# Patient Record
Sex: Male | Born: 1945 | Race: White | Hispanic: No | Marital: Single | Smoking: Never smoker
Health system: Southern US, Community
[De-identification: ages and names within clinical notes are randomized; demographics above are authoritative.]

## PROBLEM LIST (undated history)

## (undated) DIAGNOSIS — I255 Ischemic cardiomyopathy: Secondary | ICD-10-CM

## (undated) DIAGNOSIS — I739 Peripheral vascular disease, unspecified: Secondary | ICD-10-CM

## (undated) DIAGNOSIS — I4821 Permanent atrial fibrillation: Secondary | ICD-10-CM

## (undated) DIAGNOSIS — E785 Hyperlipidemia, unspecified: Secondary | ICD-10-CM

## (undated) DIAGNOSIS — N186 End stage renal disease: Secondary | ICD-10-CM

## (undated) DIAGNOSIS — I5022 Chronic systolic (congestive) heart failure: Secondary | ICD-10-CM

## (undated) DIAGNOSIS — I4891 Unspecified atrial fibrillation: Secondary | ICD-10-CM

## (undated) DIAGNOSIS — E119 Type 2 diabetes mellitus without complications: Secondary | ICD-10-CM

## (undated) DIAGNOSIS — I2510 Atherosclerotic heart disease of native coronary artery without angina pectoris: Secondary | ICD-10-CM

## (undated) DIAGNOSIS — I959 Hypotension, unspecified: Secondary | ICD-10-CM

## (undated) DIAGNOSIS — I2489 Other forms of acute ischemic heart disease: Secondary | ICD-10-CM

## (undated) DIAGNOSIS — I248 Other forms of acute ischemic heart disease: Secondary | ICD-10-CM

## (undated) HISTORY — PX: AV FISTULA REPAIR: SHX563

## (undated) HISTORY — PX: BELOW KNEE LEG AMPUTATION: SUR23

## (undated) HISTORY — PX: TOTAL HIP ARTHROPLASTY: SHX124

## (undated) HISTORY — PX: CHOLECYSTECTOMY: SHX55

---

## 2009-12-03 ENCOUNTER — Emergency Department: Payer: Self-pay | Admitting: Emergency Medicine

## 2012-11-14 ENCOUNTER — Emergency Department: Payer: Self-pay | Admitting: Emergency Medicine

## 2012-11-14 LAB — COMPREHENSIVE METABOLIC PANEL
Albumin: 4 g/dL (ref 3.4–5.0)
Bilirubin,Total: 1.6 mg/dL — ABNORMAL HIGH (ref 0.2–1.0)
Co2: 21 mmol/L (ref 21–32)
Creatinine: 3.99 mg/dL — ABNORMAL HIGH (ref 0.60–1.30)
EGFR (African American): 17 — ABNORMAL LOW
Glucose: 123 mg/dL — ABNORMAL HIGH (ref 65–99)
Osmolality: 289 (ref 275–301)
Potassium: 3.6 mmol/L (ref 3.5–5.1)
SGOT(AST): 22 U/L (ref 15–37)
SGPT (ALT): 29 U/L (ref 12–78)
Total Protein: 7.1 g/dL (ref 6.4–8.2)

## 2012-11-14 LAB — CBC
HGB: 15.2 g/dL (ref 13.0–18.0)
MCHC: 33.8 g/dL (ref 32.0–36.0)
RDW: 15.2 % — ABNORMAL HIGH (ref 11.5–14.5)
WBC: 8.3 10*3/uL (ref 3.8–10.6)

## 2012-11-14 LAB — URINALYSIS, COMPLETE
Bilirubin,UR: NEGATIVE
Glucose,UR: 150 mg/dL (ref 0–75)
Ketone: NEGATIVE
Ph: 6 (ref 4.5–8.0)
Protein: 100
Squamous Epithelial: 1
WBC UR: 3 /HPF (ref 0–5)

## 2012-11-15 LAB — LIPASE, BLOOD: Lipase: 130 U/L (ref 73–393)

## 2017-05-05 ENCOUNTER — Other Ambulatory Visit: Payer: Self-pay | Admitting: Internal Medicine

## 2018-04-24 DIAGNOSIS — N186 End stage renal disease: Secondary | ICD-10-CM | POA: Diagnosis not present

## 2018-04-24 DIAGNOSIS — Z794 Long term (current) use of insulin: Secondary | ICD-10-CM | POA: Insufficient documentation

## 2018-04-24 DIAGNOSIS — Z89512 Acquired absence of left leg below knee: Secondary | ICD-10-CM | POA: Insufficient documentation

## 2018-04-24 DIAGNOSIS — E875 Hyperkalemia: Secondary | ICD-10-CM | POA: Diagnosis not present

## 2018-04-24 DIAGNOSIS — Z992 Dependence on renal dialysis: Secondary | ICD-10-CM | POA: Diagnosis not present

## 2018-04-24 DIAGNOSIS — E1122 Type 2 diabetes mellitus with diabetic chronic kidney disease: Secondary | ICD-10-CM | POA: Insufficient documentation

## 2018-04-24 DIAGNOSIS — Z89511 Acquired absence of right leg below knee: Secondary | ICD-10-CM | POA: Insufficient documentation

## 2018-04-24 DIAGNOSIS — N390 Urinary tract infection, site not specified: Secondary | ICD-10-CM | POA: Diagnosis present

## 2018-04-24 DIAGNOSIS — I4891 Unspecified atrial fibrillation: Secondary | ICD-10-CM | POA: Diagnosis not present

## 2018-04-24 NOTE — ED Triage Notes (Signed)
Pt brought in via ems from [facility].  Pt bil bka.  Pt had dialysis today.  Pt reports dysuria.  No n/v/  Pt alert   Speech clear.

## 2018-04-25 ENCOUNTER — Other Ambulatory Visit: Payer: Self-pay

## 2018-04-25 ENCOUNTER — Encounter: Payer: Self-pay | Admitting: *Deleted

## 2018-04-25 ENCOUNTER — Observation Stay
Admission: EM | Admit: 2018-04-25 | Discharge: 2018-04-25 | Disposition: A | Discharge status: Home / Self Care | Payer: Medicare Other | Attending: Internal Medicine | Admitting: Internal Medicine

## 2018-04-25 DIAGNOSIS — N390 Urinary tract infection, site not specified: Secondary | ICD-10-CM | POA: Diagnosis present

## 2018-04-25 DIAGNOSIS — Z992 Dependence on renal dialysis: Secondary | ICD-10-CM

## 2018-04-25 DIAGNOSIS — N186 End stage renal disease: Secondary | ICD-10-CM

## 2018-04-25 DIAGNOSIS — E875 Hyperkalemia: Secondary | ICD-10-CM | POA: Diagnosis present

## 2018-04-25 DIAGNOSIS — E119 Type 2 diabetes mellitus without complications: Secondary | ICD-10-CM

## 2018-04-25 HISTORY — DX: Type 2 diabetes mellitus without complications: E11.9

## 2018-04-25 HISTORY — DX: End stage renal disease: N18.6

## 2018-04-25 HISTORY — DX: Unspecified atrial fibrillation: I48.91

## 2018-04-25 LAB — GLUCOSE, CAPILLARY
Glucose-Capillary: 78 mg/dL (ref 70–99)
Glucose-Capillary: 98 mg/dL (ref 70–99)

## 2018-04-25 LAB — URINALYSIS, COMPLETE (UACMP) WITH MICROSCOPIC
Bacteria, UA: NONE SEEN
Bilirubin Urine: NEGATIVE
Glucose, UA: 150 mg/dL — AB
Ketones, ur: NEGATIVE mg/dL
Nitrite: NEGATIVE
PH: 9 — AB (ref 5.0–8.0)
Protein, ur: 100 mg/dL — AB
Specific Gravity, Urine: 1.008 (ref 1.005–1.030)
WBC, UA: >50 WBC/hpf — ABNORMAL HIGH (ref 0–5)

## 2018-04-25 LAB — CBC
HCT: 32.9 % — ABNORMAL LOW (ref 39.0–52.0)
Hemoglobin: 10.2 g/dL — ABNORMAL LOW (ref 13.0–17.0)
MCH: 32 pg (ref 26.0–34.0)
MCHC: 31 g/dL (ref 30.0–36.0)
MCV: 103.1 fL — ABNORMAL HIGH (ref 80.0–100.0)
Platelets: 131 10*3/uL — ABNORMAL LOW (ref 150–400)
RBC: 3.19 MIL/uL — ABNORMAL LOW (ref 4.22–5.81)
RDW: 15.7 % — ABNORMAL HIGH (ref 11.5–15.5)
WBC: 7.9 10*3/uL (ref 4.0–10.5)
nRBC: 0 % (ref 0.0–0.2)

## 2018-04-25 LAB — BASIC METABOLIC PANEL
ANION GAP: 11 (ref 5–15)
Anion gap: 11 (ref 5–15)
BUN: 42 mg/dL — ABNORMAL HIGH (ref 8–23)
BUN: 46 mg/dL — ABNORMAL HIGH (ref 8–23)
CALCIUM: 9.4 mg/dL (ref 8.9–10.3)
CO2: 28 mmol/L (ref 22–32)
CO2: 26 mmol/L (ref 22–32)
Calcium: 9.4 mg/dL (ref 8.9–10.3)
Chloride: 99 mmol/L (ref 98–111)
Chloride: 100 mmol/L (ref 98–111)
Creatinine, Ser: 6.85 mg/dL — ABNORMAL HIGH (ref 0.61–1.24)
Creatinine, Ser: 7.04 mg/dL — ABNORMAL HIGH (ref 0.61–1.24)
GFR calc Af Amer: 8 mL/min — ABNORMAL LOW (ref >60)
GFR calc Af Amer: 8 mL/min — ABNORMAL LOW (ref >60)
GFR calc non Af Amer: 7 mL/min — ABNORMAL LOW (ref >60)
GFR, EST NON AFRICAN AMERICAN: 7 mL/min — AB (ref >60)
GLUCOSE: 87 mg/dL (ref 70–99)
Glucose, Bld: 108 mg/dL — ABNORMAL HIGH (ref 70–99)
Potassium: 7.3 mmol/L (ref 3.5–5.1)
Potassium: 7.1 mmol/L (ref 3.5–5.1)
Sodium: 138 mmol/L (ref 135–145)
Sodium: 137 mmol/L (ref 135–145)

## 2018-04-25 LAB — PROTIME-INR
INR: 2.02
Prothrombin Time: 22.6 seconds — ABNORMAL HIGH (ref 11.4–15.2)

## 2018-04-25 LAB — POTASSIUM: Potassium: 4.4 mmol/L (ref 3.5–5.1)

## 2018-04-25 MED ORDER — CEPHALEXIN 500 MG PO CAPS
500.0000 mg | ORAL_CAPSULE | Freq: Every day | ORAL | Status: DC
  Filled 2018-04-25: qty 1

## 2018-04-25 MED ORDER — ACETAMINOPHEN 325 MG PO TABS: 650.0000 mg | ORAL_TABLET | Freq: Four times a day (QID) | ORAL | Status: DC | PRN

## 2018-04-25 MED ORDER — ASPIRIN EC 81 MG PO TBEC: 81.0000 mg | DELAYED_RELEASE_TABLET | Freq: Every day | ORAL | Status: DC

## 2018-04-25 MED ORDER — DEXTROSE 50 % IV SOLN
25.0000 g | Freq: Once | INTRAVENOUS | Status: AC
  Administered 2018-04-25: 25 g via INTRAVENOUS
  Filled 2018-04-25: qty 50

## 2018-04-25 MED ORDER — INSULIN ASPART 100 UNIT/ML ~~LOC~~ SOLN
10.0000 [IU] | Freq: Once | SUBCUTANEOUS | Status: AC
  Administered 2018-04-25: 10 [IU] via INTRAVENOUS
  Filled 2018-04-25: qty 1

## 2018-04-25 MED ORDER — PREGABALIN 25 MG PO CAPS: 25.0000 mg | ORAL_CAPSULE | Freq: Every evening | ORAL | Status: DC

## 2018-04-25 MED ORDER — ONDANSETRON HCL 4 MG PO TABS: 4.0000 mg | ORAL_TABLET | Freq: Four times a day (QID) | ORAL | Status: DC | PRN

## 2018-04-25 MED ORDER — ACETAMINOPHEN 650 MG RE SUPP: 650.0000 mg | Freq: Four times a day (QID) | RECTAL | Status: DC | PRN

## 2018-04-25 MED ORDER — HEPARIN SODIUM (PORCINE) 5000 UNIT/ML IJ SOLN
5000.0000 [IU] | Freq: Three times a day (TID) | INTRAMUSCULAR | Status: DC
  Administered 2018-04-25: 5000 [IU] via SUBCUTANEOUS
  Filled 2018-04-25: qty 1

## 2018-04-25 MED ORDER — CHLORHEXIDINE GLUCONATE CLOTH 2 % EX PADS
6.0000 | MEDICATED_PAD | Freq: Every day | CUTANEOUS | Status: DC
  Filled 2018-04-25 (×2): qty 6

## 2018-04-25 MED ORDER — CEPHALEXIN 500 MG PO CAPS: 500.0000 mg | ORAL_CAPSULE | Freq: Every day | ORAL | 0 refills | Status: DC

## 2018-04-25 MED ORDER — SODIUM CHLORIDE 0.9 % IV SOLN: 1.0000 g | INTRAVENOUS | Status: DC

## 2018-04-25 MED ORDER — WARFARIN SODIUM 5 MG PO TABS
5.0000 mg | ORAL_TABLET | Freq: Once | ORAL | Status: DC
  Filled 2018-04-25: qty 2

## 2018-04-25 MED ORDER — HYDROCODONE-ACETAMINOPHEN 5-325 MG PO TABS: 1.0000 | ORAL_TABLET | Freq: Four times a day (QID) | ORAL | Status: DC | PRN

## 2018-04-25 MED ORDER — INSULIN ASPART 100 UNIT/ML ~~LOC~~ SOLN: 0.0000 [IU] | Freq: Every day | SUBCUTANEOUS | Status: DC

## 2018-04-25 MED ORDER — CEPHALEXIN 500 MG PO CAPS: 500.0000 mg | ORAL_CAPSULE | Freq: Every day | ORAL | 0 refills | Status: AC

## 2018-04-25 MED ORDER — ATORVASTATIN CALCIUM 20 MG PO TABS: 80.0000 mg | ORAL_TABLET | Freq: Every evening | ORAL | Status: DC

## 2018-04-25 MED ORDER — SODIUM CHLORIDE 0.9 % IV SOLN
1.0000 g | Freq: Once | INTRAVENOUS | Status: AC
  Administered 2018-04-25: 1 g via INTRAVENOUS
  Filled 2018-04-25: qty 10

## 2018-04-25 MED ORDER — PATIROMER SORBITEX CALCIUM 8.4 G PO PACK
8.4000 g | PACK | Freq: Every day | ORAL | Status: DC
  Filled 2018-04-25: qty 1

## 2018-04-25 MED ORDER — ONDANSETRON HCL 4 MG/2ML IJ SOLN: 4.0000 mg | Freq: Four times a day (QID) | INTRAMUSCULAR | Status: DC | PRN

## 2018-04-25 MED ORDER — WARFARIN - PHARMACIST DOSING INPATIENT
Freq: Every day | Status: DC
  Filled 2018-04-25: qty 1

## 2018-04-25 MED ORDER — INSULIN ASPART 100 UNIT/ML ~~LOC~~ SOLN: 0.0000 [IU] | Freq: Three times a day (TID) | SUBCUTANEOUS | Status: DC

## 2018-04-25 NOTE — Progress Notes (Signed)
Pre HD assessment    04/25/18 0931  Vital Signs  Temp 98.4 F (36.9 C)  Temp Source Oral  Pulse Rate 64  Pulse Rate Source Monitor  Resp 20  BP (!) 110/52  BP Location Right Arm  BP Method Automatic  Patient Position (if appropriate) Lying  Oxygen Therapy  SpO2 100 %  O2 Device Nasal Cannula  O2 Flow Rate (L/min) 2 L/min  Pain Assessment  Pain Scale 0-10  Pain Score 5  Pain Type Chronic pain  Pain Location Generalized  Pain Intervention(s) RN made aware  Dialysis Weight  Weight 99.8 kg  Type of Weight Pre-Dialysis  Time-Out for Hemodialysis  What Procedure? HD  Pt Identifiers(min of two) First/Last Name;MRN/Account#  Correct Site? Yes  Correct Side? Yes  Correct Procedure? Yes  Consents Verified? Yes  Rad Studies Available? N/A  Safety Precautions Reviewed? Yes  Research scientist (physical sciences)  (2A)  Station Number  (4)  UF/Alarm Test Passed  Conductivity: Meter 14.2  Conductivity: Machine  14.1  pH 7.4  Reverse Osmosis main  Normal Saline Lot Number [id number]  Dialyzer Lot Number [id number]  Disposable Set Lot Number [id number]  Machine Temperature 98.6 F (37 C)  Musician and Audible Yes  Blood Lines Intact and Secured Yes  Pre Treatment Patient Checks  Vascular access used during treatment Fistula  Hepatitis B Surface Antigen Results Negative (unk, labs outdated, HBSAG drawn 04-25-18)  Date Hepatitis B Surface Antigen Drawn 02/21/18  Hepatitis B Surface Antibody  (<10)  Date Hepatitis B Surface Antibody Drawn 11/20/17  Hemodialysis Consent Verified Yes  Hemodialysis Standing Orders Initiated Yes  ECG (Telemetry) Monitor On Yes  Prime Ordered Normal Saline  Length of  DialysisTreatment -hour(s) 3.5 Hour(s)  Dialyzer Elisio 17H NR  Dialysate 1K (1K for 2 hours, then 2K for the rest of HD tx, per MD orders)  Dialysis Anticoagulant None  Dialysate Flow Ordered 800  Blood Flow Rate Ordered 400 mL/min  Ultrafiltration Goal 2 Liters  Pre Treatment  Labs Hepatitis B Surface Antigen;Other (Comment) (BMP)  Dialysis Blood Pressure Support Ordered Normal Saline  Education / Care Plan  Dialysis Education Provided Yes  Documented Education in Care Plan Yes

## 2018-04-25 NOTE — Discharge Instructions (Signed)
Resume out pt HD as before

## 2018-04-25 NOTE — ED Notes (Signed)
PT left via cab per pt request.

## 2018-04-25 NOTE — Discharge Summary (Signed)
[place] [facility] [place] NAME: Antonio Phillips    MR#:  [id number]  DATE OF BIRTH:  [birth year]  DATE OF ADMISSION:  04/25/2018 ADMITTING PHYSICIAN: Fritzi Mandes, MD  DATE OF DISCHARGE: 04/25/2018  PRIMARY CARE PHYSICIAN: Marsh Dolly, MD    ADMISSION DIAGNOSIS:  Hyperkalemia [E87.5] Urinary tract infection without hematuria, site unspecified [N39.0]  DISCHARGE DIAGNOSIS:  Hyperkalemia--s/p Urgent HD--resolved UTI  SECONDARY DIAGNOSIS:   Past Medical History:  Diagnosis Date  . Atrial fibrillation ([place])   . Diabetes ([place])   . ESRD (end stage renal disease) [facility])     HOSPITAL COURSE:  Antonio Phillips  is a 73 y.o. male who presents with chief complaint as above.  Patient presents to the ED with a complaint of dysuria.  He states is been going on for couple of days.  He is a dialysis patient who normally dialyzes on Monday Wednesday Friday.  Here in the ED he was found to have an elevated potassium as well at 7.   # Hyperkalemia -potassium is 7.  He received insulin with D50 and Veltassa in the ED.   -s/p urgent HD--repeat K is 4.4 -appreicate Nephrology input  *  UTI (urinary tract infection) -IV Rocephin given, urine culture sent--change to po Keflex  *  Diabetes ([place]) -sliding scale insulin coverage with carb modified diet   * ESRD on dialysis [facility]) -nephrology consult for dialysis as above  * Afib cont home dose of Warfarin  Overall stable Pt will discharge home Pt is agreeable  CONSULTS OBTAINED:  Treatment Team:  Murlean Iba, MD  DRUG ALLERGIES:  No Known Allergies  DISCHARGE MEDICATIONS:   Allergies as of 04/25/2018   No Known Allergies     Medication List    TAKE these medications   acetaminophen 500 MG tablet Commonly known as:  TYLENOL Take 1,000 mg by mouth every 8 (eight) hours as needed.   aspirin EC 81 MG tablet Take 81 mg by mouth daily.   atorvastatin 80 MG tablet Commonly known as:   LIPITOR Take 80 mg by mouth Nightly.   cephALEXin 500 MG capsule Commonly known as:  KEFLEX Take 1 capsule (500 mg total) by mouth daily for 5 days.   HYDROcodone-acetaminophen 5-325 MG tablet Commonly known as:  NORCO/VICODIN Take 1 tablet by mouth every 6 (six) hours as needed for moderate pain.   midodrine 2.5 MG tablet Commonly known as:  PROAMATINE Take 2.5 mg by mouth 3 (three) times daily with meals.   pregabalin 25 MG capsule Commonly known as:  LYRICA Take 25 mg by mouth Nightly.   warfarin 2.5 MG tablet Commonly known as:  COUMADIN Take 2.5 mg by mouth daily. Take 1 tablet by mouth sat/t/th, take 2 tablets m/w/f/sun       If you experience worsening of your admission symptoms, develop shortness of breath, life threatening emergency, suicidal or homicidal thoughts you must seek medical attention immediately by calling 911 or calling your MD immediately  if symptoms less severe.  You Must read complete instructions/literature along with all the possible adverse reactions/side effects for all the Medicines you take and that have been prescribed to you. Take any new Medicines after you have completely understood and accept all the possible adverse reactions/side effects.   Please note  You were cared for by a hospitalist during your hospital stay. If you have any questions about your discharge medications or the care you received while you were in the  hospital after you are discharged, you can call the unit and asked to speak with the hospitalist on call if the hospitalist that took care of you is not available. Once you are discharged, your primary care physician will handle any further medical issues. Please note that NO REFILLS for any discharge medications will be authorized once you are discharged, as it is imperative that you return to your primary care physician (or establish a relationship with a primary care physician if you do not have one) for your aftercare needs so  that they can reassess your need for medications and monitor your lab values. Today   SUBJECTIVE   Doing well. Wants to go home  VITAL SIGNS:  Blood pressure 121/60, pulse 68, temperature 97.8 F (36.6 C), temperature source Oral, resp. rate 14, height 5\' 11"  (1.803 m), weight 97.7 kg, SpO2 99 %.  I/O:    Intake/Output Summary (Last 24 hours) at 04/25/2018 1653 Last data filed at 04/25/2018 1332 Gross per 24 hour  Intake 100 ml  Output 2703 ml  Net -2603 ml    PHYSICAL EXAMINATION:  GENERAL:  73 y.o.-year-old patient lying in the bed with no acute distress. obese EYES: Pupils equal, round, reactive to light and accommodation. No scleral icterus. Extraocular muscles intact.  HEENT: Head atraumatic, normocephalic. Oropharynx and nasopharynx clear.  NECK:  Supple, no jugular venous distention. No thyroid enlargement, no tenderness.  LUNGS: Normal breath sounds bilaterally, no wheezing, rales,rhonchi or crepitation. No use of accessory muscles of respiration.  CARDIOVASCULAR: S1, S2 normal. No murmurs, rubs, or gallops.  ABDOMEN: Soft, non-tender, non-distended. Bowel sounds present. No organomegaly or mass.  EXTREMITIES: No pedal edema, cyanosis, or clubbing.  NEUROLOGIC: Cranial nerves II through XII are intact. Muscle strength 5/5 in all extremities. Sensation intact. Gait not checked.  PSYCHIATRIC: The patient is alert and oriented x 3.  SKIN: No obvious rash, lesion, or ulcer.   DATA REVIEW:   CBC  Recent Labs  Lab 04/25/18 0259  WBC 7.9  HGB 10.2*  HCT 32.9*  PLT 131*    Chemistries  Recent Labs  Lab 04/25/18 0959 04/25/18 1510  NA 137  --   K 7.1* 4.4  CL 100  --   CO2 26  --   GLUCOSE 87  --   BUN 46*  --   CREATININE 7.04*  --   CALCIUM 9.4  --     Microbiology Results   No results found for this or any previous visit (from the past 240 hour(s)).  RADIOLOGY:  No results found.   CODE STATUS:     Code Status Orders  (From admission, onward)          Start     Ordered   04/25/18 0737  Full code  Continuous     04/25/18 0736        Code Status History    This patient has a current code status but no historical code status.      TOTAL TIME TAKING CARE OF THIS PATIENT: 40 minutes.    Fritzi Mandes M.D on 04/25/2018 at 4:53 PM  Between 7am to 6pm - Pager - [phone] After 6pm go to [email or web address] - password [email or web address]  Office  [phone]  CC: Primary care physician; Marsh Dolly, MD

## 2018-04-25 NOTE — Progress Notes (Signed)
Patient ID: Antonio Phillips, male   DOB: 1945-07-10, 73 y.o.   MRN: [id number] Pt seen and chart reviewed. Doing well. K 7.1 before HD Feels fine and wants to go home. Repeat K pending D/w dr Dierdre Highman. Will consider d/c home depending on K level

## 2018-04-25 NOTE — Progress Notes (Signed)
Pre HD assessment    04/25/18 0932  Neurological  Level of Consciousness Alert  Orientation Level Oriented X4  Respiratory  Respiratory Pattern Regular;Unlabored  Chest Assessment Chest expansion symmetrical  Cardiac  Pulse Irregular  ECG Monitor Yes  Cardiac Rhythm Atrial fibrillation;SB  Vascular  R Radial Pulse +2  L Radial Pulse +2  Edema Generalized  Integumentary  Integumentary (WDL) X  Skin Color Appropriate for ethnicity  Musculoskeletal  Musculoskeletal (WDL) X  Generalized Weakness Yes  Assistive Device None  GU Assessment  Genitourinary (WDL) X  Genitourinary Symptoms  (HD)  Psychosocial  Psychosocial (WDL) WDL

## 2018-04-25 NOTE — ED Notes (Signed)
Patient given orange juice to drink.

## 2018-04-25 NOTE — ED Notes (Signed)
[facility]  CALLED  FOR  TRANSPORT 

## 2018-04-25 NOTE — Progress Notes (Signed)
Post HD assessment. Pt tolerated tx well without c/o or complication. Net UF 2503, goal met.    04/25/18 1332  Vital Signs  Temp 97.8 F (36.6 C)  Temp Source Oral  Pulse Rate 69  Pulse Rate Source Monitor  Resp 14  BP 121/60  BP Location Right Arm  BP Method Automatic  Patient Position (if appropriate) Lying  Oxygen Therapy  SpO2 99 %  O2 Device Nasal Cannula  O2 Flow Rate (L/min) 2 L/min  Dialysis Weight  Weight 97.7 kg  Type of Weight Post-Dialysis  Post-Hemodialysis Assessment  Rinseback Volume (mL) 250 mL  KECN 76.4 V  Dialyzer Clearance Lightly streaked  Duration of HD Treatment -hour(s) 3.5 hour(s)  Hemodialysis Intake (mL) 500 mL  UF Total -Machine (mL) 3003 mL  Net UF (mL) 2503 mL  Tolerated HD Treatment Yes  AVG/AVF Arterial Site Held (minutes) 10 minutes  AVG/AVF Venous Site Held (minutes) 10 minutes  Education / Care Plan  Dialysis Education Provided Yes  Documented Education in Care Plan Yes

## 2018-04-25 NOTE — ED Provider Notes (Addendum)
[facility] Emergency Department Provider Note  Time seen: 3:01 AM  I have reviewed the triage vital signs and the nursing notes.   HISTORY  Chief Complaint Dysuria    HPI Antonio Phillips is a 73 y.o. male with a past medical history of ESRD on hemodialysis Monday/Wednesday/Friday, bilateral BKA, hypertension, presents to the emergency department with dysuria.  According to the patient over the past 3 weeks he has been experiencing burning upon urination.  States that is worsened over the past 24 hours which is why he came to the emergency department tonight.  Patient denies any fever, nausea or vomiting.  Patient states he urinates several times per day, estimates approximately "1 quart" of urine production per day.  Denies any abdominal pain or back pain.   No past medical history on file.  There are no active problems to display for this patient.    Prior to Admission medications   Not on File    No Known Allergies  No family history on file.  Social History Social History   Tobacco Use  . Smoking status: Never Smoker  . Smokeless tobacco: Never Used  Substance Use Topics  . Alcohol use: Never    Frequency: Never  . Drug use: Never    Review of Systems Constitutional: Negative for fever. Cardiovascular: Negative for chest pain. Respiratory: Negative for shortness of breath. Gastrointestinal: Negative for abdominal pain, vomiting Genitourinary: Dysuria x3 weeks worse over the past 24 hours. Musculoskeletal: Negative for musculoskeletal complaints Skin: Negative for skin complaints  Neurological: Negative for headache All other ROS negative  ____________________________________________   PHYSICAL EXAM:  VITAL SIGNS: ED Triage Vitals  Enc Vitals Group     BP 04/24/18 2359 (!) 152/72     Pulse Rate 04/24/18 2359 90     Resp 04/24/18 2359 20     Temp 04/24/18 2359 98.4 F (36.9 C)     Temp Source 04/24/18 2359 Oral     SpO2  04/24/18 2359 96 %     Weight 04/25/18 0001 220 lb (99.8 kg)     Height 04/25/18 0001 5\' 11"  (1.803 m)     Head Circumference --      Peak Flow --      Pain Score 04/25/18 0001 3     Pain Loc --      Pain Edu? --      Excl. in [place]? --    Constitutional: Alert and oriented. Well appearing and in no distress. Eyes: Normal exam ENT   Head: Normocephalic and atraumatic.   Mouth/Throat: Mucous membranes are moist. Cardiovascular: Normal rate, regular rhythm. Respiratory: Normal respiratory effort without tachypnea nor retractions. Breath sounds are clear Gastrointestinal: Soft and nontender. No distention.  GU: Uncircumcised penis, no discharge upon foreskin retraction.  No testicular tenderness or edema. Musculoskeletal: Bilateral BKA's. Neurologic:  Normal speech and language. No gross focal neurologic deficits Skin:  Skin is warm, dry and intact.  Psychiatric: Mood and affect are normal.  ____________________________________________   EKG viewed and interpreted by myself shows a normal sinus rhythm at 56 bpm with a narrow QRS, normal axis, largely normal intervals, no concerning ST changes.  INITIAL IMPRESSION / ASSESSMENT AND PLAN / ED COURSE  Pertinent labs & imaging results that were available during my care of the patient were reviewed by me and considered in my medical decision making (see chart for details).  Patient presents to the emergency department for dysuria ongoing x3 weeks although worse over  the past 24 hours.  Reassuringly patient is afebrile with otherwise normal vitals.  GU exam is nonrevealing.  Patient's urinalysis is consistent with urinary tract infection.  As the patient is on dialysis we will check basic labs and dose IV Rocephin.  Anticipate likely discharge on Keflex.  Urine culture has been sent.  Overall patient appears very well.  States he received his full Monday dialysis session and has dialysis scheduled for later today.   Patient's labs have  resulted with a potassium of 7.3.  Patient's potassium has resulted at 7.3.  EKG performed shows no changes.  I discussed patient with Dr. Candiss Norse of nephrology who recommends treating with insulin glucose, Drue Stager has a.  We will admit to the hospitalist service and they will perform dialysis first thing in the morning.  Patient agreeable to plan of care.  CRITICAL CARE Performed by: Harvest Dark   Total critical care time: 30 minutes  Critical care time was exclusive of separately billable procedures and treating other patients.  Critical care was necessary to treat or prevent imminent or life-threatening deterioration.  Critical care was time spent personally by me on the following activities: development of treatment plan with patient and/or surrogate as well as nursing, discussions with consultants, evaluation of patient's response to treatment, examination of patient, obtaining history from patient or surrogate, ordering and performing treatments and interventions, ordering and review of laboratory studies, ordering and review of radiographic studies, pulse oximetry and re-evaluation of patient's condition.   ____________________________________________   FINAL CLINICAL IMPRESSION(S) / ED DIAGNOSES  Urinary tract infection Hyperkalemia   Harvest Dark, MD 04/25/18 2902    Harvest Dark, MD 04/25/18 [phone]

## 2018-04-25 NOTE — Progress Notes (Signed)
Post HD assessment    04/25/18 1332  Neurological  Level of Consciousness Alert  Orientation Level Oriented X4  Respiratory  Respiratory Pattern Regular;Unlabored  Chest Assessment Chest expansion symmetrical  Cardiac  Pulse Irregular  ECG Monitor Yes  Cardiac Rhythm Atrial fibrillation  Vascular  R Radial Pulse +2  L Radial Pulse +2  Edema Generalized  Integumentary  Integumentary (WDL) X  Skin Color Appropriate for ethnicity  Musculoskeletal  Musculoskeletal (WDL) X  Generalized Weakness Yes  Assistive Device None  GU Assessment  Genitourinary (WDL) X  Genitourinary Symptoms  (HD)  Psychosocial  Psychosocial (WDL) WDL

## 2018-04-25 NOTE — ED Notes (Signed)
Date and time results received: 04/25/18 11am (use smartphrase ".now" to insert current time)  Test: Potassium  Critical Value: 7.1  Name of Provider Notified: Dr. Posey Pronto and Dr. Candiss Norse  Orders Received? Or Actions Taken?: Dr. Candiss Norse stated patient is on low potassium dialysis due to high potassium.  Dr. Posey Pronto acknowledged.

## 2018-04-25 NOTE — Consult Note (Signed)
ANTICOAGULATION CONSULT NOTE - Follow Up Consult  Pharmacy Consult for Warfarin Dosing Indication: atrial fibrillation  No Known Allergies  Patient Measurements: Height: 5\' 11"  (180.3 cm) Weight: 215 lb 6.2 oz (97.7 kg) IBW/kg (Calculated) : 75.3  Vital Signs: Temp: 97.8 F (36.6 C) (02/19 1332) Temp Source: Oral (02/19 1332) BP: 121/60 (02/19 1332) Pulse Rate: 68 (02/19 1335)  Labs: Recent Labs    04/25/18 0259 04/25/18 0959 04/25/18 1510  HGB 10.2*  --   --   HCT 32.9*  --   --   PLT 131*  --   --   LABPROT  --   --  22.6*  INR  --   --  2.02  CREATININE 6.85* 7.04*  --     Estimated Creatinine Clearance: 11.1 mL/min (A) (by C-G formula based on SCr of 7.04 mg/dL (H)).   Assessment: Pharmacy consulted for warfarin dosing and monitoring for 73 yo male with PMH of atrial fibrillation. Patient was taking warfarin PTA. INR therapeutic @ 2.02 on admission.   Home Regimen: warfarin 2.5mg : Tues, Thurs, Sat                             Warfarin 5mg : Mon, Wed, Fri, Sun  DATE INR DOSE 2/19 2.02  Goal of Therapy:  INR 2-3 Monitor platelets by anticoagulation protocol: Yes   Plan:  2/19 Will order patient's home dose of warfarin 5mg  x 1 dose tonight.  Will order INR with AM labs and continue to follow.   Pernell Dupre, PharmD, BCPS Clinical Pharmacist 04/25/2018 3:46 PM

## 2018-04-25 NOTE — Progress Notes (Signed)
HD tx start    04/25/18 0943  Vital Signs  Pulse Rate 64  Pulse Rate Source Monitor  Resp (!) 22  BP 130/63  BP Location Right Arm  BP Method Automatic  Patient Position (if appropriate) Lying  Oxygen Therapy  SpO2 100 %  O2 Device Nasal Cannula  O2 Flow Rate (L/min) 2 L/min  During Hemodialysis Assessment  Blood Flow Rate (mL/min) 400 mL/min  Arterial Pressure (mmHg) -180 mmHg  Venous Pressure (mmHg) 190 mmHg  Transmembrane Pressure (mmHg) 60 mmHg  Ultrafiltration Rate (mL/min) 860 mL/min  Dialysate Flow Rate (mL/min) 800 ml/min  Conductivity: Machine  14  HD Safety Checks Performed Yes  Dialysis Fluid Bolus Normal Saline  Bolus Amount (mL) 250 mL  Intra-Hemodialysis Comments Tx initiated

## 2018-04-25 NOTE — H&P (Signed)
[facility] [place] NAME: Antonio Phillips    MR#:  [id number]  DATE OF BIRTH:  [birth year]  DATE OF ADMISSION:  04/25/2018  PRIMARY CARE PHYSICIAN: Marsh Dolly, MD   REQUESTING/REFERRING PHYSICIAN: Kerman Passey, MD  CHIEF COMPLAINT:   Chief Complaint  Patient presents with  . Dysuria    HISTORY OF PRESENT ILLNESS:  Antonio Phillips  is a 73 y.o. male who presents with chief complaint as above.  Patient presents to the ED with a complaint of dysuria.  He states is been going on for couple of days.  He is a dialysis patient who normally dialyzes on Monday Wednesday Friday.  Here in the ED he was found to have an elevated potassium as well at 7.  UA is also concerning for UTI.  He was started on antibiotics.  Nephrology was contacted by ED physician about his elevated potassium and they recommended he be admitted for dialysis here.  Hospitalist were called for admission  PAST MEDICAL HISTORY:   Past Medical History:  Diagnosis Date  . Atrial fibrillation ([place])   . Diabetes ([place])   . ESRD (end stage renal disease) ([place])      PAST SURGICAL HISTORY:   Past Surgical History:  Procedure Laterality Date  . BELOW KNEE LEG AMPUTATION Bilateral      SOCIAL HISTORY:   Social History   Tobacco Use  . Smoking status: Never Smoker  . Smokeless tobacco: Never Used  Substance Use Topics  . Alcohol use: Never    Frequency: Never     FAMILY HISTORY:    Family history reviewed and is non-contributory DRUG ALLERGIES:  No Known Allergies  MEDICATIONS AT HOME:   Prior to Admission medications   Not on File    REVIEW OF SYSTEMS:  Review of Systems  Constitutional: Negative for chills, fever, malaise/fatigue and weight loss.  HENT: Negative for ear pain, hearing loss and tinnitus.   Eyes: Negative for blurred vision, double vision, pain and redness.  Respiratory: Negative for cough, hemoptysis and shortness of breath.    Cardiovascular: Negative for chest pain, palpitations, orthopnea and leg swelling.  Gastrointestinal: Negative for abdominal pain, constipation, diarrhea, nausea and vomiting.  Genitourinary: Positive for dysuria and frequency. Negative for hematuria.  Musculoskeletal: Negative for back pain, joint pain and neck pain.  Skin:       No acne, rash, or lesions  Neurological: Negative for dizziness, tremors, focal weakness and weakness.  Endo/Heme/Allergies: Negative for polydipsia. Does not bruise/bleed easily.  Psychiatric/Behavioral: Negative for depression. The patient is not nervous/anxious and does not have insomnia.      VITAL SIGNS:   Vitals:   04/25/18 0515 04/25/18 0530 04/25/18 0600 04/25/18 0615  BP: 114/67 125/80 (!) 107/42 119/64  Pulse: (!) 53 (!) 56 (!) 56 (!) 57  Resp: 17 (!) 21 15 18   Temp:      TempSrc:      SpO2: (!) 87% 100% 100% 100%  Weight:      Height:       Wt Readings from Last 3 Encounters:  04/25/18 99.8 kg    PHYSICAL EXAMINATION:  Physical Exam  Vitals reviewed. Constitutional: He is oriented to person, place, and time. He appears well-developed and well-nourished. No distress.  HENT:  Head: Normocephalic and atraumatic.  Mouth/Throat: Oropharynx is clear and moist.  Eyes: Pupils are equal, round, and reactive to light. Conjunctivae and EOM are normal. No scleral icterus.  Neck: Normal range  of motion. Neck supple. No JVD present. No thyromegaly present.  Cardiovascular: Normal rate, regular rhythm and intact distal pulses. Exam reveals no gallop and no friction rub.  No murmur heard. Respiratory: Effort normal and breath sounds normal. No respiratory distress. He has no wheezes. He has no rales.  GI: Soft. Bowel sounds are normal. He exhibits no distension. There is no abdominal tenderness.  Musculoskeletal: Normal range of motion.        General: Deformity (Bilateral BKA) present. No edema.     Comments: No arthritis, no gout  Lymphadenopathy:     He has no cervical adenopathy.  Neurological: He is alert and oriented to person, place, and time. No cranial nerve deficit.  No dysarthria, no aphasia  Skin: Skin is warm and dry. No rash noted. No erythema.  Psychiatric: He has a normal mood and affect. His behavior is normal. Judgment and thought content normal.    LABORATORY PANEL:   CBC Recent Labs  Lab 04/25/18 0259  WBC 7.9  HGB 10.2*  HCT 32.9*  PLT 131*   ------------------------------------------------------------------------------------------------------------------  Chemistries  Recent Labs  Lab 04/25/18 0259  NA 138  K 7.3*  CL 99  CO2 28  GLUCOSE 108*  BUN 42*  CREATININE 6.85*  CALCIUM 9.4   ------------------------------------------------------------------------------------------------------------------  Cardiac Enzymes No results for input(s): TROPONINI in the last 168 hours. ------------------------------------------------------------------------------------------------------------------  RADIOLOGY:  No results found.  EKG:   Orders placed or performed during the hospital encounter of 04/25/18  . ED EKG  . ED EKG    IMPRESSION AND PLAN:  Principal Problem:   Hyperkalemia -potassium is 7.  He received insulin with D50 and [place] in the ED.  Plan is for dialysis.  Nephrology consult for the same, see below Active Problems:   UTI (urinary tract infection) -IV Rocephin given, urine culture sent   Diabetes ([place]) -sliding scale insulin coverage with carb modified diet   ESRD on dialysis [facility]) -nephrology consult for dialysis as above  Chart review performed and case discussed with ED provider. Labs, imaging and/or ECG reviewed by provider and discussed with patient/family. Management plans discussed with the patient and/or family.  DVT PROPHYLAXIS: SubQ heparin  GI PROPHYLAXIS:  None  ADMISSION STATUS: Observation  CODE STATUS: Full  TOTAL TIME TAKING CARE OF THIS PATIENT: 40  minutes.   Antonio Phillips 04/25/2018, 6:41 AM  [facility]  Office  [phone]  CC: Primary care physician; Marsh Dolly, MD  Note:  This document was prepared using Dragon voice recognition software and may include unintentional dictation errors.

## 2018-04-25 NOTE — Consult Note (Signed)
Date: 04/25/2018                  Patient Name:  Antonio Phillips  MRN: [id number]  DOB: [birth year]  Age / Sex: 73 y.o., male         PCP: Marsh Dolly, MD                 Service Requesting Consult: IM/ Lance Coon, MD                 Reason for Consult: Hyperalemia, ESRD            History of Present Illness: Patient is a 73 y.o. male with medical problems of ESRD, who was admitted to [facility] on 04/25/2018 for evaluation of dysuria. Patient is found to have severe hyperkalemia. Admitted for urgent HD. Dysuria x 3 weeks. Worse now. Came in for evaluation  No fever or chills. U/a suggestive of UTI   Medications: Outpatient medications: (Not in a hospital admission)   Current medications: Current Facility-Administered Medications  Medication Dose Route Frequency Provider Last Rate Last Dose  . acetaminophen (TYLENOL) tablet 650 mg  650 mg Oral Q6H PRN Lance Coon, MD       Or  . acetaminophen (TYLENOL) suppository 650 mg  650 mg Rectal Q6H PRN Lance Coon, MD      . Derrill Memo ON 04/26/2018] cefTRIAXone (ROCEPHIN) 1 g in sodium chloride 0.9 % 100 mL IVPB  1 g Intravenous Q24H Lance Coon, MD      . heparin injection 5,000 Units  5,000 Units Subcutaneous Camelia Phenes Lance Coon, MD   5,000 Units at 04/25/18 [phone]  . insulin aspart (novoLOG) injection 0-5 Units  0-5 Units Subcutaneous QHS Lance Coon, MD      . insulin aspart (novoLOG) injection 0-9 Units  0-9 Units Subcutaneous TID WC Lance Coon, MD      . ondansetron [facility]) tablet 4 mg  4 mg Oral Q6H PRN Lance Coon, MD       Or  . ondansetron [facility]) injection 4 mg  4 mg Intravenous Q6H PRN Lance Coon, MD      . patiromer Daryll Drown) packet 8.4 g  8.4 g Oral Daily Lance Coon, MD       No current outpatient medications on file.      Allergies: No Known Allergies    Past Medical History: Past Medical History:  Diagnosis Date  . Atrial fibrillation ([place])   . Diabetes ([place])   . ESRD (end stage renal disease)  [facility])      Past Surgical History: Past Surgical History:  Procedure Laterality Date  . BELOW KNEE LEG AMPUTATION Bilateral      Family History: No family history on file.   Social History: Social History   Socioeconomic History  . Marital status: Divorced    Spouse name: Not on file  . Number of children: Not on file  . Years of education: Not on file  . Highest education level: Not on file  Occupational History  . Not on file  Social Needs  . Financial resource strain: Not on file  . Food insecurity:    Worry: Not on file    Inability: Not on file  . Transportation needs:    Medical: Not on file    Non-medical: Not on file  Tobacco Use  . Smoking status: Never Smoker  . Smokeless tobacco: Never Used  Substance and Sexual Activity  . Alcohol use: Never    Frequency: Never  .  Drug use: Never  . Sexual activity: Not on file  Lifestyle  . Physical activity:    Days per week: Not on file    Minutes per session: Not on file  . Stress: Not on file  Relationships  . Social connections:    Talks on phone: Not on file    Gets together: Not on file    Attends religious service: Not on file    Active member of club or organization: Not on file    Attends meetings of clubs or organizations: Not on file    Relationship status: Not on file  . Intimate partner violence:    Fear of current or ex partner: Not on file    Emotionally abused: Not on file    Physically abused: Not on file    Forced sexual activity: Not on file  Other Topics Concern  . Not on file  Social History Narrative  . 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  No complaints  Vital Signs: Blood pressure 127/60, pulse (!) 57, temperature 98.4 F (36.9 C), temperature source Oral, resp. rate 15, height 5\' 11"  (1.803 m), weight 99.8 kg, SpO2 100 %.   Intake/Output Summary (Last 24 hours) at 04/25/2018 6270 Last data filed at 04/25/2018 3500 Gross per 24 hour  Intake 100 ml  Output 200 ml  Net -100 ml    Weight trends: [employer]   04/25/18 0001  Weight: 99.8 kg    Physical Exam: General:  Obese gentleman, laying in the bed  HEENT  anicteric, moist oral mucous membranes  Neck:  Short, thick  Lungs:  Clear to auscultation, oxygen by nasal cannula  Heart::  Irregular rhythm  Abdomen:  Soft, obese, nontender  Extremities:  Bilateral BKA  Neurologic:  Alert, oriented  Skin:  No acute rashes   Access: left forearm AV fistula    Lab results: Basic Metabolic Panel: Recent Labs  Lab 04/25/18 0259  NA 138  K 7.3*  CL 99  CO2 28  GLUCOSE 108*  BUN 42*  CREATININE 6.85*  CALCIUM 9.4    Liver Function Tests: No results for input(s): AST, ALT, ALKPHOS, BILITOT, PROT, ALBUMIN in the last 168 hours. No results for input(s): LIPASE, AMYLASE in the last 168 hours. No results for input(s): AMMONIA in the last 168 hours.  CBC: Recent Labs  Lab 04/25/18 0259  WBC 7.9  HGB 10.2*  HCT 32.9*  MCV 103.1*  PLT 131*    Cardiac Enzymes: No results for input(s): CKTOTAL, TROPONINI in the last 168 hours.  BNP: Invalid input(s): POCBNP  CBG: Recent Labs  Lab 04/25/18 0754  GLUCAP 78    Microbiology: No results found for this or any previous visit (from the past 720 hour(s)).   Coagulation Studies: No results for input(s): LABPROT, INR in the last 72 hours.  Urinalysis: Recent Labs    04/25/18 0003  COLORURINE STRAW*  LABSPEC 1.008  PHURINE 9.0*  GLUCOSEU 150*  HGBUR SMALL*  BILIRUBINUR NEGATIVE  KETONESUR NEGATIVE  PROTEINUR 100*  NITRITE NEGATIVE  LEUKOCYTESUR  LARGE*  Imaging:  No results found.   Assessment & Plan: Pt is a 73 y.o. 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   [facility]/left arm AV fistula/MWF  Severe hyperkalemia.   No EKG peaked T waves but sinus bradycardia noted.  Given shifting measures in ER Urgent hemodialysis to be arranged this morning Outpatient records requested from [place]  End-stage renal disease Dialysis treatment today  Follow MWF schedule  Anemia of chronic kidney disease Hemoglobin 10.2.  Continue to monitor.  Secondary hyperparathyroidism Calcium 9.4. Patient is not on any phosphorus binders as outpatient  Social situation Patient reports that he does not have any family members.  He is currently living in a motel.  Due to being wheelchair-bound, he has difficulty finding accommodation.  Consider social worker evaluation to see if any assistance can be provided.     LOS: 0 Britaney Espaillat 2/19/20208:37 AM  [place], [place]  Note: This note was prepared with Dragon dictation. Any transcription errors are unintentional

## 2018-04-25 NOTE — Progress Notes (Signed)
HD tx end    04/25/18 1326  Vital Signs  Pulse Rate 68  Pulse Rate Source Monitor  Resp 14  BP (!) 131/56  BP Location Right Arm  BP Method Automatic  Patient Position (if appropriate) Lying  Oxygen Therapy  SpO2 100 %  O2 Device Nasal Cannula  O2 Flow Rate (L/min) 2 L/min  During Hemodialysis Assessment  Dialysis Fluid Bolus Normal Saline  Bolus Amount (mL) 250 mL  Intra-Hemodialysis Comments Tx completed

## 2018-04-26 LAB — URINE CULTURE: Culture: NO GROWTH

## 2018-04-26 LAB — HEPATITIS B SURFACE ANTIGEN: Hepatitis B Surface Ag: NEGATIVE

## 2018-04-26 NOTE — ED Notes (Signed)
Pt reports that he would rather take a cab home than wait for EMS any longer. Pt has arranged for and employee at [facility] to assist him into his personal wheelchair when he arrives. Charge RN, Larene Beach, has been notified and EMS has request has been canceled. [place] taxi has been contacted and in en route to department.

## 2018-04-26 NOTE — Care Management (Signed)
Post ED discharge note entry:  RNCM received call from [employer] requesting assistance with medications at 1800 on 04/25/2018. Team had already left for the day.  Patient is bilateral amputee and obtains his medications from [facility]. (It is noted that patient went back to [facility] by [facility] although I was informed that he would need EMS).  At that hour of the day, I suggested that ED RN reach out to [facility] for Keflex to be filled in house as I was told it would obviously be too difficult for patient to go to pharmacy and pick up medications.

## 2018-04-26 NOTE — ED Notes (Signed)
Report received from [facility] and [place], RN's. Reported that pt is no longer being admitted and is up for d/c. Bill, RN has provided pt with Keflex Rx from in house pharmacy for a 5 day course and pt has verbalized understanding of medication. Pt currently awaits EMS transport back to [place]. Pt has been updated on POC.

## 2018-04-26 NOTE — ED Notes (Signed)
Pt verbalized understanding of d/c instructions, rx and f/u care. No further questions at this time. Pt assisted to the exit via wheelchair to await taxi home. Unable to complete e-signature due to pt placement in hallway bed, paper signature has been obtained.

## 2018-04-26 NOTE — Progress Notes (Signed)
04/25/18 Clinical Social Worker (CSW) received call from RN on 2/19 stating that patient can't get his Keflex filled until next week when he goes to the [place]. Per patient he lives at a hotel in [place] and has no friends or family that can transport him. Per patient he relies on [place] transportation to his apportionments and dialysis. CSW contacted ED RN case manager and lead RN case Freight forward[employer]. Lead RN case manager spoke with RN and suggested to for patient to get his Keflex from the [place]. Please reconsult if future social work needs arise. CSW signing off.   [employer], LCSW [phone]

## 2018-07-31 ENCOUNTER — Emergency Department
Admission: EM | Admit: 2018-07-31 | Discharge: 2018-07-31 | Disposition: A | Discharge status: Home / Self Care | Payer: Medicare Other | Attending: Emergency Medicine | Admitting: Emergency Medicine

## 2018-07-31 ENCOUNTER — Encounter: Payer: Self-pay | Admitting: Emergency Medicine

## 2018-07-31 ENCOUNTER — Other Ambulatory Visit: Payer: Self-pay

## 2018-07-31 DIAGNOSIS — T8789 Other complications of amputation stump: Secondary | ICD-10-CM | POA: Insufficient documentation

## 2018-07-31 DIAGNOSIS — Y828 Other medical devices associated with adverse incidents: Secondary | ICD-10-CM | POA: Diagnosis not present

## 2018-07-31 DIAGNOSIS — Z89512 Acquired absence of left leg below knee: Secondary | ICD-10-CM | POA: Insufficient documentation

## 2018-07-31 DIAGNOSIS — T82838A Hemorrhage of vascular prosthetic devices, implants and grafts, initial encounter: Secondary | ICD-10-CM | POA: Diagnosis present

## 2018-07-31 DIAGNOSIS — N186 End stage renal disease: Secondary | ICD-10-CM | POA: Insufficient documentation

## 2018-07-31 DIAGNOSIS — Z89511 Acquired absence of right leg below knee: Secondary | ICD-10-CM | POA: Diagnosis not present

## 2018-07-31 DIAGNOSIS — M79609 Pain in unspecified limb: Secondary | ICD-10-CM | POA: Insufficient documentation

## 2018-07-31 DIAGNOSIS — Z7982 Long term (current) use of aspirin: Secondary | ICD-10-CM | POA: Diagnosis not present

## 2018-07-31 DIAGNOSIS — Z79899 Other long term (current) drug therapy: Secondary | ICD-10-CM | POA: Diagnosis not present

## 2018-07-31 DIAGNOSIS — Z7901 Long term (current) use of anticoagulants: Secondary | ICD-10-CM | POA: Diagnosis not present

## 2018-07-31 DIAGNOSIS — E1122 Type 2 diabetes mellitus with diabetic chronic kidney disease: Secondary | ICD-10-CM | POA: Diagnosis not present

## 2018-07-31 LAB — COMPREHENSIVE METABOLIC PANEL
ALT: 23 U/L (ref 0–44)
AST: 14 U/L — ABNORMAL LOW (ref 15–41)
Albumin: 3.5 g/dL (ref 3.5–5.0)
Alkaline Phosphatase: 68 U/L (ref 38–126)
Anion gap: 14 (ref 5–15)
BUN: 32 mg/dL — ABNORMAL HIGH (ref 8–23)
CO2: 27 mmol/L (ref 22–32)
Calcium: 8.5 mg/dL — ABNORMAL LOW (ref 8.9–10.3)
Chloride: 97 mmol/L — ABNORMAL LOW (ref 98–111)
Creatinine, Ser: 5.47 mg/dL — ABNORMAL HIGH (ref 0.61–1.24)
GFR calc Af Amer: 11 mL/min — ABNORMAL LOW (ref >60)
GFR calc non Af Amer: 10 mL/min — ABNORMAL LOW (ref >60)
Glucose, Bld: 205 mg/dL — ABNORMAL HIGH (ref 70–99)
Potassium: 3.5 mmol/L (ref 3.5–5.1)
Sodium: 138 mmol/L (ref 135–145)
Total Bilirubin: 0.7 mg/dL (ref 0.3–1.2)
Total Protein: 7.3 g/dL (ref 6.5–8.1)

## 2018-07-31 LAB — CBC WITH DIFFERENTIAL/PLATELET
Abs Immature Granulocytes: 0.08 10*3/uL — ABNORMAL HIGH (ref 0.00–0.07)
Basophils Absolute: 0 10*3/uL (ref 0.0–0.1)
Basophils Relative: 0 %
Eosinophils Absolute: 0.1 10*3/uL (ref 0.0–0.5)
Eosinophils Relative: 2 %
HCT: 30.7 % — ABNORMAL LOW (ref 39.0–52.0)
Hemoglobin: 9.7 g/dL — ABNORMAL LOW (ref 13.0–17.0)
Immature Granulocytes: 1 %
Lymphocytes Relative: 15 %
Lymphs Abs: 1.3 10*3/uL (ref 0.7–4.0)
MCH: 31.4 pg (ref 26.0–34.0)
MCHC: 31.6 g/dL (ref 30.0–36.0)
MCV: 99.4 fL (ref 80.0–100.0)
Monocytes Absolute: 0.6 10*3/uL (ref 0.1–1.0)
Monocytes Relative: 7 %
Neutro Abs: 6.6 10*3/uL (ref 1.7–7.7)
Neutrophils Relative %: 75 %
Platelets: 182 10*3/uL (ref 150–400)
RBC: 3.09 MIL/uL — ABNORMAL LOW (ref 4.22–5.81)
RDW: 15.9 % — ABNORMAL HIGH (ref 11.5–15.5)
WBC: 8.8 10*3/uL (ref 4.0–10.5)
nRBC: 0 % (ref 0.0–0.2)

## 2018-07-31 MED ORDER — LIDOCAINE HCL (PF) 1 % IJ SOLN
INTRAMUSCULAR | Status: AC
  Administered 2018-07-31: 5 mL
  Filled 2018-07-31: qty 5

## 2018-07-31 MED ORDER — OXYCODONE-ACETAMINOPHEN 5-325 MG PO TABS: 1.0000 | ORAL_TABLET | Freq: Four times a day (QID) | ORAL | 0 refills | Status: AC | PRN

## 2018-07-31 NOTE — ED Provider Notes (Signed)
[facility] Emergency Department Provider Note   ____________________________________________   First MD Initiated Contact with Patient 07/31/18 [phone]     (approximate)  I have reviewed the triage vital signs and the nursing notes.   HISTORY  Chief Complaint Wound Check   Antonio Phillips is a 73 y.o. male patient reports bleeding from dialysis shunt since 230 yesterday afternoon.  Is been a slow ooze.  He has soaked a towel and some other dressings though.  Patient is not woozy.  Patient also complains of severe pain in his stumps.  He says he has been on hydrocodone since he had his hip replacement 22 years ago.  It is not working very well for his stump pain.  He said when he was in the community living center in the [place] he was getting Percocets and that helps a lot.  He has an appointment to get new medication at the [place] in the beginning of June.  I will give him some Percocets to try to tide him through till he can get his new medication from the [place].         Past Medical History:  Diagnosis Date  . Atrial fibrillation ([place])   . Diabetes ([place])   . ESRD (end stage renal disease) [facility])     Patient Active Problem List   Diagnosis Date Noted  . Hyperkalemia 04/25/2018  . UTI (urinary tract infection) 04/25/2018  . Diabetes ([place]) 04/25/2018  . ESRD on dialysis [facility]) 04/25/2018    Past Surgical History:  Procedure Laterality Date  . BELOW KNEE LEG AMPUTATION Bilateral     Prior to Admission medications   Medication Sig Start Date End Date Taking? Authorizing Provider  acetaminophen (TYLENOL) 500 MG tablet Take 1,000 mg by mouth every 8 (eight) hours as needed. 11/18/17   [provider]  aspirin EC 81 MG tablet Take 81 mg by mouth daily. 11/02/17 11/02/18  [provider]  atorvastatin (LIPITOR) 80 MG tablet Take 80 mg by mouth Nightly. 11/18/17   [provider]  HYDROcodone-acetaminophen (NORCO/VICODIN) 5-325 MG tablet  Take 1 tablet by mouth every 6 (six) hours as needed for moderate pain.    [provider]  midodrine (PROAMATINE) 2.5 MG tablet Take 2.5 mg by mouth 3 (three) times daily with meals.    [provider]  oxyCODONE-acetaminophen (PERCOCET) 5-325 MG tablet Take 1 tablet by mouth every 6 (six) hours as needed for severe pain. 07/31/18 07/31/19  Nena Polio, MD  pregabalin (LYRICA) 25 MG capsule Take 25 mg by mouth Nightly. 11/02/17   [provider]  warfarin (COUMADIN) 2.5 MG tablet Take 2.5 mg by mouth daily. Take 1 tablet by mouth sat/t/th, take 2 tablets m/w/f/sun 11/18/17 11/16/18  [provider]    Allergies Patient has no known allergies.  History reviewed. No pertinent family history.  Social History Social History   Tobacco Use  . Smoking status: Never Smoker  . Smokeless tobacco: Never Used  Substance Use Topics  . Alcohol use: Never    Frequency: Never  . Drug use: Never    Review of Systems  Constitutional: No fever/chills Eyes: No visual changes. ENT: No sore throat. Cardiovascular: Denies chest pain. Respiratory: Denies shortness of breath. Gastrointestinal: No abdominal pain.  No nausea, no vomiting.  No diarrhea.  No constipation. Genitourinary: Negative for dysuria. Musculoskeletal: Negative for back pain. Skin: Negative for rash. Neurological: Negative for headaches, focal weakness   ____________________________________________  PHYSICAL EXAM:  VITAL SIGNS: ED Triage Vitals  Enc Vitals Group     BP --      Pulse Rate 07/31/18 0630 71     Resp --      Temp --      Temp src --      SpO2 07/31/18 0630 98 %     Weight 07/31/18 0609 220 lb (99.8 kg)     Height 07/31/18 0609 5\' 11"  (1.803 m)     Head Circumference --      Peak Flow --      Pain Score 07/31/18 0609 0     Pain Loc --      Pain Edu? --      Excl. in [place]? --     Constitutional: Alert and oriented. Well appearing and in no acute distress. Eyes:  Conjunctivae are normal. Head: Atraumatic. Nose: No congestion/rhinnorhea. Mouth/Throat: Mucous membranes are moist.  Oropharynx non-erythematous. Neck: No stridor.   Cardiovascular: Normal rate, regular rhythm. Grossly normal heart sounds.  Good peripheral circulation. Respiratory: Normal respiratory effort.  No retractions. Lungs CTAB. Musculoskeletal: Bilateral BKA Neurologic:  Normal speech and language. No gross focal neurologic deficits are appreciated.  Skin:  Skin is warm, dry and intact except for a small area of bleeding in the left bicep from his shunt.   No rash noted. Psychiatric: Mood and affect are normal. Speech and behavior are normal.  ____________________________________________   LABS (all labs ordered are listed, but only abnormal results are displayed)  Labs Reviewed  COMPREHENSIVE METABOLIC PANEL - Abnormal; Notable for the following components:      Result Value   Chloride 97 (*)    Glucose, Bld 205 (*)    BUN 32 (*)    Creatinine, Ser 5.47 (*)    Calcium 8.5 (*)    AST 14 (*)    GFR calc non Af Amer 10 (*)    GFR calc Af Amer 11 (*)    All other components within normal limits  CBC WITH DIFFERENTIAL/PLATELET - Abnormal; Notable for the following components:   RBC 3.09 (*)    Hemoglobin 9.7 (*)    HCT 30.7 (*)    RDW 15.9 (*)    Abs Immature Granulocytes 0.08 (*)    All other components within normal limits   ____________________________________________  EKG   ____________________________________________  RADIOLOGY  ED MD interpretation:  Official radiology report(s): No results found.  ____________________________________________   PROCEDURES  Procedure(s) performed (including Critical Care):  Procedures I put some Surgicel on the wound and put pressure on it for 10 minutes.  This did not stop the bleeding.  Therefore I cleaned the area with Betadine thoroughly anesthetized the bleeding area with a small amount of 1% lidocaine and  then put a figure-of-eight stitch in it.  This stopped the bleeding from the dialysis site but there was a small amount of bleeding from the site where he injected the anesthetic.  I held pressure on this with some gauze for about 2 minutes and the bleeding stopped and did not recur.  Bandage was applied.   ____________________________________________   INITIAL IMPRESSION / ASSESSMENT AND PLAN / ED COURSE  Patient seems to be doing quite well now.  We will discharge him home with follow-up for his stitch removal.  He has a good thrill in the shunt at this time.              ____________________________________________   FINAL CLINICAL IMPRESSION(S) /  ED DIAGNOSES  Final diagnoses:  Bleeding from dialysis shunt, initial encounter [facility])  Amputation stump pain [facility])     ED Discharge Orders         Ordered    oxyCODONE-acetaminophen (PERCOCET) 5-325 MG tablet  Every 6 hours PRN     07/31/18 0644           Note:  This document was prepared using Dragon voice recognition software and may include unintentional dictation errors.    Nena Polio, MD 07/31/18 [phone]

## 2018-07-31 NOTE — ED Notes (Signed)
AAOx3.  Skin warm and dry.  NAD 

## 2018-07-31 NOTE — Discharge Instructions (Addendum)
Return for any signs of infection where the stitches.  This includes increasing pain swelling or redness.  Have the stitches taken out in a week.  If you need the Percocet for the severe pain you are having in your stumps be sure to stop the hydrocodone first.  Do not take both at once.  That may be too much for you and it may be too much Tylenol for your as well.  The Percocet is 1 pill 4 times a day.

## 2018-07-31 NOTE — ED Triage Notes (Signed)
Pt presents to ED via ems with c/o bleeding from his fistula since dialysis at 1400 Monday. EMS report pt is bleeding continuously but not heavily. Pt alert and calm at this time.

## 2018-11-09 ENCOUNTER — Other Ambulatory Visit: Payer: Self-pay

## 2018-11-09 ENCOUNTER — Inpatient Hospital Stay
Admission: EM | Admit: 2018-11-09 | Discharge: 2018-11-14 | DRG: 640 | Disposition: A | Discharge status: Home / Self Care | Payer: No Typology Code available for payment source | Attending: Internal Medicine | Admitting: Internal Medicine

## 2018-11-09 ENCOUNTER — Inpatient Hospital Stay: Payer: No Typology Code available for payment source

## 2018-11-09 DIAGNOSIS — E1122 Type 2 diabetes mellitus with diabetic chronic kidney disease: Secondary | ICD-10-CM | POA: Diagnosis present

## 2018-11-09 DIAGNOSIS — Z7901 Long term (current) use of anticoagulants: Secondary | ICD-10-CM | POA: Diagnosis not present

## 2018-11-09 DIAGNOSIS — Z9115 Patient's noncompliance with renal dialysis: Secondary | ICD-10-CM

## 2018-11-09 DIAGNOSIS — N2581 Secondary hyperparathyroidism of renal origin: Secondary | ICD-10-CM | POA: Diagnosis present

## 2018-11-09 DIAGNOSIS — I480 Paroxysmal atrial fibrillation: Secondary | ICD-10-CM | POA: Diagnosis present

## 2018-11-09 DIAGNOSIS — Z89512 Acquired absence of left leg below knee: Secondary | ICD-10-CM

## 2018-11-09 DIAGNOSIS — E875 Hyperkalemia: Principal | ICD-10-CM | POA: Diagnosis present

## 2018-11-09 DIAGNOSIS — Z79899 Other long term (current) drug therapy: Secondary | ICD-10-CM | POA: Diagnosis not present

## 2018-11-09 DIAGNOSIS — R531 Weakness: Secondary | ICD-10-CM

## 2018-11-09 DIAGNOSIS — D631 Anemia in chronic kidney disease: Secondary | ICD-10-CM | POA: Diagnosis present

## 2018-11-09 DIAGNOSIS — G4733 Obstructive sleep apnea (adult) (pediatric): Secondary | ICD-10-CM | POA: Diagnosis present

## 2018-11-09 DIAGNOSIS — Z20828 Contact with and (suspected) exposure to other viral communicable diseases: Secondary | ICD-10-CM | POA: Diagnosis present

## 2018-11-09 DIAGNOSIS — N186 End stage renal disease: Secondary | ICD-10-CM | POA: Diagnosis present

## 2018-11-09 DIAGNOSIS — Z89511 Acquired absence of right leg below knee: Secondary | ICD-10-CM | POA: Diagnosis not present

## 2018-11-09 DIAGNOSIS — I959 Hypotension, unspecified: Secondary | ICD-10-CM | POA: Diagnosis present

## 2018-11-09 DIAGNOSIS — Z992 Dependence on renal dialysis: Secondary | ICD-10-CM | POA: Diagnosis not present

## 2018-11-09 DIAGNOSIS — E1151 Type 2 diabetes mellitus with diabetic peripheral angiopathy without gangrene: Secondary | ICD-10-CM | POA: Diagnosis present

## 2018-11-09 LAB — COMPREHENSIVE METABOLIC PANEL
ALT: 11 U/L (ref 0–44)
AST: 8 U/L — ABNORMAL LOW (ref 15–41)
Albumin: 3.9 g/dL (ref 3.5–5.0)
Alkaline Phosphatase: 87 U/L (ref 38–126)
Anion gap: 18 — ABNORMAL HIGH (ref 5–15)
BUN: 96 mg/dL — ABNORMAL HIGH (ref 8–23)
CO2: 18 mmol/L — ABNORMAL LOW (ref 22–32)
Calcium: 8.9 mg/dL (ref 8.9–10.3)
Chloride: 101 mmol/L (ref 98–111)
Creatinine, Ser: 11.89 mg/dL — ABNORMAL HIGH (ref 0.61–1.24)
GFR calc Af Amer: 4 mL/min — ABNORMAL LOW (ref >60)
GFR calc non Af Amer: 4 mL/min — ABNORMAL LOW (ref >60)
Glucose, Bld: 140 mg/dL — ABNORMAL HIGH (ref 70–99)
Potassium: 7.3 mmol/L (ref 3.5–5.1)
Sodium: 137 mmol/L (ref 135–145)
Total Bilirubin: 0.5 mg/dL (ref 0.3–1.2)
Total Protein: 7 g/dL (ref 6.5–8.1)

## 2018-11-09 LAB — CBC
HCT: 34.3 % — ABNORMAL LOW (ref 39.0–52.0)
HCT: 29 % — ABNORMAL LOW (ref 39.0–52.0)
Hemoglobin: 10.5 g/dL — ABNORMAL LOW (ref 13.0–17.0)
Hemoglobin: 9.1 g/dL — ABNORMAL LOW (ref 13.0–17.0)
MCH: 29.8 pg (ref 26.0–34.0)
MCH: 29.9 pg (ref 26.0–34.0)
MCHC: 30.6 g/dL (ref 30.0–36.0)
MCHC: 31.4 g/dL (ref 30.0–36.0)
MCV: 97.4 fL (ref 80.0–100.0)
MCV: 95.4 fL (ref 80.0–100.0)
Platelets: 138 10*3/uL — ABNORMAL LOW (ref 150–400)
Platelets: 134 10*3/uL — ABNORMAL LOW (ref 150–400)
RBC: 3.52 MIL/uL — ABNORMAL LOW (ref 4.22–5.81)
RBC: 3.04 MIL/uL — ABNORMAL LOW (ref 4.22–5.81)
RDW: 17.5 % — ABNORMAL HIGH (ref 11.5–15.5)
RDW: 17.3 % — ABNORMAL HIGH (ref 11.5–15.5)
WBC: 5.6 10*3/uL (ref 4.0–10.5)
WBC: 6.4 10*3/uL (ref 4.0–10.5)
nRBC: 0 % (ref 0.0–0.2)
nRBC: 0 % (ref 0.0–0.2)

## 2018-11-09 LAB — PROTIME-INR
INR: 1.6 — ABNORMAL HIGH (ref 0.8–1.2)
Prothrombin Time: 19.2 seconds — ABNORMAL HIGH (ref 11.4–15.2)

## 2018-11-09 LAB — RENAL FUNCTION PANEL
Albumin: 3.9 g/dL (ref 3.5–5.0)
Anion gap: 17 — ABNORMAL HIGH (ref 5–15)
BUN: 65 mg/dL — ABNORMAL HIGH (ref 8–23)
CO2: 19 mmol/L — ABNORMAL LOW (ref 22–32)
Calcium: 8.6 mg/dL — ABNORMAL LOW (ref 8.9–10.3)
Chloride: 101 mmol/L (ref 98–111)
Creatinine, Ser: 7.83 mg/dL — ABNORMAL HIGH (ref 0.61–1.24)
GFR calc Af Amer: 7 mL/min — ABNORMAL LOW (ref >60)
GFR calc non Af Amer: 6 mL/min — ABNORMAL LOW (ref >60)
Glucose, Bld: 114 mg/dL — ABNORMAL HIGH (ref 70–99)
Phosphorus: 5.5 mg/dL — ABNORMAL HIGH (ref 2.5–4.6)
Potassium: 5 mmol/L (ref 3.5–5.1)
Sodium: 137 mmol/L (ref 135–145)

## 2018-11-09 LAB — PHOSPHORUS: Phosphorus: 5.4 mg/dL — ABNORMAL HIGH (ref 2.5–4.6)

## 2018-11-09 LAB — POTASSIUM: Potassium: 3.6 mmol/L (ref 3.5–5.1)

## 2018-11-09 LAB — TROPONIN I (HIGH SENSITIVITY): Troponin I (High Sensitivity): 53 ng/L — ABNORMAL HIGH (ref <18)

## 2018-11-09 LAB — SARS CORONAVIRUS 2 BY RT PCR (HOSPITAL ORDER, PERFORMED IN ~~LOC~~ HOSPITAL LAB): SARS Coronavirus 2: NEGATIVE

## 2018-11-09 MED ORDER — ONDANSETRON HCL 4 MG/2ML IJ SOLN: 4.0000 mg | Freq: Four times a day (QID) | INTRAMUSCULAR | Status: DC | PRN

## 2018-11-09 MED ORDER — DEXTROSE 50 % IV SOLN
12.5000 g | Freq: Once | INTRAVENOUS | Status: AC
  Administered 2018-11-09: 03:00:00 12.5 g via INTRAVENOUS
  Filled 2018-11-09: qty 50

## 2018-11-09 MED ORDER — HEPARIN SODIUM (PORCINE) 5000 UNIT/ML IJ SOLN
5000.0000 [IU] | Freq: Three times a day (TID) | INTRAMUSCULAR | Status: DC
  Administered 2018-11-09 – 2018-11-13 (×10): 5000 [IU] via SUBCUTANEOUS
  Filled 2018-11-09 (×14): qty 1

## 2018-11-09 MED ORDER — MIDODRINE HCL 5 MG PO TABS
2.5000 mg | ORAL_TABLET | Freq: Three times a day (TID) | ORAL | Status: DC
  Administered 2018-11-09 – 2018-11-14 (×13): 2.5 mg via ORAL
  Filled 2018-11-09 (×17): qty 0.5

## 2018-11-09 MED ORDER — PENTAFLUOROPROP-TETRAFLUOROETH EX AERO
1.0000 "application " | INHALATION_SPRAY | CUTANEOUS | Status: DC | PRN
  Filled 2018-11-09: qty 30

## 2018-11-09 MED ORDER — ATORVASTATIN CALCIUM 20 MG PO TABS
80.0000 mg | ORAL_TABLET | Freq: Every evening | ORAL | Status: DC
  Administered 2018-11-09 – 2018-11-13 (×5): 80 mg via ORAL
  Filled 2018-11-09 (×5): qty 4

## 2018-11-09 MED ORDER — HEPARIN SODIUM (PORCINE) 1000 UNIT/ML DIALYSIS
1000.0000 [IU] | INTRAMUSCULAR | Status: DC | PRN
  Filled 2018-11-09: qty 1

## 2018-11-09 MED ORDER — ACETAMINOPHEN 650 MG RE SUPP: 650.0000 mg | Freq: Four times a day (QID) | RECTAL | Status: DC | PRN

## 2018-11-09 MED ORDER — CHLORHEXIDINE GLUCONATE CLOTH 2 % EX PADS
6.0000 | MEDICATED_PAD | Freq: Every day | CUTANEOUS | Status: DC
  Filled 2018-11-09: qty 6

## 2018-11-09 MED ORDER — WARFARIN SODIUM 6 MG PO TABS
6.0000 mg | ORAL_TABLET | Freq: Once | ORAL | Status: AC
  Administered 2018-11-09: 6 mg via ORAL
  Filled 2018-11-09: qty 1

## 2018-11-09 MED ORDER — PREGABALIN 25 MG PO CAPS
25.0000 mg | ORAL_CAPSULE | Freq: Every day | ORAL | Status: DC
  Administered 2018-11-09 – 2018-11-13 (×5): 25 mg via ORAL
  Filled 2018-11-09 (×6): qty 1

## 2018-11-09 MED ORDER — INSULIN ASPART 100 UNIT/ML ~~LOC~~ SOLN
5.0000 [IU] | Freq: Once | SUBCUTANEOUS | Status: AC
  Administered 2018-11-09: 5 [IU] via INTRAVENOUS
  Filled 2018-11-09: qty 1

## 2018-11-09 MED ORDER — ASPIRIN EC 81 MG PO TBEC
81.0000 mg | DELAYED_RELEASE_TABLET | Freq: Every day | ORAL | Status: DC
  Administered 2018-11-09 – 2018-11-13 (×5): 81 mg via ORAL
  Filled 2018-11-09 (×6): qty 1

## 2018-11-09 MED ORDER — OXYCODONE-ACETAMINOPHEN 5-325 MG PO TABS
1.0000 | ORAL_TABLET | Freq: Four times a day (QID) | ORAL | Status: DC | PRN
  Administered 2018-11-09 – 2018-11-14 (×16): 1 via ORAL
  Filled 2018-11-09 (×18): qty 1

## 2018-11-09 MED ORDER — SODIUM ZIRCONIUM CYCLOSILICATE 10 G PO PACK
10.0000 g | PACK | Freq: Once | ORAL | Status: AC
  Administered 2018-11-09: 10 g via ORAL
  Filled 2018-11-09: qty 1

## 2018-11-09 MED ORDER — ACETAMINOPHEN 325 MG PO TABS
650.0000 mg | ORAL_TABLET | Freq: Four times a day (QID) | ORAL | Status: DC | PRN
  Administered 2018-11-09 – 2018-11-10 (×2): 650 mg via ORAL
  Filled 2018-11-09 (×2): qty 2

## 2018-11-09 MED ORDER — SODIUM BICARBONATE 8.4 % IV SOLN
50.0000 meq | Freq: Once | INTRAVENOUS | Status: AC
  Administered 2018-11-09: 50 meq via INTRAVENOUS
  Filled 2018-11-09: qty 50

## 2018-11-09 MED ORDER — CALCIUM GLUCONATE 10 % IV SOLN
1.0000 g | Freq: Once | INTRAVENOUS | Status: AC
  Administered 2018-11-09: 1 g via INTRAVENOUS
  Filled 2018-11-09: qty 10

## 2018-11-09 MED ORDER — WARFARIN - PHARMACIST DOSING INPATIENT
Freq: Every day | Status: DC
  Administered 2018-11-10: 17:00:00

## 2018-11-09 MED ORDER — DOCUSATE SODIUM 100 MG PO CAPS
100.0000 mg | ORAL_CAPSULE | Freq: Two times a day (BID) | ORAL | Status: DC
  Administered 2018-11-10 – 2018-11-11 (×2): 100 mg via ORAL
  Filled 2018-11-09 (×7): qty 1

## 2018-11-09 MED ORDER — ONDANSETRON HCL 4 MG PO TABS: 4.0000 mg | ORAL_TABLET | Freq: Four times a day (QID) | ORAL | Status: DC | PRN

## 2018-11-09 NOTE — Progress Notes (Signed)
Post HD assessment    11/09/18 0835  Neurological  Level of Consciousness Alert  Orientation Level Oriented X4  Respiratory  Respiratory Pattern Regular;Unlabored  Chest Assessment Chest expansion symmetrical  Bilateral Breath Sounds Clear;Diminished  Cough None  Cardiac  Pulse Irregular  Heart Sounds S1, S2  ECG Monitor Yes  Cardiac Rhythm SB;NSR  Vascular  R Radial Pulse +2  L Radial Pulse +2  Edema Generalized  Psychosocial  Psychosocial (WDL) WDL

## 2018-11-09 NOTE — ED Triage Notes (Signed)
no dialysis for 1 week, vomiting, was seen at the [facility] yesterday. pt was on a monitor and then took it back to the [place]. Pt now has weakness. Pt states that the weakness x 1 week but he got tired of it tonight. Per EMS low o2 sats. Pt wears cpap at night. Lung sounds clear.

## 2018-11-09 NOTE — Progress Notes (Signed)
Established hemodialysis patient, recently known at [facility]. Patient has not been to center for about a week and is not wanting to go back, therefore patient needs to be set up at a different clinic. This DC went in to speak with patient about getting him set up at a new clinic. Patient stated that all he wanted was pain medicine. This DC informed patient that it was not something they can order, but would go and ask a nurse to bring some in. Patient stated that "it wouldn't matter because they don't know what they are doing, I knew this would happen when I cam here". This DC apologized for the pain but would like to help get him set up at a clinic. Patient stated "will help". When asked questions about where patient was living and to get more information, patient stated "I give up". DC informed to patient that they were just there to help, patient stated "well you can just help yourself out the door". DC then exited room. DC inform Dr. Holley Raring that patient is refusing services, per Dr. Holley Raring patient stated to him that he wanted a MWF chair and that [facility] third shift is fine. DC called [facility] with no answer, DC called [facility] and spoke with Johann Capers to inform about patient, Johann Capers stated that with it being Friday with a holiday weekend and not having all documents needed that patient most likely will not be able to start until Wednesday. Barriers for placement are pending Hep B antigen,  Chest x-ray, [facility] patient, and patient refusing to speak with DC.   [name] Dialysis Coordinator  [phone]

## 2018-11-09 NOTE — Consult Note (Signed)
ANTICOAGULATION CONSULT NOTE  Pharmacy Consult for warfarin dose management Indication: atrial fibrillation  No Known Allergies   Vital Signs: Temp: 98.3 F (36.8 C) (09/04 0918) Temp Source: Oral (09/04 0918) BP: 129/66 (09/04 0918) Pulse Rate: 80 (09/04 0918)  Labs: Recent Labs    11/09/18 0219 11/09/18 0607  HGB 10.5* 9.1*  HCT 34.3* 29.0*  PLT 138* 134*  LABPROT 19.2*  --   INR 1.6*  --   CREATININE 11.89* 7.83*  TROPONINIHS 53*  --     Estimated Creatinine Clearance: 9.9 mL/min (A) (by C-G formula based on SCr of 7.83 mg/dL (H)).   Medical History: Past Medical History:  Diagnosis Date  . Atrial fibrillation ([place])   . Diabetes ([place])   . ESRD (end stage renal disease) ([place])     Medications:  Scheduled:  . aspirin EC  81 mg Oral Daily  . atorvastatin  80 mg Oral Nightly  . Chlorhexidine Gluconate Cloth  6 each Topical Q0600  . docusate sodium  100 mg Oral BID  . heparin  5,000 Units Subcutaneous Q8H  . midodrine  2.5 mg Oral TID WC  . pregabalin  25 mg Oral QHS    Assessment: Pharmacy consulted for warfarin dosing and monitoring for 73 yo male with PMH of atrial fibrillation. Patient was taking warfarin PTA. H&H, PLT trending down, continue to follow  Home Regimen: warfarin 2.5mg : [place], Sat                             Warfarin 5mg : Mon, Wed, Fri, Sun  Goal of Therapy:  INR 2-3 Monitor platelets by anticoagulation protocol: Yes   Plan:   Baseline INR subtherapeutic  6 mg dose tonight: this dose is 50 % greater than his average home dose  INR in am to guide dosing   CBC at least every 3 days per protocol  Dallie Piles, PharmD 11/09/2018,2:01 PM

## 2018-11-09 NOTE — ED Notes (Signed)
Pt given drink, ok per md

## 2018-11-09 NOTE — Progress Notes (Signed)
Received call on patient by Dr. Owens Shark.  Patient with critical hyperkalemia K > 7.   Has missed HD for sometime.  Has already been given IV calcium and Insulin.  Advised also to administer lokelma 10 grams PO x one now.  Dialysis RN on call has been called and is en route.  Will plan for urgent dialysis using 1K bath x 2.5 hours then switch to 1k bath.  Further plan as patient progresses.

## 2018-11-09 NOTE — ED Notes (Signed)
Pt states he gets his dialysis in [place] but has not had it in a week because he walked out. Pt says he has been homeless for over a year and [facility] of [place] has been moving him around. Pt says the dialysis place had a staff member offered him a place to stay and that the dialysis place told the staff member they would fire her if he stayed with her. Pt became upset and has not gone back.

## 2018-11-09 NOTE — Progress Notes (Signed)
Patient admitted this morning by Dr. Marcille Blanco.  Patient underwent dialysis due to hyperkalemia and patient reports that he missed dialysis in the last 2 weeks.  Continue current management.  Appreciate nephrology consultation.

## 2018-11-09 NOTE — Progress Notes (Signed)
HD Tx started    11/09/18 0500  Vital Signs  Pulse Rate (!) 54  Pulse Rate Source Monitor  Resp 14  BP (!) 81/27 (Cuff readjusted and rechecked, pt stable asymptomatic )  BP Location Right Arm  BP Method Automatic  Patient Position (if appropriate) Lying  Oxygen Therapy  SpO2 98 %  During Hemodialysis Assessment  Blood Flow Rate (mL/min) 400 mL/min  Arterial Pressure (mmHg) -180 mmHg  Venous Pressure (mmHg) 160 mmHg  Transmembrane Pressure (mmHg) 80 mmHg  Ultrafiltration Rate (mL/min) 830 mL/min  Dialysate Flow Rate (mL/min) 800 ml/min  Conductivity: Machine  13.9  HD Safety Checks Performed Yes  Dialysis Fluid Bolus Normal Saline  Bolus Amount (mL) 250 mL  Intra-Hemodialysis Comments Tx initiated  Fistula / Graft Left Upper arm Arteriovenous fistula  No Placement Date or Time found.   Orientation: Left  Access Location: Upper arm  Access Type: Arteriovenous fistula  Status Accessed  Needle Size 15g  Drainage Description None

## 2018-11-09 NOTE — Progress Notes (Signed)
HD TX completed, tolerated well, UF goal met    11/09/18 0830  Vital Signs  Pulse Rate 89  Resp (!) 23  BP (!) 164/117  BP Location Right Arm  BP Method Automatic  Patient Position (if appropriate) Lying  Oxygen Therapy  O2 Device Room Air  Pain Assessment  Pain Scale 0-10  Pain Score 0  Dialysis Weight  Weight 99 kg  Type of Weight Post-Dialysis  During Hemodialysis Assessment  HD Safety Checks Performed Yes  KECN 73.8 KECN  Dialysis Fluid Bolus Normal Saline  Bolus Amount (mL) 250 mL  Intra-Hemodialysis Comments Tx completed;Tolerated well  Post-Hemodialysis Assessment  Rinseback Volume (mL) 250 mL  KECN 73.8 V  Dialyzer Clearance Lightly streaked  Duration of HD Treatment -hour(s) 3.5 hour(s)  Hemodialysis Intake (mL) 500 mL  UF Total -Machine (mL) 2500 mL  Net UF (mL) 2000 mL  Tolerated HD Treatment Yes  AVG/AVF Arterial Site Held (minutes) 5 minutes  AVG/AVF Venous Site Held (minutes) 5 minutes  Fistula / Graft Left Upper arm Arteriovenous fistula  No Placement Date or Time found.   Orientation: Left  Access Location: Upper arm  Access Type: Arteriovenous fistula  Site Condition No complications  Fistula / Graft Assessment Present;Thrill;Bruit  Status Deaccessed  Drainage Description None

## 2018-11-09 NOTE — Progress Notes (Signed)
[facility]  ROUNDING NOTE   Subjective:  Patient to known to [place] from prior visits.  Was dialyzing at [facility].  States dialysis was interefering with his life so he "walked out" Presented with weakness and severe hyperkalemia.  K was 7.3 upon admission. Treated with insulin, D50, IV calcium, and lokelma.   K down to 5.0 during HD treatment, tolerating well.    Objective:  Vital signs in last 24 hours:  Temp:  [97.9 F (36.6 C)-98 F (36.7 C)] 98 F (36.7 C) (09/04 0430) Pulse Rate:  [54-77] 67 (09/04 0700) Resp:  [12-19] 17 (09/04 0700) BP: (81-165)/(27-75) 114/53 (09/04 0700) SpO2:  [92 %-100 %] 100 % (09/04 0600)  Weight change:  Filed Weights    Intake/Output: No intake/output data recorded.   Intake/Output this shift:  No intake/output data recorded.  Physical Exam: General: No acute distress  Head: Normocephalic, atraumatic. Moist oral mucosal membranes  Eyes: Anicteric  Neck: Supple, trachea midline  Lungs:  Clear to auscultation, normal effort  Heart: S1S2 no rubs  Abdomen:  Soft, nontender, bowel sounds present  Extremities: 1+ peripheral edema.  Neurologic: Awake, alert, following commands  Skin: No lesions  Access: LUE AVF    Basic Metabolic Panel: Recent Labs  Lab 11/09/18 0219 11/09/18 0607  NA 137 137  K 7.3* 5.0  CL 101 101  CO2 18* 19*  GLUCOSE 140* 114*  BUN 96* 65*  CREATININE 11.89* 7.83*  CALCIUM 8.9 8.6*  PHOS  --  5.4*  5.5*    Liver Function Tests: Recent Labs  Lab 11/09/18 0219 11/09/18 0607  AST 8*  --   ALT 11  --   ALKPHOS 87  --   BILITOT 0.5  --   PROT 7.0  --   ALBUMIN 3.9 3.9   No results for input(s): LIPASE, AMYLASE in the last 168 hours. No results for input(s): AMMONIA in the last 168 hours.  CBC: Recent Labs  Lab 11/09/18 0219 11/09/18 0607  WBC 5.6 6.4  HGB 10.5* 9.1*  HCT 34.3* 29.0*  MCV 97.4 95.4  PLT 138* 134*    Cardiac Enzymes: No results for input(s): CKTOTAL,  CKMB, CKMBINDEX, TROPONINI in the last 168 hours.  BNP: Invalid input(s): POCBNP  CBG: No results for input(s): GLUCAP in the last 168 hours.  Microbiology: Results for orders placed or performed during the hospital encounter of 11/09/18  SARS Coronavirus 2 [facility] order, Performed in [facility] hospital lab) Nasopharyngeal Nasopharyngeal Swab     Status: None   Collection Time: 11/09/18  2:19 AM   Specimen: Nasopharyngeal Swab  Result Value Ref Range Status   SARS Coronavirus 2 NEGATIVE NEGATIVE Final    Comment: (NOTE) If result is NEGATIVE SARS-CoV-2 target nucleic acids are NOT DETECTED. The SARS-CoV-2 RNA is generally detectable in upper and lower  respiratory specimens during the acute phase of infection. The lowest  concentration of SARS-CoV-2 viral copies this assay can detect is 250  copies / mL. A negative result does not preclude SARS-CoV-2 infection  and should not be used as the sole basis for treatment or other  patient management decisions.  A negative result may occur with  improper specimen collection / handling, submission of specimen other  than nasopharyngeal swab, presence of viral mutation(s) within the  areas targeted by this assay, and inadequate number of viral copies  (<250 copies / mL). A negative result must be combined with clinical  observations, patient history, and epidemiological information. If  result is POSITIVE SARS-CoV-2 target nucleic acids are DETECTED. The SARS-CoV-2 RNA is generally detectable in upper and lower  respiratory specimens dur ing the acute phase of infection.  Positive  results are indicative of active infection with SARS-CoV-2.  Clinical  correlation with patient history and other diagnostic information is  necessary to determine patient infection status.  Positive results do  not rule out bacterial infection or co-infection with other viruses. If result is PRESUMPTIVE POSTIVE SARS-CoV-2 nucleic acids MAY BE PRESENT.    A presumptive positive result was obtained on the submitted specimen  and confirmed on repeat testing.  While 2019 novel coronavirus  (SARS-CoV-2) nucleic acids may be present in the submitted sample  additional confirmatory testing may be necessary for epidemiological  and / or clinical management purposes  to differentiate between  SARS-CoV-2 and other Sarbecovirus currently known to infect humans.  If clinically indicated additional testing with an alternate test  methodology [phone]) is advised. The SARS-CoV-2 RNA is generally  detectable in upper and lower respiratory sp ecimens during the acute  phase of infection. The expected result is Negative. Fact Sheet for Patients:  StrictlyIdeas.no Fact Sheet for Healthcare Providers: BankingDealers.co.za This test is not yet approved or cleared by the [place] FDA and has been authorized for detection and/or diagnosis of SARS-CoV-2 by FDA under an Emergency Use Authorization (EUA).  This EUA will remain in effect (meaning this test can be used) for the duration of the COVID-19 declaration under Section 564(b)(1) of the Act, 21 U.S.C. section 360bbb-3(b)(1), unless the authorization is terminated or revoked sooner. Performed at [facility], [address] [place], [place] [address]     Coagulation Studies: No results for input(s): LABPROT, INR in the last 72 hours.  Urinalysis: No results for input(s): COLORURINE, LABSPEC, PHURINE, GLUCOSEU, HGBUR, BILIRUBINUR, KETONESUR, PROTEINUR, UROBILINOGEN, NITRITE, LEUKOCYTESUR in the last 72 hours.  Invalid input(s): APPERANCEUR    Imaging: No results found.   Medications:    . Chlorhexidine Gluconate Cloth  6 each Topical Q0600   heparin, pentafluoroprop-tetrafluoroeth  Assessment/ Plan:  73 y.o. 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   [facility]/left arm AV fistula/MWF  1.  ESRD on HD MWF.  Patient has missed dialysis for greater than 1 week.  He states that dialysis was interfering with his life.  He would like to find a new dialysis center to go to.  Patient seen and evaluated during dialysis treatment.  Potassium down to 5.  Continue to monitor.  2.  Severe hyperkalemia.  Secondary to missed dialysis treatments.  Patient was dialyzed against a 1K bath earlier on in the treatment.  Potassium down to 5.  Currently on 2K bath.  3.  Secondary hyperparathyroidism.  Phosphorus currently 5.4.  Continue to monitor.  4.  Anemia of chronic kidney disease.  Hemoglobin 9.1.  Consider starting Epogen during next dialysis treatment.     LOS: 0 [place] 9/4/20207:52 AM

## 2018-11-09 NOTE — Progress Notes (Signed)
Pre HD Assessment    11/09/18 0450  Neurological  Level of Consciousness Alert  Orientation Level Oriented X4  Respiratory  Respiratory Pattern Regular;Unlabored  Chest Assessment Chest expansion symmetrical  Bilateral Breath Sounds Fine crackles;Diminished  Cough None  Cardiac  Pulse Irregular  Heart Sounds S1, S2  ECG Monitor Yes  Cardiac Rhythm SB;NSR  Vascular  R Radial Pulse +2  L Radial Pulse +2  Psychosocial  Psychosocial (WDL) WDL

## 2018-11-09 NOTE — H&P (Signed)
Antonio Phillips is an 73 y.o. male.   Chief Complaint: Tremors HPI: The patient with past medical history of diabetes, end-stage renal disease on dialysis and atrial fibrillation presents to the emergency department due to tremors of his extremities as well as generalized weakness.  He admits that he has not been to dialysis in 2 weeks due to some misunderstanding between the [place] and his dialysis center.  He is not pleased with his care and his current dialysis center.  Currently, the patient denies nausea or vomiting.  He also denies confusion but is admittedly uncooperative/not in the mood to participate with history and physical.  Laboratory evaluation revealed significant hyperkalemia.  The patient was given, calcium gluconate, sodium bicarb as well as Lokelma prior to the emergency department staff, hospitalist service for admission.  Past Medical History:  Diagnosis Date  . Atrial fibrillation ([place])   . Diabetes ([place])   . ESRD (end stage renal disease) [facility])     Past Surgical History:  Procedure Laterality Date  . BELOW KNEE LEG AMPUTATION Bilateral     No family history on file. Difficult to attain history as the patient is not consistently cooperative   Social History:  reports that he has never smoked. He has never used smokeless tobacco. He reports that he does not drink alcohol or use drugs.  Allergies: No Known Allergies  Medications Prior to Admission  Medication Sig Dispense Refill  . acetaminophen (TYLENOL) 500 MG tablet Take 1,000 mg by mouth every 8 (eight) hours as needed.    Marland Kitchen aspirin EC 81 MG tablet Take 81 mg by mouth daily.    Marland Kitchen atorvastatin (LIPITOR) 80 MG tablet Take 80 mg by mouth Nightly.    Marland Kitchen HYDROcodone-acetaminophen (NORCO/VICODIN) 5-325 MG tablet Take 1 tablet by mouth every 6 (six) hours as needed for moderate pain.    . midodrine (PROAMATINE) 2.5 MG tablet Take 2.5 mg by mouth 3 (three) times daily with meals.    Marland Kitchen oxyCODONE-acetaminophen (PERCOCET)  5-325 MG tablet Take 1 tablet by mouth every 6 (six) hours as needed for severe pain. 15 tablet 0  . pregabalin (LYRICA) 25 MG capsule Take 25 mg by mouth Nightly.    . warfarin (COUMADIN) 2.5 MG tablet Take 2.5 mg by mouth daily. Take 1 tablet by mouth sat/t/th, take 2 tablets m/w/f/sun      Results for orders placed or performed during the hospital encounter of 11/09/18 (from the past 48 hour(s))  CBC     Status: Abnormal   Collection Time: 11/09/18  2:19 AM  Result Value Ref Range   WBC 5.6 4.0 - 10.5 K/uL   RBC 3.52 (L) 4.22 - 5.81 MIL/uL   Hemoglobin 10.5 (L) 13.0 - 17.0 g/dL   HCT 34.3 (L) 39.0 - 52.0 %   MCV 97.4 80.0 - 100.0 fL   MCH 29.8 26.0 - 34.0 pg   MCHC 30.6 30.0 - 36.0 g/dL   RDW 17.5 (H) 11.5 - 15.5 %   Platelets 138 (L) 150 - 400 K/uL   nRBC 0.0 0.0 - 0.2 %    Comment: Performed at [facility], [address] [place], [place] [address]  Comprehensive metabolic panel     Status: Abnormal   Collection Time: 11/09/18  2:19 AM  Result Value Ref Range   Sodium 137 135 - 145 mmol/L   Potassium 7.3 (HH) 3.5 - 5.1 mmol/L    Comment: CRITICAL RESULT CALLED TO, READ BACK BY AND VERIFIED WITH GRACIE  [facility] ON 11/09/18 AT 0310 [facility] RESULT CONFIRMED BY MANUAL DILUTION Twain    Chloride 101 98 - 111 mmol/L   CO2 18 (L) 22 - 32 mmol/L   Glucose, Bld 140 (H) 70 - 99 mg/dL   BUN 96 (H) 8 - 23 mg/dL    Comment: RESULT CONFIRMED BY MANUAL DILUTION Butler   Creatinine, Ser 11.89 (H) 0.61 - 1.24 mg/dL   Calcium 8.9 8.9 - 10.3 mg/dL   Total Protein 7.0 6.5 - 8.1 g/dL   Albumin 3.9 3.5 - 5.0 g/dL   AST 8 (L) 15 - 41 U/L   ALT 11 0 - 44 U/L   Alkaline Phosphatase 87 38 - 126 U/L   Total Bilirubin 0.5 0.3 - 1.2 mg/dL   GFR calc non Af Amer 4 (L) >60 mL/min   GFR calc Af Amer 4 (L) >60 mL/min   Anion gap 18 (H) 5 - 15    Comment: Performed at [facility], [place], [place] [address]  Troponin I (High Sensitivity)     Status: Abnormal   Collection  Time: 11/09/18  2:19 AM  Result Value Ref Range   Troponin I (High Sensitivity) 53 (H) <18 ng/L    Comment: (NOTE) Elevated high sensitivity troponin I (hsTnI) values and significant  changes across serial measurements may suggest ACS but many other  chronic and acute conditions are known to elevate hsTnI results.  Refer to the "Links" section for chest pain algorithms and additional  guidance. Performed at [facility], [address] [place], [place] [address]   SARS Coronavirus 2 [facility] order, Performed in [facility] hospital lab) Nasopharyngeal Nasopharyngeal Swab     Status: None   Collection Time: 11/09/18  2:19 AM   Specimen: Nasopharyngeal Swab  Result Value Ref Range   SARS Coronavirus 2 NEGATIVE NEGATIVE    Comment: (NOTE) If result is NEGATIVE SARS-CoV-2 target nucleic acids are NOT DETECTED. The SARS-CoV-2 RNA is generally detectable in upper and lower  respiratory specimens during the acute phase of infection. The lowest  concentration of SARS-CoV-2 viral copies this assay can detect is 250  copies / mL. A negative result does not preclude SARS-CoV-2 infection  and should not be used as the sole basis for treatment or other  patient management decisions.  A negative result may occur with  improper specimen collection / handling, submission of specimen other  than nasopharyngeal swab, presence of viral mutation(s) within the  areas targeted by this assay, and inadequate number of viral copies  (<250 copies / mL). A negative result must be combined with clinical  observations, patient history, and epidemiological information. If result is POSITIVE SARS-CoV-2 target nucleic acids are DETECTED. The SARS-CoV-2 RNA is generally detectable in upper and lower  respiratory specimens dur ing the acute phase of infection.  Positive  results are indicative of active infection with SARS-CoV-2.  Clinical  correlation with patient history and other diagnostic  information is  necessary to determine patient infection status.  Positive results do  not rule out bacterial infection or co-infection with other viruses. If result is PRESUMPTIVE POSTIVE SARS-CoV-2 nucleic acids MAY BE PRESENT.   A presumptive positive result was obtained on the submitted specimen  and confirmed on repeat testing.  While 2019 novel coronavirus  (SARS-CoV-2) nucleic acids may be present in the submitted sample  additional confirmatory testing may be necessary for epidemiological  and / or clinical management purposes  to differentiate between  SARS-CoV-2  and other Sarbecovirus currently known to infect humans.  If clinically indicated additional testing with an alternate test  methodology [phone]) is advised. The SARS-CoV-2 RNA is generally  detectable in upper and lower respiratory sp ecimens during the acute  phase of infection. The expected result is Negative. Fact Sheet for Patients:  StrictlyIdeas.no Fact Sheet for Healthcare Providers: BankingDealers.co.za This test is not yet approved or cleared by the [place] FDA and has been authorized for detection and/or diagnosis of SARS-CoV-2 by FDA under an Emergency Use Authorization (EUA).  This EUA will remain in effect (meaning this test can be used) for the duration of the COVID-19 declaration under Section 564(b)(1) of the Act, 21 U.S.C. section 360bbb-3(b)(1), unless the authorization is terminated or revoked sooner. Performed at [facility], [address] [place], [place] [address]   Renal function panel     Status: Abnormal   Collection Time: 11/09/18  6:07 AM  Result Value Ref Range   Sodium 137 135 - 145 mmol/L   Potassium 5.0 3.5 - 5.1 mmol/L   Chloride 101 98 - 111 mmol/L   CO2 19 (L) 22 - 32 mmol/L   Glucose, Bld 114 (H) 70 - 99 mg/dL   BUN 65 (H) 8 - 23 mg/dL   Creatinine, Ser 7.83 (H) 0.61 - 1.24 mg/dL   Calcium 8.6 (L) 8.9 - 10.3  mg/dL   Phosphorus 5.5 (H) 2.5 - 4.6 mg/dL   Albumin 3.9 3.5 - 5.0 g/dL   GFR calc non Af Amer 6 (L) >60 mL/min   GFR calc Af Amer 7 (L) >60 mL/min   Anion gap 17 (H) 5 - 15    Comment: Performed at [facility], [address] [place], [place] [address]  CBC     Status: Abnormal   Collection Time: 11/09/18  6:07 AM  Result Value Ref Range   WBC 6.4 4.0 - 10.5 K/uL   RBC 3.04 (L) 4.22 - 5.81 MIL/uL   Hemoglobin 9.1 (L) 13.0 - 17.0 g/dL   HCT 29.0 (L) 39.0 - 52.0 %   MCV 95.4 80.0 - 100.0 fL   MCH 29.9 26.0 - 34.0 pg   MCHC 31.4 30.0 - 36.0 g/dL   RDW 17.3 (H) 11.5 - 15.5 %   Platelets 134 (L) 150 - 400 K/uL   nRBC 0.0 0.0 - 0.2 %    Comment: Performed at [facility], [address] [place], [place] [address]  Phosphorus     Status: Abnormal   Collection Time: 11/09/18  6:07 AM  Result Value Ref Range   Phosphorus 5.4 (H) 2.5 - 4.6 mg/dL    Comment: Performed at [facility], [address] [place], [place] [address]   No results found.  Review of Systems  Constitutional: Negative for chills and fever.  HENT: Negative for sore throat and tinnitus.   Eyes: Negative for blurred vision and redness.  Respiratory: Negative for cough and shortness of breath.   Cardiovascular: Negative for chest pain, palpitations, orthopnea and PND.  Gastrointestinal: Negative for abdominal pain, diarrhea, nausea and vomiting.  Genitourinary: Negative for dysuria, frequency and urgency.  Musculoskeletal: Negative for joint pain and myalgias.  Skin: Negative for rash.       No lesions  Neurological: Positive for tremors and weakness. Negative for speech change and focal weakness.  Endo/Heme/Allergies: Does not bruise/bleed easily.       No temperature intolerance  Psychiatric/Behavioral: Negative for depression and suicidal ideas.  Blood pressure (!) 114/53, pulse 67, temperature 98 F (36.7 C), temperature source Oral, resp. rate 17, SpO2 100 %. Physical  Exam  Vitals reviewed. Constitutional: He is oriented to person, place, and time. He appears well-developed and well-nourished. No distress.  HENT:  Head: Normocephalic and atraumatic.  Mouth/Throat: Oropharynx is clear and moist.  Eyes: Pupils are equal, round, and reactive to light. Conjunctivae and EOM are normal. No scleral icterus.  Neck: Normal range of motion. Neck supple. No JVD present. No tracheal deviation present. No thyromegaly present.  Cardiovascular: Normal rate, regular rhythm and normal heart sounds. Exam reveals no gallop and no friction rub.  No murmur heard. Respiratory: Effort normal and breath sounds normal. No respiratory distress.  GI: Soft. Bowel sounds are normal. He exhibits no distension. There is no abdominal tenderness.  Genitourinary:    Genitourinary Comments: Deferred   Musculoskeletal: Normal range of motion.        General: No edema.     Comments: Bilateral BKAs  Lymphadenopathy:    He has no cervical adenopathy.  Neurological: He is alert and oriented to person, place, and time. No cranial nerve deficit.  Skin: Skin is warm and dry. No rash noted. No erythema.  Psychiatric: He has a normal mood and affect. His behavior is normal. Judgment and thought content normal.     Assessment/Plan This is a 73 year old male admitted for hyperkalemia. 1.  Hyperkalemia: No arrhythmias noted at this time.  Continue to monitor telemetry.  The patient needs urgent dialysis. 2.  End-stage renal disease: On hemodialysis; consult nephrology for continuation of dialysis treatments 3.  Diabetes mellitus type 2: Check hemoglobin A1c.  Sliding scale insulin as needed 4.  Atrial fibrillation: Rate controlled; continue aspirin.  (Reconcile medications with pharmacy for anticoagulation regimen) 5.  DVT prophylaxis: Heparin number disease the patient cannot remember his warfarin dosing). 6.  GI prophylaxis: None The patient is a full code.  Time spent on admission orders and  patient care approximately 45 minutes.  Harrie Foreman, MD 11/09/2018, 7:27 AM

## 2018-11-09 NOTE — ED Provider Notes (Signed)
[facility] Emergency Department Provider Note   First MD Initiated Contact with Patient 11/09/18 [phone]     (approximate)  I have reviewed the triage vital signs and the nursing notes.   HISTORY  Chief Complaint Weakness    HPI Antonio Phillips is a 73 y.o. male with below list of previous medical conditions including end-stage renal disease presents to the emergency department via EMS secondary to generalized weakness.  Patient states that he has not received dialysis in approximately week and a half.  Patient states that he "quit" his dialysis center secondary to a disagreement.  Patient denies any chest pain no shortness of breath.  Patient denies any fever.       Past Medical History:  Diagnosis Date  . Atrial fibrillation ([place])   . Diabetes ([place])   . ESRD (end stage renal disease) [facility])     Patient Active Problem List   Diagnosis Date Noted  . Hyperkalemia 04/25/2018  . UTI (urinary tract infection) 04/25/2018  . Diabetes ([place]) 04/25/2018  . ESRD on dialysis [facility]) 04/25/2018    Past Surgical History:  Procedure Laterality Date  . BELOW KNEE LEG AMPUTATION Bilateral     Prior to Admission medications   Medication Sig Start Date End Date Taking? Authorizing Provider  acetaminophen (TYLENOL) 500 MG tablet Take 1,000 mg by mouth every 8 (eight) hours as needed. 11/18/17   [provider]  atorvastatin (LIPITOR) 80 MG tablet Take 80 mg by mouth Nightly. 11/18/17   [provider]  HYDROcodone-acetaminophen (NORCO/VICODIN) 5-325 MG tablet Take 1 tablet by mouth every 6 (six) hours as needed for moderate pain.    [provider]  midodrine (PROAMATINE) 2.5 MG tablet Take 2.5 mg by mouth 3 (three) times daily with meals.    [provider]  oxyCODONE-acetaminophen (PERCOCET) 5-325 MG tablet Take 1 tablet by mouth every 6 (six) hours as needed for severe pain. 07/31/18 07/31/19  Nena Polio, MD  pregabalin  (LYRICA) 25 MG capsule Take 25 mg by mouth Nightly. 11/02/17   [provider]  warfarin (COUMADIN) 2.5 MG tablet Take 2.5 mg by mouth daily. Take 1 tablet by mouth sat/t/th, take 2 tablets m/w/f/sun 11/18/17 11/16/18  [provider]    Allergies Patient has no known allergies.  No family history on file.  Social History Social History   Tobacco Use  . Smoking status: Never Smoker  . Smokeless tobacco: Never Used  Substance Use Topics  . Alcohol use: Never    Frequency: Never  . Drug use: Never    Review of Systems Constitutional: No fever/chills Eyes: No visual changes. ENT: No sore throat. Cardiovascular: Denies chest pain. Respiratory: Denies shortness of breath. Gastrointestinal: No abdominal pain.  No nausea, no vomiting.  No diarrhea.  No constipation. Genitourinary: Negative for dysuria. Musculoskeletal: Negative for neck pain.  Negative for back pain. Integumentary: Negative for rash. Neurological: Negative for headaches, focal weakness or numbness.  Positive for generalized weakness   ____________________________________________   PHYSICAL EXAM:  VITAL SIGNS: ED Triage Vitals  Enc Vitals Group     BP 11/09/18 0215 (!) 146/70     Pulse Rate 11/09/18 0218 77     Resp 11/09/18 0215 19     Temp 11/09/18 0218 97.9 F (36.6 C)     Temp Source 11/09/18 0218 Oral     SpO2 11/09/18 0218 97 %     Weight --      Height --  Head Circumference --      Peak Flow --      Pain Score 11/09/18 0213 5     Pain Loc --      Pain Edu? --      Excl. in [place]? --     Constitutional: Alert and oriented.  Eyes: Conjunctivae are normal.  Mouth/Throat: Mucous membranes are moist. Neck: No stridor.  No meningeal signs.   Cardiovascular: Normal rate, regular rhythm. Good peripheral circulation. Grossly normal heart sounds. Respiratory: Normal respiratory effort.  No retractions. Gastrointestinal: Soft and nontender. No distention.  Musculoskeletal: No  lower extremity tenderness nor edema. No gross deformities of extremities. Neurologic:  Normal speech and language. No gross focal neurologic deficits are appreciated.  Skin:  Skin is warm, dry and intact. Psychiatric: Mood and affect are normal. Speech and behavior are normal.  ____________________________________________   LABS (all labs ordered are listed, but only abnormal results are displayed)  Labs Reviewed  CBC - Abnormal; Notable for the following components:      Result Value   RBC 3.52 (*)    Hemoglobin 10.5 (*)    HCT 34.3 (*)    RDW 17.5 (*)    Platelets 138 (*)    All other components within normal limits  COMPREHENSIVE METABOLIC PANEL - Abnormal; Notable for the following components:   Potassium 7.3 (*)    CO2 18 (*)    Glucose, Bld 140 (*)    BUN 96 (*)    Creatinine, Ser 11.89 (*)    AST 8 (*)    GFR calc non Af Amer 4 (*)    GFR calc Af Amer 4 (*)    Anion gap 18 (*)    All other components within normal limits  TROPONIN I (HIGH SENSITIVITY) - Abnormal; Notable for the following components:   Troponin I (High Sensitivity) 53 (*)    All other components within normal limits  SARS CORONAVIRUS 2 (HOSPITAL ORDER, [place] [facility])   ____________________________________________  EKG  ED ECG REPORT I, [place] Alicia Ackert, the attending physician, personally viewed and interpreted this ECG.   Date: 11/09/2018  EKG Time: 2:18 AM  Rate: 76  Rhythm: Normal sinus rhythm with peaked T waves  Axis: Normal  Intervals: Normal  ST&T Change: None  __________________________  PROCEDURES    .Critical Care Performed by: Gregor Hams, MD Authorized by: Gregor Hams, MD   Critical care provider statement:    Critical care time (minutes):  30   Critical care time was exclusive of:  Separately billable procedures and treating other patients (Hyperkalemia)   Critical care was necessary to treat or prevent imminent or life-threatening  deterioration of the following conditions:  Metabolic crisis   Critical care was time spent personally by me on the following activities:  Development of treatment plan with patient or surrogate, discussions with consultants, evaluation of patient's response to treatment, examination of patient, obtaining history from patient or surrogate, ordering and performing treatments and interventions, ordering and review of laboratory studies, ordering and review of radiographic studies, pulse oximetry, re-evaluation of patient's condition and review of old charts     ____________________________________________   [place] / MDM / [place] / ED COURSE  As part of my medical decision making, I reviewed the following data within the electronic MEDICAL RECORD NUMBER  73 year old male presented with above-stated history and physical exam secondary to generalized weakness.  Given the fact that the patient has not  received dialysis in greater than a week and a half, EKG revealing peaked T waves concern for possible hyperkalemia which was confirmed on laboratory data which revealed a potassium of 7.3.  Patient given calcium gluconate 1 g IV, sodium bicarb 50 g IV, insulin 5 units IV D50 12.5 g IV.  Patient also given Lokelma 10 g.  Patient discussed with Dr. Zollie Scale nephrologist for emergent dialysis.  Patient also discussed with Dr. Marcille Blanco for hospital admission. ____________________________________________  FINAL CLINICAL IMPRESSION(S) / ED DIAGNOSES  Final diagnoses:  Hyperkalemia     MEDICATIONS GIVEN DURING THIS VISIT:  Medications  calcium gluconate inj 10% (1 g) URGENT USE ONLY! (1 g Intravenous Given 11/09/18 0319)  insulin aspart (novoLOG) injection 5 Units (5 Units Intravenous Given 11/09/18 0319)  dextrose 50 % solution 12.5 g (12.5 g Intravenous Given 11/09/18 0319)  sodium bicarbonate injection 50 mEq (50 mEq Intravenous Given 11/09/18 0318)  sodium zirconium cyclosilicate (LOKELMA)  packet 10 g (10 g Oral Given 11/09/18 0334)     ED Discharge Orders    None      *Please note:  Antonio Phillips was evaluated in Emergency Department on 11/09/2018 for the symptoms described in the history of present illness. He was evaluated in the context of the global COVID-19 pandemic, which necessitated consideration that the patient might be at risk for infection with the SARS-CoV-2 virus that causes COVID-19. Institutional protocols and algorithms that pertain to the evaluation of patients at risk for COVID-19 are in a state of rapid change based on information released by regulatory bodies including the CDC and federal and state organizations. These policies and algorithms were followed during the patient's care in the ED.  Some ED evaluations and interventions may be delayed as a result of limited staffing during the pandemic.*  Note:  This document was prepared using Dragon voice recognition software and may include unintentional dictation errors.   Gregor Hams, MD 11/09/18 [phone]

## 2018-11-09 NOTE — ED Notes (Signed)
Date and time results received: 11/09/18 n  (use smartphrase ".now" to insert current time)  Test: potassium Critical Value: 7.3  Name of Provider Notified: brown  Orders Received? Or Actions Taken?: Orders Received - See Orders for details

## 2018-11-09 NOTE — Progress Notes (Signed)
Pre HD Tx  11/09/18 0430  Hand-Off documentation  Report given to (Full Name) Beatris Ship, RN   Report received from (Full Name) Jena Gauss, RN   Vital Signs  Temp 98 F (36.7 C)  Temp Source Oral  Pulse Rate 68  Pulse Rate Source Monitor  Resp 17  BP (!) 165/75  BP Location Right Arm  BP Method Automatic  Patient Position (if appropriate) Lying  Oxygen Therapy  SpO2 92 %  O2 Device Room Air  Pulse Oximetry Type Continuous  Pain Assessment  Pain Scale 0-10  Pain Score 0  Dialysis Weight  Weight  (unable to assess pt on ED stretcher, double BKA )  Type of Weight Pre-Dialysis  Time-Out for Hemodialysis  What Procedure? HD   Pt Identifiers(min of two) First/Last Name;MRN/Account#  Correct Site? Yes  Correct Side? Yes  Correct Procedure? Yes  Consents Verified? Yes  Rad Studies Available? N/A  Safety Precautions Reviewed? Yes  Engineer, civil (consulting) Number 7  Station Number 3  UF/Alarm Test Passed  Conductivity: Meter 13.8  Conductivity: Machine  13.9  pH 7.4  Reverse Osmosis Main  Normal Saline Lot Number [id number]  Dialyzer Lot Number [id number]  Disposable Set Lot Number [id number]  Machine Temperature 98.6 F (37 C)  Musician and Audible Yes  Blood Lines Intact and Secured Yes  Pre Treatment Patient Checks  Vascular access used during treatment Fistula  Hepatitis B Surface Antigen Results Pending  Isolation Initiated Yes  Hepatitis B Surface Antibody  (<10)  Date Hepatitis B Surface Antibody Drawn 02/22/18  Hemodialysis Consent Verified Yes  Hemodialysis Standing Orders Initiated Yes  ECG (Telemetry) Monitor On Yes  Prime Ordered Normal Saline  Length of  DialysisTreatment -hour(s) 3.5 Hour(s)  Dialysis Treatment Comments  (1K for 1st 2 hrs, 2k remainder of tx, Na140)  Dialyzer Elisio 17H NR  Dialysate 1K;2.5 Ca  Dialysis Anticoagulant None  Dialysate Flow Ordered 800  Blood Flow Rate Ordered 400 mL/min  Ultrafiltration Goal 2 Liters  Pre  Treatment Labs Phosphorus;CBC;Renal panel  Dialysis Blood Pressure Support Ordered Normal Saline  Fistula / Graft Left Upper arm Arteriovenous fistula  No Placement Date or Time found.   Orientation: Left  Access Location: Upper arm  Access Type: Arteriovenous fistula  Site Condition No complications  Fistula / Graft Assessment Present;Thrill;Bruit  Status Patent  Drainage Description None

## 2018-11-09 NOTE — ED Notes (Signed)
Pt requesting to wear his Cpap machine at this time. Told pt not to go to sleep but wait till the MD came in. This Rn wants to not damage the pt machine.

## 2018-11-09 NOTE — TOC Initial Note (Signed)
Transition of Care [facility]) - Initial/Assessment Note    Patient Details  Name: Antonio Phillips MRN: [id number] Date of Birth: Apr 20, 1945  Transition of [facility]) CM/SW Contact:    Beverly Sessions, RN Phone Number: 11/09/2018, 4:19 PM  Clinical Narrative:                 Patient admitted for hyperkalemia   Patient has been staying at [facility] [address] [facility] [address] motel room [address] for the last 2 months.  Patient states that [employer] pays for his hotel stays.  Patient states that he is very happy there, the people are nice, he has a lot of room, and it accommodates his wheelchair.   Patient states that he has no family.  Patient states that he has a "buddy" named Truman Hayward who helps runs errands for him if needed.  Patient was [facility].  Patient states he will not be returning there under any circumstances.  Patient states that an employee at the clinic offered him a nice place to stay rent free to help him out, but that he was told she would be fired.  Patient states, if that is how they are going to do things when someone is trying to help me, im not going back  Patient agreeable to be set up at [facility].  Dr Holley Raring and [name] HD liaison aware. Due to the holiday weekend the earliest the patient would be able to start would be Wednesday of next week  Patient states his back up place is "ill just go down to the [place] to get dialysis"  Patient denies any issues with transportation. "[facility] transportation takes me where ever I want to go".  Patient states that he has a WC, and cpap at home.  Patient states that he is working with [employer] in [place] to be fitted for prosthetics    PCP at [facility].  Obtains all of his medications from [place].  Patient denies any issues obtaining medications.   Patient agreeable to [facility] transport.  VA notified by [email or web address] Consent form signed by patient, and medical form completed.  Fax to AOD office FORM STILL NEEDS TO  BE SIGNED BY MD, and refaxed  MD notified, and flagged on chart.    Expected Discharge Plan: Home/Self Care Barriers to Discharge: Continued Medical Work up   Patient Goals and CMS Choice        Expected Discharge Plan and Services Expected Discharge Plan: Home/Self Care       Living arrangements for the past 2 months: Hotel/Motel                                      Prior Living Arrangements/Services Living arrangements for the past 2 months: Hotel/Motel Lives with:: Self Patient language and need for interpreter reviewed:: Yes Do you feel safe going back to the place where you live?: Yes            Criminal Activity/Legal Involvement Pertinent to Current Situation/Hospitalization: No - Comment as needed  Activities of Daily Living Home Assistive Devices/Equipment: Wheelchair, Other (Comment)(BIL BKA) ADL Screening (condition at time of admission) Patient's cognitive ability adequate to safely complete daily activities?: Yes Is the patient deaf or have difficulty hearing?: No Does the patient have difficulty seeing, even when wearing glasses/contacts?: No Does the patient have difficulty concentrating, remembering, or making decisions?: No Patient able to  express need for assistance with ADLs?: Yes Does the patient have difficulty dressing or bathing?: Yes Independently performs ADLs?: No Communication: Independent Dressing (OT): Needs assistance Is this a change from baseline?: Pre-admission baseline Grooming: Needs assistance Is this a change from baseline?: Pre-admission baseline Feeding: Independent Bathing: Needs assistance Is this a change from baseline?: Pre-admission baseline Toileting: Needs assistance Is this a change from baseline?: Pre-admission baseline In/Out Bed: Needs assistance Is this a change from baseline?: Pre-admission baseline Walks in Home: Needs assistance Is this a change from baseline?: Pre-admission baseline Does the  patient have difficulty walking or climbing stairs?: Yes Weakness of Legs: None(BIL BKA) Weakness of Arms/Hands: None  Permission Sought/Granted                  Emotional Assessment           Psych Involvement: No (comment)  Admission diagnosis:  Hyperkalemia [E87.5] Patient Active Problem List   Diagnosis Date Noted  . Hyperkalemia 04/25/2018  . UTI (urinary tract infection) 04/25/2018  . Diabetes ([place]) 04/25/2018  . ESRD on dialysis [facility]) 04/25/2018   PCP:  Marsh Dolly, MD Pharmacy:   [place], [address] [place] [address] Phone: [phone] Fax: [phone]     Social Determinants of Health (SDOH) Interventions    Readmission Risk Interventions Readmission Risk Prevention Plan 11/09/2018  Transportation Screening Complete  Medication Review (RN Care Manager) Complete  Some recent data might be hidden

## 2018-11-10 LAB — BASIC METABOLIC PANEL
Anion gap: 16 — ABNORMAL HIGH (ref 5–15)
BUN: 51 mg/dL — ABNORMAL HIGH (ref 8–23)
CO2: 22 mmol/L (ref 22–32)
Calcium: 8.5 mg/dL — ABNORMAL LOW (ref 8.9–10.3)
Chloride: 102 mmol/L (ref 98–111)
Creatinine, Ser: 7.63 mg/dL — ABNORMAL HIGH (ref 0.61–1.24)
GFR calc Af Amer: 7 mL/min — ABNORMAL LOW (ref >60)
GFR calc non Af Amer: 6 mL/min — ABNORMAL LOW (ref >60)
Glucose, Bld: 93 mg/dL (ref 70–99)
Potassium: 5.1 mmol/L (ref 3.5–5.1)
Sodium: 140 mmol/L (ref 135–145)

## 2018-11-10 LAB — GLUCOSE, CAPILLARY
Glucose-Capillary: 83 mg/dL (ref 70–99)
Glucose-Capillary: 140 mg/dL — ABNORMAL HIGH (ref 70–99)

## 2018-11-10 LAB — HEMOGLOBIN A1C
Hgb A1c MFr Bld: 5.5 % (ref 4.8–5.6)
Mean Plasma Glucose: 111.15 mg/dL

## 2018-11-10 LAB — PROTIME-INR
INR: 1.4 — ABNORMAL HIGH (ref 0.8–1.2)
Prothrombin Time: 17 seconds — ABNORMAL HIGH (ref 11.4–15.2)

## 2018-11-10 LAB — HEPATITIS B SURFACE ANTIGEN: Hepatitis B Surface Ag: NEGATIVE

## 2018-11-10 MED ORDER — INSULIN ASPART 100 UNIT/ML ~~LOC~~ SOLN: 0.0000 [IU] | Freq: Every day | SUBCUTANEOUS | Status: DC

## 2018-11-10 MED ORDER — INSULIN ASPART 100 UNIT/ML ~~LOC~~ SOLN
0.0000 [IU] | Freq: Three times a day (TID) | SUBCUTANEOUS | Status: DC
  Filled 2018-11-10: qty 1

## 2018-11-10 MED ORDER — WARFARIN SODIUM 6 MG PO TABS
6.0000 mg | ORAL_TABLET | Freq: Once | ORAL | Status: AC
  Administered 2018-11-10: 17:00:00 6 mg via ORAL
  Filled 2018-11-10: qty 1

## 2018-11-10 NOTE — Progress Notes (Signed)
hD tx initiated, no s/s of distress tolerating well   11/10/18 1140  Vital Signs  Pulse Rate 67  Pulse Rate Source Monitor  Resp 16  BP (!) 115/53  BP Location Right Arm  BP Method Automatic  Patient Position (if appropriate) Lying  Oxygen Therapy  SpO2 90 %  O2 Device Room Air  During Hemodialysis Assessment  Blood Flow Rate (mL/min) 400 mL/min  Arterial Pressure (mmHg) -150 mmHg  Venous Pressure (mmHg) 140 mmHg  Transmembrane Pressure (mmHg) 80 mmHg  Ultrafiltration Rate (mL/min) 570 mL/min  Dialysate Flow Rate (mL/min) 800 ml/min  Conductivity: Machine  14  HD Safety Checks Performed Yes  Dialysis Fluid Bolus Normal Saline  Bolus Amount (mL) 250 mL  Intra-Hemodialysis Comments Tx initiated  Fistula / Graft Left Upper arm Arteriovenous fistula  No Placement Date or Time found.   Orientation: Left  Access Location: Upper arm  Access Type: Arteriovenous fistula  Site Condition No complications  Fistula / Graft Assessment Present;Thrill;Bruit  Status Accessed  Needle Size 15  Drainage Description None

## 2018-11-10 NOTE — Consult Note (Signed)
ANTICOAGULATION CONSULT NOTE  Pharmacy Consult for warfarin dose management Indication: atrial fibrillation  No Known Allergies   Vital Signs: Temp: 97.5 F (36.4 C) (09/05 0338) Temp Source: Oral (09/05 0338) BP: 109/73 (09/05 0338) Pulse Rate: 57 (09/05 0338)  Labs: Recent Labs    11/09/18 0219 11/09/18 0607 11/10/18 0643  HGB 10.5* 9.1*  --   HCT 34.3* 29.0*  --   PLT 138* 134*  --   LABPROT 19.2*  --  17.0*  INR 1.6*  --  1.4*  CREATININE 11.89* 7.83* 7.63*  TROPONINIHS 53*  --   --     Estimated Creatinine Clearance: 10.2 mL/min (A) (by C-G formula based on SCr of 7.63 mg/dL (H)).   Medical History: Past Medical History:  Diagnosis Date  . Atrial fibrillation ([place])   . Diabetes ([place])   . ESRD (end stage renal disease) ([place])     Medications:  Scheduled:  . aspirin EC  81 mg Oral Daily  . atorvastatin  80 mg Oral Nightly  . Chlorhexidine Gluconate Cloth  6 each Topical Q0600  . docusate sodium  100 mg Oral BID  . heparin  5,000 Units Subcutaneous Q8H  . midodrine  2.5 mg Oral TID WC  . pregabalin  25 mg Oral QHS  . Warfarin - Pharmacist Dosing Inpatient   Does not apply q1800    Assessment: Pharmacy consulted for warfarin dosing and monitoring for 73 yo male with PMH of atrial fibrillation. Patient was taking warfarin PTA. H&H, PLT trending down, continue to follow  Home Regimen: warfarin 2.5mg : Tues, Thurs, Sat                             Warfarin 5mg : Mon, Wed, Fri, Sun  Date INR Warfarin Dose  9/4 1.6 6 mg  9/5 1.4 6 mg    Goal of Therapy:  INR 2-3 Monitor platelets by anticoagulation protocol: Yes   Plan:   Baseline INR subtherapeutic  6 mg dose tonight: this dose is 50 % greater than his average home dose  INR in am to guide dosing   CBC at least every 3 days per protocol  Oswald Hillock, PharmD, BCPS 11/10/2018,10:00 AM

## 2018-11-10 NOTE — Progress Notes (Signed)
Pre HD treatment   11/10/18 1130  Hand-Off documentation  Report given to (Full Name) Sherren Mocha  Report received from (Full Name) Charlynn Court  Vital Signs  Temp 97.9 F (36.6 C)  Temp Source Oral  Pulse Rate 64  Pulse Rate Source Monitor  Resp 15  BP (!) 121/51  BP Location Right Arm  BP Method Automatic  Patient Position (if appropriate) Lying  Oxygen Therapy  SpO2 91 %  O2 Device Room Air  Pain Assessment  Pain Scale 0-10  Pain Score 0  Time-Out for Hemodialysis  What Procedure? HD  Pt Identifiers(min of two) First/Last Name;MRN/Account#  Correct Site? Yes  Correct Side? Yes  Correct Procedure? Yes  Consents Verified? Yes  Rad Studies Available? N/A  Safety Precautions Reviewed? Yes  Engineer, civil (consulting) Number 7  Station Number 2  UF/Alarm Test Passed  Conductivity: Meter 13.8  Conductivity: Machine  13.8  pH 7.4  Reverse Osmosis main  Normal Saline Lot Number [id number]  Dialyzer Lot Number [id number]  Disposable Set Lot Number [id number]  Dialysate Acid Bath Lot Number [id number]  Dialysate HCO3 Bath Lot Number [id number]  Machine Temperature 98.6 F (37 C)  Musician and Audible Yes  Blood Lines Intact and Secured Yes  Pre Treatment Patient Checks  Vascular access used during treatment Fistula  Hepatitis B Surface Antigen Results Negative  Date Hepatitis B Surface Antigen Drawn 11/09/18  Date Hepatitis B Surface Antibody Drawn 02/22/18  Hemodialysis Consent Verified Yes  Hemodialysis Standing Orders Initiated Yes  ECG (Telemetry) Monitor On Yes  Prime Ordered Normal Saline  Length of  DialysisTreatment -hour(s) 3.5 Hour(s)  Dialysis Treatment Comments arrived to unit no s/s of distress to note stable for dialysis  Dialyzer Elisio 17H NR  Dialysate 2K;2.5 Ca  Dialysis Anticoagulant None  Dialysate Flow Ordered 800  Blood Flow Rate Ordered 400 mL/min  Ultrafiltration Goal 1.5 Liters  Dialysis Blood Pressure Support Ordered Normal Saline

## 2018-11-10 NOTE — Progress Notes (Signed)
TREATMENT COMPLETED TOLERATED WELL REMOVED 1.5l OFLUID   11/10/18 1515  Hand-Off documentation  Report given to (Full Name) MATTHEW WRIGHT  Report received from (Full Name) Blanche Gallien  Vital Signs  Temp 97.9 F (36.6 C)  Temp Source Oral  Pulse Rate (!) 55  Pulse Rate Source Monitor  Resp 15  BP 129/77  BP Location Right Arm  BP Method Automatic  Patient Position (if appropriate) Lying  Oxygen Therapy  SpO2 98 %  O2 Device Nasal Cannula  O2 Flow Rate (L/min) 2 L/min  Pain Assessment  Pain Scale 0-10  Pain Score 0  During Hemodialysis Assessment  Blood Flow Rate (mL/min) 150 mL/min  Arterial Pressure (mmHg) -10 mmHg  Venous Pressure (mmHg) 50 mmHg  Transmembrane Pressure (mmHg) 80 mmHg  Ultrafiltration Rate (mL/min) 0 mL/min  Dialysate Flow Rate (mL/min) 800 ml/min  Conductivity: Machine  14  HD Safety Checks Performed Yes  Dialysis Fluid Bolus Normal Saline  Bolus Amount (mL) 250 mL  Intra-Hemodialysis Comments Tx completed;Tolerated well  Post-Hemodialysis Assessment  Rinseback Volume (mL) 250 mL  KECN 80.6 V  Dialyzer Clearance Lightly streaked  Duration of HD Treatment -hour(s) 3.5 hour(s)  Hemodialysis Intake (mL) 700 mL  UF Total -Machine (mL) 2200 mL  Net UF (mL) 1500 mL  Tolerated HD Treatment Yes  AVG/AVF Arterial Site Held (minutes) 5 minutes  AVG/AVF Venous Site Held (minutes) 5 minutes  Education / Care Plan  Dialysis Education Provided Yes  Documented Education in Care Plan Yes  Fistula / Graft Left Upper arm Arteriovenous fistula  No Placement Date or Time found.   Orientation: Left  Access Location: Upper arm  Access Type: Arteriovenous fistula  Site Condition No complications  Fistula / Graft Assessment Present;Thrill;Bruit  Status Deaccessed  Drainage Description None

## 2018-11-10 NOTE — Progress Notes (Signed)
Pre HD  assignment   11/10/18 1130  Neurological  Level of Consciousness Alert  Orientation Level Oriented X4  Respiratory  Respiratory Pattern Regular;Unlabored  Chest Assessment Chest expansion symmetrical  Bilateral Breath Sounds Clear  Cardiac  Pulse Irregular  Heart Sounds No adventitious heart sounds  Jugular Venous Distention (JVD) No  ECG Monitor Yes  Vascular  R Radial Pulse +2  L Radial Pulse +2  Integumentary  Integumentary (WDL) WDL  Musculoskeletal  Musculoskeletal (WDL) X  Generalized Weakness Yes  Gastrointestinal  Bowel Sounds Assessment Active  GU Assessment  Genitourinary (WDL) X  Genitourinary Symptoms Oliguria  Psychosocial  Psychosocial (WDL) WDL

## 2018-11-10 NOTE — Progress Notes (Signed)
[facility] [place] NAME: Antonio Phillips    MR#:  [id number]  DATE OF BIRTH:  [birth year]  SUBJECTIVE:   Denies shortness of breath.  Patient angry that he has to stay here till Monday for dialysis.  REVIEW OF SYSTEMS:    Review of Systems  Constitutional: Negative for fever, chills weight loss HENT: Negative for ear pain, nosebleeds, congestion, facial swelling, rhinorrhea, neck pain, neck stiffness and ear discharge.   Respiratory: Negative for cough, shortness of breath, wheezing  Cardiovascular: Negative for chest pain, palpitations and leg swelling.  Gastrointestinal: Negative for heartburn, abdominal pain, vomiting, diarrhea or consitpation Genitourinary: Negative for dysuria, urgency, frequency, hematuria Musculoskeletal: Negative for back pain or joint pain Neurological: Negative for dizziness, seizures, syncope, focal weakness,  numbness and headaches.  Hematological: Does not bruise/bleed easily.  Psychiatric/Behavioral: Negative for hallucinations, confusion, dysphoric mood    Tolerating Diet: yes      DRUG ALLERGIES:  No Known Allergies  VITALS:  Blood pressure 109/73, pulse (!) 57, temperature (!) 97.5 F (36.4 C), temperature source Oral, resp. rate 18, weight 99 kg, SpO2 94 %.  PHYSICAL EXAMINATION:  Constitutional: Appears well-developed and well-nourished. No distress. HENT: Normocephalic. Marland Kitchen Oropharynx is clear and moist.  Eyes: Conjunctivae and EOM are normal. PERRLA, no scleral icterus.  Neck: Normal ROM. Neck supple. No JVD. No tracheal deviation. CVS: RRR, S1/S2 +, no murmurs, no gallops, no carotid bruit.  Pulmonary: Effort and breath sounds normal, no stridor, rhonchi, wheezes, rales.  Abdominal: Soft. BS +,  no distension, tenderness, rebound or guarding.  Musculoskeletal: Normal range of motion. No edema and no tenderness.  Neuro: Alert. CN 2-12 grossly intact. No focal deficits. Skin: Skin is warm  and dry. No rash noted. Psychiatric: Normal mood and affect.      LABORATORY PANEL:   CBC Recent Labs  Lab 11/09/18 0607  WBC 6.4  HGB 9.1*  HCT 29.0*  PLT 134*   ------------------------------------------------------------------------------------------------------------------  Chemistries  Recent Labs  Lab 11/09/18 0219  11/10/18 0643  NA 137   < > 140  K 7.3*   < > 5.1  CL 101   < > 102  CO2 18*   < > 22  GLUCOSE 140*   < > 93  BUN 96*   < > 51*  CREATININE 11.89*   < > 7.63*  CALCIUM 8.9   < > 8.5*  AST 8*  --   --   ALT 11  --   --   ALKPHOS 87  --   --   BILITOT 0.5  --   --    < > = values in this interval not displayed.   ------------------------------------------------------------------------------------------------------------------  Cardiac Enzymes No results for input(s): TROPONINI in the last 168 hours. ------------------------------------------------------------------------------------------------------------------  RADIOLOGY:  Dg Chest 1 View  Result Date: 11/09/2018 CLINICAL DATA:  Diabetic EXAM: CHEST  1 VIEW COMPARISON:  12/03/2009 FINDINGS: Cardiomegaly. No consolidation or effusion. No pneumothorax. Slight accentuation of the basilar and perihilar interstitium. IMPRESSION: Cardiomegaly without overt failure. Perihilar and basilar interstitial prominence, possible bronchitic changes or interstitial inflammatory process. Electronically Signed   By: Donavan Foil M.D.   On: 11/09/2018 16:47     ASSESSMENT AND PLAN:   73 year old male with end-stage renal disease on hemodialysis Monday, Wednesday Friday who missed a couple dialysis sessions and presented to the emergency room with generalized weakness and tremors and found to have hyperkalemia.  1.  Severe hyperkalemia from missing  dialysis: Potassium has improved after dialysis sessions.   2.  End-stage renal disease on hemodialysis: Patient does not have outpatient dialysis set up until  Wednesday.  He will need to stay in the hospital to continue dialysis treatments through Monday.  3.  Diabetes: SSI and add A1c Not on medications at home, diet controlled    4.  PAF: Patient currently in normal sinus rhythm Continue Coumadin  5. OSA: Continue CPAP at night  Management plans discussed with the patient and he is in agreement.  CODE STATUS: full  TOTAL TIME TAKING CARE OF THIS PATIENT:  30 minutes.     POSSIBLE D/C monday, DEPENDING ON CLINICAL CONDITION.   Bettey Costa M.D on 11/10/2018 at 10:12 AM  Between 7am to 6pm - Pager - [phone] After 6pm go to [email or web address] - password [email or web address] [place]  Office  [phone]  CC: Primary care physician; Marsh Dolly, MD  Note: This dictation was prepared with Dragon dictation along with smaller phrase technology. Any transcriptional errors that result from this process are unintentional.

## 2018-11-10 NOTE — Progress Notes (Signed)
[facility]  ROUNDING NOTE   Subjective:  Patient apparently waiting transfer to the [place]. In the interim we have been working on outpatient hemodialysis center port placement. Tentatively he has been accepted at [facility].   Objective:  Vital signs in last 24 hours:  Temp:  [97.5 F (36.4 C)-98.8 F (37.1 C)] 97.5 F (36.4 C) (09/05 0338) Pulse Rate:  [57-68] 57 (09/05 0338) Resp:  [16-18] 18 (09/05 0338) BP: (109-121)/(53-73) 109/73 (09/05 0338) SpO2:  [94 %-99 %] 94 % (09/05 0338)  Weight change:  Filed Weights   11/09/18 0830 11/09/18 0930  Weight: 99 kg 99 kg    Intake/Output: I/O last 3 completed shifts: In: -  Out: 2600 [Urine:600; Other:2000]   Intake/Output this shift:  Total I/O In: -  Out: 200 [Urine:200]  Physical Exam: General: No acute distress  Head: Normocephalic, atraumatic. Moist oral mucosal membranes  Eyes: Anicteric  Neck: Supple, trachea midline  Lungs:  Clear to auscultation, normal effort  Heart: S1S2 no rubs  Abdomen:  Soft, nontender, bowel sounds present  Extremities: 1+ peripheral edema.  Neurologic: Awake, alert, following commands  Skin: No lesions  Access: LUE AVF    Basic Metabolic Panel: Recent Labs  Lab 11/09/18 0219 11/09/18 0607 11/09/18 0819 11/10/18 0643  NA 137 137  --  140  K 7.3* 5.0 3.6 5.1  CL 101 101  --  102  CO2 18* 19*  --  22  GLUCOSE 140* 114*  --  93  BUN 96* 65*  --  51*  CREATININE 11.89* 7.83*  --  7.63*  CALCIUM 8.9 8.6*  --  8.5*  PHOS  --  5.4*  5.5*  --   --     Liver Function Tests: Recent Labs  Lab 11/09/18 0219 11/09/18 0607  AST 8*  --   ALT 11  --   ALKPHOS 87  --   BILITOT 0.5  --   PROT 7.0  --   ALBUMIN 3.9 3.9   No results for input(s): LIPASE, AMYLASE in the last 168 hours. No results for input(s): AMMONIA in the last 168 hours.  CBC: Recent Labs  Lab 11/09/18 0219 11/09/18 0607  WBC 5.6 6.4  HGB 10.5* 9.1*  HCT 34.3* 29.0*  MCV 97.4 95.4   PLT 138* 134*    Cardiac Enzymes: No results for input(s): CKTOTAL, CKMB, CKMBINDEX, TROPONINI in the last 168 hours.  BNP: Invalid input(s): POCBNP  CBG: No results for input(s): GLUCAP in the last 168 hours.  Microbiology: Results for orders placed or performed during the hospital encounter of 11/09/18  SARS Coronavirus 2 [facility] order, Performed in [facility] hospital lab) Nasopharyngeal Nasopharyngeal Swab     Status: None   Collection Time: 11/09/18  2:19 AM   Specimen: Nasopharyngeal Swab  Result Value Ref Range Status   SARS Coronavirus 2 NEGATIVE NEGATIVE Final    Comment: (NOTE) If result is NEGATIVE SARS-CoV-2 target nucleic acids are NOT DETECTED. The SARS-CoV-2 RNA is generally detectable in upper and lower  respiratory specimens during the acute phase of infection. The lowest  concentration of SARS-CoV-2 viral copies this assay can detect is 250  copies / mL. A negative result does not preclude SARS-CoV-2 infection  and should not be used as the sole basis for treatment or other  patient management decisions.  A negative result may occur with  improper specimen collection / handling, submission of specimen other  than nasopharyngeal swab, presence of viral mutation(s)  within the  areas targeted by this assay, and inadequate number of viral copies  (<250 copies / mL). A negative result must be combined with clinical  observations, patient history, and epidemiological information. If result is POSITIVE SARS-CoV-2 target nucleic acids are DETECTED. The SARS-CoV-2 RNA is generally detectable in upper and lower  respiratory specimens dur ing the acute phase of infection.  Positive  results are indicative of active infection with SARS-CoV-2.  Clinical  correlation with patient history and other diagnostic information is  necessary to determine patient infection status.  Positive results do  not rule out bacterial infection or co-infection with other viruses. If  result is PRESUMPTIVE POSTIVE SARS-CoV-2 nucleic acids MAY BE PRESENT.   A presumptive positive result was obtained on the submitted specimen  and confirmed on repeat testing.  While 2019 novel coronavirus  (SARS-CoV-2) nucleic acids may be present in the submitted sample  additional confirmatory testing may be necessary for epidemiological  and / or clinical management purposes  to differentiate between  SARS-CoV-2 and other Sarbecovirus currently known to infect humans.  If clinically indicated additional testing with an alternate test  methodology [phone]) is advised. The SARS-CoV-2 RNA is generally  detectable in upper and lower respiratory sp ecimens during the acute  phase of infection. The expected result is Negative. Fact Sheet for Patients:  StrictlyIdeas.no Fact Sheet for Healthcare Providers: BankingDealers.co.za This test is not yet approved or cleared by the [place] FDA and has been authorized for detection and/or diagnosis of SARS-CoV-2 by FDA under an Emergency Use Authorization (EUA).  This EUA will remain in effect (meaning this test can be used) for the duration of the COVID-19 declaration under Section 564(b)(1) of the Act, 21 U.S.C. section 360bbb-3(b)(1), unless the authorization is terminated or revoked sooner. Performed at [facility], [address] [place], [place] [address]     Coagulation Studies: Recent Labs    11/09/18 0219 11/10/18 0643  LABPROT 19.2* 17.0*  INR 1.6* 1.4*    Urinalysis: No results for input(s): COLORURINE, LABSPEC, PHURINE, GLUCOSEU, HGBUR, BILIRUBINUR, KETONESUR, PROTEINUR, UROBILINOGEN, NITRITE, LEUKOCYTESUR in the last 72 hours.  Invalid input(s): APPERANCEUR    Imaging: Dg Chest 1 View  Result Date: 11/09/2018 CLINICAL DATA:  Diabetic EXAM: CHEST  1 VIEW COMPARISON:  12/03/2009 FINDINGS: Cardiomegaly. No consolidation or effusion. No pneumothorax. Slight  accentuation of the basilar and perihilar interstitium. IMPRESSION: Cardiomegaly without overt failure. Perihilar and basilar interstitial prominence, possible bronchitic changes or interstitial inflammatory process. Electronically Signed   By: Donavan Foil M.D.   On: 11/09/2018 16:47     Medications:    . aspirin EC  81 mg Oral Daily  . atorvastatin  80 mg Oral Nightly  . Chlorhexidine Gluconate Cloth  6 each Topical Q0600  . docusate sodium  100 mg Oral BID  . heparin  5,000 Units Subcutaneous Q8H  . insulin aspart  0-5 Units Subcutaneous QHS  . insulin aspart  0-9 Units Subcutaneous TID WC  . midodrine  2.5 mg Oral TID WC  . pregabalin  25 mg Oral QHS  . warfarin  6 mg Oral ONCE-1800  . Warfarin - Pharmacist Dosing Inpatient   Does not apply q1800   acetaminophen **OR** acetaminophen, ondansetron **OR** ondansetron (ZOFRAN) IV, oxyCODONE-acetaminophen  Assessment/ Plan:  73 y.o. 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   [facility]  nephrology/[facility]/left arm AV fistula/MWF  1.  ESRD on HD MWF.  Patient has missed dialysis for greater than 1 week.   -He is currently not established as an outpatient hemodialysis center as he states that he is no longer going back to [facility].  We have tentatively accepted him at [facility] however patient also awaiting transfer to the [place] in [place].  We will plan for dialysis today given the fact that he is missed many dialysis treatments.  2.  Severe hyperkalemia.  Potassium down to 5.1.  We plan for dialysis today as above..  3.  Secondary hyperparathyroidism.  Repeat serum phosphorus today.  4.  Anemia of chronic kidney disease.  Continue to periodically monitor CBC.   LOS: 1 Shabana Armentrout 9/5/202011:11 AM

## 2018-11-10 NOTE — TOC Progression Note (Signed)
Transition of Care [facility]) - Progression Note    Patient Details  Name: Antonio Phillips MRN: [id number] Date of Birth: February 18, 1946  Transition of Care [facility]) CM/SW Contact  Latanya Maudlin, RN Phone Number: 11/10/2018, 2:18 PM  Clinical Narrative:  [facility] not [facility] at [facility]. [facility] if patient is still in the hospital Monday they can re attempt transfer as Tuesday is the nearest time they will have a dialysis chair.      Expected Discharge Plan: Home/Self Care Barriers to Discharge: Continued Medical Work up  Expected Discharge Plan and Services Expected Discharge Plan: Home/Self Care       Living arrangements for the past 2 months: Hotel/Motel                                       Social Determinants of Health (SDOH) Interventions    Readmission Risk Interventions Readmission Risk Prevention Plan 11/09/2018  Transportation Screening Complete  Medication Review Press photographer) Complete  Some recent data might be hidden

## 2018-11-11 LAB — PROTIME-INR
INR: 1.5 — ABNORMAL HIGH (ref 0.8–1.2)
Prothrombin Time: 17.6 seconds — ABNORMAL HIGH (ref 11.4–15.2)

## 2018-11-11 LAB — GLUCOSE, CAPILLARY
Glucose-Capillary: 110 mg/dL — ABNORMAL HIGH (ref 70–99)
Glucose-Capillary: 123 mg/dL — ABNORMAL HIGH (ref 70–99)
Glucose-Capillary: 122 mg/dL — ABNORMAL HIGH (ref 70–99)

## 2018-11-11 LAB — PHOSPHORUS: Phosphorus: 4.6 mg/dL (ref 2.5–4.6)

## 2018-11-11 MED ORDER — POLYVINYL ALCOHOL 1.4 % OP SOLN
2.0000 [drp] | OPHTHALMIC | Status: DC | PRN
  Administered 2018-11-12: 2 [drp] via OPHTHALMIC
  Filled 2018-11-11: qty 15

## 2018-11-11 MED ORDER — WARFARIN SODIUM 6 MG PO TABS
6.0000 mg | ORAL_TABLET | Freq: Once | ORAL | Status: AC
  Administered 2018-11-11: 17:00:00 6 mg via ORAL
  Filled 2018-11-11: qty 1

## 2018-11-11 NOTE — Progress Notes (Signed)
[facility] [place] NAME: Antonio Phillips    MR#:  [id number]  DATE OF BIRTH:  20-Jul-1945  SUBJECTIVE:   Patient sleeping this am   REVIEW OF SYSTEMS:    Unable to obtain patient sleeping soundly   Tolerating Diet: yes      DRUG ALLERGIES:  No Known Allergies  VITALS:  Blood pressure (!) 114/46, pulse (!) 59, temperature 98.8 F (37.1 C), temperature source Oral, resp. rate 20, weight 107.2 kg, SpO2 97 %.  PHYSICAL EXAMINATION:  Constitutional: Appears obese No distress.CPAP HENT: Normocephalic. .  Neck: No tracheal deviation. CVS: RRR, S1/S2 +, no murmurs, no gallops, no carotid bruit.  Pulmonary: Effort and breath sounds normal, no stridor, rhonchi, wheezes, rales.  Abdominal: Soft. BS +,  no distension, tenderness, rebound or guarding.  Musculoskeletal:  No edema and no tenderness.  Neuro: sleeping soundly Skin: Skin is warm and dry. No rash noted. Psychiatric: sleeping    LABORATORY PANEL:   CBC Recent Labs  Lab 11/09/18 0607  WBC 6.4  HGB 9.1*  HCT 29.0*  PLT 134*   ------------------------------------------------------------------------------------------------------------------  Chemistries  Recent Labs  Lab 11/09/18 0219  11/10/18 0643  NA 137   < > 140  K 7.3*   < > 5.1  CL 101   < > 102  CO2 18*   < > 22  GLUCOSE 140*   < > 93  BUN 96*   < > 51*  CREATININE 11.89*   < > 7.63*  CALCIUM 8.9   < > 8.5*  AST 8*  --   --   ALT 11  --   --   ALKPHOS 87  --   --   BILITOT 0.5  --   --    < > = values in this interval not displayed.   ------------------------------------------------------------------------------------------------------------------  Cardiac Enzymes No results for input(s): TROPONINI in the last 168 hours. ------------------------------------------------------------------------------------------------------------------  RADIOLOGY:  Dg Chest 1 View  Result Date:  11/09/2018 CLINICAL DATA:  Diabetic EXAM: CHEST  1 VIEW COMPARISON:  12/03/2009 FINDINGS: Cardiomegaly. No consolidation or effusion. No pneumothorax. Slight accentuation of the basilar and perihilar interstitium. IMPRESSION: Cardiomegaly without overt failure. Perihilar and basilar interstitial prominence, possible bronchitic changes or interstitial inflammatory process. Electronically Signed   By: Donavan Foil M.D.   On: 11/09/2018 16:47     ASSESSMENT AND PLAN:   73 year old male with end-stage renal disease on hemodialysis Monday, Wednesday Friday who missed a couple dialysis sessions and presented to the emergency room with generalized weakness and tremors and found to have hyperkalemia.  1.  Severe hyperkalemia from missing dialysis: Potassium has improved after dialysis sessions.   2.  End-stage renal disease on hemodialysis: Patient does not have outpatient dialysis set up until Wednesday.  He will need to stay in the hospital to continue dialysis treatments through Monday.  3.  Diabetes: SSI and add A1c Not on medications at home, diet controlled    4.  PAF: Patient currently in normal sinus rhythm Continue Coumadin  5. OSA: Continue CPAP at night    CODE STATUS: full  TOTAL TIME TAKING CARE OF THIS PATIENT:  21 minutes.   Will return to talk to patient.  POSSIBLE D/C monday, DEPENDING ON CLINICAL CONDITION.   Bettey Costa M.D on 11/11/2018 at 7:35 AM  Between 7am to 6pm - Pager - [phone] After 6pm go to [email or web address] - password [email or web address]  [employer]  [phone]  CC: Primary care physician; Marsh Dolly, MD  Note: This dictation was prepared with Dragon dictation along with smaller phrase technology. Any transcriptional errors that result from this process are unintentional.

## 2018-11-11 NOTE — Progress Notes (Signed)
[facility]  ROUNDING NOTE   Subjective:  Patient has undergone dialysis for 2 consecutive days. Tolerated well.    Objective:  Vital signs in last 24 hours:  Temp:  [97.9 F (36.6 C)-98.8 F (37.1 C)] 98 F (36.7 C) (09/06 1141) Pulse Rate:  [35-65] 63 (09/06 1141) Resp:  [12-20] 17 (09/06 1141) BP: (96-129)/(44-77) 106/49 (09/06 1141) SpO2:  [87 %-99 %] 92 % (09/06 1141) Weight:  [107.2 kg] 107.2 kg (09/06 0527)  Weight change: 8.2 kg Filed Weights   11/09/18 0830 11/09/18 0930 11/11/18 0527  Weight: 99 kg 99 kg 107.2 kg    Intake/Output: I/O last 3 completed shifts: In: -  Out: 2450 [Urine:950; Other:1500]   Intake/Output this shift:  Total I/O In: 240 [P.O.:240] Out: 150 [Urine:150]  Physical Exam: General: No acute distress  Head: Normocephalic, atraumatic. Moist oral mucosal membranes  Eyes: Anicteric  Neck: Supple, trachea midline  Lungs:  Clear to auscultation, normal effort  Heart: S1S2 no rubs  Abdomen:  Soft, nontender, bowel sounds present  Extremities: 1+ peripheral edema.  Neurologic: Awake, alert, following commands  Skin: No lesions  Access: LUE AVF    Basic Metabolic Panel: Recent Labs  Lab 11/09/18 0219 11/09/18 0607 11/09/18 0819 11/10/18 0643 11/11/18 0509  NA 137 137  --  140  --   K 7.3* 5.0 3.6 5.1  --   CL 101 101  --  102  --   CO2 18* 19*  --  22  --   GLUCOSE 140* 114*  --  93  --   BUN 96* 65*  --  51*  --   CREATININE 11.89* 7.83*  --  7.63*  --   CALCIUM 8.9 8.6*  --  8.5*  --   PHOS  --  5.4*  5.5*  --   --  4.6    Liver Function Tests: Recent Labs  Lab 11/09/18 0219 11/09/18 0607  AST 8*  --   ALT 11  --   ALKPHOS 87  --   BILITOT 0.5  --   PROT 7.0  --   ALBUMIN 3.9 3.9   No results for input(s): LIPASE, AMYLASE in the last 168 hours. No results for input(s): AMMONIA in the last 168 hours.  CBC: Recent Labs  Lab 11/09/18 0219 11/09/18 0607  WBC 5.6 6.4  HGB 10.5* 9.1*  HCT 34.3*  29.0*  MCV 97.4 95.4  PLT 138* 134*    Cardiac Enzymes: No results for input(s): CKTOTAL, CKMB, CKMBINDEX, TROPONINI in the last 168 hours.  BNP: Invalid input(s): POCBNP  CBG: Recent Labs  Lab 11/10/18 1700 11/10/18 2102 11/11/18 0733 11/11/18 1142  GLUCAP 83 140* 110* 123*    Microbiology: Results for orders placed or performed during the hospital encounter of 11/09/18  SARS Coronavirus 2 [facility] order, Performed in [facility] hospital lab) Nasopharyngeal Nasopharyngeal Swab     Status: None   Collection Time: 11/09/18  2:19 AM   Specimen: Nasopharyngeal Swab  Result Value Ref Range Status   SARS Coronavirus 2 NEGATIVE NEGATIVE Final    Comment: (NOTE) If result is NEGATIVE SARS-CoV-2 target nucleic acids are NOT DETECTED. The SARS-CoV-2 RNA is generally detectable in upper and lower  respiratory specimens during the acute phase of infection. The lowest  concentration of SARS-CoV-2 viral copies this assay can detect is 250  copies / mL. A negative result does not preclude SARS-CoV-2 infection  and should not be used as the sole basis for  treatment or other  patient management decisions.  A negative result may occur with  improper specimen collection / handling, submission of specimen other  than nasopharyngeal swab, presence of viral mutation(s) within the  areas targeted by this assay, and inadequate number of viral copies  (<250 copies / mL). A negative result must be combined with clinical  observations, patient history, and epidemiological information. If result is POSITIVE SARS-CoV-2 target nucleic acids are DETECTED. The SARS-CoV-2 RNA is generally detectable in upper and lower  respiratory specimens dur ing the acute phase of infection.  Positive  results are indicative of active infection with SARS-CoV-2.  Clinical  correlation with patient history and other diagnostic information is  necessary to determine patient infection status.  Positive results do   not rule out bacterial infection or co-infection with other viruses. If result is PRESUMPTIVE POSTIVE SARS-CoV-2 nucleic acids MAY BE PRESENT.   A presumptive positive result was obtained on the submitted specimen  and confirmed on repeat testing.  While 2019 novel coronavirus  (SARS-CoV-2) nucleic acids may be present in the submitted sample  additional confirmatory testing may be necessary for epidemiological  and / or clinical management purposes  to differentiate between  SARS-CoV-2 and other Sarbecovirus currently known to infect humans.  If clinically indicated additional testing with an alternate test  methodology [phone]) is advised. The SARS-CoV-2 RNA is generally  detectable in upper and lower respiratory sp ecimens during the acute  phase of infection. The expected result is Negative. Fact Sheet for Patients:  StrictlyIdeas.no Fact Sheet for Healthcare Providers: BankingDealers.co.za This test is not yet approved or cleared by the [place] FDA and has been authorized for detection and/or diagnosis of SARS-CoV-2 by FDA under an Emergency Use Authorization (EUA).  This EUA will remain in effect (meaning this test can be used) for the duration of the COVID-19 declaration under Section 564(b)(1) of the Act, 21 U.S.C. section 360bbb-3(b)(1), unless the authorization is terminated or revoked sooner. Performed at [facility], [address] [place], [place] [address]     Coagulation Studies: Recent Labs    11/09/18 0219 11/10/18 0643 11/11/18 0509  LABPROT 19.2* 17.0* 17.6*  INR 1.6* 1.4* 1.5*    Urinalysis: No results for input(s): COLORURINE, LABSPEC, PHURINE, GLUCOSEU, HGBUR, BILIRUBINUR, KETONESUR, PROTEINUR, UROBILINOGEN, NITRITE, LEUKOCYTESUR in the last 72 hours.  Invalid input(s): APPERANCEUR    Imaging: Dg Chest 1 View  Result Date: 11/09/2018 CLINICAL DATA:  Diabetic EXAM: CHEST  1 VIEW  COMPARISON:  12/03/2009 FINDINGS: Cardiomegaly. No consolidation or effusion. No pneumothorax. Slight accentuation of the basilar and perihilar interstitium. IMPRESSION: Cardiomegaly without overt failure. Perihilar and basilar interstitial prominence, possible bronchitic changes or interstitial inflammatory process. Electronically Signed   By: Donavan Foil M.D.   On: 11/09/2018 16:47     Medications:    . aspirin EC  81 mg Oral Daily  . atorvastatin  80 mg Oral Nightly  . Chlorhexidine Gluconate Cloth  6 each Topical Q0600  . docusate sodium  100 mg Oral BID  . heparin  5,000 Units Subcutaneous Q8H  . insulin aspart  0-5 Units Subcutaneous QHS  . insulin aspart  0-9 Units Subcutaneous TID WC  . midodrine  2.5 mg Oral TID WC  . pregabalin  25 mg Oral QHS  . warfarin  6 mg Oral ONCE-1800  . Warfarin - Pharmacist Dosing Inpatient   Does not apply q1800   acetaminophen **OR** acetaminophen, ondansetron **OR** ondansetron (ZOFRAN) IV, oxyCODONE-acetaminophen  Assessment/ Plan:  73 y.o. 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   [facility]rology/[facility]/left arm AV fistula/MWF  1.  ESRD on HD MWF.  Patient has missed dialysis for greater than 1 week.   -Patient seen and evaluated at bedside. Has undergone dialysis 2 consecutive days. Hyperkalemia corrected. We will plan for dialysis again tomorrow as well.  2.  Severe hyperkalemia.  Corrected with consecutive days of dialysis.  Continue to monitor serum potassium.  3.  Secondary hyperparathyroidism.  Phosphorus 4.6 and acceptable.  4.  Anemia of chronic kidney disease.  Most recent hemoglobin was 9.1.  Consider starting the patient on Epogen as an outpatient.   LOS: 2 Antonio Phillips 9/6/20201:08 PM

## 2018-11-11 NOTE — Consult Note (Addendum)
ANTICOAGULATION CONSULT NOTE  Pharmacy Consult for warfarin dose management Indication: atrial fibrillation  No Known Allergies   Vital Signs: Temp: 98.8 F (37.1 C) (09/06 0526) Temp Source: Oral (09/06 0526) BP: 114/46 (09/06 0526) Pulse Rate: 59 (09/06 0526)  Labs: Recent Labs    11/09/18 0219 11/09/18 0607 11/10/18 0643 11/11/18 0509  HGB 10.5* 9.1*  --   --   HCT 34.3* 29.0*  --   --   PLT 138* 134*  --   --   LABPROT 19.2*  --  17.0* 17.6*  INR 1.6*  --  1.4* 1.5*  CREATININE 11.89* 7.83* 7.63*  --   TROPONINIHS 53*  --   --   --     Estimated Creatinine Clearance: 10.6 mL/min (A) (by C-G formula based on SCr of 7.63 mg/dL (H)).   Medical History: Past Medical History:  Diagnosis Date  . Atrial fibrillation ([place])   . Diabetes ([place])   . ESRD (end stage renal disease) ([place])     Medications:  Scheduled:  . aspirin EC  81 mg Oral Daily  . atorvastatin  80 mg Oral Nightly  . Chlorhexidine Gluconate Cloth  6 each Topical Q0600  . docusate sodium  100 mg Oral BID  . heparin  5,000 Units Subcutaneous Q8H  . insulin aspart  0-5 Units Subcutaneous QHS  . insulin aspart  0-9 Units Subcutaneous TID WC  . midodrine  2.5 mg Oral TID WC  . pregabalin  25 mg Oral QHS  . Warfarin - Pharmacist Dosing Inpatient   Does not apply q1800    Assessment: Pharmacy consulted for warfarin dosing and monitoring for 73 yo male with PMH of atrial fibrillation. Patient was taking warfarin PTA. H&H, PLT trending down, continue to follow. Baseline INR subtherapeutic   Pt says he has been homeless for over a year. Unsure if he has been compliant with taking medications and when he took his last warfarin dose.   Home Regimen: Warfarin 2.5mg : [place], Sat                             Warfarin 5mg : Mon, Wed, Fri, Sun  Date INR Warfarin Dose  9/4 1.6 6 mg  9/5 1.4 6mg   9/6 1.5     Goal of Therapy:  INR 2-3 Monitor platelets by anticoagulation protocol: Yes   Plan:    Baseline INR subtherapeutic  On SubQ heparin q8H while subtherapeutic (Currently not in A. Fib)   Warfarin 6 mg dose tonight  INR in am to guide dosing   CBC at least every 3 days per protocol  Pernell Dupre, PharmD, BCPS 11/11/2018,7:38 AM

## 2018-11-12 ENCOUNTER — Encounter: Payer: Self-pay | Admitting: *Deleted

## 2018-11-12 LAB — BASIC METABOLIC PANEL
Anion gap: 17 — ABNORMAL HIGH (ref 5–15)
BUN: 36 mg/dL — ABNORMAL HIGH (ref 8–23)
CO2: 23 mmol/L (ref 22–32)
Calcium: 8.7 mg/dL — ABNORMAL LOW (ref 8.9–10.3)
Chloride: 99 mmol/L (ref 98–111)
Creatinine, Ser: 6.89 mg/dL — ABNORMAL HIGH (ref 0.61–1.24)
GFR calc Af Amer: 8 mL/min — ABNORMAL LOW (ref >60)
GFR calc non Af Amer: 7 mL/min — ABNORMAL LOW (ref >60)
Glucose, Bld: 85 mg/dL (ref 70–99)
Potassium: 4 mmol/L (ref 3.5–5.1)
Sodium: 139 mmol/L (ref 135–145)

## 2018-11-12 LAB — PROTIME-INR
INR: 1.4 — ABNORMAL HIGH (ref 0.8–1.2)
Prothrombin Time: 17.4 seconds — ABNORMAL HIGH (ref 11.4–15.2)

## 2018-11-12 LAB — CBC
HCT: 29.2 % — ABNORMAL LOW (ref 39.0–52.0)
Hemoglobin: 8.9 g/dL — ABNORMAL LOW (ref 13.0–17.0)
MCH: 29.8 pg (ref 26.0–34.0)
MCHC: 30.5 g/dL (ref 30.0–36.0)
MCV: 97.7 fL (ref 80.0–100.0)
Platelets: 103 10*3/uL — ABNORMAL LOW (ref 150–400)
RBC: 2.99 MIL/uL — ABNORMAL LOW (ref 4.22–5.81)
RDW: 17.2 % — ABNORMAL HIGH (ref 11.5–15.5)
WBC: 3.8 10*3/uL — ABNORMAL LOW (ref 4.0–10.5)
nRBC: 0 % (ref 0.0–0.2)

## 2018-11-12 LAB — GLUCOSE, CAPILLARY
Glucose-Capillary: 93 mg/dL (ref 70–99)
Glucose-Capillary: 101 mg/dL — ABNORMAL HIGH (ref 70–99)
Glucose-Capillary: 141 mg/dL — ABNORMAL HIGH (ref 70–99)
Glucose-Capillary: 124 mg/dL — ABNORMAL HIGH (ref 70–99)

## 2018-11-12 MED ORDER — WARFARIN SODIUM 6 MG PO TABS
6.0000 mg | ORAL_TABLET | Freq: Once | ORAL | Status: AC
  Administered 2018-11-12: 6 mg via ORAL
  Filled 2018-11-12: qty 1

## 2018-11-12 MED ORDER — EPOETIN ALFA 10000 UNIT/ML IJ SOLN
10000.0000 [IU] | INTRAMUSCULAR | Status: DC
  Administered 2018-11-12 – 2018-11-13 (×2): 10000 [IU] via INTRAVENOUS
  Filled 2018-11-12 (×2): qty 1

## 2018-11-12 NOTE — Progress Notes (Signed)
Patient stated he takes Metoprolol, but unsure if dosage. Patient receives his medications from [place] and [facility]. Pharmacy has notated to then do a medication reconciliation with the patient tomorrow.   Fuller Mandril, RN

## 2018-11-12 NOTE — Progress Notes (Signed)
HD Tx completed, tolerated well, UF goal met, Pt had some episodes of asymptomatic low B/P which is expected as pt's baseline, pt is in no distress , tolerated TX well.    11/12/18 1154  Vital Signs  Pulse Rate 74  Pulse Rate Source Monitor  Resp 12  BP (!) 125/52  BP Location Right Arm  BP Method Automatic  Patient Position (if appropriate) Lying  Oxygen Therapy  SpO2 97 %  O2 Device Room Air  Pulse Oximetry Type Continuous  During Hemodialysis Assessment  HD Safety Checks Performed Yes  KECN 73.9 KECN  Dialysis Fluid Bolus Normal Saline  Bolus Amount (mL) 250 mL  Intra-Hemodialysis Comments Tx completed;Tolerated well

## 2018-11-12 NOTE — Progress Notes (Signed)
Post HD assessment    11/12/18 1205  Neurological  Level of Consciousness Alert  Orientation Level Oriented X4  Respiratory  Respiratory Pattern Regular;Unlabored  Chest Assessment Chest expansion symmetrical  Bilateral Breath Sounds Clear;Diminished  Cough None  Cardiac  Pulse Regular  Heart Sounds No adventitious heart sounds  Jugular Venous Distention (JVD) No  ECG Monitor Yes  Cardiac Rhythm SB  Vascular  R Radial Pulse +2  L Radial Pulse +2  Integumentary  Integumentary (WDL) WDL  Musculoskeletal  Musculoskeletal (WDL) X  Generalized Weakness Yes  Gastrointestinal  Bowel Sounds Assessment Active  GU Assessment  Genitourinary (WDL) X  Genitourinary Symptoms Oliguria  Psychosocial  Psychosocial (WDL) WDL  Patient Behaviors Calm;Cooperative  Needs Expressed Financial  Emotional support given Given to patient

## 2018-11-12 NOTE — Progress Notes (Signed)
Pre HD Tx   11/12/18 [id number]  Hand-Off documentation  Report given to (Full Name) Beatris Ship, RN   Report received from (Full Name) Ruthy Dick, RN   Vital Signs  Temp 98.4 F (36.9 C)  Temp Source Oral  Pulse Rate 64  Pulse Rate Source Monitor  BP (!) 141/69  BP Location Right Arm  BP Method Automatic  Patient Position (if appropriate) Lying  Oxygen Therapy  SpO2 100 %  O2 Device Room Air  Pulse Oximetry Type Continuous  Pain Assessment  Pain Scale 0-10  Pain Score 0  Dialysis Weight  Weight 109.5 kg  Type of Weight Pre-Dialysis  Time-Out for Hemodialysis  What Procedure? HD  Pt Identifiers(min of two) First/Last Name;MRN/Account#  Correct Site? Yes  Correct Side? Yes  Correct Procedure? Yes  Consents Verified? Yes  Rad Studies Available? N/A  Safety Precautions Reviewed? Yes  Engineer, civil (consulting) Number 7  Station Number 2  UF/Alarm Test Passed  Conductivity: Meter 114  Conductivity: Machine  13.9  pH 7.4  Reverse Osmosis Main  Normal Saline Lot Number [id number]  Dialyzer Lot Number [id number]  Disposable Set Lot Number [id number]  Machine Temperature 98.6 F (37 C)  Musician and Audible Yes  Blood Lines Intact and Secured Yes  Pre Treatment Patient Checks  Vascular access used during treatment Fistula  Hepatitis B Surface Antigen Results Negative  Date Hepatitis B Surface Antigen Drawn 11/09/18  Hepatitis B Surface Antibody  (<10)  Date Hepatitis B Surface Antibody Drawn 02/22/18  Hemodialysis Consent Verified Yes  Hemodialysis Standing Orders Initiated Yes  ECG (Telemetry) Monitor On Yes  Prime Ordered Normal Saline  Length of  DialysisTreatment -hour(s) 3.5 Hour(s)  Dialysis Treatment Comments Na 140  Dialyzer Elisio 17H NR  Dialysate 2K;2.5 Ca  Dialysis Anticoagulant None  Dialysate Flow Ordered 800  Blood Flow Rate Ordered 400 mL/min  Ultrafiltration Goal 1.5 Liters  Pre Treatment Labs Phosphorus  Dialysis Blood Pressure Support Ordered  Normal Saline  Education / Care Plan  Dialysis Education Provided Yes  Documented Education in Care Plan Yes  Fistula / Graft Left Upper arm Arteriovenous fistula  No Placement Date or Time found.   Orientation: Left  Access Location: Upper arm  Access Type: Arteriovenous fistula  Site Condition No complications  Fistula / Graft Assessment Present;Thrill;Bruit  Status Patent

## 2018-11-12 NOTE — Progress Notes (Signed)
Post HD Tx    11/12/18 1200  Hand-Off documentation  Report given to (Full Name) Ruthy Dick, RN   Report received from (Full Name) Beatris Ship, RN   Vital Signs  Temp 98.4 F (36.9 C)  Temp Source Oral  Pulse Rate 62  Pulse Rate Source Monitor  Resp 12  BP (!) 118/47  BP Location Right Arm  BP Method Automatic  Patient Position (if appropriate) Lying  Oxygen Therapy  SpO2 95 %  O2 Device Room Air  Pulse Oximetry Type Continuous  Pain Assessment  Pain Scale 0-10  Pain Score 0  Post-Hemodialysis Assessment  Rinseback Volume (mL) 250 mL  KECN 73.9 V  Dialyzer Clearance Lightly streaked  Duration of HD Treatment -hour(s) 3.5 hour(s)  Hemodialysis Intake (mL) 500 mL  UF Total -Machine (mL) 2022 mL  Net UF (mL) 1522 mL  Tolerated HD Treatment Yes  AVG/AVF Arterial Site Held (minutes) 5 minutes  AVG/AVF Venous Site Held (minutes) 5 minutes  Fistula / Graft Left Upper arm Arteriovenous fistula  No Placement Date or Time found.   Orientation: Left  Access Location: Upper arm  Access Type: Arteriovenous fistula  Site Condition No complications  Fistula / Graft Assessment Present;Thrill;Bruit  Status Deaccessed  Drainage Description None

## 2018-11-12 NOTE — Progress Notes (Signed)
[facility]  ROUNDING NOTE   Subjective:  Patient seen and evaluated during hemodialysis. Tolerating well. Ultrafiltration target 1.5 kg.  Objective:  Vital signs in last 24 hours:  Temp:  [98 F (36.7 C)-98.6 F (37 C)] 98.6 F (37 C) (09/07 0454) Pulse Rate:  [50-75] 64 (09/07 0830) Resp:  [15-20] 15 (09/07 0830) BP: (106-151)/(49-69) 136/60 (09/07 0830) SpO2:  [92 %-100 %] 99 % (09/07 0830) Weight:  [109.5 kg] 109.5 kg (09/07 0812)  Weight change:  Filed Weights   11/09/18 0930 11/11/18 0527 11/12/18 0812  Weight: 99 kg 107.2 kg 109.5 kg    Intake/Output: I/O last 3 completed shifts: In: 360 [P.O.:360] Out: 1175 [Urine:1175]   Intake/Output this shift:  Total I/O In: 120 [P.O.:120] Out: 175 [Urine:175]  Physical Exam: General: No acute distress  Head: Normocephalic, atraumatic. Moist oral mucosal membranes  Eyes: Anicteric  Neck: Supple, trachea midline  Lungs:  Clear to auscultation, normal effort  Heart: S1S2 no rubs  Abdomen:  Soft, nontender, bowel sounds present  Extremities: 1+ peripheral edema.  Neurologic: Awake, alert, following commands  Skin: No lesions  Access: LUE AVF    Basic Metabolic Panel: Recent Labs  Lab 11/09/18 0219 11/09/18 0607 11/09/18 0819 11/10/18 0643 11/11/18 0509  NA 137 137  --  140  --   K 7.3* 5.0 3.6 5.1  --   CL 101 101  --  102  --   CO2 18* 19*  --  22  --   GLUCOSE 140* 114*  --  93  --   BUN 96* 65*  --  51*  --   CREATININE 11.89* 7.83*  --  7.63*  --   CALCIUM 8.9 8.6*  --  8.5*  --   PHOS  --  5.4*  5.5*  --   --  4.6    Liver Function Tests: Recent Labs  Lab 11/09/18 0219 11/09/18 0607  AST 8*  --   ALT 11  --   ALKPHOS 87  --   BILITOT 0.5  --   PROT 7.0  --   ALBUMIN 3.9 3.9   No results for input(s): LIPASE, AMYLASE in the last 168 hours. No results for input(s): AMMONIA in the last 168 hours.  CBC: Recent Labs  Lab 11/09/18 0219 11/09/18 0607 11/12/18 0529  WBC 5.6  6.4 3.8*  HGB 10.5* 9.1* 8.9*  HCT 34.3* 29.0* 29.2*  MCV 97.4 95.4 97.7  PLT 138* 134* 103*    Cardiac Enzymes: No results for input(s): CKTOTAL, CKMB, CKMBINDEX, TROPONINI in the last 168 hours.  BNP: Invalid input(s): POCBNP  CBG: Recent Labs  Lab 11/10/18 2102 11/11/18 0733 11/11/18 1142 11/11/18 1651 11/12/18 0746  GLUCAP 140* 110* 123* 122* 93    Microbiology: Results for orders placed or performed during the hospital encounter of 11/09/18  SARS Coronavirus 2 [facility] order, Performed in [facility] hospital lab) Nasopharyngeal Nasopharyngeal Swab     Status: None   Collection Time: 11/09/18  2:19 AM   Specimen: Nasopharyngeal Swab  Result Value Ref Range Status   SARS Coronavirus 2 NEGATIVE NEGATIVE Final    Comment: (NOTE) If result is NEGATIVE SARS-CoV-2 target nucleic acids are NOT DETECTED. The SARS-CoV-2 RNA is generally detectable in upper and lower  respiratory specimens during the acute phase of infection. The lowest  concentration of SARS-CoV-2 viral copies this assay can detect is 250  copies / mL. A negative result does not preclude SARS-CoV-2 infection  and should  not be used as the sole basis for treatment or other  patient management decisions.  A negative result may occur with  improper specimen collection / handling, submission of specimen other  than nasopharyngeal swab, presence of viral mutation(s) within the  areas targeted by this assay, and inadequate number of viral copies  (<250 copies / mL). A negative result must be combined with clinical  observations, patient history, and epidemiological information. If result is POSITIVE SARS-CoV-2 target nucleic acids are DETECTED. The SARS-CoV-2 RNA is generally detectable in upper and lower  respiratory specimens dur ing the acute phase of infection.  Positive  results are indicative of active infection with SARS-CoV-2.  Clinical  correlation with patient history and other diagnostic  information is  necessary to determine patient infection status.  Positive results do  not rule out bacterial infection or co-infection with other viruses. If result is PRESUMPTIVE POSTIVE SARS-CoV-2 nucleic acids MAY BE PRESENT.   A presumptive positive result was obtained on the submitted specimen  and confirmed on repeat testing.  While 2019 novel coronavirus  (SARS-CoV-2) nucleic acids may be present in the submitted sample  additional confirmatory testing may be necessary for epidemiological  and / or clinical management purposes  to differentiate between  SARS-CoV-2 and other Sarbecovirus currently known to infect humans.  If clinically indicated additional testing with an alternate test  methodology [id number]) is advised. The SARS-CoV-2 RNA is generally  detectable in upper and lower respiratory sp ecimens during the acute  phase of infection. The expected result is Negative. Fact Sheet for Patients:  StrictlyIdeas.no Fact Sheet for Healthcare Providers: BankingDealers.co.za This test is not yet approved or cleared by the [place] FDA and has been authorized for detection and/or diagnosis of SARS-CoV-2 by FDA under an Emergency Use Authorization (EUA).  This EUA will remain in effect (meaning this test can be used) for the duration of the COVID-19 declaration under Section 564(b)(1) of the Act, 21 U.S.C. section 360bbb-3(b)(1), unless the authorization is terminated or revoked sooner. Performed at [facility], [address] [place], [place] [address]     Coagulation Studies: Recent Labs    11/10/18 [id number] 11/11/18 0509 11/12/18 0529  LABPROT 17.0* 17.6* 17.4*  INR 1.4* 1.5* 1.4*    Urinalysis: No results for input(s): COLORURINE, LABSPEC, PHURINE, GLUCOSEU, HGBUR, BILIRUBINUR, KETONESUR, PROTEINUR, UROBILINOGEN, NITRITE, LEUKOCYTESUR in the last 72 hours.  Invalid input(s): APPERANCEUR    Imaging: No  results found.   Medications:    . aspirin EC  81 mg Oral Daily  . atorvastatin  80 mg Oral Nightly  . Chlorhexidine Gluconate Cloth  6 each Topical Q0600  . docusate sodium  100 mg Oral BID  . heparin  5,000 Units Subcutaneous Q8H  . insulin aspart  0-5 Units Subcutaneous QHS  . insulin aspart  0-9 Units Subcutaneous TID WC  . midodrine  2.5 mg Oral TID WC  . pregabalin  25 mg Oral QHS  . warfarin  6 mg Oral ONCE-1800  . Warfarin - Pharmacist Dosing Inpatient   Does not apply q1800   acetaminophen **OR** acetaminophen, ondansetron **OR** ondansetron (ZOFRAN) IV, oxyCODONE-acetaminophen, polyvinyl alcohol  Assessment/ Plan:  73 y.o. 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   [facility]/left arm AV fistula/MWF  1.  ESRD on HD MWF.  Patient has missed dialysis for greater than  1 week.   -Patient seen and evaluated at bedside. Patient seen during dialysis treatment.  Tolerating well.  We plan to complete dialysis treatment today.  2.  Severe hyperkalemia.  Most recent potassium was 5.1.  Recheck this today.  3.  Secondary hyperparathyroidism.  Check serum phosphorus today.  4.  Anemia of chronic kidney disease.  Hemoglobin down to 8.9.  Start the patient on Epogen 10,000 units IV today.   LOS: 3 Antonio Phillips 9/7/20209:58 AM

## 2018-11-12 NOTE — Progress Notes (Signed)
Pre HD Assessment    11/12/18 0817  Neurological  Level of Consciousness Alert  Orientation Level Oriented X4  Respiratory  Respiratory Pattern Regular;Unlabored  Chest Assessment Chest expansion symmetrical  Bilateral Breath Sounds Diminished  Cough None  Cardiac  Pulse Regular  Heart Sounds No adventitious heart sounds  Jugular Venous Distention (JVD) No  ECG Monitor Yes  Cardiac Rhythm SB  Vascular  R Radial Pulse +2  L Radial Pulse +2  Integumentary  Integumentary (WDL) WDL  Musculoskeletal  Musculoskeletal (WDL) X  Generalized Weakness Yes  Gastrointestinal  Bowel Sounds Assessment Active  GU Assessment  Genitourinary (WDL) X  Genitourinary Symptoms Oliguria  Psychosocial  Psychosocial (WDL) WDL

## 2018-11-12 NOTE — Progress Notes (Signed)
[facility] [place] NAME: Tome Palley    MR#:  [id number]  DATE OF BIRTH:  [birth year]  SUBJECTIVE:   Patient doing ok in HD this am Understands that we do not have spot for him for HD confirmed willing to stay in hospital until we have confirmation   REVIEW OF SYSTEMS:  Review of Systems  Constitutional: Negative.  Negative for chills, fever and malaise/fatigue.  HENT: Negative.  Negative for ear discharge, ear pain, hearing loss, nosebleeds and sore throat.   Eyes: Negative.  Negative for blurred vision and pain.  Respiratory: Negative.  Negative for cough, hemoptysis, shortness of breath and wheezing.   Cardiovascular: Negative.  Negative for chest pain, palpitations and leg swelling.  Gastrointestinal: Negative.  Negative for abdominal pain, blood in stool, diarrhea, nausea and vomiting.  Genitourinary: Negative.  Negative for dysuria.  Musculoskeletal: Negative.  Negative for back pain.  Skin: Negative.   Neurological: Negative for dizziness, tremors, speech change, focal weakness, seizures and headaches.  Endo/Heme/Allergies: Negative.  Does not bruise/bleed easily.  Psychiatric/Behavioral: Negative.  Negative for depression, hallucinations and suicidal ideas.      Tolerating Diet: yes      DRUG ALLERGIES:  No Known Allergies  VITALS:  Blood pressure (!) 110/53, pulse (!) 56, temperature 98.4 F (36.9 C), temperature source Oral, resp. rate 14, weight 109.5 kg, SpO2 97 %.  PHYSICAL EXAMINATION:  Constitutional: Appears obese No distress. HENT: Normocephalic.   Neck: No tracheal deviation. CVS: RRR, S1/S2 +, no murmurs, no gallops, no carotid bruit.  Pulmonary: Effort and breath sounds normal, no stridor, rhonchi, wheezes, rales.  Abdominal: Soft. BS +,  no distension, tenderness, rebound or guarding.  Musculoskeletal:  No edema and no tenderness.  Neuro: no focal deficits  Skin: Skin is warm and dry. No rash  noted. Psychiatric:  Normal affect    LABORATORY PANEL:   CBC Recent Labs  Lab 11/12/18 0529  WBC 3.8*  HGB 8.9*  HCT 29.2*  PLT 103*   ------------------------------------------------------------------------------------------------------------------  Chemistries  Recent Labs  Lab 11/09/18 0219  11/10/18 0643  NA 137   < > 140  K 7.3*   < > 5.1  CL 101   < > 102  CO2 18*   < > 22  GLUCOSE 140*   < > 93  BUN 96*   < > 51*  CREATININE 11.89*   < > 7.63*  CALCIUM 8.9   < > 8.5*  AST 8*  --   --   ALT 11  --   --   ALKPHOS 87  --   --   BILITOT 0.5  --   --    < > = values in this interval not displayed.   ------------------------------------------------------------------------------------------------------------------  Cardiac Enzymes No results for input(s): TROPONINI in the last 168 hours. ------------------------------------------------------------------------------------------------------------------  RADIOLOGY:  No results found.   ASSESSMENT AND PLAN:   73 year old male with end-stage renal disease on hemodialysis Monday, Wednesday Friday who missed a couple dialysis sessions and presented to the emergency room with generalized weakness and tremors and found to have hyperkalemia.  1.  Severe hyperkalemia from missing dialysis: Potassium has improved after dialysis sessions.   2.  End-stage renal disease on hemodialysis: Patient does not have outpatient dialysis set up until Wednesday.  He will need to stay in the hospital to continue dialysis treatments through Monday.  3.  Diabetes: SSI and add A1c Not on medications at home, diet controlled  4.  PAF: Patient currently in normal sinus rhythm Continue Coumadin as per pharmacy dosing  5. OSA: Continue CPAP at night    CODE STATUS: full  TOTAL TIME TAKING CARE OF THIS PATIENT:  21 minutes.    POSSIBLE D/C tuesday, DEPENDING ON CLINICAL CONDITION.   Bettey Costa M.D on 11/12/2018 at 7:35  AM  Between 7am to 6pm - Pager - [phone] After 6pm go to [email or web address] - password [email or web address] [facility]  Office  [phone]  CC: Primary care physician; Marsh Dolly, MD  Note: This dictation was prepared with Dragon dictation along with smaller phrase technology. Any transcriptional errors that result from this process are unintentional.

## 2018-11-12 NOTE — Progress Notes (Signed)
HD Tx started   11/12/18 0824  Vital Signs  Pulse Rate 75  Resp 19  BP (!) 151/61  Oxygen Therapy  SpO2 99 %  During Hemodialysis Assessment  Blood Flow Rate (mL/min) 400 mL/min  Arterial Pressure (mmHg) -180 mmHg  Venous Pressure (mmHg) 160 mmHg  Transmembrane Pressure (mmHg) 60 mmHg  Ultrafiltration Rate (mL/min) 590 mL/min  Dialysate Flow Rate (mL/min) 800 ml/min  Conductivity: Machine  14  HD Safety Checks Performed Yes  Intra-Hemodialysis Comments Progressing as prescribed (Pt awake, stable )

## 2018-11-12 NOTE — Care Management Important Message (Signed)
Important Message  Patient Details  Name: Antonio Phillips MRN: [id number] Date of Birth: 11-Jan-1946   Medicare Important Message Given:  Yes     Dannette Barbara 11/12/2018, 11:34 AM

## 2018-11-12 NOTE — Consult Note (Signed)
ANTICOAGULATION CONSULT NOTE  Pharmacy Consult for warfarin dose management Indication: atrial fibrillation  No Known Allergies   Vital Signs: Temp: 98.6 F (37 C) (09/07 0454) Temp Source: Oral (09/07 0454) BP: 121/63 (09/07 0454) Pulse Rate: 50 (09/07 0454)  Labs: Recent Labs    11/10/18 0643 11/11/18 0509 11/12/18 0529  HGB  --   --  8.9*  HCT  --   --  29.2*  PLT  --   --  103*  LABPROT 17.0* 17.6* 17.4*  INR 1.4* 1.5* 1.4*  CREATININE 7.63*  --   --     Estimated Creatinine Clearance: 10.7 mL/min (A) (by C-G formula based on SCr of 7.63 mg/dL (H)).   Medical History: Past Medical History:  Diagnosis Date  . Atrial fibrillation ([place])   . Diabetes ([place])   . ESRD (end stage renal disease) ([place])     Medications:  Scheduled:  . aspirin EC  81 mg Oral Daily  . atorvastatin  80 mg Oral Nightly  . Chlorhexidine Gluconate Cloth  6 each Topical Q0600  . docusate sodium  100 mg Oral BID  . heparin  5,000 Units Subcutaneous Q8H  . insulin aspart  0-5 Units Subcutaneous QHS  . insulin aspart  0-9 Units Subcutaneous TID WC  . midodrine  2.5 mg Oral TID WC  . pregabalin  25 mg Oral QHS  . Warfarin - Pharmacist Dosing Inpatient   Does not apply q1800    Assessment: Pharmacy consulted for warfarin dosing and monitoring for 73 yo male with PMH of atrial fibrillation. Patient was taking warfarin PTA. H&H, PLT trending down, continue to follow. Baseline INR subtherapeutic   Pt says he has been homeless for over a year. Unsure if he has been compliant with taking medications and when he took his last warfarin dose.   Home Regimen: Warfarin 2.5mg : Cain Saupe, Sat                             Warfarin 5mg : Mon, Wed, Fri, Sun  Date INR Warfarin Dose  9/4 1.6 6 mg  9/5 1.4 6mg   9/6 1.5 6mg   9/7 1.5 6mg     Goal of Therapy:  INR 2-3 Monitor platelets by anticoagulation protocol: Yes   Plt 9/4-9/7 134-->103    Plan:   Baseline INR subtherapeutic  On SubQ  heparin q8H while subtherapeutic (Currently not in A. Fib)   Warfarin 6 mg dose tonight  INR in am to guide dosing   CBC at least every 3 days per protocol  Lu Duffel, PharmD, BCPS Clinical Pharmacist 11/12/2018 9:15 AM

## 2018-11-13 LAB — CBC
HCT: 33.6 % — ABNORMAL LOW (ref 39.0–52.0)
Hemoglobin: 10.2 g/dL — ABNORMAL LOW (ref 13.0–17.0)
MCH: 29.2 pg (ref 26.0–34.0)
MCHC: 30.4 g/dL (ref 30.0–36.0)
MCV: 96.3 fL (ref 80.0–100.0)
Platelets: 120 10*3/uL — ABNORMAL LOW (ref 150–400)
RBC: 3.49 MIL/uL — ABNORMAL LOW (ref 4.22–5.81)
RDW: 17.2 % — ABNORMAL HIGH (ref 11.5–15.5)
WBC: 4.4 10*3/uL (ref 4.0–10.5)
nRBC: 0 % (ref 0.0–0.2)

## 2018-11-13 LAB — GLUCOSE, CAPILLARY
Glucose-Capillary: 121 mg/dL — ABNORMAL HIGH (ref 70–99)
Glucose-Capillary: 99 mg/dL (ref 70–99)
Glucose-Capillary: 120 mg/dL — ABNORMAL HIGH (ref 70–99)
Glucose-Capillary: 125 mg/dL — ABNORMAL HIGH (ref 70–99)
Glucose-Capillary: 154 mg/dL — ABNORMAL HIGH (ref 70–99)

## 2018-11-13 LAB — PROTIME-INR
INR: 1.4 — ABNORMAL HIGH (ref 0.8–1.2)
Prothrombin Time: 17.2 seconds — ABNORMAL HIGH (ref 11.4–15.2)

## 2018-11-13 MED ORDER — METOPROLOL TARTRATE 25 MG PO TABS
25.0000 mg | ORAL_TABLET | Freq: Two times a day (BID) | ORAL | Status: DC
  Administered 2018-11-13 (×2): 25 mg via ORAL
  Filled 2018-11-13 (×2): qty 1

## 2018-11-13 MED ORDER — WARFARIN SODIUM 10 MG PO TABS
10.0000 mg | ORAL_TABLET | Freq: Once | ORAL | Status: AC
  Administered 2018-11-13: 19:00:00 10 mg via ORAL
  Filled 2018-11-13: qty 1

## 2018-11-13 NOTE — Discharge Summary (Addendum)
[facility] [place] NAME: Antonio Phillips    MR#:  [id number]  DATE OF BIRTH:  [birth year]  DATE OF ADMISSION:  11/09/2018 ADMITTING PHYSICIAN: Harrie Foreman, MD  DATE OF DISCHARGE: 11/14/2018  PRIMARY CARE PHYSICIAN: Marsh Dolly, MD    ADMISSION DIAGNOSIS:  Hyperkalemia [E87.5]  DISCHARGE DIAGNOSIS:  Active Problems:   Hyperkalemia   SECONDARY DIAGNOSIS:   Past Medical History:  Diagnosis Date  . Atrial fibrillation ([place])   . Diabetes ([place])   . ESRD (end stage renal disease) [facility])     HOSPITAL COURSE:   73 year old male with end-stage renal disease on hemodialysis Monday, Wednesday Friday who missed a couple dialysis sessions and presented to the emergency room with generalized weakness and tremors and found to have hyperkalemia.  1.  Severe hyperkalemia from missing dialysis: Potassium has improved after dialysis sessions.   2.  End-stage renal disease on hemodialysis: CM assited with discharge plan for outpatient HD set up. Patient accepted at [facility] MWF 4pm.   3.  Diabetes:  Not on medications at home, diet controlled    4.  PAF: Patient currently in normal sinus rhythm Continue Coumadin with INR check by his PCP.  5. OSA: Continue CPAP at night    DISCHARGE CONDITIONS AND DIET:   Stable Renal diabetic diet  CONSULTS OBTAINED:    DRUG ALLERGIES:  No Known Allergies  DISCHARGE MEDICATIONS:   Allergies as of 11/13/2018   No Known Allergies     Medication List    STOP taking these medications   HYDROcodone-acetaminophen 5-325 MG tablet Commonly known as: NORCO/VICODIN     TAKE these medications   acetaminophen 500 MG tablet Commonly known as: TYLENOL Take 1,000 mg by mouth every 8 (eight) hours as needed.   aspirin EC 81 MG tablet Take 81 mg by mouth daily.   atorvastatin 80 MG tablet Commonly known as: LIPITOR Take 80 mg by mouth Nightly.   calcitRIOL 0.25 MCG  capsule Commonly known as: ROCALTROL Take 0.25 mcg by mouth daily.   furosemide 20 MG tablet Commonly known as: LASIX Take 10-20 mg by mouth daily. Non-dialysis days   metoprolol tartrate 25 MG tablet Commonly known as: LOPRESSOR Take 25 mg by mouth 2 (two) times daily.   midodrine 2.5 MG tablet Commonly known as: PROAMATINE Take 2.5 mg by mouth 3 (three) times daily with meals.   oxyCODONE-acetaminophen 5-325 MG tablet Commonly known as: Percocet Take 1 tablet by mouth every 6 (six) hours as needed for severe pain.   pregabalin 25 MG capsule Commonly known as: LYRICA Take 25 mg by mouth Nightly.   sevelamer carbonate 800 MG tablet Commonly known as: RENVELA Take 800 mg by mouth 3 (three) times daily with meals.   warfarin 2.5 MG tablet Commonly known as: COUMADIN Take 2.5 mg by mouth daily. Take 1 tablet by mouth sat/t/th, take 2 tablets m/w/f/sun         Today   CHIEF COMPLAINT:  No issues overnight   VITAL SIGNS:  Blood pressure 104/66, pulse (!) 143, temperature 98 F (36.7 C), temperature source Oral, resp. rate 20, weight 105.5 kg, SpO2 97 %.   REVIEW OF SYSTEMS:  Review of Systems  Constitutional: Negative.  Negative for chills, fever and malaise/fatigue.  HENT: Negative.  Negative for ear discharge, ear pain, hearing loss, nosebleeds and sore throat.   Eyes: Negative.  Negative for blurred vision and pain.  Respiratory: Negative.  Negative for cough,  hemoptysis, shortness of breath and wheezing.   Cardiovascular: Negative.  Negative for chest pain, palpitations and leg swelling.  Gastrointestinal: Negative.  Negative for abdominal pain, blood in stool, diarrhea, nausea and vomiting.  Genitourinary: Negative.  Negative for dysuria.  Musculoskeletal: Negative.  Negative for back pain.  Skin: Negative.   Neurological: Negative for dizziness, tremors, speech change, focal weakness, seizures and headaches.  Endo/Heme/Allergies: Negative.  Does not  bruise/bleed easily.  Psychiatric/Behavioral: Negative.  Negative for depression, hallucinations and suicidal ideas.     PHYSICAL EXAMINATION:  GENERAL:  73 y.o.-year-old patient lying in the bed with no acute distress.  NECK:  Supple, no jugular venous distention. No thyroid enlargement, no tenderness.  LUNGS: Normal breath sounds bilaterally, no wheezing, rales,rhonchi  No use of accessory muscles of respiration.  CARDIOVASCULAR: S1, S2 normal. No murmurs, rubs, or gallops.  ABDOMEN: Soft, non-tender, non-distended. Bowel sounds present. No organomegaly or mass.  EXTREMITIES: No pedal edema, cyanosis, or clubbing.  B/l BKA PSYCHIATRIC: The patient is alert and oriented x 3.  SKIN: No obvious rash, lesion, or ulcer.   DATA REVIEW:   CBC Recent Labs  Lab 11/13/18 0407  WBC 4.4  HGB 10.2*  HCT 33.6*  PLT 120*    Chemistries  Recent Labs  Lab 11/09/18 0219  11/12/18 1018  NA 137   < > 139  K 7.3*   < > 4.0  CL 101   < > 99  CO2 18*   < > 23  GLUCOSE 140*   < > 85  BUN 96*   < > 36*  CREATININE 11.89*   < > 6.89*  CALCIUM 8.9   < > 8.7*  AST 8*  --   --   ALT 11  --   --   ALKPHOS 87  --   --   BILITOT 0.5  --   --    < > = values in this interval not displayed.    Cardiac Enzymes No results for input(s): TROPONINI in the last 168 hours.  Microbiology Results  @[email or web address] @  RADIOLOGY:  No results found.    Allergies as of 11/13/2018   No Known Allergies     Medication List    STOP taking these medications   HYDROcodone-acetaminophen 5-325 MG tablet Commonly known as: NORCO/VICODIN     TAKE these medications   acetaminophen 500 MG tablet Commonly known as: TYLENOL Take 1,000 mg by mouth every 8 (eight) hours as needed.   aspirin EC 81 MG tablet Take 81 mg by mouth daily.   atorvastatin 80 MG tablet Commonly known as: LIPITOR Take 80 mg by mouth Nightly.   calcitRIOL 0.25 MCG capsule Commonly known as: ROCALTROL Take 0.25 mcg by mouth  daily.   furosemide 20 MG tablet Commonly known as: LASIX Take 10-20 mg by mouth daily. Non-dialysis days   metoprolol tartrate 25 MG tablet Commonly known as: LOPRESSOR Take 25 mg by mouth 2 (two) times daily.   midodrine 2.5 MG tablet Commonly known as: PROAMATINE Take 2.5 mg by mouth 3 (three) times daily with meals.   oxyCODONE-acetaminophen 5-325 MG tablet Commonly known as: Percocet Take 1 tablet by mouth every 6 (six) hours as needed for severe pain.   pregabalin 25 MG capsule Commonly known as: LYRICA Take 25 mg by mouth Nightly.   sevelamer carbonate 800 MG tablet Commonly known as: RENVELA Take 800 mg by mouth 3 (three) times daily with meals.   warfarin 2.5 MG tablet Commonly known  as: COUMADIN Take 2.5 mg by mouth daily. Take 1 tablet by mouth sat/t/th, take 2 tablets m/w/f/sun          Management plans discussed with the patient and he is in agreement. Stable for discharge   Patient should follow up with pcp  CODE STATUS:     Code Status Orders  (From admission, onward)         Start     Ordered   11/09/18 0917  Full code  Continuous     11/09/18 0916        Code Status History    Date Active Date Inactive Code Status Order ID Comments User Context   04/25/2018 [id number] 04/26/2018 0056 Full Code [id number]  Lance Coon, MD ED   Advance Care Planning Activity      TOTAL TIME TAKING CARE OF THIS PATIENT: 38 minutes.    Note: This dictation was prepared with Dragon dictation along with smaller phrase technology. Any transcriptional errors that result from this process are unintentional.  Bettey Costa M.D on 11/13/2018 at 10:45 AM  Between 7am to 6pm - Pager - [phone] After 6pm go to [email or web address] - password [email or web address]  Office  [phone]  CC: Primary care physician; Marsh Dolly, MD

## 2018-11-13 NOTE — Progress Notes (Signed)
Patient accepted at [facility] MWF 4pm. Patient stated that he does not have a ride and will need transportation set up right away. DC asked him about his "buddy" who he stated earlier would be able to transport, he stated that he would not be able to due to work. Patient stated that transportation can be set up through the [place] through Ms. White (ext. [phone]) Annye Rusk informed. Patient can start tomorrow at 3:30. Patient is aware.

## 2018-11-13 NOTE — Progress Notes (Signed)
Referral still in process, waiting on [place] approval.

## 2018-11-13 NOTE — Progress Notes (Signed)
[facility]  ROUNDING NOTE   Subjective:   Hemodialysis treatment yesterday. Tolerated treatment well. Plan on discharge to home today.   Objective:  Vital signs in last 24 hours:  Temp:  [98 F (36.7 C)-98.5 F (36.9 C)] 98.4 F (36.9 C) (09/08 1343) Pulse Rate:  [60-143] 140 (09/08 1343) Resp:  [16-20] 20 (09/08 0537) BP: (87-141)/(56-74) 141/62 (09/08 1343) SpO2:  [95 %-98 %] 98 % (09/08 1228) Weight:  [105.5 kg] 105.5 kg (09/08 0600)  Weight change:  Filed Weights   11/12/18 0812 11/12/18 0822 11/13/18 0600  Weight: 109.5 kg 109.5 kg 105.5 kg    Intake/Output: I/O last 3 completed shifts: In: 720 [P.O.:720] Out: 2847 [Urine:1325; Other:1522]   Intake/Output this shift:  Total I/O In: 240 [P.O.:240] Out: 200 [Urine:200]  Physical Exam: General: No acute distress, sitting up in bed  Head: Normocephalic, atraumatic. Moist oral mucosal membranes  Eyes: Anicteric  Neck: Supple, trachea midline  Lungs:  Clear to auscultation, normal effort  Heart: regular  Abdomen:  Soft, nontender, bowel sounds present  Extremities: 1+ peripheral edema. Bilateral BKA amputations.   Neurologic: Awake, alert, following commands  Skin: No lesions  Access: LUE AVF    Basic Metabolic Panel: Recent Labs  Lab 11/09/18 0219 11/09/18 0607 11/09/18 0819 11/10/18 0643 11/11/18 0509 11/12/18 1018  NA 137 137  --  140  --  139  K 7.3* 5.0 3.6 5.1  --  4.0  CL 101 101  --  102  --  99  CO2 18* 19*  --  22  --  23  GLUCOSE 140* 114*  --  93  --  85  BUN 96* 65*  --  51*  --  36*  CREATININE 11.89* 7.83*  --  7.63*  --  6.89*  CALCIUM 8.9 8.6*  --  8.5*  --  8.7*  PHOS  --  5.4*  5.5*  --   --  4.6  --     Liver Function Tests: Recent Labs  Lab 11/09/18 0219 11/09/18 0607  AST 8*  --   ALT 11  --   ALKPHOS 87  --   BILITOT 0.5  --   PROT 7.0  --   ALBUMIN 3.9 3.9   No results for input(s): LIPASE, AMYLASE in the last 168 hours. No results for input(s):  AMMONIA in the last 168 hours.  CBC: Recent Labs  Lab 11/09/18 0219 11/09/18 0607 11/12/18 0529 11/13/18 0407  WBC 5.6 6.4 3.8* 4.4  HGB 10.5* 9.1* 8.9* 10.2*  HCT 34.3* 29.0* 29.2* 33.6*  MCV 97.4 95.4 97.7 96.3  PLT 138* 134* 103* 120*    Cardiac Enzymes: No results for input(s): CKTOTAL, CKMB, CKMBINDEX, TROPONINI in the last 168 hours.  BNP: Invalid input(s): POCBNP  CBG: Recent Labs  Lab 11/12/18 1335 11/12/18 1643 11/12/18 2140 11/13/18 0733 11/13/18 1131  GLUCAP 101* 141* 124* 99 120*    Microbiology: Results for orders placed or performed during the hospital encounter of 11/09/18  SARS Coronavirus 2 [facility] order, Performed in [facility] hospital lab) Nasopharyngeal Nasopharyngeal Swab     Status: None   Collection Time: 11/09/18  2:19 AM   Specimen: Nasopharyngeal Swab  Result Value Ref Range Status   SARS Coronavirus 2 NEGATIVE NEGATIVE Final    Comment: (NOTE) If result is NEGATIVE SARS-CoV-2 target nucleic acids are NOT DETECTED. The SARS-CoV-2 RNA is generally detectable in upper and lower  respiratory specimens during the acute phase of infection.  The lowest  concentration of SARS-CoV-2 viral copies this assay can detect is 250  copies / mL. A negative result does not preclude SARS-CoV-2 infection  and should not be used as the sole basis for treatment or other  patient management decisions.  A negative result may occur with  improper specimen collection / handling, submission of specimen other  than nasopharyngeal swab, presence of viral mutation(s) within the  areas targeted by this assay, and inadequate number of viral copies  (<250 copies / mL). A negative result must be combined with clinical  observations, patient history, and epidemiological information. If result is POSITIVE SARS-CoV-2 target nucleic acids are DETECTED. The SARS-CoV-2 RNA is generally detectable in upper and lower  respiratory specimens dur ing the acute phase of  infection.  Positive  results are indicative of active infection with SARS-CoV-2.  Clinical  correlation with patient history and other diagnostic information is  necessary to determine patient infection status.  Positive results do  not rule out bacterial infection or co-infection with other viruses. If result is PRESUMPTIVE POSTIVE SARS-CoV-2 nucleic acids MAY BE PRESENT.   A presumptive positive result was obtained on the submitted specimen  and confirmed on repeat testing.  While 2019 novel coronavirus  (SARS-CoV-2) nucleic acids may be present in the submitted sample  additional confirmatory testing may be necessary for epidemiological  and / or clinical management purposes  to differentiate between  SARS-CoV-2 and other Sarbecovirus currently known to infect humans.  If clinically indicated additional testing with an alternate test  methodology [phone]) is advised. The SARS-CoV-2 RNA is generally  detectable in upper and lower respiratory sp ecimens during the acute  phase of infection. The expected result is Negative. Fact Sheet for Patients:  StrictlyIdeas.no Fact Sheet for Healthcare Providers: BankingDealers.co.za This test is not yet approved or cleared by the [place] FDA and has been authorized for detection and/or diagnosis of SARS-CoV-2 by FDA under an Emergency Use Authorization (EUA).  This EUA will remain in effect (meaning this test can be used) for the duration of the COVID-19 declaration under Section 564(b)(1) of the Act, 21 U.S.C. section 360bbb-3(b)(1), unless the authorization is terminated or revoked sooner. Performed at [facility], [address] [place], [place] [address]     Coagulation Studies: Recent Labs    11/11/18 0509 11/12/18 0529 11/13/18 0407  LABPROT 17.6* 17.4* 17.2*  INR 1.5* 1.4* 1.4*    Urinalysis: No results for input(s): COLORURINE, LABSPEC, PHURINE, GLUCOSEU,  HGBUR, BILIRUBINUR, KETONESUR, PROTEINUR, UROBILINOGEN, NITRITE, LEUKOCYTESUR in the last 72 hours.  Invalid input(s): APPERANCEUR    Imaging: No results found.   Medications:    . aspirin EC  81 mg Oral Daily  . atorvastatin  80 mg Oral Nightly  . Chlorhexidine Gluconate Cloth  6 each Topical Q0600  . docusate sodium  100 mg Oral BID  . epoetin (EPOGEN/PROCRIT) injection  10,000 Units Intravenous Q T,Th,Sa-HD  . heparin  5,000 Units Subcutaneous Q8H  . insulin aspart  0-5 Units Subcutaneous QHS  . insulin aspart  0-9 Units Subcutaneous TID WC  . metoprolol tartrate  25 mg Oral BID  . midodrine  2.5 mg Oral TID WC  . pregabalin  25 mg Oral QHS  . warfarin  10 mg Oral ONCE-1800  . Warfarin - Pharmacist Dosing Inpatient   Does not apply q1800   acetaminophen **OR** acetaminophen, ondansetron **OR** ondansetron (ZOFRAN) IV, oxyCODONE-acetaminophen, polyvinyl alcohol  Assessment/ Plan:  Antonio Phillips is a  73 y.o. 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   [facility]/left arm AV fistula/MWF  1.  ESRD on HD MWF.   - Hemodialysis treatment for tomorrow - Outpatient planning for [place]. MWF third shift.   2. Hypotension: 141/62 blood pressure currently at goal - midodrine   3.  Secondary hyperparathyroidism: holding binders    4.  Anemia of chronic kidney disease.  - EPO with HD treatment.    LOS: [place] 9/8/20202:58 PM

## 2018-11-13 NOTE — TOC Progression Note (Addendum)
Transition of Care [facility]) - Progression Note    Patient Details  Name: Antonio Phillips MRN: [id number] Date of Birth: Sep 25, 1945  Transition of Care [facility]) CM/SW [place], LCSW Phone Number: 11/13/2018, 2:22 PM  Clinical Narrative: Left voicemail for [place] transportation coordinator.    4:17 pm: No call back from [place] yet. Tried calling again but it went to voicemail. Per RN, patient will not have transportation tomorrow if the [place] cannot take him. Sent message to MD to notify. Printed off EMS paperwork in case patient does end up discharging today. RN confirmed address with patient.  Expected Discharge Plan: Home/Self Care Barriers to Discharge: Continued Medical Work up  Expected Discharge Plan and Services Expected Discharge Plan: Home/Self Care       Living arrangements for the past 2 months: Hotel/Motel                                       Social Determinants of Health (SDOH) Interventions    Readmission Risk Interventions Readmission Risk Prevention Plan 11/09/2018  Transportation Screening Complete  Medication Review Press photographer) Complete  Some recent data might be hidden

## 2018-11-13 NOTE — Progress Notes (Signed)
[facility] [place] NAME: Antonio Phillips    MR#:  [id number]  DATE OF BIRTH:  08/04/1945  SUBJECTIVE:   Awaiting outpatient HD set up ready to go home   REVIEW OF SYSTEMS:  Review of Systems  Constitutional: Negative.  Negative for chills, fever and malaise/fatigue.  HENT: Negative.  Negative for ear discharge, ear pain, hearing loss, nosebleeds and sore throat.   Eyes: Negative.  Negative for blurred vision and pain.  Respiratory: Negative.  Negative for cough, hemoptysis, shortness of breath and wheezing.   Cardiovascular: Negative.  Negative for chest pain, palpitations and leg swelling.  Gastrointestinal: Negative.  Negative for abdominal pain, blood in stool, diarrhea, nausea and vomiting.  Genitourinary: Negative.  Negative for dysuria.  Musculoskeletal: Negative.  Negative for back pain.  Skin: Negative.   Neurological: Negative for dizziness, tremors, speech change, focal weakness, seizures and headaches.  Endo/Heme/Allergies: Negative.  Does not bruise/bleed easily.  Psychiatric/Behavioral: Negative.  Negative for depression, hallucinations and suicidal ideas.      Tolerating Diet: yes      DRUG ALLERGIES:  No Known Allergies  VITALS:  Blood pressure 104/66, pulse (!) 143, temperature 98 F (36.7 C), temperature source Oral, resp. rate 20, weight 105.5 kg, SpO2 97 %.  PHYSICAL EXAMINATION:  Constitutional: Appears obese No distress. HENT: Normocephalic.   Neck: No tracheal deviation. CVS: RRR, S1/S2 +, no murmurs, no gallops, no carotid bruit.  Pulmonary: Effort and breath sounds normal, no stridor, rhonchi, wheezes, rales.  Abdominal: Soft. BS +,  no distension, tenderness, rebound or guarding.  Musculoskeletal:  No edema and no tenderness.  B.l BKA Neuro: no focal deficits  Skin: Skin is warm and dry. No rash noted. Psychiatric:  Normal affect    LABORATORY PANEL:   CBC Recent Labs  Lab 11/13/18 0407   WBC 4.4  HGB 10.2*  HCT 33.6*  PLT 120*   ------------------------------------------------------------------------------------------------------------------  Chemistries  Recent Labs  Lab 11/09/18 0219  11/12/18 1018  NA 137   < > 139  K 7.3*   < > 4.0  CL 101   < > 99  CO2 18*   < > 23  GLUCOSE 140*   < > 85  BUN 96*   < > 36*  CREATININE 11.89*   < > 6.89*  CALCIUM 8.9   < > 8.7*  AST 8*  --   --   ALT 11  --   --   ALKPHOS 87  --   --   BILITOT 0.5  --   --    < > = values in this interval not displayed.   ------------------------------------------------------------------------------------------------------------------  Cardiac Enzymes No results for input(s): TROPONINI in the last 168 hours. ------------------------------------------------------------------------------------------------------------------  RADIOLOGY:  No results found.   ASSESSMENT AND PLAN:   73 year old male with end-stage renal disease on hemodialysis Monday, Wednesday Friday who missed a couple dialysis sessions and presented to the emergency room with generalized weakness and tremors and found to have hyperkalemia.  1.  Severe hyperkalemia from missing dialysis: Potassium has improved after dialysis sessions.   2.  End-stage renal disease on hemodialysis:   He will need to stay in the hospital to continue dialysis until we have confirmed spot for outpatient HD.   3.  Diabetes: SSI Not on medications at home, diet controlled    4.  PAF: Patient currently in normal sinus rhythm Continue Coumadin as per pharmacy dosing  5. OSA: Continue CPAP at  night    CODE STATUS: full  TOTAL TIME TAKING CARE OF THIS PATIENT:  21 minutes.    POSSIBLE D/C today DEPENDING ON HD outpatient spot  Bettey Costa M.D on 11/13/2018 at 7:35 AM  Between 7am to 6pm - Pager - [phone] After 6pm go to [email or web address] - password [email or web address] [facility]  Office  [phone]  CC: Primary  care physician; Marsh Dolly, MD  Note: This dictation was prepared with Dragon dictation along with smaller phrase technology. Any transcriptional errors that result from this process are unintentional.

## 2018-11-13 NOTE — Consult Note (Signed)
ANTICOAGULATION CONSULT NOTE  Pharmacy Consult for warfarin dose management Indication: atrial fibrillation  No Known Allergies   Vital Signs: Temp: 98 F (36.7 C) (09/08 0537) Temp Source: Oral (09/08 0537) BP: 104/66 (09/08 0823) Pulse Rate: 143 (09/08 0823)  Labs: Recent Labs    11/11/18 0509 11/12/18 0529 11/12/18 1018 11/13/18 0407  HGB  --  8.9*  --  10.2*  HCT  --  29.2*  --  33.6*  PLT  --  103*  --  120*  LABPROT 17.6* 17.4*  --  17.2*  INR 1.5* 1.4*  --  1.4*  CREATININE  --   --  6.89*  --     Estimated Creatinine Clearance: 11.6 mL/min (A) (by C-G formula based on SCr of 6.89 mg/dL (H)).   Medical History: Past Medical History:  Diagnosis Date  . Atrial fibrillation ([place])   . Diabetes ([place])   . ESRD (end stage renal disease) ([place])     Medications:  Scheduled:  . aspirin EC  81 mg Oral Daily  . atorvastatin  80 mg Oral Nightly  . Chlorhexidine Gluconate Cloth  6 each Topical Q0600  . docusate sodium  100 mg Oral BID  . epoetin (EPOGEN/PROCRIT) injection  10,000 Units Intravenous Q T,Th,Sa-HD  . heparin  5,000 Units Subcutaneous Q8H  . insulin aspart  0-5 Units Subcutaneous QHS  . insulin aspart  0-9 Units Subcutaneous TID WC  . midodrine  2.5 mg Oral TID WC  . pregabalin  25 mg Oral QHS  . Warfarin - Pharmacist Dosing Inpatient   Does not apply q1800    Assessment: Pharmacy consulted for warfarin dosing and monitoring for 73 yo male with PMH of atrial fibrillation. Patient was taking warfarin PTA. H&H, PLT trending down, seems to have stabilized, continue to follow. Baseline INR subtherapeutic   Pt says he has been homeless for over a year. Unsure if he has been compliant with taking medications and when he took his last warfarin dose.   Home Regimen: Warfarin 2.5mg : [place], Sat                             Warfarin 5mg : Mon, Wed, Fri, Sun  Date INR Warfarin Dose  9/4 1.6 6 mg  9/5 1.4 6mg   9/6 1.5 6mg   9/7 1.4 6mg   9/8 1.4 10 mg     Goal of Therapy:  INR 2-3 Monitor platelets by anticoagulation protocol: Yes     Plan:   INR remains subtherapeutic  On SubQ heparin q8H while subtherapeutic (Currently not in A. Fib)   Warfarin 10 mg dose tonight  INR in am to guide dosing   CBC at least every 3 days per protocol  Dallie Piles, PharmD Clinical Pharmacist 11/13/2018 10:18 AM

## 2018-11-14 LAB — RENAL FUNCTION PANEL
Albumin: 3.4 g/dL — ABNORMAL LOW (ref 3.5–5.0)
Anion gap: 15 (ref 5–15)
BUN: 37 mg/dL — ABNORMAL HIGH (ref 8–23)
CO2: 24 mmol/L (ref 22–32)
Calcium: 8.9 mg/dL (ref 8.9–10.3)
Chloride: 99 mmol/L (ref 98–111)
Creatinine, Ser: 6.4 mg/dL — ABNORMAL HIGH (ref 0.61–1.24)
GFR calc Af Amer: 9 mL/min — ABNORMAL LOW (ref >60)
GFR calc non Af Amer: 8 mL/min — ABNORMAL LOW (ref >60)
Glucose, Bld: 110 mg/dL — ABNORMAL HIGH (ref 70–99)
Phosphorus: 5.6 mg/dL — ABNORMAL HIGH (ref 2.5–4.6)
Potassium: 4.1 mmol/L (ref 3.5–5.1)
Sodium: 138 mmol/L (ref 135–145)

## 2018-11-14 LAB — PROTIME-INR
INR: 1.8 — ABNORMAL HIGH (ref 0.8–1.2)
Prothrombin Time: 20.9 seconds — ABNORMAL HIGH (ref 11.4–15.2)

## 2018-11-14 LAB — CBC
HCT: 34.5 % — ABNORMAL LOW (ref 39.0–52.0)
Hemoglobin: 10.4 g/dL — ABNORMAL LOW (ref 13.0–17.0)
MCH: 29.4 pg (ref 26.0–34.0)
MCHC: 30.1 g/dL (ref 30.0–36.0)
MCV: 97.5 fL (ref 80.0–100.0)
Platelets: 128 10*3/uL — ABNORMAL LOW (ref 150–400)
RBC: 3.54 MIL/uL — ABNORMAL LOW (ref 4.22–5.81)
RDW: 17.3 % — ABNORMAL HIGH (ref 11.5–15.5)
WBC: 5.4 10*3/uL (ref 4.0–10.5)
nRBC: 0 % (ref 0.0–0.2)

## 2018-11-14 LAB — HEPATITIS B CORE ANTIBODY, IGM: Hep B C IgM: NEGATIVE

## 2018-11-14 LAB — GLUCOSE, CAPILLARY: Glucose-Capillary: 99 mg/dL (ref 70–99)

## 2018-11-14 LAB — HEPATITIS B SURFACE ANTIBODY, QUANTITATIVE: Hep B S AB Quant (Post): 4.4 m[IU]/mL — ABNORMAL LOW (ref >9.9)

## 2018-11-14 NOTE — Progress Notes (Signed)
[facility]  ROUNDING NOTE   Subjective:   Seen and examined on hemodialysis. Patient is tolerating treatment well.   Patient was originally scheduled for discharge yesterday however transportation was unable to be arranged.     HEMODIALYSIS FLOWSHEET:  Blood Flow Rate (mL/min): 400 mL/min Arterial Pressure (mmHg): -170 mmHg Venous Pressure (mmHg): 130 mmHg Transmembrane Pressure (mmHg): 60 mmHg Ultrafiltration Rate (mL/min): 500 mL/min Dialysate Flow Rate (mL/min): 600 ml/min Conductivity: Machine : 13.9 Conductivity: Machine : 13.9 Dialysis Fluid Bolus: Normal Saline Bolus Amount (mL): 250 mL Dialysate Change: 2K    Objective:  Vital signs in last 24 hours:  Temp:  [98.1 F (36.7 C)-98.4 F (36.9 C)] 98.4 F (36.9 C) (09/09 0929) Pulse Rate:  [27-140] 54 (09/09 1200) Resp:  [11-20] 16 (09/09 1200) BP: (101-141)/(47-97) 111/59 (09/09 1200) SpO2:  [93 %-100 %] 97 % (09/09 1200) Weight:  [105.9 kg] 105.9 kg (09/09 0929)  Weight change:  Filed Weights   11/12/18 0822 11/13/18 0600 11/14/18 0929  Weight: 109.5 kg 105.5 kg 105.9 kg    Intake/Output: I/O last 3 completed shifts: In: 960 [P.O.:960] Out: 1000 [Urine:1000]   Intake/Output this shift:  No intake/output data recorded.  Physical Exam: General: No acute distress, sitting up in bed  Head: Normocephalic, atraumatic. Moist oral mucosal membranes  Eyes: Anicteric  Neck: Supple, trachea midline  Lungs:  Clear to auscultation, normal effort  Heart: regular  Abdomen:  Soft, nontender, bowel sounds present  Extremities: 1+ peripheral edema. Bilateral BKA amputations.   Neurologic: Awake, alert, following commands  Skin: No lesions  Access: LUE AVF    Basic Metabolic Panel: Recent Labs  Lab 11/09/18 0219 11/09/18 0607 11/09/18 0819 11/10/18 0643 11/11/18 0509 11/12/18 1018 11/14/18 0558  NA 137 137  --  140  --  139 138  K 7.3* 5.0 3.6 5.1  --  4.0 4.1  CL 101 101  --  102  --  99  99  CO2 18* 19*  --  22  --  23 24  GLUCOSE 140* 114*  --  93  --  85 110*  BUN 96* 65*  --  51*  --  36* 37*  CREATININE 11.89* 7.83*  --  7.63*  --  6.89* 6.40*  CALCIUM 8.9 8.6*  --  8.5*  --  8.7* 8.9  PHOS  --  5.4*  5.5*  --   --  4.6  --  5.6*    Liver Function Tests: Recent Labs  Lab 11/09/18 0219 11/09/18 0607 11/14/18 0558  AST 8*  --   --   ALT 11  --   --   ALKPHOS 87  --   --   BILITOT 0.5  --   --   PROT 7.0  --   --   ALBUMIN 3.9 3.9 3.4*   No results for input(s): LIPASE, AMYLASE in the last 168 hours. No results for input(s): AMMONIA in the last 168 hours.  CBC: Recent Labs  Lab 11/09/18 0219 11/09/18 0607 11/12/18 0529 11/13/18 0407 11/14/18 0558  WBC 5.6 6.4 3.8* 4.4 5.4  HGB 10.5* 9.1* 8.9* 10.2* 10.4*  HCT 34.3* 29.0* 29.2* 33.6* 34.5*  MCV 97.4 95.4 97.7 96.3 97.5  PLT 138* 134* 103* 120* 128*    Cardiac Enzymes: No results for input(s): CKTOTAL, CKMB, CKMBINDEX, TROPONINI in the last 168 hours.  BNP: Invalid input(s): POCBNP  CBG: Recent Labs  Lab 11/13/18 0733 11/13/18 1131 11/13/18 1644 11/13/18 2045 11/14/18 0745  GLUCAP 99 120* 125* 154* 99    Microbiology: Results for orders placed or performed during the hospital encounter of 11/09/18  SARS Coronavirus 2 [facility] order, Performed in [facility] hospital lab) Nasopharyngeal Nasopharyngeal Swab     Status: None   Collection Time: 11/09/18  2:19 AM   Specimen: Nasopharyngeal Swab  Result Value Ref Range Status   SARS Coronavirus 2 NEGATIVE NEGATIVE Final    Comment: (NOTE) If result is NEGATIVE SARS-CoV-2 target nucleic acids are NOT DETECTED. The SARS-CoV-2 RNA is generally detectable in upper and lower  respiratory specimens during the acute phase of infection. The lowest  concentration of SARS-CoV-2 viral copies this assay can detect is 250  copies / mL. A negative result does not preclude SARS-CoV-2 infection  and should not be used as the sole basis for treatment  or other  patient management decisions.  A negative result may occur with  improper specimen collection / handling, submission of specimen other  than nasopharyngeal swab, presence of viral mutation(s) within the  areas targeted by this assay, and inadequate number of viral copies  (<250 copies / mL). A negative result must be combined with clinical  observations, patient history, and epidemiological information. If result is POSITIVE SARS-CoV-2 target nucleic acids are DETECTED. The SARS-CoV-2 RNA is generally detectable in upper and lower  respiratory specimens dur ing the acute phase of infection.  Positive  results are indicative of active infection with SARS-CoV-2.  Clinical  correlation with patient history and other diagnostic information is  necessary to determine patient infection status.  Positive results do  not rule out bacterial infection or co-infection with other viruses. If result is PRESUMPTIVE POSTIVE SARS-CoV-2 nucleic acids MAY BE PRESENT.   A presumptive positive result was obtained on the submitted specimen  and confirmed on repeat testing.  While 2019 novel coronavirus  (SARS-CoV-2) nucleic acids may be present in the submitted sample  additional confirmatory testing may be necessary for epidemiological  and / or clinical management purposes  to differentiate between  SARS-CoV-2 and other Sarbecovirus currently known to infect humans.  If clinically indicated additional testing with an alternate test  methodology [id number]) is advised. The SARS-CoV-2 RNA is generally  detectable in upper and lower respiratory sp ecimens during the acute  phase of infection. The expected result is Negative. Fact Sheet for Patients:  StrictlyIdeas.no Fact Sheet for Healthcare Providers: BankingDealers.co.za This test is not yet approved or cleared by the [place] FDA and has been authorized for detection and/or diagnosis of  SARS-CoV-2 by FDA under an Emergency Use Authorization (EUA).  This EUA will remain in effect (meaning this test can be used) for the duration of the COVID-19 declaration under Section 564(b)(1) of the Act, 21 U.S.C. section 360bbb-3(b)(1), unless the authorization is terminated or revoked sooner. Performed at [facility], [address] [place], [place] [address]     Coagulation Studies: Recent Labs    11/12/18 0529 11/13/18 0407 11/14/18 0558  LABPROT 17.4* 17.2* 20.9*  INR 1.4* 1.4* 1.8*    Urinalysis: No results for input(s): COLORURINE, LABSPEC, PHURINE, GLUCOSEU, HGBUR, BILIRUBINUR, KETONESUR, PROTEINUR, UROBILINOGEN, NITRITE, LEUKOCYTESUR in the last 72 hours.  Invalid input(s): APPERANCEUR    Imaging: No results found.   Medications:    . aspirin EC  81 mg Oral Daily  . atorvastatin  80 mg Oral Nightly  . Chlorhexidine Gluconate Cloth  6 each Topical Q0600  . docusate sodium  100 mg Oral BID  . epoetin (  EPOGEN/PROCRIT) injection  10,000 Units Intravenous Q T,Th,Sa-HD  . heparin  5,000 Units Subcutaneous Q8H  . insulin aspart  0-5 Units Subcutaneous QHS  . insulin aspart  0-9 Units Subcutaneous TID WC  . metoprolol tartrate  25 mg Oral BID  . midodrine  2.5 mg Oral TID WC  . pregabalin  25 mg Oral QHS  . Warfarin - Pharmacist Dosing Inpatient   Does not apply q1800   acetaminophen **OR** acetaminophen, ondansetron **OR** ondansetron (ZOFRAN) IV, oxyCODONE-acetaminophen, polyvinyl alcohol  Assessment/ Plan:  [name] is a 73 y.o. 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   [facility]/left arm AV fistula/MWF  1.  ESRD on HD MWF.   - Seen and examined on hemodialysis treatment. Tolerating treatment well.  - Outpatient  planning for [place] MWF third shift.   2. Hypotension: 111/59 blood pressure currently at goal - midodrine   3.  Secondary hyperparathyroidism: holding binders    4.  Anemia of chronic kidney disease.  - EPO with HD treatment.    LOS: 5 Antonio Phillips 9/9/202012:57 PM

## 2018-11-14 NOTE — Progress Notes (Signed)
Post HD Assesment    11/14/18 1235  Neurological  Orientation Level Oriented X4  Respiratory  Respiratory Pattern Regular;Unlabored  Chest Assessment Chest expansion symmetrical  Bilateral Breath Sounds Clear;Diminished  Cardiac  Pulse Regular  Heart Sounds No adventitious heart sounds  Jugular Venous Distention (JVD) No  ECG Monitor Yes  Cardiac Rhythm SB  Vascular  R Radial Pulse +2  L Radial Pulse +2  Integumentary  Integumentary (WDL) WDL  Musculoskeletal  Musculoskeletal (WDL) X  Generalized Weakness Yes  Gastrointestinal  Bowel Sounds Assessment Active  GU Assessment  Genitourinary (WDL) X  Genitourinary Symptoms Oliguria  Psychosocial  Psychosocial (WDL) WDL  Patient Behaviors Calm;Cooperative  Needs Expressed Financial  Emotional support given Given to patient

## 2018-11-14 NOTE — Progress Notes (Signed)
Patient cleared for discharge. All instructions and AVS given. Patient instructed me to throw it in the trash. IV removed. EMS called. Awaiting transport to [facility] at [facility].

## 2018-11-14 NOTE — Progress Notes (Signed)
Pre HD Assessment    11/14/18 0927  Neurological  Level of Consciousness Alert  Orientation Level Oriented X4  Respiratory  Respiratory Pattern Regular;Unlabored  Chest Assessment Chest expansion symmetrical  Bilateral Breath Sounds Clear;Diminished  Cough None  Cardiac  Pulse Regular  Heart Sounds No adventitious heart sounds  Jugular Venous Distention (JVD) No  ECG Monitor Yes  Cardiac Rhythm SB  Vascular  R Radial Pulse +2  L Radial Pulse +2  Integumentary  Integumentary (WDL) WDL  Musculoskeletal  Musculoskeletal (WDL) X  Generalized Weakness Yes  Gastrointestinal  Bowel Sounds Assessment Active  GU Assessment  Genitourinary (WDL) X  Genitourinary Symptoms Oliguria  Psychosocial  Psychosocial (WDL) WDL  Patient Behaviors Calm;Cooperative  Needs Expressed Financial  Emotional support given Given to patient

## 2018-11-14 NOTE — Progress Notes (Signed)
Post HD Tx    11/14/18 1233  Vital Signs  Pulse Rate 91  Resp 18  BP (!) 106/58  Oxygen Therapy  SpO2 97 %  Post-Hemodialysis Assessment  Rinseback Volume (mL) 250 mL  KECN 76.6 V  Dialyzer Clearance Lightly streaked  Duration of HD Treatment -hour(s) 3 hour(s)  Hemodialysis Intake (mL) 500 mL  UF Total -Machine (mL) 1505 mL  Net UF (mL) 1005 mL  Tolerated HD Treatment Yes  AVG/AVF Arterial Site Held (minutes) 5 minutes  AVG/AVF Venous Site Held (minutes) 5 minutes  Fistula / Graft Left Upper arm Arteriovenous fistula  No Placement Date or Time found.   Orientation: Left  Access Location: Upper arm  Access Type: Arteriovenous fistula  Site Condition No complications  Fistula / Graft Assessment Present;Thrill;Bruit  Status Deaccessed  Drainage Description None

## 2018-11-14 NOTE — Progress Notes (Signed)
Pre HD Tx   11/14/18 0929  Hand-Off documentation  Report given to (Full Name) Beatris Ship, RN   Report received from (Full Name) Lewie Chamber, RN   Vital Signs  Temp 98.4 F (36.9 C)  Temp Source Oral  Pulse Rate (!) 59  Pulse Rate Source Monitor  Resp 14  BP (!) 115/97  BP Location Right Arm  BP Method Automatic  Patient Position (if appropriate) Lying  Oxygen Therapy  SpO2 99 %  O2 Device Room Air  Pulse Oximetry Type Continuous  Pain Assessment  Pain Scale 0-10  Pain Score 0  Dialysis Weight  Weight 105.9 kg  Type of Weight Pre-Dialysis  Time-Out for Hemodialysis  What Procedure? HD   Pt Identifiers(min of two) First/Last Name;MRN/Account#  Correct Site? Yes  Correct Side? Yes  Correct Procedure? Yes  Consents Verified? Yes  Rad Studies Available? N/A  Safety Precautions Reviewed? Yes  Engineer, civil (consulting) Number 7  Station Number 2  UF/Alarm Test Passed  Conductivity: Meter 13.8  Conductivity: Machine  13.9  pH 7.4  Reverse Osmosis Main  Normal Saline Lot Number [id number]  Dialyzer Lot Number [id number]  Disposable Set Lot Number [id number]  Machine Temperature 98.6 F (37 C)  Musician and Audible Yes  Blood Lines Intact and Secured Yes  Pre Treatment Patient Checks  Vascular access used during treatment Fistula  Hepatitis B Surface Antigen Results Negative  Date Hepatitis B Surface Antigen Drawn 11/09/18  Hepatitis B Surface Antibody  (<10)  Date Hepatitis B Surface Antibody Drawn 02/22/18  Hemodialysis Consent Verified Yes  Hemodialysis Standing Orders Initiated Yes  ECG (Telemetry) Monitor On Yes  Prime Ordered Normal Saline  Length of  DialysisTreatment -hour(s) 3 Hour(s)  Dialysis Treatment Comments Na 140  Dialyzer Elisio 17H NR  Dialysate 2K;2.5 Ca  Dialysis Anticoagulant None  Dialysate Flow Ordered 600  Blood Flow Rate Ordered 400 mL/min  Ultrafiltration Goal 1 Liters  Dialysis Blood Pressure Support Ordered Normal Saline   Education / Care Plan  Dialysis Education Provided Yes  Documented Education in Care Plan Yes  Fistula / Graft Left Upper arm Arteriovenous fistula  No Placement Date or Time found.   Orientation: Left  Access Location: Upper arm  Access Type: Arteriovenous fistula  Site Condition No complications  Fistula / Graft Assessment Present;Thrill;Bruit  Status Patent  Drainage Description None

## 2018-11-14 NOTE — TOC Transition Note (Signed)
Transition of Care [facility]) - CM/SW Discharge Note   Patient Details  Name: AIRIK GUILBERT MRN: [id number] Date of Birth: 1945/10/22  Transition of Care [facility]) CM/SW Contact:  Beverly Sessions, RN Phone Number: 11/14/2018, 3:00 PM   Clinical Narrative:     Patient to discharge back to the hotel he has been living at today.  Will go via EMS.  Patient states that his WC is here.  Per MD and patient there are no needs for home health  Patient confirms that he has [place] transportation arranged to take him to outpatient HD on Friday  DC summary faxed to [place] dialysis liaison notified of discharge  EMS packet on chart.  Bedside RN notified  Final next level of care: Home/Self Care Barriers to Discharge: Barriers Resolved   Patient Goals and CMS Choice        Discharge Placement                       Discharge Plan and Services                                     Social Determinants of Health (SDOH) Interventions     Readmission Risk Interventions Readmission Risk Prevention Plan 11/14/2018 11/09/2018  Transportation Screening Complete Complete  PCP or Specialist Appt within 3-5 Days Patient refused -  Palliative Care Screening Not Applicable -  Medication Review (RN Care Manager) Complete Complete  Some recent data might be hidden

## 2018-11-14 NOTE — Progress Notes (Signed)
HD Tx ended, tolerated well, uf under goal but acceptable per MD.    11/14/18 1230  Vital Signs  Pulse Rate (!) 43  Resp 14  BP (!) 112/59  Oxygen Therapy  SpO2 92 %  During Hemodialysis Assessment  HD Safety Checks Performed Yes  KECN 76.6 KECN  Dialysis Fluid Bolus Normal Saline  Bolus Amount (mL) 250 mL  Intra-Hemodialysis Comments Tx completed;Tolerated well

## 2019-04-28 ENCOUNTER — Encounter: Payer: Self-pay | Admitting: Emergency Medicine

## 2019-04-28 ENCOUNTER — Other Ambulatory Visit: Payer: Self-pay

## 2019-04-28 ENCOUNTER — Emergency Department
Admission: EM | Admit: 2019-04-28 | Discharge: 2019-04-30 | Disposition: A | Discharge status: Other Acute Inpatient Hospital | Payer: No Typology Code available for payment source | Attending: Student | Admitting: Student

## 2019-04-28 DIAGNOSIS — E876 Hypokalemia: Secondary | ICD-10-CM

## 2019-04-28 DIAGNOSIS — Z7901 Long term (current) use of anticoagulants: Secondary | ICD-10-CM | POA: Insufficient documentation

## 2019-04-28 DIAGNOSIS — M25551 Pain in right hip: Secondary | ICD-10-CM | POA: Diagnosis not present

## 2019-04-28 DIAGNOSIS — Z79899 Other long term (current) drug therapy: Secondary | ICD-10-CM | POA: Diagnosis not present

## 2019-04-28 DIAGNOSIS — R197 Diarrhea, unspecified: Secondary | ICD-10-CM | POA: Insufficient documentation

## 2019-04-28 DIAGNOSIS — Z7982 Long term (current) use of aspirin: Secondary | ICD-10-CM | POA: Insufficient documentation

## 2019-04-28 DIAGNOSIS — Z992 Dependence on renal dialysis: Secondary | ICD-10-CM | POA: Diagnosis not present

## 2019-04-28 DIAGNOSIS — Z96643 Presence of artificial hip joint, bilateral: Secondary | ICD-10-CM | POA: Diagnosis not present

## 2019-04-28 DIAGNOSIS — E1122 Type 2 diabetes mellitus with diabetic chronic kidney disease: Secondary | ICD-10-CM | POA: Diagnosis not present

## 2019-04-28 DIAGNOSIS — N186 End stage renal disease: Secondary | ICD-10-CM | POA: Diagnosis not present

## 2019-04-28 DIAGNOSIS — R52 Pain, unspecified: Secondary | ICD-10-CM

## 2019-04-28 DIAGNOSIS — Z20822 Contact with and (suspected) exposure to covid-19: Secondary | ICD-10-CM | POA: Diagnosis not present

## 2019-04-28 LAB — CBC
HCT: 30.7 % — ABNORMAL LOW (ref 39.0–52.0)
Hemoglobin: 9.7 g/dL — ABNORMAL LOW (ref 13.0–17.0)
MCH: 31.6 pg (ref 26.0–34.0)
MCHC: 31.6 g/dL (ref 30.0–36.0)
MCV: 100 fL (ref 80.0–100.0)
Platelets: 110 10*3/uL — ABNORMAL LOW (ref 150–400)
RBC: 3.07 MIL/uL — ABNORMAL LOW (ref 4.22–5.81)
RDW: 15.2 % (ref 11.5–15.5)
WBC: 4.2 10*3/uL (ref 4.0–10.5)
nRBC: 0 % (ref 0.0–0.2)

## 2019-04-28 LAB — COMPREHENSIVE METABOLIC PANEL
ALT: 11 U/L (ref 0–44)
AST: 15 U/L (ref 15–41)
Albumin: 3.2 g/dL — ABNORMAL LOW (ref 3.5–5.0)
Alkaline Phosphatase: 93 U/L (ref 38–126)
Anion gap: 12 (ref 5–15)
BUN: 22 mg/dL (ref 8–23)
CO2: 31 mmol/L (ref 22–32)
Calcium: 8.6 mg/dL — ABNORMAL LOW (ref 8.9–10.3)
Chloride: 97 mmol/L — ABNORMAL LOW (ref 98–111)
Creatinine, Ser: 5.29 mg/dL — ABNORMAL HIGH (ref 0.61–1.24)
GFR calc Af Amer: 11 mL/min — ABNORMAL LOW (ref >60)
GFR calc non Af Amer: 10 mL/min — ABNORMAL LOW (ref >60)
Glucose, Bld: 101 mg/dL — ABNORMAL HIGH (ref 70–99)
Potassium: 2.8 mmol/L — ABNORMAL LOW (ref 3.5–5.1)
Sodium: 140 mmol/L (ref 135–145)
Total Bilirubin: 0.9 mg/dL (ref 0.3–1.2)
Total Protein: 5.7 g/dL — ABNORMAL LOW (ref 6.5–8.1)

## 2019-04-28 LAB — LIPASE, BLOOD: Lipase: 23 U/L (ref 11–51)

## 2019-04-28 MED ORDER — ATORVASTATIN CALCIUM 20 MG PO TABS
80.0000 mg | ORAL_TABLET | Freq: Every evening | ORAL | Status: DC
  Administered 2019-04-29: 23:00:00 80 mg via ORAL
  Filled 2019-04-28 (×2): qty 4

## 2019-04-28 MED ORDER — MIDODRINE HCL 5 MG PO TABS
2.5000 mg | ORAL_TABLET | Freq: Three times a day (TID) | ORAL | Status: DC
  Administered 2019-04-29 (×2): 2.5 mg via ORAL
  Filled 2019-04-28 (×5): qty 0.5

## 2019-04-28 MED ORDER — POTASSIUM CHLORIDE CRYS ER 20 MEQ PO TBCR
20.0000 meq | EXTENDED_RELEASE_TABLET | Freq: Once | ORAL | Status: AC
  Administered 2019-04-29: 01:00:00 20 meq via ORAL
  Filled 2019-04-28: qty 1

## 2019-04-28 MED ORDER — SEVELAMER CARBONATE 800 MG PO TABS
800.0000 mg | ORAL_TABLET | Freq: Three times a day (TID) | ORAL | Status: DC
  Administered 2019-04-29: 10:00:00 800 mg via ORAL
  Filled 2019-04-28 (×5): qty 1

## 2019-04-28 MED ORDER — OXYCODONE-ACETAMINOPHEN 5-325 MG PO TABS
1.0000 | ORAL_TABLET | Freq: Four times a day (QID) | ORAL | Status: DC | PRN
  Administered 2019-04-29 – 2019-04-30 (×4): 1 via ORAL
  Filled 2019-04-28 (×4): qty 1

## 2019-04-28 MED ORDER — PREGABALIN 25 MG PO CAPS
25.0000 mg | ORAL_CAPSULE | Freq: Every day | ORAL | Status: DC
  Administered 2019-04-29: 23:00:00 25 mg via ORAL
  Filled 2019-04-28: qty 1

## 2019-04-28 MED ORDER — CALCITRIOL 0.25 MCG PO CAPS
0.2500 ug | ORAL_CAPSULE | Freq: Every day | ORAL | Status: DC
  Administered 2019-04-29: 10:00:00 0.25 ug via ORAL
  Filled 2019-04-28 (×2): qty 1

## 2019-04-28 MED ORDER — METOPROLOL TARTRATE 25 MG PO TABS
25.0000 mg | ORAL_TABLET | Freq: Two times a day (BID) | ORAL | Status: DC
  Administered 2019-04-29 (×2): 25 mg via ORAL
  Filled 2019-04-28 (×3): qty 1

## 2019-04-28 NOTE — ED Triage Notes (Signed)
Pt arrives via [facility] with c/o diarrhea x1 week. Pt states that he has had a recent right hip surgery due to infection in the same x 1 month ago. Pt is in NAD.

## 2019-04-28 NOTE — ED Notes (Signed)
Pt reports that he was kicked out of his motel and has been living in his car as of yesterday. Reports he does not have any family or friends.  Pt states he had been taking antibiotics but stopped d/t diarrhea.  Last dialysis was Saturday.

## 2019-04-28 NOTE — ED Triage Notes (Signed)
Pt to ED via ems with reports that patient is homeless and a BLE amputee. Pt was kicked out of motel d/t length of stay. Pt today began having diarrhea.

## 2019-04-28 NOTE — ED Notes (Signed)
ED Provider at bedside. 

## 2019-04-28 NOTE — ED Notes (Signed)
IV attempt x 1 by this RN. Pt has fistula on left arm and only has right arm for access.

## 2019-04-28 NOTE — ED Notes (Signed)
Pt cleaned at this time from moderate amount of stool. Pt assisted into stretcher by this RN, Doretha Sou RN, Eliezer Lofts RN, and Melody EDT. Pt placed into clean hospital provided blue pants at this time.

## 2019-04-28 NOTE — ED Provider Notes (Signed)
[facility] Emergency Department Provider Note   ____________________________________________   First MD Initiated Contact with Patient 04/28/19 2306     (approximate)  I have reviewed the triage vital signs and the nursing notes.   HISTORY  Chief Complaint Diarrhea    HPI Antonio Phillips is a 74 y.o. male brought to the ED via EMS from local motel with a chief complaint of diarrhea.  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  Reports diarrhea for the past 3 to 4 weeks.  Here tonight because a police officer brought him [employer] fried chicken and he stooled himself in his vehicle after he ate it.  Also tells me he got kicked out of the local motel yesterday after his funding stopped.  Has no home health resources.  Is a bilateral amputee.  Also tells me he was supposed to continue oral antibiotics for 90 days but he stopped them last week due to the continuing diarrhea.  Denies fever, chills, cough, chest pain, shortness of breath, abdominal pain, nausea or vomiting.  Does state he took dialysis yesterday.       Past Medical History:  Diagnosis Date  . Atrial fibrillation ([place])   . Diabetes ([place])    Pt states that he has no hx of diabetes  . ESRD (end stage renal disease) [facility])     Patient Active Problem List   Diagnosis Date Noted  . Hyperkalemia 04/25/2018  . UTI (urinary tract infection) 04/25/2018  . Diabetes ([place]) 04/25/2018  . ESRD on dialysis [facility]) 04/25/2018    Past Surgical History:  Procedure Laterality Date  . AV FISTULA REPAIR    . BELOW KNEE LEG AMPUTATION Bilateral   . CHOLECYSTECTOMY    . TOTAL HIP ARTHROPLASTY Bilateral     Prior to Admission medications   Medication Sig Start Date End Date Taking? Authorizing Provider  acetaminophen (TYLENOL) 500 MG tablet Take 1,000 mg by mouth every 8 (eight) hours as needed.  11/18/17  Yes [provider]  aspirin EC 81 MG tablet Take 81 mg by mouth daily.   Yes [provider]  atorvastatin (LIPITOR) 20 MG tablet Take 20 mg by mouth daily. 03/13/19  Yes [provider]  finasteride (PROSCAR) 5 MG tablet Take 5 mg by mouth daily. 03/13/19  Yes [provider]  metoprolol tartrate (LOPRESSOR) 25 MG tablet Take 25 mg by mouth 2 (two) times daily.   Yes [provider]  midodrine (PROAMATINE) 2.5 MG tablet Take 2.5 mg by mouth 3 (three) times daily with meals.   Yes [provider]  oxyCODONE-acetaminophen (PERCOCET) 5-325 MG tablet Take 1 tablet by mouth every 6 (six) hours as needed for severe pain. 07/31/18 07/31/19 Yes Nena Polio, MD  pregabalin (LYRICA) 25 MG capsule Take 25 mg by mouth Nightly. 11/02/17  Yes [provider]  rOPINIRole (REQUIP) 1 MG tablet Take 1 mg by mouth at bedtime. 03/13/19  Yes [provider]  sevelamer carbonate (RENVELA) 800 MG tablet Take 800 mg by mouth 3 (three) times daily with meals. 06/08/18 06/08/19 Yes [provider]  calcitRIOL (ROCALTROL) 0.25 MCG capsule Take 0.25 mcg by mouth daily.    [provider]  carboxymethylcellulose (REFRESH PLUS) 0.5 % SOLN Apply 1 drop to eye every 8 (eight) hours as needed. 02/19/18   [provider]  furosemide (LASIX) 20 MG tablet Take 10-20  mg by mouth daily. Non-dialysis days    [provider]  warfarin (COUMADIN) 2.5 MG tablet Take 2.5 mg by mouth daily. Take 1 tablet by mouth sat/t/th, take 2 tablets m/w/f/sun 11/18/17 11/16/18  [provider]    Allergies Patient has no known allergies.  No family history on file.  Social History Social History   Tobacco Use  . Smoking status: Never Smoker  . Smokeless tobacco: Never Used  Substance Use Topics  . Alcohol use: Never  . Drug use: Never    Review of Systems  Constitutional: No fever/chills Eyes: No visual changes. ENT: No  sore throat. Cardiovascular: Denies chest pain. Respiratory: Denies shortness of breath. Gastrointestinal: No abdominal pain.  No nausea, no vomiting.  Positive for diarrhea.  No constipation. Genitourinary: Negative for dysuria. Musculoskeletal: Negative for back pain. Skin: Negative for rash. Neurological: Negative for headaches, focal weakness or numbness.   ____________________________________________   PHYSICAL EXAM:  VITAL SIGNS: ED Triage Vitals  Enc Vitals Group     BP 04/28/19 2020 121/80     Pulse Rate 04/28/19 2020 67     Resp 04/28/19 2020 18     Temp 04/28/19 2020 (!) 97.5 F (36.4 C)     Temp Source 04/28/19 2020 Oral     SpO2 04/28/19 2020 100 %     Weight 04/28/19 2021 (!) 399 lb 4.1 oz (181.1 kg)     Height 04/28/19 2021 5\' 11"  (1.803 m)     Head Circumference --      Peak Flow --      Pain Score 04/28/19 2020 8     Pain Loc --      Pain Edu? --      Excl. in [place]? --     Constitutional: Alert and oriented. Well appearing and in no acute distress. Eyes: Conjunctivae are normal. PERRL. EOMI. Head: Atraumatic. Nose: No congestion/rhinnorhea. Mouth/Throat: Mucous membranes are moist.  Oropharynx non-erythematous. Neck: No stridor.   Cardiovascular: Normal rate, regular rhythm. Grossly normal heart sounds.  Good peripheral circulation. Respiratory: Normal respiratory effort.  No retractions. Lungs CTAB. Gastrointestinal: Soft and nontender to D palpation. No distention. No abdominal bruits. No CVA tenderness. 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 Neurologic:  Normal speech and language. No gross focal neurologic deficits are appreciated. Skin:  Skin is warm, dry and intact. No rash noted. Psychiatric: Mood and affect are normal. Speech and behavior are normal.  ____________________________________________   LABS (all labs ordered are listed, but only abnormal results are displayed)  Labs Reviewed  COMPREHENSIVE  METABOLIC PANEL - Abnormal; Notable for the following components:      Result Value   Potassium 2.8 (*)    Chloride 97 (*)    Glucose, Bld 101 (*)    Creatinine, Ser 5.29 (*)    Calcium 8.6 (*)    Total Protein 5.7 (*)    Albumin 3.2 (*)    GFR calc non Af Amer 10 (*)    GFR calc Af Amer 11 (*)    All other components within normal limits  CBC - Abnormal; Notable for the following components:   RBC 3.07 (*)    Hemoglobin 9.7 (*)    HCT 30.7 (*)    Platelets 110 (*)    All other components within normal limits  URINALYSIS, COMPLETE (UACMP) WITH MICROSCOPIC - Abnormal; Notable for the following components:   Color, Urine YELLOW (*)    APPearance HAZY (*)  Glucose, UA 150 (*)    Ketones, ur 5 (*)    Protein, ur 100 (*)    Leukocytes,Ua SMALL (*)    WBC, UA >50 (*)    Bacteria, UA RARE (*)    All other components within normal limits  C DIFFICILE QUICK SCREEN W PCR REFLEX  GASTROINTESTINAL PANEL BY PCR, STOOL (REPLACES STOOL CULTURE)  SARS CORONAVIRUS 2 (TAT 6-24 HRS)  URINE CULTURE  LIPASE, BLOOD   ____________________________________________  EKG  ED ECG REPORT I, Makaiya Geerdes J, the attending physician, personally viewed and interpreted this ECG.   Date: 04/29/2019  EKG Time: 0116  Rate: 102  Rhythm: normal EKG, normal sinus rhythm  Axis: Normal  Intervals:none  ST&T Change: Nonspecific  ____________________________________________  RADIOLOGY  ED MD interpretation:  None  Official radiology report(s): No results found.  ____________________________________________   PROCEDURES  Procedure(s) performed (including Critical Care):  Procedures   ____________________________________________   INITIAL IMPRESSION / ASSESSMENT AND PLAN / ED COURSE  As part of my medical decision making, I reviewed the following data within the [facility] notes reviewed and incorporated, Labs reviewed, EKG interpreted, Old chart reviewed and  Notes from prior ED visits     AVAAN VINK was evaluated in Emergency Department on 04/29/2019 for the symptoms described in the history of present illness. He was evaluated in the context of the global COVID-19 pandemic, which necessitated consideration that the patient might be at risk for infection with the SARS-CoV-2 virus that causes COVID-19. Institutional protocols and algorithms that pertain to the evaluation of patients at risk for COVID-19 are in a state of rapid change based on information released by regulatory bodies including the CDC and federal and state organizations. These policies and algorithms were followed during the patient's care in the ED.    74 year old male with diarrhea x several weeks. Differential diagnosis includes, but is not limited to, acute appendicitis, renal colic, testicular torsion, urinary tract infection/pyelonephritis, prostatitis,  epididymitis, diverticulitis, small bowel obstruction or ileus, colitis, abdominal aortic aneurysm, gastroenteritis, hernia, etc.  Laboratory results unremarkable other than mild hypokalemia. Will replete with just 18meq KCl as patient is a dialysis patient.  His presentation is complicated by social issues - homelessness, lack of resources for home health aide, etc.  Will board overnight in the ED for social work consultation in the a.m. for possible placement.   Clinical Course as of Apr 28 624  Mon Apr 29, 2019  [id number] Patient eating and drinking.  C. difficile negative.  Urine culture was added for asymptomatic pyuria.   [JS]  0621 Biofire negative.  No further events overnight.  Patient remains in the emergency department pending social work consult.   [JS]    Clinical Course User Index [JS] Paulette Blanch, MD     ____________________________________________   FINAL CLINICAL IMPRESSION(S) / ED DIAGNOSES  Final diagnoses:  Diarrhea, unspecified type  ESRD (end stage renal disease) on dialysis [facility])   Hypokalemia     ED Discharge Orders    None       Note:  This document was prepared using Dragon voice recognition software and may include unintentional dictation errors.   Paulette Blanch, MD 04/29/19 [phone]

## 2019-04-29 ENCOUNTER — Emergency Department: Payer: No Typology Code available for payment source

## 2019-04-29 LAB — BASIC METABOLIC PANEL
Anion gap: 13 (ref 5–15)
BUN: 24 mg/dL — ABNORMAL HIGH (ref 8–23)
CO2: 31 mmol/L (ref 22–32)
Calcium: 8.3 mg/dL — ABNORMAL LOW (ref 8.9–10.3)
Chloride: 97 mmol/L — ABNORMAL LOW (ref 98–111)
Creatinine, Ser: 5.94 mg/dL — ABNORMAL HIGH (ref 0.61–1.24)
GFR calc Af Amer: 10 mL/min — ABNORMAL LOW (ref >60)
GFR calc non Af Amer: 9 mL/min — ABNORMAL LOW (ref >60)
Glucose, Bld: 148 mg/dL — ABNORMAL HIGH (ref 70–99)
Potassium: 3.3 mmol/L — ABNORMAL LOW (ref 3.5–5.1)
Sodium: 141 mmol/L (ref 135–145)

## 2019-04-29 LAB — GASTROINTESTINAL PANEL BY PCR, STOOL (REPLACES STOOL CULTURE)

## 2019-04-29 LAB — CBC WITH DIFFERENTIAL/PLATELET
Abs Immature Granulocytes: 0.02 10*3/uL (ref 0.00–0.07)
Basophils Absolute: 0 10*3/uL (ref 0.0–0.1)
Basophils Relative: 0 %
Eosinophils Absolute: 0.1 10*3/uL (ref 0.0–0.5)
Eosinophils Relative: 2 %
HCT: 29.4 % — ABNORMAL LOW (ref 39.0–52.0)
Hemoglobin: 9.4 g/dL — ABNORMAL LOW (ref 13.0–17.0)
Immature Granulocytes: 1 %
Lymphocytes Relative: 34 %
Lymphs Abs: 1.3 10*3/uL (ref 0.7–4.0)
MCH: 31.5 pg (ref 26.0–34.0)
MCHC: 32 g/dL (ref 30.0–36.0)
MCV: 98.7 fL (ref 80.0–100.0)
Monocytes Absolute: 0.3 10*3/uL (ref 0.1–1.0)
Monocytes Relative: 8 %
Neutro Abs: 2.1 10*3/uL (ref 1.7–7.7)
Neutrophils Relative %: 55 %
Platelets: 108 10*3/uL — ABNORMAL LOW (ref 150–400)
RBC: 2.98 MIL/uL — ABNORMAL LOW (ref 4.22–5.81)
RDW: 15.4 % (ref 11.5–15.5)
WBC: 3.9 10*3/uL — ABNORMAL LOW (ref 4.0–10.5)
nRBC: 0 % (ref 0.0–0.2)

## 2019-04-29 LAB — URINALYSIS, COMPLETE (UACMP) WITH MICROSCOPIC
Bilirubin Urine: NEGATIVE
Glucose, UA: 150 mg/dL — AB
Hgb urine dipstick: NEGATIVE
Ketones, ur: 5 mg/dL — AB
Nitrite: NEGATIVE
Protein, ur: 100 mg/dL — AB
Specific Gravity, Urine: 1.008 (ref 1.005–1.030)
WBC, UA: >50 WBC/hpf — ABNORMAL HIGH (ref 0–5)
pH: 8 (ref 5.0–8.0)

## 2019-04-29 LAB — C DIFFICILE QUICK SCREEN W PCR REFLEX
C Diff antigen: NEGATIVE
C Diff interpretation: NOT DETECTED
C Diff toxin: NEGATIVE

## 2019-04-29 LAB — SEDIMENTATION RATE: Sed Rate: 31 mm/hr — ABNORMAL HIGH (ref 0–20)

## 2019-04-29 LAB — SARS CORONAVIRUS 2 (TAT 6-24 HRS): SARS Coronavirus 2: NEGATIVE

## 2019-04-29 LAB — LACTIC ACID, PLASMA
Lactic Acid, Venous: 2.1 mmol/L (ref 0.5–1.9)
Lactic Acid, Venous: 2.1 mmol/L (ref 0.5–1.9)

## 2019-04-29 LAB — C-REACTIVE PROTEIN: CRP: 2.9 mg/dL — ABNORMAL HIGH (ref <1.0)

## 2019-04-29 NOTE — ED Notes (Signed)
Pt given meal tray and drink at this time 

## 2019-04-29 NOTE — ED Notes (Signed)
Pt given sandwich tray 

## 2019-04-29 NOTE — ED Provider Notes (Signed)
[facility] Emergency Department Provider Note   ____________________________________________   First MD Initiated Contact with Patient 04/28/19 2306     (approximate)  I have reviewed the triage vital signs and the nursing notes.   HISTORY  Chief Complaint Diarrhea    HPI Antonio Phillips is a 74 y.o. male brought to the ED via EMS from local motel with a chief complaint of diarrhea.  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  Reports diarrhea for the past 3 to 4 weeks.  Here tonight because a police officer brought him [employer] fried chicken and he stooled himself in his vehicle after he ate it.  Also tells me he got kicked out of the local motel yesterday after his funding stopped.  Has no home health resources.  Is a bilateral amputee.  Also tells me he was supposed to continue oral antibiotics for 90 days but he stopped them last week due to the continuing diarrhea.  Denies fever, chills, cough, chest pain, shortness of breath, abdominal pain, nausea or vomiting.  Does state he took dialysis yesterday.       Past Medical History:  Diagnosis Date  . Atrial fibrillation ([place])   . Diabetes ([place])    Pt states that he has no hx of diabetes  . ESRD (end stage renal disease) [facility])     Patient Active Problem List   Diagnosis Date Noted  . Hyperkalemia 04/25/2018  . UTI (urinary tract infection) 04/25/2018  . Diabetes ([place]) 04/25/2018  . ESRD on dialysis [facility]) 04/25/2018    Past Surgical History:  Procedure Laterality Date  . AV FISTULA REPAIR    . BELOW KNEE LEG AMPUTATION Bilateral   . CHOLECYSTECTOMY    . TOTAL HIP ARTHROPLASTY Bilateral     Prior to Admission medications   Medication Sig Start Date End Date Taking? Authorizing Provider  acetaminophen (TYLENOL) 500 MG tablet Take 1,000 mg by mouth every 8 (eight) hours as needed.  11/18/17  Yes [provider]  aspirin EC 81 MG tablet Take 81 mg by mouth daily.   Yes [provider]  atorvastatin (LIPITOR) 20 MG tablet Take 20 mg by mouth daily. 03/13/19  Yes [provider]  finasteride (PROSCAR) 5 MG tablet Take 5 mg by mouth daily. 03/13/19  Yes [provider]  metoprolol tartrate (LOPRESSOR) 25 MG tablet Take 25 mg by mouth 2 (two) times daily.   Yes [provider]  midodrine (PROAMATINE) 2.5 MG tablet Take 2.5 mg by mouth 3 (three) times daily with meals.   Yes [provider]  oxyCODONE-acetaminophen (PERCOCET) 5-325 MG tablet Take 1 tablet by mouth every 6 (six) hours as needed for severe pain. 07/31/18 07/31/19 Yes Nena Polio, MD  pregabalin (LYRICA) 25 MG capsule Take 25 mg by mouth Nightly. 11/02/17  Yes [provider]  rOPINIRole (REQUIP) 1 MG tablet Take 1 mg by mouth at bedtime. 03/13/19  Yes [provider]  sevelamer carbonate (RENVELA) 800 MG tablet Take 800 mg by mouth 3 (three) times daily with meals. 06/08/18 06/08/19 Yes [provider]  calcitRIOL (ROCALTROL) 0.25 MCG capsule Take 0.25 mcg by mouth daily.    [provider]  carboxymethylcellulose (REFRESH PLUS) 0.5 % SOLN Apply 1 drop to eye every 8 (eight) hours as needed. 02/19/18   [provider]  furosemide (LASIX) 20 MG tablet Take 10-20  mg by mouth daily. Non-dialysis days    [provider]  warfarin (COUMADIN) 2.5 MG tablet Take 2.5 mg by mouth daily. Take 1 tablet by mouth sat/t/th, take 2 tablets m/w/f/sun 11/18/17 11/16/18  [provider]    Allergies Patient has no known allergies.  No family history on file.  Social History Social History   Tobacco Use  . Smoking status: Never Smoker  . Smokeless tobacco: Never Used  Substance Use Topics  . Alcohol use: Never  . Drug use: Never    Review of Systems  Constitutional: No fever/chills Eyes: No visual changes. ENT: No  sore throat. Cardiovascular: Denies chest pain. Respiratory: Denies shortness of breath. Gastrointestinal: No abdominal pain.  No nausea, no vomiting.  Positive for diarrhea.  No constipation. Genitourinary: Negative for dysuria. Musculoskeletal: Negative for back pain. Skin: Negative for rash. Neurological: Negative for headaches, focal weakness or numbness.   ____________________________________________   PHYSICAL EXAM:  VITAL SIGNS: ED Triage Vitals  Enc Vitals Group     BP 04/28/19 2020 121/80     Pulse Rate 04/28/19 2020 67     Resp 04/28/19 2020 18     Temp 04/28/19 2020 (!) 97.5 F (36.4 C)     Temp Source 04/28/19 2020 Oral     SpO2 04/28/19 2020 100 %     Weight 04/28/19 2021 (!) 399 lb 4.1 oz (181.1 kg)     Height 04/28/19 2021 5\' 11"  (1.803 m)     Head Circumference --      Peak Flow --      Pain Score 04/28/19 2020 8     Pain Loc --      Pain Edu? --      Excl. in [place]? --     Constitutional: Alert and oriented. Well appearing and in no acute distress. Eyes: Conjunctivae are normal. PERRL. EOMI. Head: Atraumatic. Nose: No congestion/rhinnorhea. Mouth/Throat: Mucous membranes are moist.  Oropharynx non-erythematous. Neck: No stridor.   Cardiovascular: Normal rate, regular rhythm. Grossly normal heart sounds.  Good peripheral circulation. Respiratory: Normal respiratory effort.  No retractions. Lungs CTAB. Gastrointestinal: Soft and nontender to light or deep palpation. No distention. No abdominal bruits. No CVA tenderness. 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  Dirty bandage over right hip incision site.  Incision site clean/dry/intact. Neurologic:  Normal speech and language. No gross focal neurologic deficits are appreciated. Skin:  Skin is warm, dry and intact. No rash noted. Psychiatric: Mood and affect are normal. Speech and behavior are normal.  ____________________________________________   LABS (all labs ordered  are listed, but only abnormal results are displayed)  Labs Reviewed  COMPREHENSIVE METABOLIC PANEL - Abnormal; Notable for the following components:      Result Value   Potassium 2.8 (*)    Chloride 97 (*)    Glucose, Bld 101 (*)    Creatinine, Ser 5.29 (*)    Calcium 8.6 (*)    Total Protein 5.7 (*)    Albumin 3.2 (*)    GFR calc non Af Amer 10 (*)    GFR calc Af Amer 11 (*)    All other components within normal limits  CBC - Abnormal; Notable for the following components:   RBC 3.07 (*)    Hemoglobin 9.7 (*)    HCT 30.7 (*)    Platelets 110 (*)    All other components within normal limits  URINALYSIS, COMPLETE (UACMP) WITH MICROSCOPIC - Abnormal; Notable for the following components:  Color, Urine YELLOW (*)    APPearance HAZY (*)    Glucose, UA 150 (*)    Ketones, ur 5 (*)    Protein, ur 100 (*)    Leukocytes,Ua SMALL (*)    WBC, UA >50 (*)    Bacteria, UA RARE (*)    All other components within normal limits  C DIFFICILE QUICK SCREEN W PCR REFLEX  GASTROINTESTINAL PANEL BY PCR, STOOL (REPLACES STOOL CULTURE)  SARS CORONAVIRUS 2 (TAT 6-24 HRS)  URINE CULTURE  LIPASE, BLOOD   ____________________________________________  EKG  ED ECG REPORT I, Elva Breaker J, the attending physician, personally viewed and interpreted this ECG.   Date: 04/29/2019  EKG Time: 0116  Rate: 102  Rhythm: normal EKG, normal sinus rhythm  Axis: Normal  Intervals:none  ST&T Change: Nonspecific  ____________________________________________  RADIOLOGY  ED MD interpretation:  None  Official radiology report(s): No results found.  ____________________________________________   PROCEDURES  Procedure(s) performed (including Critical Care):  Procedures   ____________________________________________   INITIAL IMPRESSION / ASSESSMENT AND PLAN / ED COURSE  As part of my medical decision making, I reviewed the following data within the [place] notes  reviewed and incorporated, Labs reviewed, EKG interpreted, Old chart reviewed and Notes from prior ED visits     MASUO GOVEIA was evaluated in Emergency Department on 04/29/2019 for the symptoms described in the history of present illness. He was evaluated in the context of the global COVID-19 pandemic, which necessitated consideration that the patient might be at risk for infection with the SARS-CoV-2 virus that causes COVID-19. Institutional protocols and algorithms that pertain to the evaluation of patients at risk for COVID-19 are in a state of rapid change based on information released by regulatory bodies including the CDC and federal and state organizations. These policies and algorithms were followed during the patient's care in the ED.    74 year old male with diarrhea x several weeks. Differential diagnosis includes, but is not limited to, acute appendicitis, renal colic, testicular torsion, urinary tract infection/pyelonephritis, prostatitis,  epididymitis, diverticulitis, small bowel obstruction or ileus, colitis, abdominal aortic aneurysm, gastroenteritis, hernia, etc.  Laboratory results unremarkable other than mild hypokalemia. Will replete with just 46meq KCl as patient is a dialysis patient.  His presentation is complicated by social issues - homelessness, lack of resources for home health aide, etc.  Will board overnight in the ED for social work consultation in the a.m. for possible placement.   Clinical Course as of Apr 28 620  Mon Apr 29, 2019  0146 Patient eating and drinking.  C. difficile negative.  Urine culture was added for asymptomatic pyuria.   [JS]  0621 Biofire negative.  No further events overnight.  Patient remains in the emergency department pending social work consult.   [JS]    Clinical Course User Index [JS] Paulette Blanch, MD     ____________________________________________   FINAL CLINICAL IMPRESSION(S) / ED DIAGNOSES  Final diagnoses:   Diarrhea, unspecified type  ESRD (end stage renal disease) on dialysis [facility])  Hypokalemia     ED Discharge Orders    None       Note:  This document was prepared using Dragon voice recognition software and may include unintentional dictation errors.   Paulette Blanch, MD 04/29/19 [phone]

## 2019-04-29 NOTE — ED Notes (Signed)
Pt to MRI

## 2019-04-29 NOTE — Progress Notes (Signed)
PT Cancellation Note  Patient Details Name: Antonio Phillips MRN: [id number] DOB: 10-28-1945   Cancelled Treatment:    Reason Eval/Treat Not Completed: Medical issues which prohibited therapy(Most recent K+ 2.8, outside appropriate range for PT evaluation. Will attempt evaluation at later date time.)   8:28 AM, 04/29/19 Etta Grandchild, PT, DPT Physical Therapist - [facility]  [phone] ([place])    [name] 04/29/2019, 8:28 AM

## 2019-04-29 NOTE — ED Notes (Addendum)
Pt had small diarrhea episode. Pt assisted off of bedpan and cleaned up.  Clean chux and brief applied. Pt pulled up in bed and eating breakfast at this time.

## 2019-04-29 NOTE — TOC Initial Note (Signed)
Transition of Care [facility]) - Initial/Assessment Note    Patient Details  Name: Antonio Phillips MRN: [id number] Date of Birth: 1945/08/22  Transition of [facility]) CM/SW Contact:    Anselm Pancoast, RN Phone Number: 04/29/2019, 12:01 PM  Clinical Narrative:                 Patient states he does not have a home but has a car in which he has been living in for the last couple of days since leaving hotel due to lack of funds. Previous hotel was funded by [employer] of [place] but his funding has ran out. Patient is active with the [place] and states he gets a check for $1100/monthly however he is not interested in shelter stating he is "better than that". Patient also refused group home or SNF placement stating he is not giving up his check or car. Car was towed away by police office [place] who advised patient he would hold it for him until he was discharged. Patient is active with [facility] and states he has a Education officer, museum at the [place] but he does not get along with her and they have not been helpful in the past. Patient agreed to hotel placement if he could afford it but last hotel was charging $300/weekly and he could not afford that. Patient understands that since refusing other options he is only leaving option of returning to the homeless situation. Patient states he understands.   Expected Discharge Plan: [place]     Patient Goals and CMS Choice Patient states their goals for this hospitalization and ongoing recovery are:: Wants to be placed in section 8 housing      Expected Discharge Plan and Services Expected Discharge Plan: Homeless Shelter In-house Referral: Clinical Social Work     Living arrangements for the past 2 months: Hotel/Motel                                      Prior Living Arrangements/Services Living arrangements for the past 2 months: Hotel/Motel Lives with:: Self Patient language and need for interpreter reviewed:: Yes Do you feel safe  going back to the place where you live?: No   No more money for placement  Need for Family Participation in Patient Care: Yes (Comment) Care giver support system in place?: No (comment) Current home services: DME(has prosthetic legs that are being worked on in [employer]") Criminal Activity/Legal Involvement Pertinent to Current Situation/Hospitalization: No - Comment as needed  Activities of Daily Living      Permission Sought/Granted                  Emotional Assessment Appearance:: Appears stated age Attitude/Demeanor/Rapport: Self-Confident, Engaged Affect (typically observed): Defensive Orientation: : Oriented to Self, Oriented to Place, Oriented to  Time, Oriented to Situation Alcohol / Substance Use: Never Used Psych Involvement: No (comment)  Admission diagnosis:  EMS- Fall Patient Active Problem List   Diagnosis Date Noted  . Hyperkalemia 04/25/2018  . UTI (urinary tract infection) 04/25/2018  . Diabetes ([place]) 04/25/2018  . ESRD on dialysis [facility]) 04/25/2018   PCP:  Marsh Dolly, MD Pharmacy:   [place], [address] [place] [address] Phone: [phone] Fax: [phone]     Social Determinants of Health (SDOH) Interventions    Readmission Risk Interventions Readmission Risk Prevention Plan 11/14/2018 11/09/2018  Transportation  Screening Complete Complete  PCP or Specialist Appt within 3-5 Days Patient refused -  Palliative Care Screening Not Applicable -  Medication Review (RN Care Manager) Complete Complete  Some recent data might be hidden

## 2019-04-29 NOTE — ED Provider Notes (Signed)
74 year old male with complex past medical history including end-stage renal disease, history of recent right hip replacement complicated by superinfection requiring IV vancomycin and cefepime as an outpatient, initially presenting with diarrhea.  From a diarrhea perspective, suspect antibiotic associated diarrhea.  C. difficile negative.  GI panel negative.  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  I was asked to evaluate the patient because he was complaining of right hip pain.  Of note, the patient did stop his antibiotics several weeks early due to this diarrhea.  I repeated his labs, which showed mild leukopenia, unclear significance.  Repeat BMP is reassuring.  Sed rate, CRP are pending.  Lactic acid is 2.1 which is his baseline per records, and he has no evidence to suggest sepsis.  Blood pressure is improving.  Of note, he has a history of hypotension for which she is on midodrine, which has been ordered.  I had a long discussion with the patient.  He is adamant that his right hip is hurting him more than usual.  Given his history of recent right hip infection, with incomplete course of outpatient antibiotics and complaints of increasing pain, feels pertinent to check imaging.  No effusion noted on plain film, so will send for MRI.  If positive, would consider transferring to the [place] or seeking additional medical care with aspiration.     Duffy Bruce, MD 04/29/19 1536

## 2019-04-29 NOTE — ED Notes (Signed)
Pt requesting to know why MRI or xrays have not been completed for his hip. MD Isaacs informed.

## 2019-04-29 NOTE — ED Notes (Signed)
Pt given applejuice and ice at this time.

## 2019-04-29 NOTE — ED Notes (Signed)
Pt reports discontinuing antibiotics two weeks after starting them due to diarrhea. Pt reports pain in his post-op hip (Right side) that radiates to his groin

## 2019-04-29 NOTE — Progress Notes (Signed)
PT Cancellation Note  Patient Details Name: Antonio Phillips MRN: [id number] DOB: 1945/10/22   Cancelled Treatment:    Reason Eval/Treat Not Completed: PT screened, no needs identified, will sign off(Pt appears to be at his baseline for mobility. No acute medical issue identified. Per RNCM note, pt refusing SNF placement. No PT evaluation needed at this time. Will sign off.)  4:18 PM, 04/29/19 Etta Grandchild, PT, DPT Physical Therapist - [facility]  [phone] ([place])    [facility] 04/29/2019, 4:17 PM

## 2019-04-29 NOTE — ED Notes (Signed)
Awaiting meds from pharmacy at this time.

## 2019-04-30 ENCOUNTER — Emergency Department: Payer: No Typology Code available for payment source

## 2019-04-30 LAB — URINE CULTURE: Culture: >=100000 — AB

## 2019-04-30 MED ORDER — VANCOMYCIN HCL IN DEXTROSE 1-5 GM/200ML-% IV SOLN: 1000.0000 mg | Freq: Once | INTRAVENOUS | Status: DC

## 2019-04-30 MED ORDER — SODIUM CHLORIDE 0.9 % IV SOLN: 2.0000 g | Freq: Once | INTRAVENOUS | Status: DC

## 2019-04-30 NOTE — ED Notes (Signed)
Pt given apple juice at thsi time

## 2019-04-30 NOTE — ED Notes (Signed)
Pt UA to sing transfer due to sig pad not working properly. Pt verbalizes and agrees to transfer to [facility] hospital.

## 2019-09-09 ENCOUNTER — Emergency Department: Payer: No Typology Code available for payment source

## 2019-09-09 ENCOUNTER — Inpatient Hospital Stay
Admission: EM | Admit: 2019-09-09 | Discharge: 2019-09-11 | DRG: 564 | Disposition: A | Discharge status: Other Acute Inpatient Hospital | Payer: No Typology Code available for payment source | Attending: Internal Medicine | Admitting: Internal Medicine

## 2019-09-09 ENCOUNTER — Other Ambulatory Visit: Payer: Self-pay

## 2019-09-09 ENCOUNTER — Inpatient Hospital Stay: Payer: No Typology Code available for payment source

## 2019-09-09 DIAGNOSIS — N186 End stage renal disease: Secondary | ICD-10-CM | POA: Diagnosis present

## 2019-09-09 DIAGNOSIS — I480 Paroxysmal atrial fibrillation: Secondary | ICD-10-CM | POA: Diagnosis present

## 2019-09-09 DIAGNOSIS — Z79899 Other long term (current) drug therapy: Secondary | ICD-10-CM | POA: Diagnosis not present

## 2019-09-09 DIAGNOSIS — R197 Diarrhea, unspecified: Secondary | ICD-10-CM | POA: Diagnosis present

## 2019-09-09 DIAGNOSIS — Y835 Amputation of limb(s) as the cause of abnormal reaction of the patient, or of later complication, without mention of misadventure at the time of the procedure: Secondary | ICD-10-CM | POA: Diagnosis present

## 2019-09-09 DIAGNOSIS — Z89511 Acquired absence of right leg below knee: Secondary | ICD-10-CM | POA: Diagnosis not present

## 2019-09-09 DIAGNOSIS — Z590 Homelessness: Secondary | ICD-10-CM

## 2019-09-09 DIAGNOSIS — Z96643 Presence of artificial hip joint, bilateral: Secondary | ICD-10-CM | POA: Diagnosis present

## 2019-09-09 DIAGNOSIS — Z7901 Long term (current) use of anticoagulants: Secondary | ICD-10-CM

## 2019-09-09 DIAGNOSIS — E872 Acidosis: Secondary | ICD-10-CM | POA: Diagnosis present

## 2019-09-09 DIAGNOSIS — Z7982 Long term (current) use of aspirin: Secondary | ICD-10-CM | POA: Diagnosis not present

## 2019-09-09 DIAGNOSIS — A419 Sepsis, unspecified organism: Secondary | ICD-10-CM | POA: Diagnosis present

## 2019-09-09 DIAGNOSIS — T8744 Infection of amputation stump, left lower extremity: Principal | ICD-10-CM | POA: Diagnosis present

## 2019-09-09 DIAGNOSIS — Z89512 Acquired absence of left leg below knee: Secondary | ICD-10-CM

## 2019-09-09 DIAGNOSIS — W06XXXA Fall from bed, initial encounter: Secondary | ICD-10-CM

## 2019-09-09 DIAGNOSIS — E1122 Type 2 diabetes mellitus with diabetic chronic kidney disease: Secondary | ICD-10-CM | POA: Diagnosis present

## 2019-09-09 DIAGNOSIS — T8789 Other complications of amputation stump: Secondary | ICD-10-CM | POA: Diagnosis not present

## 2019-09-09 DIAGNOSIS — F1120 Opioid dependence, uncomplicated: Secondary | ICD-10-CM | POA: Diagnosis present

## 2019-09-09 DIAGNOSIS — D631 Anemia in chronic kidney disease: Secondary | ICD-10-CM | POA: Diagnosis present

## 2019-09-09 DIAGNOSIS — G8929 Other chronic pain: Secondary | ICD-10-CM | POA: Diagnosis not present

## 2019-09-09 DIAGNOSIS — N2581 Secondary hyperparathyroidism of renal origin: Secondary | ICD-10-CM | POA: Diagnosis present

## 2019-09-09 DIAGNOSIS — R609 Edema, unspecified: Secondary | ICD-10-CM

## 2019-09-09 DIAGNOSIS — Z992 Dependence on renal dialysis: Secondary | ICD-10-CM | POA: Diagnosis not present

## 2019-09-09 DIAGNOSIS — Z20822 Contact with and (suspected) exposure to covid-19: Secondary | ICD-10-CM | POA: Diagnosis present

## 2019-09-09 DIAGNOSIS — E118 Type 2 diabetes mellitus with unspecified complications: Secondary | ICD-10-CM

## 2019-09-09 DIAGNOSIS — E119 Type 2 diabetes mellitus without complications: Secondary | ICD-10-CM

## 2019-09-09 LAB — URINALYSIS, COMPLETE (UACMP) WITH MICROSCOPIC
Bacteria, UA: NONE SEEN
Bilirubin Urine: NEGATIVE
Glucose, UA: >=500 mg/dL — AB
Hgb urine dipstick: NEGATIVE
Ketones, ur: NEGATIVE mg/dL
Leukocytes,Ua: NEGATIVE
Nitrite: NEGATIVE
Protein, ur: 100 mg/dL — AB
Specific Gravity, Urine: 1.007 (ref 1.005–1.030)
pH: 9 — ABNORMAL HIGH (ref 5.0–8.0)

## 2019-09-09 LAB — CBC
HCT: 24 % — ABNORMAL LOW (ref 39.0–52.0)
Hemoglobin: 7.8 g/dL — ABNORMAL LOW (ref 13.0–17.0)
MCH: 31.2 pg (ref 26.0–34.0)
MCHC: 32.5 g/dL (ref 30.0–36.0)
MCV: 96 fL (ref 80.0–100.0)
Platelets: 75 10*3/uL — ABNORMAL LOW (ref 150–400)
RBC: 2.5 MIL/uL — ABNORMAL LOW (ref 4.22–5.81)
RDW: 17.8 % — ABNORMAL HIGH (ref 11.5–15.5)
WBC: 8.8 10*3/uL (ref 4.0–10.5)
nRBC: 0 % (ref 0.0–0.2)

## 2019-09-09 LAB — BLOOD GAS, ARTERIAL
Acid-base deficit: 0.9 mmol/L (ref 0.0–2.0)
Bicarbonate: 23.2 mmol/L (ref 20.0–28.0)
FIO2: 0.32
O2 Saturation: 99.3 %
Patient temperature: 37
pCO2 arterial: 35 mmHg (ref 32.0–48.0)
pH, Arterial: 7.43 (ref 7.350–7.450)
pO2, Arterial: 148 mmHg — ABNORMAL HIGH (ref 83.0–108.0)

## 2019-09-09 LAB — CBC WITH DIFFERENTIAL/PLATELET
Abs Immature Granulocytes: 0.15 10*3/uL — ABNORMAL HIGH (ref 0.00–0.07)
Basophils Absolute: 0 10*3/uL (ref 0.0–0.1)
Basophils Relative: 0 %
Eosinophils Absolute: 0 10*3/uL (ref 0.0–0.5)
Eosinophils Relative: 0 %
HCT: 27.5 % — ABNORMAL LOW (ref 39.0–52.0)
Hemoglobin: 9.2 g/dL — ABNORMAL LOW (ref 13.0–17.0)
Immature Granulocytes: 1 %
Lymphocytes Relative: 11 %
Lymphs Abs: 1.5 10*3/uL (ref 0.7–4.0)
MCH: 30.9 pg (ref 26.0–34.0)
MCHC: 33.5 g/dL (ref 30.0–36.0)
MCV: 92.3 fL (ref 80.0–100.0)
Monocytes Absolute: 0.8 10*3/uL (ref 0.1–1.0)
Monocytes Relative: 6 %
Neutro Abs: 11.3 10*3/uL — ABNORMAL HIGH (ref 1.7–7.7)
Neutrophils Relative %: 82 %
Platelets: 92 10*3/uL — ABNORMAL LOW (ref 150–400)
RBC: 2.98 MIL/uL — ABNORMAL LOW (ref 4.22–5.81)
RDW: 17.8 % — ABNORMAL HIGH (ref 11.5–15.5)
Smear Review: NORMAL
WBC: 13.7 10*3/uL — ABNORMAL HIGH (ref 4.0–10.5)
nRBC: 0 % (ref 0.0–0.2)

## 2019-09-09 LAB — COMPREHENSIVE METABOLIC PANEL
ALT: 14 U/L (ref 0–44)
AST: 23 U/L (ref 15–41)
Albumin: 3.7 g/dL (ref 3.5–5.0)
Alkaline Phosphatase: 48 U/L (ref 38–126)
Anion gap: 17 — ABNORMAL HIGH (ref 5–15)
BUN: 50 mg/dL — ABNORMAL HIGH (ref 8–23)
CO2: 24 mmol/L (ref 22–32)
Calcium: 9.2 mg/dL (ref 8.9–10.3)
Chloride: 95 mmol/L — ABNORMAL LOW (ref 98–111)
Creatinine, Ser: 6.7 mg/dL — ABNORMAL HIGH (ref 0.61–1.24)
GFR calc Af Amer: 9 mL/min — ABNORMAL LOW (ref >60)
GFR calc non Af Amer: 7 mL/min — ABNORMAL LOW (ref >60)
Glucose, Bld: 145 mg/dL — ABNORMAL HIGH (ref 70–99)
Potassium: 4.8 mmol/L (ref 3.5–5.1)
Sodium: 136 mmol/L (ref 135–145)
Total Bilirubin: 3.2 mg/dL — ABNORMAL HIGH (ref 0.3–1.2)
Total Protein: 7.2 g/dL (ref 6.5–8.1)

## 2019-09-09 LAB — MAGNESIUM: Magnesium: 1.6 mg/dL — ABNORMAL LOW (ref 1.7–2.4)

## 2019-09-09 LAB — URINE DRUG SCREEN, QUALITATIVE (ARMC ONLY)
Amphetamines, Ur Screen: NOT DETECTED
Barbiturates, Ur Screen: NOT DETECTED
Benzodiazepine, Ur Scrn: NOT DETECTED
Cannabinoid 50 Ng, Ur ~~LOC~~: NOT DETECTED
Cocaine Metabolite,Ur ~~LOC~~: NOT DETECTED
MDMA (Ecstasy)Ur Screen: NOT DETECTED
Methadone Scn, Ur: NOT DETECTED
Opiate, Ur Screen: NOT DETECTED
Phencyclidine (PCP) Ur S: NOT DETECTED
Tricyclic, Ur Screen: NOT DETECTED

## 2019-09-09 LAB — PROTIME-INR
INR: 2.1 — ABNORMAL HIGH (ref 0.8–1.2)
Prothrombin Time: 22.9 seconds — ABNORMAL HIGH (ref 11.4–15.2)

## 2019-09-09 LAB — LACTIC ACID, PLASMA
Lactic Acid, Venous: 2.3 mmol/L (ref 0.5–1.9)
Lactic Acid, Venous: 2.2 mmol/L (ref 0.5–1.9)

## 2019-09-09 LAB — SARS CORONAVIRUS 2 BY RT PCR (HOSPITAL ORDER, PERFORMED IN ~~LOC~~ HOSPITAL LAB): SARS Coronavirus 2: NEGATIVE

## 2019-09-09 MED ORDER — HYPROMELLOSE (GONIOSCOPIC) 2.5 % OP SOLN
1.0000 [drp] | Freq: Three times a day (TID) | OPHTHALMIC | Status: DC | PRN
  Filled 2019-09-09: qty 15

## 2019-09-09 MED ORDER — MIDODRINE HCL 5 MG PO TABS
5.0000 mg | ORAL_TABLET | Freq: Three times a day (TID) | ORAL | Status: DC
  Administered 2019-09-10 (×4): 5 mg via ORAL
  Filled 2019-09-09 (×5): qty 1

## 2019-09-09 MED ORDER — OXYCODONE HCL 5 MG PO TABS
10.0000 mg | ORAL_TABLET | ORAL | Status: AC
  Administered 2019-09-09: 10 mg via ORAL
  Filled 2019-09-09: qty 2

## 2019-09-09 MED ORDER — HEPARIN SODIUM (PORCINE) 5000 UNIT/ML IJ SOLN
5000.0000 [IU] | Freq: Three times a day (TID) | INTRAMUSCULAR | Status: DC
  Administered 2019-09-09 – 2019-09-10 (×2): 5000 [IU] via SUBCUTANEOUS
  Filled 2019-09-09 (×2): qty 1

## 2019-09-09 MED ORDER — MAGNESIUM SULFATE 2 GM/50ML IV SOLN
2.0000 g | Freq: Once | INTRAVENOUS | Status: AC
  Administered 2019-09-10: 2 g via INTRAVENOUS
  Filled 2019-09-09: qty 50

## 2019-09-09 MED ORDER — SODIUM CHLORIDE 0.9 % IV BOLUS
500.0000 mL | Freq: Once | INTRAVENOUS | Status: AC
  Administered 2019-09-09: 500 mL via INTRAVENOUS

## 2019-09-09 MED ORDER — ASPIRIN EC 81 MG PO TBEC
81.0000 mg | DELAYED_RELEASE_TABLET | Freq: Every day | ORAL | Status: DC
  Administered 2019-09-10: 81 mg via ORAL
  Filled 2019-09-09: qty 1

## 2019-09-09 MED ORDER — ACETAMINOPHEN 500 MG PO TABS
1000.0000 mg | ORAL_TABLET | Freq: Once | ORAL | Status: AC
  Administered 2019-09-09: 1000 mg via ORAL
  Filled 2019-09-09: qty 2

## 2019-09-09 MED ORDER — LOPERAMIDE HCL 2 MG PO TABS: 4.0000 mg | ORAL_TABLET | Freq: Four times a day (QID) | ORAL | 0 refills | Status: DC | PRN

## 2019-09-09 MED ORDER — SODIUM CHLORIDE 0.9 % IV SOLN
2.0000 g | Freq: Once | INTRAVENOUS | Status: AC
  Administered 2019-09-09: 2 g via INTRAVENOUS
  Filled 2019-09-09: qty 2

## 2019-09-09 MED ORDER — ACETAMINOPHEN 325 MG PO TABS
650.0000 mg | ORAL_TABLET | Freq: Four times a day (QID) | ORAL | Status: DC | PRN
  Administered 2019-09-10: 650 mg via ORAL
  Filled 2019-09-09: qty 2

## 2019-09-09 MED ORDER — ACETAMINOPHEN 500 MG PO TABS: 1000.0000 mg | ORAL_TABLET | Freq: Three times a day (TID) | ORAL | Status: DC | PRN

## 2019-09-09 MED ORDER — ACETAMINOPHEN 650 MG RE SUPP: 650.0000 mg | Freq: Four times a day (QID) | RECTAL | Status: DC | PRN

## 2019-09-09 MED ORDER — PREGABALIN 25 MG PO CAPS
25.0000 mg | ORAL_CAPSULE | Freq: Every day | ORAL | Status: DC
  Administered 2019-09-10: 25 mg via ORAL
  Filled 2019-09-09 (×2): qty 1

## 2019-09-09 MED ORDER — CALCITRIOL 0.25 MCG PO CAPS
0.2500 ug | ORAL_CAPSULE | Freq: Every day | ORAL | Status: DC
  Administered 2019-09-09 – 2019-09-10 (×2): 0.25 ug via ORAL
  Filled 2019-09-09 (×4): qty 1

## 2019-09-09 MED ORDER — MIDODRINE HCL 5 MG PO TABS
2.5000 mg | ORAL_TABLET | Freq: Three times a day (TID) | ORAL | Status: DC
  Filled 2019-09-09 (×2): qty 0.5

## 2019-09-09 MED ORDER — VANCOMYCIN HCL IN DEXTROSE 1-5 GM/200ML-% IV SOLN
1000.0000 mg | Freq: Once | INTRAVENOUS | Status: AC
  Administered 2019-09-09: 1000 mg via INTRAVENOUS
  Filled 2019-09-09: qty 200

## 2019-09-09 MED ORDER — ROPINIROLE HCL 1 MG PO TABS
1.0000 mg | ORAL_TABLET | Freq: Every day | ORAL | Status: DC
  Administered 2019-09-09 – 2019-09-10 (×2): 1 mg via ORAL
  Filled 2019-09-09 (×2): qty 1

## 2019-09-09 MED ORDER — PREGABALIN 25 MG PO CAPS
25.0000 mg | ORAL_CAPSULE | ORAL | Status: AC
  Administered 2019-09-09: 25 mg via ORAL

## 2019-09-09 MED ORDER — METRONIDAZOLE IN NACL 5-0.79 MG/ML-% IV SOLN
500.0000 mg | Freq: Once | INTRAVENOUS | Status: AC
  Administered 2019-09-09: 500 mg via INTRAVENOUS
  Filled 2019-09-09: qty 100

## 2019-09-09 MED ORDER — VANCOMYCIN HCL 750 MG/150ML IV SOLN
750.0000 mg | Freq: Once | INTRAVENOUS | Status: AC
  Administered 2019-09-09: 750 mg via INTRAVENOUS
  Filled 2019-09-09: qty 150

## 2019-09-09 MED ORDER — ALBUMIN HUMAN 25 % IV SOLN
25.0000 g | Freq: Once | INTRAVENOUS | Status: AC
  Administered 2019-09-09: 25 g via INTRAVENOUS
  Filled 2019-09-09: qty 100

## 2019-09-09 MED ORDER — ATORVASTATIN CALCIUM 20 MG PO TABS
20.0000 mg | ORAL_TABLET | Freq: Every day | ORAL | Status: DC
  Administered 2019-09-09 – 2019-09-10 (×2): 20 mg via ORAL
  Filled 2019-09-09 (×2): qty 1

## 2019-09-09 MED ORDER — FUROSEMIDE 20 MG PO TABS
10.0000 mg | ORAL_TABLET | Freq: Every day | ORAL | Status: DC
  Administered 2019-09-10: 10 mg via ORAL
  Filled 2019-09-09: qty 0.5
  Filled 2019-09-09: qty 1
  Filled 2019-09-09: qty 0.5

## 2019-09-09 NOTE — ED Provider Notes (Addendum)
EKG is reviewed inter by me at 1720 Heart rate 80 QRs 100 QTc 480 Atrial fibrillation, low amplitude.  No evidence acute ischemia denoted   Delman Kitten, MD 09/09/19 1722   Consult placed and acknowledged by Dr. Candiss Norse of hematology.  Additionally, consult placed with vascular surgery Dr. Trula Slade who advises team will see him during his hospital stay to evaluate his stump site, though feels this would be low risk based on description for possible source in the stump as well as a history  Admission discussed with Dr. Benny Lennert hospitalist service    Delman Kitten, MD 09/09/19 1723

## 2019-09-09 NOTE — H&P (Signed)
Antonio Phillips is an 74 y.o. male.   Chief Complaint: [facility] out of bed, ESRD on HD, Diarrhea x 3d HPI: The patient is a 74 yr old homeless man who lives in a hotel. He states that he has been feeling bad for 3 days. He carries a medical history significant for ESRD on HD, B/l BKA, DM II, PAF. He fell out of the bed this am and was unable to get up off of the floor. He fell on his knee which is somewhat blistered due to a malfitting prosthesis. EMS had been called to his motel room where he is residing twice yesterday. The patient notably complains of having run out of his Lyrica and oxycodone for several days. However, PDMP has demonstrated that the patient's prescription of 104 oxycodone 5 mg had been filled on 08/07/2019 and 09/02/2019.  The patient denies fevers or chills, no shortness of breath or cough, No sore throat or rhinorrhea. He has had diarrhea, abdominal pain, or vomiting. He is complaining of pain in his left stump.  In the ED he was found to have a BP initially of 123/54. However, by this afternoon blood pressure had dropped into the seventies. Lactic acid was 2.3. WBC was 2.3. Stool for C Diff. UCx has been sent. Blood cultures x 2 will be obtained.   [facility] hospitalists has been consulted to admit the patient for further evaluation and care. Nephrology has been consulted. The patient has been placed on a sepsis protocol.   Past Medical History:  Diagnosis Date  . Atrial fibrillation (Antonio Phillips)   . Diabetes (Antonio Phillips)    Pt states that he has no hx of diabetes  . ESRD (end stage renal disease) [facility])     Past Surgical History:  Procedure Laterality Date  . AV FISTULA REPAIR    . BELOW KNEE LEG AMPUTATION Bilateral   . CHOLECYSTECTOMY    . TOTAL HIP ARTHROPLASTY Bilateral     History reviewed. No pertinent family history. Social History:  reports that he has never smoked. He has never used smokeless tobacco. He reports that he does not drink alcohol and does not use drugs. (Not in a  hospital admission)   Allergies: No Known Allergies  Pertinent items noted in HPI and remainder of comprehensive ROS otherwise negative.   General appearance: alert, cooperative and mild distress Head: Normocephalic, without obvious abnormality, atraumatic Eyes: conjunctivae/corneas clear. PERRL, EOM's intact. Fundi benign. Throat: lips, mucosa, and tongue normal; teeth and gums normal Neck: no adenopathy, no carotid bruit, no JVD, supple, symmetrical, trachea midline and thyroid not enlarged, symmetric, no tenderness/mass/nodules Resp: No increased work of breathing. No wheezes, rales, or rhonchi. No tactile fremitus. Chest wall: no tenderness Cardio: regular rate and rhythm, S1, S2 normal, no murmur, click, rub or gallop GI: soft, non-tender; bowel sounds normal; no masses,  no organomegaly Extremities: Bilateral BKA stumps. Left stump is ecchymotic with a skin tear and erythema. It is painful to touch. Pulses: 2+ and symmetric Skin: Skin color, texture, turgor normal. No rashes or lesions or with exception for skin tear and ecchymosis of left knee. Lymph nodes: Cervical, supraclavicular, and axillary nodes normal. Neurologic: Grossly normal   Results for orders placed or performed during the hospital encounter of 09/09/19 (from the past 48 hour(s))  Comprehensive metabolic panel     Status: Abnormal   Collection Time: 09/09/19  8:39 AM  Result Value Ref Range   Sodium 136 135 - 145 mmol/L   Potassium 4.8 3.5 -  5.1 mmol/L   Chloride 95 (L) 98 - 111 mmol/L   CO2 24 22 - 32 mmol/L   Glucose, Bld 145 (H) 70 - 99 mg/dL    Comment: Glucose reference range applies only to samples taken after fasting for at least 8 hours.   BUN 50 (H) 8 - 23 mg/dL   Creatinine, Ser 6.70 (H) 0.61 - 1.24 mg/dL   Calcium 9.2 8.9 - 10.3 mg/dL   Total Protein 7.2 6.5 - 8.1 g/dL   Albumin 3.7 3.5 - 5.0 g/dL   AST 23 15 - 41 U/L   ALT 14 0 - 44 U/L   Alkaline Phosphatase 48 38 - 126 U/L   Total Bilirubin  3.2 (H) 0.3 - 1.2 mg/dL   GFR calc non Af Amer 7 (L) >60 mL/min   GFR calc Af Amer 9 (L) >60 mL/min   Anion gap 17 (H) 5 - 15    Comment: Performed at [facility], [address] [place], [place] [address]  CBC with Differential     Status: Abnormal   Collection Time: 09/09/19  8:39 AM  Result Value Ref Range   WBC 13.7 (H) 4.0 - 10.5 K/uL   RBC 2.98 (L) 4.22 - 5.81 MIL/uL   Hemoglobin 9.2 (L) 13.0 - 17.0 g/dL   HCT 27.5 (L) 39 - 52 %   MCV 92.3 80.0 - 100.0 fL   MCH 30.9 26.0 - 34.0 pg   MCHC 33.5 30.0 - 36.0 g/dL   RDW 17.8 (H) 11.5 - 15.5 %   Platelets 92 (L) 150 - 400 K/uL    Comment: Immature Platelet Fraction may be clinically indicated, consider ordering this additional test [id number]    nRBC 0.0 0.0 - 0.2 %   Neutrophils Relative % 82 %   Neutro Abs 11.3 (H) 1.7 - 7.7 K/uL   Lymphocytes Relative 11 %   Lymphs Abs 1.5 0.7 - 4.0 K/uL   Monocytes Relative 6 %   Monocytes Absolute 0.8 0 - 1 K/uL   Eosinophils Relative 0 %   Eosinophils Absolute 0.0 0 - 0 K/uL   Basophils Relative 0 %   Basophils Absolute 0.0 0 - 0 K/uL   WBC Morphology VACUOLATED NEUTROPHILS    RBC Morphology MIXED RBC POPULATION    Smear Review Normal platelet morphology    Immature Granulocytes 1 %   Abs Immature Granulocytes 0.15 (H) 0.00 - 0.07 K/uL    Comment: Performed at [facility], [address] [place], [place] [address]  Protime-INR     Status: Abnormal   Collection Time: 09/09/19  8:39 AM  Result Value Ref Range   Prothrombin Time 22.9 (H) 11.4 - 15.2 seconds   INR 2.1 (H) 0.8 - 1.2    Comment: (NOTE) INR goal varies based on device and disease states. Performed at [facility], [address] [place], [place] [address]   Urinalysis, Complete w Microscopic     Status: Abnormal   Collection Time: 09/09/19  4:54 PM  Result Value Ref Range   Color, Urine YELLOW (A) YELLOW   APPearance HAZY (A) CLEAR   Specific Gravity, Urine 1.007 1.005 - 1.030    pH 9.0 (H) 5.0 - 8.0   Glucose, UA >=500 (A) NEGATIVE mg/dL   Hgb urine dipstick NEGATIVE NEGATIVE   Bilirubin Urine NEGATIVE NEGATIVE   Ketones, ur NEGATIVE NEGATIVE mg/dL   Protein, ur 100 (A) NEGATIVE mg/dL   Nitrite NEGATIVE NEGATIVE   Leukocytes,Ua NEGATIVE  NEGATIVE   RBC / HPF 0-5 0 - 5 RBC/hpf   WBC, UA 0-5 0 - 5 WBC/hpf   Bacteria, UA NONE SEEN NONE SEEN   Squamous Epithelial / LPF 6-10 0 - 5    Comment: Performed at [facility], [address] [place], [place] [address]   @[email or web address] @  Blood pressure (!) 100/50, pulse 60, temperature 98 F (36.7 C), temperature source Oral, resp. rate 16, height 5\' 11"  (1.803 m), weight 81.6 kg, SpO2 99 %.   Assessment/Plan Sepsis with lactic acidosis, hypotension, leukocytosis, and generalized weakness. No obvious source of infection. Concern for infection of left stump given appearance. The patient has been started on cefepime and vancomyin. Blood cultures x 2 and urine cultures have been obtained.  ESRD on HD: Nephrology has been consulted. The patient is on lasix daily at home. Will hold for now given the patient's hypotension.  Diarrhea: DDx: Stool has been sent for C Diff. Diarrhea may also be due to opiate withdrawal. He has been started on IV flagyl. Will make oxycodone available.  DM II: Pt denies history of diabetes. Will check [id number]  Metabolic acidosis: HAGMA at AG of 17. Due to lactic acid. Sepsis protocol in place.  I have seen and examined this patient myself. I have spent 78 minutes in his evaluation and care.  DVT Prophylaxis: Heparin CODE STATUS: Full Code Family Communication: None available Disposition: Patient is from hotel, homeless. Anticipate discharge to same. Barriers to discharge include acute illness. Need for continued work up and treatment.  Status is: Inpatient  Remains inpatient appropriate because:Inpatient level of care appropriate due to severity of illness   Dispo: The patient is from:  Home              Anticipated d/c is to: Home              Anticipated d/c date is: 3 days              Patient currently is not medically stable to d/c.  Shanvi Moyd 09/09/2019, 5:48 PM

## 2019-09-09 NOTE — ED Triage Notes (Signed)
Pt to ED via [facility] from [facility]. Per EMS pt fell out of bed and was approximately on the floor for 101mins. 3rd party called EMS due to hearing screams from pt's room.   Upon arrival pt in NAD. VSS. Pt stating he defecated on himself and was trying to get out of bed but slipped. Pt bilateral BKA. Pt stating back pain and knee pain. Pt denies LOC or hitting his head.

## 2019-09-09 NOTE — ED Provider Notes (Signed)
Received signout on the patient at 5:45 PM.  Previously managed by Dr. Joni Fears  He advises the patient came for a fall, minor.  Previous BKA and end-stage renal disease.  Patient has been having several episodes of diarrhea the last 3 days.  Patient continued to have diarrhea here.  Will send for culture.  Given oxycodone and Lyrica here which she reports about 2 weeks ago  He also reports that his stump site has been somewhat tender after a previous fall and he had a blister that developed and now has developed a lot of "bruising"  He is awake and alert.  He is hypotensive blood pressure 78/38.  Fully alert in no distress.  Capillary refill is normal.  Is left stump site appears to have a purpuric appearance as well.    Delman Kitten, MD 09/09/19 [phone]

## 2019-09-09 NOTE — ED Notes (Addendum)
Pt can be heard yelling from nurses station,  "Help, help me." This RN at bedside and asked pt what was wrong. Pt stating, "I am in pain and he hasn't given me anything." This RN assuring pt he just received oxycodone and tylenol for pain. Pt stating, "that's not fucking helping the problem." This RN asking pt what is the problem that has no been resolved. Pt stating, "It's my fucking leg, I need my lyrica." This RN telling pt that RN will have to consult with the MD but RN cannot place an order for that. Pt stating, "Give me my damn lyrica now." This RN stating to pt it is inappropriate to talk to this RN this way when we are here to help. Pt stating, "I don't care."

## 2019-09-09 NOTE — ED Notes (Signed)
[facility] [place] aware of pt's blood pressure. See order for 536ml NS bolus.

## 2019-09-09 NOTE — ED Notes (Signed)
Pt transported to xray 

## 2019-09-09 NOTE — ED Notes (Signed)
NP Randol Kern contacted about pt's pressure being 93/46 after albumin infusion.

## 2019-09-09 NOTE — ED Provider Notes (Addendum)
ED Sepsis - Repeat Assessment   Performed at:    ----------------------------------------- 6:00 PM on 09/09/2019 -----------------------------------------       Blood pressure (!) 100/50, pulse 60, temperature 98   Patient remains alert and oriented.  Appears well perfused.  Awaits admission to hospitalist service.  Patient's blood pressure improving.  Appears well perfused.  Received multiple fluid boluses at this time, will hold additional fluid resuscitation at this time given the patient is an end-stage renal disease patient on hemodialysis, do not wish to volume overload.  Responsive to fluids at this time   Delman Kitten, MD 09/09/19 1800    Delman Kitten, MD 09/09/19 1801

## 2019-09-09 NOTE — ED Provider Notes (Signed)
Patient calling out, reports he developed shortness of breath.  He is found to be saturating 90% on room air.  Reports this happens to him from time to time.  Placed on 2 L nasal cannula with improvement in symptoms and oxygen saturation 95%.  I have updated, sent message to Sharion Settler cross cover for hospitalist service as well as Dr. Benny Lennert and Dr. Candiss Norse of nephrology to notify them.  Repeat chest x-ray has been ordered, he has received significant fluid resuscitation and I question if this could be potentially volume overload on his differential diagnosis.  Patient is currently admitted on the hospitalist service.  DG Chest Portable 1 View  Result Date: 09/09/2019 CLINICAL DATA:  Dyspnea. EXAM: PORTABLE CHEST 1 VIEW COMPARISON:  September 09, 2019 (12:25 p.m.) FINDINGS: Mild atelectasis is seen within the mid left lung and right lung base. There is no evidence of acute infiltrate, pleural effusion or pneumothorax. The cardiac silhouette is markedly enlarged and unchanged in size. Degenerative changes seen throughout the thoracic spine. IMPRESSION: Stable cardiomegaly with mild atelectasis within the mid left lung and right lung base. Electronically Signed   By: Virgina Norfolk M.D.   On: 09/09/2019 22:42    Repeat chest x-ray reviewed.  Cardiomegaly.  Atelectasis suspected.  No florid evidence of CHF developing  Sharion Settler nurse practitioner from hospitalist as seen and evaluated the patient in the ER as well regarding status change.  Ongoing care in the ED signed out to Dr. Quentin Cornwall, patient currently admitted to the hospitalist service awaiting admission.      Delman Kitten, MD 09/09/19 2333

## 2019-09-09 NOTE — ED Notes (Signed)
Pt changed by this RN and Caitlyn EDT

## 2019-09-09 NOTE — ED Notes (Signed)
This RN at bedside to give pt his Lyrica. Pt stating, "About damn time." This RN asked pt to stop using foul language and being verbally abusive each encounter this RN has with him. Pt stating, "just like a woman to say that shit." Pt then continues to state, "clean this shit off of me." This RN stating multiple attempts to come in and clean pt but pt yelling and being verbally abusive. Pt stating, "then I want a man to do it."

## 2019-09-09 NOTE — ED Notes (Signed)
Pt incontinent of dried stool. Pt cleaned, clean brief and clean gown placed on pt. Pt given warm blankets and was repositioned in bed. Side rails up for safety. Call light within reach. Pt has no further needs at this time.

## 2019-09-09 NOTE — Consult Note (Signed)
PHARMACY -  BRIEF ANTIBIOTIC NOTE   Pharmacy has received consult(s) for Vancomycin/Cefepime from an ED provider.  The patient's profile has been reviewed for ht/wt/allergies/indication/available labs.    One time order(s) placed for Cefepime 2g IV x 1 and Vancomycin 1000mg  + 750mg  IV for a total loading dose of 1750mg   Further antibiotics/pharmacy consults should be ordered by admitting physician if indicated.                       Thank you,  Lu Duffel, PharmD, BCPS Clinical Pharmacist 09/09/2019 5:12 PM

## 2019-09-09 NOTE — ED Notes (Signed)
Pt yelling, "I need my lyrica." Pt visualized and is on his phone. Pt continues to yell at this time and can be heard from nurses station. This RN still waiting for pharmacy to bring medicine to the ED at this time.

## 2019-09-09 NOTE — Consult Note (Signed)
CODE SEPSIS - PHARMACY COMMUNICATION  **Broad Spectrum Antibiotics should be administered within 1 hour of Sepsis diagnosis**  Time Code Sepsis Called/Page Received: 1710  Antibiotics Ordered: Cefepime/Vancomycin  Time of 1st antibiotic administration: 1758  Additional action taken by pharmacy: none  If necessary, Name of Provider/Nurse Contacted: [place] ,PharmD Clinical Pharmacist  09/09/2019  5:13 PM

## 2019-09-09 NOTE — ED Notes (Signed)
Pt assisted with urinal

## 2019-09-09 NOTE — ED Provider Notes (Addendum)
[facility] Emergency Department Provider Note  ____________________________________________  Time seen: Approximately 8:40 AM  I have reviewed the triage vital signs and the nursing notes.   HISTORY  Chief Complaint Fall, left knee pain   HPI Antonio Phillips is a 74 y.o. male with a history of atrial fibrillation diabetes and end-stage renal disease on hemodialysis who comes the ED complaining of a fall.  He reports that he has been out of his oxycodone and Lyrica for the past several days, and this is associated with a feeling of generalized weakness and diarrhea for the past 3 days.  This morning, while try to get out of bed he fell onto his left knee.  Denies head trauma or loss of consciousness.  No chest pain shortness of breath palpitations dizziness.  EMS noted they were called to his room at a local [facility] where he is residing 2 times yesterday, but patient refused transport both times.   PDMP reviewed, patient had prescription filled of 105 oxycodone 5 mg on August 07, 2019 and September 02, 2019.     Past Medical History:  Diagnosis Date  . Atrial fibrillation ([place])   . Diabetes ([place])    Pt states that he has no hx of diabetes  . ESRD (end stage renal disease) [facility])      Patient Active Problem List   Diagnosis Date Noted  . Hyperkalemia 04/25/2018  . UTI (urinary tract infection) 04/25/2018  . Diabetes ([place]) 04/25/2018  . ESRD on dialysis [facility]) 04/25/2018     Past Surgical History:  Procedure Laterality Date  . AV FISTULA REPAIR    . BELOW KNEE LEG AMPUTATION Bilateral   . CHOLECYSTECTOMY    . TOTAL HIP ARTHROPLASTY Bilateral      Prior to Admission medications   Medication Sig Start Date End Date Taking? Authorizing Provider  acetaminophen (TYLENOL) 500 MG tablet Take 1,000 mg by mouth every 8 (eight) hours as needed. 11/18/17   [provider]  aspirin EC 81 MG tablet Take 81 mg by mouth daily.    [provider]   atorvastatin (LIPITOR) 20 MG tablet Take 20 mg by mouth daily. 03/13/19   [provider]  calcitRIOL (ROCALTROL) 0.25 MCG capsule Take 0.25 mcg by mouth daily.    [provider]  carboxymethylcellulose (REFRESH PLUS) 0.5 % SOLN Apply 1 drop to eye every 8 (eight) hours as needed. 02/19/18   [provider]  finasteride (PROSCAR) 5 MG tablet Take 5 mg by mouth daily. 03/13/19   [provider]  furosemide (LASIX) 20 MG tablet Take 10-20 mg by mouth daily. Non-dialysis days    [provider]  loperamide (IMODIUM A-D) 2 MG tablet Take 2 tablets (4 mg total) by mouth 4 (four) times daily as needed for diarrhea or loose stools. 09/09/19   Carrie Mew, MD  metoprolol tartrate (LOPRESSOR) 25 MG tablet Take 25 mg by mouth 2 (two) times daily.    [provider]  midodrine (PROAMATINE) 2.5 MG tablet Take 2.5 mg by mouth 3 (three) times daily with meals.    [provider]  pregabalin (LYRICA) 25 MG capsule Take 25 mg by mouth Nightly. 11/02/17   [provider]  rOPINIRole (REQUIP) 1 MG tablet Take 1 mg by mouth at bedtime. 03/13/19   [provider]  warfarin (COUMADIN) 2.5 MG tablet Take 2.5 mg by mouth daily. Take 1 tablet by mouth sat/t/th, take 2 tablets m/w/f/sun 11/18/17 11/16/18  [provider]     Allergies Patient has no known allergies.   History reviewed. No pertinent family history.  Social History Social History   Tobacco Use  . Smoking status: Never Smoker  . Smokeless tobacco: Never Used  Substance Use Topics  . Alcohol use: Never  . Drug use: Never    Review of Systems  Constitutional:   No fever or chills.  ENT:   No sore throat. No rhinorrhea. Cardiovascular:   No chest pain or syncope. Respiratory:   No dyspnea or cough. Gastrointestinal:   Negative for abdominal pain or vomiting.  Positive diarrhea.  Musculoskeletal:   Positive left knee pain All other systems reviewed and are  negative except as documented above in ROS and HPI.  ____________________________________________   PHYSICAL EXAM:  VITAL SIGNS: ED Triage Vitals  Enc Vitals Group     BP      Pulse      Resp      Temp      Temp src      SpO2      Weight      Height      Head Circumference      Peak Flow      Pain Score      Pain Loc      Pain Edu?      Excl. in [place]?     Vital signs reviewed, nursing assessments reviewed.   Constitutional:   Alert and oriented. Non-toxic appearance. Eyes:   Conjunctivae are normal. EOMI. PERRL. ENT      Head:   Normocephalic and atraumatic.      Nose: Normal      Mouth/Throat:   Dry mucous membranes.      Neck:   No meningismus. Full ROM. Hematological/Lymphatic/Immunilogical:   No cervical or inguinal lymphadenopathy. Cardiovascular:   RRR. Symmetric bilateral radial and DP pulses.  No murmurs. Cap refill less than 2 seconds. Respiratory:   Normal respiratory effort without tachypnea/retractions. Breath sounds are clear and equal bilaterally. No wheezes/rales/rhonchi. Gastrointestinal:   Soft and nontender. Non distended. There is no CVA tenderness.  No rebound, rigidity, or guarding. Genitourinary:   Normal Musculoskeletal: Status post bilateral BKA.  Chronic discoloration of left stump with 3 cm area of superficial distal skin breakdown which is chronic according to the patient.  Normal range of motion in all extremities. No joint effusions.  There is tenderness about the left knee diffusely without bony point tenderness.  No edema.  There is bruising from the fall without signs of tissue ischemia. Neurologic:   Normal speech and language.  Motor grossly intact. No acute focal neurologic deficits are appreciated.  Skin:    Skin is warm, dry with left stump ulcer as noted above. No rash noted.  No petechiae, purpura, or bullae.  Soiled with dried stool  ____________________________________________    LABS (pertinent positives/negatives) (all labs  ordered are listed, but only abnormal results are displayed) Labs Reviewed  COMPREHENSIVE METABOLIC PANEL - Abnormal; Notable for the following components:      Result Value   Chloride 95 (*)    Glucose, Bld 145 (*)    BUN 50 (*)    Creatinine, Ser 6.70 (*)    Total Bilirubin 3.2 (*)    GFR calc non Af Amer 7 (*)    GFR calc Af Amer 9 (*)    Anion gap 17 (*)    All other components within normal limits  CBC WITH DIFFERENTIAL/PLATELET - Abnormal; Notable  for the following components:   WBC 13.7 (*)    RBC 2.98 (*)    Hemoglobin 9.2 (*)    HCT 27.5 (*)    RDW 17.8 (*)    Platelets 92 (*)    Neutro Abs 11.3 (*)    Abs Immature Granulocytes 0.15 (*)    All other components within normal limits  PROTIME-INR - Abnormal; Notable for the following components:   Prothrombin Time 22.9 (*)    INR 2.1 (*)    All other components within normal limits  ETHANOL   ____________________________________________   EKG    ____________________________________________    RADIOLOGY  DG Knee Complete 4 Views Left  Result Date: 09/09/2019 CLINICAL DATA:  Fall knee pain status post below the knee amputation. EXAM: LEFT KNEE - COMPLETE 4+ VIEW COMPARISON:  None FINDINGS: Osteopenia. Signs of below the knee amputation. Mild skin thickening and or edema about the stump. Some edema in the soft tissues particularly along the lateral aspect. No sign of fracture or dislocation.  No joint effusion. IMPRESSION: 1. Signs of below-the-knee amputation with mild skin thickening and or edema about the stump and along the lateral aspect. 2. No signs of fracture. Electronically Signed   By: Zetta Bills M.D.   On: 09/09/2019 09:49    ____________________________________________   PROCEDURES Procedures  ____________________________________________  DIFFERENTIAL DIAGNOSIS   Dehydration, anemia, electrolyte abnormality, opioid withdrawal, knee fracture  CLINICAL IMPRESSION / ASSESSMENT AND PLAN / ED  COURSE  Medications ordered in the ED: Medications  sodium chloride 0.9 % bolus 500 mL (0 mLs Intravenous Stopped 09/09/19 1045)  oxyCODONE (Oxy IR/ROXICODONE) immediate release tablet 10 mg (10 mg Oral Given 09/09/19 0942)  acetaminophen (TYLENOL) tablet 1,000 mg (1,000 mg Oral Given 09/09/19 0942)  pregabalin (LYRICA) capsule 25 mg (25 mg Oral Given 09/09/19 1044)    Pertinent labs & imaging results that were available during my care of the patient were reviewed by me and considered in my medical decision making (see chart for details).  BERT PTACEK was evaluated in Emergency Department on 09/09/2019 for the symptoms described in the history of present illness. He was evaluated in the context of the global COVID-19 pandemic, which necessitated consideration that the patient might be at risk for infection with the SARS-CoV-2 virus that causes COVID-19. Institutional protocols and algorithms that pertain to the evaluation of patients at risk for COVID-19 are in a state of rapid change based on information released by regulatory bodies including the CDC and federal and state organizations. These policies and algorithms were followed during the patient's care in the ED.   Patient presents after mechanical fall in the setting of abrupt cessation of oxycodone and Lyrica.  No significant findings for trauma, will check labs, give gentle IV fluids given dialysis dependence with evidence of dehydration related to diarrhea.  Clinical Course as of Sep 09 1050  Mon Sep 09, 2019  6599 Work-up negative.  Patient is bilateral amputee and uses a wheelchair which is at his residence.  We will plan to discharge by EMS.   [PS]    Clinical Course User Index [PS] Carrie Mew, MD     ----------------------------------------- 11:03 AM on 09/09/2019 -----------------------------------------  Patient reports feeling much better.  Stable for discharge.  ----------------------------------------- 2:00 PM  on 09/09/2019 ----------------------------------------- On discharge vitals, patient noted to have low blood pressure of about 80/50.  Will obtain chest x-ray and give additional IV fluid bolus of 500 mL.  I think this is  due to dehydration from diarrhea, as the patient's other vital signs are normal, no significant leukocytosis, and I doubt sepsis.   ____________________________________________   FINAL CLINICAL IMPRESSION(S) / ED DIAGNOSES    Final diagnoses:  Fall from bed, initial encounter  Type 2 diabetes mellitus without complication, without long-term current use of insulin ([place])  ESRD on hemodialysis ([place])  Opioid dependence   ED Discharge Orders         Ordered    loperamide (IMODIUM A-D) 2 MG tablet  4 times daily PRN     Discontinue  Reprint     09/09/19 1051          Portions of this note were generated with dragon dictation software. Dictation errors may occur despite best attempts at proofreading.   Carrie Mew, MD 09/09/19 [place]    Carrie Mew, MD 09/09/19 1104    Carrie Mew, MD 09/10/19 [phone]

## 2019-09-09 NOTE — ED Notes (Signed)
Pt can be heard yelling, "help, help, help" from the nurses station. This RN at bedside. Pt stating, "I want my damn lyrica." This RN told pt that pharmacy has to walk medicine to the ED since it is not in the pyxis and it is a controlled substance. Pt started to continue to yell for help. This RN stated yelling for help is not going to get the medicine here faster. Pt stating, "Yes it will" and resumed yelling. This RN stating to pt the medicine will be administered as soon as pharmacy brings it. MD made aware.

## 2019-09-09 NOTE — ED Notes (Signed)
Resp at bedside for ABG collection

## 2019-09-09 NOTE — Progress Notes (Signed)
From E-link standpoint following sepsis protocol, Dr. Delman Kitten has made a specific and thorough note regarding fluid resuscitation including ESRD status.

## 2019-09-10 ENCOUNTER — Inpatient Hospital Stay: Payer: No Typology Code available for payment source

## 2019-09-10 ENCOUNTER — Ambulatory Visit (HOSPITAL_COMMUNITY)
Admission: AD | Admit: 2019-09-10 | Discharge: 2019-09-10 | Disposition: A | Discharge status: Home / Self Care | Payer: Medicare Other | Source: Other Acute Inpatient Hospital | Attending: Internal Medicine | Admitting: Internal Medicine

## 2019-09-10 DIAGNOSIS — A419 Sepsis, unspecified organism: Secondary | ICD-10-CM | POA: Insufficient documentation

## 2019-09-10 LAB — MRSA PCR SCREENING: MRSA by PCR: NEGATIVE

## 2019-09-10 LAB — GASTROINTESTINAL PANEL BY PCR, STOOL (REPLACES STOOL CULTURE)

## 2019-09-10 LAB — CBC
HCT: 25.7 % — ABNORMAL LOW (ref 39.0–52.0)
Hemoglobin: 8.5 g/dL — ABNORMAL LOW (ref 13.0–17.0)
MCH: 31.3 pg (ref 26.0–34.0)
MCHC: 33.1 g/dL (ref 30.0–36.0)
MCV: 94.5 fL (ref 80.0–100.0)
Platelets: 96 10*3/uL — ABNORMAL LOW (ref 150–400)
RBC: 2.72 MIL/uL — ABNORMAL LOW (ref 4.22–5.81)
RDW: 17.6 % — ABNORMAL HIGH (ref 11.5–15.5)
WBC: 12.5 10*3/uL — ABNORMAL HIGH (ref 4.0–10.5)
nRBC: 0 % (ref 0.0–0.2)

## 2019-09-10 LAB — C DIFFICILE QUICK SCREEN W PCR REFLEX
C Diff antigen: NEGATIVE
C Diff interpretation: NOT DETECTED
C Diff toxin: NEGATIVE

## 2019-09-10 LAB — COMPREHENSIVE METABOLIC PANEL
ALT: 17 U/L (ref 0–44)
AST: 24 U/L (ref 15–41)
Albumin: 3.4 g/dL — ABNORMAL LOW (ref 3.5–5.0)
Alkaline Phosphatase: 43 U/L (ref 38–126)
Anion gap: 16 — ABNORMAL HIGH (ref 5–15)
BUN: 60 mg/dL — ABNORMAL HIGH (ref 8–23)
CO2: 23 mmol/L (ref 22–32)
Calcium: 8.7 mg/dL — ABNORMAL LOW (ref 8.9–10.3)
Chloride: 95 mmol/L — ABNORMAL LOW (ref 98–111)
Creatinine, Ser: 7.07 mg/dL — ABNORMAL HIGH (ref 0.61–1.24)
GFR calc Af Amer: 8 mL/min — ABNORMAL LOW (ref >60)
GFR calc non Af Amer: 7 mL/min — ABNORMAL LOW (ref >60)
Glucose, Bld: 132 mg/dL — ABNORMAL HIGH (ref 70–99)
Potassium: 5 mmol/L (ref 3.5–5.1)
Sodium: 134 mmol/L — ABNORMAL LOW (ref 135–145)
Total Bilirubin: 2.4 mg/dL — ABNORMAL HIGH (ref 0.3–1.2)
Total Protein: 6.2 g/dL — ABNORMAL LOW (ref 6.5–8.1)

## 2019-09-10 LAB — GLUCOSE, CAPILLARY
Glucose-Capillary: 101 mg/dL — ABNORMAL HIGH (ref 70–99)
Glucose-Capillary: 114 mg/dL — ABNORMAL HIGH (ref 70–99)
Glucose-Capillary: 115 mg/dL — ABNORMAL HIGH (ref 70–99)
Glucose-Capillary: 119 mg/dL — ABNORMAL HIGH (ref 70–99)
Glucose-Capillary: 127 mg/dL — ABNORMAL HIGH (ref 70–99)
Glucose-Capillary: 130 mg/dL — ABNORMAL HIGH (ref 70–99)

## 2019-09-10 LAB — TROPONIN I (HIGH SENSITIVITY)
Troponin I (High Sensitivity): 101 ng/L (ref <18)
Troponin I (High Sensitivity): 94 ng/L — ABNORMAL HIGH (ref <18)

## 2019-09-10 LAB — AMMONIA: Ammonia: 12 umol/L (ref 9–35)

## 2019-09-10 MED ORDER — VANCOMYCIN HCL IN DEXTROSE 1-5 GM/200ML-% IV SOLN: 1000.0000 mg | INTRAVENOUS | Status: DC

## 2019-09-10 MED ORDER — METOPROLOL TARTRATE 5 MG/5ML IV SOLN
5.0000 mg | Freq: Once | INTRAVENOUS | Status: AC
  Administered 2019-09-10: 5 mg via INTRAVENOUS
  Filled 2019-09-10: qty 5

## 2019-09-10 MED ORDER — LIDOCAINE 5 % EX PTCH: 1.0000 | MEDICATED_PATCH | CUTANEOUS | 0 refills | Status: DC

## 2019-09-10 MED ORDER — CHLORHEXIDINE GLUCONATE CLOTH 2 % EX PADS
6.0000 | MEDICATED_PAD | Freq: Every day | CUTANEOUS | Status: DC
  Administered 2019-09-10 (×2): 6 via TOPICAL

## 2019-09-10 MED ORDER — LIDOCAINE 5 % EX PTCH
1.0000 | MEDICATED_PATCH | CUTANEOUS | Status: DC
  Administered 2019-09-10: 1 via TRANSDERMAL
  Filled 2019-09-10 (×2): qty 1

## 2019-09-10 MED ORDER — EPOETIN ALFA 10000 UNIT/ML IJ SOLN
4000.0000 [IU] | INTRAMUSCULAR | Status: DC
  Filled 2019-09-10: qty 1

## 2019-09-10 MED ORDER — SODIUM CHLORIDE 0.9 % IV SOLN: 1.0000 g | INTRAVENOUS | Status: DC

## 2019-09-10 MED ORDER — SODIUM CHLORIDE 0.9 % IV SOLN
250.0000 mL | INTRAVENOUS | Status: DC
  Administered 2019-09-10: 250 mL via INTRAVENOUS

## 2019-09-10 MED ORDER — VANCOMYCIN HCL 500 MG/100ML IV SOLN
500.0000 mg | Freq: Once | INTRAVENOUS | Status: AC
  Administered 2019-09-10: 500 mg via INTRAVENOUS
  Filled 2019-09-10: qty 100

## 2019-09-10 MED ORDER — HEPARIN SODIUM (PORCINE) 5000 UNIT/ML IJ SOLN: 5000.0000 [IU] | Freq: Three times a day (TID) | INTRAMUSCULAR | Status: DC

## 2019-09-10 MED ORDER — MORPHINE SULFATE (PF) 2 MG/ML IV SOLN
1.0000 mg | Freq: Once | INTRAVENOUS | Status: AC
  Administered 2019-09-11: 1 mg via INTRAVENOUS
  Filled 2019-09-10: qty 1

## 2019-09-10 MED ORDER — MORPHINE SULFATE (PF) 2 MG/ML IV SOLN
1.0000 mg | Freq: Once | INTRAVENOUS | Status: AC
  Administered 2019-09-10: 1 mg via INTRAVENOUS
  Filled 2019-09-10: qty 1

## 2019-09-10 MED ORDER — NOREPINEPHRINE 4 MG/250ML-% IV SOLN
2.0000 ug/min | INTRAVENOUS | Status: DC
  Administered 2019-09-10: 2 ug/min via INTRAVENOUS
  Filled 2019-09-10: qty 250

## 2019-09-10 MED ORDER — VANCOMYCIN HCL 500 MG/100ML IV SOLN: 500.0000 mg | Freq: Once | INTRAVENOUS | 0 refills | Status: AC

## 2019-09-10 NOTE — Progress Notes (Signed)
Hemodialysis patient known at [name]) TTS 11:15, patient rides with [facility]. Please contact me with any dialysis placement concerns.  [name] Dialysis Coordinator [phone]

## 2019-09-10 NOTE — Progress Notes (Signed)
Received a handoff report from HD RN, stated pt unable to complete HD due increase in HR to 130-140 and sustaining.  Pt back to room HR still in 130-140 and sustaining. BP WNL. Pt BP sugar 99, pt eating supper now. I will reach out to Provide on call for HR .

## 2019-09-10 NOTE — ED Notes (Signed)
PT BP is low, NP Randol Kern notified at this time. Pt remains asleep when not aroused Pt awakens with verbal stimuli and will answer questions. Pt will fall asleep if not spoken to.

## 2019-09-10 NOTE — Progress Notes (Signed)
[facility], [place] 09/10/19  Subjective:   LOS: 1  Patient known to our practice from outpatient dialysis.  He states that he has been feeling generalized weakness and diarrhea for the past several days.  This morning he fell when transferring to his wheelchair.  Came to the hospital for evaluation. Reports pain in the back Denies any acute shortness of breath Able to eat without nausea or vomiting  Objective:  Vital signs in last 24 hours:  Temp:  [97.8 F (36.6 C)-97.9 F (36.6 C)] 97.9 F (36.6 C) (07/06 1200) Pulse Rate:  [49-72] 69 (07/06 1200) Resp:  [13-22] 17 (07/06 1200) BP: (74-121)/(38-72) 95/51 (07/06 1200) SpO2:  [97 %-100 %] 100 % (07/06 1200)  Weight change:  Filed Weights   09/09/19 0846 09/09/19 0848  Weight: (!) 181 kg 81.6 kg    Intake/Output:    Intake/Output Summary (Last 24 hours) at 09/10/2019 1400 Last data filed at 09/10/2019 1000 Gross per 24 hour  Intake 2049.01 ml  Output --  Net 2049.01 ml     Physical Exam: General:  Chronically ill-appearing, laying in the bed  HEENT  anicteric, moist oral mucous membranes  Pulm/lungs  mild crackles at the bases.  [place] O2  CVS/Heart  no rub, irregular  Abdomen:   Soft, nontender  Extremities:  Bilateral BKA  Neurologic:  Alert, oriented  Skin:  Left leg ecchymosis, skin breakdown on left stump  Access:  left arm AV fistula       Basic Metabolic Panel:  Recent Labs  Lab 09/09/19 0839 09/09/19 2117 09/10/19 0125  NA 136  --  134*  K 4.8  --  5.0  CL 95*  --  95*  CO2 24  --  23  GLUCOSE 145*  --  132*  BUN 50*  --  60*  CREATININE 6.70*  --  7.07*  CALCIUM 9.2  --  8.7*  MG  --  1.6*  --      CBC: Recent Labs  Lab 09/09/19 0839 09/09/19 2117 09/10/19 0425  WBC 13.7* 8.8 12.5*  NEUTROABS 11.3*  --   --   HGB 9.2* 7.8* 8.5*  HCT 27.5* 24.0* 25.7*  MCV 92.3 96.0 94.5  PLT 92* 75* 96*      Lab Results  Component Value Date   HEPBSAG Negative  11/09/2018   HEPBIGM Negative 11/12/2018      Microbiology:  Recent Results (from the past 240 hour(s))  Blood Culture (routine x 2)     Status: None (Preliminary result)   Collection Time: 09/09/19  5:14 PM   Specimen: BLOOD  Result Value Ref Range Status   Specimen Description BLOOD LEFT ANTECUBITAL  Final   Special Requests   Final    BOTTLES DRAWN AEROBIC AND ANAEROBIC Blood Culture adequate volume   Culture   Final    NO GROWTH < 12 HOURS Performed at [facility], [address] [place], [place] [address]    Report Status PENDING  Incomplete  Blood Culture (routine x 2)     Status: None (Preliminary result)   Collection Time: 09/09/19  5:14 PM   Specimen: BLOOD  Result Value Ref Range Status   Specimen Description BLOOD BLOOD LEFT FOREARM  Final   Special Requests   Final    BOTTLES DRAWN AEROBIC AND ANAEROBIC Blood Culture results may not be optimal due to an inadequate volume of blood received in culture bottles   Culture   Final  NO GROWTH < 12 HOURS Performed at [facility], [place], [place] [address]    Report Status PENDING  Incomplete  SARS Coronavirus 2 by RT PCR (hospital order, performed in [facility] hospital lab) Nasopharyngeal Nasopharyngeal Swab     Status: None   Collection Time: 09/09/19  5:14 PM   Specimen: Nasopharyngeal Swab  Result Value Ref Range Status   SARS Coronavirus 2 NEGATIVE NEGATIVE Final    Comment: (NOTE) SARS-CoV-2 target nucleic acids are NOT DETECTED.  The SARS-CoV-2 RNA is generally detectable in upper and lower respiratory specimens during the acute phase of infection. The lowest concentration of SARS-CoV-2 viral copies this assay can detect is 250 copies / mL. A negative result does not preclude SARS-CoV-2 infection and should not be used as the sole basis for treatment or other patient management decisions.  A negative result may occur with improper specimen collection / handling,  submission of specimen other than nasopharyngeal swab, presence of viral mutation(s) within the areas targeted by this assay, and inadequate number of viral copies (<250 copies / mL). A negative result must be combined with clinical observations, patient history, and epidemiological information.  Fact Sheet for Patients:   StrictlyIdeas.no  Fact Sheet for Healthcare Providers: BankingDealers.co.za  This test is not yet approved or  cleared by the [place] FDA and has been authorized for detection and/or diagnosis of SARS-CoV-2 by FDA under an Emergency Use Authorization (EUA).  This EUA will remain in effect (meaning this test can be used) for the duration of the COVID-19 declaration under Section 564(b)(1) of the Act, 21 U.S.C. section 360bbb-3(b)(1), unless the authorization is terminated or revoked sooner.  Performed at [facility], [address] [place], [place] [address]   Gastrointestinal Panel by PCR , Stool     Status: None   Collection Time: 09/10/19  1:05 AM   Specimen: Stool  Result Value Ref Range Status   Campylobacter species NOT DETECTED NOT DETECTED Final   Plesimonas shigelloides NOT DETECTED NOT DETECTED Final   Salmonella species NOT DETECTED NOT DETECTED Final   Yersinia enterocolitica NOT DETECTED NOT DETECTED Final   Vibrio species NOT DETECTED NOT DETECTED Final   Vibrio cholerae NOT DETECTED NOT DETECTED Final   Enteroaggregative E coli (EAEC) NOT DETECTED NOT DETECTED Final   Enteropathogenic E coli (EPEC) NOT DETECTED NOT DETECTED Final   Enterotoxigenic E coli (ETEC) NOT DETECTED NOT DETECTED Final   Shiga like toxin producing E coli (STEC) NOT DETECTED NOT DETECTED Final   Shigella/Enteroinvasive E coli (EIEC) NOT DETECTED NOT DETECTED Final   Cryptosporidium NOT DETECTED NOT DETECTED Final   Cyclospora cayetanensis NOT DETECTED NOT DETECTED Final   Entamoeba histolytica NOT DETECTED  NOT DETECTED Final   Giardia lamblia NOT DETECTED NOT DETECTED Final   Adenovirus F40/41 NOT DETECTED NOT DETECTED Final   Astrovirus NOT DETECTED NOT DETECTED Final   Norovirus GI/GII NOT DETECTED NOT DETECTED Final   Rotavirus A NOT DETECTED NOT DETECTED Final   Sapovirus (I, II, IV, and V) NOT DETECTED NOT DETECTED Final    Comment: Performed at [facility], [address] [place], [place] [address]  C Difficile Quick Screen w PCR reflex     Status: None   Collection Time: 09/10/19  1:05 AM   Specimen: STOOL  Result Value Ref Range Status   C Diff antigen NEGATIVE NEGATIVE Final   C Diff toxin NEGATIVE NEGATIVE Final   C Diff interpretation No C.  difficile detected.  Final    Comment: Performed at [facility], [address] [place], [place] [address]  MRSA PCR Screening     Status: None   Collection Time: 09/10/19  9:30 AM   Specimen: Nasopharyngeal  Result Value Ref Range Status   MRSA by PCR NEGATIVE NEGATIVE Final    Comment:        The GeneXpert MRSA Assay (FDA approved for NASAL specimens only), is one component of a comprehensive MRSA colonization surveillance program. It is not intended to diagnose MRSA infection nor to guide or monitor treatment for MRSA infections. Performed at [facility], [address] [place], [place] [address]     Coagulation Studies: Recent Labs    09/09/19 0839  LABPROT 22.9*  INR 2.1*    Urinalysis: Recent Labs    09/09/19 1654  COLORURINE YELLOW*  LABSPEC 1.007  PHURINE 9.0*  GLUCOSEU >=500*  HGBUR NEGATIVE  BILIRUBINUR NEGATIVE  KETONESUR NEGATIVE  PROTEINUR 100*  NITRITE NEGATIVE  LEUKOCYTESUR NEGATIVE      Imaging: DG Chest Portable 1 View  Result Date: 09/09/2019 CLINICAL DATA:  Dyspnea. EXAM: PORTABLE CHEST 1 VIEW COMPARISON:  September 09, 2019 (12:25 p.m.) FINDINGS: Mild atelectasis is seen within the mid left lung and right lung base. There is no evidence of acute infiltrate,  pleural effusion or pneumothorax. The cardiac silhouette is markedly enlarged and unchanged in size. Degenerative changes seen throughout the thoracic spine. IMPRESSION: Stable cardiomegaly with mild atelectasis within the mid left lung and right lung base. Electronically Signed   By: Virgina Norfolk M.D.   On: 09/09/2019 22:42   DG Chest Portable 1 View  Result Date: 09/09/2019 CLINICAL DATA:  Weakness. Atrial fibrillation, diabetes, end-stage renal disease. Patient fell. EXAM: PORTABLE CHEST 1 VIEW COMPARISON:  11/09/2018 FINDINGS: Heart is enlarged and stable in configuration. There are no focal consolidations or pleural effusions. No pulmonary edema. No pneumothorax. No acute displaced rib fractures. IMPRESSION: Stable cardiomegaly. Electronically Signed   By: Nolon Nations M.D.   On: 09/09/2019 13:07   DG Knee Complete 4 Views Left  Result Date: 09/09/2019 CLINICAL DATA:  Fall knee pain status post below the knee amputation. EXAM: LEFT KNEE - COMPLETE 4+ VIEW COMPARISON:  None FINDINGS: Osteopenia. Signs of below the knee amputation. Mild skin thickening and or edema about the stump. Some edema in the soft tissues particularly along the lateral aspect. No sign of fracture or dislocation.  No joint effusion. IMPRESSION: 1. Signs of below-the-knee amputation with mild skin thickening and or edema about the stump and along the lateral aspect. 2. No signs of fracture. Electronically Signed   By: Zetta Bills M.D.   On: 09/09/2019 09:49     Medications:   . sodium chloride Stopped (09/10/19 0923)  . [START ON 09/11/2019] ceFEPime (MAXIPIME) IV    . norepinephrine (LEVOPHED) Adult infusion Stopped (09/10/19 0926)  . [START ON 09/12/2019] vancomycin    . vancomycin     . aspirin EC  81 mg Oral Daily  . atorvastatin  20 mg Oral Daily  . calcitRIOL  0.25 mcg Oral Daily  . Chlorhexidine Gluconate Cloth  6 each Topical Daily  . [START ON 09/12/2019] epoetin (EPOGEN/PROCRIT) injection  4,000 Units  Intravenous Q T,Th,Sa-HD  . furosemide  10 mg Oral Daily  . heparin  5,000 Units Subcutaneous Q8H  . lidocaine  1 patch Transdermal Q24H  . midodrine  5 mg Oral TID WC  . pregabalin  25 mg  Oral QHS  . rOPINIRole  1 mg Oral QHS   acetaminophen **OR** acetaminophen, hydroxypropyl methylcellulose / hypromellose  Assessment/ Plan:  74 y.o. male with end-stage renal disease, atrial fibrillation,Bilateral BKA, history of hip arthroplasty was admitted on 09/09/2019 for  Active Problems:   Sepsis ([place])  Diarrhea of presumed infectious origin [R19.7] ESRD on hemodialysis ([place]) [N18.6, Z99.2] Fall from bed, initial encounter [W06.XXXA] Sepsis ([place]) [A41.9] Type 2 diabetes mellitus without complication, without long-term current use of insulin ([place]) [E11.9]  CCK/TTS/[place] [facility]/105.5 kg/left arm AV fistula  #. ESRD We will arrange for routine hemodialysis today and maintain TTS schedule  #. Anemia of CKD  Lab Results  Component Value Date   HGB 8.5 (L) 09/10/2019   Low dose EPO with HD  #. Secondary hyperparathyroidism of renal origin N 25.81   No results found for: PTH Lab Results  Component Value Date   PHOS 5.6 (H) 11/14/2018   Monitor calcium and phos level during this admission   #.  Left stump necrotic wound Concern for infection. Consider surgical evaluation   LOS: [place] 7/6/20212:00 [place], [place]

## 2019-09-10 NOTE — ED Notes (Signed)
Pt is asleep inbed at this time

## 2019-09-10 NOTE — ED Notes (Signed)
Attempted to call floor for pt. Per Secretary RN will call back Ascom # given.

## 2019-09-10 NOTE — ED Notes (Signed)
Pt cleaned of incontinence by this nurse and Lorriane Shire, RN. Pt had BM, new brief placed on pt

## 2019-09-10 NOTE — ED Notes (Signed)
Pt checked and dry at this time. Pt placed in clean gown and given warm blanket.

## 2019-09-10 NOTE — Progress Notes (Signed)
Cross Cover Brief Note Patient with a fib RVR 130's. Noted dialysis not completed secondary to tachycardia.  BP stable. Patient in severe pain of left residual limb.  Limb warm but noted necrotic area on distal stump with minor serous drainage. Bruising noted.  Mottling signficant.  Ultrasound of limb rule out CVT. Low dose morphine 1 mg as severe lethargy after long acting opiate yesterday

## 2019-09-10 NOTE — ED Notes (Signed)
Pt continues to sleep at this time, lights are dimmed, call bell remains in reach

## 2019-09-10 NOTE — Progress Notes (Signed)
[facility], [place] 09/10/19  Subjective:   LOS: 1  Patient known to our practice from outpatient dialysis.  He states that he has been feeling generalized weakness and diarrhea for the past several days.  This morning he fell when transferring to his wheelchair.  Came to the hospital for evaluation. Reports pain in the back Denies any acute shortness of breath Able to eat without nausea or vomiting  Objective:  Vital signs in last 24 hours:  Temp:  [97.8 F (36.6 C)] 97.8 F (36.6 C) (07/06 0930) Pulse Rate:  [49-72] 72 (07/06 0930) Resp:  [13-24] 16 (07/06 0930) BP: (74-121)/(38-72) 106/72 (07/06 0930) SpO2:  [98 %-100 %] 99 % (07/06 0930)  Weight change:  Filed Weights   09/09/19 0846 09/09/19 0848  Weight: (!) 181 kg 81.6 kg    Intake/Output:    Intake/Output Summary (Last 24 hours) at 09/10/2019 6962 Last data filed at 09/09/2019 2146 Gross per 24 hour  Intake 2388.18 ml  Output --  Net 2388.18 ml     Physical Exam: General:  Chronically ill-appearing, laying in the bed  HEENT  anicteric, moist oral mucous membranes  Pulm/lungs  mild crackles at the bases.  [place] O2  CVS/Heart  no rub, irregular  Abdomen:   Soft, nontender  Extremities:  Bilateral BKA  Neurologic:  Alert, oriented  Skin:  Left leg ecchymosis, skin breakdown on left stump  Access:  left arm AV fistula       Basic Metabolic Panel:  Recent Labs  Lab 09/09/19 0839 09/09/19 2117 09/10/19 0125  NA 136  --  134*  K 4.8  --  5.0  CL 95*  --  95*  CO2 24  --  23  GLUCOSE 145*  --  132*  BUN 50*  --  60*  CREATININE 6.70*  --  7.07*  CALCIUM 9.2  --  8.7*  MG  --  1.6*  --      CBC: Recent Labs  Lab 09/09/19 0839 09/09/19 2117 09/10/19 0425  WBC 13.7* 8.8 12.5*  NEUTROABS 11.3*  --   --   HGB 9.2* 7.8* 8.5*  HCT 27.5* 24.0* 25.7*  MCV 92.3 96.0 94.5  PLT 92* 75* 96*      Lab Results  Component Value Date   HEPBSAG Negative 11/09/2018   HEPBIGM  Negative 11/12/2018      Microbiology:  Recent Results (from the past 240 hour(s))  Blood Culture (routine x 2)     Status: None (Preliminary result)   Collection Time: 09/09/19  5:14 PM   Specimen: BLOOD  Result Value Ref Range Status   Specimen Description BLOOD LEFT ANTECUBITAL  Final   Special Requests   Final    BOTTLES DRAWN AEROBIC AND ANAEROBIC Blood Culture adequate volume   Culture   Final    NO GROWTH < 12 HOURS Performed at [facility], [address], [place], [place] [address]    Report Status PENDING  Incomplete  Blood Culture (routine x 2)     Status: None (Preliminary result)   Collection Time: 09/09/19  5:14 PM   Specimen: BLOOD  Result Value Ref Range Status   Specimen Description BLOOD BLOOD LEFT FOREARM  Final   Special Requests   Final    BOTTLES DRAWN AEROBIC AND ANAEROBIC Blood Culture results may not be optimal due to an inadequate volume of blood received in culture bottles   Culture   Final    NO  GROWTH < 12 HOURS Performed at [facility], [address] [place], [place] [address]    Report Status PENDING  Incomplete  SARS Coronavirus 2 by RT PCR (hospital order, performed in [place] hospital lab) Nasopharyngeal Nasopharyngeal Swab     Status: None   Collection Time: 09/09/19  5:14 PM   Specimen: Nasopharyngeal Swab  Result Value Ref Range Status   SARS Coronavirus 2 NEGATIVE NEGATIVE Final    Comment: (NOTE) SARS-CoV-2 target nucleic acids are NOT DETECTED.  The SARS-CoV-2 RNA is generally detectable in upper and lower respiratory specimens during the acute phase of infection. The lowest concentration of SARS-CoV-2 viral copies this assay can detect is 250 copies / mL. A negative result does not preclude SARS-CoV-2 infection and should not be used as the sole basis for treatment or other patient management decisions.  A negative result may occur with improper specimen collection / handling, submission of specimen  other than nasopharyngeal swab, presence of viral mutation(s) within the areas targeted by this assay, and inadequate number of viral copies (<250 copies / mL). A negative result must be combined with clinical observations, patient history, and epidemiological information.  Fact Sheet for Patients:   StrictlyIdeas.no  Fact Sheet for Healthcare Providers: BankingDealers.co.za  This test is not yet approved or  cleared by the [place] FDA and has been authorized for detection and/or diagnosis of SARS-CoV-2 by FDA under an Emergency Use Authorization (EUA).  This EUA will remain in effect (meaning this test can be used) for the duration of the COVID-19 declaration under Section 564(b)(1) of the Act, 21 U.S.C. section 360bbb-3(b)(1), unless the authorization is terminated or revoked sooner.  Performed at [facility], [address] [place],  [address]   Gastrointestinal Panel by PCR , Stool     Status: None   Collection Time: 09/10/19  1:05 AM   Specimen: Stool  Result Value Ref Range Status   Campylobacter species NOT DETECTED NOT DETECTED Final   Plesimonas shigelloides NOT DETECTED NOT DETECTED Final   Salmonella species NOT DETECTED NOT DETECTED Final   Yersinia enterocolitica NOT DETECTED NOT DETECTED Final   Vibrio species NOT DETECTED NOT DETECTED Final   Vibrio cholerae NOT DETECTED NOT DETECTED Final   Enteroaggregative E coli (EAEC) NOT DETECTED NOT DETECTED Final   Enteropathogenic E coli (EPEC) NOT DETECTED NOT DETECTED Final   Enterotoxigenic E coli (ETEC) NOT DETECTED NOT DETECTED Final   Shiga like toxin producing E coli (STEC) NOT DETECTED NOT DETECTED Final   Shigella/Enteroinvasive E coli (EIEC) NOT DETECTED NOT DETECTED Final   Cryptosporidium NOT DETECTED NOT DETECTED Final   Cyclospora cayetanensis NOT DETECTED NOT DETECTED Final   Entamoeba histolytica NOT DETECTED NOT DETECTED Final    Giardia lamblia NOT DETECTED NOT DETECTED Final   Adenovirus F40/41 NOT DETECTED NOT DETECTED Final   Astrovirus NOT DETECTED NOT DETECTED Final   Norovirus GI/GII NOT DETECTED NOT DETECTED Final   Rotavirus A NOT DETECTED NOT DETECTED Final   Sapovirus (I, II, IV, and V) NOT DETECTED NOT DETECTED Final    Comment: Performed at [facility], [address] [place], [place] [address]  C Difficile Quick Screen w PCR reflex     Status: None   Collection Time: 09/10/19  1:05 AM   Specimen: STOOL  Result Value Ref Range Status   C Diff antigen NEGATIVE NEGATIVE Final   C Diff toxin NEGATIVE NEGATIVE Final   C Diff interpretation No C. difficile  detected.  Final    Comment: Performed at [facility], [address] [place], [place] [address]    Coagulation Studies: Recent Labs    09/09/19 0839  LABPROT 22.9*  INR 2.1*    Urinalysis: Recent Labs    09/09/19 1654  COLORURINE YELLOW*  LABSPEC 1.007  PHURINE 9.0*  GLUCOSEU >=500*  HGBUR NEGATIVE  BILIRUBINUR NEGATIVE  KETONESUR NEGATIVE  PROTEINUR 100*  NITRITE NEGATIVE  LEUKOCYTESUR NEGATIVE      Imaging: DG Chest Portable 1 View  Result Date: 09/09/2019 CLINICAL DATA:  Dyspnea. EXAM: PORTABLE CHEST 1 VIEW COMPARISON:  September 09, 2019 (12:25 p.m.) FINDINGS: Mild atelectasis is seen within the mid left lung and right lung base. There is no evidence of acute infiltrate, pleural effusion or pneumothorax. The cardiac silhouette is markedly enlarged and unchanged in size. Degenerative changes seen throughout the thoracic spine. IMPRESSION: Stable cardiomegaly with mild atelectasis within the mid left lung and right lung base. Electronically Signed   By: Virgina Norfolk M.D.   On: 09/09/2019 22:42   DG Chest Portable 1 View  Result Date: 09/09/2019 CLINICAL DATA:  Weakness. Atrial fibrillation, diabetes, end-stage renal disease. Patient fell. EXAM: PORTABLE CHEST 1 VIEW COMPARISON:  11/09/2018 FINDINGS: Heart is  enlarged and stable in configuration. There are no focal consolidations or pleural effusions. No pulmonary edema. No pneumothorax. No acute displaced rib fractures. IMPRESSION: Stable cardiomegaly. Electronically Signed   By: Nolon Nations M.D.   On: 09/09/2019 13:07   DG Knee Complete 4 Views Left  Result Date: 09/09/2019 CLINICAL DATA:  Fall knee pain status post below the knee amputation. EXAM: LEFT KNEE - COMPLETE 4+ VIEW COMPARISON:  None FINDINGS: Osteopenia. Signs of below the knee amputation. Mild skin thickening and or edema about the stump. Some edema in the soft tissues particularly along the lateral aspect. No sign of fracture or dislocation.  No joint effusion. IMPRESSION: 1. Signs of below-the-knee amputation with mild skin thickening and or edema about the stump and along the lateral aspect. 2. No signs of fracture. Electronically Signed   By: Zetta Bills M.D.   On: 09/09/2019 09:49     Medications:   . sodium chloride 250 mL (09/10/19 0115)  . norepinephrine (LEVOPHED) Adult infusion 3 mcg/min (09/10/19 0351)   . aspirin EC  81 mg Oral Daily  . atorvastatin  20 mg Oral Daily  . calcitRIOL  0.25 mcg Oral Daily  . Chlorhexidine Gluconate Cloth  6 each Topical Daily  . furosemide  10 mg Oral Daily  . heparin  5,000 Units Subcutaneous Q8H  . lidocaine  1 patch Transdermal Q24H  . midodrine  5 mg Oral TID WC  . pregabalin  25 mg Oral QHS  . rOPINIRole  1 mg Oral QHS   acetaminophen **OR** acetaminophen, hydroxypropyl methylcellulose / hypromellose  Assessment/ Plan:  74 y.o. male with end-stage renal disease, atrial fibrillation,Bilateral BKA, history of hip arthroplasty was admitted on 09/09/2019 for  Active Problems:   Sepsis ([place])  Diarrhea of presumed infectious origin [R19.7] ESRD on hemodialysis ([place]) [N18.6, Z99.2] Fall from bed, initial encounter [W06.XXXA] Sepsis ([place]) [A41.9] Type 2 diabetes mellitus without complication, without long-term current use of  insulin ([place]) [E11.9]  CCK/TTS/[place] [facility]/105.5 kg/left arm AV fistula  #. ESRD We will arrange for routine hemodialysis today and maintain TTS schedule  #. Anemia of CKD  Lab Results  Component Value Date   HGB 8.5 (L) 09/10/2019   Low dose EPO with HD  #.  Secondary hyperparathyroidism of renal origin N 25.81   No results found for: PTH Lab Results  Component Value Date   PHOS 5.6 (H) 11/14/2018   Monitor calcium and phos level during this admission   #.  Left stump necrotic wound Concern for infection. Consider surgical evaluation   LOS: [place] 7/6/20219:39 AM  [place], [place]

## 2019-09-10 NOTE — ED Notes (Signed)
Pt reports needing to be cleaned due to a BM. Pt cleaned by this nurse and Alwyn Pea. Pt has small BM, no urine. Cleaned and new brief placed on pt. Bed remains clean and dry at this time. Call light remains in reach

## 2019-09-10 NOTE — Progress Notes (Signed)
Hd started  

## 2019-09-10 NOTE — Progress Notes (Signed)
Handoff report to Carmelina Dane RN in [place]   [place], [place] room [phone]. MD in [place] tonight updated over the phone. Pt informed about his transfer.

## 2019-09-10 NOTE — Progress Notes (Signed)
Pt to dialysis.

## 2019-09-10 NOTE — TOC Initial Note (Signed)
Transition of Care [facility]) - Initial/Assessment Note    Patient Details  Name: Antonio Phillips MRN: [id number] Date of Birth: 1945/11/11  [facility] CM/SW Contact:    Shelbie Ammons, RN Phone Number: 09/10/2019, 1:31 PM  Clinical Narrative:     RNCM assessed patient at bedside, high risk assessment completed, patient reports that nothing has changed since he was last here. He still lives at extended stay motel on [address] for transportation, his PCP is a [place] and gets his medications there as well. Patient requests transfer to [facility]. Did inform patient that this nurse would follow up on that to see if it would be an option. Placed call and spoke with Ivin Booty in transfer center. Forms for transfer were completed and faxed to AOD office. Patient denies any other needs at this time but CM will remain available.               Expected Discharge Plan: Home/Self Care Barriers to Discharge: Continued Medical Work up   Patient Goals and CMS Choice        Expected Discharge Plan and Services Expected Discharge Plan: Home/Self Care       Living arrangements for the past 2 months: Hotel/Motel                                      Prior Living Arrangements/Services Living arrangements for the past 2 months: Hotel/Motel Lives with:: Self Patient language and need for interpreter reviewed:: Yes Do you feel safe going back to the place where you live?: Yes      Need for Family Participation in Patient Care: No (Comment) Care giver support system in place?: No (comment)   Criminal Activity/Legal Involvement Pertinent to Current Situation/Hospitalization: No - Comment as needed  Activities of Daily Living Home Assistive Devices/Equipment: Prosthesis, Wheelchair ADL Screening (condition at time of admission) Patient's cognitive ability adequate to safely complete daily activities?: Yes Is the patient deaf or have difficulty hearing?: No Does the patient  have difficulty seeing, even when wearing glasses/contacts?: No Does the patient have difficulty concentrating, remembering, or making decisions?: No Patient able to express need for assistance with ADLs?: Yes Does the patient have difficulty dressing or bathing?: No Independently performs ADLs?: Yes (appropriate for developmental age) Does the patient have difficulty walking or climbing stairs?: Yes Weakness of Legs: Both Weakness of Arms/Hands: None  Permission Sought/Granted                  Emotional Assessment Appearance:: Appears stated age Attitude/Demeanor/Rapport: Guarded Affect (typically observed): Defensive Orientation: : Oriented to Self, Oriented to Place, Oriented to  Time, Oriented to Situation Alcohol / Substance Use: Not Applicable Psych Involvement: No (comment)  Admission diagnosis:  Diarrhea of presumed infectious origin [R19.7] ESRD on hemodialysis ([place]) [N18.6, Z99.2] Fall from bed, initial encounter [W06.XXXA] Sepsis ([place]) [A41.9] Type 2 diabetes mellitus without complication, without long-term current use of insulin ([place]) [E11.9] Patient Active Problem List   Diagnosis Date Noted  . Sepsis ([place]) 09/09/2019  . Hyperkalemia 04/25/2018  . UTI (urinary tract infection) 04/25/2018  . Diabetes ([place]) 04/25/2018  . ESRD on dialysis [facility]) 04/25/2018   PCP:  Marsh Dolly, MD Pharmacy:   [facility], [place] [address] Phone: [phone] Fax: [phone]     Social Determinants of Health ([place])  Interventions    Readmission Risk Interventions Readmission Risk Prevention Plan 11/14/2018 11/09/2018  Transportation Screening Complete Complete  PCP or Specialist Appt within 3-5 Days Patient refused -  Palliative Care Screening Not Applicable -  Medication Review (RN Care Manager) Complete Complete  Some recent data might be hidden

## 2019-09-10 NOTE — Progress Notes (Signed)
Cross Cover Brief note Patient continued with lethargy throughout night. Falls asleep easily.  ABG 7.43-35-14 -123.2 Ammonia level - 12 Hypotension not improved with IV fluids ad albumn. Peripheral levophed started. Magmesoim replaced

## 2019-09-10 NOTE — Consult Note (Signed)
Pharmacy Antibiotic Note  Antonio Phillips is a 74 y.o. male admitted on 09/09/2019 with cellulitis possibly due t a malfitting prosthesis s/p B/L BKA.  Pharmacy has been consulted for vancomycin and cefepime dosing. He received a 1000mg  loading dose of vancomycin and cefepime 2 grams in the ED. His normal HD schedule is TTS.  Plan: 1) Vancomycin   Vancomycin 500 mg IV x 1 to complete loading dose  Vancomycin 1000 mg TTS w/ HD  Target vancomycin level 15 - 25 mcg/mL (prior to 3rd HD session)  2) cefepime 1 gram IV every 24 hours (1st dose after HD tomorrow)   Height: 5\' 11"  (180.3 cm) Weight: 81.6 kg (180 lb) IBW/kg (Calculated) : 75.3  Temp (24hrs), Avg:97.9 F (36.6 C), Min:97.8 F (36.6 C), Max:97.9 F (36.6 C)  Recent Labs  Lab 09/09/19 0839 09/09/19 1714 09/09/19 2019 09/09/19 2117 09/10/19 0125 09/10/19 0425  WBC 13.7*  --   --  8.8  --  12.5*  CREATININE 6.70*  --   --   --  7.07*  --   LATICACIDVEN  --  2.3* 2.2*  --   --   --     Estimated Creatinine Clearance: 9.8 mL/min (A) (by C-G formula based on SCr of 7.07 mg/dL (H)).    No Known Allergies  Antimicrobials this admission: vancomycin 7/5 >>  cefepime 7/5 >>   Microbiology results: 7/5 BCx: NG < 12h 7/6 GI panel: negative 7/6 C diff: negative 7/5 UCx: pending  7/5 SARS CoV-2: negatife  7/6 MRSA PCR: negative  Thank you for allowing pharmacy to be a part of this patient's care.  Dallie Piles 09/10/2019 1:34 PM

## 2019-09-10 NOTE — Discharge Summary (Addendum)
Physician Discharge Summary  Antonio Phillips:[id number] DOB: Jul 12, 1945 DOA: 09/09/2019  PCP: Marsh Dolly, MD  Admit date: 09/09/2019 Discharge date: 09/10/2019  Recommendations for Outpatient Follow-up:  1. Transfer to [facility]    Marsh Dolly, MD In 1 day.   Specialty: Internal Medicine Contact information: [place]  [address] [phone]              Discharge Diagnoses: Principal diagnosis is #1 1. Sepsis 2. Concern for infected left stump, not ruled out 3. ESRD on HD 4. Diarrhea 5. DM II 6. HAGMA 7. Anemia of Renal disease 8. Atrial Fibrillation - Rate controlled  Discharge Condition: Fair  Disposition: [place]  Diet recommendation: Heart healthy/Modified carbohydrate  Filed Weights   09/09/19 0846 09/09/19 0848  Weight: (!) 181 kg 81.6 kg    History of present illness:   The patient is a 74 yr old homeless man who lives in a hotel. He states that he has been feeling bad for 3 days. He carries a medical history significant for ESRD on HD, B/l BKA, DM II, PAF. He fell out of the bed this am and was unable to get up off of the floor. He fell on his knee which is somewhat blistered due to a malfitting prosthesis. EMS had been called to his motel room where he is residing twice yesterday. The patient notably complains of having run out of his Lyrica and oxycodone for several days. However, PDMP has demonstrated that the patient's prescription of 104 oxycodone 5 mg had been filled on 08/07/2019 and 09/02/2019.  The patient denies fevers or chills, no shortness of breath or cough, No sore throat or rhinorrhea. He has had diarrhea, abdominal pain, or vomiting. He is complaining of pain in his left stump.  In the ED he was found to have a BP initially of 123/54. However, by this afternoon blood pressure had dropped into the seventies. Lactic acid was 2.3. WBC was 2.3. Stool for C Diff. UCx has been sent. Blood cultures x 2  will be obtained.   [facility] has been consulted to admit the patient for further evaluation and care. Nephrology has been consulted. The patient has been placed on a sepsis protocol.   Hospital Course:  The patient was admitted to the ICU as he was requiring Levophed to maintain his blood pressures. The patient is being transferred to the [facility] in [place], [place]. He has been accepted there and is just awaiting transfer.  Today's assessment: S: The patient is resting comfortably. No new complaints. O: Vitals:  Vitals:   09/10/19 1100 09/10/19 1200  BP: (!) 93/58 (!) 95/51  Pulse: 66 69  Resp: 15 17  Temp:  97.9 F (36.6 C)  SpO2: 98% 100%   General appearance: alert, cooperative and mild distress Head: Normocephalic, without obvious abnormality, atraumatic Eyes: conjunctivae/corneas clear. PERRL, EOM's intact. Fundi benign. Throat: lips, mucosa, and tongue normal; teeth and gums normal Neck: no adenopathy, no carotid bruit, no JVD, supple, symmetrical, trachea midline and thyroid not enlarged, symmetric, no tenderness/mass/nodules Resp: No increased work of breathing. No wheezes, rales, or rhonchi. No tactile fremitus. Chest wall: no tenderness Cardio: regular rate and rhythm, S1, S2 normal, no murmur, click, rub or gallop GI: soft, non-tender; bowel sounds normal; no masses,  no organomegaly Extremities: Bilateral BKA stumps. Left stump is ecchymotic with a skin tear and erythema. It is painful to touch. Pulses: 2+ and symmetric Skin: Skin color, texture, turgor  normal. No rashes or lesions or with exception for skin tear and ecchymosis of left knee. Lymph nodes: Cervical, supraclavicular, and axillary nodes normal. Neurologic: Grossly normal   Discharge Instructions       No Known Allergies  The results of significant diagnostics from this hospitalization (including imaging, microbiology, ancillary and laboratory) are listed below for reference.    Significant  Diagnostic Studies: DG Chest Portable 1 View  Result Date: 09/09/2019 CLINICAL DATA:  Dyspnea. EXAM: PORTABLE CHEST 1 VIEW COMPARISON:  September 09, 2019 (12:25 p.m.) FINDINGS: Mild atelectasis is seen within the mid left lung and right lung base. There is no evidence of acute infiltrate, pleural effusion or pneumothorax. The cardiac silhouette is markedly enlarged and unchanged in size. Degenerative changes seen throughout the thoracic spine. IMPRESSION: Stable cardiomegaly with mild atelectasis within the mid left lung and right lung base. Electronically Signed   By: Virgina Norfolk M.D.   On: 09/09/2019 22:42   DG Chest Portable 1 View  Result Date: 09/09/2019 CLINICAL DATA:  Weakness. Atrial fibrillation, diabetes, end-stage renal disease. Patient fell. EXAM: PORTABLE CHEST 1 VIEW COMPARISON:  11/09/2018 FINDINGS: Heart is enlarged and stable in configuration. There are no focal consolidations or pleural effusions. No pulmonary edema. No pneumothorax. No acute displaced rib fractures. IMPRESSION: Stable cardiomegaly. Electronically Signed   By: Nolon Nations M.D.   On: 09/09/2019 13:07   DG Knee Complete 4 Views Left  Result Date: 09/09/2019 CLINICAL DATA:  Fall knee pain status post below the knee amputation. EXAM: LEFT KNEE - COMPLETE 4+ VIEW COMPARISON:  None FINDINGS: Osteopenia. Signs of below the knee amputation. Mild skin thickening and or edema about the stump. Some edema in the soft tissues particularly along the lateral aspect. No sign of fracture or dislocation.  No joint effusion. IMPRESSION: 1. Signs of below-the-knee amputation with mild skin thickening and or edema about the stump and along the lateral aspect. 2. No signs of fracture. Electronically Signed   By: Zetta Bills M.D.   On: 09/09/2019 09:49    Microbiology: Recent Results (from the past 240 hour(s))  Blood Culture (routine x 2)     Status: None (Preliminary result)   Collection Time: 09/09/19  5:14 PM   Specimen: BLOOD   Result Value Ref Range Status   Specimen Description BLOOD LEFT ANTECUBITAL  Final   Special Requests   Final    BOTTLES DRAWN AEROBIC AND ANAEROBIC Blood Culture adequate volume   Culture   Final    NO GROWTH < 12 HOURS Performed at [facility], [address] [place], [place] [address]    Report Status PENDING  Incomplete  Blood Culture (routine x 2)     Status: None (Preliminary result)   Collection Time: 09/09/19  5:14 PM   Specimen: BLOOD  Result Value Ref Range Status   Specimen Description BLOOD BLOOD LEFT FOREARM  Final   Special Requests   Final    BOTTLES DRAWN AEROBIC AND ANAEROBIC Blood Culture results may not be optimal due to an inadequate volume of blood received in culture bottles   Culture   Final    NO GROWTH < 12 HOURS Performed at [facility], [address] [place], [place] [address]    Report Status PENDING  Incomplete  SARS Coronavirus 2 by RT PCR (hospital order, performed in [place] hospital lab) Nasopharyngeal Nasopharyngeal Swab     Status: None   Collection Time: 09/09/19  5:14 PM   Specimen: Nasopharyngeal  Swab  Result Value Ref Range Status   SARS Coronavirus 2 NEGATIVE NEGATIVE Final    Comment: (NOTE) SARS-CoV-2 target nucleic acids are NOT DETECTED.  The SARS-CoV-2 RNA is generally detectable in upper and lower respiratory specimens during the acute phase of infection. The lowest concentration of SARS-CoV-2 viral copies this assay can detect is 250 copies / mL. A negative result does not preclude SARS-CoV-2 infection and should not be used as the sole basis for treatment or other patient management decisions.  A negative result may occur with improper specimen collection / handling, submission of specimen other than nasopharyngeal swab, presence of viral mutation(s) within the areas targeted by this assay, and inadequate number of viral copies (<250 copies / mL). A negative result must be combined with  clinical observations, patient history, and epidemiological information.  Fact Sheet for Patients:   StrictlyIdeas.no  Fact Sheet for Healthcare Providers: BankingDealers.co.za  This test is not yet approved or  cleared by the [place] FDA and has been authorized for detection and/or diagnosis of SARS-CoV-2 by FDA under an Emergency Use Authorization (EUA).  This EUA will remain in effect (meaning this test can be used) for the duration of the COVID-19 declaration under Section 564(b)(1) of the Act, 21 U.S.C. section 360bbb-3(b)(1), unless the authorization is terminated or revoked sooner.  Performed at [facility], [address] [place], [place] [address]   Gastrointestinal Panel by PCR , Stool     Status: None   Collection Time: 09/10/19  1:05 AM   Specimen: Stool  Result Value Ref Range Status   Campylobacter species NOT DETECTED NOT DETECTED Final   Plesimonas shigelloides NOT DETECTED NOT DETECTED Final   Salmonella species NOT DETECTED NOT DETECTED Final   Yersinia enterocolitica NOT DETECTED NOT DETECTED Final   Vibrio species NOT DETECTED NOT DETECTED Final   Vibrio cholerae NOT DETECTED NOT DETECTED Final   Enteroaggregative E coli (EAEC) NOT DETECTED NOT DETECTED Final   Enteropathogenic E coli (EPEC) NOT DETECTED NOT DETECTED Final   Enterotoxigenic E coli (ETEC) NOT DETECTED NOT DETECTED Final   Shiga like toxin producing E coli (STEC) NOT DETECTED NOT DETECTED Final   Shigella/Enteroinvasive E coli (EIEC) NOT DETECTED NOT DETECTED Final   Cryptosporidium NOT DETECTED NOT DETECTED Final   Cyclospora cayetanensis NOT DETECTED NOT DETECTED Final   Entamoeba histolytica NOT DETECTED NOT DETECTED Final   Giardia lamblia NOT DETECTED NOT DETECTED Final   Adenovirus F40/41 NOT DETECTED NOT DETECTED Final   Astrovirus NOT DETECTED NOT DETECTED Final   Norovirus GI/GII NOT DETECTED NOT DETECTED Final    Rotavirus A NOT DETECTED NOT DETECTED Final   Sapovirus (I, II, IV, and V) NOT DETECTED NOT DETECTED Final    Comment: Performed at [facility], [address] [place], [place] [address]  C Difficile Quick Screen w PCR reflex     Status: None   Collection Time: 09/10/19  1:05 AM   Specimen: STOOL  Result Value Ref Range Status   C Diff antigen NEGATIVE NEGATIVE Final   C Diff toxin NEGATIVE NEGATIVE Final   C Diff interpretation No C. difficile detected.  Final    Comment: Performed at [facility], [address] [place], [place] [address]  MRSA PCR Screening     Status: None   Collection Time: 09/10/19  9:30 AM   Specimen: Nasopharyngeal  Result Value Ref Range Status   MRSA by PCR NEGATIVE NEGATIVE Final    Comment:  The GeneXpert MRSA Assay (FDA approved for NASAL specimens only), is one component of a comprehensive MRSA colonization surveillance program. It is not intended to diagnose MRSA infection nor to guide or monitor treatment for MRSA infections. Performed at [facility], [address] [place], [place] [address]      Labs: Basic Metabolic Panel: Recent Labs  Lab 09/09/19 0839 09/09/19 2117 09/10/19 0125  NA 136  --  134*  K 4.8  --  5.0  CL 95*  --  95*  CO2 24  --  23  GLUCOSE 145*  --  132*  BUN 50*  --  60*  CREATININE 6.70*  --  7.07*  CALCIUM 9.2  --  8.7*  MG  --  1.6*  --    Liver Function Tests: Recent Labs  Lab 09/09/19 0839 09/10/19 0125  AST 23 24  ALT 14 17  ALKPHOS 48 43  BILITOT 3.2* 2.4*  PROT 7.2 6.2*  ALBUMIN 3.7 3.4*   No results for input(s): LIPASE, AMYLASE in the last 168 hours. Recent Labs  Lab 09/10/19 0105  AMMONIA 12   CBC: Recent Labs  Lab 09/09/19 0839 09/09/19 2117 09/10/19 0425  WBC 13.7* 8.8 12.5*  NEUTROABS 11.3*  --   --   HGB 9.2* 7.8* 8.5*  HCT 27.5* 24.0* 25.7*  MCV 92.3 96.0 94.5  PLT 92* 75* 96*   Cardiac Enzymes: No results for input(s): CKTOTAL,  CKMB, CKMBINDEX, TROPONINI in the last 168 hours. BNP: BNP (last 3 results) No results for input(s): BNP in the last 8760 hours.  ProBNP (last 3 results) No results for input(s): PROBNP in the last 8760 hours.  CBG: Recent Labs  Lab 09/10/19 0104 09/10/19 0423 09/10/19 0847 09/10/19 0926  GLUCAP 114* 127* 115* 119*    Active Problems:   Sepsis ([place])   Time coordinating discharge: 38 minutes.  Signed:        Taimane Stimmel, DO [facility]  09/10/2019, 3:37 PM ADDENDUM: The patient has been reassessed and is appropriate for transfer to [facility], [place].

## 2019-09-11 LAB — URINE CULTURE: Culture: <10000 — AB

## 2019-09-11 LAB — GLUCOSE, CAPILLARY
Glucose-Capillary: 99 mg/dL (ref 70–99)
Glucose-Capillary: 99 mg/dL (ref 70–99)

## 2019-09-11 LAB — LACTIC ACID, PLASMA: Lactic Acid, Venous: 1.5 mmol/L (ref 0.5–1.9)

## 2019-09-11 NOTE — Discharge Instructions (Signed)
Given to receiving RN

## 2019-09-11 NOTE — Progress Notes (Signed)
Left was transferred to [facility] in [facility] by [facility] and [place] RN Carmelina Dane was updated of ETA to [place].

## 2019-09-14 LAB — CULTURE, BLOOD (ROUTINE X 2)
Culture: NO GROWTH
Culture: NO GROWTH
Special Requests: ADEQUATE

## 2020-01-13 ENCOUNTER — Other Ambulatory Visit: Payer: Self-pay

## 2020-01-13 ENCOUNTER — Inpatient Hospital Stay
Admission: EM | Admit: 2020-01-13 | Discharge: 2020-01-17 | DRG: 592 | Disposition: A | Discharge status: Skilled Nursing Facility | Payer: No Typology Code available for payment source | Attending: Internal Medicine | Admitting: Internal Medicine

## 2020-01-13 ENCOUNTER — Emergency Department: Payer: No Typology Code available for payment source

## 2020-01-13 ENCOUNTER — Encounter: Payer: Self-pay | Admitting: Emergency Medicine

## 2020-01-13 DIAGNOSIS — L97922 Non-pressure chronic ulcer of unspecified part of left lower leg with fat layer exposed: Secondary | ICD-10-CM

## 2020-01-13 DIAGNOSIS — I120 Hypertensive chronic kidney disease with stage 5 chronic kidney disease or end stage renal disease: Secondary | ICD-10-CM | POA: Diagnosis present

## 2020-01-13 DIAGNOSIS — B369 Superficial mycosis, unspecified: Secondary | ICD-10-CM

## 2020-01-13 DIAGNOSIS — L89153 Pressure ulcer of sacral region, stage 3: Secondary | ICD-10-CM

## 2020-01-13 DIAGNOSIS — D638 Anemia in other chronic diseases classified elsewhere: Secondary | ICD-10-CM

## 2020-01-13 DIAGNOSIS — L0390 Cellulitis, unspecified: Secondary | ICD-10-CM | POA: Diagnosis present

## 2020-01-13 DIAGNOSIS — R531 Weakness: Secondary | ICD-10-CM | POA: Diagnosis present

## 2020-01-13 DIAGNOSIS — Z89511 Acquired absence of right leg below knee: Secondary | ICD-10-CM

## 2020-01-13 DIAGNOSIS — B356 Tinea cruris: Secondary | ICD-10-CM | POA: Diagnosis present

## 2020-01-13 DIAGNOSIS — E785 Hyperlipidemia, unspecified: Secondary | ICD-10-CM | POA: Diagnosis present

## 2020-01-13 DIAGNOSIS — S31000A Unspecified open wound of lower back and pelvis without penetration into retroperitoneum, initial encounter: Secondary | ICD-10-CM | POA: Diagnosis not present

## 2020-01-13 DIAGNOSIS — Z89612 Acquired absence of left leg above knee: Secondary | ICD-10-CM

## 2020-01-13 DIAGNOSIS — L89323 Pressure ulcer of left buttock, stage 3: Secondary | ICD-10-CM | POA: Diagnosis not present

## 2020-01-13 DIAGNOSIS — I482 Chronic atrial fibrillation, unspecified: Secondary | ICD-10-CM | POA: Diagnosis present

## 2020-01-13 DIAGNOSIS — R5381 Other malaise: Secondary | ICD-10-CM | POA: Diagnosis present

## 2020-01-13 DIAGNOSIS — N186 End stage renal disease: Secondary | ICD-10-CM | POA: Diagnosis present

## 2020-01-13 DIAGNOSIS — Z7901 Long term (current) use of anticoagulants: Secondary | ICD-10-CM

## 2020-01-13 DIAGNOSIS — D631 Anemia in chronic kidney disease: Secondary | ICD-10-CM | POA: Diagnosis present

## 2020-01-13 DIAGNOSIS — D696 Thrombocytopenia, unspecified: Secondary | ICD-10-CM | POA: Diagnosis present

## 2020-01-13 DIAGNOSIS — I9589 Other hypotension: Secondary | ICD-10-CM | POA: Diagnosis present

## 2020-01-13 DIAGNOSIS — Z20822 Contact with and (suspected) exposure to covid-19: Secondary | ICD-10-CM | POA: Diagnosis present

## 2020-01-13 DIAGNOSIS — L89200 Pressure ulcer of unspecified hip, unstageable: Secondary | ICD-10-CM

## 2020-01-13 DIAGNOSIS — L89156 Pressure-induced deep tissue damage of sacral region: Secondary | ICD-10-CM | POA: Diagnosis present

## 2020-01-13 DIAGNOSIS — L8990 Pressure ulcer of unspecified site, unspecified stage: Secondary | ICD-10-CM | POA: Insufficient documentation

## 2020-01-13 DIAGNOSIS — Z5329 Procedure and treatment not carried out because of patient's decision for other reasons: Secondary | ICD-10-CM | POA: Diagnosis not present

## 2020-01-13 DIAGNOSIS — Z9115 Patient's noncompliance with renal dialysis: Secondary | ICD-10-CM

## 2020-01-13 DIAGNOSIS — K439 Ventral hernia without obstruction or gangrene: Secondary | ICD-10-CM | POA: Diagnosis present

## 2020-01-13 DIAGNOSIS — Z9049 Acquired absence of other specified parts of digestive tract: Secondary | ICD-10-CM

## 2020-01-13 DIAGNOSIS — Z79899 Other long term (current) drug therapy: Secondary | ICD-10-CM

## 2020-01-13 DIAGNOSIS — Z992 Dependence on renal dialysis: Secondary | ICD-10-CM

## 2020-01-13 DIAGNOSIS — L89310 Pressure ulcer of right buttock, unstageable: Secondary | ICD-10-CM | POA: Diagnosis present

## 2020-01-13 DIAGNOSIS — G629 Polyneuropathy, unspecified: Secondary | ICD-10-CM | POA: Diagnosis present

## 2020-01-13 DIAGNOSIS — Z7982 Long term (current) use of aspirin: Secondary | ICD-10-CM

## 2020-01-13 DIAGNOSIS — D6869 Other thrombophilia: Secondary | ICD-10-CM

## 2020-01-13 DIAGNOSIS — D6859 Other primary thrombophilia: Secondary | ICD-10-CM | POA: Diagnosis present

## 2020-01-13 DIAGNOSIS — N2581 Secondary hyperparathyroidism of renal origin: Secondary | ICD-10-CM | POA: Diagnosis present

## 2020-01-13 LAB — BASIC METABOLIC PANEL
Anion gap: 17 — ABNORMAL HIGH (ref 5–15)
BUN: 71 mg/dL — ABNORMAL HIGH (ref 8–23)
CO2: 25 mmol/L (ref 22–32)
Calcium: 9.5 mg/dL (ref 8.9–10.3)
Chloride: 97 mmol/L — ABNORMAL LOW (ref 98–111)
Creatinine, Ser: 8.46 mg/dL — ABNORMAL HIGH (ref 0.61–1.24)
GFR, Estimated: 6 mL/min — ABNORMAL LOW (ref >60)
Glucose, Bld: 103 mg/dL — ABNORMAL HIGH (ref 70–99)
Potassium: 4.3 mmol/L (ref 3.5–5.1)
Sodium: 139 mmol/L (ref 135–145)

## 2020-01-13 LAB — CBC
HCT: 32 % — ABNORMAL LOW (ref 39.0–52.0)
Hemoglobin: 10 g/dL — ABNORMAL LOW (ref 13.0–17.0)
MCH: 28.4 pg (ref 26.0–34.0)
MCHC: 31.3 g/dL (ref 30.0–36.0)
MCV: 90.9 fL (ref 80.0–100.0)
Platelets: 160 10*3/uL (ref 150–400)
RBC: 3.52 MIL/uL — ABNORMAL LOW (ref 4.22–5.81)
RDW: 17.7 % — ABNORMAL HIGH (ref 11.5–15.5)
WBC: 6.7 10*3/uL (ref 4.0–10.5)
nRBC: 0 % (ref 0.0–0.2)

## 2020-01-13 LAB — DIFFERENTIAL
Abs Immature Granulocytes: 0.02 10*3/uL (ref 0.00–0.07)
Basophils Absolute: 0 10*3/uL (ref 0.0–0.1)
Basophils Relative: 0 %
Eosinophils Absolute: 0.2 10*3/uL (ref 0.0–0.5)
Eosinophils Relative: 3 %
Immature Granulocytes: 0 %
Lymphocytes Relative: 27 %
Lymphs Abs: 1.8 10*3/uL (ref 0.7–4.0)
Monocytes Absolute: 0.5 10*3/uL (ref 0.1–1.0)
Monocytes Relative: 8 %
Neutro Abs: 4.2 10*3/uL (ref 1.7–7.7)
Neutrophils Relative %: 62 %

## 2020-01-13 LAB — RESPIRATORY PANEL BY RT PCR (FLU A&B, COVID)
Influenza A by PCR: NEGATIVE
Influenza B by PCR: NEGATIVE
SARS Coronavirus 2 by RT PCR: NEGATIVE

## 2020-01-13 LAB — HEPATIC FUNCTION PANEL
ALT: 34 U/L (ref 0–44)
AST: 34 U/L (ref 15–41)
Albumin: 3.7 g/dL (ref 3.5–5.0)
Alkaline Phosphatase: 70 U/L (ref 38–126)
Bilirubin, Direct: 0.4 mg/dL — ABNORMAL HIGH (ref 0.0–0.2)
Indirect Bilirubin: 1 mg/dL — ABNORMAL HIGH (ref 0.3–0.9)
Total Bilirubin: 1.4 mg/dL — ABNORMAL HIGH (ref 0.3–1.2)
Total Protein: 7.1 g/dL (ref 6.5–8.1)

## 2020-01-13 LAB — LACTIC ACID, PLASMA
Lactic Acid, Venous: 1.4 mmol/L (ref 0.5–1.9)
Lactic Acid, Venous: 2 mmol/L (ref 0.5–1.9)

## 2020-01-13 MED ORDER — MIDODRINE HCL 5 MG PO TABS
5.0000 mg | ORAL_TABLET | Freq: Every day | ORAL | Status: DC
  Administered 2020-01-15 – 2020-01-17 (×3): 5 mg via ORAL
  Filled 2020-01-13 (×5): qty 1

## 2020-01-13 MED ORDER — VANCOMYCIN HCL IN DEXTROSE 1-5 GM/200ML-% IV SOLN
1000.0000 mg | INTRAVENOUS | Status: DC
  Filled 2020-01-13: qty 200

## 2020-01-13 MED ORDER — SODIUM CHLORIDE 0.9 % IV SOLN
1.0000 g | INTRAVENOUS | Status: DC
  Administered 2020-01-14: 1 g via INTRAVENOUS
  Filled 2020-01-13 (×2): qty 1

## 2020-01-13 MED ORDER — FINASTERIDE 5 MG PO TABS
5.0000 mg | ORAL_TABLET | Freq: Every day | ORAL | Status: DC
  Administered 2020-01-14 – 2020-01-17 (×4): 5 mg via ORAL
  Filled 2020-01-13 (×4): qty 1

## 2020-01-13 MED ORDER — LOPERAMIDE HCL 2 MG PO CAPS
4.0000 mg | ORAL_CAPSULE | Freq: Four times a day (QID) | ORAL | Status: DC | PRN
  Filled 2020-01-13: qty 2

## 2020-01-13 MED ORDER — EPOETIN ALFA 4000 UNIT/ML IJ SOLN
4000.0000 [IU] | INTRAMUSCULAR | Status: DC
  Administered 2020-01-15 – 2020-01-17 (×2): 4000 [IU] via INTRAVENOUS
  Filled 2020-01-13 (×2): qty 1

## 2020-01-13 MED ORDER — LACTULOSE 10 GM/15ML PO SOLN: 10.0000 g | Freq: Two times a day (BID) | ORAL | Status: DC | PRN

## 2020-01-13 MED ORDER — SODIUM CHLORIDE 0.9 % IV SOLN
2.0000 g | Freq: Once | INTRAVENOUS | Status: AC
  Administered 2020-01-13: 2 g via INTRAVENOUS
  Filled 2020-01-13: qty 20

## 2020-01-13 MED ORDER — VANCOMYCIN HCL 2000 MG/400ML IV SOLN
2000.0000 mg | Freq: Once | INTRAVENOUS | Status: AC
  Administered 2020-01-13: 2000 mg via INTRAVENOUS
  Filled 2020-01-13 (×2): qty 400

## 2020-01-13 MED ORDER — METOPROLOL SUCCINATE ER 50 MG PO TB24
50.0000 mg | ORAL_TABLET | Freq: Every day | ORAL | Status: DC
  Administered 2020-01-14: 50 mg via ORAL
  Filled 2020-01-13 (×4): qty 1

## 2020-01-13 MED ORDER — SEVELAMER CARBONATE 800 MG PO TABS
800.0000 mg | ORAL_TABLET | Freq: Three times a day (TID) | ORAL | Status: DC
  Administered 2020-01-14 – 2020-01-17 (×8): 800 mg via ORAL
  Filled 2020-01-13 (×14): qty 1

## 2020-01-13 MED ORDER — CHLORHEXIDINE GLUCONATE CLOTH 2 % EX PADS
6.0000 | MEDICATED_PAD | Freq: Every day | CUTANEOUS | Status: DC
  Administered 2020-01-16: 6 via TOPICAL
  Filled 2020-01-13: qty 6

## 2020-01-13 MED ORDER — ASPIRIN EC 81 MG PO TBEC
81.0000 mg | DELAYED_RELEASE_TABLET | Freq: Every day | ORAL | Status: DC
  Administered 2020-01-14: 81 mg via ORAL
  Filled 2020-01-13: qty 1

## 2020-01-13 MED ORDER — SODIUM CHLORIDE 0.9 % IV BOLUS
1000.0000 mL | Freq: Once | INTRAVENOUS | Status: AC
  Administered 2020-01-13: 1000 mL via INTRAVENOUS

## 2020-01-13 MED ORDER — PREGABALIN 75 MG PO CAPS
75.0000 mg | ORAL_CAPSULE | Freq: Every day | ORAL | Status: DC
  Administered 2020-01-14 – 2020-01-16 (×4): 75 mg via ORAL
  Filled 2020-01-13 (×4): qty 1

## 2020-01-13 MED ORDER — ROPINIROLE HCL 1 MG PO TABS
1.0000 mg | ORAL_TABLET | Freq: Every day | ORAL | Status: DC
  Administered 2020-01-14 – 2020-01-16 (×4): 1 mg via ORAL
  Filled 2020-01-13 (×4): qty 1

## 2020-01-13 MED ORDER — COLLAGENASE 250 UNIT/GM EX OINT
TOPICAL_OINTMENT | Freq: Every day | CUTANEOUS | Status: DC
  Filled 2020-01-13 (×2): qty 30

## 2020-01-13 MED ORDER — ATORVASTATIN CALCIUM 20 MG PO TABS
40.0000 mg | ORAL_TABLET | Freq: Every day | ORAL | Status: DC
  Administered 2020-01-14 – 2020-01-16 (×3): 40 mg via ORAL
  Filled 2020-01-13 (×3): qty 2

## 2020-01-13 MED ORDER — APIXABAN 5 MG PO TABS
5.0000 mg | ORAL_TABLET | Freq: Two times a day (BID) | ORAL | Status: DC
  Administered 2020-01-14 – 2020-01-17 (×7): 5 mg via ORAL
  Filled 2020-01-13 (×7): qty 1

## 2020-01-13 NOTE — ED Notes (Signed)
Ivin Booty, employee of [place] transfer center informed of pt refusal to transfer at this time

## 2020-01-13 NOTE — ED Notes (Signed)
PT REFUSED TO GO UPSTAIRS UNTIL FINISHED EATING HIS MEAL ED CHARGE AWARE

## 2020-01-13 NOTE — ED Notes (Signed)
Pt back from xray at this time.

## 2020-01-13 NOTE — ED Notes (Signed)
Report called to [employer] rn charge all questions answered

## 2020-01-13 NOTE — H&P (Addendum)
History and Physical   Antonio Phillips TWK:[id number] DOB: 1946-01-11 DOA: 01/13/2020  PCP: Marsh Dolly, MD  Outpatient Specialists: [facility] patient/Hemodialysis through [place] Patient coming from: home (motel)  I have personally briefly reviewed patient's old medical records in [place].  Chief Concern: weakness and missed HD session  HPI: Antonio Phillips is a 74 y.o. male with medical history significant for end-stage renal disease on hemodialysis, hypertension, hyperlipidemia, bilateral AKA, atrial fibrillation on apixaban, neuropathy, presented to the emergency department via EMS for chief concerns of weakness and missed dialysis session with bilateral stump wounds.  He reports that he gets [place] transportation to HD, but he was not feeling well on Saturday, laying on the floor naked and missed HD on Saturday, 01/11/20. He endorses that multiple centers called him including dialysis representative, [place], hotel manager was aware that he missed the dialysis session and knew that he was not feeling well however it is unclear why patient did not present to the emergency department at that time.  Social history: lives by himself in a motel. He has two children in [place], however is estranged from them. His daughter resides with second ex-wife. Denies tobacco, etoh, and recreational drug use.  Chart review: Patient was admitted on 09/08/2019 and transferred to [facility] on 09/10/2019. He presented to the ED for a fall from his bed after not feeling well for 3 days. Working diagnosis prior to transfer included sepsis, concerns for infected left stump, end-stage renal disease on hemodialysis, HAGMA.  ED Course: Discussed with ED provider, admit to observation for hemodialysis and wound care  Review of Systems: As per HPI otherwise 10 point review of systems negative.  Assessment/Plan  Active Problems:   Sacral wound   Pressure injury of skin   Weakness   Weakness-etiology includes multifactorial including  chronic lower extremity leg wounds on IV antibiotics scheduled for dialysis session, chronic debility, missed dialysis session -Checking TSH and phosphorus level -Blood cultures ordered  End-stage renal disease on hemodialysis-nephrology is aware, plan for hemodialysis on 01/14/2020 - AV graft on left upper extremity  ABX with dialysis -Resumed -A.m. team to decipher VA diagnosis and treatment plan for ABX  Sacral wound-clean dressing by nursing staff -Wound care consult  Bilateral stump wounds-present on admission -Checking ESR and CRP -Status post clean dressing placed by nursing staff -Wound care consult placed  Anemia of chronic disease-at baseline  Ventral hernia -reducible, no clinical exam suspicion for strangulation or incarceration  DVT prophylaxis: Eliquis twice daily Code Status: Full code Diet: Renal/cardiac Family Communication: he did not want anyone called to be updated that he is in the hospital Disposition Plan: Pending clinical course Consults called: Nephrology and wound Admission status: Observation  Past Medical History:  Diagnosis Date  . Atrial fibrillation ([place])   . Diabetes ([place])    Pt states that he has no hx of diabetes  . ESRD (end stage renal disease) [facility])    Past Surgical History:  Procedure Laterality Date  . AV FISTULA REPAIR    . BELOW KNEE LEG AMPUTATION Bilateral   . CHOLECYSTECTOMY    . TOTAL HIP ARTHROPLASTY Bilateral    Social History:  reports that he has never smoked. He has never used smokeless tobacco. He reports that he does not drink alcohol and does not use drugs.  No Known Allergies No family history on file. Family history: Family history reviewed and kidney problems in grandmother. He does not know his parents.  Prior to Admission medications  Medication Sig Start Date End Date Taking? Authorizing Provider  apixaban (ELIQUIS) 5 MG TABS tablet Take 5 mg by mouth 2 (two) times daily.   Yes [provider]   aspirin EC 81 MG tablet Take 81 mg by mouth daily.   Yes [provider]  atorvastatin (LIPITOR) 20 MG tablet Take 40 mg by mouth daily.  03/13/19  Yes [provider]  cholecalciferol (VITAMIN D) 25 MCG (1000 UNIT) tablet Take 2,000 Units by mouth daily.   Yes [provider]  lactulose (CHRONULAC) 10 GM/15ML solution Take 10 g by mouth 2 (two) times daily as needed for mild constipation.   Yes [provider]  lidocaine (LIDODERM) 5 % Place 1 patch onto the skin daily. Remove & Discard patch within 12 hours or as directed by MD 09/11/19  Yes Swayze, Ava, DO  metoprolol succinate (TOPROL-XL) 100 MG 24 hr tablet Take 50 mg by mouth daily. Take with or immediately following a meal.   Yes [provider]  midodrine (PROAMATINE) 5 MG tablet Take 5 mg by mouth daily at 6 (six) AM.    Yes [provider]  pregabalin (LYRICA) 75 MG capsule Take 75 mg by mouth at bedtime.  11/02/17  Yes [provider]  rOPINIRole (REQUIP) 1 MG tablet Take 1 mg by mouth at bedtime. 03/13/19  Yes [provider]  sevelamer carbonate (RENVELA) 800 MG tablet Take 800 mg by mouth 3 (three) times daily with meals.   Yes [provider]  vitamin B-12 (CYANOCOBALAMIN) 1000 MCG tablet Take 1,000 mcg by mouth daily.   Yes [provider]  calcitRIOL (ROCALTROL) 0.25 MCG capsule Take 0.25 mcg by mouth daily.    [provider]  carboxymethylcellulose (REFRESH PLUS) 0.5 % SOLN Apply 1 drop to eye every 8 (eight) hours as needed. 02/19/18   [provider]  ceFEPIme 1 g in sodium chloride 0.9 % 100 mL Inject 1 g into the vein daily. 09/11/19   Swayze, Ava, DO  finasteride (PROSCAR) 5 MG tablet Take 5 mg by mouth daily. Patient not taking: Reported on 01/13/2020 03/13/19   [provider]  furosemide (LASIX) 20 MG tablet Take 10-20 mg by mouth daily. Non-dialysis days    [provider]  heparin 5000 UNIT/ML injection  Inject 1 mL (5,000 Units total) into the skin every 8 (eight) hours. 09/10/19   Swayze, Ava, DO  loperamide (IMODIUM A-D) 2 MG tablet Take 2 tablets (4 mg total) by mouth 4 (four) times daily as needed for diarrhea or loose stools. 09/09/19   Carrie Mew, MD  vancomycin [facility]) 1-5 GM/200ML-% SOLN Inject 200 mLs (1,000 mg total) into the vein Every Tuesday,Thursday,and Saturday with dialysis. 09/12/19   Swayze, Ava, DO   Physical Exam: Vitals:   01/13/20 1726 01/13/20 1829 01/13/20 2011 01/13/20 2236  BP: 110/71 104/65 (!) 98/57 102/66  Pulse: 78 87 77 94  Resp: 14 19  16   Temp:  (!) 97.5 F (36.4 C) (!) 97.5 F (36.4 C) (!) 97.5 F (36.4 C)  TempSrc:  Axillary    SpO2: 100% 100% 95% 93%  Weight:      Height:       Constitutional: appears chronically ill, NAD, calm, comfortable Eyes: PERRL, lids and conjunctivae normal ENMT: Mucous membranes are moist. Posterior pharynx clear of any exudate or lesions. Age-appropriate dentition. Hearing appropriate Neck: normal, supple, no masses, no thyromegaly Respiratory: clear to auscultation bilaterally, no wheezing, no crackles. Normal respiratory effort. No  accessory muscle use.  Cardiovascular: Regular rate and rhythm, no murmurs / rubs / gallops. No extremity edema. 2+ pedal pulses. No carotid bruits.  Abdomen: ventral abdominal hernia, no masses palpated, no hepatosplenomegaly. Bowel sounds positive.  Musculoskeletal: left upper extremity AV graft with appropriate bruit. no clubbing / cyanosis. deformity lower extremities. Good ROM, no contractures, no atrophy. Normal muscle tone. Bilateral aka.  Skin: multiple skin wounds in bilateral stump. Sacral ulcer present Neurologic: CN 2-12 grossly intact. Sensation intact. Strength 5/5 in b/l upper extremities  Psychiatric: Normal judgment and insight. Alert and oriented x 3. Normal mood.   Imaging on Admission: Personally reviewed and I agree with radiologist reading as below.  DG  Sacrum/Coccyx  Result Date: 01/13/2020 CLINICAL DATA:  Sacral wounds. EXAM: SACRUM AND COCCYX - 2+ VIEW COMPARISON:  None. FINDINGS: Bones are diffusely demineralized which limits assessment for osteomyelitis. Patient is status post bilateral hip replacement. No gross sacral destruction evident on this limited study. Symphysis pubis unremarkable. IMPRESSION: 1. Limited study due to bony demineralization. No gross sacral destruction evident on this limited study. 2. Status post bilateral hip replacement. Electronically Signed   By: Misty Stanley M.D.   On: 01/13/2020 13:45   DG Femur Min 2 Views Left  Result Date: 01/13/2020 CLINICAL DATA:  Pain EXAM: LEFT FEMUR 2 VIEWS COMPARISON:  None. FINDINGS: Frontal and lateral views obtained. Patient is status post amputation at the distal femur level. Stump region appears unremarkable without air or distal cortical irregularity. Status post total hip replacement on the left prosthetic components well-seated. No acute fracture or dislocation. Extensive arterial vascular calcification noted. Bones are osteoporotic. IMPRESSION: Status post amputation distally with normal appearing stump region. No fracture or dislocation. Total hip replacement with prosthetic components well-seated. Extensive arterial vascular calcification noted. Osteoporosis. Electronically Signed   By: Lowella Grip III M.D.   On: 01/13/2020 13:43   Labs on Admission: I have personally reviewed following labs  CBC: Recent Labs  Lab 01/13/20 1154  WBC 6.7  NEUTROABS 4.2  HGB 10.0*  HCT 32.0*  MCV 90.9  PLT 078   Basic Metabolic Panel: Recent Labs  Lab 01/13/20 1154  NA 139  K 4.3  CL 97*  CO2 25  GLUCOSE 103*  BUN 71*  CREATININE 8.46*  CALCIUM 9.5   GFR: Estimated Creatinine Clearance: 9.1 mL/min (A) (by C-G formula based on SCr of 8.46 mg/dL (H)).  Liver Function Tests: Recent Labs  Lab 01/13/20 1154  AST 34  ALT 34  ALKPHOS 70  BILITOT 1.4*  PROT 7.1   ALBUMIN 3.7   Urine analysis:    Component Value Date/Time   COLORURINE YELLOW (A) 09/09/2019 1654   APPEARANCEUR HAZY (A) 09/09/2019 1654   APPEARANCEUR Hazy 11/14/2012 2044   LABSPEC 1.007 09/09/2019 1654   LABSPEC 1.016 11/14/2012 2044   PHURINE 9.0 (H) 09/09/2019 1654   GLUCOSEU >=500 (A) 09/09/2019 1654   GLUCOSEU 150 mg/dL 11/14/2012 2044   HGBUR NEGATIVE 09/09/2019 1654   [place] 09/09/2019 1654   BILIRUBINUR Negative 11/14/2012 2044   KETONESUR NEGATIVE 09/09/2019 1654   PROTEINUR 100 (A) 09/09/2019 1654   NITRITE NEGATIVE 09/09/2019 1654   LEUKOCYTESUR NEGATIVE 09/09/2019 1654   LEUKOCYTESUR Trace 11/14/2012 2044   Yoshiye Kraft N Tatijana Bierly D.O. [facility]  If 12AM-7AM, please contact overnight-coverage provider If 7AM-7PM, please contact day coverage provider [email or web address]  01/13/2020, 11:24 PM

## 2020-01-13 NOTE — Consult Note (Addendum)
WOC Nurse Consult Note: Reason for Consult: Consult requested for left stump and bilat buttocks.   Wound type: Left stump is currently wrapped and dressing was just changed by the ED nurse.  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  Pt states he spent many hours in a wheelchair prior to admission. Bilat butocks and sacrum are red moist an macerated with partial thickness skin loss; appearance is consistent with moisture associated skin damage approx 10X10cm Upper sacrum with dark reddish purple deep tissue pressure injury; approx 6X6cm Left buttock with stage 3 pressure injury; 3X3X.2cm, red and moist Right buttock with patchy areas of Unstageable pressure injury; approx 4X4cm, tightly adhered yellow slough Pressure injuries were present on admission Dressing procedure/placement/frequency: Order air mattress to reduce pressure Topical treatment orders provided for bedside nurses to perform as follows: Apply Santyl to left buttock Q day and cover with moist 2X2 and foam dressing.  (Change foam dressing Q 3 days or PRN soiling. Apply Aquacel to left stump wound Q day, then cover with ABD pad and kerlex.  Moisten previous dressing with NS to assist with removal Foam dressing to sacrum/bilat buttocks; change Q 3 days or PRN soiling. Please re-consult if further assistance is needed.  Thank-you,  Julien Girt MSN, [place], [place], [place], [place]

## 2020-01-13 NOTE — ED Notes (Signed)
This RN, sam RN, and EDP Isaacs at bedside at this time. This RN and EDP Isaacs visualized multiple stage 2 and 3 pressure ulcers in the sacral region. Also visualized hole-like abrasion to R hip

## 2020-01-13 NOTE — ED Triage Notes (Signed)
Pt lives at [facility]- home health nurse called ems bc she went to see patient for first time after discharge from [place] 1 week ago and it was noted that pt has multiple sacral wounds and has been sitting in his feces x 2 days. 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

## 2020-01-13 NOTE — ED Notes (Signed)
pharmacy to send up topical ointment for tp

## 2020-01-13 NOTE — ED Notes (Signed)
Secretary to call and request hospital bed for pt at this time

## 2020-01-13 NOTE — ED Notes (Signed)
Pharmacy called and informed of needing vanc for pt. Per Manuela Schwartz she will send up once ready

## 2020-01-13 NOTE — ED Provider Notes (Signed)
[facility] Emergency Department Provider Note  ____________________________________________   First MD Initiated Contact with Patient 01/13/20 1124     (approximate)  I have reviewed the triage vital signs and the nursing notes.   HISTORY  Chief Complaint Weakness and Wound Check    HPI Antonio Phillips is a 74 y.o. male with history of end-stage renal disease on dialysis, A. fib, diabetes, here with multiple complaints.  The patient was recently hospitalized at the [place].  He underwent amputation of the left leg, and had a hospital course prolonged by open wounds and difficulty managing wound healing.  He reportedly was discharged last week.  At that time, it was initially recommended that he go to a facility, but he refused.  He states that he has not been able to get up out of the chair since then.  He has missed dialysis.  He states that the wounds on his back have gotten increasingly painful, and are now weeping.  He has noticed some foul smell from them.  He states he has been having bowel movements and been unable to clean himself.  He also states that his left leg is slightly more painful, though he feels like the wounds are similar to how they looked when he was discharged.  He does not recall any specific fevers or chills.  No other complaints.        Past Medical History:  Diagnosis Date  . Atrial fibrillation ([place])   . Diabetes ([place])    Pt states that he has no hx of diabetes  . ESRD (end stage renal disease) [facility])     Patient Active Problem List   Diagnosis Date Noted  . Sacral wound 01/13/2020  . Sepsis ([place]) 09/09/2019  . Hyperkalemia 04/25/2018  . UTI (urinary tract infection) 04/25/2018  . Diabetes ([place]) 04/25/2018  . ESRD on dialysis [facility]) 04/25/2018    Past Surgical History:  Procedure Laterality Date  . AV FISTULA REPAIR    . BELOW KNEE LEG AMPUTATION Bilateral   . CHOLECYSTECTOMY    . TOTAL HIP ARTHROPLASTY Bilateral      Prior to Admission medications   Medication Sig Start Date End Date Taking? Authorizing Provider  aspirin EC 81 MG tablet Take 81 mg by mouth daily.   Yes [provider]  atorvastatin (LIPITOR) 20 MG tablet Take 40 mg by mouth daily.  03/13/19  Yes [provider]  cholecalciferol (VITAMIN D) 25 MCG (1000 UNIT) tablet Take 2,000 Units by mouth daily.   Yes [provider]  lactulose (CHRONULAC) 10 GM/15ML solution Take 10 g by mouth 2 (two) times daily as needed for mild constipation.   Yes [provider]  lidocaine (LIDODERM) 5 % Place 1 patch onto the skin daily. Remove & Discard patch within 12 hours or as directed by MD 09/11/19  Yes Swayze, Ava, DO  metoprolol succinate (TOPROL-XL) 100 MG 24 hr tablet Take 50 mg by mouth daily. Take with or immediately following a meal.   Yes [provider]  midodrine (PROAMATINE) 5 MG tablet Take 5 mg by mouth daily at 6 (six) AM.    Yes [provider]  pregabalin (LYRICA) 75 MG capsule Take 75 mg by mouth at bedtime.  11/02/17  Yes [provider]  rOPINIRole (REQUIP) 1 MG tablet Take 1 mg by mouth at bedtime. 03/13/19  Yes [provider]  sevelamer carbonate (RENVELA) 800 MG tablet Take 800 mg by mouth 3 (three) times  daily with meals.   Yes [provider]  vitamin B-12 (CYANOCOBALAMIN) 1000 MCG tablet Take 1,000 mcg by mouth daily.   Yes [provider]  apixaban (ELIQUIS) 5 MG TABS tablet Take 5 mg by mouth 2 (two) times daily.    [provider]  calcitRIOL (ROCALTROL) 0.25 MCG capsule Take 0.25 mcg by mouth daily.    [provider]  carboxymethylcellulose (REFRESH PLUS) 0.5 % SOLN Apply 1 drop to eye every 8 (eight) hours as needed. 02/19/18   [provider]  ceFEPIme 1 g in sodium chloride 0.9 % 100 mL Inject 1 g into the vein daily. 09/11/19   Swayze, Ava, DO  finasteride (PROSCAR) 5 MG tablet Take 5 mg by mouth daily. Patient not  taking: Reported on 01/13/2020 03/13/19   [provider]  furosemide (LASIX) 20 MG tablet Take 10-20 mg by mouth daily. Non-dialysis days    [provider]  heparin 5000 UNIT/ML injection Inject 1 mL (5,000 Units total) into the skin every 8 (eight) hours. 09/10/19   Swayze, Ava, DO  loperamide (IMODIUM A-D) 2 MG tablet Take 2 tablets (4 mg total) by mouth 4 (four) times daily as needed for diarrhea or loose stools. 09/09/19   Carrie Mew, MD  vancomycin [facility]) 1-5 GM/200ML-% SOLN Inject 200 mLs (1,000 mg total) into the vein Every Tuesday,Thursday,and Saturday with dialysis. 09/12/19   Swayze, Ava, DO    Allergies Patient has no known allergies.  No family history on file.  Social History Social History   Tobacco Use  . Smoking status: Never Smoker  . Smokeless tobacco: Never Used  Substance Use Topics  . Alcohol use: Never  . Drug use: Never    Review of Systems  Review of Systems  Constitutional: Positive for fatigue. Negative for chills and fever.  HENT: Negative for sore throat.   Respiratory: Negative for shortness of breath.   Cardiovascular: Negative for chest pain.  Gastrointestinal: Negative for abdominal pain.  Genitourinary: Negative for flank pain.  Musculoskeletal: Negative for neck pain.  Skin: Positive for rash and wound.  Allergic/Immunologic: Negative for immunocompromised state.  Neurological: Positive for weakness. Negative for numbness.  Hematological: Does not bruise/bleed easily.  All other systems reviewed and are negative.    ____________________________________________  PHYSICAL EXAM:      VITAL SIGNS: ED Triage Vitals  Enc Vitals Group     BP 01/13/20 1123 (!) 92/50     Pulse Rate 01/13/20 1123 (!) 58     Resp 01/13/20 1123 18     Temp 01/13/20 1123 (!) 97.3 F (36.3 C)     Temp Source 01/13/20 1123 Oral     SpO2 01/13/20 1123 97 %     Weight 01/13/20 1206 220 lb (99.8 kg)     Height 01/13/20 1206 5\' 11"  (1.803 m)      Head Circumference --      Peak Flow --      Pain Score 01/13/20 1134 0     Pain Loc --      Pain Edu? --      Excl. in [place]? --      Physical Exam Vitals and nursing note reviewed.  Constitutional:      General: He is not in acute distress.    Appearance: He is well-developed.  HENT:     Head: Normocephalic and atraumatic.  Eyes:     Conjunctiva/sclera: Conjunctivae normal.  Cardiovascular:     Rate and Rhythm: Normal rate  and regular rhythm.     Heart sounds: Normal heart sounds.  Pulmonary:     Effort: Pulmonary effort is normal. No respiratory distress.     Breath sounds: No wheezing.  Abdominal:     General: There is no distension.  Musculoskeletal:     Cervical back: Neck supple.  Skin:    General: Skin is warm.     Capillary Refill: Capillary refill takes less than 2 seconds.     Findings: No rash.     Comments: Stage III decub ulcers along sacral area with significant redness, warmth, and tenderness. Left leg with significant open wound, granulation tissue, and small amount of drainage.  Neurological:     Mental Status: He is alert and oriented to person, place, and time.     Motor: No abnormal muscle tone.         ____________________________________________   LABS (all labs ordered are listed, but only abnormal results are displayed)  Labs Reviewed  BASIC METABOLIC PANEL - Abnormal; Notable for the following components:      Result Value   Chloride 97 (*)    Glucose, Bld 103 (*)    BUN 71 (*)    Creatinine, Ser 8.46 (*)    GFR, Estimated 6 (*)    Anion gap 17 (*)    All other components within normal limits  CBC - Abnormal; Notable for the following components:   RBC 3.52 (*)    Hemoglobin 10.0 (*)    HCT 32.0 (*)    RDW 17.7 (*)    All other components within normal limits  LACTIC ACID, PLASMA - Abnormal; Notable for the following components:   Lactic Acid, Venous 2.0 (*)    All other components within normal limits  HEPATIC FUNCTION  PANEL - Abnormal; Notable for the following components:   Total Bilirubin 1.4 (*)    Bilirubin, Direct 0.4 (*)    Indirect Bilirubin 1.0 (*)    All other components within normal limits  RESPIRATORY PANEL BY RT PCR (FLU A&B, COVID)  CULTURE, BLOOD (ROUTINE X 2)  CULTURE, BLOOD (ROUTINE X 2)  LACTIC ACID, PLASMA  DIFFERENTIAL  URINALYSIS, COMPLETE (UACMP) WITH MICROSCOPIC  CBG MONITORING, ED    ____________________________________________  EKG:  ________________________________________  RADIOLOGY All imaging, including plain films, CT scans, and ultrasounds, independently reviewed by me, and interpretations confirmed via formal radiology reads.  ED MD interpretation:   DG Sacrum: No significant bony destruction XR Femur: S/p amputation, normal appearing stump region  Official radiology report(s): DG Sacrum/Coccyx  Result Date: 01/13/2020 CLINICAL DATA:  Sacral wounds. EXAM: SACRUM AND COCCYX - 2+ VIEW COMPARISON:  None. FINDINGS: Bones are diffusely demineralized which limits assessment for osteomyelitis. Patient is status post bilateral hip replacement. No gross sacral destruction evident on this limited study. Symphysis pubis unremarkable. IMPRESSION: 1. Limited study due to bony demineralization. No gross sacral destruction evident on this limited study. 2. Status post bilateral hip replacement. Electronically Signed   By: Misty Stanley M.D.   On: 01/13/2020 13:45   DG Femur Min 2 Views Left  Result Date: 01/13/2020 CLINICAL DATA:  Pain EXAM: LEFT FEMUR 2 VIEWS COMPARISON:  None. FINDINGS: Frontal and lateral views obtained. Patient is status post amputation at the distal femur level. Stump region appears unremarkable without air or distal cortical irregularity. Status post total hip replacement on the left prosthetic components well-seated. No acute fracture or dislocation. Extensive arterial vascular calcification noted. Bones are osteoporotic. IMPRESSION: Status post amputation  distally with normal appearing stump region. No fracture or dislocation. Total hip replacement with prosthetic components well-seated. Extensive arterial vascular calcification noted. Osteoporosis. Electronically Signed   By: Lowella Grip III M.D.   On: 01/13/2020 13:43    ____________________________________________  PROCEDURES   Procedure(s) performed (including Critical Care):  Procedures  ____________________________________________  INITIAL IMPRESSION / MDM / [place] / ED COURSE  As part of my medical decision making, I reviewed the following data within the [place] notes reviewed and incorporated, Old chart reviewed, Notes from prior ED visits, and [place] Controlled Substance Database       *Antonio Phillips was evaluated in Emergency Department on 01/13/2020 for the symptoms described in the history of present illness. He was evaluated in the context of the global COVID-19 pandemic, which necessitated consideration that the patient might be at risk for infection with the SARS-CoV-2 virus that causes COVID-19. Institutional protocols and algorithms that pertain to the evaluation of patients at risk for COVID-19 are in a state of rapid change based on information released by regulatory bodies including the CDC and federal and state organizations. These policies and algorithms were followed during the patient's care in the ED.  Some ED evaluations and interventions may be delayed as a result of limited staffing during the pandemic.*     Medical Decision Making:  74 yo M here with worsening rash/pain in sacral area, after being unable to clean himself/stand up for past week. S/p recent hospitalization at [facility]. Clinically, pt has significant, erythematous sacral wounds concerning for worsening decub with possible superinfection. He is mildly hypotensive here but otherwise non-toxic appearing. He also has a large, open woudn to his stump though this  appears to be healing. Labs are c/w his ESRD. No signs of lactic acidosis/severe sepsis. Given his significant, increasingly painful decub wounds, inability to care for self, will start empiric abx and admit for wound care. Now amenable to SNF. Refused transport to [place] so will admit here. ____________________________________________  FINAL CLINICAL IMPRESSION(S) / ED DIAGNOSES  Final diagnoses:  Pressure injury of sacral region, stage 3 ([place])     MEDICATIONS GIVEN DURING THIS VISIT:  Medications  collagenase (SANTYL) ointment ( Topical Given 01/13/20 1639)  Chlorhexidine Gluconate Cloth 2 % PADS 6 each (has no administration in time range)  epoetin alfa (EPOGEN) injection 4,000 Units (has no administration in time range)  sodium chloride 0.9 % bolus 1,000 mL (0 mLs Intravenous Stopped 01/13/20 1530)  cefTRIAXone (ROCEPHIN) 2 g in sodium chloride 0.9 % 100 mL IVPB (0 g Intravenous Stopped 01/13/20 1530)  vancomycin (VANCOREADY) IVPB 2000 mg/400 mL (2,000 mg Intravenous New Bag/Given 01/13/20 1638)     ED Discharge Orders    None       Note:  This document was prepared using Dragon voice recognition software and may include unintentional dictation errors.   Duffy Bruce, MD 01/13/20 2042

## 2020-01-13 NOTE — ED Notes (Addendum)
Pt left wound cleaned by MD Ellender Hose and dressed by this RN. Xeroform placed over site with wet to dress dressing and then wrapped with gauze.  Wound RN at bedside and placed duoderm on pts sacral area and viewed sites. Clean chux placed and pt pulled up in bed. Pt resting at this time.  Pt given urinal for urine collection

## 2020-01-13 NOTE — ED Notes (Signed)
Report called attempted was told there was a security issue and the room was not approved , was told floor charge will call

## 2020-01-13 NOTE — ED Notes (Signed)
Pt given drink and meal tray.

## 2020-01-13 NOTE — ED Notes (Signed)
Patient transported to X-ray 

## 2020-01-13 NOTE — ED Triage Notes (Addendum)
Pt in w/generalized wkns, has pain to multiple sacral wounds. See triage note for background info. L AKA done at [facility] 54mo's ago. Afebrile in triage 97.3, BP 92/50

## 2020-01-13 NOTE — Progress Notes (Signed)
PHARMACY -  BRIEF ANTIBIOTIC NOTE   Pharmacy has received consult(s) for Vancomycin from an ED provider.  The patient's profile has been reviewed for ht/wt/allergies/indication/available labs.    One time order(s) placed for Vancomycin 2000mg    Further antibiotics/pharmacy consults should be ordered by admitting physician if indicated.                       Thank you, Pernell Dupre, PharmD, BCPS Clinical Pharmacist 01/13/2020 1:18 PM

## 2020-01-13 NOTE — Progress Notes (Signed)
[facility], [place] 01/13/20  Subjective:   LOS: 0  Patient known to our practice from outpatient HD Last HD was on Thursday States he missed treatment on Saturday because he was "stuck" in his wheelchair.  States he has not had anything to eat or drink since Saturday.  He lives in a [employer].  EMS was called by the home health nurse who found him sitting in his feces for the past couple of days.  Nephrology consult has been requested for evaluation for dialysis He was able to eat a sandwich without nausea or vomiting.  Received fluid bolus in the emergency room  Objective:  Vital signs in last 24 hours:  Temp:  [97.3 F (36.3 C)] 97.3 F (36.3 C) (11/08 1123) Pulse Rate:  [58-105] 85 (11/08 1445) Resp:  [13-20] 15 (11/08 1445) BP: (92-106)/(47-80) 93/76 (11/08 1445) SpO2:  [89 %-100 %] 100 % (11/08 1445) Weight:  [99.8 kg] 99.8 kg (11/08 1206)  Weight change:  Filed Weights   01/13/20 1206  Weight: 99.8 kg    Intake/Output:   No intake or output data in the 24 hours ending 01/13/20 1511   Physical Exam: General:  No acute distress, lying in the bed  HEENT  moist oral mucous membranes  Pulm/lungs  normal breathing effort room air, clear to auscultation  CVS/Heart  no rub  Abdomen:   Soft, nontender  Extremities:  Bilateral amputation stump bandaged  Neurologic:  Alert, oriented  Skin:  Warm, dry  Access:  Left arm AV fistula       Basic Metabolic Panel:  Recent Labs  Lab 01/13/20 1154  NA 139  K 4.3  CL 97*  CO2 25  GLUCOSE 103*  BUN 71*  CREATININE 8.46*  CALCIUM 9.5     CBC: Recent Labs  Lab 01/13/20 1154  WBC 6.7  NEUTROABS 4.2  HGB 10.0*  HCT 32.0*  MCV 90.9  PLT 160      Lab Results  Component Value Date   HEPBSAG Negative 11/09/2018   HEPBIGM Negative 11/12/2018      Microbiology:  No results found for this or any previous visit (from the past 240 hour(s)).  Coagulation Studies: No results  for input(s): LABPROT, INR in the last 72 hours.  Urinalysis: No results for input(s): COLORURINE, LABSPEC, PHURINE, GLUCOSEU, HGBUR, BILIRUBINUR, KETONESUR, PROTEINUR, UROBILINOGEN, NITRITE, LEUKOCYTESUR in the last 72 hours.  Invalid input(s): APPERANCEUR    Imaging: DG Sacrum/Coccyx  Result Date: 01/13/2020 CLINICAL DATA:  Sacral wounds. EXAM: SACRUM AND COCCYX - 2+ VIEW COMPARISON:  None. FINDINGS: Bones are diffusely demineralized which limits assessment for osteomyelitis. Patient is status post bilateral hip replacement. No gross sacral destruction evident on this limited study. Symphysis pubis unremarkable. IMPRESSION: 1. Limited study due to bony demineralization. No gross sacral destruction evident on this limited study. 2. Status post bilateral hip replacement. Electronically Signed   By: Misty Stanley M.D.   On: 01/13/2020 13:45   DG Femur Min 2 Views Left  Result Date: 01/13/2020 CLINICAL DATA:  Pain EXAM: LEFT FEMUR 2 VIEWS COMPARISON:  None. FINDINGS: Frontal and lateral views obtained. Patient is status post amputation at the distal femur level. Stump region appears unremarkable without air or distal cortical irregularity. Status post total hip replacement on the left prosthetic components well-seated. No acute fracture or dislocation. Extensive arterial vascular calcification noted. Bones are osteoporotic. IMPRESSION: Status post amputation distally with normal appearing stump region. No fracture or dislocation. Total hip replacement  with prosthetic components well-seated. Extensive arterial vascular calcification noted. Osteoporosis. Electronically Signed   By: Lowella Grip III M.D.   On: 01/13/2020 13:43     Medications:   . vancomycin     . collagenase   Topical Daily     Assessment/ Plan:  74 y.o. male with end-stage renal disease left stump and bilateral buttock wounds, h/o hip arthroplasty, A Fib, DM-2 was admitted on 01/13/2020 for sacral wounds and  cellulitis   Active Problems:   * No active hospital problems. *  weakness EMS [facility] [place]/ left arm AVF/ TTS/ 92.5 kg  #. ESRD Plan for dialysis tomorrow Electrolytes and volume status are acceptable.  No acute indication for dialysis at present  #. Anemia of CKD  Lab Results  Component Value Date   HGB 10.0 (L) 01/13/2020   Low dose EPO with HD  #. Secondary hyperparathyroidism of renal origin N 25.81   No results found for: PTH Lab Results  Component Value Date   PHOS 5.6 (H) 11/14/2018   Monitor calcium and phos level during this admission   #.  Sacral wound and wound to the left lower extremity stump -Empiric treatment for cellulitis -Follow wound care recommendations -Management as per hospitalist team    LOS: 0 Kursten Kruk 11/8/20213:11 PM  [place], [place]

## 2020-01-14 DIAGNOSIS — Z9115 Patient's noncompliance with renal dialysis: Secondary | ICD-10-CM | POA: Diagnosis not present

## 2020-01-14 DIAGNOSIS — L89323 Pressure ulcer of left buttock, stage 3: Secondary | ICD-10-CM | POA: Diagnosis present

## 2020-01-14 DIAGNOSIS — M6281 Muscle weakness (generalized): Secondary | ICD-10-CM | POA: Diagnosis not present

## 2020-01-14 DIAGNOSIS — D631 Anemia in chronic kidney disease: Secondary | ICD-10-CM | POA: Diagnosis present

## 2020-01-14 DIAGNOSIS — D6859 Other primary thrombophilia: Secondary | ICD-10-CM | POA: Diagnosis present

## 2020-01-14 DIAGNOSIS — K439 Ventral hernia without obstruction or gangrene: Secondary | ICD-10-CM | POA: Diagnosis present

## 2020-01-14 DIAGNOSIS — L89156 Pressure-induced deep tissue damage of sacral region: Secondary | ICD-10-CM | POA: Diagnosis present

## 2020-01-14 DIAGNOSIS — Z20822 Contact with and (suspected) exposure to covid-19: Secondary | ICD-10-CM | POA: Diagnosis present

## 2020-01-14 DIAGNOSIS — E785 Hyperlipidemia, unspecified: Secondary | ICD-10-CM | POA: Diagnosis present

## 2020-01-14 DIAGNOSIS — B356 Tinea cruris: Secondary | ICD-10-CM | POA: Diagnosis present

## 2020-01-14 DIAGNOSIS — D696 Thrombocytopenia, unspecified: Secondary | ICD-10-CM | POA: Diagnosis present

## 2020-01-14 DIAGNOSIS — Z89612 Acquired absence of left leg above knee: Secondary | ICD-10-CM | POA: Diagnosis not present

## 2020-01-14 DIAGNOSIS — L89153 Pressure ulcer of sacral region, stage 3: Secondary | ICD-10-CM | POA: Diagnosis not present

## 2020-01-14 DIAGNOSIS — R2681 Unsteadiness on feet: Secondary | ICD-10-CM | POA: Diagnosis not present

## 2020-01-14 DIAGNOSIS — L97929 Non-pressure chronic ulcer of unspecified part of left lower leg with unspecified severity: Secondary | ICD-10-CM | POA: Diagnosis not present

## 2020-01-14 DIAGNOSIS — L0390 Cellulitis, unspecified: Secondary | ICD-10-CM | POA: Diagnosis present

## 2020-01-14 DIAGNOSIS — L89310 Pressure ulcer of right buttock, unstageable: Secondary | ICD-10-CM | POA: Diagnosis present

## 2020-01-14 DIAGNOSIS — R5381 Other malaise: Secondary | ICD-10-CM | POA: Diagnosis present

## 2020-01-14 DIAGNOSIS — I120 Hypertensive chronic kidney disease with stage 5 chronic kidney disease or end stage renal disease: Secondary | ICD-10-CM | POA: Diagnosis present

## 2020-01-14 DIAGNOSIS — I482 Chronic atrial fibrillation, unspecified: Secondary | ICD-10-CM | POA: Diagnosis present

## 2020-01-14 DIAGNOSIS — Z7901 Long term (current) use of anticoagulants: Secondary | ICD-10-CM | POA: Diagnosis not present

## 2020-01-14 DIAGNOSIS — R531 Weakness: Secondary | ICD-10-CM | POA: Diagnosis present

## 2020-01-14 DIAGNOSIS — I951 Orthostatic hypotension: Secondary | ICD-10-CM | POA: Diagnosis not present

## 2020-01-14 DIAGNOSIS — Z9049 Acquired absence of other specified parts of digestive tract: Secondary | ICD-10-CM | POA: Diagnosis not present

## 2020-01-14 DIAGNOSIS — L97922 Non-pressure chronic ulcer of unspecified part of left lower leg with fat layer exposed: Secondary | ICD-10-CM | POA: Diagnosis present

## 2020-01-14 DIAGNOSIS — Z992 Dependence on renal dialysis: Secondary | ICD-10-CM | POA: Diagnosis not present

## 2020-01-14 DIAGNOSIS — N2581 Secondary hyperparathyroidism of renal origin: Secondary | ICD-10-CM | POA: Diagnosis present

## 2020-01-14 DIAGNOSIS — D6869 Other thrombophilia: Secondary | ICD-10-CM | POA: Diagnosis not present

## 2020-01-14 DIAGNOSIS — G629 Polyneuropathy, unspecified: Secondary | ICD-10-CM | POA: Diagnosis present

## 2020-01-14 DIAGNOSIS — N186 End stage renal disease: Secondary | ICD-10-CM | POA: Diagnosis not present

## 2020-01-14 LAB — C-REACTIVE PROTEIN: CRP: 9 mg/dL — ABNORMAL HIGH (ref <1.0)

## 2020-01-14 LAB — TSH: TSH: 2.626 u[IU]/mL (ref 0.350–4.500)

## 2020-01-14 LAB — CBC
HCT: 28.2 % — ABNORMAL LOW (ref 39.0–52.0)
Hemoglobin: 9 g/dL — ABNORMAL LOW (ref 13.0–17.0)
MCH: 28.4 pg (ref 26.0–34.0)
MCHC: 31.9 g/dL (ref 30.0–36.0)
MCV: 89 fL (ref 80.0–100.0)
Platelets: 135 K/uL — ABNORMAL LOW (ref 150–400)
RBC: 3.17 MIL/uL — ABNORMAL LOW (ref 4.22–5.81)
RDW: 17.4 % — ABNORMAL HIGH (ref 11.5–15.5)
WBC: 6.6 K/uL (ref 4.0–10.5)
nRBC: 0 % (ref 0.0–0.2)

## 2020-01-14 LAB — PHOSPHORUS: Phosphorus: 7.7 mg/dL — ABNORMAL HIGH (ref 2.5–4.6)

## 2020-01-14 LAB — BASIC METABOLIC PANEL WITH GFR
Anion gap: 19 — ABNORMAL HIGH (ref 5–15)
BUN: 79 mg/dL — ABNORMAL HIGH (ref 8–23)
CO2: 20 mmol/L — ABNORMAL LOW (ref 22–32)
Calcium: 9.1 mg/dL (ref 8.9–10.3)
Chloride: 99 mmol/L (ref 98–111)
Creatinine, Ser: 8.94 mg/dL — ABNORMAL HIGH (ref 0.61–1.24)
GFR, Estimated: 6 mL/min — ABNORMAL LOW
Glucose, Bld: 112 mg/dL — ABNORMAL HIGH (ref 70–99)
Potassium: 4.9 mmol/L (ref 3.5–5.1)
Sodium: 138 mmol/L (ref 135–145)

## 2020-01-14 LAB — HEPATITIS B SURFACE ANTIGEN: Hepatitis B Surface Ag: NONREACTIVE

## 2020-01-14 LAB — SEDIMENTATION RATE: Sed Rate: 38 mm/h — ABNORMAL HIGH (ref 0–20)

## 2020-01-14 MED ORDER — OXYCODONE HCL 5 MG PO TABS
5.0000 mg | ORAL_TABLET | Freq: Once | ORAL | Status: AC | PRN
  Administered 2020-01-14: 5 mg via ORAL
  Filled 2020-01-14: qty 1

## 2020-01-14 NOTE — Progress Notes (Signed)
PT Cancellation Note  Patient Details Name: Antonio Phillips MRN: [id number] DOB: 1945/12/16   Cancelled Treatment:    Reason Eval/Treat Not Completed: Patient at procedure or test/unavailable. Patient in HD- will re-attempt PT eval at later time when patient available.    Reeshemah Nazaryan 01/14/2020, 2:21 PM

## 2020-01-14 NOTE — Plan of Care (Signed)
  Problem: Education: Goal: Knowledge of General Education information will improve Description: Including pain rating scale, medication(s)/side effects and non-pharmacologic comfort measures 01/14/2020 1112 by Feliciana Rossetti, RN Outcome: Progressing 01/14/2020 1112 by Feliciana Rossetti, RN Outcome: Progressing   Problem: Health Behavior/Discharge Planning: Goal: Ability to manage health-related needs will improve 01/14/2020 1112 by Feliciana Rossetti, RN Outcome: Progressing 01/14/2020 1112 by Feliciana Rossetti, RN Outcome: Progressing   Problem: Clinical Measurements: Goal: Ability to maintain clinical measurements within normal limits will improve 01/14/2020 1112 by Feliciana Rossetti, RN Outcome: Progressing 01/14/2020 1112 by Feliciana Rossetti, RN Outcome: Progressing Goal: Will remain free from infection 01/14/2020 1112 by Feliciana Rossetti, RN Outcome: Progressing 01/14/2020 1112 by Feliciana Rossetti, RN Outcome: Progressing

## 2020-01-14 NOTE — Progress Notes (Signed)
Pt came to Dialysis and BP was Low.   Pt was in AFIB and  HR was 120's 130;s/ Dr. Candiss Norse notified he ordered Tx  cancelled   Jeanett Schlein, RN

## 2020-01-14 NOTE — Progress Notes (Signed)
PROGRESS NOTE    [name]  KGY:[id number] DOB: [birth year] DOA: 01/13/2020 PCP: Marsh Dolly, MD     Brief Narrative:  Antonio Phillips is a 74 y.o. male with medical history significant for end-stage renal disease on hemodialysis, hypertension, hyperlipidemia, bilateral LE amputations (left AKA, right BKA), atrial fibrillation on apixaban, neuropathy, who presented to the emergency department via EMS for chief concerns of weakness and missed dialysis session with bilateral stump wounds.  Apparently, patient was feeling unwell on Saturday, laid on the floor of his motel room and missed dialysis.  He states that he was admitted to the [place] in [place] in July and stayed inpatient for 4 months, underwent left AKA, wound vac placement (discontinued about 1 month ago), IV antibiotics.  He was released November 1. He states that he had completed antibiotics while in [place]. He was recommended for SNF but declined and wanted to see how he did on his own.   New events last 24 hours / Subjective: His main complaint is hunger.  Awaiting hemodialysis today.  Assessment & Plan:   Active Problems:   ESRD on dialysis ([place])   Sacral wound   Pressure injury of skin   Weakness   Generalized weakness -Had been hospitalized for about 4 months at the [facility] and was recommended for SNF placement after his AKA.  He declined and wanted to see how he did at home.  Now agreeable for SNF placement -PT OT TOC consult  ESRD on hemodialysis -Per nephrology  Left AKA stump wound -Patient states that this was treated with wound VAC, IV antibiotics while at [facility].  Antibiotics have been discontinued prior to discharge -Obtain medical records from [place] care -Does not appear to be actively infected at this time, wound present with granulation tissue without purulent drainage.  He has no fever, normal WBC.  Monitor off antibiotics  Chronic hypotension -Continue midodrine  Paroxysmal atrial  fibrillation -Continue Toprol, Eliquis  Hyperlipidemia -Continue Lipitor    In agreement with assessment of the pressure ulcer as below:  Pressure Injury 01/13/20 Sacrum Mid;Upper Deep Tissue Pressure Injury - Purple or maroon localized area of discolored intact skin or blood-filled blister due to damage of underlying soft tissue from pressure and/or shear. (Active)  01/13/20   Location: Sacrum  Location Orientation: Mid;Upper  Staging: Deep Tissue Pressure Injury - Purple or maroon localized area of discolored intact skin or blood-filled blister due to damage of underlying soft tissue from pressure and/or shear.  Wound Description (Comments):   Present on Admission: Yes     Pressure Injury 01/13/20 Buttocks Left Stage 3 -  Full thickness tissue loss. Subcutaneous fat may be visible but bone, tendon or muscle are NOT exposed. (Active)  01/13/20   Location: Buttocks  Location Orientation: Left  Staging: Stage 3 -  Full thickness tissue loss. Subcutaneous fat may be visible but bone, tendon or muscle are NOT exposed.  Wound Description (Comments):   Present on Admission: Yes     Pressure Injury 01/13/20 Buttocks Right Unstageable - Full thickness tissue loss in which the base of the injury is covered by slough (yellow, tan, gray, green or brown) and/or eschar (tan, brown or black) in the wound bed. (Active)  01/13/20   Location: Buttocks  Location Orientation: Right  Staging: Unstageable - Full thickness tissue loss in which the base of the injury is covered by slough (yellow, tan, gray, green or brown) and/or eschar (tan, brown or black) in the wound  bed.  Wound Description (Comments):   Present on Admission: Yes         DVT prophylaxis:   apixaban (ELIQUIS) tablet 5 mg  Code Status: Full code Family Communication: No family at bedside Disposition Plan:  Status is: Observation  The patient will require care spanning > 2 midnights and should be moved to inpatient because:  Unsafe d/c plan  Dispo: The patient is from: Home              Anticipated d/c is to: SNF              Anticipated d/c date is: 1 day              Patient currently is not medically stable to d/c.  Undergo hemodialysis as he had missed dialysis over the weekend.  Need PT OT evaluation and likely SNF placement.      Consultants:   Nephrology  Procedures:   None  Antimicrobials:  Anti-infectives (From admission, onward)   Start     Dose/Rate Route Frequency Ordered Stop   01/14/20 1200  vancomycin (VANCOCIN) IVPB 1000 mg/200 mL premix        1,000 mg 200 mL/hr over 60 Minutes Intravenous Every T-Th-Sa (Hemodialysis) 01/13/20 2324     01/14/20 1000  ceFEPIme (MAXIPIME) 1 g in sodium chloride 0.9 % 100 mL IVPB        1 g 200 mL/hr over 30 Minutes Intravenous Every 24 hours 01/13/20 2324     01/13/20 1330  vancomycin (VANCOREADY) IVPB 2000 mg/400 mL        2,000 mg 200 mL/hr over 120 Minutes Intravenous  Once 01/13/20 1316 01/13/20 1838   01/13/20 1315  cefTRIAXone (ROCEPHIN) 2 g in sodium chloride 0.9 % 100 mL IVPB        2 g 200 mL/hr over 30 Minutes Intravenous  Once 01/13/20 1301 01/13/20 1530        Objective: Vitals:   01/13/20 2011 01/13/20 2236 01/14/20 0241 01/14/20 0743  BP: (!) 98/57 102/66 103/73 106/81  Pulse: 77 94 91 100  Resp:  16 18 18   Temp: (!) 97.5 F (36.4 C) (!) 97.5 F (36.4 C) 98.5 F (36.9 C) 98.7 F (37.1 C)  TempSrc:    Oral  SpO2: 95% 93% 99% 97%  Weight:      Height:        Intake/Output Summary (Last 24 hours) at 01/14/2020 0935 Last data filed at 01/14/2020 0116 Gross per 24 hour  Intake 1100 ml  Output 200 ml  Net 900 ml   Filed Weights   01/13/20 1206  Weight: 99.8 kg    Examination:  General exam: Appears calm and comfortable  Respiratory system: Clear to auscultation. Respiratory effort normal. No respiratory distress. No conversational dyspnea.  Cardiovascular system: S1 & S2 heard, RRR. No murmurs. No pedal  edema. Gastrointestinal system: Abdomen is nondistended, soft and nontender. Normal bowel sounds heard. Central nervous system: Alert and oriented. No focal neurological deficits. Speech clear.  Extremities: Status post left AKA, right BKA Skin:    Psychiatry: Judgement and insight appear normal. Mood & affect appropriate.   Data Reviewed: I have personally reviewed following labs and imaging studies  CBC: Recent Labs  Lab 01/13/20 1154 01/14/20 0858  WBC 6.7 6.6  NEUTROABS 4.2  --   HGB 10.0* 9.0*  HCT 32.0* 28.2*  MCV 90.9 89.0  PLT 160 712*   Basic Metabolic Panel: Recent Labs  Lab 01/13/20 1154  01/14/20 0858  NA 139 138  K 4.3 4.9  CL 97* 99  CO2 25 20*  GLUCOSE 103* 112*  BUN 71* 79*  CREATININE 8.46* 8.94*  CALCIUM 9.5 9.1  PHOS  --  7.7*   GFR: Estimated Creatinine Clearance: 8.6 mL/min (A) (by C-G formula based on SCr of 8.94 mg/dL (H)). Liver Function Tests: Recent Labs  Lab 01/13/20 1154  AST 34  ALT 34  ALKPHOS 70  BILITOT 1.4*  PROT 7.1  ALBUMIN 3.7   No results for input(s): LIPASE, AMYLASE in the last 168 hours. No results for input(s): AMMONIA in the last 168 hours. Coagulation Profile: No results for input(s): INR, PROTIME in the last 168 hours. Cardiac Enzymes: No results for input(s): CKTOTAL, CKMB, CKMBINDEX, TROPONINI in the last 168 hours. BNP (last 3 results) No results for input(s): PROBNP in the last 8760 hours. HbA1C: No results for input(s): HGBA1C in the last 72 hours. CBG: No results for input(s): GLUCAP in the last 168 hours. Lipid Profile: No results for input(s): CHOL, HDL, LDLCALC, TRIG, CHOLHDL, LDLDIRECT in the last 72 hours. Thyroid Function Tests: No results for input(s): TSH, T4TOTAL, FREET4, T3FREE, THYROIDAB in the last 72 hours. Anemia Panel: No results for input(s): VITAMINB12, FOLATE, FERRITIN, TIBC, IRON, RETICCTPCT in the last 72 hours. Sepsis Labs: Recent Labs  Lab 01/13/20 1154 01/13/20 1425   LATICACIDVEN 1.4 2.0*    Recent Results (from the past 240 hour(s))  Blood culture (routine x 2)     Status: None (Preliminary result)   Collection Time: 01/13/20 11:54 AM   Specimen: BLOOD  Result Value Ref Range Status   Specimen Description BLOOD RIGHT ARM  Final   Special Requests   Final    BOTTLES DRAWN AEROBIC AND ANAEROBIC Blood Culture results may not be optimal due to an excessive volume of blood received in culture bottles   Culture   Final    NO GROWTH < 24 HOURS Performed at [facility], [address], [place], [place] [address]    Report Status PENDING  Incomplete  Blood culture (routine x 2)     Status: None (Preliminary result)   Collection Time: 01/13/20 11:54 AM   Specimen: BLOOD  Result Value Ref Range Status   Specimen Description BLOOD RIGHT ANTECUBITAL  Final   Special Requests   Final    BOTTLES DRAWN AEROBIC AND ANAEROBIC Blood Culture adequate volume   Culture   Final    NO GROWTH < 24 HOURS Performed at [facility], [address], [place], [place] [address]    Report Status PENDING  Incomplete  Respiratory Panel by RT PCR (Flu A&B, Covid) - Nasopharyngeal Swab     Status: None   Collection Time: 01/13/20  2:39 PM   Specimen: Nasopharyngeal Swab  Result Value Ref Range Status   SARS Coronavirus 2 by RT PCR NEGATIVE NEGATIVE Final    Comment: (NOTE) SARS-CoV-2 target nucleic acids are NOT DETECTED.  The SARS-CoV-2 RNA is generally detectable in upper respiratoy specimens during the acute phase of infection. The lowest concentration of SARS-CoV-2 viral copies this assay can detect is 131 copies/mL. A negative result does not preclude SARS-Cov-2 infection and should not be used as the sole basis for treatment or other patient management decisions. A negative result may occur with  improper specimen collection/handling, submission of specimen other than nasopharyngeal swab, presence of viral mutation(s) within the areas  targeted by this assay, and inadequate number of viral copies (<131  copies/mL). A negative result must be combined with clinical observations, patient history, and epidemiological information. The expected result is Negative.  Fact Sheet for Patients:  PinkCheek.be  Fact Sheet for Healthcare Providers:  GravelBags.it  This test is no t yet approved or cleared by the [place] FDA and  has been authorized for detection and/or diagnosis of SARS-CoV-2 by FDA under an Emergency Use Authorization (EUA). This EUA will remain  in effect (meaning this test can be used) for the duration of the COVID-19 declaration under Section 564(b)(1) of the Act, 21 U.S.C. section 360bbb-3(b)(1), unless the authorization is terminated or revoked sooner.     Influenza A by PCR NEGATIVE NEGATIVE Final   Influenza B by PCR NEGATIVE NEGATIVE Final    Comment: (NOTE) The Xpert Xpress SARS-CoV-2/FLU/RSV assay is intended as an aid in  the diagnosis of influenza from Nasopharyngeal swab specimens and  should not be used as a sole basis for treatment. Nasal washings and  aspirates are unacceptable for Xpert Xpress SARS-CoV-2/FLU/RSV  testing.  Fact Sheet for Patients: PinkCheek.be  Fact Sheet for Healthcare Providers: GravelBags.it  This test is not yet approved or cleared by the [place] FDA and  has been authorized for detection and/or diagnosis of SARS-CoV-2 by  FDA under an Emergency Use Authorization (EUA). This EUA will remain  in effect (meaning this test can be used) for the duration of the  Covid-19 declaration under Section 564(b)(1) of the Act, 21  U.S.C. section 360bbb-3(b)(1), unless the authorization is  terminated or revoked. Performed at [facility], [address], [place], [place] [address]       Radiology Studies: DG Sacrum/Coccyx  Result Date:  01/13/2020 CLINICAL DATA:  Sacral wounds. EXAM: SACRUM AND COCCYX - 2+ VIEW COMPARISON:  None. FINDINGS: Bones are diffusely demineralized which limits assessment for osteomyelitis. Patient is status post bilateral hip replacement. No gross sacral destruction evident on this limited study. Symphysis pubis unremarkable. IMPRESSION: 1. Limited study due to bony demineralization. No gross sacral destruction evident on this limited study. 2. Status post bilateral hip replacement. Electronically Signed   By: Misty Stanley M.D.   On: 01/13/2020 13:45   DG Femur Min 2 Views Left  Result Date: 01/13/2020 CLINICAL DATA:  Pain EXAM: LEFT FEMUR 2 VIEWS COMPARISON:  None. FINDINGS: Frontal and lateral views obtained. Patient is status post amputation at the distal femur level. Stump region appears unremarkable without air or distal cortical irregularity. Status post total hip replacement on the left prosthetic components well-seated. No acute fracture or dislocation. Extensive arterial vascular calcification noted. Bones are osteoporotic. IMPRESSION: Status post amputation distally with normal appearing stump region. No fracture or dislocation. Total hip replacement with prosthetic components well-seated. Extensive arterial vascular calcification noted. Osteoporosis. Electronically Signed   By: Lowella Grip III M.D.   On: 01/13/2020 13:43      Scheduled Meds: . apixaban  5 mg Oral BID  . aspirin EC  81 mg Oral Daily  . atorvastatin  40 mg Oral q1800  . Chlorhexidine Gluconate Cloth  6 each Topical Q0600  . collagenase   Topical Daily  . epoetin (EPOGEN/PROCRIT) injection  4,000 Units Intravenous Q T,Th,Sa-HD  . finasteride  5 mg Oral Daily  . metoprolol succinate  50 mg Oral Daily  . midodrine  5 mg Oral Q0600  . pregabalin  75 mg Oral QHS  . rOPINIRole  1 mg Oral QHS  . sevelamer carbonate  800 mg Oral TID WC  Continuous Infusions: . ceFEPIme (MAXIPIME) 1 GM IVPB (Mini-Bag Plus) 1 g (01/14/20  0920)  . vancomycin       LOS: 0 days      Time spent: 40 minutes   Dessa Phi, DO [facility] 01/14/2020, 9:35 AM   Available via Epic secure chat 7am-7pm After these hours, please refer to coverage provider listed on [email or web address]

## 2020-01-14 NOTE — Progress Notes (Signed)
Hemodialysis patient known at [facility] trans[facility]. Please contact me with any dialysis placement concerns.  [name] Dialysis Coordinator [phone]

## 2020-01-14 NOTE — TOC Progression Note (Signed)
Transition of Care [facility]) - Progression Note    Patient Details  Name: Antonio Phillips MRN: [id number] Date of Birth: 1945-05-29  [facility] of [facility]) CM/SW Contact  Shelbie Ammons, RN Phone Number: 01/14/2020, 9:30 AM  Clinical Narrative:   Patient refusing transfer to [facility], reports he wants to stay in [place] call to Ezequiel Kayser at the [facility] and she is aware reports that ED nurse sent down the refusal yesterday from the emergency room.          Expected Discharge Plan and Services                                                 Social Determinants of Health (SDOH) Interventions    Readmission Risk Interventions Readmission Risk Prevention Plan 09/10/2019 11/14/2018 11/09/2018  Transportation Screening Complete Complete Complete  PCP or Specialist Appt within 3-5 Days Patient refused Patient refused -  [place] or [place] Patient refused - -  Social Work Consult for [place] Planning/Counseling [place] Screening Not Applicable Not Applicable -  Medication Review (RN Care Manager) Complete Complete Complete  Some recent data might be hidden

## 2020-01-14 NOTE — Progress Notes (Signed)
OT Cancellation Note  Patient Details Name: Antonio Phillips MRN: [id number] DOB: April 10, 1945   Cancelled Treatment:    Reason Eval/Treat Not Completed: Patient at procedure or test/ unavailable. OT order received and chart reviewed. Pt currently off the unit at dialysis. OT will follow up when pt is available to re-attempt OT evaluation.   Darleen Crocker, MS, OTR/L , CBIS ascom [phone]  01/14/20, 3:07 PM   01/14/2020, 3:07 PM

## 2020-01-14 NOTE — Progress Notes (Signed)
   01/14/20 1601  Assess: MEWS Score  Temp 99.8 F (37.7 C)  BP 97/65  Pulse Rate (!) 106  Resp 18  Level of Consciousness Alert  SpO2 97 %  O2 Device Room Air  Assess: MEWS Score  MEWS Temp 0  MEWS Systolic 1  MEWS Pulse 1  MEWS RR 0  MEWS LOC 0  MEWS Score 2  MEWS Score Color Yellow  Assess: if the MEWS score is Yellow or Red  Were vital signs taken at a resting state? Yes  Focused Assessment Change from prior assessment (see assessment flowsheet)  Early Detection of Sepsis Score *See Row Information* Low  MEWS guidelines implemented *See Row Information* Yes  Treat  MEWS Interventions Escalated (See documentation below)  Pain Scale 0-10  Pain Score 0  Take Vital Signs  Increase Vital Sign Frequency  Yellow: Q 2hr X 2 then Q 4hr X 2, if remains yellow, continue Q 4hrs  Escalate  MEWS: Escalate Yellow: discuss with charge nurse/RN and consider discussing with provider and RRT  Notify: Charge Nurse/RN  Name of Charge Nurse/RN Notified [place]  Date Charge Nurse/RN Notified 01/14/20  Time Charge Nurse/RN Notified 0037  Notify: Provider  Provider Name/Title Dessa Phi  Date Provider Notified 01/14/20  Notification Type  (Secure Chat)  Notification Reason Other (Comment) (MEWS)  Response No new orders

## 2020-01-14 NOTE — Progress Notes (Signed)
[facility], [place] 01/14/20  Subjective:   LOS: 0  Patient found resting in bed, in no acute distress or pain. He denies SOB,nausea or vomiting.We will plan for dialysis today.  Objective:  Vital signs in last 24 hours:  Temp:  [97.5 F (36.4 C)-99 F (37.2 C)] 99 F (37.2 C) (11/09 1209) Pulse Rate:  [77-109] 105 (11/09 1209) Resp:  [10-24] 24 (11/09 1209) BP: (93-110)/(47-81) 105/74 (11/09 1209) SpO2:  [89 %-100 %] 100 % (11/09 1209)  Weight change:  Filed Weights   01/13/20 1206  Weight: 99.8 kg    Intake/Output:    Intake/Output Summary (Last 24 hours) at 01/14/2020 1226 Last data filed at 01/14/2020 0938 Gross per 24 hour  Intake 1100 ml  Output 425 ml  Net 675 ml     Physical Exam: General:  Resting in bed, In no acute distress  HEENT  Normocephalic,atraumatic  Pulm/lungs  Lungs clear, normal effort  CVS/Heart  Irregular,S1S2, no rubs or gallops  Abdomen:   Soft, non tender,non distended  Extremities:  Rt.BKA, Lt AKA, no peripheral edema noted  Neurologic:  Speech clear, answers questions appropriately  Skin:  Warm, dry, Lt AKA site with dressing clean,dry and intact  Access:  Left arm AV fistula +bruit,+thrill       Basic Metabolic Panel:  Recent Labs  Lab 01/13/20 1154 01/14/20 0858  NA 139 138  K 4.3 4.9  CL 97* 99  CO2 25 20*  GLUCOSE 103* 112*  BUN 71* 79*  CREATININE 8.46* 8.94*  CALCIUM 9.5 9.1  PHOS  --  7.7*     CBC: Recent Labs  Lab 01/13/20 1154 01/14/20 0858  WBC 6.7 6.6  NEUTROABS 4.2  --   HGB 10.0* 9.0*  HCT 32.0* 28.2*  MCV 90.9 89.0  PLT 160 135*      Lab Results  Component Value Date   HEPBSAG Negative 11/09/2018   HEPBIGM Negative 11/12/2018      Microbiology:  Recent Results (from the past 240 hour(s))  Blood culture (routine x 2)     Status: None (Preliminary result)   Collection Time: 01/13/20 11:54 AM   Specimen: BLOOD  Result Value Ref Range Status   Specimen  Description BLOOD RIGHT ARM  Final   Special Requests   Final    BOTTLES DRAWN AEROBIC AND ANAEROBIC Blood Culture results may not be optimal due to an excessive volume of blood received in culture bottles   Culture   Final    NO GROWTH < 24 HOURS Performed at [facility], [address] [place], [place] [address]    Report Status PENDING  Incomplete  Blood culture (routine x 2)     Status: None (Preliminary result)   Collection Time: 01/13/20 11:54 AM   Specimen: BLOOD  Result Value Ref Range Status   Specimen Description BLOOD RIGHT ANTECUBITAL  Final   Special Requests   Final    BOTTLES DRAWN AEROBIC AND ANAEROBIC Blood Culture adequate volume   Culture   Final    NO GROWTH < 24 HOURS Performed at [facility], [address] [place], [place] [address]    Report Status PENDING  Incomplete  Respiratory Panel by RT PCR (Flu A&B, Covid) - Nasopharyngeal Swab     Status: None   Collection Time: 01/13/20  2:39 PM   Specimen: Nasopharyngeal Swab  Result Value Ref Range Status   SARS Coronavirus 2 by RT PCR NEGATIVE NEGATIVE Final  Comment: (NOTE) SARS-CoV-2 target nucleic acids are NOT DETECTED.  The SARS-CoV-2 RNA is generally detectable in upper respiratoy specimens during the acute phase of infection. The lowest concentration of SARS-CoV-2 viral copies this assay can detect is 131 copies/mL. A negative result does not preclude SARS-Cov-2 infection and should not be used as the sole basis for treatment or other patient management decisions. A negative result may occur with  improper specimen collection/handling, submission of specimen other than nasopharyngeal swab, presence of viral mutation(s) within the areas targeted by this assay, and inadequate number of viral copies (<131 copies/mL). A negative result must be combined with clinical observations, patient history, and epidemiological information. The expected result is Negative.  Fact Sheet for  Patients:  PinkCheek.be  Fact Sheet for Healthcare Providers:  GravelBags.it  This test is no t yet approved or cleared by the [place] FDA and  has been authorized for detection and/or diagnosis of SARS-CoV-2 by FDA under an Emergency Use Authorization (EUA). This EUA will remain  in effect (meaning this test can be used) for the duration of the COVID-19 declaration under Section 564(b)(1) of the Act, 21 U.S.C. section 360bbb-3(b)(1), unless the authorization is terminated or revoked sooner.     Influenza A by PCR NEGATIVE NEGATIVE Final   Influenza B by PCR NEGATIVE NEGATIVE Final    Comment: (NOTE) The Xpert Xpress SARS-CoV-2/FLU/RSV assay is intended as an aid in  the diagnosis of influenza from Nasopharyngeal swab specimens and  should not be used as a sole basis for treatment. Nasal washings and  aspirates are unacceptable for Xpert Xpress SARS-CoV-2/FLU/RSV  testing.  Fact Sheet for Patients: PinkCheek.be  Fact Sheet for Healthcare Providers: GravelBags.it  This test is not yet approved or cleared by the [place] FDA and  has been authorized for detection and/or diagnosis of SARS-CoV-2 by  FDA under an Emergency Use Authorization (EUA). This EUA will remain  in effect (meaning this test can be used) for the duration of the  Covid-19 declaration under Section 564(b)(1) of the Act, 21  U.S.C. section 360bbb-3(b)(1), unless the authorization is  terminated or revoked. Performed at [facility], [address] [place], [place] [address]     Coagulation Studies: No results for input(s): LABPROT, INR in the last 72 hours.  Urinalysis: No results for input(s): COLORURINE, LABSPEC, PHURINE, GLUCOSEU, HGBUR, BILIRUBINUR, KETONESUR, PROTEINUR, UROBILINOGEN, NITRITE, LEUKOCYTESUR in the last 72 hours.  Invalid input(s): APPERANCEUR     Imaging: DG Sacrum/Coccyx  Result Date: 01/13/2020 CLINICAL DATA:  Sacral wounds. EXAM: SACRUM AND COCCYX - 2+ VIEW COMPARISON:  None. FINDINGS: Bones are diffusely demineralized which limits assessment for osteomyelitis. Patient is status post bilateral hip replacement. No gross sacral destruction evident on this limited study. Symphysis pubis unremarkable. IMPRESSION: 1. Limited study due to bony demineralization. No gross sacral destruction evident on this limited study. 2. Status post bilateral hip replacement. Electronically Signed   By: Misty Stanley M.D.   On: 01/13/2020 13:45   DG Femur Min 2 Views Left  Result Date: 01/13/2020 CLINICAL DATA:  Pain EXAM: LEFT FEMUR 2 VIEWS COMPARISON:  None. FINDINGS: Frontal and lateral views obtained. Patient is status post amputation at the distal femur level. Stump region appears unremarkable without air or distal cortical irregularity. Status post total hip replacement on the left prosthetic components well-seated. No acute fracture or dislocation. Extensive arterial vascular calcification noted. Bones are osteoporotic. IMPRESSION: Status post amputation distally with normal appearing stump region. No fracture or dislocation. Total  hip replacement with prosthetic components well-seated. Extensive arterial vascular calcification noted. Osteoporosis. Electronically Signed   By: Lowella Grip III M.D.   On: 01/13/2020 13:43     Medications:    . apixaban  5 mg Oral BID  . atorvastatin  40 mg Oral q1800  . Chlorhexidine Gluconate Cloth  6 each Topical Q0600  . collagenase   Topical Daily  . epoetin (EPOGEN/PROCRIT) injection  4,000 Units Intravenous Q T,Th,Sa-HD  . finasteride  5 mg Oral Daily  . metoprolol succinate  50 mg Oral Daily  . midodrine  5 mg Oral Q0600  . pregabalin  75 mg Oral QHS  . rOPINIRole  1 mg Oral QHS  . sevelamer carbonate  800 mg Oral TID WC     Assessment/ Plan:  74 y.o. male with end-stage renal disease left  stump and bilateral buttock wounds, h/o hip arthroplasty, A Fib, DM-2 was admitted on 01/13/2020 for sacral wounds and cellulitis   Active Problems:   ESRD on dialysis ([place])   Sacral wound   Pressure injury of skin   Weakness  Sacral wound [S31.000A] Weakness [R53.1] [facility]/N [facility]/ left arm AVF/ TTS/ 92.5 kg  #. ESRD Dialysis today Orders placed Will continue TTS schedule  #. Anemia of CKD  Lab Results  Component Value Date   HGB 9.0 (L) 01/14/2020   Epogen 4000 units with dialysis TTS  #. Secondary hyperparathyroidism of renal origin N 25.81   No results found for: PTH Lab Results  Component Value Date   PHOS 7.7 (H) 01/14/2020   Calcium 9.1 Continue Sevelamer   #.  Sacral wound and wound to the left lower extremity stump Left AKA site with dressing clean,dry and intact Stays afebrile, pain controlled with PRN medications Wound care management per primary team    LOS: 0 Antonio Phillips 11/9/202112:26 PM  [facility] [place], [place]

## 2020-01-15 DIAGNOSIS — L89153 Pressure ulcer of sacral region, stage 3: Secondary | ICD-10-CM

## 2020-01-15 DIAGNOSIS — D696 Thrombocytopenia, unspecified: Secondary | ICD-10-CM

## 2020-01-15 DIAGNOSIS — Z992 Dependence on renal dialysis: Secondary | ICD-10-CM

## 2020-01-15 DIAGNOSIS — D6869 Other thrombophilia: Secondary | ICD-10-CM

## 2020-01-15 DIAGNOSIS — I482 Chronic atrial fibrillation, unspecified: Secondary | ICD-10-CM

## 2020-01-15 DIAGNOSIS — N186 End stage renal disease: Secondary | ICD-10-CM

## 2020-01-15 DIAGNOSIS — R531 Weakness: Secondary | ICD-10-CM

## 2020-01-15 DIAGNOSIS — D638 Anemia in other chronic diseases classified elsewhere: Secondary | ICD-10-CM

## 2020-01-15 DIAGNOSIS — L97922 Non-pressure chronic ulcer of unspecified part of left lower leg with fat layer exposed: Secondary | ICD-10-CM

## 2020-01-15 LAB — HEPATITIS B DNA, ULTRAQUANTITATIVE, PCR
HBV DNA SERPL PCR-ACNC: NOT DETECTED IU/mL
HBV DNA SERPL PCR-LOG IU: UNDETERMINED log10 IU/mL

## 2020-01-15 LAB — BASIC METABOLIC PANEL
Anion gap: 16 — ABNORMAL HIGH (ref 5–15)
BUN: 90 mg/dL — ABNORMAL HIGH (ref 8–23)
CO2: 21 mmol/L — ABNORMAL LOW (ref 22–32)
Calcium: 9 mg/dL (ref 8.9–10.3)
Chloride: 101 mmol/L (ref 98–111)
Creatinine, Ser: 9.48 mg/dL — ABNORMAL HIGH (ref 0.61–1.24)
GFR, Estimated: 5 mL/min — ABNORMAL LOW (ref >60)
Glucose, Bld: 118 mg/dL — ABNORMAL HIGH (ref 70–99)
Potassium: 4.9 mmol/L (ref 3.5–5.1)
Sodium: 138 mmol/L (ref 135–145)

## 2020-01-15 LAB — CBC
HCT: 27.5 % — ABNORMAL LOW (ref 39.0–52.0)
Hemoglobin: 8.8 g/dL — ABNORMAL LOW (ref 13.0–17.0)
MCH: 28.6 pg (ref 26.0–34.0)
MCHC: 32 g/dL (ref 30.0–36.0)
MCV: 89.3 fL (ref 80.0–100.0)
Platelets: 120 10*3/uL — ABNORMAL LOW (ref 150–400)
RBC: 3.08 MIL/uL — ABNORMAL LOW (ref 4.22–5.81)
RDW: 17.7 % — ABNORMAL HIGH (ref 11.5–15.5)
WBC: 7.1 10*3/uL (ref 4.0–10.5)
nRBC: 0 % (ref 0.0–0.2)

## 2020-01-15 LAB — HEPATITIS B SURFACE ANTIBODY, QUANTITATIVE: Hep B S AB Quant (Post): <3.1 m[IU]/mL — ABNORMAL LOW (ref >9.9)

## 2020-01-15 MED ORDER — CARVEDILOL 3.125 MG PO TABS
6.2500 mg | ORAL_TABLET | Freq: Two times a day (BID) | ORAL | Status: DC
  Administered 2020-01-15: 6.25 mg via ORAL
  Filled 2020-01-15: qty 2

## 2020-01-15 MED ORDER — CARVEDILOL 3.125 MG PO TABS: 3.1250 mg | ORAL_TABLET | Freq: Two times a day (BID) | ORAL | Status: DC

## 2020-01-15 MED ORDER — ACETAMINOPHEN 325 MG PO TABS: 650.0000 mg | ORAL_TABLET | Freq: Four times a day (QID) | ORAL | Status: DC | PRN

## 2020-01-15 MED ORDER — HYDROCODONE-ACETAMINOPHEN 5-325 MG PO TABS
1.0000 | ORAL_TABLET | Freq: Four times a day (QID) | ORAL | Status: DC | PRN
  Administered 2020-01-15 – 2020-01-17 (×5): 1 via ORAL
  Filled 2020-01-15 (×5): qty 1

## 2020-01-15 NOTE — Plan of Care (Signed)

## 2020-01-15 NOTE — Progress Notes (Signed)
OT Cancellation Note  Patient Details Name: Antonio Phillips MRN: [id number] DOB: Mar 11, 1945   Cancelled Treatment:    Reason Eval/Treat Not Completed: Patient at procedure or test/ unavailable  Pt off floor to HD. Will f/u for OT evaluation at later date/time.   Gerrianne Scale, [place], OTR/L ascom [phone] 01/15/20, 4:06 PM

## 2020-01-15 NOTE — Evaluation (Signed)
Physical Therapy Evaluation Patient Details Name: Antonio Phillips MRN: [id number] DOB: [birth year] Today's Date: 01/15/2020   History of Present Illness  Antonio Phillips is a 74 y.o. male with medical history significant for end-stage renal disease on hemodialysis, hypertension, hyperlipidemia, bilateral AKA, atrial fibrillation on apixaban, neuropathy, presented to the emergency department via EMS for chief concerns of weakness and missed dialysis session with bilateral stump wounds.  Clinical Impression  Patient received in bed, reports he will try to sit up on side of bed. Reports significant pain in bottom and L amputation site. He does not like to be helped at all, but requires assistance for bed mobility and transfers. He is able to sit up on side of bed with mod assist. Min assist to roll using bed rails and mod assist to slide up in bed. He will continue to benefit from skilled PT while here to improve functional independence.    Follow Up Recommendations SNF    Equipment Recommendations  None recommended by PT    Recommendations for Other Services       Precautions / Restrictions Precautions Precautions: Fall Restrictions Weight Bearing Restrictions: No Other Position/Activity Restrictions: Patient has B leg amputations      Mobility  Bed Mobility Overal bed mobility: Needs Assistance Bed Mobility: Rolling;Supine to Sit;Sit to Supine Rolling: Min assist   Supine to sit: Mod assist Sit to supine: Mod assist   General bed mobility comments: patient likes to do things himself and his way, but requires assistance.    Transfers                 General transfer comment: not attempted  Ambulation/Gait             General Gait Details: non ambulatory at baseline  Stairs            Wheelchair Mobility    Modified Rankin (Stroke Patients Only)       Balance Overall balance assessment: Needs assistance Sitting-balance support: Bilateral  upper extremity supported Sitting balance-Leahy Scale: Poor Sitting balance - Comments: requires UE assist and supervision                                     Pertinent Vitals/Pain Pain Assessment: Faces Faces Pain Scale: Hurts whole lot Pain Location: bottom, L LE amputation site Pain Descriptors / Indicators: Aching;Grimacing;Guarding;Discomfort Pain Intervention(s): Limited activity within patient's tolerance;Monitored during session;Repositioned    Home Living Family/patient expects to be discharged to:: Unsure                 Additional Comments: was in motel after being discharged from [place]    Prior Function Level of Independence: Needs assistance   Gait / Transfers Assistance Needed: patient is wheelchair bound.           Hand Dominance        Extremity/Trunk Assessment   Upper Extremity Assessment Upper Extremity Assessment: Defer to OT evaluation    Lower Extremity Assessment Lower Extremity Assessment: Generalized weakness       Communication   Communication: No difficulties  Cognition Arousal/Alertness: Awake/alert Behavior During Therapy: WFL for tasks assessed/performed Overall Cognitive Status: Within Functional Limits for tasks assessed  General Comments      Exercises     Assessment/Plan    PT Assessment Patient needs continued PT services  PT Problem List Decreased strength;Decreased mobility;Decreased activity tolerance;Decreased balance;Pain       PT Treatment Interventions Therapeutic activities;Therapeutic exercise;Functional mobility training;Patient/family education    PT Goals (Current goals can be found in the Care Plan section)  Acute Rehab PT Goals Patient Stated Goal: none stated PT Goal Formulation: With patient Time For Goal Achievement: 01/29/20 Potential to Achieve Goals: Fair    Frequency Min 2X/week   Barriers to discharge Decreased  caregiver support;Inaccessible home environment      Co-evaluation               AM-PAC PT "6 Clicks" Mobility  Outcome Measure Help needed turning from your back to your side while in a flat bed without using bedrails?: A Lot Help needed moving from lying on your back to sitting on the side of a flat bed without using bedrails?: A Lot Help needed moving to and from a bed to a chair (including a wheelchair)?: Total Help needed standing up from a chair using your arms (e.g., wheelchair or bedside chair)?: Total Help needed to walk in hospital room?: Total Help needed climbing 3-5 steps with a railing? : Total 6 Click Score: 8    End of Session   Activity Tolerance: Patient limited by pain Patient left: in bed;with call bell/phone within reach;with bed alarm set Nurse Communication: Mobility status PT Visit Diagnosis: Other abnormalities of gait and mobility (R26.89);Muscle weakness (generalized) (M62.81);Pain;Difficulty in walking, not elsewhere classified (R26.2);History of falling (Z91.81) Pain - Right/Left: Left Pain - part of body: Leg (and bottom)    Time: 1030-1055 PT Time Calculation (min) (ACUTE ONLY): 25 min   Charges:   PT Evaluation $PT Eval Moderate Complexity: 1 Mod PT Treatments $Therapeutic Activity: 8-22 mins        Kavir Savoca, PT, GCS 01/15/20,11:39 AM

## 2020-01-15 NOTE — NC FL2 (Signed)
[place] LEVEL OF CARE SCREENING TOOL     IDENTIFICATION  Patient Name: Antonio Phillips Birthdate: 09-24-1945 Sex: male Admission Date (Current Location): 01/13/2020  [place] and [place] Number:  Engineering geologist and Address:  [facility], [address], [place], [place] [address]      Provider Number: [id number]  Attending Physician Name and Address:  Loletha Grayer, MD  Relative Name and Phone Number:  April Bobbye [place] (friend) [phone]    Current Level of Care: Hospital Recommended Level of Care: [place] Prior Approval Number:    Date Approved/Denied:   PASRR Number: [id number]  Discharge Plan: SNF    Current Diagnoses: Patient Active Problem List   Diagnosis Date Noted  . Sacral wound 01/13/2020  . Pressure injury of skin 01/13/2020  . Weakness 01/13/2020  . ESRD on dialysis ([place]) 04/25/2018    Orientation RESPIRATION BLADDER Height & Weight     Self, Time, Situation, Place  Normal Continent Weight: 99.8 kg Height:  5\' 11"  (180.3 cm)  BEHAVIORAL SYMPTOMS/MOOD NEUROLOGICAL BOWEL NUTRITION STATUS      Continent Diet (Renal Diet with 1279ml fluid restriction)  AMBULATORY STATUS COMMUNICATION OF NEEDS Skin    (bilateral amputee) Verbally Surgical wounds                       Personal Care Assistance Level of Assistance  Bathing, Feeding, Dressing Bathing Assistance: Limited assistance Feeding assistance: Independent Dressing Assistance: Limited assistance     Functional Limitations Info  Sight, Hearing, Speech Sight Info: Adequate Hearing Info: Adequate Speech Info: Adequate    SPECIAL CARE FACTORS FREQUENCY  PT (By licensed PT), OT (By licensed OT)                    Contractures Contractures Info: Not present    Additional Factors Info  Code Status, Allergies Code Status Info: Full Allergies Info: No known allergies           Current Medications (01/15/2020):   This is the current hospital active medication list Current Facility-Administered Medications  Medication Dose Route Frequency Provider Last Rate Last Admin  . apixaban (ELIQUIS) tablet 5 mg  5 mg Oral BID Cox, Amy N, DO   5 mg at 01/15/20 0900  . atorvastatin (LIPITOR) tablet 40 mg  40 mg Oral q1800 Cox, Amy N, DO   40 mg at 01/14/20 1801  . carvedilol (COREG) tablet 3.125 mg  3.125 mg Oral BID WC Wieting, Richard, MD      . Chlorhexidine Gluconate Cloth 2 % PADS 6 each  6 each Topical Q0600 Cox, Amy N, DO      . collagenase (SANTYL) ointment   Topical Daily Cox, Amy N, DO   Given at 01/15/20 1000  . epoetin alfa (EPOGEN) injection 4,000 Units  4,000 Units Intravenous Q T,Th,Sa-HD Cox, Amy N, DO      . finasteride (PROSCAR) tablet 5 mg  5 mg Oral Daily Cox, Amy N, DO   5 mg at 01/15/20 0900  . lactulose (CHRONULAC) 10 GM/15ML solution 10 g  10 g Oral BID PRN Cox, Amy N, DO      . loperamide (IMODIUM) capsule 4 mg  4 mg Oral QID PRN Cox, Amy N, DO      . midodrine (PROAMATINE) tablet 5 mg  5 mg Oral Q0600 Cox, Amy N, DO   5 mg at 01/15/20 0900  . pregabalin (LYRICA) capsule 75 mg  75 mg Oral QHS Cox, Amy N, DO   75 mg at 01/14/20 2105  . rOPINIRole (REQUIP) tablet 1 mg  1 mg Oral QHS Cox, Amy N, DO   1 mg at 01/14/20 2105  . sevelamer carbonate (RENVELA) tablet 800 mg  800 mg Oral TID WC Cox, Amy N, DO   800 mg at 01/15/20 1218     Discharge Medications: Please see discharge summary for a list of discharge medications.  Relevant Imaging Results:  Relevant Lab Results:   Additional Information [id number]  Shelbie Ammons, RN

## 2020-01-15 NOTE — Progress Notes (Signed)
[facility], [place] 01/15/20  Subjective:   LOS: 1  Patient found resting in bed, in no acute distress or pain.  He denies SOB,nausea or vomiting. Ate breakfast 100% Currently on room air No chest pain or SOB We will plan for dialysis today.  Objective:  Vital signs in last 24 hours:  Temp:  [98 F (36.7 C)-99.8 F (37.7 C)] 98.6 F (37 C) (11/10 0723) Pulse Rate:  [60-132] 84 (11/10 0723) Resp:  [14-24] 17 (11/10 0723) BP: (83-105)/(30-76) 102/76 (11/10 0723) SpO2:  [90 %-100 %] 100 % (11/10 0723)  Weight change:  Filed Weights   01/13/20 1206  Weight: 99.8 kg    Intake/Output:    Intake/Output Summary (Last 24 hours) at 01/15/2020 1009 Last data filed at 01/15/2020 0412 Gross per 24 hour  Intake 72 ml  Output 375 ml  Net -303 ml     Physical Exam: General:  Resting in bed, In no acute distress  HEENT  Normocephalic,atraumatic  Pulm/lungs  Lungs clear, normal effort  CVS/Heart  Irregular,S1S2, no rubs or gallops  Abdomen:   Soft, non tender,non distended  Extremities:  Rt.BKA, Lt AKA, no peripheral edema noted  Neurologic:  Speech clear, answers questions appropriately  Skin:  Warm, dry, Lt AKA site with dressing clean,dry and intact  Access:  Left arm AV fistula +bruit,+thrill       Basic Metabolic Panel:  Recent Labs  Lab 01/13/20 1154 01/14/20 0858 01/15/20 0430  NA 139 138 138  K 4.3 4.9 4.9  CL 97* 99 101  CO2 25 20* 21*  GLUCOSE 103* 112* 118*  BUN 71* 79* 90*  CREATININE 8.46* 8.94* 9.48*  CALCIUM 9.5 9.1 9.0  PHOS  --  7.7*  --      CBC: Recent Labs  Lab 01/13/20 1154 01/14/20 0858 01/15/20 0430  WBC 6.7 6.6 7.1  NEUTROABS 4.2  --   --   HGB 10.0* 9.0* 8.8*  HCT 32.0* 28.2* 27.5*  MCV 90.9 89.0 89.3  PLT 160 135* 120*      Lab Results  Component Value Date   HEPBSAG NON REACTIVE 01/14/2020   HEPBIGM Negative 11/12/2018      Microbiology:  Recent Results (from the past 240 hour(s))   Blood culture (routine x 2)     Status: None (Preliminary result)   Collection Time: 01/13/20 11:54 AM   Specimen: BLOOD  Result Value Ref Range Status   Specimen Description BLOOD RIGHT ARM  Final   Special Requests   Final    BOTTLES DRAWN AEROBIC AND ANAEROBIC Blood Culture results may not be optimal due to an excessive volume of blood received in culture bottles   Culture   Final    NO GROWTH 2 DAYS Performed at [facility], [address] [place], [place] [address]    Report Status PENDING  Incomplete  Blood culture (routine x 2)     Status: None (Preliminary result)   Collection Time: 01/13/20 11:54 AM   Specimen: BLOOD  Result Value Ref Range Status   Specimen Description BLOOD RIGHT ANTECUBITAL  Final   Special Requests   Final    BOTTLES DRAWN AEROBIC AND ANAEROBIC Blood Culture adequate volume   Culture   Final    NO GROWTH 2 DAYS Performed at [facility], [address] [place], [place] [address]    Report Status PENDING  Incomplete  Respiratory Panel by RT PCR (Flu A&B, Covid) - Nasopharyngeal Swab  Status: None   Collection Time: 01/13/20  2:39 PM   Specimen: Nasopharyngeal Swab  Result Value Ref Range Status   SARS Coronavirus 2 by RT PCR NEGATIVE NEGATIVE Final    Comment: (NOTE) SARS-CoV-2 target nucleic acids are NOT DETECTED.  The SARS-CoV-2 RNA is generally detectable in upper respiratoy specimens during the acute phase of infection. The lowest concentration of SARS-CoV-2 viral copies this assay can detect is 131 copies/mL. A negative result does not preclude SARS-Cov-2 infection and should not be used as the sole basis for treatment or other patient management decisions. A negative result may occur with  improper specimen collection/handling, submission of specimen other than nasopharyngeal swab, presence of viral mutation(s) within the areas targeted by this assay, and inadequate number of viral copies (<131 copies/mL). A  negative result must be combined with clinical observations, patient history, and epidemiological information. The expected result is Negative.  Fact Sheet for Patients:  PinkCheek.be  Fact Sheet for Healthcare Providers:  GravelBags.it  This test is no t yet approved or cleared by the [place] FDA and  has been authorized for detection and/or diagnosis of SARS-CoV-2 by FDA under an Emergency Use Authorization (EUA). This EUA will remain  in effect (meaning this test can be used) for the duration of the COVID-19 declaration under Section 564(b)(1) of the Act, 21 U.S.C. section 360bbb-3(b)(1), unless the authorization is terminated or revoked sooner.     Influenza A by PCR NEGATIVE NEGATIVE Final   Influenza B by PCR NEGATIVE NEGATIVE Final    Comment: (NOTE) The Xpert Xpress SARS-CoV-2/FLU/RSV assay is intended as an aid in  the diagnosis of influenza from Nasopharyngeal swab specimens and  should not be used as a sole basis for treatment. Nasal washings and  aspirates are unacceptable for Xpert Xpress SARS-CoV-2/FLU/RSV  testing.  Fact Sheet for Patients: PinkCheek.be  Fact Sheet for Healthcare Providers: GravelBags.it  This test is not yet approved or cleared by the [place] FDA and  has been authorized for detection and/or diagnosis of SARS-CoV-2 by  FDA under an Emergency Use Authorization (EUA). This EUA will remain  in effect (meaning this test can be used) for the duration of the  Covid-19 declaration under Section 564(b)(1) of the Act, 21  U.S.C. section 360bbb-3(b)(1), unless the authorization is  terminated or revoked. Performed at [facility], [address] [place], [place] [address]     Coagulation Studies: No results for input(s): LABPROT, INR in the last 72 hours.  Urinalysis: No results for input(s): COLORURINE,  LABSPEC, PHURINE, GLUCOSEU, HGBUR, BILIRUBINUR, KETONESUR, PROTEINUR, UROBILINOGEN, NITRITE, LEUKOCYTESUR in the last 72 hours.  Invalid input(s): APPERANCEUR    Imaging: DG Sacrum/Coccyx  Result Date: 01/13/2020 CLINICAL DATA:  Sacral wounds. EXAM: SACRUM AND COCCYX - 2+ VIEW COMPARISON:  None. FINDINGS: Bones are diffusely demineralized which limits assessment for osteomyelitis. Patient is status post bilateral hip replacement. No gross sacral destruction evident on this limited study. Symphysis pubis unremarkable. IMPRESSION: 1. Limited study due to bony demineralization. No gross sacral destruction evident on this limited study. 2. Status post bilateral hip replacement. Electronically Signed   By: Misty Stanley M.D.   On: 01/13/2020 13:45   DG Femur Min 2 Views Left  Result Date: 01/13/2020 CLINICAL DATA:  Pain EXAM: LEFT FEMUR 2 VIEWS COMPARISON:  None. FINDINGS: Frontal and lateral views obtained. Patient is status post amputation at the distal femur level. Stump region appears unremarkable without air or distal cortical irregularity. Status post total hip  replacement on the left prosthetic components well-seated. No acute fracture or dislocation. Extensive arterial vascular calcification noted. Bones are osteoporotic. IMPRESSION: Status post amputation distally with normal appearing stump region. No fracture or dislocation. Total hip replacement with prosthetic components well-seated. Extensive arterial vascular calcification noted. Osteoporosis. Electronically Signed   By: Lowella Grip III M.D.   On: 01/13/2020 13:43     Medications:    . apixaban  5 mg Oral BID  . atorvastatin  40 mg Oral q1800  . carvedilol  3.125 mg Oral BID WC  . Chlorhexidine Gluconate Cloth  6 each Topical Q0600  . collagenase   Topical Daily  . epoetin (EPOGEN/PROCRIT) injection  4,000 Units Intravenous Q T,Th,Sa-HD  . finasteride  5 mg Oral Daily  . midodrine  5 mg Oral Q0600  . pregabalin  75 mg Oral  QHS  . rOPINIRole  1 mg Oral QHS  . sevelamer carbonate  800 mg Oral TID WC     Assessment/ Plan:  74 y.o. male with end-stage renal disease left stump and bilateral buttock wounds, h/o hip arthroplasty, A Fib, DM-2 was admitted on 01/13/2020 for sacral wounds and cellulitis   Active Problems:   ESRD on dialysis ([place])   Sacral wound   Pressure injury of skin   Weakness  Sacral wound [S31.000A] Weakness [R53.1] [facility]/N [facility]/ left arm AVF/ TTS/ 92.5 kg  #. ESRD Dialysis today. Still has A Fib and low normal BP but HD can be tried HF 90's - 120's; asymptomatic Orders placed Will continue TTS schedule  #. Anemia of CKD  Lab Results  Component Value Date   HGB 8.8 (L) 01/15/2020   Epogen 4000 units with dialysis TTS  #. Secondary hyperparathyroidism of renal origin N 25.81   No results found for: PTH Lab Results  Component Value Date   PHOS 7.7 (H) 01/14/2020   Calcium 9.1 Continue Sevelamer with meals  #.  Sacral wound and wound to the left lower extremity stump Left AKA site with dressing clean,dry and intact Stays afebrile, pain controlled with PRN medications Wound care management per primary team    LOS: [place] 11/10/202110:09 [place], [place]

## 2020-01-15 NOTE — Progress Notes (Signed)
Patient ID: Antonio Phillips, male   DOB: 12-09-1945, 74 y.o.   MRN: [id number] [facility] PROGRESS NOTE  Antonio Phillips KVQ:[id number] DOB: September 26, 1945 DOA: 01/13/2020 PCP: Marsh Dolly, MD  HPI/Subjective: Patient feels okay.  Ate okay.  Yesterday dialysis canceled because heart rate went fast and in atrial fibrillation.  Patient came in with left lower extremity wound and missing dialysis.  Objective: Vitals:   01/15/20 1134 01/15/20 1517  BP: 111/74 104/65  Pulse: 86 91  Resp: 17 17  Temp: 98.5 F (36.9 C) 98.3 F (36.8 C)  SpO2: 99% 100%    Filed Weights   01/13/20 1206  Weight: 99.8 kg    ROS: Review of Systems  Respiratory: Negative for cough and shortness of breath.   Cardiovascular: Negative for chest pain.  Gastrointestinal: Negative for abdominal pain, nausea and vomiting.   Exam: Physical Exam HENT:     Head: Normocephalic.     Mouth/Throat:     Pharynx: No oropharyngeal exudate.  Eyes:     General: Lids are normal.     Conjunctiva/sclera: Conjunctivae normal.     Pupils: Pupils are equal, round, and reactive to light.  Cardiovascular:     Rate and Rhythm: Normal rate and regular rhythm.     Heart sounds: Normal heart sounds, S1 normal and S2 normal.  Pulmonary:     Breath sounds: No decreased breath sounds, wheezing, rhonchi or rales.  Abdominal:     Palpations: Abdomen is soft.     Tenderness: There is no abdominal tenderness.  Musculoskeletal:     Right upper leg: No swelling.     Left upper leg: No swelling.  Skin:    General: Skin is warm.     Comments: Left stump ulcer.  Looks like good granulation tissue.  I do not see any signs of infection.  Neurological:     Mental Status: He is alert and oriented to person, place, and time.       Data Reviewed: Basic Metabolic Panel: Recent Labs  Lab 01/13/20 1154 01/14/20 0858 01/15/20 0430  NA 139 138 138  K 4.3 4.9 4.9  CL 97* 99 101  CO2 25 20* 21*  GLUCOSE 103* 112* 118*   BUN 71* 79* 90*  CREATININE 8.46* 8.94* 9.48*  CALCIUM 9.5 9.1 9.0  PHOS  --  7.7*  --    Liver Function Tests: Recent Labs  Lab 01/13/20 1154  AST 34  ALT 34  ALKPHOS 70  BILITOT 1.4*  PROT 7.1  ALBUMIN 3.7   CBC: Recent Labs  Lab 01/13/20 1154 01/14/20 0858 01/15/20 0430  WBC 6.7 6.6 7.1  NEUTROABS 4.2  --   --   HGB 10.0* 9.0* 8.8*  HCT 32.0* 28.2* 27.5*  MCV 90.9 89.0 89.3  PLT 160 135* 120*     Recent Results (from the past 240 hour(s))  Blood culture (routine x 2)     Status: None (Preliminary result)   Collection Time: 01/13/20 11:54 AM   Specimen: BLOOD  Result Value Ref Range Status   Specimen Description BLOOD RIGHT ARM  Final   Special Requests   Final    BOTTLES DRAWN AEROBIC AND ANAEROBIC Blood Culture results may not be optimal due to an excessive volume of blood received in culture bottles   Culture   Final    NO GROWTH 2 DAYS Performed at [facility], [address], [place], [place] [address]    Report Status PENDING  Incomplete  Blood culture (routine x 2)     Status: None (Preliminary result)   Collection Time: 01/13/20 11:54 AM   Specimen: BLOOD  Result Value Ref Range Status   Specimen Description BLOOD RIGHT ANTECUBITAL  Final   Special Requests   Final    BOTTLES DRAWN AEROBIC AND ANAEROBIC Blood Culture adequate volume   Culture   Final    NO GROWTH 2 DAYS Performed at [facility], [address], [place], [place] [address]    Report Status PENDING  Incomplete  Respiratory Panel by RT PCR (Flu A&B, Covid) - Nasopharyngeal Swab     Status: None   Collection Time: 01/13/20  2:39 PM   Specimen: Nasopharyngeal Swab  Result Value Ref Range Status   SARS Coronavirus 2 by RT PCR NEGATIVE NEGATIVE Final    Comment: (NOTE) SARS-CoV-2 target nucleic acids are NOT DETECTED.  The SARS-CoV-2 RNA is generally detectable in upper respiratoy specimens during the acute phase of infection. The lowest concentration of  SARS-CoV-2 viral copies this assay can detect is 131 copies/mL. A negative result does not preclude SARS-Cov-2 infection and should not be used as the sole basis for treatment or other patient management decisions. A negative result may occur with  improper specimen collection/handling, submission of specimen other than nasopharyngeal swab, presence of viral mutation(s) within the areas targeted by this assay, and inadequate number of viral copies (<131 copies/mL). A negative result must be combined with clinical observations, patient history, and epidemiological information. The expected result is Negative.  Fact Sheet for Patients:  PinkCheek.be  Fact Sheet for Healthcare Providers:  GravelBags.it  This test is no t yet approved or cleared by the [place] FDA and  has been authorized for detection and/or diagnosis of SARS-CoV-2 by FDA under an Emergency Use Authorization (EUA). This EUA will remain  in effect (meaning this test can be used) for the duration of the COVID-19 declaration under Section 564(b)(1) of the Act, 21 U.S.C. section 360bbb-3(b)(1), unless the authorization is terminated or revoked sooner.     Influenza A by PCR NEGATIVE NEGATIVE Final   Influenza B by PCR NEGATIVE NEGATIVE Final    Comment: (NOTE) The Xpert Xpress SARS-CoV-2/FLU/RSV assay is intended as an aid in  the diagnosis of influenza from Nasopharyngeal swab specimens and  should not be used as a sole basis for treatment. Nasal washings and  aspirates are unacceptable for Xpert Xpress SARS-CoV-2/FLU/RSV  testing.  Fact Sheet for Patients: PinkCheek.be  Fact Sheet for Healthcare Providers: GravelBags.it  This test is not yet approved or cleared by the [place] FDA and  has been authorized for detection and/or diagnosis of SARS-CoV-2 by  FDA under an Emergency Use  Authorization (EUA). This EUA will remain  in effect (meaning this test can be used) for the duration of the  Covid-19 declaration under Section 564(b)(1) of the Act, 21  U.S.C. section 360bbb-3(b)(1), unless the authorization is  terminated or revoked. Performed at [facility], [address] [place], [place] [address]      Scheduled Meds:  apixaban  5 mg Oral BID   atorvastatin  40 mg Oral q1800   carvedilol  3.125 mg Oral BID WC   Chlorhexidine Gluconate Cloth  6 each Topical Q0600   collagenase   Topical Daily   epoetin (EPOGEN/PROCRIT) injection  4,000 Units Intravenous Q T,Th,Sa-HD   finasteride  5 mg Oral Daily   midodrine  5 mg Oral Q0600   pregabalin  75 mg  Oral QHS   rOPINIRole  1 mg Oral QHS   sevelamer carbonate  800 mg Oral TID WC    Assessment/Plan:  1. Chronic atrial fibrillation with rapid ventricular response yesterday with dialysis.  Since Toprol is dialyzed out will switch over to Coreg.  We will give Coreg 6.25 mg twice a day.  Continue Eliquis for anticoagulation 2. Acquired thrombophilia.  Continue Eliquis for stroke prevention.  Higher risk of stroke with history of atrial fibrillation. 3. Generalized weakness.  Physical therapy recommends rehab. 4. End-stage renal disease.  Trial of hemodialysis today to see how his heart rate goes with dialysis. 5. Left AKA stump wound.  Continue local wound care. 6. Decubitus ulcers, present on admission.  See descriptions below.  Wound care consultation. 7. Anemia of chronic disease.  Continue to monitor hemoglobin. 8. Thrombocytopenia.  Pressure Injury 01/13/20 Sacrum Mid;Upper Deep Tissue Pressure Injury - Purple or maroon localized area of discolored intact skin or blood-filled blister due to damage of underlying soft tissue from pressure and/or shear. (Active)  01/13/20   Location: Sacrum  Location Orientation: Mid;Upper  Staging: Deep Tissue Pressure Injury - Purple or maroon localized area  of discolored intact skin or blood-filled blister due to damage of underlying soft tissue from pressure and/or shear.  Wound Description (Comments):   Present on Admission: Yes     Pressure Injury 01/13/20 Buttocks Left Stage 3 -  Full thickness tissue loss. Subcutaneous fat may be visible but bone, tendon or muscle are NOT exposed. (Active)  01/13/20   Location: Buttocks  Location Orientation: Left  Staging: Stage 3 -  Full thickness tissue loss. Subcutaneous fat may be visible but bone, tendon or muscle are NOT exposed.  Wound Description (Comments):   Present on Admission: Yes     Pressure Injury 01/13/20 Buttocks Right Unstageable - Full thickness tissue loss in which the base of the injury is covered by slough (yellow, tan, gray, green or brown) and/or eschar (tan, brown or black) in the wound bed. (Active)  01/13/20   Location: Buttocks  Location Orientation: Right  Staging: Unstageable - Full thickness tissue loss in which the base of the injury is covered by slough (yellow, tan, gray, green or brown) and/or eschar (tan, brown or black) in the wound bed.  Wound Description (Comments):   Present on Admission: Yes       Code Status:     Code Status Orders  (From admission, onward)         Start     Ordered   01/13/20 2322  Full code  Continuous        01/13/20 2324        Code Status History    Date Active Date Inactive Code Status Order ID Comments User Context   09/09/2019 1722 09/11/2019 0717 Full Code [id number]  Karie Kirks, DO ED   11/09/2018 0916 11/14/2018 2042 Full Code [id number]  Harrie Foreman, MD Inpatient   04/25/2018 0737 04/26/2018 0056 Full Code [id number]  Lance Coon, MD ED   Advance Care Planning Activity     Disposition Plan: Status is: Inpatient  Dispo: The patient is from: [facility]              Anticipated d/c is to: Rehab              Anticipated d/c date is: 01/16/2020 if heart rate okay with dialysis today and if a bed available  Patient currently monitoring patient closely with heart rate on dialysis.  Consultants:  Nephrology  Time spent: 28 minutes  [place]

## 2020-01-15 NOTE — Progress Notes (Signed)
Pt off unit to dialysis at this time.

## 2020-01-16 DIAGNOSIS — I482 Chronic atrial fibrillation, unspecified: Secondary | ICD-10-CM | POA: Diagnosis not present

## 2020-01-16 DIAGNOSIS — B369 Superficial mycosis, unspecified: Secondary | ICD-10-CM

## 2020-01-16 DIAGNOSIS — L89153 Pressure ulcer of sacral region, stage 3: Secondary | ICD-10-CM | POA: Diagnosis not present

## 2020-01-16 DIAGNOSIS — D6869 Other thrombophilia: Secondary | ICD-10-CM | POA: Diagnosis not present

## 2020-01-16 DIAGNOSIS — R531 Weakness: Secondary | ICD-10-CM | POA: Diagnosis not present

## 2020-01-16 MED ORDER — FLUCONAZOLE 100 MG PO TABS
100.0000 mg | ORAL_TABLET | Freq: Every day | ORAL | Status: DC
  Administered 2020-01-16 – 2020-01-17 (×2): 100 mg via ORAL
  Filled 2020-01-16 (×2): qty 1

## 2020-01-16 MED ORDER — CLOTRIMAZOLE 1 % EX CREA
TOPICAL_CREAM | Freq: Two times a day (BID) | CUTANEOUS | Status: DC
  Filled 2020-01-16: qty 15

## 2020-01-16 MED ORDER — CARVEDILOL 3.125 MG PO TABS
3.1250 mg | ORAL_TABLET | Freq: Two times a day (BID) | ORAL | Status: DC
  Administered 2020-01-16 – 2020-01-17 (×3): 3.125 mg via ORAL
  Filled 2020-01-16 (×3): qty 1

## 2020-01-16 NOTE — Progress Notes (Signed)
Patient ID: Antonio Phillips, male   DOB: 01/13/1946, 74 y.o.   MRN: [id number] [facility] PROGRESS NOTE  Antonio Phillips KYH:[id number] DOB: 01-Apr-1945 DOA: 01/13/2020 PCP: Marsh Dolly, MD  HPI/Subjective: Patient complaining of some redness and pain underneath the foreskin on his penis.  Has some pain in his left lower extremity  Objective: Vitals:   01/16/20 0900 01/16/20 1213  BP: 90/70 114/74  Pulse:  85  Resp:  17  Temp:  98.1 F (36.7 C)  SpO2:  95%    Intake/Output Summary (Last 24 hours) at 01/16/2020 1502 Last data filed at 01/15/2020 2317 Gross per 24 hour  Intake 250 ml  Output 250 ml  Net 0 ml   Filed Weights   01/13/20 1206  Weight: 99.8 kg    ROS: Review of Systems  Respiratory: Negative for shortness of breath.   Cardiovascular: Negative for chest pain.  Gastrointestinal: Negative for abdominal pain.  Musculoskeletal: Positive for joint pain.   Exam: Physical Exam HENT:     Head: Normocephalic.     Mouth/Throat:     Pharynx: No oropharyngeal exudate.  Eyes:     General: Lids are normal.     Conjunctiva/sclera: Conjunctivae normal.     Pupils: Pupils are equal, round, and reactive to light.  Cardiovascular:     Rate and Rhythm: Normal rate. Rhythm irregularly irregular.     Heart sounds: Normal heart sounds, S1 normal and S2 normal.  Pulmonary:     Breath sounds: No decreased breath sounds, wheezing, rhonchi or rales.  Abdominal:     Palpations: Abdomen is soft.     Tenderness: There is no abdominal tenderness.  Musculoskeletal:     Right upper leg: Swelling present.     Left upper leg: Swelling present.  Skin:    General: Skin is warm.     Comments: Redness underneath the foreskin on his penis.  Neurological:     Mental Status: He is alert and oriented to person, place, and time.       Data Reviewed: Basic Metabolic Panel: Recent Labs  Lab 01/13/20 1154 01/14/20 0858 01/15/20 0430  NA 139 138 138  K 4.3 4.9 4.9   CL 97* 99 101  CO2 25 20* 21*  GLUCOSE 103* 112* 118*  BUN 71* 79* 90*  CREATININE 8.46* 8.94* 9.48*  CALCIUM 9.5 9.1 9.0  PHOS  --  7.7*  --    Liver Function Tests: Recent Labs  Lab 01/13/20 1154  AST 34  ALT 34  ALKPHOS 70  BILITOT 1.4*  PROT 7.1  ALBUMIN 3.7   CBC: Recent Labs  Lab 01/13/20 1154 01/14/20 0858 01/15/20 0430  WBC 6.7 6.6 7.1  NEUTROABS 4.2  --   --   HGB 10.0* 9.0* 8.8*  HCT 32.0* 28.2* 27.5*  MCV 90.9 89.0 89.3  PLT 160 135* 120*     Recent Results (from the past 240 hour(s))  Blood culture (routine x 2)     Status: None (Preliminary result)   Collection Time: 01/13/20 11:54 AM   Specimen: BLOOD  Result Value Ref Range Status   Specimen Description BLOOD RIGHT ARM  Final   Special Requests   Final    BOTTLES DRAWN AEROBIC AND ANAEROBIC Blood Culture results may not be optimal due to an excessive volume of blood received in culture bottles   Culture   Final    NO GROWTH 3 DAYS Performed at [facility], [address] [place], [place]  [address]    Report Status PENDING  Incomplete  Blood culture (routine x 2)     Status: None (Preliminary result)   Collection Time: 01/13/20 11:54 AM   Specimen: BLOOD  Result Value Ref Range Status   Specimen Description BLOOD RIGHT ANTECUBITAL  Final   Special Requests   Final    BOTTLES DRAWN AEROBIC AND ANAEROBIC Blood Culture adequate volume   Culture   Final    NO GROWTH 3 DAYS Performed at [facility], [address] [place], [place] [address]    Report Status PENDING  Incomplete  Respiratory Panel by RT PCR (Flu A&B, Covid) - Nasopharyngeal Swab     Status: None   Collection Time: 01/13/20  2:39 PM   Specimen: Nasopharyngeal Swab  Result Value Ref Range Status   SARS Coronavirus 2 by RT PCR NEGATIVE NEGATIVE Final    Comment: (NOTE) SARS-CoV-2 target nucleic acids are NOT DETECTED.  The SARS-CoV-2 RNA is generally detectable in upper respiratoy specimens during the  acute phase of infection. The lowest concentration of SARS-CoV-2 viral copies this assay can detect is 131 copies/mL. A negative result does not preclude SARS-Cov-2 infection and should not be used as the sole basis for treatment or other patient management decisions. A negative result may occur with  improper specimen collection/handling, submission of specimen other than nasopharyngeal swab, presence of viral mutation(s) within the areas targeted by this assay, and inadequate number of viral copies (<131 copies/mL). A negative result must be combined with clinical observations, patient history, and epidemiological information. The expected result is Negative.  Fact Sheet for Patients:  PinkCheek.be  Fact Sheet for Healthcare Providers:  GravelBags.it  This test is no t yet approved or cleared by the [place] FDA and  has been authorized for detection and/or diagnosis of SARS-CoV-2 by FDA under an Emergency Use Authorization (EUA). This EUA will remain  in effect (meaning this test can be used) for the duration of the COVID-19 declaration under Section 564(b)(1) of the Act, 21 U.S.C. section 360bbb-3(b)(1), unless the authorization is terminated or revoked sooner.     Influenza A by PCR NEGATIVE NEGATIVE Final   Influenza B by PCR NEGATIVE NEGATIVE Final    Comment: (NOTE) The Xpert Xpress SARS-CoV-2/FLU/RSV assay is intended as an aid in  the diagnosis of influenza from Nasopharyngeal swab specimens and  should not be used as a sole basis for treatment. Nasal washings and  aspirates are unacceptable for Xpert Xpress SARS-CoV-2/FLU/RSV  testing.  Fact Sheet for Patients: PinkCheek.be  Fact Sheet for Healthcare Providers: GravelBags.it  This test is not yet approved or cleared by the [place] FDA and  has been authorized for detection and/or diagnosis  of SARS-CoV-2 by  FDA under an Emergency Use Authorization (EUA). This EUA will remain  in effect (meaning this test can be used) for the duration of the  Covid-19 declaration under Section 564(b)(1) of the Act, 21  U.S.C. section 360bbb-3(b)(1), unless the authorization is  terminated or revoked. Performed at [facility], [address] [place], [place] [address]       Scheduled Meds: . apixaban  5 mg Oral BID  . atorvastatin  40 mg Oral q1800  . carvedilol  3.125 mg Oral BID WC  . Chlorhexidine Gluconate Cloth  6 each Topical Q0600  . clotrimazole   Topical BID  . collagenase   Topical Daily  . epoetin (EPOGEN/PROCRIT) injection  4,000 Units Intravenous Q T,Th,Sa-HD  . finasteride  5 mg Oral Daily  . fluconazole  100 mg Oral Daily  . midodrine  5 mg Oral Q0600  . pregabalin  75 mg Oral QHS  . rOPINIRole  1 mg Oral QHS  . sevelamer carbonate  800 mg Oral TID WC    Assessment/Plan:  1. Chronic atrial fibrillation.  Low-dose Coreg 3.125 mg twice a day.  Continue Eliquis for anticoagulation.  Watch heart rate with dialysis. 2. Acquired thrombophilia.  Continue Eliquis for stroke prevention.  The patient does have a higher risk of stroke with atrial fibrillation. 3. Generalized weakness.  Physical therapy recommends rehab.  Patient this afternoon has accepted a bed at [facility]. 4. Fungal infection under foreskin of penis.  Will give Diflucan and antifungal cream. 5. End-stage renal disease.  Patient refused dialysis today and will be on the schedule for dialysis for tomorrow.  Hopefully will be able to get out to rehab tomorrow afternoon. 6. Left AKA stump wound.  Continue local wound care. 7. Decubitus ulcers, present on admission.  Please see descriptions below.  Continue local wound care. 8. Anemia of chronic disease. 9. Thrombocytopenia.  Pressure Injury 01/13/20 Sacrum Mid;Upper Deep Tissue Pressure Injury - Purple or maroon localized area of discolored  intact skin or blood-filled blister due to damage of underlying soft tissue from pressure and/or shear. (Active)  01/13/20   Location: Sacrum  Location Orientation: Mid;Upper  Staging: Deep Tissue Pressure Injury - Purple or maroon localized area of discolored intact skin or blood-filled blister due to damage of underlying soft tissue from pressure and/or shear.  Wound Description (Comments):   Present on Admission: Yes     Pressure Injury 01/13/20 Buttocks Left Stage 3 -  Full thickness tissue loss. Subcutaneous fat may be visible but bone, tendon or muscle are NOT exposed. (Active)  01/13/20   Location: Buttocks  Location Orientation: Left  Staging: Stage 3 -  Full thickness tissue loss. Subcutaneous fat may be visible but bone, tendon or muscle are NOT exposed.  Wound Description (Comments):   Present on Admission: Yes     Pressure Injury 01/13/20 Buttocks Right Unstageable - Full thickness tissue loss in which the base of the injury is covered by slough (yellow, tan, gray, green or brown) and/or eschar (tan, brown or black) in the wound bed. (Active)  01/13/20   Location: Buttocks  Location Orientation: Right  Staging: Unstageable - Full thickness tissue loss in which the base of the injury is covered by slough (yellow, tan, gray, green or brown) and/or eschar (tan, brown or black) in the wound bed.  Wound Description (Comments):   Present on Admission: Yes       Code Status:     Code Status Orders  (From admission, onward)         Start     Ordered   01/13/20 2322  Full code  Continuous        01/13/20 2324        Code Status History    Date Active Date Inactive Code Status Order ID Comments User Context   09/09/2019 1722 09/11/2019 0717 Full Code [id number]  Karie Kirks, DO ED   11/09/2018 0916 11/14/2018 2042 Full Code [id number]  Harrie Foreman, MD Inpatient   04/25/2018 0737 04/26/2018 0056 Full Code [id number]  Lance Coon, MD ED   Advance Care Planning Activity      Disposition Plan: Status is: Inpatient  Dispo: The patient is from: Motel  Anticipated d/c is to: Rehab              Anticipated d/c date is: Likely 01/17/2020 in the afternoon, after dialysis.              Patient currently has accepted a bed at [facility] for tomorrow.  Since he refused dialysis today will have dialysis tomorrow before disposition.  Continue to monitor heart rate with dialysis.  Consultants:  Nephrology  Time spent: 27 minutes  [place]

## 2020-01-16 NOTE — Progress Notes (Signed)
[facility], [place] 01/16/20  Subjective:   LOS: 2  Patient resting in bed, received dialysis treatment yesterday, tolerated well. We will plan for another session today.  Objective:  Vital signs in last 24 hours:  Temp:  [97.8 F (36.6 C)-99.1 F (37.3 C)] 99 F (37.2 C) (11/11 0752) Pulse Rate:  [62-98] 98 (11/11 0752) Resp:  [15-18] 18 (11/11 0752) BP: (90-113)/(50-71) 90/70 (11/11 0900) SpO2:  [97 %-100 %] 98 % (11/11 0752)  Weight change:  Filed Weights   01/13/20 1206  Weight: 99.8 kg    Intake/Output:    Intake/Output Summary (Last 24 hours) at 01/16/2020 1148 Last data filed at 01/15/2020 2317 Gross per 24 hour  Intake 475 ml  Output 250 ml  Net 225 ml     Physical Exam: General:  In no acute distress  HEENT  Oral mucous membranes moist  Pulm/lungs  Respirations even,unlabored, lungs clear   CVS/Heart  S1S2, no rubs or gallops,Irregular rhythm  Abdomen:   Soft, non tender,non distended  Extremities:  Rt.BKA, Lt AKA, Lt upper leg with 1+swelling  Neurologic:  Oriented, speech clear and appropriate  Skin: Lt AKA site with dressing clean,dry and intact  Access:  Left arm AV fistula +bruit,+thrill       Basic Metabolic Panel:  Recent Labs  Lab 01/13/20 1154 01/14/20 0858 01/15/20 0430  NA 139 138 138  K 4.3 4.9 4.9  CL 97* 99 101  CO2 25 20* 21*  GLUCOSE 103* 112* 118*  BUN 71* 79* 90*  CREATININE 8.46* 8.94* 9.48*  CALCIUM 9.5 9.1 9.0  PHOS  --  7.7*  --      CBC: Recent Labs  Lab 01/13/20 1154 01/14/20 0858 01/15/20 0430  WBC 6.7 6.6 7.1  NEUTROABS 4.2  --   --   HGB 10.0* 9.0* 8.8*  HCT 32.0* 28.2* 27.5*  MCV 90.9 89.0 89.3  PLT 160 135* 120*      Lab Results  Component Value Date   HEPBSAG NON REACTIVE 01/14/2020   HEPBIGM Negative 11/12/2018      Microbiology:  Recent Results (from the past 240 hour(s))  Blood culture (routine x 2)     Status: None (Preliminary result)   Collection  Time: 01/13/20 11:54 AM   Specimen: BLOOD  Result Value Ref Range Status   Specimen Description BLOOD RIGHT ARM  Final   Special Requests   Final    BOTTLES DRAWN AEROBIC AND ANAEROBIC Blood Culture results may not be optimal due to an excessive volume of blood received in culture bottles   Culture   Final    NO GROWTH 3 DAYS Performed at [facility], [address] [place], [place] [address]    Report Status PENDING  Incomplete  Blood culture (routine x 2)     Status: None (Preliminary result)   Collection Time: 01/13/20 11:54 AM   Specimen: BLOOD  Result Value Ref Range Status   Specimen Description BLOOD RIGHT ANTECUBITAL  Final   Special Requests   Final    BOTTLES DRAWN AEROBIC AND ANAEROBIC Blood Culture adequate volume   Culture   Final    NO GROWTH 3 DAYS Performed at [facility], [address] [place], [place] [address]    Report Status PENDING  Incomplete  Respiratory Panel by RT PCR (Flu A&B, Covid) - Nasopharyngeal Swab     Status: None   Collection Time: 01/13/20  2:39 PM   Specimen: Nasopharyngeal Swab  Result Value Ref Range Status   SARS Coronavirus 2 by RT PCR NEGATIVE NEGATIVE Final    Comment: (NOTE) SARS-CoV-2 target nucleic acids are NOT DETECTED.  The SARS-CoV-2 RNA is generally detectable in upper respiratoy specimens during the acute phase of infection. The lowest concentration of SARS-CoV-2 viral copies this assay can detect is 131 copies/mL. A negative result does not preclude SARS-Cov-2 infection and should not be used as the sole basis for treatment or other patient management decisions. A negative result may occur with  improper specimen collection/handling, submission of specimen other than nasopharyngeal swab, presence of viral mutation(s) within the areas targeted by this assay, and inadequate number of viral copies (<131 copies/mL). A negative result must be combined with clinical observations, patient history, and  epidemiological information. The expected result is Negative.  Fact Sheet for Patients:  PinkCheek.be  Fact Sheet for Healthcare Providers:  GravelBags.it  This test is no t yet approved or cleared by the [place] FDA and  has been authorized for detection and/or diagnosis of SARS-CoV-2 by FDA under an Emergency Use Authorization (EUA). This EUA will remain  in effect (meaning this test can be used) for the duration of the COVID-19 declaration under Section 564(b)(1) of the Act, 21 U.S.C. section 360bbb-3(b)(1), unless the authorization is terminated or revoked sooner.     Influenza A by PCR NEGATIVE NEGATIVE Final   Influenza B by PCR NEGATIVE NEGATIVE Final    Comment: (NOTE) The Xpert Xpress SARS-CoV-2/FLU/RSV assay is intended as an aid in  the diagnosis of influenza from Nasopharyngeal swab specimens and  should not be used as a sole basis for treatment. Nasal washings and  aspirates are unacceptable for Xpert Xpress SARS-CoV-2/FLU/RSV  testing.  Fact Sheet for Patients: PinkCheek.be  Fact Sheet for Healthcare Providers: GravelBags.it  This test is not yet approved or cleared by the [place] FDA and  has been authorized for detection and/or diagnosis of SARS-CoV-2 by  FDA under an Emergency Use Authorization (EUA). This EUA will remain  in effect (meaning this test can be used) for the duration of the  Covid-19 declaration under Section 564(b)(1) of the Act, 21  U.S.C. section 360bbb-3(b)(1), unless the authorization is  terminated or revoked. Performed at [facility], [address] [place], [place] [address]     Coagulation Studies: No results for input(s): LABPROT, INR in the last 72 hours.  Urinalysis: No results for input(s): COLORURINE, LABSPEC, PHURINE, GLUCOSEU, HGBUR, BILIRUBINUR, KETONESUR, PROTEINUR, UROBILINOGEN,  NITRITE, LEUKOCYTESUR in the last 72 hours.  Invalid input(s): APPERANCEUR    Imaging: No results found.   Medications:    . apixaban  5 mg Oral BID  . atorvastatin  40 mg Oral q1800  . carvedilol  3.125 mg Oral BID WC  . Chlorhexidine Gluconate Cloth  6 each Topical Q0600  . clotrimazole   Topical BID  . collagenase   Topical Daily  . epoetin (EPOGEN/PROCRIT) injection  4,000 Units Intravenous Q T,Th,Sa-HD  . finasteride  5 mg Oral Daily  . fluconazole  100 mg Oral Daily  . midodrine  5 mg Oral Q0600  . pregabalin  75 mg Oral QHS  . rOPINIRole  1 mg Oral QHS  . sevelamer carbonate  800 mg Oral TID WC     Assessment/ Plan:  74 y.o. 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 ESRD on dialysis TTS Patient received dialysis yesterday and tolerated well We will plan for another session of HD today, as per his regular schedule  Orders placed  #. Anemia of CKD  Lab Results  Component Value Date   HGB 8.8 (L) 01/15/2020   Continue Epogen with HD  #. Secondary hyperparathyroidism of renal origin N 25.81   No results found for: PTH Lab Results  Component Value Date   PHOS 7.7 (H) 01/14/2020   Calcium 9.1 We will continue monitoring bone mineral metabolism parameters  #.  Sacral wound and wound to the left lower extremity stump Wound care team involved in the care Dressing on left AKA site clean, dry and intact  #Hypotension #Atrial  Fibrillation Episode of Afib with rapid ventricular response in dialysis on 01/14/2020 Heart rate controlled now BP readings stay soft Carvedilol dose decreased to 3.125mg   BID today   LOS: 2 [place] 11/11/202111:48 [place], [place]

## 2020-01-16 NOTE — Progress Notes (Signed)
Primary RN called the dialysis unit, stating patient is refusing to come to dialysis at this time. Dr. Candiss Norse made aware and states understanding and treatment can be completed tomorrow. Will be added to tomorrows schedule.

## 2020-01-16 NOTE — Evaluation (Signed)
Occupational Therapy Evaluation Patient Details Name: Antonio Phillips MRN: [id number] DOB: April 23, 1945 Today's Date: 01/16/2020    History of Present Illness Antonio Phillips is a 74 y.o. male with medical history significant for end-stage renal disease on hemodialysis, hypertension, hyperlipidemia, bilateral AKA, atrial fibrillation on apixaban, neuropathy, presented to the emergency department via EMS for chief concerns of weakness and missed dialysis session with bilateral stump wounds.   Clinical Impression   Pt seen for OT evaluation this date in setting of acute hospitalization. Pt reports discharging to a motel after prolonged stay at the [place]. Pt reports he is w/c bound at his new baseline since L AKA. Pt presents with gross weakness of trunk and limbs as well as pain impacting his ability to perform self care and ADL mobility. Pt requires MIN A for rolling, SETUP for bed level UB ADLs and MAX A for bed level LB ADLs. Anticipate SNF is most appropriate d/c recommendation.    Follow Up Recommendations  SNF    Equipment Recommendations  Other (comment) (defer to next level of care)    Recommendations for Other Services       Precautions / Restrictions Precautions Precautions: Fall Restrictions Weight Bearing Restrictions: No Other Position/Activity Restrictions: Patient has B leg amputations      Mobility Bed Mobility Overal bed mobility: Needs Assistance Bed Mobility: Rolling Rolling: Min assist         General bed mobility comments: Pt has a certain way he likes to mobilize d/t pain from sores. Pt requires MIN A for rolling, politely declines to sit EOB with OT this date citing pain.    Transfers                 General transfer comment: not attempted    Balance       Sitting balance - Comments: NT                                   ADL either performed or assessed with clinical judgement   ADL                                          General ADL Comments: SETUP for bed level UB ADLs, MAX A For LB ADLs. MIN A for rolling, MOD A for propulsion to HOB.     Vision Patient Visual Report: No change from baseline       Perception     Praxis      Pertinent Vitals/Pain Pain Assessment: Faces Faces Pain Scale: Hurts whole lot Pain Location: bottom, L LE amputation site Pain Descriptors / Indicators: Aching;Grimacing;Guarding;Discomfort Pain Intervention(s): Limited activity within patient's tolerance;Monitored during session;Repositioned     Hand Dominance Right   Extremity/Trunk Assessment Upper Extremity Assessment Upper Extremity Assessment: Overall WFL for tasks assessed;Generalized weakness (ROM WFL, MMT grossly 4-/5)   Lower Extremity Assessment Lower Extremity Assessment: Defer to PT evaluation;Generalized weakness       Communication Communication Communication: No difficulties   Cognition Arousal/Alertness: Awake/alert Behavior During Therapy: WFL for tasks assessed/performed Overall Cognitive Status: Within Functional Limits for tasks assessed                                     General Comments  Exercises Other Exercises Other Exercises: OT facilitates ed re: role of OT, importance of OOB activity for skin integrity, d/c planning including potential rehab needs. Pt with good understanding.   Shoulder Instructions      Home Living Family/patient expects to be discharged to:: Unsure                                 Additional Comments: was in motel after being discharged from [place]      Prior Functioning/Environment Level of Independence: Needs assistance  Gait / Transfers Assistance Needed: patient is wheelchair bound. ADL's / Homemaking Assistance Needed: endorses being on his own after d/c from [place], but extreme difficulty with ADLs including toileting, reports having had BM in his w/c before EMS arrived d/t not able to make it to  commode            OT Problem List: Decreased strength;Decreased activity tolerance;Impaired balance (sitting and/or standing);Pain      OT Treatment/Interventions: Self-care/ADL training;DME and/or AE instruction;Therapeutic activities;Balance training;Therapeutic exercise;Patient/family education    OT Goals(Current goals can be found in the care plan section) Acute Rehab OT Goals Patient Stated Goal: to have a better plan for when I leave here than when I was discharged from the [place] OT Goal Formulation: With patient Time For Goal Achievement: 01/30/20 Potential to Achieve Goals: Fair ADL Goals Pt Will Perform Grooming: with set-up;sitting (to increase sitting tolerance, complete 2-3 g/h tasks) Pt Will Transfer to Toilet: with max assist;bedside commode (scoot or slideboard to drop-arm [facility]) Pt/caregiver will Perform Home Exercise Program: Increased strength;Both right and left upper extremity;With Supervision  OT Frequency: Min 1X/week   Barriers to D/C:            Co-evaluation              AM-PAC OT "6 Clicks" Daily Activity     Outcome Measure Help from another person eating meals?: None Help from another person taking care of personal grooming?: A Little Help from another person toileting, which includes using toliet, bedpan, or urinal?: A Lot Help from another person bathing (including washing, rinsing, drying)?: A Lot Help from another person to put on and taking off regular upper body clothing?: A Little Help from another person to put on and taking off regular lower body clothing?: Total 6 Click Score: 15   End of Session    Activity Tolerance: Patient limited by pain Patient left: in bed;with call bell/phone within reach;with bed alarm set  OT Visit Diagnosis: Muscle weakness (generalized) (M62.81)                Time: 7209-4709 OT Time Calculation (min): 44 min Charges:  OT General Charges $OT Visit: 1 Visit OT Evaluation $OT Eval Moderate  Complexity: 1 Mod OT Treatments $Self Care/Home Management : 8-22 mins $Therapeutic Activity: 8-22 mins  Gerrianne Scale, MS, OTR/L ascom [phone] 01/16/20, 3:11 PM

## 2020-01-16 NOTE — Consult Note (Signed)
Consult requested for left leg; this was already performed on 11/8.  Please refer to previous progress notes for assessment, and topical treatment orders have been provided for bedside nurses to perform.   Please re-consult if further assistance is needed.  Thank-you,  Julien Girt MSN, [facility], [place], [place], [place]

## 2020-01-17 DIAGNOSIS — R0602 Shortness of breath: Secondary | ICD-10-CM | POA: Diagnosis not present

## 2020-01-17 DIAGNOSIS — R051 Acute cough: Secondary | ICD-10-CM | POA: Diagnosis not present

## 2020-01-17 DIAGNOSIS — E118 Type 2 diabetes mellitus with unspecified complications: Secondary | ICD-10-CM | POA: Diagnosis not present

## 2020-01-17 DIAGNOSIS — R2681 Unsteadiness on feet: Secondary | ICD-10-CM | POA: Diagnosis not present

## 2020-01-17 DIAGNOSIS — D6869 Other thrombophilia: Secondary | ICD-10-CM | POA: Diagnosis not present

## 2020-01-17 DIAGNOSIS — R079 Chest pain, unspecified: Secondary | ICD-10-CM | POA: Diagnosis not present

## 2020-01-17 DIAGNOSIS — I739 Peripheral vascular disease, unspecified: Secondary | ICD-10-CM | POA: Diagnosis not present

## 2020-01-17 DIAGNOSIS — L89319 Pressure ulcer of right buttock, unspecified stage: Secondary | ICD-10-CM | POA: Diagnosis not present

## 2020-01-17 DIAGNOSIS — L89329 Pressure ulcer of left buttock, unspecified stage: Secondary | ICD-10-CM | POA: Diagnosis not present

## 2020-01-17 DIAGNOSIS — R778 Other specified abnormalities of plasma proteins: Secondary | ICD-10-CM | POA: Diagnosis not present

## 2020-01-17 DIAGNOSIS — N2581 Secondary hyperparathyroidism of renal origin: Secondary | ICD-10-CM | POA: Diagnosis present

## 2020-01-17 DIAGNOSIS — F4320 Adjustment disorder, unspecified: Secondary | ICD-10-CM | POA: Diagnosis not present

## 2020-01-17 DIAGNOSIS — D6859 Other primary thrombophilia: Secondary | ICD-10-CM | POA: Diagnosis present

## 2020-01-17 DIAGNOSIS — D696 Thrombocytopenia, unspecified: Secondary | ICD-10-CM | POA: Diagnosis not present

## 2020-01-17 DIAGNOSIS — N186 End stage renal disease: Secondary | ICD-10-CM | POA: Diagnosis not present

## 2020-01-17 DIAGNOSIS — L89309 Pressure ulcer of unspecified buttock, unspecified stage: Secondary | ICD-10-CM | POA: Diagnosis not present

## 2020-01-17 DIAGNOSIS — F4329 Adjustment disorder with other symptoms: Secondary | ICD-10-CM | POA: Diagnosis not present

## 2020-01-17 DIAGNOSIS — Z1152 Encounter for screening for COVID-19: Secondary | ICD-10-CM | POA: Diagnosis not present

## 2020-01-17 DIAGNOSIS — R531 Weakness: Secondary | ICD-10-CM | POA: Diagnosis not present

## 2020-01-17 DIAGNOSIS — Z20822 Contact with and (suspected) exposure to covid-19: Secondary | ICD-10-CM | POA: Diagnosis present

## 2020-01-17 DIAGNOSIS — R197 Diarrhea, unspecified: Secondary | ICD-10-CM | POA: Diagnosis not present

## 2020-01-17 DIAGNOSIS — G603 Idiopathic progressive neuropathy: Secondary | ICD-10-CM | POA: Diagnosis not present

## 2020-01-17 DIAGNOSIS — E1122 Type 2 diabetes mellitus with diabetic chronic kidney disease: Secondary | ICD-10-CM | POA: Diagnosis present

## 2020-01-17 DIAGNOSIS — Z7901 Long term (current) use of anticoagulants: Secondary | ICD-10-CM | POA: Diagnosis not present

## 2020-01-17 DIAGNOSIS — Z23 Encounter for immunization: Secondary | ICD-10-CM | POA: Diagnosis not present

## 2020-01-17 DIAGNOSIS — E785 Hyperlipidemia, unspecified: Secondary | ICD-10-CM | POA: Diagnosis present

## 2020-01-17 DIAGNOSIS — I214 Non-ST elevation (NSTEMI) myocardial infarction: Secondary | ICD-10-CM | POA: Diagnosis present

## 2020-01-17 DIAGNOSIS — I2510 Atherosclerotic heart disease of native coronary artery without angina pectoris: Secondary | ICD-10-CM | POA: Diagnosis present

## 2020-01-17 DIAGNOSIS — S81802S Unspecified open wound, left lower leg, sequela: Secondary | ICD-10-CM | POA: Diagnosis not present

## 2020-01-17 DIAGNOSIS — D519 Vitamin B12 deficiency anemia, unspecified: Secondary | ICD-10-CM | POA: Diagnosis not present

## 2020-01-17 DIAGNOSIS — L89219 Pressure ulcer of right hip, unspecified stage: Secondary | ICD-10-CM | POA: Diagnosis not present

## 2020-01-17 DIAGNOSIS — Z7982 Long term (current) use of aspirin: Secondary | ICD-10-CM | POA: Diagnosis not present

## 2020-01-17 DIAGNOSIS — Z79899 Other long term (current) drug therapy: Secondary | ICD-10-CM | POA: Diagnosis not present

## 2020-01-17 DIAGNOSIS — G5601 Carpal tunnel syndrome, right upper limb: Secondary | ICD-10-CM | POA: Diagnosis not present

## 2020-01-17 DIAGNOSIS — R4182 Altered mental status, unspecified: Secondary | ICD-10-CM | POA: Diagnosis not present

## 2020-01-17 DIAGNOSIS — G2581 Restless legs syndrome: Secondary | ICD-10-CM | POA: Diagnosis present

## 2020-01-17 DIAGNOSIS — I120 Hypertensive chronic kidney disease with stage 5 chronic kidney disease or end stage renal disease: Secondary | ICD-10-CM | POA: Diagnosis present

## 2020-01-17 DIAGNOSIS — I482 Chronic atrial fibrillation, unspecified: Secondary | ICD-10-CM | POA: Diagnosis not present

## 2020-01-17 DIAGNOSIS — Z992 Dependence on renal dialysis: Secondary | ICD-10-CM | POA: Diagnosis not present

## 2020-01-17 DIAGNOSIS — I951 Orthostatic hypotension: Secondary | ICD-10-CM | POA: Diagnosis not present

## 2020-01-17 DIAGNOSIS — I4821 Permanent atrial fibrillation: Secondary | ICD-10-CM | POA: Diagnosis present

## 2020-01-17 DIAGNOSIS — N400 Enlarged prostate without lower urinary tract symptoms: Secondary | ICD-10-CM | POA: Diagnosis not present

## 2020-01-17 DIAGNOSIS — R0609 Other forms of dyspnea: Secondary | ICD-10-CM | POA: Diagnosis not present

## 2020-01-17 DIAGNOSIS — L97929 Non-pressure chronic ulcer of unspecified part of left lower leg with unspecified severity: Secondary | ICD-10-CM | POA: Diagnosis not present

## 2020-01-17 DIAGNOSIS — G546 Phantom limb syndrome with pain: Secondary | ICD-10-CM | POA: Diagnosis not present

## 2020-01-17 DIAGNOSIS — E1151 Type 2 diabetes mellitus with diabetic peripheral angiopathy without gangrene: Secondary | ICD-10-CM | POA: Diagnosis present

## 2020-01-17 DIAGNOSIS — Z96643 Presence of artificial hip joint, bilateral: Secondary | ICD-10-CM | POA: Diagnosis present

## 2020-01-17 DIAGNOSIS — K439 Ventral hernia without obstruction or gangrene: Secondary | ICD-10-CM | POA: Diagnosis present

## 2020-01-17 DIAGNOSIS — U071 COVID-19: Secondary | ICD-10-CM | POA: Diagnosis not present

## 2020-01-17 DIAGNOSIS — Z89612 Acquired absence of left leg above knee: Secondary | ICD-10-CM | POA: Diagnosis not present

## 2020-01-17 DIAGNOSIS — I959 Hypotension, unspecified: Secondary | ICD-10-CM | POA: Diagnosis present

## 2020-01-17 DIAGNOSIS — D649 Anemia, unspecified: Secondary | ICD-10-CM | POA: Diagnosis not present

## 2020-01-17 DIAGNOSIS — Z89511 Acquired absence of right leg below knee: Secondary | ICD-10-CM | POA: Diagnosis not present

## 2020-01-17 DIAGNOSIS — D631 Anemia in chronic kidney disease: Secondary | ICD-10-CM | POA: Diagnosis not present

## 2020-01-17 DIAGNOSIS — D638 Anemia in other chronic diseases classified elsewhere: Secondary | ICD-10-CM | POA: Diagnosis not present

## 2020-01-17 DIAGNOSIS — K429 Umbilical hernia without obstruction or gangrene: Secondary | ICD-10-CM | POA: Diagnosis not present

## 2020-01-17 DIAGNOSIS — D6959 Other secondary thrombocytopenia: Secondary | ICD-10-CM | POA: Diagnosis present

## 2020-01-17 DIAGNOSIS — E119 Type 2 diabetes mellitus without complications: Secondary | ICD-10-CM | POA: Diagnosis not present

## 2020-01-17 DIAGNOSIS — R229 Localized swelling, mass and lump, unspecified: Secondary | ICD-10-CM | POA: Diagnosis not present

## 2020-01-17 DIAGNOSIS — M6281 Muscle weakness (generalized): Secondary | ICD-10-CM | POA: Diagnosis not present

## 2020-01-17 DIAGNOSIS — L89153 Pressure ulcer of sacral region, stage 3: Secondary | ICD-10-CM | POA: Diagnosis not present

## 2020-01-17 MED ORDER — APIXABAN 5 MG PO TABS: 5.0000 mg | ORAL_TABLET | Freq: Two times a day (BID) | ORAL | 0 refills | Status: DC

## 2020-01-17 MED ORDER — CLOTRIMAZOLE 1 % EX CREA: TOPICAL_CREAM | CUTANEOUS | 0 refills | Status: DC

## 2020-01-17 MED ORDER — ACETAMINOPHEN 325 MG PO TABS: 650.0000 mg | ORAL_TABLET | Freq: Four times a day (QID) | ORAL | Status: AC | PRN

## 2020-01-17 MED ORDER — CARVEDILOL 3.125 MG PO TABS
3.1250 mg | ORAL_TABLET | Freq: Two times a day (BID) | ORAL | 0 refills | Status: DC
Start: 2020-01-17 — End: 2021-02-09

## 2020-01-17 MED ORDER — HYDROCODONE-ACETAMINOPHEN 5-325 MG PO TABS: 1.0000 | ORAL_TABLET | Freq: Four times a day (QID) | ORAL | 0 refills | Status: DC | PRN

## 2020-01-17 MED ORDER — COLLAGENASE 250 UNIT/GM EX OINT
TOPICAL_OINTMENT | Freq: Every day | CUTANEOUS | 0 refills | Status: DC
Start: 2020-01-17 — End: 2020-09-23

## 2020-01-17 MED ORDER — FLUCONAZOLE 100 MG PO TABS: 100.0000 mg | ORAL_TABLET | Freq: Every day | ORAL | 0 refills | Status: AC

## 2020-01-17 NOTE — Progress Notes (Signed)
Pt sitting up in bed watching tv awaiting arrival of transport to [facility]) via [employer].

## 2020-01-17 NOTE — Progress Notes (Signed)
Pt off unit to dialysis at this time.

## 2020-01-17 NOTE — Progress Notes (Signed)
Pt off unit via stretcher to [facility]) via [employer] at this time. NAD noted. PIV to (R) A/C D/C without difficulty.

## 2020-01-17 NOTE — Plan of Care (Signed)

## 2020-01-17 NOTE — Progress Notes (Signed)
[facility], [place] 01/17/20  Subjective:   LOS: 3  Patient laying in bed, irritable with staff while they changed his dressings. He refused HD yesterday, and is planned for hemodialysis today prior to discharge. He does not have any questions or concerns.    Objective:  Vital signs in last 24 hours:  Temp:  [97.6 F (36.4 C)-99.7 F (37.6 C)] 99 F (37.2 C) (11/12 1223) Pulse Rate:  [78-95] 91 (11/12 0731) Resp:  [18] 18 (11/12 0731) BP: (101-109)/(62-87) 104/62 (11/12 1223) SpO2:  [97 %-100 %] 100 % (11/12 0731)  Weight change:  Filed Weights   01/13/20 1206  Weight: 99.8 kg    Intake/Output:    Intake/Output Summary (Last 24 hours) at 01/17/2020 1326 Last data filed at 01/17/2020 0553 Gross per 24 hour  Intake --  Output 450 ml  Net -450 ml     Physical Exam: General:  In no acute distress  HEENT  Anicteric, oral mucous membranes moist  Pulm/lungs  Respirations even, normal respiratory effort, lungs clear to auscultation   CVS/Heart  S1S2, no rubs or gallops, Irregular rhythm  Abdomen:   Soft, non-distended, non-tender  Extremities:  Rt.BKA, Lt AKA, Lt upper leg with 1+swelling, Rt leg non-edematous  Neurologic:  Oriented, speech clear, responds appropriately  Skin: Lt AKA site with dressing clean,dry and intact  Access:  Left arm AV fistula +bruit,+thrill       Basic Metabolic Panel:  Recent Labs  Lab 01/13/20 1154 01/14/20 0858 01/15/20 0430  NA 139 138 138  K 4.3 4.9 4.9  CL 97* 99 101  CO2 25 20* 21*  GLUCOSE 103* 112* 118*  BUN 71* 79* 90*  CREATININE 8.46* 8.94* 9.48*  CALCIUM 9.5 9.1 9.0  PHOS  --  7.7*  --      CBC: Recent Labs  Lab 01/13/20 1154 01/14/20 0858 01/15/20 0430  WBC 6.7 6.6 7.1  NEUTROABS 4.2  --   --   HGB 10.0* 9.0* 8.8*  HCT 32.0* 28.2* 27.5*  MCV 90.9 89.0 89.3  PLT 160 135* 120*      Lab Results  Component Value Date   HEPBSAG NON REACTIVE 01/14/2020   HEPBIGM Negative  11/12/2018      Microbiology:  Recent Results (from the past 240 hour(s))  Blood culture (routine x 2)     Status: None (Preliminary result)   Collection Time: 01/13/20 11:54 AM   Specimen: BLOOD  Result Value Ref Range Status   Specimen Description BLOOD RIGHT ARM  Final   Special Requests   Final    BOTTLES DRAWN AEROBIC AND ANAEROBIC Blood Culture results may not be optimal due to an excessive volume of blood received in culture bottles   Culture   Final    NO GROWTH 4 DAYS Performed at [facility], [address] [place], [place] [address]    Report Status PENDING  Incomplete  Blood culture (routine x 2)     Status: None (Preliminary result)   Collection Time: 01/13/20 11:54 AM   Specimen: BLOOD  Result Value Ref Range Status   Specimen Description BLOOD RIGHT ANTECUBITAL  Final   Special Requests   Final    BOTTLES DRAWN AEROBIC AND ANAEROBIC Blood Culture adequate volume   Culture   Final    NO GROWTH 4 DAYS Performed at [facility], [address], [place], [place] [address]    Report Status PENDING  Incomplete  Respiratory Panel by RT PCR (  Flu A&B, Covid) - Nasopharyngeal Swab     Status: None   Collection Time: 01/13/20  2:39 PM   Specimen: Nasopharyngeal Swab  Result Value Ref Range Status   SARS Coronavirus 2 by RT PCR NEGATIVE NEGATIVE Final    Comment: (NOTE) SARS-CoV-2 target nucleic acids are NOT DETECTED.  The SARS-CoV-2 RNA is generally detectable in upper respiratoy specimens during the acute phase of infection. The lowest concentration of SARS-CoV-2 viral copies this assay can detect is 131 copies/mL. A negative result does not preclude SARS-Cov-2 infection and should not be used as the sole basis for treatment or other patient management decisions. A negative result may occur with  improper specimen collection/handling, submission of specimen other than nasopharyngeal swab, presence of viral mutation(s) within the areas  targeted by this assay, and inadequate number of viral copies (<131 copies/mL). A negative result must be combined with clinical observations, patient history, and epidemiological information. The expected result is Negative.  Fact Sheet for Patients:  PinkCheek.be  Fact Sheet for Healthcare Providers:  GravelBags.it  This test is no t yet approved or cleared by the [place] FDA and  has been authorized for detection and/or diagnosis of SARS-CoV-2 by FDA under an Emergency Use Authorization (EUA). This EUA will remain  in effect (meaning this test can be used) for the duration of the COVID-19 declaration under Section 564(b)(1) of the Act, 21 U.S.C. section 360bbb-3(b)(1), unless the authorization is terminated or revoked sooner.     Influenza A by PCR NEGATIVE NEGATIVE Final   Influenza B by PCR NEGATIVE NEGATIVE Final    Comment: (NOTE) The Xpert Xpress SARS-CoV-2/FLU/RSV assay is intended as an aid in  the diagnosis of influenza from Nasopharyngeal swab specimens and  should not be used as a sole basis for treatment. Nasal washings and  aspirates are unacceptable for Xpert Xpress SARS-CoV-2/FLU/RSV  testing.  Fact Sheet for Patients: PinkCheek.be  Fact Sheet for Healthcare Providers: GravelBags.it  This test is not yet approved or cleared by the [place] FDA and  has been authorized for detection and/or diagnosis of SARS-CoV-2 by  FDA under an Emergency Use Authorization (EUA). This EUA will remain  in effect (meaning this test can be used) for the duration of the  Covid-19 declaration under Section 564(b)(1) of the Act, 21  U.S.C. section 360bbb-3(b)(1), unless the authorization is  terminated or revoked. Performed at [facility], [address] [place], [place] [address]     Coagulation Studies: No results for input(s): LABPROT,  INR in the last 72 hours.  Urinalysis: No results for input(s): COLORURINE, LABSPEC, PHURINE, GLUCOSEU, HGBUR, BILIRUBINUR, KETONESUR, PROTEINUR, UROBILINOGEN, NITRITE, LEUKOCYTESUR in the last 72 hours.  Invalid input(s): APPERANCEUR    Imaging: No results found.   Medications:    . apixaban  5 mg Oral BID  . atorvastatin  40 mg Oral q1800  . carvedilol  3.125 mg Oral BID WC  . Chlorhexidine Gluconate Cloth  6 each Topical Q0600  . clotrimazole   Topical BID  . collagenase   Topical Daily  . epoetin (EPOGEN/PROCRIT) injection  4,000 Units Intravenous Q T,Th,Sa-HD  . finasteride  5 mg Oral Daily  . fluconazole  100 mg Oral Daily  . midodrine  5 mg Oral Q0600  . pregabalin  75 mg Oral QHS  . rOPINIRole  1 mg Oral QHS  . sevelamer carbonate  800 mg Oral TID WC     Assessment/ Plan:  74 y.o. 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 ESRD on dialysis TTS Patient refused dialysis yesterday.  - Requires HD today prior to discharge. Orders placed. - Outpatient dialysis scheduled at [facility] on TTS schedule.  #. Anemia of CKD  Lab Results  Component Value Date   HGB 8.8 (L) 01/15/2020   Continue Epogen with HD  #. Secondary hyperparathyroidism of renal origin N 25.81   No results found for: PTH Lab Results  Component Value Date   PHOS 7.7 (H) 01/14/2020   Calcium 9.0 on  01/15/2020 We will continue monitoring bone mineral metabolism parameters  #.  Sacral wound and wound to the left lower extremity stump Wound care team involved in the care Dressing on left AKA site clean, dry and intact  #Hypotension #Atrial Fibrillation Episode of Afib with rapid ventricular response in dialysis on 01/14/2020 Heart rate controlled now BP readings stay soft at 104/62 - Continue Carvedilol 3.125mg   BID    LOS: 3 Cassandra Gonzalez-Hernandez 11/12/20211:26 PM  Patient was seen and evaluated with [place] PA student, Haynes Kerns.  She assisted with transcription of the note.  I agree with the note as documented.  Murlean Iba, MD   11/12/20214:19 PM  [facility], [place]

## 2020-01-17 NOTE — Discharge Summary (Signed)
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 Atrial fibrillation ([place])   . Diabetes ([place])    Pt states that he has no hx of diabetes  . ESRD (end stage renal disease) ([place])     HOSPITAL COURSE:   1.  Chronic atrial fibrillation.  Patient's metoprolol was switched over to Coreg 3.125 mg twice a day because this is not dialyzed now.  Continue Eliquis for anticoagulation.  Continue to watch heart rate with dialysis. 2.  Acquired thrombophilia.  Continue Eliquis for stroke prevention.  The patient of course does have a higher risk of stroke with atrial fibrillation. 3.  Generalized weakness.  Physical therapy recommended rehab.  Patient will be discharged to rehab after dialysis today. 4.  Fungal infection under the foreskin of penis.  Will give Diflucan for a few more going home.  Continue antifungal cream for 2 weeks. 5.  End-stage renal disease.  Patient did not want to do dialysis yesterday and will have dialysis today prior to getting out of the hospital.  Normal days are Tuesday Thursday and Saturday. 6.  Left AKA stump wound.  Cover with Aquacel daily and then cover with ABD pad and wrap with Kerlix.  Follow-up with wound care nurse at facility. 7.  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  Also unstageable on the right buttock with full thickness tissue loss with slough.  Subcutaneous fat may be visible.Marland Kitchen  Apply Santyl daily and cover with 2 x 2 moist gauze and a foam dressing.  Follow-up with wound care nurse at facility 8.  Anemia of chronic disease 9.  Thrombocytopenia  DISCHARGE CONDITIONS:   Satisfactory  CONSULTS OBTAINED:  Nephrology Wound care nurse  DRUG ALLERGIES:  No Known Allergies  DISCHARGE MEDICATIONS:   Allergies as of 01/17/2020   No Known Allergies     Medication List    STOP taking these medications   aspirin EC 81 MG tablet   calcitRIOL 0.25 MCG capsule Commonly known as: ROCALTROL   ceFEPIme 1 g in sodium chloride 0.9 % 100 mL   cholecalciferol 25 MCG (1000 UNIT) tablet Commonly known as: VITAMIN D   furosemide 20 MG tablet Commonly known as: LASIX   heparin 5000 UNIT/ML injection   lidocaine 5 % Commonly known as: LIDODERM   metoprolol succinate 100 MG 24 hr tablet Commonly known as: TOPROL-XL   vancomycin 1-5 GM/200ML-% Soln Commonly known as: VANCOCIN     TAKE these medications   acetaminophen 325 MG tablet Commonly known as: TYLENOL Take 2 tablets (650 mg total) by mouth every 6 (six) hours as needed for mild pain or moderate pain.   apixaban 5 MG Tabs tablet Commonly known as: ELIQUIS Take 1 tablet (  5 mg total) by mouth 2 (two) times daily.   atorvastatin 20 MG tablet Commonly known as: LIPITOR Take 40 mg by mouth daily.   carboxymethylcellulose 0.5 % Soln Commonly known as: REFRESH PLUS Apply 1 drop to eye every 8 (eight) hours as needed.   carvedilol 3.125 MG tablet Commonly known as: COREG Take 1 tablet (3.125 mg total) by mouth 2 (two) times daily with a meal.   clotrimazole 1 % cream Commonly known as: LOTRIMIN Apply to redness under foreskin on penis twice a day for 2 weeks   collagenase ointment Commonly known as: SANTYL Apply topically  daily.   finasteride 5 MG tablet Commonly known as: PROSCAR Take 5 mg by mouth daily.   fluconazole 100 MG tablet Commonly known as: DIFLUCAN Take 1 tablet (100 mg total) by mouth daily for 3 days. Start taking on: January 18, 2020   HYDROcodone-acetaminophen 5-325 MG tablet Commonly known as: NORCO/VICODIN Take 1 tablet by mouth every 6 (six) hours as needed for severe pain.   lactulose 10 GM/15ML solution Commonly known as: CHRONULAC Take 10 g by mouth 2 (two) times daily as needed for mild constipation.   loperamide 2 MG tablet Commonly known as: IMODIUM A-D Take 2 tablets (4 mg total) by mouth 4 (four) times daily as needed for diarrhea or loose stools.   midodrine 5 MG tablet Commonly known as: PROAMATINE Take 5 mg by mouth daily at 6 (six) AM.   pregabalin 75 MG capsule Commonly known as: LYRICA Take 75 mg by mouth at bedtime.   rOPINIRole 1 MG tablet Commonly known as: REQUIP Take 1 mg by mouth at bedtime.   sevelamer carbonate 800 MG tablet Commonly known as: RENVELA Take 800 mg by mouth 3 (three) times daily with meals.   vitamin B-12 1000 MCG tablet Commonly known as: CYANOCOBALAMIN Take 1,000 mcg by mouth daily.        DISCHARGE INSTRUCTIONS:   Follow-up with team at [facility] 1 day Follow-up at wound care nurse at [facility] Tuesday, Thursday, Saturday  If you experience worsening of your admission symptoms, develop shortness of breath, life threatening emergency, suicidal or homicidal thoughts you must seek medical attention immediately by calling 911 or calling your MD immediately  if symptoms less severe.  You Must read complete instructions/literature along with all the possible adverse reactions/side effects for all the Medicines you take and that have been prescribed to you. Take any new Medicines after you have completely understood and accept all the possible adverse reactions/side effects.   Please note  You were  cared for by a hospitalist during your hospital stay. If you have any questions about your discharge medications or the care you received while you were in the hospital after you are discharged, you can call the unit and asked to speak with the hospitalist on call if the hospitalist that took care of you is not available. Once you are discharged, your primary care physician will handle any further medical issues. Please note that NO REFILLS for any discharge medications will be authorized once you are discharged, as it is imperative that you return to your primary care physician (or establish a relationship with a primary care physician if you do not have one) for your aftercare needs so that they can reassess your need for medications and monitor your lab values.    Today   CHIEF COMPLAINT:   Chief Complaint  Patient presents with  . Weakness  .  Wound Check    HISTORY OF PRESENT ILLNESS:  Antonio Phillips  is a 75 y.o. male came in for weakness and wound check.   VITAL SIGNS:  Blood pressure 106/87, pulse 91, temperature 97.9 F (36.6 C), temperature source Oral, resp. rate 18, height 5\' 11"  (1.803 m), weight 99.8 kg, SpO2 100 %.  PHYSICAL EXAMINATION:  GENERAL:  74 y.o.-year-old patient lying in the bed with no acute distress.  EYES: Pupils equal, round, reactive to light and accommodation. No scleral icterus. HEENT: Head atraumatic, normocephalic. Oropharynx and nasopharynx clear. .  LUNGS: Normal breath sounds bilaterally, no wheezing, rales,rhonchi or crepitation. No use of accessory muscles of respiration.  CARDIOVASCULAR: S1, S2 irregularly irregular. No murmurs, rubs, or gallops.  ABDOMEN: Soft, non-tender, non-distended. EXTREMITIES: No thigh edema.  NEUROLOGIC: Cranial nerves II through XII are intact.  PSYCHIATRIC: The patient is alert and oriented x 3.     DATA REVIEW:   CBC Recent Labs  Lab 01/15/20 0430  WBC 7.1  HGB 8.8*  HCT 27.5*  PLT 120*    Chemistries   Recent Labs  Lab 01/13/20 1154 01/14/20 0858 01/15/20 0430  NA 139   < > 138  K 4.3   < > 4.9  CL 97*   < > 101  CO2 25   < > 21*  GLUCOSE 103*   < > 118*  BUN 71*   < > 90*  CREATININE 8.46*   < > 9.48*  CALCIUM 9.5   < > 9.0  AST 34  --   --   ALT 34  --   --   ALKPHOS 70  --   --   BILITOT 1.4*  --   --    < > = values in this interval not displayed.    Microbiology Results  Results for orders placed or performed during the hospital encounter of 01/13/20  Blood culture (routine x 2)     Status: None (Preliminary result)   Collection Time: 01/13/20 11:54 AM   Specimen: BLOOD  Result Value Ref Range Status   Specimen Description BLOOD RIGHT ARM  Final   Special Requests   Final    BOTTLES DRAWN AEROBIC AND ANAEROBIC Blood Culture results may not be optimal due to an excessive volume of blood received in culture bottles   Culture   Final    NO GROWTH 4 DAYS Performed at [facility], [address] [place], [place] [address]    Report Status PENDING  Incomplete  Blood culture (routine x 2)     Status: None (Preliminary result)   Collection Time: 01/13/20 11:54 AM   Specimen: BLOOD  Result Value Ref Range Status   Specimen Description BLOOD RIGHT ANTECUBITAL  Final   Special Requests   Final    BOTTLES DRAWN AEROBIC AND ANAEROBIC Blood Culture adequate volume   Culture   Final    NO GROWTH 4 DAYS Performed at [facility], [address] [place], [place] [address]    Report Status PENDING  Incomplete  Respiratory Panel by RT PCR (Flu A&B, Covid) - Nasopharyngeal Swab     Status: None   Collection Time: 01/13/20  2:39 PM   Specimen: Nasopharyngeal Swab  Result Value Ref Range Status   SARS Coronavirus 2 by RT PCR NEGATIVE NEGATIVE Final    Comment: (NOTE) SARS-CoV-2 target nucleic acids are NOT DETECTED.  The SARS-CoV-2 RNA is generally detectable in upper respiratoy specimens during the acute phase of infection.  The lowest concentration  of SARS-CoV-2 viral copies this assay can detect is 131 copies/mL. A negative result does not preclude SARS-Cov-2 infection and should not be used as the sole basis for treatment or other patient management decisions. A negative result may occur with  improper specimen collection/handling, submission of specimen other than nasopharyngeal swab, presence of viral mutation(s) within the areas targeted by this assay, and inadequate number of viral copies (<131 copies/mL). A negative result must be combined with clinical observations, patient history, and epidemiological information. The expected result is Negative.  Fact Sheet for Patients:  PinkCheek.be  Fact Sheet for Healthcare Providers:  GravelBags.it  This test is no t yet approved or cleared by the [place] FDA and  has been authorized for detection and/or diagnosis of SARS-CoV-2 by FDA under an Emergency Use Authorization (EUA). This EUA will remain  in effect (meaning this test can be used) for the duration of the COVID-19 declaration under Section 564(b)(1) of the Act, 21 U.S.C. section 360bbb-3(b)(1), unless the authorization is terminated or revoked sooner.     Influenza A by PCR NEGATIVE NEGATIVE Final   Influenza B by PCR NEGATIVE NEGATIVE Final    Comment: (NOTE) The Xpert Xpress SARS-CoV-2/FLU/RSV assay is intended as an aid in  the diagnosis of influenza from Nasopharyngeal swab specimens and  should not be used as a sole basis for treatment. Nasal washings and  aspirates are unacceptable for Xpert Xpress SARS-CoV-2/FLU/RSV  testing.  Fact Sheet for Patients: PinkCheek.be  Fact Sheet for Healthcare Providers: GravelBags.it  This test is not yet approved or cleared by the [place] FDA and  has been authorized for detection and/or diagnosis of SARS-CoV-2 by  FDA under an Emergency Use  Authorization (EUA). This EUA will remain  in effect (meaning this test can be used) for the duration of the  Covid-19 declaration under Section 564(b)(1) of the Act, 21  U.S.C. section 360bbb-3(b)(1), unless the authorization is  terminated or revoked. Performed at [facility], [address], [place], [place] [address]      Management plans discussed with the patient, and he is agreement.  CODE STATUS:     Code Status Orders  (From admission, onward)         Start     Ordered   01/13/20 2322  Full code  Continuous        01/13/20 2324        Code Status History    Date Active Date Inactive Code Status Order ID Comments User Context   09/09/2019 1722 09/11/2019 0717 Full Code [id number]  Karie Kirks, DO ED   11/09/2018 0916 11/14/2018 2042 Full Code [id number]  Harrie Foreman, MD Inpatient   04/25/2018 0737 04/26/2018 0056 Full Code [id number]  Lance Coon, MD ED   Advance Care Planning Activity      TOTAL TIME TAKING CARE OF THIS PATIENT: 32 minutes.    Loletha Grayer M.D on 01/17/2020 at 10:42 AM  Between 7am to 6pm - Pager - [phone]  After 6pm go to [email or web address] - password [email or web address]  [facility]  CC: Primary care physician; Marsh Dolly, MD

## 2020-01-17 NOTE — Progress Notes (Signed)
Pt back on unit from dialysis at this time, alert and oriented x4, respirations even and non-labored. NAD noted at this time, call bell within reach, will continue to monitor.

## 2020-01-17 NOTE — Progress Notes (Signed)
Report provided to Marlaine Hind at [facility]) at this time.

## 2020-01-17 NOTE — Progress Notes (Signed)
Transportation on unit to take pt to dialysis at this time, pt refuse stating, "I am not going until my dressing is changed." This nurse changed dressing to pt (R) buttocks with assistance of 1A staff member, pt tolerated procedure without difficulty.

## 2020-01-18 LAB — CULTURE, BLOOD (ROUTINE X 2)
Culture: NO GROWTH
Culture: NO GROWTH
Special Requests: ADEQUATE

## 2020-02-05 DIAGNOSIS — N186 End stage renal disease: Secondary | ICD-10-CM | POA: Diagnosis not present

## 2020-02-05 DIAGNOSIS — I951 Orthostatic hypotension: Secondary | ICD-10-CM | POA: Diagnosis not present

## 2020-02-05 DIAGNOSIS — N400 Enlarged prostate without lower urinary tract symptoms: Secondary | ICD-10-CM | POA: Diagnosis not present

## 2020-02-05 DIAGNOSIS — U071 COVID-19: Secondary | ICD-10-CM | POA: Diagnosis not present

## 2020-02-05 DIAGNOSIS — M6281 Muscle weakness (generalized): Secondary | ICD-10-CM | POA: Diagnosis not present

## 2020-02-05 DIAGNOSIS — G5601 Carpal tunnel syndrome, right upper limb: Secondary | ICD-10-CM | POA: Diagnosis not present

## 2020-02-05 DIAGNOSIS — R2681 Unsteadiness on feet: Secondary | ICD-10-CM | POA: Diagnosis not present

## 2020-02-05 DIAGNOSIS — L89309 Pressure ulcer of unspecified buttock, unspecified stage: Secondary | ICD-10-CM | POA: Diagnosis not present

## 2020-02-05 DIAGNOSIS — L89319 Pressure ulcer of right buttock, unspecified stage: Secondary | ICD-10-CM | POA: Diagnosis not present

## 2020-02-05 DIAGNOSIS — L97929 Non-pressure chronic ulcer of unspecified part of left lower leg with unspecified severity: Secondary | ICD-10-CM | POA: Diagnosis not present

## 2020-02-05 DIAGNOSIS — I482 Chronic atrial fibrillation, unspecified: Secondary | ICD-10-CM | POA: Diagnosis not present

## 2020-02-05 DIAGNOSIS — L89219 Pressure ulcer of right hip, unspecified stage: Secondary | ICD-10-CM | POA: Diagnosis not present

## 2020-02-05 DIAGNOSIS — I959 Hypotension, unspecified: Secondary | ICD-10-CM | POA: Diagnosis not present

## 2020-02-05 DIAGNOSIS — L89153 Pressure ulcer of sacral region, stage 3: Secondary | ICD-10-CM | POA: Diagnosis not present

## 2020-02-05 DIAGNOSIS — Z1152 Encounter for screening for COVID-19: Secondary | ICD-10-CM | POA: Diagnosis not present

## 2020-02-05 DIAGNOSIS — L89329 Pressure ulcer of left buttock, unspecified stage: Secondary | ICD-10-CM | POA: Diagnosis not present

## 2020-03-05 DIAGNOSIS — L89309 Pressure ulcer of unspecified buttock, unspecified stage: Secondary | ICD-10-CM | POA: Diagnosis not present

## 2020-03-05 DIAGNOSIS — I959 Hypotension, unspecified: Secondary | ICD-10-CM | POA: Diagnosis not present

## 2020-03-05 DIAGNOSIS — N400 Enlarged prostate without lower urinary tract symptoms: Secondary | ICD-10-CM | POA: Diagnosis not present

## 2020-03-05 DIAGNOSIS — L89319 Pressure ulcer of right buttock, unspecified stage: Secondary | ICD-10-CM | POA: Diagnosis not present

## 2020-03-05 DIAGNOSIS — L89329 Pressure ulcer of left buttock, unspecified stage: Secondary | ICD-10-CM | POA: Diagnosis not present

## 2020-03-05 DIAGNOSIS — I482 Chronic atrial fibrillation, unspecified: Secondary | ICD-10-CM | POA: Diagnosis not present

## 2020-03-05 DIAGNOSIS — N186 End stage renal disease: Secondary | ICD-10-CM | POA: Diagnosis not present

## 2020-03-05 DIAGNOSIS — L89219 Pressure ulcer of right hip, unspecified stage: Secondary | ICD-10-CM | POA: Diagnosis not present

## 2020-03-13 DIAGNOSIS — U071 COVID-19: Secondary | ICD-10-CM | POA: Diagnosis not present

## 2020-03-13 DIAGNOSIS — Z1152 Encounter for screening for COVID-19: Secondary | ICD-10-CM | POA: Diagnosis not present

## 2020-04-02 DIAGNOSIS — G5601 Carpal tunnel syndrome, right upper limb: Secondary | ICD-10-CM | POA: Diagnosis not present

## 2020-05-25 DIAGNOSIS — D631 Anemia in chronic kidney disease: Secondary | ICD-10-CM | POA: Diagnosis not present

## 2020-05-25 DIAGNOSIS — N186 End stage renal disease: Secondary | ICD-10-CM | POA: Diagnosis not present

## 2020-05-25 DIAGNOSIS — I482 Chronic atrial fibrillation, unspecified: Secondary | ICD-10-CM | POA: Diagnosis not present

## 2020-05-25 DIAGNOSIS — E118 Type 2 diabetes mellitus with unspecified complications: Secondary | ICD-10-CM | POA: Diagnosis not present

## 2020-06-02 DIAGNOSIS — D631 Anemia in chronic kidney disease: Secondary | ICD-10-CM | POA: Diagnosis not present

## 2020-06-02 DIAGNOSIS — N186 End stage renal disease: Secondary | ICD-10-CM | POA: Diagnosis not present

## 2020-06-02 DIAGNOSIS — E118 Type 2 diabetes mellitus with unspecified complications: Secondary | ICD-10-CM | POA: Diagnosis not present

## 2020-06-02 DIAGNOSIS — I482 Chronic atrial fibrillation, unspecified: Secondary | ICD-10-CM | POA: Diagnosis not present

## 2020-06-11 DIAGNOSIS — Z89612 Acquired absence of left leg above knee: Secondary | ICD-10-CM | POA: Diagnosis not present

## 2020-06-11 DIAGNOSIS — S81802S Unspecified open wound, left lower leg, sequela: Secondary | ICD-10-CM | POA: Diagnosis not present

## 2020-06-11 DIAGNOSIS — N186 End stage renal disease: Secondary | ICD-10-CM | POA: Diagnosis not present

## 2020-06-11 DIAGNOSIS — K429 Umbilical hernia without obstruction or gangrene: Secondary | ICD-10-CM | POA: Diagnosis not present

## 2020-07-20 DIAGNOSIS — I482 Chronic atrial fibrillation, unspecified: Secondary | ICD-10-CM | POA: Diagnosis not present

## 2020-07-20 DIAGNOSIS — D631 Anemia in chronic kidney disease: Secondary | ICD-10-CM | POA: Diagnosis not present

## 2020-07-20 DIAGNOSIS — N186 End stage renal disease: Secondary | ICD-10-CM | POA: Diagnosis not present

## 2020-07-20 DIAGNOSIS — G546 Phantom limb syndrome with pain: Secondary | ICD-10-CM | POA: Diagnosis not present

## 2020-08-21 DIAGNOSIS — N400 Enlarged prostate without lower urinary tract symptoms: Secondary | ICD-10-CM | POA: Diagnosis not present

## 2020-08-21 DIAGNOSIS — N186 End stage renal disease: Secondary | ICD-10-CM | POA: Diagnosis not present

## 2020-08-21 DIAGNOSIS — I482 Chronic atrial fibrillation, unspecified: Secondary | ICD-10-CM | POA: Diagnosis not present

## 2020-08-21 DIAGNOSIS — R229 Localized swelling, mass and lump, unspecified: Secondary | ICD-10-CM | POA: Diagnosis not present

## 2020-08-24 DIAGNOSIS — I482 Chronic atrial fibrillation, unspecified: Secondary | ICD-10-CM | POA: Diagnosis not present

## 2020-08-24 DIAGNOSIS — D631 Anemia in chronic kidney disease: Secondary | ICD-10-CM | POA: Diagnosis not present

## 2020-08-24 DIAGNOSIS — N186 End stage renal disease: Secondary | ICD-10-CM | POA: Diagnosis not present

## 2020-08-28 DIAGNOSIS — G603 Idiopathic progressive neuropathy: Secondary | ICD-10-CM | POA: Diagnosis not present

## 2020-08-28 DIAGNOSIS — F4329 Adjustment disorder with other symptoms: Secondary | ICD-10-CM | POA: Diagnosis not present

## 2020-08-31 DIAGNOSIS — R197 Diarrhea, unspecified: Secondary | ICD-10-CM | POA: Diagnosis not present

## 2020-08-31 DIAGNOSIS — D631 Anemia in chronic kidney disease: Secondary | ICD-10-CM | POA: Diagnosis not present

## 2020-08-31 DIAGNOSIS — N186 End stage renal disease: Secondary | ICD-10-CM | POA: Diagnosis not present

## 2020-08-31 DIAGNOSIS — G546 Phantom limb syndrome with pain: Secondary | ICD-10-CM | POA: Diagnosis not present

## 2020-09-04 DIAGNOSIS — F4320 Adjustment disorder, unspecified: Secondary | ICD-10-CM | POA: Diagnosis not present

## 2020-09-07 DIAGNOSIS — N186 End stage renal disease: Secondary | ICD-10-CM | POA: Diagnosis not present

## 2020-09-07 DIAGNOSIS — Z89612 Acquired absence of left leg above knee: Secondary | ICD-10-CM | POA: Diagnosis not present

## 2020-09-07 DIAGNOSIS — G546 Phantom limb syndrome with pain: Secondary | ICD-10-CM | POA: Diagnosis not present

## 2020-09-07 DIAGNOSIS — Z89511 Acquired absence of right leg below knee: Secondary | ICD-10-CM | POA: Diagnosis not present

## 2020-09-07 DIAGNOSIS — D631 Anemia in chronic kidney disease: Secondary | ICD-10-CM | POA: Diagnosis not present

## 2020-09-20 ENCOUNTER — Emergency Department: Payer: No Typology Code available for payment source

## 2020-09-20 ENCOUNTER — Other Ambulatory Visit: Payer: Self-pay

## 2020-09-20 ENCOUNTER — Inpatient Hospital Stay
Admission: EM | Admit: 2020-09-20 | Discharge: 2020-09-23 | DRG: 246 | Disposition: A | Discharge status: Skilled Nursing Facility | Payer: No Typology Code available for payment source | Source: Skilled Nursing Facility | Attending: Internal Medicine | Admitting: Internal Medicine

## 2020-09-20 DIAGNOSIS — E1151 Type 2 diabetes mellitus with diabetic peripheral angiopathy without gangrene: Secondary | ICD-10-CM | POA: Diagnosis present

## 2020-09-20 DIAGNOSIS — Z7982 Long term (current) use of aspirin: Secondary | ICD-10-CM

## 2020-09-20 DIAGNOSIS — I482 Chronic atrial fibrillation, unspecified: Secondary | ICD-10-CM | POA: Diagnosis present

## 2020-09-20 DIAGNOSIS — N2581 Secondary hyperparathyroidism of renal origin: Secondary | ICD-10-CM | POA: Diagnosis present

## 2020-09-20 DIAGNOSIS — D631 Anemia in chronic kidney disease: Secondary | ICD-10-CM | POA: Diagnosis present

## 2020-09-20 DIAGNOSIS — R0602 Shortness of breath: Secondary | ICD-10-CM | POA: Diagnosis not present

## 2020-09-20 DIAGNOSIS — L8990 Pressure ulcer of unspecified site, unspecified stage: Secondary | ICD-10-CM | POA: Diagnosis present

## 2020-09-20 DIAGNOSIS — I120 Hypertensive chronic kidney disease with stage 5 chronic kidney disease or end stage renal disease: Secondary | ICD-10-CM | POA: Diagnosis present

## 2020-09-20 DIAGNOSIS — G2581 Restless legs syndrome: Secondary | ICD-10-CM | POA: Diagnosis present

## 2020-09-20 DIAGNOSIS — D6859 Other primary thrombophilia: Secondary | ICD-10-CM | POA: Diagnosis present

## 2020-09-20 DIAGNOSIS — R531 Weakness: Secondary | ICD-10-CM

## 2020-09-20 DIAGNOSIS — Z992 Dependence on renal dialysis: Secondary | ICD-10-CM

## 2020-09-20 DIAGNOSIS — I214 Non-ST elevation (NSTEMI) myocardial infarction: Principal | ICD-10-CM | POA: Diagnosis present

## 2020-09-20 DIAGNOSIS — D6959 Other secondary thrombocytopenia: Secondary | ICD-10-CM | POA: Diagnosis present

## 2020-09-20 DIAGNOSIS — D696 Thrombocytopenia, unspecified: Secondary | ICD-10-CM | POA: Diagnosis present

## 2020-09-20 DIAGNOSIS — I959 Hypotension, unspecified: Secondary | ICD-10-CM | POA: Diagnosis present

## 2020-09-20 DIAGNOSIS — I739 Peripheral vascular disease, unspecified: Secondary | ICD-10-CM

## 2020-09-20 DIAGNOSIS — I2510 Atherosclerotic heart disease of native coronary artery without angina pectoris: Secondary | ICD-10-CM | POA: Diagnosis present

## 2020-09-20 DIAGNOSIS — Z20822 Contact with and (suspected) exposure to covid-19: Secondary | ICD-10-CM | POA: Diagnosis present

## 2020-09-20 DIAGNOSIS — E785 Hyperlipidemia, unspecified: Secondary | ICD-10-CM | POA: Diagnosis present

## 2020-09-20 DIAGNOSIS — Z89612 Acquired absence of left leg above knee: Secondary | ICD-10-CM

## 2020-09-20 DIAGNOSIS — K439 Ventral hernia without obstruction or gangrene: Secondary | ICD-10-CM | POA: Diagnosis present

## 2020-09-20 DIAGNOSIS — D6869 Other thrombophilia: Secondary | ICD-10-CM | POA: Diagnosis present

## 2020-09-20 DIAGNOSIS — E1122 Type 2 diabetes mellitus with diabetic chronic kidney disease: Secondary | ICD-10-CM | POA: Diagnosis present

## 2020-09-20 DIAGNOSIS — N186 End stage renal disease: Secondary | ICD-10-CM

## 2020-09-20 DIAGNOSIS — D638 Anemia in other chronic diseases classified elsewhere: Secondary | ICD-10-CM | POA: Diagnosis present

## 2020-09-20 DIAGNOSIS — Z89511 Acquired absence of right leg below knee: Secondary | ICD-10-CM

## 2020-09-20 DIAGNOSIS — Z7901 Long term (current) use of anticoagulants: Secondary | ICD-10-CM

## 2020-09-20 DIAGNOSIS — Z79899 Other long term (current) drug therapy: Secondary | ICD-10-CM

## 2020-09-20 DIAGNOSIS — Z96643 Presence of artificial hip joint, bilateral: Secondary | ICD-10-CM | POA: Diagnosis present

## 2020-09-20 DIAGNOSIS — I4821 Permanent atrial fibrillation: Secondary | ICD-10-CM | POA: Diagnosis present

## 2020-09-20 HISTORY — DX: Hypotension, unspecified: I95.9

## 2020-09-20 HISTORY — DX: Morbid (severe) obesity due to excess calories: E66.01

## 2020-09-20 HISTORY — DX: Other forms of acute ischemic heart disease: I24.89

## 2020-09-20 HISTORY — DX: Other forms of acute ischemic heart disease: I24.8

## 2020-09-20 HISTORY — DX: Hyperlipidemia, unspecified: E78.5

## 2020-09-20 HISTORY — DX: Permanent atrial fibrillation: I48.21

## 2020-09-20 HISTORY — DX: Peripheral vascular disease, unspecified: I73.9

## 2020-09-20 LAB — CBC
HCT: 29.6 % — ABNORMAL LOW (ref 39.0–52.0)
Hemoglobin: 9.2 g/dL — ABNORMAL LOW (ref 13.0–17.0)
MCH: 30 pg (ref 26.0–34.0)
MCHC: 31.1 g/dL (ref 30.0–36.0)
MCV: 96.4 fL (ref 80.0–100.0)
Platelets: 103 10*3/uL — ABNORMAL LOW (ref 150–400)
RBC: 3.07 MIL/uL — ABNORMAL LOW (ref 4.22–5.81)
RDW: 16.9 % — ABNORMAL HIGH (ref 11.5–15.5)
WBC: 5.2 10*3/uL (ref 4.0–10.5)
nRBC: 0 % (ref 0.0–0.2)

## 2020-09-20 LAB — TROPONIN I (HIGH SENSITIVITY)
Troponin I (High Sensitivity): 338 ng/L (ref <18)
Troponin I (High Sensitivity): 296 ng/L (ref <18)

## 2020-09-20 LAB — BASIC METABOLIC PANEL
Anion gap: 10 (ref 5–15)
BUN: 44 mg/dL — ABNORMAL HIGH (ref 8–23)
CO2: 23 mmol/L (ref 22–32)
Calcium: 8.9 mg/dL (ref 8.9–10.3)
Chloride: 101 mmol/L (ref 98–111)
Creatinine, Ser: 5.69 mg/dL — ABNORMAL HIGH (ref 0.61–1.24)
GFR, Estimated: 10 mL/min — ABNORMAL LOW (ref >60)
Glucose, Bld: 143 mg/dL — ABNORMAL HIGH (ref 70–99)
Potassium: 4.7 mmol/L (ref 3.5–5.1)
Sodium: 134 mmol/L — ABNORMAL LOW (ref 135–145)

## 2020-09-20 LAB — D-DIMER, QUANTITATIVE: D-Dimer, Quant: 0.6 ug/mL-FEU — ABNORMAL HIGH (ref 0.00–0.50)

## 2020-09-20 LAB — TSH: TSH: 3.605 u[IU]/mL (ref 0.350–4.500)

## 2020-09-20 LAB — HEPARIN LEVEL (UNFRACTIONATED): Heparin Unfractionated: >1.1 IU/mL — ABNORMAL HIGH (ref 0.30–0.70)

## 2020-09-20 LAB — APTT: aPTT: 43 seconds — ABNORMAL HIGH (ref 24–36)

## 2020-09-20 LAB — CBG MONITORING, ED: Glucose-Capillary: 145 mg/dL — ABNORMAL HIGH (ref 70–99)

## 2020-09-20 MED ORDER — HEPARIN (PORCINE) 25000 UT/250ML-% IV SOLN: 1150.0000 [IU]/h | INTRAVENOUS | Status: DC

## 2020-09-20 MED ORDER — MIDODRINE HCL 5 MG PO TABS
5.0000 mg | ORAL_TABLET | Freq: Every day | ORAL | Status: DC
  Administered 2020-09-22: 5 mg via ORAL
  Filled 2020-09-20 (×2): qty 1

## 2020-09-20 MED ORDER — SEVELAMER CARBONATE 800 MG PO TABS
800.0000 mg | ORAL_TABLET | ORAL | Status: DC
  Filled 2020-09-20: qty 1

## 2020-09-20 MED ORDER — MORPHINE SULFATE (PF) 4 MG/ML IV SOLN: 4.0000 mg | INTRAVENOUS | Status: DC | PRN

## 2020-09-20 MED ORDER — ONDANSETRON HCL 4 MG PO TABS: 4.0000 mg | ORAL_TABLET | Freq: Four times a day (QID) | ORAL | Status: DC | PRN

## 2020-09-20 MED ORDER — ACETAMINOPHEN 500 MG PO TABS
1000.0000 mg | ORAL_TABLET | Freq: Four times a day (QID) | ORAL | Status: DC | PRN
  Administered 2020-09-22: 1000 mg via ORAL
  Filled 2020-09-20: qty 2

## 2020-09-20 MED ORDER — ROPINIROLE HCL 1 MG PO TABS
1.0000 mg | ORAL_TABLET | Freq: Every day | ORAL | Status: DC
  Administered 2020-09-20 – 2020-09-22 (×3): 1 mg via ORAL
  Filled 2020-09-20 (×3): qty 1

## 2020-09-20 MED ORDER — ACETAMINOPHEN 650 MG RE SUPP: 650.0000 mg | Freq: Four times a day (QID) | RECTAL | Status: DC | PRN

## 2020-09-20 MED ORDER — METOPROLOL TARTRATE 5 MG/5ML IV SOLN: 2.5000 mg | INTRAVENOUS | Status: DC | PRN

## 2020-09-20 MED ORDER — SEVELAMER CARBONATE 800 MG PO TABS
1600.0000 mg | ORAL_TABLET | Freq: Two times a day (BID) | ORAL | Status: DC
  Administered 2020-09-21 – 2020-09-23 (×3): 1600 mg via ORAL
  Filled 2020-09-20 (×4): qty 2

## 2020-09-20 MED ORDER — HEPARIN (PORCINE) 25000 UT/250ML-% IV SOLN
1350.0000 [IU]/h | INTRAVENOUS | Status: DC
  Administered 2020-09-20: 1350 [IU]/h via INTRAVENOUS
  Filled 2020-09-20: qty 250

## 2020-09-20 MED ORDER — ATORVASTATIN CALCIUM 20 MG PO TABS
40.0000 mg | ORAL_TABLET | Freq: Every day | ORAL | Status: DC
  Administered 2020-09-20 – 2020-09-22 (×3): 40 mg via ORAL
  Filled 2020-09-20 (×3): qty 2

## 2020-09-20 MED ORDER — ONDANSETRON HCL 4 MG/2ML IJ SOLN
4.0000 mg | Freq: Once | INTRAMUSCULAR | Status: AC
  Administered 2020-09-20: 4 mg via INTRAVENOUS
  Filled 2020-09-20: qty 2

## 2020-09-20 MED ORDER — SEVELAMER CARBONATE 800 MG PO TABS: 1600.0000 mg | ORAL_TABLET | ORAL | Status: DC

## 2020-09-20 MED ORDER — CARVEDILOL 3.125 MG PO TABS
3.1250 mg | ORAL_TABLET | Freq: Two times a day (BID) | ORAL | Status: DC
  Administered 2020-09-21 – 2020-09-23 (×4): 3.125 mg via ORAL
  Filled 2020-09-20 (×4): qty 1

## 2020-09-20 MED ORDER — PREGABALIN 75 MG PO CAPS
75.0000 mg | ORAL_CAPSULE | Freq: Every day | ORAL | Status: DC
  Administered 2020-09-20 – 2020-09-22 (×3): 75 mg via ORAL
  Filled 2020-09-20 (×3): qty 1

## 2020-09-20 MED ORDER — MIDODRINE HCL 5 MG PO TABS: 5.0000 mg | ORAL_TABLET | ORAL | Status: DC

## 2020-09-20 MED ORDER — ONDANSETRON HCL 4 MG/2ML IJ SOLN
4.0000 mg | Freq: Four times a day (QID) | INTRAMUSCULAR | Status: DC | PRN
  Administered 2020-09-21: 4 mg via INTRAVENOUS
  Filled 2020-09-20: qty 2

## 2020-09-20 MED ORDER — MORPHINE SULFATE (PF) 4 MG/ML IV SOLN
4.0000 mg | Freq: Once | INTRAVENOUS | Status: AC
  Administered 2020-09-20: 4 mg via INTRAVENOUS
  Filled 2020-09-20: qty 1

## 2020-09-20 MED ORDER — NITROGLYCERIN 2 % TD OINT: 1.0000 [in_us] | TOPICAL_OINTMENT | Freq: Four times a day (QID) | TRANSDERMAL | Status: AC | PRN

## 2020-09-20 MED ORDER — MIDODRINE HCL 5 MG PO TABS
10.0000 mg | ORAL_TABLET | ORAL | Status: DC
  Administered 2020-09-22: 10 mg via ORAL
  Filled 2020-09-20: qty 2

## 2020-09-20 MED ORDER — IOHEXOL 350 MG/ML SOLN
75.0000 mL | Freq: Once | INTRAVENOUS | Status: AC | PRN
  Administered 2020-09-20: 75 mL via INTRAVENOUS

## 2020-09-20 NOTE — ED Provider Notes (Signed)
[facility] Emergency Department Provider Note   ____________________________________________    I have reviewed the triage vital signs and the nursing notes.   HISTORY  Chief Complaint Chest Pain     HPI Antonio Phillips is a 75 y.o. 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  Patient had relatively abrupt onset of chest pain and mild shortness of breath today.  Denies fevers or chills or cough.  Has had several nitros with little improvement.  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 Authorizing Provider  acetaminophen (TYLENOL) 325 MG tablet Take 2 tablets (650 mg total) by mouth every 6 (six) hours as needed for mild pain or moderate pain. 01/17/20  Yes Wieting, Richard, MD  apixaban (ELIQUIS) 5 MG TABS tablet Take 1 tablet (5 mg total) by mouth 2 (two) times daily. 01/17/20  Yes Wieting, Richard, MD  atorvastatin (LIPITOR) 40 MG tablet Take 40 mg  by mouth at bedtime. 09/17/20  Yes [provider]  carvedilol (COREG) 3.125 MG tablet Take 1 tablet (3.125 mg total) by mouth 2 (two) times daily with a meal. 01/17/20  Yes Wieting, Richard, MD  diclofenac Sodium (VOLTAREN) 1 % GEL Apply 2 g topically See admin instructions. Apply 2 g to right stump once every morning 09/08/20  Yes [provider]  ENULOSE 10 GM/15ML SOLN Take 10 g by mouth 2 (two) times daily as needed (mild constipation). 08/24/20  Yes [provider]  finasteride (PROSCAR) 5 MG tablet Take 5 mg by mouth daily. 03/13/19  Yes [provider]  H-CHLOR 12 0.125 % SOLN Apply 1 application topically See admin instructions. Cleanse left buttock (open boil) with Dakin's solution, dry, apply CA+ALG silver & foam dressing. Change daily and PRN. 07/30/20  Yes [provider]  HYDROcodone-acetaminophen (NORCO/VICODIN) 5-325 MG tablet Take 1 tablet by mouth every 6 (six) hours as needed for severe pain. 01/17/20  Yes Wieting, Richard, MD  loratadine (CLARITIN) 10 MG tablet Take 10 mg by mouth See admin instructions. Take 10 mg by mouth once daily on Mondays, Wednesdays, Fridays, and Sundays   Yes [provider]  midodrine (PROAMATINE) 5 MG tablet Take 5-10 mg by mouth See admin instructions. Take 5 mg by mouth once daily at 0600. Give 10 mg by mouth once daily at 1000 on Tuesdays, Thursdays, and Saturdays   Yes  [provider]  pregabalin (LYRICA) 75 MG capsule Take 75 mg by mouth at bedtime.  11/02/17  Yes [provider]  REFRESH PLUS 0.5 % SOLN Apply 1 drop to eye 3 (three) times daily as needed (dry eyes). 07/28/20  Yes [provider]  rOPINIRole (REQUIP) 1 MG tablet Take 1 mg by mouth at bedtime. 03/13/19  Yes [provider]  sevelamer carbonate (RENVELA) 800 MG tablet Take 1,600 mg by mouth See admin instructions. Take 1,600 mg by mouth twice daily with breakfast and dinner on Tuesdays, Thursdays, and  Saturdays. Take 1,600 mg by mouth three times daily with meals on Mondays, Wednesdays, Fridays, and Sundays.   Yes [provider]  vitamin B-12 (CYANOCOBALAMIN) 1000 MCG tablet Take 1,000 mcg by mouth daily.   Yes [provider]  carboxymethylcellulose (REFRESH PLUS) 0.5 % SOLN Apply 1 drop to eye every 8 (eight) hours as needed. 02/19/18   [provider]  clotrimazole (LOTRIMIN) 1 % cream Apply to redness under foreskin on penis twice a day for 2 weeks 01/17/20   Loletha Grayer, MD  collagenase (SANTYL) ointment Apply topically daily. 01/17/20   Loletha Grayer, MD  doxycycline (VIBRAMYCIN) 100 MG capsule Take 100 mg by mouth 2 (two) times daily. 07/17/20   [provider]  fluconazole (DIFLUCAN) 100 MG tablet Take by mouth. 08/24/20   [provider]  lactulose (CHRONULAC) 10 GM/15ML solution Take 10 g by mouth 2 (two) times daily as needed for mild constipation.    [provider]  loperamide (IMODIUM A-D) 2 MG tablet Take 2 tablets (4 mg total) by mouth 4 (four) times daily as needed for diarrhea or loose stools. 09/09/19   Carrie Mew, MD  ondansetron (ZOFRAN-ODT) 4 MG disintegrating tablet Take by mouth. 09/16/20   [provider]  pregabalin (LYRICA) 150 MG capsule Take by mouth daily as needed. 08/19/20   [provider]  Skin Protectants, Misc. (DIMETHICONE-ZINC OXIDE) cream  08/24/20   [provider]     Allergies Patient has no known allergies.  History reviewed. No pertinent family history.  Social History Social History   Tobacco Use   Smoking status: Never   Smokeless tobacco: Never  Substance Use Topics   Alcohol use: Never   Drug use: Never    Review of Systems  Constitutional: No fever/chills Eyes: No visual changes.  ENT: No sore throat. Cardiovascular: As above Respiratory: Denies shortness of breath. Gastrointestinal: No abdominal pain.  No nausea, no vomiting.    Genitourinary: Negative for dysuria. Musculoskeletal: Negative for back pain. Skin: Negative for rash. Neurological: Negative for headaches or weakness   ____________________________________________   PHYSICAL EXAM:  VITAL SIGNS: ED Triage Vitals  Enc Vitals Group     BP 09/20/20 1702 116/69     Pulse Rate 09/20/20 1702 81     Resp 09/20/20 1702 16     Temp 09/20/20 1702 (!) 97.5 F (36.4 C)     Temp Source 09/20/20 1702 Oral     SpO2 09/20/20 1702 99 %     Weight 09/20/20 1704 99.8 kg (220 lb)     Height --      Head Circumference --      Peak Flow --      Pain Score 09/20/20 1703 4     Pain Loc --      Pain Edu? --      Excl. in [place]? --     Constitutional: Alert and oriented.  Nose: No congestion/rhinnorhea. Mouth/Throat: Mucous membranes are moist.   Neck:  Painless ROM Cardiovascular: Normal rate, regular rhythm. Grossly normal heart sounds.  Good peripheral circulation. Respiratory: Normal respiratory effort.  No retractions. Lungs CTAB. Gastrointestinal: Soft and nontender. No distention.  No CVA tenderness.  Musculoskeletal: Left AKA, right BKA Neurologic:  Normal speech and language. No gross focal neurologic deficits are appreciated.  Skin:  Skin is warm, diaphoretic Psychiatric: Mood and affect are normal. Speech and behavior are normal.  ____________________________________________   LABS (all labs ordered are listed, but only abnormal results are displayed)  Labs Reviewed  BASIC METABOLIC PANEL - Abnormal; Notable for the following components:      Result Value   Sodium 134 (*)    Glucose, Bld 143 (*)    BUN 44 (*)    Creatinine, Ser 5.69 (*)    GFR, Estimated 10 (*)    All other components within normal limits  CBC - Abnormal; Notable for the following components:   RBC 3.07 (*)    Hemoglobin 9.2 (*)    HCT 29.6 (*)    RDW 16.9 (*)    Platelets 103 (*)    All other components within normal limits  D-DIMER, QUANTITATIVE - Abnormal;  Notable for the following components:   D-Dimer, Quant 0.60 (*)    All other components within normal limits  APTT - Abnormal; Notable for the following components:   aPTT 43 (*)    All other components within normal limits  HEPARIN LEVEL (UNFRACTIONATED) - Abnormal; Notable for the following components:   Heparin Unfractionated >1.10 (*)    All other components within normal limits  CBG MONITORING, ED - Abnormal; Notable for the following components:   Glucose-Capillary 145 (*)    All other components within normal limits  TROPONIN I (HIGH SENSITIVITY) - Abnormal; Notable for the following components:   Troponin I (High Sensitivity) 338 (*)    All other components within normal limits  BASIC METABOLIC PANEL  CBC  TSH  TROPONIN I (HIGH SENSITIVITY)   ____________________________________________  EKG  ED ECG REPORT I, Lavonia Drafts, the attending physician, personally viewed and interpreted this ECG.  Date: 09/20/2020  Rhythm: Atrial fibrillation QRS Axis: normal Intervals: Abnormal ST/T Wave abnormalities: Nonspecific change Narrative Interpretation: no evidence of acute ischemia  ____________________________________________  RADIOLOGY  Chest x-ray viewed by me, mild pulmonary edema ____________________________________________   PROCEDURES  Procedure(s) performed: No  Procedures   Critical Care performed: yes  CRITICAL CARE Performed by: Lavonia Drafts   Total critical care time: 72mnutes  Critical care time was exclusive of separately billable procedures and treating other patients.  Critical care was necessary to treat or prevent imminent or life-threatening deterioration.  Critical care was time spent personally by me on the following activities: development of treatment plan with patient and/or surrogate as well as nursing, discussions with consultants, evaluation of patient's response to treatment, examination of patient, obtaining history from  patient or surrogate, ordering and performing treatments and interventions, ordering and review of laboratory studies, ordering and review of radiographic studies, pulse oximetry and re-evaluation of patient's condition.  ____________________________________________   INITIAL IMPRESSION / ASSESSMENT AND PLAN / ED COURSE  Pertinent labs & imaging results that were available during my care of the patient were reviewed by me and considered in my medical decision making (see chart for details).   Patient presents with chest pain as detailed above, differential includes ACS, EKG however is nonischemic change from prior, given the rapid  onset and diaphoresis raises concern for possible dissection/PE, pending labs.  Patient has received 2 doses of nitroglycerin with little improvement, soft blood pressure, will treat with morphine  Chest x-ray with possible mild pulmonary edema  Notified of elevated troponin of 338.  On my read no evidence of dissection, pending radiology read  CTA negative per radiology for dissection, heparin GTT started, patient is now pain-free, discussed with the hospitalist for admission ____________________________________________   FINAL CLINICAL IMPRESSION(S) / ED DIAGNOSES  Final diagnoses:  NSTEMI (non-ST elevated myocardial infarction) [facility])        Note:  This document was prepared using Dragon voice recognition software and may include unintentional dictation errors.    Lavonia Drafts, MD 09/20/20 [phone]

## 2020-09-20 NOTE — ED Triage Notes (Signed)
Patient brought in via ems from [facility]. Patient started to have sudden onset of SOB and CP while watching TV. EMS gave 324 asa. Hypotensive on arrival with 215m bolus. VSS

## 2020-09-20 NOTE — H&P (Addendum)
History and Physical   Antonio Phillips [id number] DOB: 1946/02/10 DOA: 09/20/2020  PCP: Antonio Dolly, MD  [facility] Patient coming from: [facility]   I have personally briefly reviewed patient's old medical records in [place].  Chief Concern: Chest pain and shortness of breath  HPI: Antonio Phillips is a 75 y.o. male with medical history significant for hypertension, end-stage renal disease on hemodialysis, CAD, status post right below the knee amputation, status post left above-the-knee amputation, obesity, ventral hernia that is reducible, hyperlipidemia, presents to the emergency department from [facility] for chief concerns of chest pain and shortness of breath.  At bedside Antonio Phillips was able to tell me his full name, age, location of hospital, current calendar year 2022.  He denies chest pain and shortness of breath at my evaluation.  He reports that while watching TV he developed sudden onset of chest pain that he describes as a tight squeezing sensation.  He states the pain was in the right lower sternal border.  He states that the pain was persistent until he came to the emergency department and received IV morphine.  He states that on a scale of 0-10, he states his pain was a 1 out of 10.  He also states that he has had multiple amputations and multiple surgeries and therefore he has a high pain tolerance.  He also endorses nausea and denies vomiting.  He endorses shortness of breath that has also resolved since given morphine.  Social history: He lives at [facility] long-term rehab.  He is estranged with his children in [place].  He denies tobacco, EtOH, recreational drug use.  ROS: Constitutional: no weight change, no fever ENT/Mouth: no sore throat, no rhinorrhea Eyes: no eye pain, no vision changes Cardiovascular: no chest pain, no dyspnea,  no edema, no palpitations Respiratory: no cough, no sputum,  no wheezing Gastrointestinal: no nausea, no vomiting, no diarrhea, no constipation Genitourinary: no urinary incontinence, no dysuria, no hematuria Musculoskeletal: no arthralgias, no myalgias Skin: no skin lesions, no pruritus, Neuro: + weakness, no loss of consciousness, no syncope Psych: no anxiety, no depression, + decrease appetite Heme/Lymph: no bruising, no bleeding  ED Course: Discussed with emergency medicine provider, patient requiring hospitalization for NSTEMI.  Vitals in the emergency department was remarkable for temperature of 97.5, respiration rate of 13, heart rate 116, blood pressure 116/99, SPO2 of 99% on room air.  Labs in the emergency department was remarkable for high sensitive troponin is 338, D-dimer 0.60.  On initial presentation per EDP, patient was diaphoretic soaking the sheets and complaining of persistent chest pain.  WBC 5.2, hemoglobin 9.2, platelets 103, BUN 44, serum creatinine of 5.69, sodium 134, potassium 4.7, chloride 101, bicarb 23, Nonfasting blood glucose 143.  EDP started patient on heparin GTT, morphine 4 mg IV, Zofran 4 mg IV Assessment/Plan  Principal Problem:   Shortness of breath Active Problems:   ESRD on dialysis (HCC)   Pressure injury of skin   Generalized weakness   Atrial fibrillation, chronic (HCC)   Acquired thrombophilia (HCC)   NSTEMI (non-ST elevated myocardial infarction) ([facility])   # Shortness of breath # NSTEMI - Continue heparin GTT - Complete echo ordered - Nitroglycerin 1 inch every 6 hours as needed for chest pain - Morphine 4 mg IV every 4 hours as needed for severe pain, 4 doses ordered - Admit to progressive cardiac, observation, continuous telemetry for 24 hours ordered -  Initial troponin was 338, pending second troponin - A.m. team to consult cardiology  # End-stage renal disease-on hemodialysis Tuesday Thursday Saturday - A.m. team to consult nephrology   # Hypertension - Metoprolol tartrate injection  2.5 mg IV every 5 hours as needed for SBP greater than 180, 1 day ordered  # Atrial fibrillation - Eliquis 5 mg twice daily has not been resumed due to heparin gtt.  # Hyperlipidemia  # Anemia of chronic kidney disease-at baseline - Hemoglobin on admission is 9.2 - Range in the last year has been 7.8-10  # Restless leg syndrome-ropinirole 1 mg nightly and Lyrica 75 mg nightly resumed  # Ventral hernia-reproducible  As needed medications: Morphine 4 mg IV every 4 hours as needed for severe pain, nitroglycerin 1 inch every 6 hours as needed for chest pain, ondansetron, acetaminophen  Chart reviewed.   DVT prophylaxis: Heparin GTT Code Status: Full code Diet: Heart healthy, n.p.o. at midnight Family Communication: No Disposition Plan: Pending clinical course Consults called: None at this time Admission status: Progressive cardiac, observation, telemetry  Past Medical History:  Diagnosis Date   Atrial fibrillation ([place])    Diabetes ([place])    Pt states that he has no hx of diabetes   ESRD (end stage renal disease) ([place])    Past Surgical History:  Procedure Laterality Date   AV FISTULA REPAIR     BELOW KNEE LEG AMPUTATION Bilateral    CHOLECYSTECTOMY     TOTAL HIP ARTHROPLASTY Bilateral    Social History:  reports that he has never smoked. He has never used smokeless tobacco. He reports that he does not drink alcohol and does not use drugs.  No Known Allergies History reviewed. No pertinent family history. Family history: Family history reviewed and not pertinent  Prior to Admission medications   Medication Sig Start Date End Date Taking? Authorizing Provider  aspirin 81 MG EC tablet Take 1 tablet by mouth daily. 11/02/17  Yes [provider]  carvedilol (COREG) 3.125 MG tablet Take 1 tablet (3.125 mg total) by mouth 2 (two) times daily with a meal. 01/17/20  Yes Wieting, Richard, MD  diclofenac Sodium (VOLTAREN) 1 % GEL Apply 2 g topically See admin instructions.  Apply 2 g to right stump once every morning 09/08/20  Yes [provider]  H-CHLOR 12 0.125 % SOLN Apply 1 application topically See admin instructions. Cleanse left buttock (open boil) with Dakin's solution, dry, apply CA+ALG silver & foam dressing. Change daily and PRN. 07/30/20  Yes [provider]  HYDROcodone-acetaminophen (NORCO/VICODIN) 5-325 MG tablet Take 1 tablet by mouth every 6 (six) hours as needed for severe pain. 01/17/20  Yes Wieting, Richard, MD  loratadine (CLARITIN) 10 MG tablet Take 10 mg by mouth See admin instructions. Take 10 mg by mouth once daily on Mondays, Wednesdays, Fridays, and Sundays   Yes [provider]  pregabalin (LYRICA) 75 MG capsule Take 75 mg by mouth at bedtime.  11/02/17  Yes [provider]  acetaminophen (TYLENOL) 325 MG tablet Take 2 tablets (650 mg total) by mouth every 6 (six) hours as needed for mild pain or moderate pain. 01/17/20   Loletha Grayer, MD  ALLERGY RELIEF 10 MG tablet Take by mouth. 05/28/20   [provider]  apixaban (ELIQUIS) 5 MG TABS tablet Take 1 tablet (5 mg total) by mouth 2 (two) times daily. 01/17/20   Loletha Grayer, MD  atorvastatin (LIPITOR) 20 MG tablet Take 40 mg by mouth daily.  03/13/19  [provider]  atorvastatin (LIPITOR) 40 MG tablet Take 40 mg by mouth daily as needed. 09/17/20   [provider]  carboxymethylcellulose (REFRESH PLUS) 0.5 % SOLN Apply 1 drop to eye every 8 (eight) hours as needed. 02/19/18   [provider]  clotrimazole (LOTRIMIN) 1 % cream Apply to redness under foreskin on penis twice a day for 2 weeks 01/17/20   Loletha Grayer, MD  collagenase (SANTYL) ointment Apply topically daily. 01/17/20   Loletha Grayer, MD  doxycycline (VIBRAMYCIN) 100 MG capsule Take 100 mg by mouth 2 (two) times daily. 07/17/20   [provider]  ENULOSE 10 GM/15ML SOLN Take by mouth. 08/24/20   [provider]  finasteride (PROSCAR) 5  MG tablet Take 5 mg by mouth daily. Patient not taking: Reported on 01/13/2020 03/13/19   [provider]  fluconazole (DIFLUCAN) 100 MG tablet Take by mouth. 08/24/20   [provider]  lactulose (CHRONULAC) 10 GM/15ML solution Take 10 g by mouth 2 (two) times daily as needed for mild constipation.    [provider]  loperamide (IMODIUM A-D) 2 MG tablet Take 2 tablets (4 mg total) by mouth 4 (four) times daily as needed for diarrhea or loose stools. 09/09/19   Carrie Mew, MD  midodrine (PROAMATINE) 5 MG tablet Take 5 mg by mouth daily at 6 (six) AM.     [provider]  ondansetron (ZOFRAN-ODT) 4 MG disintegrating tablet Take by mouth. 09/16/20   [provider]  pregabalin (LYRICA) 150 MG capsule Take by mouth daily as needed. 08/19/20   [provider]  REFRESH PLUS 0.5 % SOLN Apply to eye. 07/28/20   [provider]  rOPINIRole (REQUIP) 1 MG tablet Take 1 mg by mouth at bedtime. 03/13/19   [provider]  sevelamer carbonate (RENVELA) 800 MG tablet Take 1,600 mg by mouth See admin instructions. Take 1,600 mg by mouth twice daily with breakfast and dinner on Tuesdays, Thursdays, and Saturdays    [provider]  Skin Protectants, Misc. (DIMETHICONE-ZINC OXIDE) cream  08/24/20   [provider]  vitamin B-12 (CYANOCOBALAMIN) 1000 MCG tablet Take 1,000 mcg by mouth daily.    [provider]   Physical Exam: Vitals:   09/20/20 1704 09/20/20 1730 09/20/20 1751 09/20/20 1830  BP:  114/72 111/69 101/66  Pulse:  88 60 71  Resp:  (!) '9 13 13  '$ Temp:      TempSrc:      SpO2:  99% 97% 99%  Weight: 99.8 kg      Constitutional: appears age-appropriate, NAD, calm, comfortable Eyes: PERRL, lids and conjunctivae normal ENMT: Mucous membranes are moist. Posterior pharynx clear of any exudate or lesions. Age-appropriate dentition. Hearing appropriate Neck: normal, supple, no masses, no  thyromegaly Respiratory: clear to auscultation bilaterally, no wheezing, no crackles. Normal respiratory effort. No accessory muscle use.  Cardiovascular: Regular rate and rhythm, no murmurs / rubs / gallops. No extremity edema. 2+ pedal pulses. No carotid bruits.  Left upper extremity fistula with good bruit Abdomen: Obese abdomen, ventral hernia, no tenderness, no masses palpated, no hepatosplenomegaly. Bowel sounds positive.  Musculoskeletal: no clubbing / cyanosis.  Right BKA, left AKA, bilateral stump appears well-healed.. Good ROM, no contractures, no atrophy. Normal muscle tone.  Skin: no rashes, lesions, ulcers. No induration Neurologic: Sensation intact. Strength 5/5 in all 4.  Psychiatric: Normal judgment and insight. Alert and oriented x 3. Normal mood.   EKG: independently reviewed, showing atrial fibrillation with rate  of 86, QTc 513  Chest x-ray on Admission: I personally reviewed and I agree with radiologist reading as below.  DG Chest 2 View  Result Date: 09/20/2020 CLINICAL DATA:  Chest pain and shortness of breath. EXAM: CHEST - 2 VIEW COMPARISON:  Radiograph 09/09/2019 FINDINGS: Chronic cardiomegaly. This is unchanged from prior exam. Unchanged mediastinal contours with aortic atherosclerosis. No confluent airspace disease. There is mild peribronchial thickening. Trace fluid in the fissures. No large subpulmonic effusion. No pneumothorax. No acute osseous abnormalities are seen IMPRESSION: Chronic cardiomegaly. Mild peribronchial thickening suspicious for pulmonary edema. Minimal fluid in the fissures. Electronically Signed   By: Keith Rake M.D.   On: 09/20/2020 17:37   CT ANGIO CHEST AORTA W/CM & OR WO/CM  Result Date: 09/20/2020 CLINICAL DATA:  Chest pain short of breath EXAM: CT ANGIOGRAPHY CHEST WITH CONTRAST TECHNIQUE: Multidetector CT imaging of the chest was performed using the standard protocol during bolus administration of intravenous contrast. Multiplanar CT  image reconstructions and MIPs were obtained to evaluate the vascular anatomy. CONTRAST:  28m OMNIPAQUE IOHEXOL 350 MG/ML SOLN COMPARISON:  Chest x-ray 09/20/2020 FINDINGS: Cardiovascular: Non contrasted images of the chest demonstrate no acute intramural hematoma. Moderate aortic atherosclerosis. Negative for aneurysm or aortic dissection. Coronary vascular calcifications. Cardiomegaly with small pericardial effusion. No gross central filling defects within the pulmonary arteries. Mediastinum/Nodes: Midline trachea. Status post right thyroidectomy. Low right paratracheal node measuring 11 mm. Esophagus within normal limits. Lungs/Pleura: Lungs are clear. No pleural effusion or pneumothorax. Upper Abdomen: Gallstones. Questionable mild gallbladder wall thickening or edema. Atrophic appearing left kidney with multiple cysts. Musculoskeletal: Osteophytosis of the spine with large beak like osteophyte at T10-T11. Review of the MIP images confirms the above findings. IMPRESSION: 1. Negative for acute aortic aneurysm or dissection. No gross acute central embolus seen. 2. Cardiomegaly with small pericardial effusion 3. Gallstones. Possible mild gallbladder wall thickening or edema, suggest correlation with ultrasound Aortic Atherosclerosis (ICD10-I70.0). Electronically Signed   By: KDonavan FoilM.D.   On: 09/20/2020 18:36    Labs on Admission: I have personally reviewed following labs  CBC: Recent Labs  Lab 09/20/20 1712  WBC 5.2  HGB 9.2*  HCT 29.6*  MCV 96.4  PLT [id number]   Basic Metabolic Panel: Recent Labs  Lab 09/20/20 1712  NA 134*  K 4.7  CL 101  CO2 23  GLUCOSE 143*  BUN 44*  CREATININE 5.69*  CALCIUM 8.9   GFR: CrCl cannot be calculated (Unknown ideal weight.).  CBG: Recent Labs  Lab 09/20/20 1727  GLUCAP 145*   Urine analysis:    Component Value Date/Time   COLORURINE YELLOW (A) 09/09/2019 1654   APPEARANCEUR HAZY (A) 09/09/2019 1654   APPEARANCEUR Hazy 11/14/2012 2044    LABSPEC 1.007 09/09/2019 1654   LABSPEC 1.016 11/14/2012 2044   PHURINE 9.0 (H) 09/09/2019 1654   GLUCOSEU >=500 (A) 09/09/2019 1654   GLUCOSEU 150 mg/dL 11/14/2012 2044   HGBUR NEGATIVE 09/09/2019 1654   BILIRUBINUR NEGATIVE 09/09/2019 1654   BILIRUBINUR Negative 11/14/2012 2044   KETONESUR NEGATIVE 09/09/2019 1654   PROTEINUR 100 (A) 09/09/2019 1654   NITRITE NEGATIVE 09/09/2019 1654   LEUKOCYTESUR NEGATIVE 09/09/2019 1654   LEUKOCYTESUR Trace 11/14/2012 2044   Dr. CTobie PoetTriad Hospitalists  If 7PM-7AM, please contact overnight-coverage provider If 7AM-7PM, please contact day coverage provider [email or web address]  09/20/2020, 7:24 PM

## 2020-09-20 NOTE — Consult Note (Signed)
ANTICOAGULATION CONSULT NOTE - Initial Consult  Pharmacy Consult for heparin infusion Indication: chest pain/ACS  No Known Allergies  Patient Measurements: Weight: 99.8 kg (220 lb) Heparin Dosing Weight: 95.8 kg   Vital Signs: Temp: 97.5 F (36.4 C) (07/17 1702) Temp Source: Oral (07/17 1702) BP: 101/66 (07/17 1830) Pulse Rate: 71 (07/17 1830)  Labs: Recent Labs    09/20/20 1712  HGB 9.2*  HCT 29.6*  PLT 103*  CREATININE 5.69*  TROPONINIHS 338*    CrCl cannot be calculated (Unknown ideal weight.).   Medical History: Past Medical History:  Diagnosis Date   Atrial fibrillation ([place])    Diabetes ([place])    Pt states that he has no hx of diabetes   ESRD (end stage renal disease) (HCC)     Medications:  Apixaban 5 mg BID (Afib) PTA. Last dose 7/17 @ 1000  Assessment: 75 y/o male with hx of Afib on Eliquis PTA presented with SOB/chest pain. Troponin 338. Pharmacy has been consulted for initiation and management of heparin infusion for ACS/Chest pain.   Baseline: HL > 1.10, aPTT 43  Goal of Therapy:  Heparin level 0.3-0.7 units/ml aPTT 66-102 seconds Monitor platelets by anticoagulation protocol: Yes   Plan:  Start heparin infusion at 1350 units/hr w/o bolus due to apixaban PTA with last dose < 12 hours.  Check aPTT in 8 hours and HL daily while on heparin. Transition to HL once aPTT and HL begin to correlate Continue to monitor H&H and platelets  Dorothe Pea, PharmD, BCPS Clinical Pharmacist   09/20/2020,7:03 PM

## 2020-09-21 ENCOUNTER — Encounter: Payer: Self-pay | Admitting: Internal Medicine

## 2020-09-21 ENCOUNTER — Observation Stay (HOSPITAL_COMMUNITY)
Admit: 2020-09-21 | Discharge: 2020-09-21 | Disposition: A | Discharge status: Home / Self Care | Payer: No Typology Code available for payment source | Attending: Internal Medicine | Admitting: Internal Medicine

## 2020-09-21 DIAGNOSIS — E785 Hyperlipidemia, unspecified: Secondary | ICD-10-CM | POA: Diagnosis present

## 2020-09-21 DIAGNOSIS — Z992 Dependence on renal dialysis: Secondary | ICD-10-CM | POA: Diagnosis not present

## 2020-09-21 DIAGNOSIS — I739 Peripheral vascular disease, unspecified: Secondary | ICD-10-CM | POA: Diagnosis not present

## 2020-09-21 DIAGNOSIS — Z7901 Long term (current) use of anticoagulants: Secondary | ICD-10-CM | POA: Diagnosis not present

## 2020-09-21 DIAGNOSIS — E1151 Type 2 diabetes mellitus with diabetic peripheral angiopathy without gangrene: Secondary | ICD-10-CM | POA: Diagnosis present

## 2020-09-21 DIAGNOSIS — I214 Non-ST elevation (NSTEMI) myocardial infarction: Secondary | ICD-10-CM | POA: Diagnosis not present

## 2020-09-21 DIAGNOSIS — Z89511 Acquired absence of right leg below knee: Secondary | ICD-10-CM | POA: Diagnosis not present

## 2020-09-21 DIAGNOSIS — D6959 Other secondary thrombocytopenia: Secondary | ICD-10-CM | POA: Diagnosis present

## 2020-09-21 DIAGNOSIS — R778 Other specified abnormalities of plasma proteins: Secondary | ICD-10-CM | POA: Diagnosis not present

## 2020-09-21 DIAGNOSIS — R079 Chest pain, unspecified: Secondary | ICD-10-CM | POA: Diagnosis not present

## 2020-09-21 DIAGNOSIS — I959 Hypotension, unspecified: Secondary | ICD-10-CM | POA: Diagnosis not present

## 2020-09-21 DIAGNOSIS — N2581 Secondary hyperparathyroidism of renal origin: Secondary | ICD-10-CM | POA: Diagnosis present

## 2020-09-21 DIAGNOSIS — Z96643 Presence of artificial hip joint, bilateral: Secondary | ICD-10-CM | POA: Diagnosis present

## 2020-09-21 DIAGNOSIS — R0602 Shortness of breath: Secondary | ICD-10-CM

## 2020-09-21 DIAGNOSIS — I120 Hypertensive chronic kidney disease with stage 5 chronic kidney disease or end stage renal disease: Secondary | ICD-10-CM | POA: Diagnosis present

## 2020-09-21 DIAGNOSIS — N186 End stage renal disease: Secondary | ICD-10-CM | POA: Diagnosis present

## 2020-09-21 DIAGNOSIS — K439 Ventral hernia without obstruction or gangrene: Secondary | ICD-10-CM | POA: Diagnosis present

## 2020-09-21 DIAGNOSIS — D631 Anemia in chronic kidney disease: Secondary | ICD-10-CM | POA: Diagnosis present

## 2020-09-21 DIAGNOSIS — G2581 Restless legs syndrome: Secondary | ICD-10-CM | POA: Diagnosis present

## 2020-09-21 DIAGNOSIS — Z89612 Acquired absence of left leg above knee: Secondary | ICD-10-CM | POA: Diagnosis not present

## 2020-09-21 DIAGNOSIS — E1122 Type 2 diabetes mellitus with diabetic chronic kidney disease: Secondary | ICD-10-CM | POA: Diagnosis present

## 2020-09-21 DIAGNOSIS — I2510 Atherosclerotic heart disease of native coronary artery without angina pectoris: Secondary | ICD-10-CM | POA: Diagnosis present

## 2020-09-21 DIAGNOSIS — I4821 Permanent atrial fibrillation: Secondary | ICD-10-CM | POA: Diagnosis present

## 2020-09-21 DIAGNOSIS — Z7982 Long term (current) use of aspirin: Secondary | ICD-10-CM | POA: Diagnosis not present

## 2020-09-21 DIAGNOSIS — D638 Anemia in other chronic diseases classified elsewhere: Secondary | ICD-10-CM | POA: Diagnosis not present

## 2020-09-21 DIAGNOSIS — R0609 Other forms of dyspnea: Secondary | ICD-10-CM | POA: Diagnosis not present

## 2020-09-21 DIAGNOSIS — D6859 Other primary thrombophilia: Secondary | ICD-10-CM | POA: Diagnosis present

## 2020-09-21 DIAGNOSIS — M2550 Pain in unspecified joint: Secondary | ICD-10-CM | POA: Diagnosis not present

## 2020-09-21 DIAGNOSIS — I482 Chronic atrial fibrillation, unspecified: Secondary | ICD-10-CM | POA: Diagnosis not present

## 2020-09-21 DIAGNOSIS — Z79899 Other long term (current) drug therapy: Secondary | ICD-10-CM | POA: Diagnosis not present

## 2020-09-21 DIAGNOSIS — Z7401 Bed confinement status: Secondary | ICD-10-CM | POA: Diagnosis not present

## 2020-09-21 DIAGNOSIS — Z20822 Contact with and (suspected) exposure to covid-19: Secondary | ICD-10-CM | POA: Diagnosis present

## 2020-09-21 LAB — CBC
HCT: 29 % — ABNORMAL LOW (ref 39.0–52.0)
Hemoglobin: 8.9 g/dL — ABNORMAL LOW (ref 13.0–17.0)
MCH: 29.5 pg (ref 26.0–34.0)
MCHC: 30.7 g/dL (ref 30.0–36.0)
MCV: 96 fL (ref 80.0–100.0)
Platelets: 98 10*3/uL — ABNORMAL LOW (ref 150–400)
RBC: 3.02 MIL/uL — ABNORMAL LOW (ref 4.22–5.81)
RDW: 16.8 % — ABNORMAL HIGH (ref 11.5–15.5)
WBC: 4.6 10*3/uL (ref 4.0–10.5)
nRBC: 0 % (ref 0.0–0.2)

## 2020-09-21 LAB — BASIC METABOLIC PANEL
Anion gap: 10 (ref 5–15)
BUN: 50 mg/dL — ABNORMAL HIGH (ref 8–23)
CO2: 24 mmol/L (ref 22–32)
Calcium: 8.8 mg/dL — ABNORMAL LOW (ref 8.9–10.3)
Chloride: 101 mmol/L (ref 98–111)
Creatinine, Ser: 6.45 mg/dL — ABNORMAL HIGH (ref 0.61–1.24)
GFR, Estimated: 8 mL/min — ABNORMAL LOW (ref >60)
Glucose, Bld: 111 mg/dL — ABNORMAL HIGH (ref 70–99)
Potassium: 4.6 mmol/L (ref 3.5–5.1)
Sodium: 135 mmol/L (ref 135–145)

## 2020-09-21 LAB — SARS CORONAVIRUS 2 (TAT 6-24 HRS): SARS Coronavirus 2: NEGATIVE

## 2020-09-21 LAB — APTT
aPTT: >160 seconds (ref 24–36)
aPTT: 96 seconds — ABNORMAL HIGH (ref 24–36)
aPTT: 71 seconds — ABNORMAL HIGH (ref 24–36)

## 2020-09-21 LAB — ECHOCARDIOGRAM COMPLETE
AR max vel: 2.23 cm2
AV Area VTI: 2.73 cm2
AV Area mean vel: 2.37 cm2
AV Mean grad: 1 mmHg
AV Peak grad: 1.9 mmHg
Ao pk vel: 0.69 m/s
Area-P 1/2: 6.27 cm2
Height: 71 in
S' Lateral: 2.53 cm
Weight: 3520 oz

## 2020-09-21 LAB — MRSA NEXT GEN BY PCR, NASAL: MRSA by PCR Next Gen: DETECTED — AB

## 2020-09-21 MED ORDER — SODIUM CHLORIDE 0.9 % IV SOLN: INTRAVENOUS | Status: DC

## 2020-09-21 MED ORDER — SODIUM CHLORIDE 0.9 % IV SOLN: 100.0000 mL | INTRAVENOUS | Status: DC | PRN

## 2020-09-21 MED ORDER — LIDOCAINE-PRILOCAINE 2.5-2.5 % EX CREA
1.0000 "application " | TOPICAL_CREAM | CUTANEOUS | Status: DC | PRN
  Filled 2020-09-21: qty 5

## 2020-09-21 MED ORDER — PENTAFLUOROPROP-TETRAFLUOROETH EX AERO
1.0000 "application " | INHALATION_SPRAY | CUTANEOUS | Status: DC | PRN
  Filled 2020-09-21: qty 30

## 2020-09-21 MED ORDER — SODIUM CHLORIDE 0.9 % IV SOLN: 250.0000 mL | INTRAVENOUS | Status: DC | PRN

## 2020-09-21 MED ORDER — SODIUM CHLORIDE 0.9% FLUSH
3.0000 mL | Freq: Two times a day (BID) | INTRAVENOUS | Status: DC
  Administered 2020-09-21 – 2020-09-23 (×3): 3 mL via INTRAVENOUS

## 2020-09-21 MED ORDER — HEPARIN SODIUM (PORCINE) 1000 UNIT/ML DIALYSIS
1000.0000 [IU] | INTRAMUSCULAR | Status: DC | PRN
  Filled 2020-09-21: qty 1

## 2020-09-21 MED ORDER — HYDROCODONE-ACETAMINOPHEN 5-325 MG PO TABS
1.0000 | ORAL_TABLET | Freq: Four times a day (QID) | ORAL | Status: DC | PRN
  Administered 2020-09-21 – 2020-09-22 (×4): 1 via ORAL
  Filled 2020-09-21 (×4): qty 1

## 2020-09-21 MED ORDER — ASPIRIN 81 MG PO CHEW
81.0000 mg | CHEWABLE_TABLET | ORAL | Status: AC
  Administered 2020-09-22: 81 mg via ORAL
  Filled 2020-09-21: qty 1

## 2020-09-21 MED ORDER — ALTEPLASE 2 MG IJ SOLR: 2.0000 mg | Freq: Once | INTRAMUSCULAR | Status: DC | PRN

## 2020-09-21 MED ORDER — MUPIROCIN 2 % EX OINT
1.0000 "application " | TOPICAL_OINTMENT | Freq: Two times a day (BID) | CUTANEOUS | Status: DC
  Administered 2020-09-21 – 2020-09-23 (×4): 1 via NASAL
  Filled 2020-09-21 (×2): qty 22

## 2020-09-21 MED ORDER — CHLORHEXIDINE GLUCONATE CLOTH 2 % EX PADS
6.0000 | MEDICATED_PAD | Freq: Every day | CUTANEOUS | Status: DC
  Administered 2020-09-22 – 2020-09-23 (×2): 6 via TOPICAL

## 2020-09-21 MED ORDER — HEPARIN (PORCINE) 25000 UT/250ML-% IV SOLN
1000.0000 [IU]/h | INTRAVENOUS | Status: DC
  Administered 2020-09-21 (×2): 1000 [IU]/h via INTRAVENOUS
  Filled 2020-09-21: qty 250

## 2020-09-21 MED ORDER — ASPIRIN 81 MG PO CHEW
81.0000 mg | CHEWABLE_TABLET | Freq: Every day | ORAL | Status: DC
  Administered 2020-09-21 – 2020-09-23 (×3): 81 mg via ORAL
  Filled 2020-09-21 (×3): qty 1

## 2020-09-21 MED ORDER — LIDOCAINE HCL (PF) 1 % IJ SOLN
5.0000 mL | INTRAMUSCULAR | Status: DC | PRN
  Filled 2020-09-21: qty 5

## 2020-09-21 MED ORDER — SODIUM CHLORIDE 0.9% FLUSH: 3.0000 mL | INTRAVENOUS | Status: DC | PRN

## 2020-09-21 NOTE — Progress Notes (Signed)
*  PRELIMINARY RESULTS* Echocardiogram 2D Echocardiogram has been performed.  Sherrie Sport 09/21/2020, 9:16 AM

## 2020-09-21 NOTE — Consult Note (Signed)
ANTICOAGULATION CONSULT NOTE  Pharmacy Consult for heparin infusion Indication: chest pain/ACS  Patient Measurements: Heparin Dosing Weight: 95.8 kg   Labs: Recent Labs    09/20/20 1712 09/20/20 1900 09/21/20 0503 09/21/20 0505 09/21/20 0633 09/21/20 1703  HGB 9.2*  --   --  8.9*  --   --   HCT 29.6*  --   --  29.0*  --   --   PLT 103*  --   --  98*  --   --   APTT 43*  --  >160*  --  96* 71*  HEPARINUNFRC >1.10*  --   --   --   --   --   CREATININE 5.69*  --   --  6.45*  --   --   TROPONINIHS 338* 296*  --   --   --   --      Estimated Creatinine Clearance: 11.9 mL/min (A) (by C-G formula based on SCr of 6.45 mg/dL (H)).   Medical History: Past Medical History:  Diagnosis Date   Demand ischemia ([place])    a. 11/2017 elev Trop in setting of AFib/SVT-->Echo [facility]): EF>55%. Mild LVH. Nl RV fxn.   Diabetes ([place])    a. Pt states that he has no hx of diabetes--A1c 5.5 11/2018.   ESRD (end stage renal disease) ([place])    a. On HD since 2019.   Hyperlipidemia    Hypotension    a. On midodrine.   Morbid obesity ([place])    PAD (peripheral artery disease) ([place])    a. s/p L AKA and R BKA.   Permanent atrial fibrillation ([place])    a. Dx 2019. CHA2DS2VASc = 3-4-->Eliquis.    Medications:  Apixaban 5 mg BID (Afib) PTA. Last dose 7/17 @ 1000  Assessment: 75 y/o male with hx of Afib on Eliquis PTA presented with SOB/chest pain. Troponin 338. Pharmacy has been consulted for initiation and management of heparin infusion for ACS/Chest pain.   Baseline: HL > 1.10, aPTT 43  Goal of Therapy:  Heparin level 0.3-0.7 units/ml aPTT 66-102 seconds Monitor platelets by anticoagulation protocol: Yes  07/18 0503 aPTT > 160  0718 0633 aPTT 96 (re-check, drip paused) decreased to 1000 units/hr 07/18 1703 aPTT 71    Plan:  --aPTT therapeutic x 1  ---Continue heparin infusion at 1000 units/hr  --Re-check aPTT and HL in 8 hours to confirm  --Daily CBC per protocol while on IV  heparin  Dorothe Pea, PharmD, BCPS Clinical Pharmacist   09/21/2020 5:50 PM

## 2020-09-21 NOTE — Progress Notes (Signed)
[facility]  ROUNDING NOTE   Subjective:   Antonio Phillips is a 75 y.o. male with a past medical history of hypertension, CAD, rt BKA, and ESRD on dialysis. He presented to the ED from his SNF for chest pain and shortness of breath.   Patient currently receives dialysis at [facility], supervised by Dr. Candiss Norse. Patient states he began having chest pain on Sunday afternoon. Did receive a full dialysis treatment on Saturday. Denies current chest pain. States he is scheduled for cardiac cath tomorrow.    Objective:  Vital signs in last 24 hours:  Temp:  [97.5 F (36.4 C)-98.2 F (36.8 C)] 97.7 F (36.5 C) (07/18 1127) Pulse Rate:  [54-101] 94 (07/18 1127) Resp:  [9-20] 17 (07/18 1127) BP: (94-126)/(60-80) 105/72 (07/18 1127) SpO2:  [91 %-100 %] 98 % (07/18 1127) Weight:  [99.8 kg] 99.8 kg (07/17 1704)  Weight change:  Filed Weights   09/20/20 1704  Weight: 99.8 kg    Intake/Output: No intake/output data recorded.   Intake/Output this shift:  No intake/output data recorded.  Physical Exam: General: NAD, resting in bed  Head: Normocephalic, atraumatic. Moist oral mucosal membranes  Eyes: Anicteric  Lungs:  Clear to auscultation, normal effort  Heart: Regular rate and rhythm  Abdomen:  Soft, nontender  Extremities:  trace peripheral edema.  Neurologic: Nonfocal, moving all four extremities  Skin: No lesions  Access: Lt AVF    Basic Metabolic Panel: Recent Labs  Lab 09/20/20 1712 09/21/20 0505  NA 134* 135  K 4.7 4.6  CL 101 101  CO2 23 24  GLUCOSE 143* 111*  BUN 44* 50*  CREATININE 5.69* 6.45*  CALCIUM 8.9 8.8*    Liver Function Tests: No results for input(s): AST, ALT, ALKPHOS, BILITOT, PROT, ALBUMIN in the last 168 hours. No results for input(s): LIPASE, AMYLASE in the last 168 hours. No results for input(s): AMMONIA in the last 168 hours.  CBC: Recent Labs  Lab 09/20/20 1712 09/21/20 0505  WBC 5.2 4.6  HGB 9.2* 8.9*  HCT  29.6* 29.0*  MCV 96.4 96.0  PLT 103* 98*    Cardiac Enzymes: No results for input(s): CKTOTAL, CKMB, CKMBINDEX, TROPONINI in the last 168 hours.  BNP: Invalid input(s): POCBNP  CBG: Recent Labs  Lab 09/20/20 1727  GLUCAP 145*    Microbiology: Results for orders placed or performed during the hospital encounter of 09/20/20  SARS CORONAVIRUS 2 (TAT 6-24 HRS) Nasopharyngeal Nasopharyngeal Swab     Status: None   Collection Time: 09/20/20  9:27 PM   Specimen: Nasopharyngeal Swab  Result Value Ref Range Status   SARS Coronavirus 2 NEGATIVE NEGATIVE Final    Comment: (NOTE) SARS-CoV-2 target nucleic acids are NOT DETECTED.  The SARS-CoV-2 RNA is generally detectable in upper and lower respiratory specimens during the acute phase of infection. Negative results do not preclude SARS-CoV-2 infection, do not rule out co-infections with other pathogens, and should not be used as the sole basis for treatment or other patient management decisions. Negative results must be combined with clinical observations, patient history, and epidemiological information. The expected result is Negative.  Fact Sheet for Patients: SugarRoll.be  Fact Sheet for Healthcare Providers: https://www.woods-mathews.com/  This test is not yet approved or cleared by the [place] FDA and  has been authorized for detection and/or diagnosis of SARS-CoV-2 by FDA under an Emergency Use Authorization (EUA). This EUA will remain  in effect (meaning this test can be used) for the duration of  the COVID-19 declaration under Se ction 564(b)(1) of the Act, 21 U.S.C. section 360bbb-3(b)(1), unless the authorization is terminated or revoked sooner.  Performed at [facility], [place] [address], [place], [place] [address]     Coagulation Studies: No results for input(s): LABPROT, INR in the last 72 hours.  Urinalysis: No results for input(s): COLORURINE, LABSPEC,  PHURINE, GLUCOSEU, HGBUR, BILIRUBINUR, KETONESUR, PROTEINUR, UROBILINOGEN, NITRITE, LEUKOCYTESUR in the last 72 hours.  Invalid input(s): APPERANCEUR    Imaging: DG Chest 2 View  Result Date: 09/20/2020 CLINICAL DATA:  Chest pain and shortness of breath. EXAM: CHEST - 2 VIEW COMPARISON:  Radiograph 09/09/2019 FINDINGS: Chronic cardiomegaly. This is unchanged from prior exam. Unchanged mediastinal contours with aortic atherosclerosis. No confluent airspace disease. There is mild peribronchial thickening. Trace fluid in the fissures. No large subpulmonic effusion. No pneumothorax. No acute osseous abnormalities are seen IMPRESSION: Chronic cardiomegaly. Mild peribronchial thickening suspicious for pulmonary edema. Minimal fluid in the fissures. Electronically Signed   By: Keith Rake M.D.   On: 09/20/2020 17:37   CT ANGIO CHEST AORTA W/CM & OR WO/CM  Result Date: 09/20/2020 CLINICAL DATA:  Chest pain short of breath EXAM: CT ANGIOGRAPHY CHEST WITH CONTRAST TECHNIQUE: Multidetector CT imaging of the chest was performed using the standard protocol during bolus administration of intravenous contrast. Multiplanar CT image reconstructions and MIPs were obtained to evaluate the vascular anatomy. CONTRAST:  64m OMNIPAQUE IOHEXOL 350 MG/ML SOLN COMPARISON:  Chest x-ray 09/20/2020 FINDINGS: Cardiovascular: Non contrasted images of the chest demonstrate no acute intramural hematoma. Moderate aortic atherosclerosis. Negative for aneurysm or aortic dissection. Coronary vascular calcifications. Cardiomegaly with small pericardial effusion. No gross central filling defects within the pulmonary arteries. Mediastinum/Nodes: Midline trachea. Status post right thyroidectomy. Low right paratracheal node measuring 11 mm. Esophagus within normal limits. Lungs/Pleura: Lungs are clear. No pleural effusion or pneumothorax. Upper Abdomen: Gallstones. Questionable mild gallbladder wall thickening or edema. Atrophic appearing  left kidney with multiple cysts. Musculoskeletal: Osteophytosis of the spine with large beak like osteophyte at T10-T11. Review of the MIP images confirms the above findings. IMPRESSION: 1. Negative for acute aortic aneurysm or dissection. No gross acute central embolus seen. 2. Cardiomegaly with small pericardial effusion 3. Gallstones. Possible mild gallbladder wall thickening or edema, suggest correlation with ultrasound Aortic Atherosclerosis (ICD10-I70.0). Electronically Signed   By: KDonavan FoilM.D.   On: 09/20/2020 18:36     Medications:    heparin 1,000 Units/hr (09/21/20 0829)    aspirin  81 mg Oral Daily   atorvastatin  40 mg Oral QHS   carvedilol  3.125 mg Oral BID WC   [START ON 09/22/2020] midodrine  10 mg Oral Q T,Th,Sa-HD   midodrine  5 mg Oral Daily   pregabalin  75 mg Oral QHS   rOPINIRole  1 mg Oral QHS   sevelamer carbonate  1,600 mg Oral BID WC   sevelamer carbonate  800 mg Oral Once per day on Sun Mon Wed Fri   sodium chloride flush  3 mL Intravenous Q12H   acetaminophen **OR** acetaminophen, HYDROcodone-acetaminophen, metoprolol tartrate, morphine injection, nitroGLYCERIN, ondansetron **OR** ondansetron (ZOFRAN) IV  Assessment/ Plan:  Mr. WNUNCIO RAHILLis a 75y.o.  male with a past medical history of hypertension, CAD, rt BKA, and ESRD on dialysis. He presented to the ED from his SNF for chest pain and shortness of breath.   [facility]/TTS/Lt AVF  Anemia of chronic kidney disease   Lab Results  Component Value Date  HGB 8.9 (L) 09/21/2020   Hgb below target Will monitor   2. End stage renal disease on dialysis Will maintain outpatient schedule during this admission Will dialyze tomorrow after cardiac cath  3. Secondary Hyperparathyroidism:   Lab Results  Component Value Date   CALCIUM 8.8 (L) 09/21/2020   PHOS 7.7 (H) 01/14/2020  Phosphorus and calcium not at goal Renvela with meals      LOS: 0 Brookes Craine 7/18/20222:05 PM

## 2020-09-21 NOTE — Consult Note (Addendum)
Antonio Phillips for heparin infusion Indication: chest pain/ACS  Patient Measurements: Heparin Dosing Weight: 95.8 kg   Labs: Recent Labs    09/20/20 1712 09/20/20 1900 09/21/20 0503 09/21/20 0505 09/21/20 0633  HGB 9.2*  --   --  8.9*  --   HCT 29.6*  --   --  29.0*  --   PLT 103*  --   --  98*  --   APTT 43*  --  >160*  --  96*  HEPARINUNFRC >1.10*  --   --   --   --   CREATININE 5.69*  --   --  6.45*  --   TROPONINIHS 338* 296*  --   --   --      Estimated Creatinine Clearance: 11.9 mL/min (A) (by C-G formula based on SCr of 6.45 mg/dL (H)).   Medical History: Past Medical History:  Diagnosis Date   Atrial fibrillation ([place])    Diabetes ([place])    Pt states that he has no hx of diabetes   ESRD (end stage renal disease) ([facility])     Medications:  Apixaban 5 mg BID (Afib) PTA. Last dose 7/17 @ 1000  Assessment: 75 y/o male with hx of Afib on Eliquis PTA presented with SOB/chest pain. Troponin 338. Pharmacy has been consulted for initiation and management of heparin infusion for ACS/Chest pain.   Baseline: HL > 1.10, aPTT 43  Goal of Therapy:  Heparin level 0.3-0.7 units/ml aPTT 66-102 seconds Monitor platelets by anticoagulation protocol: Yes  07/18 0503 aPTT > 160  0718 0633 aPTT 96 (re-check, drip paused)   Plan:  --aPTT supratherapeutic this AM. Drip was paused and re-check resulted 96. Earlier level this AM may have been erroneous but difficult to interpret re-check as un-known how long drip was paused for --Will go ahead and decrease heparin infusion to 1000 units/hr for now --Re-check aPTT in 8 hours after re-start --Daily CBC per protocol while on IV heparin  Antonio Phillips  09/21/2020 7:42 AM

## 2020-09-21 NOTE — H&P (View-Only) (Signed)
Cardiology Consult    Patient ID: SHYLOH LOURO MRN: [id number], DOB/AGE: 1945/04/23   Admit date: 09/20/2020 Date of Consult: 09/21/2020  Primary Physician: Marsh Dolly, MD Primary Cardiologist: New - T. Rockey Situ, MD  Requesting Provider: Lenise Herald, MD  Patient Profile    Antonio Phillips is a 75 y.o. male with a history of PAD, permanent Afib on eliquis, end-stage renal disease on hemodialysis, hypotension, morbid obesity, reported history of diabetes, and demand ischemia, who is being seen today for the evaluation of chest pain and non-STEMI at the request of Dr. Jimmye Norman.  Past Medical History   Past Medical History:  Diagnosis Date   Demand ischemia ([place])    a. 11/2017 elev Trop in setting of AFib/SVT-->[facility]): EF>55%. Mild LVH. Nl RV fxn.   Diabetes ([place])    a. Pt states that he has no hx of diabetes--A1c 5.5 11/2018.   ESRD (end stage renal disease) ([place])    a. On HD since 2019.   Hyperlipidemia    Hypotension    a. On midodrine.   Morbid obesity ([place])    PAD (peripheral artery disease) ([place])    a. s/p L AKA and R BKA.   Permanent atrial fibrillation ([place])    a. Dx 2019. CHA2DS2VASc = 3-4-->Eliquis.    Past Surgical History:  Procedure Laterality Date   AV FISTULA REPAIR     BELOW KNEE LEG AMPUTATION Bilateral    CHOLECYSTECTOMY     TOTAL HIP ARTHROPLASTY Bilateral      Allergies  No Known Allergies  History of Present Illness    74 year old male with above past medical history including peripheral arterial disease, permanent atrial fibrillation on Eliquis, end-stage renal disease on hemodialysis, hypotension, morbid obesity, reported history of diabetes, and demand ischemia.  He had progressive renal failure and subsequently began dialysis in 2019.  In September 2019, he was admitted to [facility] with chest pain and rapid A. fib/SVT.  Troponin was mildly elevated.  Echocardiogram showed an EF greater than 55%.  He was seen by cardiology, and it was felt  that troponin elevation was secondary to demand ischemia with recommendation for beta-blocker and oral anticoagulation.  Patient does not have outpatient cardiology follow-up.  He lives locally at a nursing facility in the setting of prior left AKA and right BKA, his mobility is quite limited.  He notes that over the past several years, he has been experiencing intermittent predominantly right-sided, dull chest discomfort sometimes associated with dyspnea, lasting anywhere from 15 minutes to an hour, and resolving spontaneously.  Over the past 6 months, symptoms have progressed and have now been occurring a few times a week.  He notes that he has had similar symptoms during dialysis in the past, for which he gets relief with sublingual nitroglycerin.  He was in his usual state of health until late last week, when he began experiencing nausea, vomiting, and diarrhea.  This occurred on 3 straight days but resolved by Saturday, July 16.  He underwent hemodialysis on the 16th without difficulty.  On July 17, while watching TV, he had sudden onset of right-sided chest discomfort, similar to prior episodes.  Symptoms persisted longer than usual, he notified staff at [facility] where he lives, and EMS was called.  In route, he was given 324 mg of aspirin and a 250 mL bolus in the setting of hypotension.  In the ED he received forcibly nitroglycerin without relief followed by intravenous morphine with complete relief.  ECG  showed A. fib without acute ST or T changes.  CTA of the chest was negative for PE or dissection.  Coronary calcium was noted.  Gallstones and possible mild gallbladder wall thickening or edema were noted.  High-sensitivity troponin was elevated at 338 with a follow-up value of 296.  Patient was placed on heparin and admitted.  He has not had any recurrent chest pain.  Inpatient Medications     atorvastatin  40 mg Oral QHS   carvedilol  3.125 mg Oral BID WC   [START ON 09/22/2020] midodrine   10 mg Oral Q T,Th,Sa-HD   midodrine  5 mg Oral Daily   pregabalin  75 mg Oral QHS   rOPINIRole  1 mg Oral QHS   sevelamer carbonate  1,600 mg Oral BID WC   sevelamer carbonate  800 mg Oral Once per day on Sun Mon Wed Fri    Family History    History reviewed. No pertinent family history. He indicated that his mother is deceased. He indicated that his father is deceased.  He denies any family history of premature coronary artery disease.  Social History    Social History   Socioeconomic History   Marital status: Single    Spouse name: Not on file   Number of children: Not on file   Years of education: Not on file   Highest education level: Not on file  Occupational History   Not on file  Tobacco Use   Smoking status: Never   Smokeless tobacco: Never  Substance and Sexual Activity   Alcohol use: Never   Drug use: Never   Sexual activity: Not on file  Other Topics Concern   Not on file  Social History Narrative   Lives @ local nsg facility.  Does not routinely exercise.   Social Determinants of Health   Financial Resource Strain: Not on file  Food Insecurity: Not on file  Transportation Needs: Not on file  Physical Activity: Not on file  Stress: Not on file  Social Connections: Not on file  Intimate Partner Violence: Not on file     Review of Systems    General:  No chills, fever, night sweats or weight changes.  Cardiovascular:  +++ chest pain, +++ dyspnea on exertion, no edema, orthopnea, palpitations, paroxysmal nocturnal dyspnea. Dermatological: No rash, lesions/masses Respiratory: No cough, +++ dyspnea Urologic: No hematuria, dysuria Abdominal:   +++ nausea, +++ vomiting, +++ diarrhea, no bright red blood per rectum, melena, or hematemesis Neurologic:  No visual changes, wkns, changes in mental status. All other systems reviewed and are otherwise negative except as noted above.  Physical Exam    Blood pressure 105/72, pulse 94, temperature 97.7 F (36.5  C), temperature source Oral, resp. rate 17, height '5\' 11"'$  (1.803 m), weight 99.8 kg, SpO2 98 %.  General: Pleasant, NAD Psych: Flat affect. Neuro: Alert and oriented X 3. Moves all extremities spontaneously. HEENT: Normal  Neck: Supple without bruits.  JVP difficult to gauge secondary to body habitus. Lungs:  Resp regular and unlabored, CTA. Heart: Irregularly irregular, no s3, s4, or murmurs. Abdomen: Obese, soft, non-tender, non-distended, BS + x 4.  Extremities: No clubbing, cyanosis.  Left AKA, right BKA. Radials 2+ and equal bilaterally.  Labs    Cardiac Enzymes Recent Labs  Lab 09/20/20 1712 09/20/20 1900  TROPONINIHS 338* 296*      Lab Results  Component Value Date   WBC 4.6 09/21/2020   HGB 8.9 (L) 09/21/2020   HCT 29.0 (L)  09/21/2020   MCV 96.0 09/21/2020   PLT 98 (L) 09/21/2020    Recent Labs  Lab 09/21/20 0505  NA 135  K 4.6  CL 101  CO2 24  BUN 50*  CREATININE 6.45*  CALCIUM 8.8*  GLUCOSE 111*    Lab Results  Component Value Date   DDIMER 0.60 (H) 09/20/2020     Radiology Studies    DG Chest 2 View  Result Date: 09/20/2020 CLINICAL DATA:  Chest pain and shortness of breath. EXAM: CHEST - 2 VIEW COMPARISON:  Radiograph 09/09/2019 FINDINGS: Chronic cardiomegaly. This is unchanged from prior exam. Unchanged mediastinal contours with aortic atherosclerosis. No confluent airspace disease. There is mild peribronchial thickening. Trace fluid in the fissures. No large subpulmonic effusion. No pneumothorax. No acute osseous abnormalities are seen IMPRESSION: Chronic cardiomegaly. Mild peribronchial thickening suspicious for pulmonary edema. Minimal fluid in the fissures. Electronically Signed   By: Keith Rake M.D.   On: 09/20/2020 17:37   CT ANGIO CHEST AORTA W/CM & OR WO/CM  Result Date: 09/20/2020 CLINICAL DATA:  Chest pain short of breath EXAM: CT ANGIOGRAPHY CHEST WITH CONTRAST TECHNIQUE: Multidetector CT imaging of the chest was performed using  the standard protocol during bolus administration of intravenous contrast. Multiplanar CT image reconstructions and MIPs were obtained to evaluate the vascular anatomy. CONTRAST:  24m OMNIPAQUE IOHEXOL 350 MG/ML SOLN COMPARISON:  Chest x-ray 09/20/2020 FINDINGS: Cardiovascular: Non contrasted images of the chest demonstrate no acute intramural hematoma. Moderate aortic atherosclerosis. Negative for aneurysm or aortic dissection. Coronary vascular calcifications. Cardiomegaly with small pericardial effusion. No gross central filling defects within the pulmonary arteries. Mediastinum/Nodes: Midline trachea. Status post right thyroidectomy. Low right paratracheal node measuring 11 mm. Esophagus within normal limits. Lungs/Pleura: Lungs are clear. No pleural effusion or pneumothorax. Upper Abdomen: Gallstones. Questionable mild gallbladder wall thickening or edema. Atrophic appearing left kidney with multiple cysts. Musculoskeletal: Osteophytosis of the spine with large beak like osteophyte at T10-T11. Review of the MIP images confirms the above findings. IMPRESSION: 1. Negative for acute aortic aneurysm or dissection. No gross acute central embolus seen. 2. Cardiomegaly with small pericardial effusion 3. Gallstones. Possible mild gallbladder wall thickening or edema, suggest correlation with ultrasound Aortic Atherosclerosis (ICD10-I70.0). Electronically Signed   By: KDonavan FoilM.D.   On: 09/20/2020 18:36    ECG & Cardiac Imaging    Afib, 86, no acute ST/T changes - personally reviewed.  Assessment & Plan    1.  Chest pain/non-STEMI: Patient without known prior history of coronary artery disease who in 2019 was noted to have demand ischemia in the setting of rapid A. fib and SVT.  [facility] and he was seen by cardiology without further work-up.  He has not seen cardiology since.  He notes that over the past few years, he has had progressive predominantly  right-sided chest discomfort often associated with dyspnea, now occurring several days per week, lasting up to an hour, and usually resolving spontaneously.  He had a more prolonged and severe episode on July 17 prompting ER evaluation.  Here, ECG was without acute changes.  Initial troponin was 338 with a follow-up of 296.  He is currently chest pain-free.  He remains on a heparin infusion.  CT of the chest was negative for PE or dissection but did show coronary calcium.  Gallstones and possible mild gallbladder wall thickening or edema also noted.  With prior history of peripheral arterial disease status post left AKA  and right BKA, patient very likely has coronary artery disease.  Given progression of symptoms with current findings, we will plan on diagnostic catheterization tomorrow.  The patient understands that risks include but are not limited to stroke (1 in 1000), death (1 in 58), kidney failure [usually temporary] (1 in 500), bleeding (1 in 200), allergic reaction [possibly serious] (1 in 200), and agrees to proceed.  Last dose of eliquis was 7/17 in the AM.  Continue statin, beta-blocker, and heparin.  I will add low-dose aspirin.  2.  End-stage renal disease: On Tuesday, Thursday, Saturday dialysis.  Nephrology following.  3.  Hypotension: Stable on midodrine.  4.  Hyperlipidemia: Continue statin therapy.  5.  Normocytic anemia: This appears to be relatively stable over the past 2 years.  Follow.  Signed, Murray Hodgkins, NP 09/21/2020, 12:09 PM  For questions or updates, please contact   Please consult [email or web address] for contact info under Cardiology/STEMI.

## 2020-09-21 NOTE — Consult Note (Signed)
Cardiology Consult    Patient ID: [facility] MRN: [id number], DOB/AGE: 04-10-1945   Admit date: 09/20/2020 Date of Consult: 09/21/2020  Primary Physician: Marsh Dolly, MD Primary Cardiologist: New - T. Rockey Situ, MD  Requesting Provider: Lenise Herald, MD  Patient Profile    Antonio Phillips is a 75 y.o. male with a history of PAD, permanent Afib on eliquis, end-stage renal disease on hemodialysis, hypotension, morbid obesity, reported history of diabetes, and demand ischemia, who is being seen today for the evaluation of chest pain and non-STEMI at the request of Dr. Jimmye Norman.  Past Medical History   Past Medical History:  Diagnosis Date   Demand ischemia ([place])    a. 11/2017 elev Trop in setting of AFib/SVT-->Echo [facility]): EF>55%. Mild LVH. Nl RV fxn.   Diabetes ([place])    a. Pt states that he has no hx of diabetes--A1c 5.5 11/2018.   ESRD (end stage renal disease) ([place])    a. On HD since 2019.   Hyperlipidemia    Hypotension    a. On midodrine.   Morbid obesity ([place])    PAD (peripheral artery disease) ([place])    a. s/p L AKA and R BKA.   Permanent atrial fibrillation ([place])    a. Dx 2019. CHA2DS2VASc = 3-4-->Eliquis.    Past Surgical History:  Procedure Laterality Date   AV FISTULA REPAIR     BELOW KNEE LEG AMPUTATION Bilateral    CHOLECYSTECTOMY     TOTAL HIP ARTHROPLASTY Bilateral      Allergies  No Known Allergies  History of Present Illness    75 year old male with above past medical history including peripheral arterial disease, permanent atrial fibrillation on Eliquis, end-stage renal disease on hemodialysis, hypotension, morbid obesity, reported history of diabetes, and demand ischemia.  He had progressive renal failure and subsequently began dialysis in 2019.  In September 2019, he was admitted to [facility] with chest pain and rapid A. fib/SVT.  Troponin was mildly elevated.  Echocardiogram showed an EF greater than 55%.  He was seen by cardiology, and it was felt  that troponin elevation was secondary to demand ischemia with recommendation for beta-blocker and oral anticoagulation.  Patient does not have outpatient cardiology follow-up.  He lives locally at a nursing facility in the setting of prior left AKA and right BKA, his mobility is quite limited.  He notes that over the past several years, he has been experiencing intermittent predominantly right-sided, dull chest discomfort sometimes associated with dyspnea, lasting anywhere from 15 minutes to an hour, and resolving spontaneously.  Over the past 6 months, symptoms have progressed and have now been occurring a few times a week.  He notes that he has had similar symptoms during dialysis in the past, for which he gets relief with sublingual nitroglycerin.  He was in his usual state of health until late last week, when he began experiencing nausea, vomiting, and diarrhea.  This occurred on 3 straight days but resolved by Saturday, July 16.  He underwent hemodialysis on the 16th without difficulty.  On July 17, while watching TV, he had sudden onset of right-sided chest discomfort, similar to prior episodes.  Symptoms persisted longer than usual, he notified staff at [facility] where he lives, and EMS was called.  In route, he was given 324 mg of aspirin and a 250 mL bolus in the setting of hypotension.  In the ED he received forcibly nitroglycerin without relief followed by intravenous morphine with complete relief.  ECG  showed A. fib without acute ST or T changes.  CTA of the chest was negative for PE or dissection.  Coronary calcium was noted.  Gallstones and possible mild gallbladder wall thickening or edema were noted.  High-sensitivity troponin was elevated at 338 with a follow-up value of 296.  Patient was placed on heparin and admitted.  He has not had any recurrent chest pain.  Inpatient Medications     atorvastatin  40 mg Oral QHS   carvedilol  3.125 mg Oral BID WC   [START ON 09/22/2020] midodrine   10 mg Oral Q T,Th,Sa-HD   midodrine  5 mg Oral Daily   pregabalin  75 mg Oral QHS   rOPINIRole  1 mg Oral QHS   sevelamer carbonate  1,600 mg Oral BID WC   sevelamer carbonate  800 mg Oral Once per day on Sun Mon Wed Fri    Family History    History reviewed. No pertinent family history. He indicated that his mother is deceased. He indicated that his father is deceased.  He denies any family history of premature coronary artery disease.  Social History    Social History   Socioeconomic History   Marital status: Single    Spouse name: Not on file   Number of children: Not on file   Years of education: Not on file   Highest education level: Not on file  Occupational History   Not on file  Tobacco Use   Smoking status: Never   Smokeless tobacco: Never  Substance and Sexual Activity   Alcohol use: Never   Drug use: Never   Sexual activity: Not on file  Other Topics Concern   Not on file  Social History Narrative   Lives @ local nsg facility.  Does not routinely exercise.   Social Determinants of Health   Financial Resource Strain: Not on file  Food Insecurity: Not on file  Transportation Needs: Not on file  Physical Activity: Not on file  Stress: Not on file  Social Connections: Not on file  Intimate Partner Violence: Not on file     Review of Systems    General:  No chills, fever, night sweats or weight changes.  Cardiovascular:  +++ chest pain, +++ dyspnea on exertion, no edema, orthopnea, palpitations, paroxysmal nocturnal dyspnea. Dermatological: No rash, lesions/masses Respiratory: No cough, +++ dyspnea Urologic: No hematuria, dysuria Abdominal:   +++ nausea, +++ vomiting, +++ diarrhea, no bright red blood per rectum, melena, or hematemesis Neurologic:  No visual changes, wkns, changes in mental status. All other systems reviewed and are otherwise negative except as noted above.  Physical Exam    Blood pressure 105/72, pulse 94, temperature 97.7 F (36.5  C), temperature source Oral, resp. rate 17, height '5\' 11"'$  (1.803 m), weight 99.8 kg, SpO2 98 %.  General: Pleasant, NAD Psych: Flat affect. Neuro: Alert and oriented X 3. Moves all extremities spontaneously. HEENT: Normal  Neck: Supple without bruits.  JVP difficult to gauge secondary to body habitus. Lungs:  Resp regular and unlabored, CTA. Heart: Irregularly irregular, no s3, s4, or murmurs. Abdomen: Obese, soft, non-tender, non-distended, BS + x 4.  Extremities: No clubbing, cyanosis.  Left AKA, right BKA. Radials 2+ and equal bilaterally.  Labs    Cardiac Enzymes Recent Labs  Lab 09/20/20 1712 09/20/20 1900  TROPONINIHS 338* 296*      Lab Results  Component Value Date   WBC 4.6 09/21/2020   HGB 8.9 (L) 09/21/2020   HCT 29.0 (L)  09/21/2020   MCV 96.0 09/21/2020   PLT 98 (L) 09/21/2020    Recent Labs  Lab 09/21/20 0505  NA 135  K 4.6  CL 101  CO2 24  BUN 50*  CREATININE 6.45*  CALCIUM 8.8*  GLUCOSE 111*    Lab Results  Component Value Date   DDIMER 0.60 (H) 09/20/2020     Radiology Studies    DG Chest 2 View  Result Date: 09/20/2020 CLINICAL DATA:  Chest pain and shortness of breath. EXAM: CHEST - 2 VIEW COMPARISON:  Radiograph 09/09/2019 FINDINGS: Chronic cardiomegaly. This is unchanged from prior exam. Unchanged mediastinal contours with aortic atherosclerosis. No confluent airspace disease. There is mild peribronchial thickening. Trace fluid in the fissures. No large subpulmonic effusion. No pneumothorax. No acute osseous abnormalities are seen IMPRESSION: Chronic cardiomegaly. Mild peribronchial thickening suspicious for pulmonary edema. Minimal fluid in the fissures. Electronically Signed   By: Keith Rake M.D.   On: 09/20/2020 17:37   CT ANGIO CHEST AORTA W/CM & OR WO/CM  Result Date: 09/20/2020 CLINICAL DATA:  Chest pain short of breath EXAM: CT ANGIOGRAPHY CHEST WITH CONTRAST TECHNIQUE: Multidetector CT imaging of the chest was performed using  the standard protocol during bolus administration of intravenous contrast. Multiplanar CT image reconstructions and MIPs were obtained to evaluate the vascular anatomy. CONTRAST:  59m OMNIPAQUE IOHEXOL 350 MG/ML SOLN COMPARISON:  Chest x-ray 09/20/2020 FINDINGS: Cardiovascular: Non contrasted images of the chest demonstrate no acute intramural hematoma. Moderate aortic atherosclerosis. Negative for aneurysm or aortic dissection. Coronary vascular calcifications. Cardiomegaly with small pericardial effusion. No gross central filling defects within the pulmonary arteries. Mediastinum/Nodes: Midline trachea. Status post right thyroidectomy. Low right paratracheal node measuring 11 mm. Esophagus within normal limits. Lungs/Pleura: Lungs are clear. No pleural effusion or pneumothorax. Upper Abdomen: Gallstones. Questionable mild gallbladder wall thickening or edema. Atrophic appearing left kidney with multiple cysts. Musculoskeletal: Osteophytosis of the spine with large beak like osteophyte at T10-T11. Review of the MIP images confirms the above findings. IMPRESSION: 1. Negative for acute aortic aneurysm or dissection. No gross acute central embolus seen. 2. Cardiomegaly with small pericardial effusion 3. Gallstones. Possible mild gallbladder wall thickening or edema, suggest correlation with ultrasound Aortic Atherosclerosis (ICD10-I70.0). Electronically Signed   By: KDonavan FoilM.D.   On: 09/20/2020 18:36    ECG & Cardiac Imaging    Afib, 86, no acute ST/T changes - personally reviewed.  Assessment & Plan    1.  Chest pain/non-STEMI: Patient without known prior history of coronary artery disease who in 2019 was noted to have demand ischemia in the setting of rapid A. fib and SVT.  [facility] and he was seen by cardiology without further work-up.  He has not seen cardiology since.  He notes that over the past few years, he has had progressive predominantly  right-sided chest discomfort often associated with dyspnea, now occurring several days per week, lasting up to an hour, and usually resolving spontaneously.  He had a more prolonged and severe episode on July 17 prompting ER evaluation.  Here, ECG was without acute changes.  Initial troponin was 338 with a follow-up of 296.  He is currently chest pain-free.  He remains on a heparin infusion.  CT of the chest was negative for PE or dissection but did show coronary calcium.  Gallstones and possible mild gallbladder wall thickening or edema also noted.  With prior history of peripheral arterial disease status post left AKA  and right BKA, patient very likely has coronary artery disease.  Given progression of symptoms with current findings, we will plan on diagnostic catheterization tomorrow.  The patient understands that risks include but are not limited to stroke (1 in 1000), death (1 in 48), kidney failure [usually temporary] (1 in 500), bleeding (1 in 200), allergic reaction [possibly serious] (1 in 200), and agrees to proceed.  Last dose of eliquis was 7/17 in the AM.  Continue statin, beta-blocker, and heparin.  I will add low-dose aspirin.  2.  End-stage renal disease: On Tuesday, Thursday, Saturday dialysis.  Nephrology following.  3.  Hypotension: Stable on midodrine.  4.  Hyperlipidemia: Continue statin therapy.  5.  Normocytic anemia: This appears to be relatively stable over the past 2 years.  Follow.  Signed, Murray Hodgkins, NP 09/21/2020, 12:09 PM  For questions or updates, please contact   Please consult [email or web address] for contact info under Cardiology/STEMI.

## 2020-09-21 NOTE — Consult Note (Signed)
WOC Nurse Consult Note: Patient receiving care in [facility]. Patient able to turn self for skin assessment. Reason for Consult: "multiple wounds to posterior" Wound type: Scattered, shallow ulcerations to bilateral buttocks, posterior upper thighs; some are intact pustules.  Patient tells me they started 10 months ago, and when in process of evolving they itch, hurt, and sting. Pressure Injury POA: Yes/No/NA Measurement: na Wound bed: all are pink and clean, except one which is an intact pustule to the inner, upper left thigh Drainage (amount, consistency, odor) none Periwound: intact with a number of healed areas Dressing procedure/placement/frequency: Wash pustules and ulcerations on bilateral posterior thighs/buttocks with soap and water. Apply foam dressings over the areas.  The foam dressing can stay in place up to 3 days. Change every three days and prn soilage/dislodgement. Monitor the wound area(s) for worsening of condition such as: Signs/symptoms of infection,  Increase in size,  Development of or worsening of odor, Development of pain, or increased pain at the affected locations.  Notify the medical team if any of these develop.  Thank you for the consult.  Discussed plan of care with the patient and bedside nurse.  [facility] nurse will not follow at this time.  Please re-consult the [facility] team if needed.  Val Riles, RN, MSN, CWOCN, CNS-BC, pager [phone]

## 2020-09-21 NOTE — Consult Note (Signed)
[place] for heparin infusion Indication: chest pain/ACS  No Known Allergies  Patient Measurements: Height: '5\' 11"'$  (180.3 cm) Weight: 99.8 kg (220 lb) IBW/kg (Calculated) : 75.3 Heparin Dosing Weight: 95.8 kg   Vital Signs: Temp: 98.2 F (36.8 C) (07/18 0606) Temp Source: Oral (07/18 0606) BP: 103/78 (07/18 0606) Pulse Rate: 75 (07/18 0606)  Labs: Recent Labs    09/20/20 1712 09/20/20 1900 09/21/20 0503 09/21/20 0505  HGB 9.2*  --   --  8.9*  HCT 29.6*  --   --  29.0*  PLT 103*  --   --  98*  APTT 43*  --  >160*  --   HEPARINUNFRC >1.10*  --   --   --   CREATININE 5.69*  --   --  6.45*  TROPONINIHS 338* 296*  --   --      Estimated Creatinine Clearance: 11.9 mL/min (A) (by C-G formula based on SCr of 6.45 mg/dL (H)).   Medical History: Past Medical History:  Diagnosis Date   Atrial fibrillation ([place])    Diabetes ([place])    Pt states that he has no hx of diabetes   ESRD (end stage renal disease) ([facility])     Medications:  Apixaban 5 mg BID (Afib) PTA. Last dose 7/17 @ 1000  Assessment: 75 y/o male with hx of Afib on Eliquis PTA presented with SOB/chest pain. Troponin 338. Pharmacy has been consulted for initiation and management of heparin infusion for ACS/Chest pain.   Baseline: HL > 1.10, aPTT 43  Goal of Therapy:  Heparin level 0.3-0.7 units/ml aPTT 66-102 seconds Monitor platelets by anticoagulation protocol: Yes  07/18 0503 aPTT > 160   Plan:  Per RN, pt states labs drawn from fistula, but does not know it heparin drip was paused.  Phlebotomist in nearby room.  RN to temporarily pause heparin infusion and ordered STAT aPTT level to confirm.  Will continue heparin infusion at 1350 units/hr pending STAT recheck.  Recheck aPTT STAT and HL daily while on heparin.  Transition to HL once aPTT and HL begin to correlate Continue to monitor H&H and platelets  Renda Rolls, PharmD, [facility] 09/21/2020 6:34 AM

## 2020-09-21 NOTE — ED Notes (Signed)
Patient requested dressing change; left glutea (multiple various sized open circular wounds nickel/ dime); right glutea and gluteal fold (circular open wounds/ sanguineus drainage); left inner upper thigh healing circular wound 25 cent piece sized & raised blister); sites cleansed with saline; dry dressing + transparent coverings applied; Mepilex applied to sacrum; patient tolerated well

## 2020-09-21 NOTE — Progress Notes (Signed)
PROGRESS NOTE    Antonio Phillips  [id number] DOB: [birth year] DOA: 09/20/2020 PCP: Marsh Dolly, MD   Assessment & Plan:   Principal Problem:   Shortness of breath Active Problems:   ESRD on dialysis ([place])   Pressure injury of skin   Generalized weakness   Atrial fibrillation, chronic (H[facility])   Acquired thrombophilia (H[facility])   Anemia of chronic disease   Thrombocytopenia ([facility])   NSTEMI (non-ST elevated myocardial infarction) ([place])  Possible NSTEMI: continue on IV heparin drip. Nitro, morphine prn for pain. Continue on tele. Troponins are elevated. Cardiac cath tomorrow as per cardio   ESRD: on TTS. Nephro consulted    PVD: s/p left AKA & right BKA. Continue on statin   Thrombocytopenia: etiology unclear. Will continue to monitor    HTN: continue on home dose of carvedilol  Likely PAF: continue on home dose of carvedilol and IV heparin drip   HLD: continue on statin   ACD: likely secondary to ESRD. No need for a transfusion currently    RLS: continue on home dose of ropinirole   Ventral hernia: reducible    DVT prophylaxis: IV heparin  Code Status: full  Family Communication:  Disposition Plan: likely back to [facility]   Level of care: Progressive Cardiac  Status is: Inpatient  Remains inpatient appropriate because:Ongoing diagnostic testing needed not appropriate for outpatient work up, IV treatments appropriate due to intensity of illness or inability to take PO, and Inpatient level of care appropriate due to severity of illness  Dispo: The patient is from:  [facility]              Anticipated d/c is to:  [facility]              Patient currently is not medically stable to d/c.   Difficult to place patient No          Consultants:  cardio  Procedures:   Antimicrobials:   Subjective: Pt c/o chest pain   Objective: Vitals:   09/21/20 0430 09/21/20 0500 09/21/20 0606 09/21/20 0749  BP: 107/68 109/60 103/78 102/65  Pulse:   75 91  Resp: '14  13  17  '$ Temp:   98.2 F (36.8 C) 97.8 F (36.6 C)  TempSrc:   Oral   SpO2: 91% 95% 100% 100%  Weight:      Height:       No intake or output data in the 24 hours ending 09/21/20 0822 Filed Weights   09/20/20 1704  Weight: 99.8 kg    Examination:  General exam: Appears calm and comfortable  Respiratory system: Clear to auscultation. Respiratory effort normal. Cardiovascular system: S1 & S2 +. No  rubs, gallops or clicks.  Gastrointestinal system: Abdomen is nondistended, soft and nontender.Normal bowel sounds heard. Central nervous system: Alert and oriented.  Psychiatry: Judgement and insight appear normal. Flat mood and affect    Data Reviewed: I have personally reviewed following labs and imaging studies  CBC: Recent Labs  Lab 09/20/20 1712 09/21/20 0505  WBC 5.2 4.6  HGB 9.2* 8.9*  HCT 29.6* 29.0*  MCV 96.4 96.0  PLT 103* 98*   Basic Metabolic Panel: Recent Labs  Lab 09/20/20 1712 09/21/20 0505  NA 134* 135  K 4.7 4.6  CL 101 101  CO2 23 24  GLUCOSE 143* 111*  BUN 44* 50*  CREATININE 5.69* 6.45*  CALCIUM 8.9 8.8*   GFR: Estimated Creatinine Clearance: 11.9 mL/min (A) (by C-G formula based on SCr  of 6.45 mg/dL (H)). Liver Function Tests: No results for input(s): AST, ALT, ALKPHOS, BILITOT, PROT, ALBUMIN in the last 168 hours. No results for input(s): LIPASE, AMYLASE in the last 168 hours. No results for input(s): AMMONIA in the last 168 hours. Coagulation Profile: No results for input(s): INR, PROTIME in the last 168 hours. Cardiac Enzymes: No results for input(s): CKTOTAL, CKMB, CKMBINDEX, TROPONINI in the last 168 hours. BNP (last 3 results) No results for input(s): PROBNP in the last 8760 hours. HbA1C: No results for input(s): HGBA1C in the last 72 hours. CBG: Recent Labs  Lab 09/20/20 1727  GLUCAP 145*   Lipid Profile: No results for input(s): CHOL, HDL, LDLCALC, TRIG, CHOLHDL, LDLDIRECT in the last 72 hours. Thyroid Function  Tests: Recent Labs    09/20/20 1900  TSH 3.605   Anemia Panel: No results for input(s): VITAMINB12, FOLATE, FERRITIN, TIBC, IRON, RETICCTPCT in the last 72 hours. Sepsis Labs: No results for input(s): PROCALCITON, LATICACIDVEN in the last 168 hours.  No results found for this or any previous visit (from the past 240 hour(s)).       Radiology Studies: DG Chest 2 View  Result Date: 09/20/2020 CLINICAL DATA:  Chest pain and shortness of breath. EXAM: CHEST - 2 VIEW COMPARISON:  Radiograph 09/09/2019 FINDINGS: Chronic cardiomegaly. This is unchanged from prior exam. Unchanged mediastinal contours with aortic atherosclerosis. No confluent airspace disease. There is mild peribronchial thickening. Trace fluid in the fissures. No large subpulmonic effusion. No pneumothorax. No acute osseous abnormalities are seen IMPRESSION: Chronic cardiomegaly. Mild peribronchial thickening suspicious for pulmonary edema. Minimal fluid in the fissures. Electronically Signed   By: Keith Rake M.D.   On: 09/20/2020 17:37   CT ANGIO CHEST AORTA W/CM & OR WO/CM  Result Date: 09/20/2020 CLINICAL DATA:  Chest pain short of breath EXAM: CT ANGIOGRAPHY CHEST WITH CONTRAST TECHNIQUE: Multidetector CT imaging of the chest was performed using the standard protocol during bolus administration of intravenous contrast. Multiplanar CT image reconstructions and MIPs were obtained to evaluate the vascular anatomy. CONTRAST:  81m OMNIPAQUE IOHEXOL 350 MG/ML SOLN COMPARISON:  Chest x-ray 09/20/2020 FINDINGS: Cardiovascular: Non contrasted images of the chest demonstrate no acute intramural hematoma. Moderate aortic atherosclerosis. Negative for aneurysm or aortic dissection. Coronary vascular calcifications. Cardiomegaly with small pericardial effusion. No gross central filling defects within the pulmonary arteries. Mediastinum/Nodes: Midline trachea. Status post right thyroidectomy. Low right paratracheal node measuring 11 mm.  Esophagus within normal limits. Lungs/Pleura: Lungs are clear. No pleural effusion or pneumothorax. Upper Abdomen: Gallstones. Questionable mild gallbladder wall thickening or edema. Atrophic appearing left kidney with multiple cysts. Musculoskeletal: Osteophytosis of the spine with large beak like osteophyte at T10-T11. Review of the MIP images confirms the above findings. IMPRESSION: 1. Negative for acute aortic aneurysm or dissection. No gross acute central embolus seen. 2. Cardiomegaly with small pericardial effusion 3. Gallstones. Possible mild gallbladder wall thickening or edema, suggest correlation with ultrasound Aortic Atherosclerosis (ICD10-I70.0). Electronically Signed   By: KDonavan FoilM.D.   On: 09/20/2020 18:36        Scheduled Meds:  atorvastatin  40 mg Oral QHS   carvedilol  3.125 mg Oral BID WC   [START ON 09/22/2020] midodrine  10 mg Oral Q T,Th,Sa-HD   midodrine  5 mg Oral Daily   pregabalin  75 mg Oral QHS   rOPINIRole  1 mg Oral QHS   sevelamer carbonate  1,600 mg Oral BID WC   sevelamer carbonate  800 mg Oral Once per  day on Sun Mon Wed Fri   Continuous Infusions:  heparin 1,350 Units/hr (09/20/20 2009)     LOS: 0 days    Time spent: 34 mins    Wyvonnia Dusky, MD [facility] Pager [phone]  If 7PM-7AM, please contact night-coverage 09/21/2020, 8:22 AM

## 2020-09-22 ENCOUNTER — Inpatient Hospital Stay
Admission: EM | Disposition: A | Discharge status: Skilled Nursing Facility | Payer: Self-pay | Source: Skilled Nursing Facility | Attending: Internal Medicine

## 2020-09-22 DIAGNOSIS — I2510 Atherosclerotic heart disease of native coronary artery without angina pectoris: Secondary | ICD-10-CM

## 2020-09-22 HISTORY — PX: LEFT HEART CATH AND CORONARY ANGIOGRAPHY: CATH118249

## 2020-09-22 LAB — CBC
HCT: 26.1 % — ABNORMAL LOW (ref 39.0–52.0)
Hemoglobin: 8.3 g/dL — ABNORMAL LOW (ref 13.0–17.0)
MCH: 30.6 pg (ref 26.0–34.0)
MCHC: 31.8 g/dL (ref 30.0–36.0)
MCV: 96.3 fL (ref 80.0–100.0)
Platelets: 96 10*3/uL — ABNORMAL LOW (ref 150–400)
RBC: 2.71 MIL/uL — ABNORMAL LOW (ref 4.22–5.81)
RDW: 16.9 % — ABNORMAL HIGH (ref 11.5–15.5)
WBC: 4 10*3/uL (ref 4.0–10.5)
nRBC: 0 % (ref 0.0–0.2)

## 2020-09-22 LAB — BASIC METABOLIC PANEL
Anion gap: 12 (ref 5–15)
BUN: 54 mg/dL — ABNORMAL HIGH (ref 8–23)
CO2: 22 mmol/L (ref 22–32)
Calcium: 8.4 mg/dL — ABNORMAL LOW (ref 8.9–10.3)
Chloride: 99 mmol/L (ref 98–111)
Creatinine, Ser: 6.74 mg/dL — ABNORMAL HIGH (ref 0.61–1.24)
GFR, Estimated: 8 mL/min — ABNORMAL LOW (ref >60)
Glucose, Bld: 125 mg/dL — ABNORMAL HIGH (ref 70–99)
Potassium: 5.3 mmol/L — ABNORMAL HIGH (ref 3.5–5.1)
Sodium: 133 mmol/L — ABNORMAL LOW (ref 135–145)

## 2020-09-22 LAB — HEPARIN LEVEL (UNFRACTIONATED): Heparin Unfractionated: >1.1 IU/mL — ABNORMAL HIGH (ref 0.30–0.70)

## 2020-09-22 LAB — APTT: aPTT: 79 seconds — ABNORMAL HIGH (ref 24–36)

## 2020-09-22 LAB — POCT ACTIVATED CLOTTING TIME
Activated Clotting Time: 265 seconds
Activated Clotting Time: 271 seconds

## 2020-09-22 SURGERY — LEFT HEART CATH AND CORONARY ANGIOGRAPHY
Anesthesia: Moderate Sedation

## 2020-09-22 MED ORDER — APIXABAN 5 MG PO TABS
5.0000 mg | ORAL_TABLET | Freq: Two times a day (BID) | ORAL | Status: DC
  Administered 2020-09-23: 5 mg via ORAL
  Filled 2020-09-22: qty 1

## 2020-09-22 MED ORDER — ALUM & MAG HYDROXIDE-SIMETH 200-200-20 MG/5ML PO SUSP
ORAL | Status: DC | PRN
Start: 2020-09-22 — End: 2020-09-22
  Administered 2020-09-22: 30 mL via ORAL

## 2020-09-22 MED ORDER — DICLOFENAC SODIUM 1 % EX GEL
2.0000 g | Freq: Two times a day (BID) | CUTANEOUS | Status: DC
  Administered 2020-09-22 – 2020-09-23 (×3): 2 g via TOPICAL
  Filled 2020-09-22 (×2): qty 100

## 2020-09-22 MED ORDER — LIDOCAINE HCL (PF) 1 % IJ SOLN
INTRAMUSCULAR | Status: DC | PRN
  Administered 2020-09-22: 30 mL

## 2020-09-22 MED ORDER — OXYCODONE HCL 5 MG PO TABS
5.0000 mg | ORAL_TABLET | ORAL | Status: DC | PRN
  Administered 2020-09-23: 5 mg via ORAL
  Administered 2020-09-23: 10 mg via ORAL
  Filled 2020-09-22 (×2): qty 2

## 2020-09-22 MED ORDER — FAMOTIDINE 20 MG PO TABS
ORAL_TABLET | ORAL | Status: AC
  Filled 2020-09-22: qty 1

## 2020-09-22 MED ORDER — CLOPIDOGREL BISULFATE 75 MG PO TABS
ORAL_TABLET | ORAL | Status: AC
  Filled 2020-09-22: qty 8

## 2020-09-22 MED ORDER — HEPARIN SODIUM (PORCINE) 1000 UNIT/ML IJ SOLN
INTRAMUSCULAR | Status: DC | PRN
  Administered 2020-09-22: 8000 [IU] via INTRAVENOUS
  Administered 2020-09-22: 2000 [IU] via INTRAVENOUS

## 2020-09-22 MED ORDER — HEPARIN (PORCINE) IN NACL 1000-0.9 UT/500ML-% IV SOLN
INTRAVENOUS | Status: AC
  Filled 2020-09-22: qty 1000

## 2020-09-22 MED ORDER — LABETALOL HCL 5 MG/ML IV SOLN: 10.0000 mg | INTRAVENOUS | Status: AC | PRN

## 2020-09-22 MED ORDER — NITROGLYCERIN 1 MG/10 ML FOR IR/CATH LAB
INTRA_ARTERIAL | Status: DC | PRN
  Administered 2020-09-22 (×2): 100 ug via INTRACORONARY

## 2020-09-22 MED ORDER — IOHEXOL 350 MG/ML SOLN
INTRAVENOUS | Status: DC | PRN
  Administered 2020-09-22: 110 mL

## 2020-09-22 MED ORDER — MIDAZOLAM HCL 2 MG/2ML IJ SOLN
INTRAMUSCULAR | Status: AC
  Filled 2020-09-22: qty 2

## 2020-09-22 MED ORDER — SODIUM CHLORIDE 0.9% FLUSH: 3.0000 mL | INTRAVENOUS | Status: DC | PRN

## 2020-09-22 MED ORDER — MIDODRINE HCL 5 MG PO TABS
10.0000 mg | ORAL_TABLET | Freq: Once | ORAL | Status: AC
  Administered 2020-09-22: 10 mg via ORAL

## 2020-09-22 MED ORDER — CLOPIDOGREL BISULFATE 75 MG PO TABS
75.0000 mg | ORAL_TABLET | Freq: Every day | ORAL | Status: DC
  Administered 2020-09-23: 75 mg via ORAL
  Filled 2020-09-22: qty 1

## 2020-09-22 MED ORDER — LIDOCAINE HCL (PF) 1 % IJ SOLN
INTRAMUSCULAR | Status: AC
  Filled 2020-09-22: qty 30

## 2020-09-22 MED ORDER — HEPARIN SODIUM (PORCINE) 1000 UNIT/ML IJ SOLN
INTRAMUSCULAR | Status: AC
  Filled 2020-09-22: qty 1

## 2020-09-22 MED ORDER — SODIUM CHLORIDE 0.9 % IV SOLN: 250.0000 mL | INTRAVENOUS | Status: DC | PRN

## 2020-09-22 MED ORDER — SODIUM CHLORIDE 0.9% FLUSH
3.0000 mL | Freq: Two times a day (BID) | INTRAVENOUS | Status: DC
  Administered 2020-09-22: 3 mL via INTRAVENOUS

## 2020-09-22 MED ORDER — HYDRALAZINE HCL 20 MG/ML IJ SOLN: 10.0000 mg | INTRAMUSCULAR | Status: AC | PRN

## 2020-09-22 MED ORDER — HEPARIN (PORCINE) IN NACL 2000-0.9 UNIT/L-% IV SOLN
INTRAVENOUS | Status: DC | PRN
  Administered 2020-09-22: 1000 mL

## 2020-09-22 MED ORDER — MIDAZOLAM HCL 2 MG/2ML IJ SOLN
INTRAMUSCULAR | Status: DC | PRN
  Administered 2020-09-22: 1 mg via INTRAVENOUS

## 2020-09-22 MED ORDER — CLOPIDOGREL BISULFATE 300 MG PO TABS
ORAL_TABLET | ORAL | Status: DC | PRN
  Administered 2020-09-22: 600 mg via ORAL

## 2020-09-22 MED ORDER — FAMOTIDINE 20 MG PO TABS
ORAL_TABLET | ORAL | Status: DC | PRN
  Administered 2020-09-22: 20 mg via ORAL

## 2020-09-22 MED ORDER — ALUM & MAG HYDROXIDE-SIMETH 200-200-20 MG/5ML PO SUSP
ORAL | Status: AC
  Filled 2020-09-22: qty 30

## 2020-09-22 SURGICAL SUPPLY — 25 items
BALLN EUPHORA RX 2.0X12 (BALLOONS) ×2
BALLN ~~LOC~~ EUPHORA RX 2.25X12 (BALLOONS) ×2
BALLN ~~LOC~~ TREK RX 2.75X12 (BALLOONS)
BALLN ~~LOC~~ TREK RX 3.0X8 (BALLOONS) ×2
BALLOON EUPHORA RX 2.0X12 (BALLOONS) ×1 IMPLANT
BALLOON ~~LOC~~ EUPHORA RX 2.25X12 (BALLOONS) ×1 IMPLANT
BALLOON ~~LOC~~ TREK RX 2.75X12 (BALLOONS) IMPLANT
BALLOON ~~LOC~~ TREK RX 3.0X8 (BALLOONS) ×1 IMPLANT
CATH INFINITI 5FR JL4 (CATHETERS) ×2 IMPLANT
CATH INFINITI JR4 5F (CATHETERS) ×2 IMPLANT
CATH LAUNCHER 6FR EBU3.5 (CATHETERS) ×2 IMPLANT
CLOSURE PERCLOSE PROSTYLE (VASCULAR PRODUCTS) ×2 IMPLANT
KIT ENCORE 26 ADVANTAGE (KITS) ×2 IMPLANT
KIT MICROPUNCTURE NIT STIFF (SHEATH) ×2 IMPLANT
PACK CARDIAC CATH (CUSTOM PROCEDURE TRAY) ×2 IMPLANT
PROTECTION STATION PRESSURIZED (MISCELLANEOUS) ×2
SET ATX SIMPLICITY (MISCELLANEOUS) ×2 IMPLANT
SHEATH AVANTI 5FR X 11CM (SHEATH) ×2 IMPLANT
SHEATH AVANTI 6FR X 11CM (SHEATH) ×2 IMPLANT
STATION PROTECTION PRESSURIZED (MISCELLANEOUS) ×1 IMPLANT
STENT RESOLUTE ONYX 2.5X15 (Permanent Stent) ×2 IMPLANT
TUBING CIL FLEX 10 FLL-RA (TUBING) ×2 IMPLANT
WIRE G HI TQ BMW 190 (WIRE) ×2 IMPLANT
WIRE GUIDERIGHT .035X150 (WIRE) ×2 IMPLANT
WIRE HI TORQ WHISPER MS 190CM (WIRE) ×2 IMPLANT

## 2020-09-22 NOTE — Progress Notes (Signed)
Report given to April in dialysis unit.

## 2020-09-22 NOTE — TOC Initial Note (Signed)
Transition of Care [facility]) - Initial/Assessment Note    Patient Details  Name: Antonio Phillips MRN: [id number] Date of Birth: 08/26/1945  Transition of Care [facility]) CM/SW Contact:    Alberteen Sam, LCSW Phone Number: 09/22/2020, 3:50 PM  Clinical Narrative:                  CSW confirmed with Tiffany at [facility] that patient is long term care with them and will be returning under his Medicaid.   Patient is currently in HD but CSW informed Tiffany of potential dc tomorrow back to facility. She reports admissions will be out tomorrow but to ask for her (Tiffany) when patient is ready to return.    Expected Discharge Plan: [place] Barriers to Discharge: Continued Medical Work up   Patient Goals and CMS Choice Patient states their goals for this hospitalization and ongoing recovery are:: to go to [facility] CMS Medicare.gov Compare Post Acute Care list provided to:: Patient Choice offered to / list presented to : Patient  Expected Discharge Plan and Services Expected Discharge Plan: [place]                                              Prior Living Arrangements/Services                       Activities of Daily Living Home Assistive Devices/Equipment: Wheelchair ADL Screening (condition at time of admission) Patient's cognitive ability adequate to safely complete daily activities?: Yes Is the patient deaf or have difficulty hearing?: No Does the patient have difficulty seeing, even when wearing glasses/contacts?: No Does the patient have difficulty concentrating, remembering, or making decisions?: No Patient able to express need for assistance with ADLs?: Yes Does the patient have difficulty dressing or bathing?: No Independently performs ADLs?: Yes (appropriate for developmental age) Does the patient have difficulty walking or climbing stairs?: Yes Weakness of Legs: None (BKA, AKA) Weakness of Arms/Hands:  None  Permission Sought/Granted                  Emotional Assessment       Orientation: : Oriented to Self, Oriented to  Time, Oriented to Place, Oriented to Situation Alcohol / Substance Use: Not Applicable Psych Involvement: No (comment)  Admission diagnosis:  NSTEMI (non-ST elevated myocardial infarction) ([place]) [I21.4] Patient Active Problem List   Diagnosis Date Noted   Shortness of breath 09/20/2020   NSTEMI (non-ST elevated myocardial infarction) ([place]) 09/20/2020   Fungal skin infection    Atrial fibrillation, chronic (HCC)    Acquired thrombophilia ([place])    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 [place] [address] Phone: [phone] Fax: [phone]     Social Determinants of Health (SDOH) Interventions    Readmission Risk Interventions Readmission Risk Prevention Plan 09/10/2019 11/14/2018 11/09/2018  Transportation Screening Complete Complete Complete  PCP or Specialist Appt within 3-5 Days Patient refused Patient refused -  [place] or [place]  Patient refused - -  Social Work Consult for [place] Planning/Counseling Complete - -  Palliative Care Screening Not Applicable Not Applicable -  Medication Review (RN Care Manager) Complete Complete Complete  Some recent data might be hidden

## 2020-09-22 NOTE — Progress Notes (Signed)
Progress Note  Patient Name: Antonio Phillips Date of Encounter: 09/22/2020  Primary Cardiologist: Ida Rogue, MD  Subjective   No c/p or sob.  No further diarrhea.  On contact precautions b/c of + MRSA nasal swab.  Inpatient Medications    Scheduled Meds:  [MAR Hold] aspirin  81 mg Oral Daily   [MAR Hold] atorvastatin  40 mg Oral QHS   [MAR Hold] carvedilol  3.125 mg Oral BID WC   [MAR Hold] Chlorhexidine Gluconate Cloth  6 each Topical Q0600   [MAR Hold] diclofenac Sodium  2 g Topical BID   [MAR Hold] midodrine  10 mg Oral Q T,Th,Sa-HD   [MAR Hold] midodrine  5 mg Oral Daily   [MAR Hold] mupirocin ointment  1 application Nasal BID   [MAR Hold] pregabalin  75 mg Oral QHS   [MAR Hold] rOPINIRole  1 mg Oral QHS   [MAR Hold] sevelamer carbonate  1,600 mg Oral BID WC   [MAR Hold] sevelamer carbonate  800 mg Oral Once per day on Sun Mon Wed Fri   [facility] Hold] sodium chloride flush  3 mL Intravenous Q12H   Continuous Infusions:  sodium chloride     sodium chloride 10 mL/hr at 09/22/20 0508   [MAR Hold] sodium chloride     [MAR Hold] sodium chloride     heparin 1,000 Units/hr (09/21/20 1916)   PRN Meds: sodium chloride, [MAR Hold] sodium chloride, [MAR Hold] sodium chloride, [MAR Hold] acetaminophen **OR** [MAR Hold] acetaminophen, [MAR Hold] alteplase, clopidogrel, [MAR Hold] heparin, heparin sodium (porcine), [MAR Hold] HYDROcodone-acetaminophen, [MAR Hold] lidocaine (PF), [MAR Hold] lidocaine-prilocaine, [MAR Hold] metoprolol tartrate, midazolam, [MAR Hold]  morphine injection, [MAR Hold] nitroGLYCERIN, nitroGLYCERIN, [MAR Hold] ondansetron **OR** [MAR Hold] ondansetron (ZOFRAN) IV, [MAR Hold] pentafluoroprop-tetrafluoroeth, sodium chloride flush   Vital Signs    Vitals:   09/22/20 0600 09/22/20 0817 09/22/20 1004 09/22/20 1028  BP:  99/61 114/71   Pulse:  91 86   Resp:  18 15   Temp:  (!) 97.5 F (36.4 C) 98.1 F (36.7 C)   TempSrc:  Oral Oral   SpO2:  98% 98%  98%  Weight: 98.9 kg     Height:        Intake/Output Summary (Last 24 hours) at 09/22/2020 1116 Last data filed at 09/21/2020 1622 Gross per 24 hour  Intake 65.12 ml  Output 150 ml  Net -84.88 ml   Filed Weights   09/20/20 1704 09/22/20 0600  Weight: 99.8 kg 98.9 kg    Physical Exam   GEN: Obese, in no acute distress.  HEENT: Grossly normal.  Neck: Supple, no JVD, carotid bruits, or masses. Cardiac: IR, IR no murmurs, rubs, or gallops. No clubbing, cyanosis, edema.  Radials 2+.  LUE AVF 2/ bruit/thrill.  R BKA. L AKA. Respiratory:  Respirations regular and unlabored, clear to auscultation bilaterally. GI: Obese, soft, nontender, nondistended, BS + x 4. MS: no deformity or atrophy. Skin: warm and dry, no rash. Neuro:  Strength and sensation are intact. Psych: AAOx3.  Normal affect.  Labs    Chemistry Recent Labs  Lab 09/20/20 1712 09/21/20 0505 09/22/20 0055  NA 134* 135 133*  K 4.7 4.6 5.3*  CL 101 101 99  CO2 '23 24 22  '$ GLUCOSE 143* 111* 125*  BUN 44* 50* 54*  CREATININE 5.69* 6.45* 6.74*  CALCIUM 8.9 8.8* 8.4*  GFRNONAA 10* 8* 8*  ANIONGAP '10 10 12     '$ Hematology Recent Labs  Lab 09/20/20  1712 09/21/20 0505 09/22/20 0055  WBC 5.2 4.6 4.0  RBC 3.07* 3.02* 2.71*  HGB 9.2* 8.9* 8.3*  HCT 29.6* 29.0* 26.1*  MCV 96.4 96.0 96.3  MCH 30.0 29.5 30.6  MCHC 31.1 30.7 31.8  RDW 16.9* 16.8* 16.9*  PLT 103* 98* 96*    Cardiac Enzymes  Recent Labs  Lab 09/20/20 1712 09/20/20 1900  TROPONINIHS 338* 296*      DDimer  Recent Labs  Lab 09/20/20 1712  DDIMER 0.60*    HbA1c  Lab Results  Component Value Date   HGBA1C 5.5 11/10/2018    Radiology    DG Chest 2 View  Result Date: 09/20/2020 CLINICAL DATA:  Chest pain and shortness of breath. EXAM: CHEST - 2 VIEW COMPARISON:  Radiograph 09/09/2019 FINDINGS: Chronic cardiomegaly. This is unchanged from prior exam. Unchanged mediastinal contours with aortic atherosclerosis. No confluent airspace  disease. There is mild peribronchial thickening. Trace fluid in the fissures. No large subpulmonic effusion. No pneumothorax. No acute osseous abnormalities are seen IMPRESSION: Chronic cardiomegaly. Mild peribronchial thickening suspicious for pulmonary edema. Minimal fluid in the fissures. Electronically Signed   By: Keith Rake M.D.   On: 09/20/2020 17:37   CT ANGIO CHEST AORTA W/CM & OR WO/CM  Result Date: 09/20/2020 CLINICAL DATA:  Chest pain short of breath EXAM: CT ANGIOGRAPHY CHEST WITH CONTRAST TECHNIQUE: Multidetector CT imaging of the chest was performed using the standard protocol during bolus administration of intravenous contrast. Multiplanar CT image reconstructions and MIPs were obtained to evaluate the vascular anatomy. CONTRAST:  29m OMNIPAQUE IOHEXOL 350 MG/ML SOLN COMPARISON:  Chest x-ray 09/20/2020 FINDINGS: Cardiovascular: Non contrasted images of the chest demonstrate no acute intramural hematoma. Moderate aortic atherosclerosis. Negative for aneurysm or aortic dissection. Coronary vascular calcifications. Cardiomegaly with small pericardial effusion. No gross central filling defects within the pulmonary arteries. Mediastinum/Nodes: Midline trachea. Status post right thyroidectomy. Low right paratracheal node measuring 11 mm. Esophagus within normal limits. Lungs/Pleura: Lungs are clear. No pleural effusion or pneumothorax. Upper Abdomen: Gallstones. Questionable mild gallbladder wall thickening or edema. Atrophic appearing left kidney with multiple cysts. Musculoskeletal: Osteophytosis of the spine with large beak like osteophyte at T10-T11. Review of the MIP images confirms the above findings. IMPRESSION: 1. Negative for acute aortic aneurysm or dissection. No gross acute central embolus seen. 2. Cardiomegaly with small pericardial effusion 3. Gallstones. Possible mild gallbladder wall thickening or edema, suggest correlation with ultrasound Aortic Atherosclerosis (ICD10-I70.0).  Electronically Signed   By: KDonavan FoilM.D.   On: 09/20/2020 18:36   Telemetry    Afib, 90's - Personally Reviewed  Cardiac Studies   2D Echocardiogram 7.18.2022  1. Left ventricular ejection fraction, by estimation, is 60 to 65%. The  left ventricle has normal function. The left ventricle has no regional  wall motion abnormalities. Left ventricular diastolic parameters are  indeterminate.   2. Right ventricular systolic function is normal. The right ventricular  size is normal. Tricuspid regurgitation signal is inadequate for assessing  PA pressure.   3. Left atrial size was moderately dilated.   4. Right atrial size was moderately dilated.   5. Tricuspid valve regurgitation is mild to moderate.   6. The mitral valve is normal in structure. Mild mitral valve  regurgitation.   7. Rhythym is atrial fibrillation   Patient Profile     75y.o. male with a history of PAD, permanent Afib on eliquis, end-stage renal disease on hemodialysis, hypotension, morbid obesity, reported history of diabetes, and demand ischemia, who  was admitted 7/17 w/ right sided c/p and NSTEMI.  Assessment & Plan    1.  NSTEMI:  Patient without known prior history of coronary artery disease who in 2019 was noted to have demand ischemia in the setting of rapid A. fib and SVT.  Echocardiogram at [facility] at that time showed normal LV function and he was seen by cardiology without further work-up.  He has not seen cardiology since.  He notes that over the past few years, he has had progressive predominantly right-sided chest discomfort often associated with dyspnea, now occurring several days per week, lasting up to an hour, and usually resolving spontaneously.  He had a more prolonged and severe episode on July 17 prompting ER evaluation.  HsTrop peaked @ 338.  CTA chest neg PE.  Cor Ca2+ noted.  No recurrent chest pain.  For cath today.  Cont statin, ? blocker, heparin, asa.  2.  ESRD:  TTS HD.  Nephrology  following.  3.  Hypotension:  stable on midodrine.  4.  HL:  cont statin.  5.  Normocytic anemia:  stable.  6.  Permanent Afib:  rate-controlled on ? blocker.  Eliquis on hold - last dose in AM on 7/17.  Signed, Murray Hodgkins, NP  09/22/2020, 11:16 AM    For questions or updates, please contact   Please consult [email or web address] for contact info under Cardiology/STEMI.

## 2020-09-22 NOTE — Interval H&P Note (Signed)
Cath Lab Visit (complete for each Cath Lab visit)  Clinical Evaluation Leading to the Procedure:   ACS: Yes.    Non-ACS:    Anginal Classification: CCS IV  Anti-ischemic medical therapy: Minimal Therapy (1 class of medications)  Non-Invasive Test Results: No non-invasive testing performed  Prior CABG: No previous CABG      History and Physical Interval Note:  09/22/2020 10:26 AM  Antonio Phillips  has presented today for surgery, with the diagnosis of nstemi.  The various methods of treatment have been discussed with the patient and family. After consideration of risks, benefits and other options for treatment, the patient has consented to  Procedure(s): LEFT HEART CATH AND CORONARY ANGIOGRAPHY (N/A) as a surgical intervention.  The patient's history has been reviewed, patient examined, no change in status, stable for surgery.  I have reviewed the patient's chart and labs.  Questions were answered to the patient's satisfaction.     Sherren Mocha

## 2020-09-22 NOTE — Progress Notes (Signed)
Patient will go to dialysis from [place]. Called RN on 2A Kayla to give report and update on patient.

## 2020-09-22 NOTE — Consult Note (Signed)
ANTICOAGULATION CONSULT NOTE  Pharmacy Consult for heparin infusion Indication: chest pain/ACS  Patient Measurements: Heparin Dosing Weight: 95.8 kg   Labs: Recent Labs    09/20/20 1712 09/20/20 1900 09/21/20 0503 09/21/20 0505 09/21/20 0633 09/21/20 1703 09/22/20 0055  HGB 9.2*  --   --  8.9*  --   --  8.3*  HCT 29.6*  --   --  29.0*  --   --  26.1*  PLT 103*  --   --  98*  --   --  96*  APTT 43*  --    < >  --  96* 71* 79*  HEPARINUNFRC >1.10*  --   --   --   --   --  >1.10*  CREATININE 5.69*  --   --  6.45*  --   --  6.74*  TROPONINIHS 338* 296*  --   --   --   --   --    < > = values in this interval not displayed.     Estimated Creatinine Clearance: 11.4 mL/min (A) (by C-G formula based on SCr of 6.74 mg/dL (H)).   Medical History: Past Medical History:  Diagnosis Date   Demand ischemia ([place])    a. 11/2017 elev Trop in setting of AFib/SVT-->Echo [facility]): EF>55%. Mild LVH. Nl RV fxn.   Diabetes ([place])    a. Pt states that he has no hx of diabetes--A1c 5.5 11/2018.   ESRD (end stage renal disease) ([place])    a. On HD since 2019.   Hyperlipidemia    Hypotension    a. On midodrine.   Morbid obesity ([place])    PAD (peripheral artery disease) ([place])    a. s/p L AKA and R BKA.   Permanent atrial fibrillation ([place])    a. Dx 2019. CHA2DS2VASc = 3-4-->Eliquis.    Medications:  Apixaban 5 mg BID (Afib) PTA. Last dose 7/17 @ 1000  Assessment: 75 y/o male with hx of Afib on Eliquis PTA presented with SOB/chest pain. Troponin 338. Pharmacy has been consulted for initiation and management of heparin infusion for ACS/Chest pain.   Baseline: HL > 1.10, aPTT 43  Goal of Therapy:  Heparin level 0.3-0.7 units/ml aPTT 66-102 seconds Monitor platelets by anticoagulation protocol: Yes  07/18 0503 aPTT > 160  0718 0633 aPTT 96 (re-check, drip paused) decreased to 1000 units/hr 07/18 1703 aPTT 71  07/19 0055 aPTT 79, HL > 1.10   Plan:  7/19 @ 0055:  aPTT = 79 (therapeutic X  2)                         HL > 1.1 (elevated) Will continue pt on current rate and recheck HL and aPTT on 7/20 with AM labs.   Orene Desanctis, PharmD Clinical Pharmacist   09/22/2020 1:54 AM

## 2020-09-22 NOTE — Progress Notes (Signed)
PROGRESS NOTE   HPI was taken from Dr. Tobie Poet: Antonio Phillips is a 75 y.o. male with medical history significant for hypertension, end-stage renal disease on hemodialysis, CAD, status post right below the knee amputation, status post left above-the-knee amputation, obesity, ventral hernia that is reducible, hyperlipidemia, presents to the emergency department from [facility] for chief concerns of chest pain and shortness of breath.   At bedside [name] was able to tell me his full name, age, location of hospital, current calendar year 2022.  He denies chest pain and shortness of breath at my evaluation.   He reports that while watching TV he developed sudden onset of chest pain that he describes as a tight squeezing sensation.  He states the pain was in the right lower sternal border.  He states that the pain was persistent until he came to the emergency department and received IV morphine.  He states that on a scale of 0-10, he states his pain was a 1 out of 10.  He also states that he has had multiple amputations and multiple surgeries and therefore he has a high pain tolerance.  He also endorses nausea and denies vomiting.  He endorses shortness of breath that has also resolved since given morphine.   Social history: He lives at [facility] long-term rehab.  He is estranged with his children in [place].  He denies tobacco, EtOH, recreational drug use.   Hospital course from Dr. Jimmye Norman 7/18-7/19/22: Pt presented w/ NSTEMI and was scheduled for cardiac cath today. Pt was initially placed on IV heparin drip. Of note, pt will continue on HD TTS as per nephro. Pt will likely be able to go back to his home facility ([facility], I think) at d/c.   Antonio Phillips  [id number] DOB: April 30, 1945 DOA: 09/20/2020 PCP: Marsh Dolly, MD   Assessment & Plan:   Principal Problem:   Shortness of breath Active Problems:   ESRD on dialysis ([place])   Pressure injury of skin    Generalized weakness   Atrial fibrillation, chronic (HCC)   Acquired thrombophilia (HCC)   Anemia of chronic disease   Thrombocytopenia (HCC)   NSTEMI (non-ST elevated myocardial infarction) ([place])  Possible NSTEMI: continue on IV heparin drip. Nitro, morphine prn for pain. Continue on tele. Troponins are elevated.  Cardiac cath today  ESRD: on HD TTS. Management as per nephro   PVD: s/p left AKA & right BKA. Continue on statin   Thrombocytopenia: etiology unclear, labile. Will continue to monitor    HTN: continue on home dose BB   Likely PAF: continue on coreg, IV heparin drip   HLD: continue on statin   ACD: likely secondary to ESRD. No need for a transfusion currently    RLS: continue on home dose of ropinirole    Ventral hernia: reducible    DVT prophylaxis: IV heparin  Code Status: full  Family Communication:  Disposition Plan: likely back to [facility]   Level of care: Progressive Cardiac  Status is: Inpatient  Remains inpatient appropriate because:Ongoing diagnostic testing needed not appropriate for outpatient work up, IV treatments appropriate due to intensity of illness or inability to take PO, and Inpatient level of care appropriate due to severity of illness, going for cardiac cath today   Dispo: The patient is from:  [facility]              Anticipated d/c is to:  [facility]  Patient currently is not medically stable to d/c.   Difficult to place patient No      Consultants:  cardio  Procedures:   Antimicrobials:   Subjective: Pt c/o b/l LE stump pain   Objective: Vitals:   09/21/20 2034 09/22/20 0037 09/22/20 0516 09/22/20 0600  BP: (!) 102/57 107/68 105/73   Pulse: 87 94 98   Resp:  16 13   Temp: 97.6 F (36.4 C) 97.8 F (36.6 C) 98.7 F (37.1 C)   TempSrc: Axillary Axillary Axillary   SpO2:  97% 100%   Weight:    98.9 kg  Height:        Intake/Output Summary (Last 24 hours) at 09/22/2020 0800 Last data filed at 09/21/2020  1622 Gross per 24 hour  Intake 65.12 ml  Output 150 ml  Net -84.88 ml   Filed Weights   09/20/20 1704 09/22/20 0600  Weight: 99.8 kg 98.9 kg    Examination:  General exam: Appears comfortable  Respiratory system: Clear breath sounds b/l. Cardiovascular system: S1/S2+. No rubs or clicks  Gastrointestinal system: Abd is soft, NT, ND & hyperactive bowel sounds  Central nervous system: Alert and oriented  Psychiatry: Judgement and insight appear normal. Flat mood and affect    Data Reviewed: I have personally reviewed following labs and imaging studies  CBC: Recent Labs  Lab 09/20/20 1712 09/21/20 0505 09/22/20 0055  WBC 5.2 4.6 4.0  HGB 9.2* 8.9* 8.3*  HCT 29.6* 29.0* 26.1*  MCV 96.4 96.0 96.3  PLT 103* 98* 96*   Basic Metabolic Panel: Recent Labs  Lab 09/20/20 1712 09/21/20 0505 09/22/20 0055  NA 134* 135 133*  K 4.7 4.6 5.3*  CL 101 101 99  CO2 '23 24 22  '$ GLUCOSE 143* 111* 125*  BUN 44* 50* 54*  CREATININE 5.69* 6.45* 6.74*  CALCIUM 8.9 8.8* 8.4*   GFR: Estimated Creatinine Clearance: 11.3 mL/min (A) (by C-G formula based on SCr of 6.74 mg/dL (H)). Liver Function Tests: No results for input(s): AST, ALT, ALKPHOS, BILITOT, PROT, ALBUMIN in the last 168 hours. No results for input(s): LIPASE, AMYLASE in the last 168 hours. No results for input(s): AMMONIA in the last 168 hours. Coagulation Profile: No results for input(s): INR, PROTIME in the last 168 hours. Cardiac Enzymes: No results for input(s): CKTOTAL, CKMB, CKMBINDEX, TROPONINI in the last 168 hours. BNP (last 3 results) No results for input(s): PROBNP in the last 8760 hours. HbA1C: No results for input(s): HGBA1C in the last 72 hours. CBG: Recent Labs  Lab 09/20/20 1727  GLUCAP 145*   Lipid Profile: No results for input(s): CHOL, HDL, LDLCALC, TRIG, CHOLHDL, LDLDIRECT in the last 72 hours. Thyroid Function Tests: Recent Labs    09/20/20 1900  TSH 3.605   Anemia Panel: No results for  input(s): VITAMINB12, FOLATE, FERRITIN, TIBC, IRON, RETICCTPCT in the last 72 hours. Sepsis Labs: No results for input(s): PROCALCITON, LATICACIDVEN in the last 168 hours.  Recent Results (from the past 240 hour(s))  SARS CORONAVIRUS 2 (TAT 6-24 HRS) Nasopharyngeal Nasopharyngeal Swab     Status: None   Collection Time: 09/20/20  9:27 PM   Specimen: Nasopharyngeal Swab  Result Value Ref Range Status   SARS Coronavirus 2 NEGATIVE NEGATIVE Final    Comment: (NOTE) SARS-CoV-2 target nucleic acids are NOT DETECTED.  The SARS-CoV-2 RNA is generally detectable in upper and lower respiratory specimens during the acute phase of infection. Negative results do not preclude SARS-CoV-2 infection, do not rule out co-infections with  other pathogens, and should not be used as the sole basis for treatment or other patient management decisions. Negative results must be combined with clinical observations, patient history, and epidemiological information. The expected result is Negative.  Fact Sheet for Patients: SugarRoll.be  Fact Sheet for Healthcare Providers: https://www.woods-mathews.com/  This test is not yet approved or cleared by the [place] FDA and  has been authorized for detection and/or diagnosis of SARS-CoV-2 by FDA under an Emergency Use Authorization (EUA). This EUA will remain  in effect (meaning this test can be used) for the duration of the COVID-19 declaration under Se ction 564(b)(1) of the Act, 21 U.S.C. section 360bbb-3(b)(1), unless the authorization is terminated or revoked sooner.  Performed at [facility], [place] [address], [place], [place] [address]   MRSA Next Gen by PCR, Nasal     Status: Abnormal   Collection Time: 09/21/20  1:20 PM   Specimen: Nasal Mucosa; Nasal Swab  Result Value Ref Range Status   MRSA by PCR Next Gen DETECTED (A) NOT DETECTED Final    Comment: CRITICAL RESULT CALLED TO, READ BACK BY AND  VERIFIED WITH: Arnetha Courser [id number] 09/21/20 GM (NOTE) The GeneXpert MRSA Assay (FDA approved for NASAL specimens only), is one component of a comprehensive MRSA colonization surveillance program. It is not intended to diagnose MRSA infection nor to guide or monitor treatment for MRSA infections. Test performance is not FDA approved in patients less than 72 years old. Performed at [facility], [address], [place],  [address]          Radiology Studies: DG Chest 2 View  Result Date: 09/20/2020 CLINICAL DATA:  Chest pain and shortness of breath. EXAM: CHEST - 2 VIEW COMPARISON:  Radiograph 09/09/2019 FINDINGS: Chronic cardiomegaly. This is unchanged from prior exam. Unchanged mediastinal contours with aortic atherosclerosis. No confluent airspace disease. There is mild peribronchial thickening. Trace fluid in the fissures. No large subpulmonic effusion. No pneumothorax. No acute osseous abnormalities are seen IMPRESSION: Chronic cardiomegaly. Mild peribronchial thickening suspicious for pulmonary edema. Minimal fluid in the fissures. Electronically Signed   By: Keith Rake M.D.   On: 09/20/2020 17:37   CT ANGIO CHEST AORTA W/CM & OR WO/CM  Result Date: 09/20/2020 CLINICAL DATA:  Chest pain short of breath EXAM: CT ANGIOGRAPHY CHEST WITH CONTRAST TECHNIQUE: Multidetector CT imaging of the chest was performed using the standard protocol during bolus administration of intravenous contrast. Multiplanar CT image reconstructions and MIPs were obtained to evaluate the vascular anatomy. CONTRAST:  2m OMNIPAQUE IOHEXOL 350 MG/ML SOLN COMPARISON:  Chest x-ray 09/20/2020 FINDINGS: Cardiovascular: Non contrasted images of the chest demonstrate no acute intramural hematoma. Moderate aortic atherosclerosis. Negative for aneurysm or aortic dissection. Coronary vascular calcifications. Cardiomegaly with small pericardial effusion. No gross central filling defects within the  pulmonary arteries. Mediastinum/Nodes: Midline trachea. Status post right thyroidectomy. Low right paratracheal node measuring 11 mm. Esophagus within normal limits. Lungs/Pleura: Lungs are clear. No pleural effusion or pneumothorax. Upper Abdomen: Gallstones. Questionable mild gallbladder wall thickening or edema. Atrophic appearing left kidney with multiple cysts. Musculoskeletal: Osteophytosis of the spine with large beak like osteophyte at T10-T11. Review of the MIP images confirms the above findings. IMPRESSION: 1. Negative for acute aortic aneurysm or dissection. No gross acute central embolus seen. 2. Cardiomegaly with small pericardial effusion 3. Gallstones. Possible mild gallbladder wall thickening or edema, suggest correlation with ultrasound Aortic Atherosclerosis (ICD10-I70.0). Electronically Signed   By: KDonavan FoilM.D.   On: 09/20/2020  18:36   ECHOCARDIOGRAM COMPLETE  Result Date: 09/21/2020    ECHOCARDIOGRAM REPORT   Patient Name:   ISAYA CARIAS [facility] Date of Exam: 09/21/2020 Medical Rec #:  [id number]          Height:       71.0 in Accession #:    [id number]         Weight:       220.0 lb Date of Birth:  [birth year]          BSA:          2.196 m Patient Age:    71 years           BP:           102/65 mmHg Patient Gender: M                  HR:           91 bpm. Exam Location:  [facility] Procedure: 2D Echo, Cardiac Doppler and Color Doppler Indications:     Dyspnea R06.00                  Chest pain R07.9                  Elevated troponin  History:         Patient has no prior history of Echocardiogram examinations.                  Arrythmias:Atrial Fibrillation; Risk Factors:Diabetes. ESRD.  Sonographer:     Sherrie Sport RDCS (AE) Referring Phys:  [id number] AMY N COX Diagnosing Phys: Ida Rogue MD  Sonographer Comments: Suboptimal apical window. IMPRESSIONS  1. Left ventricular ejection fraction, by estimation, is 60 to 65%. The left ventricle has normal function. The left ventricle has no  regional wall motion abnormalities. Left ventricular diastolic parameters are indeterminate.  2. Right ventricular systolic function is normal. The right ventricular size is normal. Tricuspid regurgitation signal is inadequate for assessing PA pressure.  3. Left atrial size was moderately dilated.  4. Right atrial size was moderately dilated.  5. Tricuspid valve regurgitation is mild to moderate.  6. The mitral valve is normal in structure. Mild mitral valve regurgitation.  7. Rhythym is atrial fibrillation FINDINGS  Left Ventricle: Left ventricular ejection fraction, by estimation, is 60 to 65%. The left ventricle has normal function. The left ventricle has no regional wall motion abnormalities. The left ventricular internal cavity size was normal in size. There is  no left ventricular hypertrophy. Left ventricular diastolic parameters are indeterminate. Right Ventricle: The right ventricular size is normal. No increase in right ventricular wall thickness. Right ventricular systolic function is normal. Tricuspid regurgitation signal is inadequate for assessing PA pressure. Left Atrium: Left atrial size was moderately dilated. Right Atrium: Right atrial size was moderately dilated. Pericardium: Trivial pericardial effusion is present. Mitral Valve: The mitral valve is normal in structure. Mild mitral annular calcification. Mild mitral valve regurgitation. No evidence of mitral valve stenosis. Tricuspid Valve: The tricuspid valve is normal in structure. Tricuspid valve regurgitation is mild to moderate. No evidence of tricuspid stenosis. Aortic Valve: The aortic valve is normal in structure. Aortic valve regurgitation is not visualized. No aortic stenosis is present. Aortic valve mean gradient measures 1.0 mmHg. Aortic valve peak gradient measures 1.9 mmHg. Aortic valve area, by VTI measures 2.73 cm. Pulmonic Valve: The pulmonic valve was normal in structure. Pulmonic valve regurgitation is trivial. No evidence of  pulmonic stenosis.  Aorta: The aortic root is normal in size and structure. Venous: The inferior vena cava is normal in size with greater than 50% respiratory variability, suggesting right atrial pressure of 3 mmHg. IAS/Shunts: No atrial level shunt detected by color flow Doppler.  LEFT VENTRICLE PLAX 2D LVIDd:         4.71 cm  Diastology LVIDs:         2.53 cm  LV e' medial:    7.72 cm/s LV PW:         1.43 cm  LV E/e' medial:  15.4 LV IVS:        1.31 cm  LV e' lateral:   13.40 cm/s LVOT diam:     2.00 cm  LV E/e' lateral: 8.9 LV SV:         31 LV SV Index:   14 LVOT Area:     3.14 cm  RIGHT VENTRICLE RV S prime:     10.80 cm/s TAPSE (M-mode): 3.5 cm LEFT ATRIUM              Index       RIGHT ATRIUM           Index LA diam:        4.70 cm  2.14 cm/m  RA Area:     24.30 cm LA Vol (A2C):   84.1 ml  38.30 ml/m RA Volume:   68.70 ml  31.29 ml/m LA Vol (A4C):   113.0 ml 51.46 ml/m LA Biplane Vol: 101.0 ml 46.00 ml/m  AORTIC VALVE                   PULMONIC VALVE AV Area (Vmax):    2.23 cm    PV Vmax:        0.50 m/s AV Area (Vmean):   2.37 cm    PV Peak grad:   1.0 mmHg AV Area (VTI):     2.73 cm    RVOT Peak grad: 4 mmHg AV Vmax:           68.90 cm/s AV Vmean:          48.500 cm/s AV VTI:            0.115 m AV Peak Grad:      1.9 mmHg AV Mean Grad:      1.0 mmHg LVOT Vmax:         49.00 cm/s LVOT Vmean:        36.600 cm/s LVOT VTI:          0.100 m LVOT/AV VTI ratio: 0.87  AORTA Ao Root diam: 3.67 cm MITRAL VALVE MV Area (PHT): 6.27 cm     SHUNTS MV Decel Time: 121 msec     Systemic VTI:  0.10 m MV E velocity: 119.00 cm/s  Systemic Diam: 2.00 cm Ida Rogue MD Electronically signed by Ida Rogue MD Signature Date/Time: 09/21/2020/3:49:43 PM    Final         Scheduled Meds:  aspirin  81 mg Oral Daily   atorvastatin  40 mg Oral QHS   carvedilol  3.125 mg Oral BID WC   Chlorhexidine Gluconate Cloth  6 each Topical Q0600   diclofenac Sodium  2 g Topical BID   midodrine  10 mg Oral Q T,Th,Sa-HD    midodrine  5 mg Oral Daily   mupirocin ointment  1 application Nasal BID   pregabalin  75 mg Oral QHS   rOPINIRole  1 mg Oral  QHS   sevelamer carbonate  1,600 mg Oral BID WC   sevelamer carbonate  800 mg Oral Once per day on Sun Mon Wed Fri   sodium chloride flush  3 mL Intravenous Q12H   Continuous Infusions:  sodium chloride     sodium chloride 10 mL/hr at 09/22/20 [id number]   sodium chloride     sodium chloride     heparin 1,000 Units/hr (09/21/20 1916)     LOS: 1 day    Time spent: 42  mins    Wyvonnia Dusky, MD [facility] Pager [phone]  If 7PM-7AM, please contact night-coverage 09/22/2020, 8:00 AM

## 2020-09-22 NOTE — Progress Notes (Signed)
Mobility Specialist - Progress Note   09/22/20 1052  Mobility  Activity Contraindicated/medical hold  Mobility performed by Mobility specialist    Per chart review, pt with elevated K+ of 5.3, contraindicating mobility. Will attempt session another date/time as medically appropriate.    Kathee Delton Mobility Specialist 09/22/20, 10:53 AM

## 2020-09-22 NOTE — Progress Notes (Signed)
Per shantell green, np may continue dialysis as long at map greater than 65.

## 2020-09-22 NOTE — Progress Notes (Signed)
Antonio green, np adjusted UF due to blood pressure.

## 2020-09-22 NOTE — Progress Notes (Addendum)
[facility]  ROUNDING NOTE   Subjective:   Antonio Phillips is a 75 y.o. male with a past medical history of hypertension, CAD, rt BKA, and ESRD on dialysis. He presented to the ED from his SNF for chest pain and shortness of breath.   Patient currently receives dialysis at [facility], supervised by Dr. Candiss Norse. We have been consulted to manage dialysis during this admission  Patient seen resting in bed Currently NPO for procedure No complaints at this time  Patient seen later arriving to dialysis   Objective:  Vital signs in last 24 hours:  Temp:  [97.5 F (36.4 C)-98.7 F (37.1 C)] 98.1 F (36.7 C) (07/19 1004) Pulse Rate:  [80-108] 80 (07/19 1330) Resp:  [11-19] 15 (07/19 1330) BP: (94-114)/(57-73) 94/57 (07/19 1330) SpO2:  [97 %-100 %] 100 % (07/19 1330) Weight:  [98.9 kg] 98.9 kg (07/19 0600)  Weight change: -0.891 kg Filed Weights   09/20/20 1704 09/22/20 0600  Weight: 99.8 kg 98.9 kg    Intake/Output: I/O last 3 completed shifts: In: 65.1 [I.V.:65.1] Out: 150 [Urine:150]   Intake/Output this shift:  No intake/output data recorded.  Physical Exam: General: NAD, resting in bed  Head: Normocephalic, atraumatic. Moist oral mucosal membranes  Eyes: Anicteric  Lungs:  Clear to auscultation, normal effort  Heart: Regular rate and rhythm  Abdomen:  Soft, nontender  Extremities:  trace peripheral edema.Rt BKA  Neurologic: Nonfocal, moving all four extremities  Skin: No lesions  Access: Lt AVF    Basic Metabolic Panel: Recent Labs  Lab 09/20/20 1712 09/21/20 0505 09/22/20 0055  NA 134* 135 133*  K 4.7 4.6 5.3*  CL 101 101 99  CO2 '23 24 22  '$ GLUCOSE 143* 111* 125*  BUN 44* 50* 54*  CREATININE 5.69* 6.45* 6.74*  CALCIUM 8.9 8.8* 8.4*     Liver Function Tests: No results for input(s): AST, ALT, ALKPHOS, BILITOT, PROT, ALBUMIN in the last 168 hours. No results for input(s): LIPASE, AMYLASE in the last 168 hours. No results for  input(s): AMMONIA in the last 168 hours.  CBC: Recent Labs  Lab 09/20/20 1712 09/21/20 0505 09/22/20 0055  WBC 5.2 4.6 4.0  HGB 9.2* 8.9* 8.3*  HCT 29.6* 29.0* 26.1*  MCV 96.4 96.0 96.3  PLT 103* 98* 96*     Cardiac Enzymes: No results for input(s): CKTOTAL, CKMB, CKMBINDEX, TROPONINI in the last 168 hours.  BNP: Invalid input(s): POCBNP  CBG: Recent Labs  Lab 09/20/20 1727  GLUCAP 145*     Microbiology: Results for orders placed or performed during the hospital encounter of 09/20/20  SARS CORONAVIRUS 2 (TAT 6-24 HRS) Nasopharyngeal Nasopharyngeal Swab     Status: None   Collection Time: 09/20/20  9:27 PM   Specimen: Nasopharyngeal Swab  Result Value Ref Range Status   SARS Coronavirus 2 NEGATIVE NEGATIVE Final    Comment: (NOTE) SARS-CoV-2 target nucleic acids are NOT DETECTED.  The SARS-CoV-2 RNA is generally detectable in upper and lower respiratory specimens during the acute phase of infection. Negative results do not preclude SARS-CoV-2 infection, do not rule out co-infections with other pathogens, and should not be used as the sole basis for treatment or other patient management decisions. Negative results must be combined with clinical observations, patient history, and epidemiological information. The expected result is Negative.  Fact Sheet for Patients: SugarRoll.be  Fact Sheet for Healthcare Providers: https://www.woods-mathews.com/  This test is not yet approved or cleared by the [place] FDA and  has been authorized for detection and/or diagnosis of SARS-CoV-2 by FDA under an Emergency Use Authorization (EUA). This EUA will remain  in effect (meaning this test can be used) for the duration of the COVID-19 declaration under Se ction 564(b)(1) of the Act, 21 U.S.C. section 360bbb-3(b)(1), unless the authorization is terminated or revoked sooner.  Performed at [facility], [place] [address], [place], [place] [address]   MRSA Next Gen by PCR, Nasal     Status: Abnormal   Collection Time: 09/21/20  1:20 PM   Specimen: Nasal Mucosa; Nasal Swab  Result Value Ref Range Status   MRSA by PCR Next Gen DETECTED (A) NOT DETECTED Final    Comment: CRITICAL RESULT CALLED TO, READ BACK BY AND VERIFIED WITH: Arnetha Courser [id number] 09/21/20 GM (NOTE) The GeneXpert MRSA Assay (FDA approved for NASAL specimens only), is one component of a comprehensive MRSA colonization surveillance program. It is not intended to diagnose MRSA infection nor to guide or monitor treatment for MRSA infections. Test performance is not FDA approved in patients less than 36 years old. Performed at [facility], [address] [place], [place] [address]     Coagulation Studies: No results for input(s): LABPROT, INR in the last 72 hours.  Urinalysis: No results for input(s): COLORURINE, LABSPEC, PHURINE, GLUCOSEU, HGBUR, BILIRUBINUR, KETONESUR, PROTEINUR, UROBILINOGEN, NITRITE, LEUKOCYTESUR in the last 72 hours.  Invalid input(s): APPERANCEUR    Imaging: DG Chest 2 View  Result Date: 09/20/2020 CLINICAL DATA:  Chest pain and shortness of breath. EXAM: CHEST - 2 VIEW COMPARISON:  Radiograph 09/09/2019 FINDINGS: Chronic cardiomegaly. This is unchanged from prior exam. Unchanged mediastinal contours with aortic atherosclerosis. No confluent airspace disease. There is mild peribronchial thickening. Trace fluid in the fissures. No large subpulmonic effusion. No pneumothorax. No acute osseous abnormalities are seen IMPRESSION: Chronic cardiomegaly. Mild peribronchial thickening suspicious for pulmonary edema. Minimal fluid in the fissures. Electronically Signed   By: Keith Rake M.D.   On: 09/20/2020 17:37   CARDIAC CATHETERIZATION  Result Date: 09/22/2020 Formatting of this result is different from the original.   Ost LAD to Prox LAD lesion is 50% stenosed.   2nd Mrg lesion is 99% stenosed.   A  drug-eluting stent was successfully placed using a STENT RESOLUTE ONYX 2.5X15.   Post intervention, there is a 30% residual stenosis. 1.  Severe single-vessel coronary artery disease with 99% subtotal stenosis of the second obtuse marginal branch, treated with a 2.5 x 15 mm resolute Onyx DES.  30% residual stenosis at the case completion due to resistant/calcified lesion type even with high-pressure noncompliant balloon angioplasty. 2.  Mild to moderate nonobstructive proximal LAD stenosis 3.  Mild nonobstructive RCA stenosis Recommendations: Aggressive medical therapy, as long as no bleeding complications arise, would resume apixaban tomorrow, aspirin 81 mg daily x30 days, and clopidogrel 75 mg daily x minimum 6 months.   CT ANGIO CHEST AORTA W/CM & OR WO/CM  Result Date: 09/20/2020 CLINICAL DATA:  Chest pain short of breath EXAM: CT ANGIOGRAPHY CHEST WITH CONTRAST TECHNIQUE: Multidetector CT imaging of the chest was performed using the standard protocol during bolus administration of intravenous contrast. Multiplanar CT image reconstructions and MIPs were obtained to evaluate the vascular anatomy. CONTRAST:  62m OMNIPAQUE IOHEXOL 350 MG/ML SOLN COMPARISON:  Chest x-ray 09/20/2020 FINDINGS: Cardiovascular: Non contrasted images of the chest demonstrate no acute intramural hematoma. Moderate aortic atherosclerosis. Negative for aneurysm or aortic dissection. Coronary vascular calcifications. Cardiomegaly with small pericardial effusion. No gross  central filling defects within the pulmonary arteries. Mediastinum/Nodes: Midline trachea. Status post right thyroidectomy. Low right paratracheal node measuring 11 mm. Esophagus within normal limits. Lungs/Pleura: Lungs are clear. No pleural effusion or pneumothorax. Upper Abdomen: Gallstones. Questionable mild gallbladder wall thickening or edema. Atrophic appearing left kidney with multiple cysts. Musculoskeletal: Osteophytosis of the spine with large beak like  osteophyte at T10-T11. Review of the MIP images confirms the above findings. IMPRESSION: 1. Negative for acute aortic aneurysm or dissection. No gross acute central embolus seen. 2. Cardiomegaly with small pericardial effusion 3. Gallstones. Possible mild gallbladder wall thickening or edema, suggest correlation with ultrasound Aortic Atherosclerosis (ICD10-I70.0). Electronically Signed   By: Donavan Foil M.D.   On: 09/20/2020 18:36   ECHOCARDIOGRAM COMPLETE  Result Date: 09/21/2020    ECHOCARDIOGRAM REPORT   Patient Name:   ADISA ORILEY [facility] Date of Exam: 09/21/2020 Medical Rec #:  [id number]          Height:       71.0 in Accession #:    [id number]         Weight:       220.0 lb Date of Birth:  06-01-1945          BSA:          2.196 m Patient Age:    28 years           BP:           102/65 mmHg Patient Gender: M                  HR:           91 bpm. Exam Location:  [facility] Procedure: 2D Echo, Cardiac Doppler and Color Doppler Indications:     Dyspnea R06.00                  Chest pain R07.9                  Elevated troponin  History:         Patient has no prior history of Echocardiogram examinations.                  Arrythmias:Atrial Fibrillation; Risk Factors:Diabetes. ESRD.  Sonographer:     Sherrie Sport RDCS (AE) Referring Phys:  [id number] AMY N COX Diagnosing Phys: Ida Rogue MD  Sonographer Comments: Suboptimal apical window. IMPRESSIONS  1. Left ventricular ejection fraction, by estimation, is 60 to 65%. The left ventricle has normal function. The left ventricle has no regional wall motion abnormalities. Left ventricular diastolic parameters are indeterminate.  2. Right ventricular systolic function is normal. The right ventricular size is normal. Tricuspid regurgitation signal is inadequate for assessing PA pressure.  3. Left atrial size was moderately dilated.  4. Right atrial size was moderately dilated.  5. Tricuspid valve regurgitation is mild to moderate.  6. The mitral valve is normal in  structure. Mild mitral valve regurgitation.  7. Rhythym is atrial fibrillation FINDINGS  Left Ventricle: Left ventricular ejection fraction, by estimation, is 60 to 65%. The left ventricle has normal function. The left ventricle has no regional wall motion abnormalities. The left ventricular internal cavity size was normal in size. There is  no left ventricular hypertrophy. Left ventricular diastolic parameters are indeterminate. Right Ventricle: The right ventricular size is normal. No increase in right ventricular wall thickness. Right ventricular systolic function is normal. Tricuspid regurgitation signal is inadequate for assessing PA pressure. Left Atrium: Left atrial size  was moderately dilated. Right Atrium: Right atrial size was moderately dilated. Pericardium: Trivial pericardial effusion is present. Mitral Valve: The mitral valve is normal in structure. Mild mitral annular calcification. Mild mitral valve regurgitation. No evidence of mitral valve stenosis. Tricuspid Valve: The tricuspid valve is normal in structure. Tricuspid valve regurgitation is mild to moderate. No evidence of tricuspid stenosis. Aortic Valve: The aortic valve is normal in structure. Aortic valve regurgitation is not visualized. No aortic stenosis is present. Aortic valve mean gradient measures 1.0 mmHg. Aortic valve peak gradient measures 1.9 mmHg. Aortic valve area, by VTI measures 2.73 cm. Pulmonic Valve: The pulmonic valve was normal in structure. Pulmonic valve regurgitation is trivial. No evidence of pulmonic stenosis. Aorta: The aortic root is normal in size and structure. Venous: The inferior vena cava is normal in size with greater than 50% respiratory variability, suggesting right atrial pressure of 3 mmHg. IAS/Shunts: No atrial level shunt detected by color flow Doppler.  LEFT VENTRICLE PLAX 2D LVIDd:         4.71 cm  Diastology LVIDs:         2.53 cm  LV e' medial:    7.72 cm/s LV PW:         1.43 cm  LV E/e' medial:   15.4 LV IVS:        1.31 cm  LV e' lateral:   13.40 cm/s LVOT diam:     2.00 cm  LV E/e' lateral: 8.9 LV SV:         31 LV SV Index:   14 LVOT Area:     3.14 cm  RIGHT VENTRICLE RV S prime:     10.80 cm/s TAPSE (M-mode): 3.5 cm LEFT ATRIUM              Index       RIGHT ATRIUM           Index LA diam:        4.70 cm  2.14 cm/m  RA Area:     24.30 cm LA Vol (A2C):   84.1 ml  38.30 ml/m RA Volume:   68.70 ml  31.29 ml/m LA Vol (A4C):   113.0 ml 51.46 ml/m LA Biplane Vol: 101.0 ml 46.00 ml/m  AORTIC VALVE                   PULMONIC VALVE AV Area (Vmax):    2.23 cm    PV Vmax:        0.50 m/s AV Area (Vmean):   2.37 cm    PV Peak grad:   1.0 mmHg AV Area (VTI):     2.73 cm    RVOT Peak grad: 4 mmHg AV Vmax:           68.90 cm/s AV Vmean:          48.500 cm/s AV VTI:            0.115 m AV Peak Grad:      1.9 mmHg AV Mean Grad:      1.0 mmHg LVOT Vmax:         49.00 cm/s LVOT Vmean:        36.600 cm/s LVOT VTI:          0.100 m LVOT/AV VTI ratio: 0.87  AORTA Ao Root diam: 3.67 cm MITRAL VALVE MV Area (PHT): 6.27 cm     SHUNTS MV Decel Time: 121 msec     Systemic VTI:  0.10  m MV E velocity: 119.00 cm/s  Systemic Diam: 2.00 cm Ida Rogue MD Electronically signed by Ida Rogue MD Signature Date/Time: 09/21/2020/3:49:43 PM    Final      Medications:    sodium chloride     sodium chloride 10 mL/hr at 09/22/20 0508   [MAR Hold] sodium chloride     [MAR Hold] sodium chloride     sodium chloride      alum & mag hydroxide-simeth       [START ON 09/23/2020] apixaban  5 mg Oral BID   [MAR Hold] aspirin  81 mg Oral Daily   [MAR Hold] atorvastatin  40 mg Oral QHS   [MAR Hold] carvedilol  3.125 mg Oral BID WC   [MAR Hold] Chlorhexidine Gluconate Cloth  6 each Topical Q0600   [START ON 09/23/2020] clopidogrel  75 mg Oral Q breakfast   [MAR Hold] diclofenac Sodium  2 g Topical BID   famotidine       [MAR Hold] midodrine  10 mg Oral Q T,Th,Sa-HD   [MAR Hold] midodrine  5 mg Oral Daily   [MAR Hold]  mupirocin ointment  1 application Nasal BID   [MAR Hold] pregabalin  75 mg Oral QHS   [MAR Hold] rOPINIRole  1 mg Oral QHS   [MAR Hold] sevelamer carbonate  1,600 mg Oral BID WC   [MAR Hold] sevelamer carbonate  800 mg Oral Once per day on Sun Mon Wed Fri   [facility] Hold] sodium chloride flush  3 mL Intravenous Q12H   sodium chloride flush  3 mL Intravenous Q12H   sodium chloride, [MAR Hold] sodium chloride, [MAR Hold] sodium chloride, sodium chloride, [MAR Hold] acetaminophen **OR** [MAR Hold] acetaminophen, [MAR Hold] alteplase, [MAR Hold] heparin, hydrALAZINE, [MAR Hold] HYDROcodone-acetaminophen, labetalol, [MAR Hold] lidocaine (PF), [MAR Hold] lidocaine-prilocaine, [MAR Hold]  morphine injection, [MAR Hold] nitroGLYCERIN, [MAR Hold] ondansetron **OR** [MAR Hold] ondansetron (ZOFRAN) IV, oxyCODONE, [MAR Hold] pentafluoroprop-tetrafluoroeth, sodium chloride flush, sodium chloride flush  Assessment/ Plan:  Antonio Phillips is a 75 y.o.  male with a past medical history of hypertension, CAD, rt BKA, and ESRD on dialysis. He presented to the ED from his SNF for chest pain and shortness of breath.   [facility]/TTS/Lt AVF  Anemia of chronic kidney disease   Lab Results  Component Value Date   HGB 8.3 (L) 09/22/2020   Hgb below target Will monitor   2. End stage renal disease on dialysis Will maintain outpatient schedule during this admission Scheduled for dialysis after procedure BP soft during dialysis, will order Midodrine '10mg'$  once during treatment. Have decreased UF to 1L Next treatment scheduled for Thursday  3. Secondary Hyperparathyroidism:   Lab Results  Component Value Date   CALCIUM 8.4 (L) 09/22/2020   PHOS 7.7 (H) 01/14/2020  Phosphorus and calcium not at goal Renvela with meals      LOS: 1 Jacqulyne Gladue 7/19/20221:53 PM

## 2020-09-22 NOTE — Plan of Care (Signed)

## 2020-09-22 NOTE — NC FL2 (Signed)
[place] LEVEL OF CARE SCREENING TOOL     IDENTIFICATION  Patient Name: Antonio Phillips Birthdate: Jul 18, 1945 Sex: male Admission Date (Current Location): 09/20/2020  [facility] and [place] Number:  Engineering geologist and Address:  [facility], [address], [place], [place] [address]      Provider Number: [id number]  Attending Physician Name and Address:  Wyvonnia Dusky, MD  Relative Name and Phone Number:  NA    Current Level of Care: Hospital Recommended Level of Care: [place] Prior Approval Number:    Date Approved/Denied:   PASRR Number: [id number]  Discharge Plan: SNF    Current Diagnoses: Patient Active Problem List   Diagnosis Date Noted   Shortness of breath 09/20/2020   NSTEMI (non-ST elevated myocardial infarction) ([place]) 09/20/2020   Fungal skin infection    Atrial fibrillation, chronic (HCC)    Acquired thrombophilia ([place])    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[place])    Anemia of chronic disease    Thrombocytopenia ([place])    Sacral wound 01/13/2020   Pressure injury of skin 01/13/2020   Generalized weakness 01/13/2020   ESRD on dialysis ([place]) 04/25/2018    Orientation RESPIRATION BLADDER Height & Weight     Self, Time, Situation, Place  Normal Continent Weight: 218 lb 0.6 oz (98.9 kg) Height:  '5\' 11"'$  (180.3 cm)  BEHAVIORAL SYMPTOMS/MOOD NEUROLOGICAL BOWEL NUTRITION STATUS      Continent Diet (see discharge summary)  AMBULATORY STATUS COMMUNICATION OF NEEDS Skin   Extensive Assist Verbally Other (Comment) (pressure injury left buttocks and left thigh stage 2)                       Personal Care Assistance Level of Assistance  Bathing, Feeding, Total care, Dressing Bathing Assistance: Maximum assistance Feeding assistance: Maximum assistance Dressing Assistance: Maximum assistance Total Care Assistance: Maximum assistance   Functional Limitations  Info  Sight, Hearing, Speech Sight Info: Adequate Hearing Info: Adequate Speech Info: Adequate    SPECIAL CARE FACTORS FREQUENCY                       Contractures Contractures Info: Not present    Additional Factors Info  Code Status, Allergies Code Status Info: Full Allergies Info: No [place] Allergies           Current Medications (09/22/2020):  This is the current hospital active medication list Current Facility-Administered Medications  Medication Dose Route Frequency Provider Last Rate Last Admin   0.9 %  sodium chloride infusion  100 mL Intravenous PRN Breeze, Shantelle, NP       0.9 %  sodium chloride infusion  100 mL Intravenous PRN Gwyneth Revels, Shantelle, NP       0.9 %  sodium chloride infusion  250 mL Intravenous PRN Sherren Mocha, MD       acetaminophen (TYLENOL) tablet 1,000 mg  1,000 mg Oral Q6H PRN Cox, Amy N, DO   1,000 mg at 09/22/20 0701   Or   acetaminophen (TYLENOL) suppository 650 mg  650 mg Rectal Q6H PRN Cox, Amy N, DO       alteplase (CATHFLO ACTIVASE) injection 2 mg  2 mg Intracatheter Once PRN Colon Flattery, NP       alum & mag hydroxide-simeth (MAALOX/MYLANTA) 200-200-20 MG/5ML suspension            [START ON 09/23/2020] apixaban (ELIQUIS) tablet 5 mg  5 mg Oral BID Sherren Mocha, MD       aspirin chewable tablet 81 mg  81 mg Oral Daily Theora Gianotti, NP   81 mg at 09/22/20 0842   atorvastatin (LIPITOR) tablet 40 mg  40 mg Oral QHS Cox, Amy N, DO   40 mg at 09/21/20 2038   carvedilol (COREG) tablet 3.125 mg  3.125 mg Oral BID WC Cox, Amy N, DO   3.125 mg at 09/21/20 1735   Chlorhexidine Gluconate Cloth 2 % PADS 6 each  6 each Topical Q0600 Wyvonnia Dusky, MD   6 each at 09/22/20 0513   [START ON 09/23/2020] clopidogrel (PLAVIX) tablet 75 mg  75 mg Oral Q breakfast Sherren Mocha, MD       diclofenac Sodium (VOLTAREN) 1 % topical gel 2 g  2 g Topical BID Wyvonnia Dusky, MD   2 g at 09/22/20 0839   famotidine (PEPCID) 20 MG  tablet            heparin injection 1,000 Units  1,000 Units Dialysis PRN Colon Flattery, NP       hydrALAZINE (APRESOLINE) injection 10 mg  10 mg Intravenous Q20 Min PRN Sherren Mocha, MD       HYDROcodone-acetaminophen (NORCO/VICODIN) 5-325 MG per tablet 1 tablet  1 tablet Oral Q6H PRN Wyvonnia Dusky, MD   1 tablet at 09/22/20 0437   labetalol (NORMODYNE) injection 10 mg  10 mg Intravenous Q10 min PRN Sherren Mocha, MD       lidocaine (PF) (XYLOCAINE) 1 % injection 5 mL  5 mL Intradermal PRN Colon Flattery, NP       lidocaine-prilocaine (EMLA) cream 1 application  1 application Topical PRN Colon Flattery, NP       midodrine (PROAMATINE) tablet 10 mg  10 mg Oral Q T,Th,Sa-HD Cox, Amy N, DO   10 mg at 09/22/20 0843   midodrine (PROAMATINE) tablet 5 mg  5 mg Oral Daily Cox, Amy N, DO   5 mg at 09/22/20 0507   morphine 4 MG/ML injection 4 mg  4 mg Intravenous Q4H PRN Cox, Amy N, DO       mupirocin ointment (BACTROBAN) 2 % 1 application  1 application Nasal BID Wyvonnia Dusky, MD   1 application at [id number]   nitroGLYCERIN (NITROGLYN) 2 % ointment 1 inch  1 inch Topical Q6H PRN Cox, Amy N, DO       ondansetron (ZOFRAN) tablet 4 mg  4 mg Oral Q6H PRN Cox, Amy N, DO       Or   ondansetron (ZOFRAN) injection 4 mg  4 mg Intravenous Q6H PRN Cox, Amy N, DO   4 mg at 09/21/20 [id number]   oxyCODONE (Oxy IR/ROXICODONE) immediate release tablet 5-10 mg  5-10 mg Oral Q4H PRN Sherren Mocha, MD       pentafluoroprop-tetrafluoroeth (GEBAUERS) aerosol 1 application  1 application Topical PRN Colon Flattery, NP       pregabalin (LYRICA) capsule 75 mg  75 mg Oral QHS Cox, Amy N, DO   75 mg at 09/21/20 2038   rOPINIRole (REQUIP) tablet 1 mg  1 mg Oral QHS Cox, Amy N, DO   1 mg at 09/21/20 2038   sevelamer carbonate (RENVELA) tablet 1,600 mg  1,600 mg Oral BID WC Cox, Amy N, DO   1,600 mg at 09/21/20 1735   sevelamer carbonate (RENVELA) tablet 800 mg  800 mg Oral Once per day on Sun Mon Wed  Fri Cox, Amy N, DO       sodium chloride flush (NS) 0.9 % injection 3 mL  3 mL Intravenous Q12H Theora Gianotti, NP   3 mL at 09/22/20 0900   sodium chloride flush (NS) 0.9 % injection 3 mL  3 mL Intravenous Q12H Sherren Mocha, MD       sodium chloride flush (NS) 0.9 % injection 3 mL  3 mL Intravenous PRN Sherren Mocha, MD         Discharge Medications: Please see discharge summary for a list of discharge medications.  Relevant Imaging Results:  Relevant Lab Results:   Additional Information SSN: [id number]  Alberteen Sam, LCSW

## 2020-09-23 ENCOUNTER — Encounter: Payer: Self-pay | Admitting: Cardiovascular Disease

## 2020-09-23 DIAGNOSIS — I2510 Atherosclerotic heart disease of native coronary artery without angina pectoris: Secondary | ICD-10-CM | POA: Diagnosis present

## 2020-09-23 DIAGNOSIS — Z20822 Contact with and (suspected) exposure to covid-19: Secondary | ICD-10-CM | POA: Diagnosis present

## 2020-09-23 DIAGNOSIS — Z6830 Body mass index (BMI) 30.0-30.9, adult: Secondary | ICD-10-CM | POA: Diagnosis not present

## 2020-09-23 DIAGNOSIS — R079 Chest pain, unspecified: Secondary | ICD-10-CM | POA: Diagnosis not present

## 2020-09-23 DIAGNOSIS — Z9049 Acquired absence of other specified parts of digestive tract: Secondary | ICD-10-CM | POA: Diagnosis not present

## 2020-09-23 DIAGNOSIS — E785 Hyperlipidemia, unspecified: Secondary | ICD-10-CM | POA: Diagnosis present

## 2020-09-23 DIAGNOSIS — T148XXA Other injury of unspecified body region, initial encounter: Secondary | ICD-10-CM | POA: Diagnosis not present

## 2020-09-23 DIAGNOSIS — A4151 Sepsis due to Escherichia coli [E. coli]: Secondary | ICD-10-CM | POA: Diagnosis present

## 2020-09-23 DIAGNOSIS — G2581 Restless legs syndrome: Secondary | ICD-10-CM | POA: Diagnosis present

## 2020-09-23 DIAGNOSIS — D631 Anemia in chronic kidney disease: Secondary | ICD-10-CM | POA: Diagnosis present

## 2020-09-23 DIAGNOSIS — L89322 Pressure ulcer of left buttock, stage 2: Secondary | ICD-10-CM | POA: Diagnosis present

## 2020-09-23 DIAGNOSIS — D638 Anemia in other chronic diseases classified elsewhere: Secondary | ICD-10-CM | POA: Diagnosis not present

## 2020-09-23 DIAGNOSIS — Z96643 Presence of artificial hip joint, bilateral: Secondary | ICD-10-CM | POA: Diagnosis present

## 2020-09-23 DIAGNOSIS — E1151 Type 2 diabetes mellitus with diabetic peripheral angiopathy without gangrene: Secondary | ICD-10-CM | POA: Diagnosis present

## 2020-09-23 DIAGNOSIS — I482 Chronic atrial fibrillation, unspecified: Secondary | ICD-10-CM | POA: Diagnosis not present

## 2020-09-23 DIAGNOSIS — I214 Non-ST elevation (NSTEMI) myocardial infarction: Secondary | ICD-10-CM | POA: Diagnosis not present

## 2020-09-23 DIAGNOSIS — I739 Peripheral vascular disease, unspecified: Secondary | ICD-10-CM

## 2020-09-23 DIAGNOSIS — E1122 Type 2 diabetes mellitus with diabetic chronic kidney disease: Secondary | ICD-10-CM | POA: Diagnosis present

## 2020-09-23 DIAGNOSIS — I248 Other forms of acute ischemic heart disease: Secondary | ICD-10-CM | POA: Diagnosis not present

## 2020-09-23 DIAGNOSIS — I959 Hypotension, unspecified: Secondary | ICD-10-CM | POA: Diagnosis not present

## 2020-09-23 DIAGNOSIS — R7881 Bacteremia: Secondary | ICD-10-CM | POA: Diagnosis not present

## 2020-09-23 DIAGNOSIS — L89892 Pressure ulcer of other site, stage 2: Secondary | ICD-10-CM | POA: Diagnosis present

## 2020-09-23 DIAGNOSIS — N186 End stage renal disease: Secondary | ICD-10-CM | POA: Diagnosis present

## 2020-09-23 DIAGNOSIS — Z89612 Acquired absence of left leg above knee: Secondary | ICD-10-CM | POA: Diagnosis not present

## 2020-09-23 DIAGNOSIS — E669 Obesity, unspecified: Secondary | ICD-10-CM | POA: Diagnosis present

## 2020-09-23 DIAGNOSIS — I9589 Other hypotension: Secondary | ICD-10-CM | POA: Diagnosis present

## 2020-09-23 DIAGNOSIS — E875 Hyperkalemia: Secondary | ICD-10-CM | POA: Diagnosis present

## 2020-09-23 DIAGNOSIS — N2581 Secondary hyperparathyroidism of renal origin: Secondary | ICD-10-CM | POA: Diagnosis present

## 2020-09-23 DIAGNOSIS — Z7401 Bed confinement status: Secondary | ICD-10-CM | POA: Diagnosis not present

## 2020-09-23 DIAGNOSIS — I252 Old myocardial infarction: Secondary | ICD-10-CM | POA: Diagnosis not present

## 2020-09-23 DIAGNOSIS — Z89511 Acquired absence of right leg below knee: Secondary | ICD-10-CM | POA: Diagnosis not present

## 2020-09-23 DIAGNOSIS — D696 Thrombocytopenia, unspecified: Secondary | ICD-10-CM

## 2020-09-23 DIAGNOSIS — M2550 Pain in unspecified joint: Secondary | ICD-10-CM | POA: Diagnosis not present

## 2020-09-23 DIAGNOSIS — I4891 Unspecified atrial fibrillation: Secondary | ICD-10-CM | POA: Diagnosis not present

## 2020-09-23 DIAGNOSIS — N400 Enlarged prostate without lower urinary tract symptoms: Secondary | ICD-10-CM | POA: Diagnosis present

## 2020-09-23 DIAGNOSIS — I4821 Permanent atrial fibrillation: Secondary | ICD-10-CM | POA: Diagnosis present

## 2020-09-23 DIAGNOSIS — Z992 Dependence on renal dialysis: Secondary | ICD-10-CM | POA: Diagnosis not present

## 2020-09-23 LAB — LIPID PANEL
Cholesterol: 58 mg/dL (ref 0–200)
HDL: 23 mg/dL — ABNORMAL LOW (ref >40)
LDL Cholesterol: 22 mg/dL (ref 0–99)
Total CHOL/HDL Ratio: 2.5 RATIO
Triglycerides: 63 mg/dL (ref <150)
VLDL: 13 mg/dL (ref 0–40)

## 2020-09-23 LAB — CBC
HCT: 27.3 % — ABNORMAL LOW (ref 39.0–52.0)
Hemoglobin: 8.6 g/dL — ABNORMAL LOW (ref 13.0–17.0)
MCH: 30.1 pg (ref 26.0–34.0)
MCHC: 31.5 g/dL (ref 30.0–36.0)
MCV: 95.5 fL (ref 80.0–100.0)
Platelets: 106 10*3/uL — ABNORMAL LOW (ref 150–400)
RBC: 2.86 MIL/uL — ABNORMAL LOW (ref 4.22–5.81)
RDW: 17.2 % — ABNORMAL HIGH (ref 11.5–15.5)
WBC: 5.3 10*3/uL (ref 4.0–10.5)
nRBC: 0 % (ref 0.0–0.2)

## 2020-09-23 LAB — BASIC METABOLIC PANEL
Anion gap: 12 (ref 5–15)
BUN: 34 mg/dL — ABNORMAL HIGH (ref 8–23)
CO2: 27 mmol/L (ref 22–32)
Calcium: 8.8 mg/dL — ABNORMAL LOW (ref 8.9–10.3)
Chloride: 95 mmol/L — ABNORMAL LOW (ref 98–111)
Creatinine, Ser: 4.84 mg/dL — ABNORMAL HIGH (ref 0.61–1.24)
GFR, Estimated: 12 mL/min — ABNORMAL LOW (ref >60)
Glucose, Bld: 116 mg/dL — ABNORMAL HIGH (ref 70–99)
Potassium: 4.9 mmol/L (ref 3.5–5.1)
Sodium: 134 mmol/L — ABNORMAL LOW (ref 135–145)

## 2020-09-23 MED ORDER — CLOPIDOGREL BISULFATE 75 MG PO TABS: 75.0000 mg | ORAL_TABLET | Freq: Every day | ORAL | 0 refills | Status: DC

## 2020-09-23 MED ORDER — HYDROCODONE-ACETAMINOPHEN 5-325 MG PO TABS: 1.0000 | ORAL_TABLET | Freq: Four times a day (QID) | ORAL | 0 refills | Status: DC | PRN

## 2020-09-23 MED ORDER — MUPIROCIN 2 % EX OINT: 1.0000 "application " | TOPICAL_OINTMENT | Freq: Two times a day (BID) | CUTANEOUS | 0 refills | Status: DC

## 2020-09-23 MED ORDER — ASPIRIN 81 MG PO CHEW: 81.0000 mg | CHEWABLE_TABLET | Freq: Every day | ORAL | 0 refills | Status: DC

## 2020-09-23 NOTE — Progress Notes (Signed)
Called and spoke to Nurse from [facility].  Gave report and sent AVS with transport service.

## 2020-09-23 NOTE — Plan of Care (Signed)

## 2020-09-23 NOTE — Discharge Summary (Signed)
[facility] NAME: Antonio Phillips    MR#:  [id number]  DATE OF BIRTH:  17-Feb-1946  DATE OF ADMISSION:  09/20/2020 ADMITTING PHYSICIAN: Wyvonnia Dusky, MD  DATE OF DISCHARGE: 09/23/2020  PRIMARY CARE PHYSICIAN: Marsh Dolly, MD    ADMISSION DIAGNOSIS:  NSTEMI (non-ST elevated myocardial infarction) ([place]) [I21.4]  DISCHARGE DIAGNOSIS:  Principal Problem:   Shortness of breath Active Problems:   ESRD on dialysis ([place])   Pressure injury of skin   Generalized weakness   Atrial fibrillation, chronic (HCC)   Acquired thrombophilia (HCC)   Anemia of chronic disease   Thrombocytopenia (HCC)   NSTEMI (non-ST elevated myocardial infarction) ([place])   SECONDARY DIAGNOSIS:   Past Medical History:  Diagnosis Date  . Demand ischemia ([place])    a. 11/2017 elev Trop in setting of AFib/SVT-->Echo [facility]): EF>55%. Mild LVH. Nl RV fxn.  . Diabetes ([place])    a. Pt states that he has no hx of diabetes--A1c 5.5 11/2018.  Marland Kitchen ESRD (end stage renal disease) ([place])    a. On HD since 2019.  Marland Kitchen Hyperlipidemia   . Hypotension    a. On midodrine.  . Morbid obesity ([place])   . PAD (peripheral artery disease) (HCC)    a. s/p L AKA and R BKA.  Marland Kitchen Permanent atrial fibrillation ([place])    a. Dx 2019. CHA2DS2VASc = 3-4-->Eliquis.    HOSPITAL COURSE:   NSTEMI.  Patient was treated medically with IV heparin drip aspirin and atorvastatin and Coreg.  Cardiac cath showed single-vessel coronary artery disease with 99% stenosis of the second marginal lesion.  A stent was placed with residual 30% stenosis.  Interventional cardiology recommended aspirin for another 29 days then can be discontinued.  Continue Plavix minimum of 6 months.  Patient placed back on Eliquis. End-stage renal disease on hemodialysis Tuesday Thursday Saturday.  Had hemodialysis after cardiac cath.  Next scheduled dialysis will be as outpatient on Thursday. Peripheral vascular disease status post  left AKA right BKA on atorvastatin and blood thinners. Chronic thrombocytopenia Anemia of chronic disease Chronic atrial fibrillation on Coreg and Eliquis Relative hypotension on midodrine to lift up blood pressure on low-dose beta-blocker for heart protection. Hyperlipidemia unspecified.  Will add on a lipid profile.  Continue atorvastatin. Restless leg syndrome on Requip Ventral hernia  DISCHARGE CONDITIONS:   Satisfactory  CONSULTS OBTAINED:  Treatment Team:  Wellington Hampshire, MD Murlean Iba, MD  DRUG ALLERGIES:  No Known Allergies  DISCHARGE MEDICATIONS:   Allergies as of 09/23/2020   No Known Allergies      Medication List     STOP taking these medications    clotrimazole 1 % cream Commonly known as: LOTRIMIN   collagenase ointment Commonly known as: SANTYL   H-Chlor 12 0.125 % Soln Generic drug: Sodium Hypochlorite       TAKE these medications    acetaminophen 325 MG tablet Commonly known as: TYLENOL Take 2 tablets (650 mg total) by mouth every 6 (six) hours as needed for mild pain or moderate pain.   apixaban 5 MG Tabs tablet Commonly known as: ELIQUIS Take 1 tablet (5 mg total) by mouth 2 (two) times daily.   aspirin 81 MG chewable tablet Chew 1 tablet (81 mg total) by mouth daily.   atorvastatin 40 MG tablet Commonly known as: LIPITOR Take 40 mg by mouth at bedtime.   carvedilol 3.125 MG tablet Commonly known as: COREG Take 1 tablet (3.125 mg total) by  mouth 2 (two) times daily with a meal.   clopidogrel 75 MG tablet Commonly known as: PLAVIX Take 1 tablet (75 mg total) by mouth daily with breakfast.   diclofenac Sodium 1 % Gel Commonly known as: VOLTAREN Apply 2 g topically See admin instructions. Apply 2 g to right stump once every morning   Enulose 10 GM/15ML Soln Generic drug: lactulose (encephalopathy) Take 10 g by mouth 2 (two) times daily as needed (mild constipation).   finasteride 5 MG tablet Commonly known as:  PROSCAR Take 5 mg by mouth daily.   HYDROcodone-acetaminophen 5-325 MG tablet Commonly known as: NORCO/VICODIN Take 1 tablet by mouth every 6 (six) hours as needed for severe pain.   loperamide 2 MG tablet Commonly known as: IMODIUM A-D Take 2 tablets (4 mg total) by mouth 4 (four) times daily as needed for diarrhea or loose stools.   loratadine 10 MG tablet Commonly known as: CLARITIN Take 10 mg by mouth See admin instructions. Take 10 mg by mouth once daily on Mondays, Wednesdays, Fridays, and Sundays   midodrine 5 MG tablet Commonly known as: PROAMATINE Take 5-10 mg by mouth See admin instructions. Take 5 mg by mouth once daily at 0600. Give 10 mg by mouth once daily at 1000 on Tuesdays, Thursdays, and Saturdays   mupirocin ointment 2 % Commonly known as: BACTROBAN Place 1 application into the nose 2 (two) times daily.   pregabalin 75 MG capsule Commonly known as: LYRICA Take 75 mg by mouth at bedtime.   Refresh Plus 0.5 % Soln Generic drug: Carboxymethylcellulose Sod PF Apply 1 drop to eye 3 (three) times daily as needed (dry eyes).   rOPINIRole 1 MG tablet Commonly known as: REQUIP Take 1 mg by mouth at bedtime.   sevelamer carbonate 800 MG tablet Commonly known as: RENVELA Take 1,600 mg by mouth See admin instructions. Take 1,600 mg by mouth twice daily with breakfast and dinner on Tuesdays, Thursdays, and Saturdays. Take 1,600 mg by mouth three times daily with meals on Mondays, Wednesdays, Fridays, and Sundays.   vitamin B-12 1000 MCG tablet Commonly known as: CYANOCOBALAMIN Take 1,000 mcg by mouth daily.         DISCHARGE INSTRUCTIONS:   Follow-up PMD 5 days Follow-up dialysis as scheduled Follow-up cardiology 1 week  If you experience worsening of your admission symptoms, develop shortness of breath, life threatening emergency, suicidal or homicidal thoughts you must seek medical attention immediately by calling 911 or calling your MD immediately  if  symptoms less severe.  You Must read complete instructions/literature along with all the possible adverse reactions/side effects for all the Medicines you take and that have been prescribed to you. Take any new Medicines after you have completely understood and accept all the possible adverse reactions/side effects.   Please note  You were cared for by a hospitalist during your hospital stay. If you have any questions about your discharge medications or the care you received while you were in the hospital after you are discharged, you can call the unit and asked to speak with the hospitalist on call if the hospitalist that took care of you is not available. Once you are discharged, your primary care physician will handle any further medical issues. Please note that NO REFILLS for any discharge medications will be authorized once you are discharged, as it is imperative that you return to your primary care physician (or establish a relationship with a primary care physician if you do not have one) for  your aftercare needs so that they can reassess your need for medications and monitor your lab values.    Today   CHIEF COMPLAINT:   Chief Complaint  Patient presents with  . Chest Pain    HISTORY OF PRESENT ILLNESS:  Brittany Pierrelouis  is a 75 y.o. male with a known history of end-stage renal disease presented with chest pain   VITAL SIGNS:  Blood pressure 104/79, pulse 79, temperature 98.1 F (36.7 C), resp. rate 18, height '5\' 11"'$  (1.803 m), weight 98.9 kg, SpO2 99 %.  PHYSICAL EXAMINATION:  GENERAL:  75 y.o.-year-old patient lying in the bed with no acute distress.  EYES: Pupils equal, round, reactive to light and accommodation. No scleral icterus. HEENT: Head atraumatic, normocephalic. Oropharynx and nasopharynx clear.  LUNGS: Normal breath sounds bilaterally, no wheezing, rales,rhonchi or crepitation. No use of accessory muscles of respiration.  CARDIOVASCULAR: S1, S2 normal. No  murmurs, rubs, or gallops.  ABDOMEN: Soft, non-tender, non-distended.   NEUROLOGIC: Cranial nerves II through XII are intact. Muscle strength 5/5 in all extremities. Sensation intact.  PSYCHIATRIC: The patient is alert and oriented x 3.  SKIN: No obvious rash, lesion, or ulcer.   DATA REVIEW:   CBC Recent Labs  Lab 09/23/20 0413  WBC 5.3  HGB 8.6*  HCT 27.3*  PLT 106*    Chemistries  Recent Labs  Lab 09/23/20 0413  NA 134*  K 4.9  CL 95*  CO2 27  GLUCOSE 116*  BUN 34*  CREATININE 4.84*  CALCIUM 8.8*   Microbiology Results  Results for orders placed or performed during the hospital encounter of 09/20/20  SARS CORONAVIRUS 2 (TAT 6-24 HRS) Nasopharyngeal Nasopharyngeal Swab     Status: None   Collection Time: 09/20/20  9:27 PM   Specimen: Nasopharyngeal Swab  Result Value Ref Range Status   SARS Coronavirus 2 NEGATIVE NEGATIVE Final    Comment: (NOTE) SARS-CoV-2 target nucleic acids are NOT DETECTED.  The SARS-CoV-2 RNA is generally detectable in upper and lower respiratory specimens during the acute phase of infection. Negative results do not preclude SARS-CoV-2 infection, do not rule out co-infections with other pathogens, and should not be used as the sole basis for treatment or other patient management decisions. Negative results must be combined with clinical observations, patient history, and epidemiological information. The expected result is Negative.  Fact Sheet for Patients: SugarRoll.be  Fact Sheet for Healthcare Providers: https://www.woods-mathews.com/  This test is not yet approved or cleared by the [place] FDA and  has been authorized for detection and/or diagnosis of SARS-CoV-2 by FDA under an Emergency Use Authorization (EUA). This EUA will remain  in effect (meaning this test can be used) for the duration of the COVID-19 declaration under Se ction 564(b)(1) of the Act, 21 U.S.C. section  360bbb-3(b)(1), unless the authorization is terminated or revoked sooner.  Performed at [facility], [place] [address], [place], [place] [address]   MRSA Next Gen by PCR, Nasal     Status: Abnormal   Collection Time: 09/21/20  1:20 PM   Specimen: Nasal Mucosa; Nasal Swab  Result Value Ref Range Status   MRSA by PCR Next Gen DETECTED (A) NOT DETECTED Final    Comment: CRITICAL RESULT CALLED TO, READ BACK BY AND VERIFIED WITH: Arnetha Courser [id number] 09/21/20 GM (NOTE) The GeneXpert MRSA Assay (FDA approved for NASAL specimens only), is one component of a comprehensive MRSA colonization surveillance program. It is not intended to diagnose MRSA infection nor to guide  or monitor treatment for MRSA infections. Test performance is not FDA approved in patients less than 67 years old. Performed at [facility], [address] [place], [place] [address]     RADIOLOGY:  CARDIAC CATHETERIZATION  Result Date: 09/22/2020 Formatting of this result is different from the original. .  Ost LAD to Prox LAD lesion is 50% stenosed. .  2nd Mrg lesion is 99% stenosed. .  A drug-eluting stent was successfully placed using a STENT RESOLUTE ONYX 2.5X15. Marland Kitchen  Post intervention, there is a 30% residual stenosis. 1.  Severe single-vessel coronary artery disease with 99% subtotal stenosis of the second obtuse marginal branch, treated with a 2.5 x 15 mm resolute Onyx DES.  30% residual stenosis at the case completion due to resistant/calcified lesion type even with high-pressure noncompliant balloon angioplasty. 2.  Mild to moderate nonobstructive proximal LAD stenosis 3.  Mild nonobstructive RCA stenosis Recommendations: Aggressive medical therapy, as long as no bleeding complications arise, would resume apixaban tomorrow, aspirin 81 mg daily x30 days, and clopidogrel 75 mg daily x minimum 6 months.   ECHOCARDIOGRAM COMPLETE  Result Date: 09/21/2020    ECHOCARDIOGRAM REPORT   Patient Name:   ADONUS KEAVENEY  [facility] Date of Exam: 09/21/2020 Medical Rec #:  [id number]          Height:       71.0 in Accession #:    [id number]         Weight:       220.0 lb Date of Birth:  09-09-1945          BSA:          2.196 m Patient Age:    75 years           BP:           102/65 mmHg Patient Gender: M                  HR:           91 bpm. Exam Location:  [facility] Procedure: 2D Echo, Cardiac Doppler and Color Doppler Indications:     Dyspnea R06.00                  Chest pain R07.9                  Elevated troponin  History:         Patient has no prior history of Echocardiogram examinations.                  Arrythmias:Atrial Fibrillation; Risk Factors:Diabetes. ESRD.  Sonographer:     Sherrie Sport RDCS (AE) Referring Phys:  [id number] AMY N COX Diagnosing Phys: Ida Rogue MD  Sonographer Comments: Suboptimal apical window. IMPRESSIONS  1. Left ventricular ejection fraction, by estimation, is 60 to 65%. The left ventricle has normal function. The left ventricle has no regional wall motion abnormalities. Left ventricular diastolic parameters are indeterminate.  2. Right ventricular systolic function is normal. The right ventricular size is normal. Tricuspid regurgitation signal is inadequate for assessing PA pressure.  3. Left atrial size was moderately dilated.  4. Right atrial size was moderately dilated.  5. Tricuspid valve regurgitation is mild to moderate.  6. The mitral valve is normal in structure. Mild mitral valve regurgitation.  7. Rhythym is atrial fibrillation FINDINGS  Left Ventricle: Left ventricular ejection fraction, by estimation, is 60 to 65%. The left ventricle has normal function. The left ventricle has no  regional wall motion abnormalities. The left ventricular internal cavity size was normal in size. There is  no left ventricular hypertrophy. Left ventricular diastolic parameters are indeterminate. Right Ventricle: The right ventricular size is normal. No increase in right ventricular wall thickness. Right  ventricular systolic function is normal. Tricuspid regurgitation signal is inadequate for assessing PA pressure. Left Atrium: Left atrial size was moderately dilated. Right Atrium: Right atrial size was moderately dilated. Pericardium: Trivial pericardial effusion is present. Mitral Valve: The mitral valve is normal in structure. Mild mitral annular calcification. Mild mitral valve regurgitation. No evidence of mitral valve stenosis. Tricuspid Valve: The tricuspid valve is normal in structure. Tricuspid valve regurgitation is mild to moderate. No evidence of tricuspid stenosis. Aortic Valve: The aortic valve is normal in structure. Aortic valve regurgitation is not visualized. No aortic stenosis is present. Aortic valve mean gradient measures 1.0 mmHg. Aortic valve peak gradient measures 1.9 mmHg. Aortic valve area, by VTI measures 2.73 cm. Pulmonic Valve: The pulmonic valve was normal in structure. Pulmonic valve regurgitation is trivial. No evidence of pulmonic stenosis. Aorta: The aortic root is normal in size and structure. Venous: The inferior vena cava is normal in size with greater than 50% respiratory variability, suggesting right atrial pressure of 3 mmHg. IAS/Shunts: No atrial level shunt detected by color flow Doppler.  LEFT VENTRICLE PLAX 2D LVIDd:         4.71 cm  Diastology LVIDs:         2.53 cm  LV e' medial:    7.72 cm/s LV PW:         1.43 cm  LV E/e' medial:  15.4 LV IVS:        1.31 cm  LV e' lateral:   13.40 cm/s LVOT diam:     2.00 cm  LV E/e' lateral: 8.9 LV SV:         31 LV SV Index:   14 LVOT Area:     3.14 cm  RIGHT VENTRICLE RV S prime:     10.80 cm/s TAPSE (M-mode): 3.5 cm LEFT ATRIUM              Index       RIGHT ATRIUM           Index LA diam:        4.70 cm  2.14 cm/m  RA Area:     24.30 cm LA Vol (A2C):   84.1 ml  38.30 ml/m RA Volume:   68.70 ml  31.29 ml/m LA Vol (A4C):   113.0 ml 51.46 ml/m LA Biplane Vol: 101.0 ml 46.00 ml/m  AORTIC VALVE                   PULMONIC VALVE  AV Area (Vmax):    2.23 cm    PV Vmax:        0.50 m/s AV Area (Vmean):   2.37 cm    PV Peak grad:   1.0 mmHg AV Area (VTI):     2.73 cm    RVOT Peak grad: 4 mmHg AV Vmax:           68.90 cm/s AV Vmean:          48.500 cm/s AV VTI:            0.115 m AV Peak Grad:      1.9 mmHg AV Mean Grad:      1.0 mmHg LVOT Vmax:  49.00 cm/s LVOT Vmean:        36.600 cm/s LVOT VTI:          0.100 m LVOT/AV VTI ratio: 0.87  AORTA Ao Root diam: 3.67 cm MITRAL VALVE MV Area (PHT): 6.27 cm     SHUNTS MV Decel Time: 121 msec     Systemic VTI:  0.10 m MV E velocity: 119.00 cm/s  Systemic Diam: 2.00 cm Ida Rogue MD Electronically signed by Ida Rogue MD Signature Date/Time: 09/21/2020/3:49:43 PM    Final       Management plans discussed with the patient, and he is in agreement.  CODE STATUS:     Code Status Orders  (From admission, onward)           Start     Ordered   09/20/20 1916  Full code  Continuous        09/20/20 1916           Code Status History     Date Active Date Inactive Code Status Order ID Comments User Context   01/13/2020 2324 01/17/2020 2320 Full Code [id number]  Criss Alvine, DO Inpatient   09/09/2019 1722 09/11/2019 0717 Full Code [id number]  Karie Kirks, DO ED   11/09/2018 0916 11/14/2018 2042 Full Code [id number]  Harrie Foreman, MD Inpatient   04/25/2018 0737 04/26/2018 0056 Full Code [id number]  Lance Coon, MD ED       TOTAL TIME TAKING CARE OF THIS PATIENT: 35 minutes.    Loletha Grayer M.D on 09/23/2020 at 9:13 AM   [facility]  CC: Primary care physician; Marsh Dolly, MD

## 2020-09-23 NOTE — Progress Notes (Signed)
Progress Note  Patient Name: Antonio Phillips Date of Encounter: 09/23/2020  Primary Cardiologist: Ida Rogue, MD  Subjective   No chest pain or sob.  Eager to go home.  Inpatient Medications    Scheduled Meds:  apixaban  5 mg Oral BID   aspirin  81 mg Oral Daily   atorvastatin  40 mg Oral QHS   carvedilol  3.125 mg Oral BID WC   Chlorhexidine Gluconate Cloth  6 each Topical Q0600   clopidogrel  75 mg Oral Q breakfast   diclofenac Sodium  2 g Topical BID   midodrine  10 mg Oral Q T,Th,Sa-HD   midodrine  5 mg Oral Daily   mupirocin ointment  1 application Nasal BID   pregabalin  75 mg Oral QHS   rOPINIRole  1 mg Oral QHS   sevelamer carbonate  1,600 mg Oral BID WC   sevelamer carbonate  800 mg Oral Once per day on Sun Mon Wed Fri   sodium chloride flush  3 mL Intravenous Q12H   sodium chloride flush  3 mL Intravenous Q12H   Continuous Infusions:  sodium chloride     PRN Meds: sodium chloride, acetaminophen **OR** acetaminophen, HYDROcodone-acetaminophen, morphine injection, ondansetron **OR** ondansetron (ZOFRAN) IV, oxyCODONE, sodium chloride flush   Vital Signs    Vitals:   09/22/20 1947 09/22/20 2317 09/23/20 0413 09/23/20 0814  BP: 107/63 113/69 108/61 104/79  Pulse: 89 86 99 79  Resp: '16 20 20 18  '$ Temp: 97.8 F (36.6 C) 99.8 F (37.7 C) 98.8 F (37.1 C) 98.1 F (36.7 C)  TempSrc: Oral  Oral   SpO2: 96% 96% 98% 99%  Weight:      Height:        Intake/Output Summary (Last 24 hours) at 09/23/2020 0950 Last data filed at 09/23/2020 0915 Gross per 24 hour  Intake 588.67 ml  Output 145 ml  Net 443.67 ml   Filed Weights   09/20/20 1704 09/22/20 0600  Weight: 99.8 kg 98.9 kg    Physical Exam   GEN: Obese, in no acute distress.  HEENT: Grossly normal.  Neck: Supple, no JVD, carotid bruits, or masses. Cardiac: IR, IR, no murmurs, rubs, or gallops. No clubbing, cyanosis, edema.  Radials 2+, R fem cath site w/o bleeding/bruit/hematoma.  R BKA. L  AKA. Respiratory:  Respirations regular and unlabored, clear to auscultation bilaterally. GI: Obese, soft, nontender, nondistended, BS + x 4. MS: no deformity or atrophy. Skin: warm and dry, no rash. Neuro:  Strength and sensation are intact. Psych: AAOx3.  Normal affect.  Labs    Chemistry Recent Labs  Lab 09/21/20 0505 09/22/20 0055 09/23/20 0413  NA 135 133* 134*  K 4.6 5.3* 4.9  CL 101 99 95*  CO2 '24 22 27  '$ GLUCOSE 111* 125* 116*  BUN 50* 54* 34*  CREATININE 6.45* 6.74* 4.84*  CALCIUM 8.8* 8.4* 8.8*  GFRNONAA 8* 8* 12*  ANIONGAP '10 12 12     '$ Hematology Recent Labs  Lab 09/21/20 0505 09/22/20 0055 09/23/20 0413  WBC 4.6 4.0 5.3  RBC 3.02* 2.71* 2.86*  HGB 8.9* 8.3* 8.6*  HCT 29.0* 26.1* 27.3*  MCV 96.0 96.3 95.5  MCH 29.5 30.6 30.1  MCHC 30.7 31.8 31.5  RDW 16.8* 16.9* 17.2*  PLT 98* 96* 106*    Cardiac Enzymes  Recent Labs  Lab 09/20/20 1712 09/20/20 1900  TROPONINIHS 338* 296*     DDimer  Recent Labs  Lab 09/20/20 1712  DDIMER 0.60*  HbA1c  Lab Results  Component Value Date   HGBA1C 5.5 11/10/2018    Radiology    DG Chest 2 View  Result Date: 09/20/2020 CLINICAL DATA:  Chest pain and shortness of breath. EXAM: CHEST - 2 VIEW COMPARISON:  Radiograph 09/09/2019 FINDINGS: Chronic cardiomegaly. This is unchanged from prior exam. Unchanged mediastinal contours with aortic atherosclerosis. No confluent airspace disease. There is mild peribronchial thickening. Trace fluid in the fissures. No large subpulmonic effusion. No pneumothorax. No acute osseous abnormalities are seen IMPRESSION: Chronic cardiomegaly. Mild peribronchial thickening suspicious for pulmonary edema. Minimal fluid in the fissures. Electronically Signed   By: Keith Rake M.D.   On: 09/20/2020 17:37   CT ANGIO CHEST AORTA W/CM & OR WO/CM  Result Date: 09/20/2020 CLINICAL DATA:  Chest pain short of breath EXAM: CT ANGIOGRAPHY CHEST WITH CONTRAST TECHNIQUE: Multidetector CT  imaging of the chest was performed using the standard protocol during bolus administration of intravenous contrast. Multiplanar CT image reconstructions and MIPs were obtained to evaluate the vascular anatomy. CONTRAST:  58m OMNIPAQUE IOHEXOL 350 MG/ML SOLN COMPARISON:  Chest x-ray 09/20/2020 FINDINGS: Cardiovascular: Non contrasted images of the chest demonstrate no acute intramural hematoma. Moderate aortic atherosclerosis. Negative for aneurysm or aortic dissection. Coronary vascular calcifications. Cardiomegaly with small pericardial effusion. No gross central filling defects within the pulmonary arteries. Mediastinum/Nodes: Midline trachea. Status post right thyroidectomy. Low right paratracheal node measuring 11 mm. Esophagus within normal limits. Lungs/Pleura: Lungs are clear. No pleural effusion or pneumothorax. Upper Abdomen: Gallstones. Questionable mild gallbladder wall thickening or edema. Atrophic appearing left kidney with multiple cysts. Musculoskeletal: Osteophytosis of the spine with large beak like osteophyte at T10-T11. Review of the MIP images confirms the above findings. IMPRESSION: 1. Negative for acute aortic aneurysm or dissection. No gross acute central embolus seen. 2. Cardiomegaly with small pericardial effusion 3. Gallstones. Possible mild gallbladder wall thickening or edema, suggest correlation with ultrasound Aortic Atherosclerosis (ICD10-I70.0). Electronically Signed   By: KDonavan FoilM.D.   On: 09/20/2020 18:36   Telemetry    Afib, 80's to 90's - Personally Reviewed  ECG    Afib, 99, low voltage, nonspecific T changes - Personally Reviewed  Cardiac Studies   2D Echocardiogram 7.18.2022   1. Left ventricular ejection fraction, by estimation, is 60 to 65%. The  left ventricle has normal function. The left ventricle has no regional  wall motion abnormalities. Left ventricular diastolic parameters are  indeterminate.   2. Right ventricular systolic function is  normal. The right ventricular  size is normal. Tricuspid regurgitation signal is inadequate for assessing  PA pressure.   3. Left atrial size was moderately dilated.   4. Right atrial size was moderately dilated.   5. Tricuspid valve regurgitation is mild to moderate.   6. The mitral valve is normal in structure. Mild mitral valve  regurgitation.   7. Rhythym is atrial fibrillation  _____________ Cardiac Catheterization/PCI 7.19.2022  Left Anterior Descending  There is mild diffuse disease throughout the vessel. The LAD courses to the apex. There is mild to moderate ostial stenosis present. There are no high-grade stenoses throughout the LAD distribution.  Ost LAD to Prox LAD lesion is 50% stenosed.  Left Circumflex  The vessel exhibits minimal luminal irregularities. The circumflex gives off a high obtuse marginal branch. The second obtuse marginal branch is subtotally occluded with 99% stenosis. The AV circumflex is patent with mild nonobstructive disease.  Second Obtuse Marginal Branch  2nd Mrg lesion is 99% stenosed. Vessel is the  culprit lesion. The lesion is type C and located at the bend. The lesion is severely calcified. The lesion was not previously treated .      **The OM2 was successfully treated w/ a 2.5 x 15 mm Resolute Onyx DES**  Right Coronary Artery  There is mild diffuse disease throughout the vessel. Dominant vessel without any tight obstructive disease. Supplies a PDA with no stenosis.  _____________   Patient Profile     75 y.o. male with a history of PAD, permanent Afib on eliquis, end-stage renal disease on hemodialysis, hypotension, morbid obesity, reported history of diabetes, and demand ischemia, who was admitted 7/17 w/ right sided c/p and NSTEMI.  Assessment & Plan    1.  NSTEMI/CAD:  Admitted 7/17 w/ chest pain and elevated troponin.  Cath w/ severe OM2 dzs s/p PCI/DES.  No c/p overnight.  Cont asa x 30 days, statin, plavix, and ? blocker.  I will arrange  for outpt f/u.  2.  Permanent Afib:  rate stable on ? blocker.  Eliquis resumed this AM.  3.  ESRD:  TTS HD per nephrology.  4.  Hypotension:  stable on midodrine.  5.  HL:  cont statin.  6.  Normocytic anemia:  stable.  Signed, Murray Hodgkins, NP  09/23/2020, 9:50 AM    For questions or updates, please contact   Please consult [email or web address] for contact info under Cardiology/STEMI.

## 2020-09-23 NOTE — Progress Notes (Signed)
[facility]  ROUNDING NOTE   Subjective:   Antonio Phillips is a 75 y.o. male with a past medical history of hypertension, CAD, rt BKA, and ESRD on dialysis. He presented to the ED from his SNF for chest pain and shortness of breath.   Patient currently receives dialysis at [facility], supervised by Dr. Candiss Norse. We have been consulted to manage dialysis during this admission  Patient seen resting in bed Alert and oriented States he's tolerating meals Denies nausea and vomiting Denies shortness of breath Denies further chest pain   Objective:  Vital signs in last 24 hours:  Temp:  [97.8 F (36.6 C)-99.8 F (37.7 C)] 98.1 F (36.7 C) (07/20 1112) Pulse Rate:  [76-106] 91 (07/20 1112) Resp:  [11-20] 16 (07/20 1112) BP: (68-122)/(49-79) 97/53 (07/20 1112) SpO2:  [96 %-100 %] 98 % (07/20 1112)  Weight change:  Filed Weights   09/20/20 1704 09/22/20 0600  Weight: 99.8 kg 98.9 kg    Intake/Output: I/O last 3 completed shifts: In: 348.7 [P.O.:240; I.V.:108.7] Out: 145 [Urine:400]   Intake/Output this shift:  Total I/O In: 480 [P.O.:480] Out: 200 [Urine:200]  Physical Exam: General: NAD, resting in bed  Head: Normocephalic, atraumatic. Moist oral mucosal membranes  Eyes: Anicteric  Lungs:  Clear to auscultation, normal effort  Heart: Regular rate and rhythm  Abdomen:  Soft, nontender  Extremities:  trace peripheral edema.Rt BKA  Neurologic: Nonfocal, moving all four extremities  Skin: No lesions  Access: Lt AVF    Basic Metabolic Panel: Recent Labs  Lab 09/20/20 1712 09/21/20 0505 09/22/20 0055 09/23/20 0413  NA 134* 135 133* 134*  K 4.7 4.6 5.3* 4.9  CL 101 101 99 95*  CO2 '23 24 22 27  '$ GLUCOSE 143* 111* 125* 116*  BUN 44* 50* 54* 34*  CREATININE 5.69* 6.45* 6.74* 4.84*  CALCIUM 8.9 8.8* 8.4* 8.8*     Liver Function Tests: No results for input(s): AST, ALT, ALKPHOS, BILITOT, PROT, ALBUMIN in the last 168 hours. No results for  input(s): LIPASE, AMYLASE in the last 168 hours. No results for input(s): AMMONIA in the last 168 hours.  CBC: Recent Labs  Lab 09/20/20 1712 09/21/20 0505 09/22/20 0055 09/23/20 0413  WBC 5.2 4.6 4.0 5.3  HGB 9.2* 8.9* 8.3* 8.6*  HCT 29.6* 29.0* 26.1* 27.3*  MCV 96.4 96.0 96.3 95.5  PLT 103* 98* 96* 106*     Cardiac Enzymes: No results for input(s): CKTOTAL, CKMB, CKMBINDEX, TROPONINI in the last 168 hours.  BNP: Invalid input(s): POCBNP  CBG: Recent Labs  Lab 09/20/20 1727  GLUCAP 145*     Microbiology: Results for orders placed or performed during the hospital encounter of 09/20/20  SARS CORONAVIRUS 2 (TAT 6-24 HRS) Nasopharyngeal Nasopharyngeal Swab     Status: None   Collection Time: 09/20/20  9:27 PM   Specimen: Nasopharyngeal Swab  Result Value Ref Range Status   SARS Coronavirus 2 NEGATIVE NEGATIVE Final    Comment: (NOTE) SARS-CoV-2 target nucleic acids are NOT DETECTED.  The SARS-CoV-2 RNA is generally detectable in upper and lower respiratory specimens during the acute phase of infection. Negative results do not preclude SARS-CoV-2 infection, do not rule out co-infections with other pathogens, and should not be used as the sole basis for treatment or other patient management decisions. Negative results must be combined with clinical observations, patient history, and epidemiological information. The expected result is Negative.  Fact Sheet for Patients: SugarRoll.be  Fact Sheet for Healthcare Providers: https://www.woods-mathews.com/  This test is not yet approved or cleared by the [place] and  has been authorized for detection and/or diagnosis of SARS-CoV-2 by FDA under an Emergency Use Authorization (EUA). This EUA will remain  in effect (meaning this test can be used) for the duration of the COVID-19 declaration under Se ction 564(b)(1) of the Act, 21 U.S.C. section 360bbb-3(b)(1), unless the  authorization is terminated or revoked sooner.  Performed at [facility], [place] [address] [place], [place] [address]   MRSA Next Gen by PCR, Nasal     Status: Abnormal   Collection Time: 09/21/20  1:20 PM   Specimen: Nasal Mucosa; Nasal Swab  Result Value Ref Range Status   MRSA by PCR Next Gen DETECTED (A) NOT DETECTED Final    Comment: CRITICAL RESULT CALLED TO, READ BACK BY AND VERIFIED WITH: Arnetha Courser [id number] 09/21/20 GM (NOTE) The GeneXpert MRSA Assay (FDA approved for NASAL specimens only), is one component of a comprehensive MRSA colonization surveillance program. It is not intended to diagnose MRSA infection nor to guide or monitor treatment for MRSA infections. Test performance is not FDA approved in patients less than 60 years old. Performed at [facility], [address] [place], [place] [address]     Coagulation Studies: No results for input(s): LABPROT, INR in the last 72 hours.  Urinalysis: No results for input(s): COLORURINE, LABSPEC, PHURINE, GLUCOSEU, HGBUR, BILIRUBINUR, KETONESUR, PROTEINUR, UROBILINOGEN, NITRITE, LEUKOCYTESUR in the last 72 hours.  Invalid input(s): APPERANCEUR    Imaging: CARDIAC CATHETERIZATION  Result Date: 09/22/2020 Formatting of this result is different from the original.   Ost LAD to Prox LAD lesion is 50% stenosed.   2nd Mrg lesion is 99% stenosed.   A drug-eluting stent was successfully placed using a STENT RESOLUTE ONYX 2.5X15.   Post intervention, there is a 30% residual stenosis. 1.  Severe single-vessel coronary artery disease with 99% subtotal stenosis of the second obtuse marginal branch, treated with a 2.5 x 15 mm resolute Onyx DES.  30% residual stenosis at the case completion due to resistant/calcified lesion type even with high-pressure noncompliant balloon angioplasty. 2.  Mild to moderate nonobstructive proximal LAD stenosis 3.  Mild nonobstructive RCA stenosis Recommendations: Aggressive medical  therapy, as long as no bleeding complications arise, would resume apixaban tomorrow, aspirin 81 mg daily x30 days, and clopidogrel 75 mg daily x minimum 6 months.     Medications:    sodium chloride      apixaban  5 mg Oral BID   aspirin  81 mg Oral Daily   atorvastatin  40 mg Oral QHS   carvedilol  3.125 mg Oral BID WC   Chlorhexidine Gluconate Cloth  6 each Topical Q0600   clopidogrel  75 mg Oral Q breakfast   diclofenac Sodium  2 g Topical BID   midodrine  10 mg Oral Q T,Th,Sa-HD   midodrine  5 mg Oral Daily   mupirocin ointment  1 application Nasal BID   pregabalin  75 mg Oral QHS   rOPINIRole  1 mg Oral QHS   sevelamer carbonate  1,600 mg Oral BID WC   sevelamer carbonate  800 mg Oral Once per day on Sun Mon Wed Fri   sodium chloride flush  3 mL Intravenous Q12H   sodium chloride flush  3 mL Intravenous Q12H   sodium chloride, acetaminophen **OR** acetaminophen, HYDROcodone-acetaminophen, morphine injection, ondansetron **OR** ondansetron (ZOFRAN) IV, oxyCODONE, sodium chloride flush  Assessment/ Plan:  Mr. Antonio Scharpf  Phillips is a 75 y.o.  male with a past medical history of hypertension, CAD, rt BKA, and ESRD on dialysis. He presented to the ED from his SNF for chest pain and shortness of breath.   [facility] N /TTS/Lt AVF  Anemia of chronic kidney disease   Lab Results  Component Value Date   HGB 8.6 (L) 09/23/2020   Hgb below target Will monitor   2. End stage renal disease on dialysis Will maintain outpatient schedule during this admission Received dialysis after cardiac cath Tolerated fair, soft BP, no UF removed Given Midodrine '10mg'$  during treatment Next treatment scheduled for Thursday  3. Secondary Hyperparathyroidism:   Lab Results  Component Value Date   CALCIUM 8.8 (L) 09/23/2020   PHOS 7.7 (H) 01/14/2020  Phosphorus and calcium not at goal Renvela with meals   4. ACS/NSTEMI- s/p coronary angiogram-severe single vessel CAD 99%  stenosis. Treated with DES, 30% residual stenosis Apixaban, asa, Plavix for 6 months, with plan to stop ASA in a month.  Cardiology to follow closely    LOS: 2 [place] 7/20/202212:41 PM

## 2020-09-23 NOTE — TOC Transition Note (Signed)
Transition of Care [facility]) - CM/SW Discharge Note   Patient Details  Name: Antonio Phillips MRN: [id number] Date of Birth: Sep 13, 1945  [facility] CM/SW Contact:  Alberteen Sam, LCSW Phone Number: 09/23/2020, 10:08 AM   Clinical Narrative:     Patient will DC to: [facility] Anticipated DC date: 09/23/20 Transport by: Johnanna Schneiders  Per MD patient ready for DC to [facility] . RN, patient, patient's family, and facility notified of DC. Discharge Summary sent to facility. RN given number for report  [phone] ask for Leata Mouse. DC packet on chart. Ambulance transport requested for patient.  CSW signing off.  Pricilla Riffle, LCSW    Final next level of care: Skilled Nursing Facility Barriers to Discharge: No Barriers Identified   Patient Goals and CMS Choice Patient states their goals for this hospitalization and ongoing recovery are:: to go back to SNF CMS Medicare.gov Compare Post Acute Care list provided to:: Patient Choice offered to / list presented to : Patient  Discharge Placement              Patient chooses bed at: [facility] Patient to be transferred to facility by: [facility]   Patient and family notified of of transfer: 09/23/20  Discharge Plan and Services                                     Social Determinants of Health (SDOH) Interventions     Readmission Risk Interventions Readmission Risk Prevention Plan 09/10/2019 11/14/2018 11/09/2018  Transportation Screening Complete Complete Complete  PCP or Specialist Appt within 3-5 Days Patient refused Patient refused -  [place] or [place] Patient refused - -  Social Work Consult for [place] Planning/Counseling Complete - -  [place] Screening Not Applicable Not Applicable -  Medication Review (RN Care Manager) Complete Complete Complete  Some recent data might be hidden

## 2020-09-26 ENCOUNTER — Emergency Department: Payer: No Typology Code available for payment source

## 2020-09-26 ENCOUNTER — Inpatient Hospital Stay: Payer: No Typology Code available for payment source

## 2020-09-26 ENCOUNTER — Inpatient Hospital Stay
Admission: EM | Admit: 2020-09-26 | Discharge: 2020-09-30 | DRG: 871 | Disposition: A | Discharge status: Skilled Nursing Facility | Payer: No Typology Code available for payment source | Source: Skilled Nursing Facility | Attending: Internal Medicine | Admitting: Internal Medicine

## 2020-09-26 ENCOUNTER — Other Ambulatory Visit: Payer: Self-pay

## 2020-09-26 ENCOUNTER — Encounter: Payer: Self-pay | Admitting: Emergency Medicine

## 2020-09-26 DIAGNOSIS — N2581 Secondary hyperparathyroidism of renal origin: Secondary | ICD-10-CM | POA: Diagnosis present

## 2020-09-26 DIAGNOSIS — L89322 Pressure ulcer of left buttock, stage 2: Secondary | ICD-10-CM | POA: Diagnosis present

## 2020-09-26 DIAGNOSIS — I9589 Other hypotension: Secondary | ICD-10-CM | POA: Diagnosis present

## 2020-09-26 DIAGNOSIS — Z89612 Acquired absence of left leg above knee: Secondary | ICD-10-CM | POA: Diagnosis not present

## 2020-09-26 DIAGNOSIS — Z955 Presence of coronary angioplasty implant and graft: Secondary | ICD-10-CM | POA: Diagnosis not present

## 2020-09-26 DIAGNOSIS — A4151 Sepsis due to Escherichia coli [E. coli]: Principal | ICD-10-CM | POA: Diagnosis present

## 2020-09-26 DIAGNOSIS — E669 Obesity, unspecified: Secondary | ICD-10-CM | POA: Diagnosis present

## 2020-09-26 DIAGNOSIS — Z96643 Presence of artificial hip joint, bilateral: Secondary | ICD-10-CM | POA: Diagnosis present

## 2020-09-26 DIAGNOSIS — E875 Hyperkalemia: Secondary | ICD-10-CM | POA: Diagnosis present

## 2020-09-26 DIAGNOSIS — N186 End stage renal disease: Secondary | ICD-10-CM

## 2020-09-26 DIAGNOSIS — Z7902 Long term (current) use of antithrombotics/antiplatelets: Secondary | ICD-10-CM

## 2020-09-26 DIAGNOSIS — L89892 Pressure ulcer of other site, stage 2: Secondary | ICD-10-CM | POA: Diagnosis present

## 2020-09-26 DIAGNOSIS — G2581 Restless legs syndrome: Secondary | ICD-10-CM | POA: Diagnosis present

## 2020-09-26 DIAGNOSIS — Z79899 Other long term (current) drug therapy: Secondary | ICD-10-CM

## 2020-09-26 DIAGNOSIS — I4891 Unspecified atrial fibrillation: Secondary | ICD-10-CM

## 2020-09-26 DIAGNOSIS — T148XXA Other injury of unspecified body region, initial encounter: Secondary | ICD-10-CM | POA: Diagnosis not present

## 2020-09-26 DIAGNOSIS — D631 Anemia in chronic kidney disease: Secondary | ICD-10-CM | POA: Diagnosis present

## 2020-09-26 DIAGNOSIS — Z9049 Acquired absence of other specified parts of digestive tract: Secondary | ICD-10-CM | POA: Diagnosis not present

## 2020-09-26 DIAGNOSIS — R079 Chest pain, unspecified: Secondary | ICD-10-CM

## 2020-09-26 DIAGNOSIS — I2510 Atherosclerotic heart disease of native coronary artery without angina pectoris: Secondary | ICD-10-CM | POA: Diagnosis present

## 2020-09-26 DIAGNOSIS — I252 Old myocardial infarction: Secondary | ICD-10-CM

## 2020-09-26 DIAGNOSIS — R2681 Unsteadiness on feet: Secondary | ICD-10-CM | POA: Diagnosis not present

## 2020-09-26 DIAGNOSIS — E1151 Type 2 diabetes mellitus with diabetic peripheral angiopathy without gangrene: Secondary | ICD-10-CM | POA: Diagnosis present

## 2020-09-26 DIAGNOSIS — Z6830 Body mass index (BMI) 30.0-30.9, adult: Secondary | ICD-10-CM

## 2020-09-26 DIAGNOSIS — Z7982 Long term (current) use of aspirin: Secondary | ICD-10-CM

## 2020-09-26 DIAGNOSIS — R7989 Other specified abnormal findings of blood chemistry: Secondary | ICD-10-CM

## 2020-09-26 DIAGNOSIS — E1122 Type 2 diabetes mellitus with diabetic chronic kidney disease: Secondary | ICD-10-CM | POA: Diagnosis present

## 2020-09-26 DIAGNOSIS — I4821 Permanent atrial fibrillation: Secondary | ICD-10-CM | POA: Diagnosis present

## 2020-09-26 DIAGNOSIS — B9629 Other Escherichia coli [E. coli] as the cause of diseases classified elsewhere: Secondary | ICD-10-CM | POA: Diagnosis not present

## 2020-09-26 DIAGNOSIS — Z992 Dependence on renal dialysis: Secondary | ICD-10-CM | POA: Diagnosis not present

## 2020-09-26 DIAGNOSIS — N400 Enlarged prostate without lower urinary tract symptoms: Secondary | ICD-10-CM | POA: Diagnosis present

## 2020-09-26 DIAGNOSIS — Z48812 Encounter for surgical aftercare following surgery on the circulatory system: Secondary | ICD-10-CM | POA: Diagnosis not present

## 2020-09-26 DIAGNOSIS — Z7901 Long term (current) use of anticoagulants: Secondary | ICD-10-CM

## 2020-09-26 DIAGNOSIS — E785 Hyperlipidemia, unspecified: Secondary | ICD-10-CM | POA: Diagnosis present

## 2020-09-26 DIAGNOSIS — I214 Non-ST elevation (NSTEMI) myocardial infarction: Secondary | ICD-10-CM | POA: Diagnosis not present

## 2020-09-26 DIAGNOSIS — Z89511 Acquired absence of right leg below knee: Secondary | ICD-10-CM

## 2020-09-26 DIAGNOSIS — I739 Peripheral vascular disease, unspecified: Secondary | ICD-10-CM | POA: Diagnosis not present

## 2020-09-26 DIAGNOSIS — R778 Other specified abnormalities of plasma proteins: Secondary | ICD-10-CM

## 2020-09-26 DIAGNOSIS — R7881 Bacteremia: Secondary | ICD-10-CM | POA: Diagnosis not present

## 2020-09-26 DIAGNOSIS — Z20822 Contact with and (suspected) exposure to covid-19: Secondary | ICD-10-CM | POA: Diagnosis present

## 2020-09-26 DIAGNOSIS — I248 Other forms of acute ischemic heart disease: Secondary | ICD-10-CM | POA: Diagnosis not present

## 2020-09-26 DIAGNOSIS — I959 Hypotension, unspecified: Secondary | ICD-10-CM

## 2020-09-26 LAB — BASIC METABOLIC PANEL
Anion gap: 9 (ref 5–15)
BUN: 41 mg/dL — ABNORMAL HIGH (ref 8–23)
CO2: 26 mmol/L (ref 22–32)
Calcium: 8.6 mg/dL — ABNORMAL LOW (ref 8.9–10.3)
Chloride: 102 mmol/L (ref 98–111)
Creatinine, Ser: 5.66 mg/dL — ABNORMAL HIGH (ref 0.61–1.24)
GFR, Estimated: 10 mL/min — ABNORMAL LOW (ref >60)
Glucose, Bld: 128 mg/dL — ABNORMAL HIGH (ref 70–99)
Potassium: 4.6 mmol/L (ref 3.5–5.1)
Sodium: 137 mmol/L (ref 135–145)

## 2020-09-26 LAB — PROTIME-INR
INR: 1.7 — ABNORMAL HIGH (ref 0.8–1.2)
Prothrombin Time: 20.1 seconds — ABNORMAL HIGH (ref 11.4–15.2)

## 2020-09-26 LAB — CBC
HCT: 27.2 % — ABNORMAL LOW (ref 39.0–52.0)
HCT: 33.7 % — ABNORMAL LOW (ref 39.0–52.0)
Hemoglobin: 8.6 g/dL — ABNORMAL LOW (ref 13.0–17.0)
Hemoglobin: 10.4 g/dL — ABNORMAL LOW (ref 13.0–17.0)
MCH: 31.4 pg (ref 26.0–34.0)
MCH: 30.6 pg (ref 26.0–34.0)
MCHC: 31.6 g/dL (ref 30.0–36.0)
MCHC: 30.9 g/dL (ref 30.0–36.0)
MCV: 99.3 fL (ref 80.0–100.0)
MCV: 99.1 fL (ref 80.0–100.0)
Platelets: 107 10*3/uL — ABNORMAL LOW (ref 150–400)
Platelets: 139 10*3/uL — ABNORMAL LOW (ref 150–400)
RBC: 2.74 MIL/uL — ABNORMAL LOW (ref 4.22–5.81)
RBC: 3.4 MIL/uL — ABNORMAL LOW (ref 4.22–5.81)
RDW: 17.2 % — ABNORMAL HIGH (ref 11.5–15.5)
RDW: 17.3 % — ABNORMAL HIGH (ref 11.5–15.5)
WBC: 5.7 10*3/uL (ref 4.0–10.5)
WBC: 8.3 10*3/uL (ref 4.0–10.5)
nRBC: 0 % (ref 0.0–0.2)
nRBC: 0 % (ref 0.0–0.2)

## 2020-09-26 LAB — TROPONIN I (HIGH SENSITIVITY)
Troponin I (High Sensitivity): 136 ng/L (ref <18)
Troponin I (High Sensitivity): 144 ng/L (ref <18)

## 2020-09-26 LAB — RENAL FUNCTION PANEL
Albumin: 3.6 g/dL (ref 3.5–5.0)
Anion gap: 16 — ABNORMAL HIGH (ref 5–15)
BUN: 41 mg/dL — ABNORMAL HIGH (ref 8–23)
CO2: 21 mmol/L — ABNORMAL LOW (ref 22–32)
Calcium: 9.3 mg/dL (ref 8.9–10.3)
Chloride: 98 mmol/L (ref 98–111)
Creatinine, Ser: 6 mg/dL — ABNORMAL HIGH (ref 0.61–1.24)
GFR, Estimated: 9 mL/min — ABNORMAL LOW (ref >60)
Glucose, Bld: 101 mg/dL — ABNORMAL HIGH (ref 70–99)
Phosphorus: 4.9 mg/dL — ABNORMAL HIGH (ref 2.5–4.6)
Potassium: 6.3 mmol/L (ref 3.5–5.1)
Sodium: 135 mmol/L (ref 135–145)

## 2020-09-26 LAB — HEPARIN LEVEL (UNFRACTIONATED): Heparin Unfractionated: >1.1 IU/mL — ABNORMAL HIGH (ref 0.30–0.70)

## 2020-09-26 LAB — CBG MONITORING, ED: Glucose-Capillary: 102 mg/dL — ABNORMAL HIGH (ref 70–99)

## 2020-09-26 LAB — PROCALCITONIN: Procalcitonin: 2.08 ng/mL

## 2020-09-26 LAB — APTT: aPTT: 44 seconds — ABNORMAL HIGH (ref 24–36)

## 2020-09-26 MED ORDER — SODIUM CHLORIDE 0.9 % IV SOLN: 100.0000 mL | INTRAVENOUS | Status: DC | PRN

## 2020-09-26 MED ORDER — MIDODRINE HCL 5 MG PO TABS
5.0000 mg | ORAL_TABLET | Freq: Once | ORAL | Status: AC
  Administered 2020-09-26: 5 mg via ORAL
  Filled 2020-09-26: qty 1

## 2020-09-26 MED ORDER — ACETAMINOPHEN 650 MG RE SUPP: 650.0000 mg | Freq: Four times a day (QID) | RECTAL | Status: DC | PRN

## 2020-09-26 MED ORDER — VANCOMYCIN HCL 1750 MG/350ML IV SOLN
1750.0000 mg | Freq: Once | INTRAVENOUS | Status: AC
  Administered 2020-09-26: 1750 mg via INTRAVENOUS
  Filled 2020-09-26: qty 350

## 2020-09-26 MED ORDER — ACETAMINOPHEN 325 MG PO TABS
650.0000 mg | ORAL_TABLET | Freq: Once | ORAL | Status: AC
  Administered 2020-09-26: 650 mg via ORAL
  Filled 2020-09-26: qty 2

## 2020-09-26 MED ORDER — SODIUM CHLORIDE 0.9 % IV SOLN
100.0000 mL | INTRAVENOUS | Status: DC | PRN
  Administered 2020-09-26 (×3): 100 mL via INTRAVENOUS

## 2020-09-26 MED ORDER — MIDODRINE HCL 5 MG PO TABS
5.0000 mg | ORAL_TABLET | Freq: Every day | ORAL | Status: DC
  Administered 2020-09-27 – 2020-09-30 (×3): 5 mg via ORAL
  Filled 2020-09-26 (×4): qty 1

## 2020-09-26 MED ORDER — ACETAMINOPHEN 325 MG PO TABS
650.0000 mg | ORAL_TABLET | Freq: Four times a day (QID) | ORAL | Status: DC | PRN
  Administered 2020-09-26: 650 mg via ORAL
  Filled 2020-09-26: qty 2

## 2020-09-26 MED ORDER — ONDANSETRON HCL 4 MG/2ML IJ SOLN: 4.0000 mg | Freq: Four times a day (QID) | INTRAMUSCULAR | Status: DC | PRN

## 2020-09-26 MED ORDER — FENTANYL CITRATE (PF) 100 MCG/2ML IJ SOLN
50.0000 ug | Freq: Once | INTRAMUSCULAR | Status: AC
  Administered 2020-09-26: 50 ug via INTRAVENOUS
  Filled 2020-09-26: qty 2

## 2020-09-26 MED ORDER — SODIUM CHLORIDE 0.9 % IV SOLN
1.0000 g | INTRAVENOUS | Status: DC
  Administered 2020-09-26: 1 g via INTRAVENOUS
  Filled 2020-09-26 (×2): qty 1

## 2020-09-26 MED ORDER — LORATADINE 10 MG PO TABS
10.0000 mg | ORAL_TABLET | ORAL | Status: DC
  Administered 2020-09-27 – 2020-09-30 (×3): 10 mg via ORAL
  Filled 2020-09-26 (×3): qty 1

## 2020-09-26 MED ORDER — MIDODRINE HCL 5 MG PO TABS
10.0000 mg | ORAL_TABLET | ORAL | Status: DC
  Administered 2020-09-29: 10 mg via ORAL
  Filled 2020-09-26: qty 2

## 2020-09-26 MED ORDER — SEVELAMER CARBONATE 800 MG PO TABS
800.0000 mg | ORAL_TABLET | ORAL | Status: DC
  Administered 2020-09-26 – 2020-09-29 (×2): 800 mg via ORAL
  Filled 2020-09-26 (×2): qty 1

## 2020-09-26 MED ORDER — FINASTERIDE 5 MG PO TABS
5.0000 mg | ORAL_TABLET | Freq: Every day | ORAL | Status: DC
  Administered 2020-09-27 – 2020-09-30 (×4): 5 mg via ORAL
  Filled 2020-09-26 (×5): qty 1

## 2020-09-26 MED ORDER — CHLORHEXIDINE GLUCONATE CLOTH 2 % EX PADS
6.0000 | MEDICATED_PAD | Freq: Every day | CUTANEOUS | Status: DC
  Administered 2020-09-29 – 2020-09-30 (×2): 6 via TOPICAL
  Filled 2020-09-26 (×3): qty 6

## 2020-09-26 MED ORDER — SEVELAMER CARBONATE 800 MG PO TABS
1600.0000 mg | ORAL_TABLET | ORAL | Status: DC
  Administered 2020-09-27 – 2020-09-30 (×5): 1600 mg via ORAL
  Filled 2020-09-26 (×9): qty 2

## 2020-09-26 MED ORDER — HYDROCODONE-ACETAMINOPHEN 5-325 MG PO TABS
1.0000 | ORAL_TABLET | ORAL | Status: DC | PRN
  Administered 2020-09-27: 1 via ORAL
  Administered 2020-09-28 – 2020-09-30 (×5): 2 via ORAL
  Filled 2020-09-26 (×2): qty 2
  Filled 2020-09-26: qty 1
  Filled 2020-09-26 (×3): qty 2

## 2020-09-26 MED ORDER — LIDOCAINE HCL (PF) 1 % IJ SOLN
5.0000 mL | INTRAMUSCULAR | Status: DC | PRN
  Filled 2020-09-26: qty 5

## 2020-09-26 MED ORDER — HEPARIN BOLUS VIA INFUSION
2000.0000 [IU] | Freq: Once | INTRAVENOUS | Status: AC
  Administered 2020-09-26: 2000 [IU] via INTRAVENOUS
  Filled 2020-09-26: qty 2000

## 2020-09-26 MED ORDER — MORPHINE SULFATE (PF) 2 MG/ML IV SOLN: 2.0000 mg | INTRAVENOUS | Status: DC | PRN

## 2020-09-26 MED ORDER — NITROGLYCERIN 2 % TD OINT
0.5000 [in_us] | TOPICAL_OINTMENT | Freq: Once | TRANSDERMAL | Status: AC
  Administered 2020-09-26: 0.5 [in_us] via TOPICAL
  Filled 2020-09-26 (×2): qty 1

## 2020-09-26 MED ORDER — LACTATED RINGERS IV BOLUS
500.0000 mL | Freq: Once | INTRAVENOUS | Status: AC
  Administered 2020-09-26: 500 mL via INTRAVENOUS

## 2020-09-26 MED ORDER — ATORVASTATIN CALCIUM 20 MG PO TABS
40.0000 mg | ORAL_TABLET | Freq: Every day | ORAL | Status: DC
  Administered 2020-09-26 – 2020-09-29 (×4): 40 mg via ORAL
  Filled 2020-09-26 (×4): qty 2

## 2020-09-26 MED ORDER — POLYVINYL ALCOHOL 1.4 % OP SOLN
1.0000 [drp] | Freq: Three times a day (TID) | OPHTHALMIC | Status: DC | PRN
  Filled 2020-09-26: qty 15

## 2020-09-26 MED ORDER — ONDANSETRON HCL 4 MG PO TABS: 4.0000 mg | ORAL_TABLET | Freq: Four times a day (QID) | ORAL | Status: DC | PRN

## 2020-09-26 MED ORDER — NITROGLYCERIN 2 % TD OINT: 1.0000 [in_us] | TOPICAL_OINTMENT | Freq: Once | TRANSDERMAL | Status: DC

## 2020-09-26 MED ORDER — METOPROLOL TARTRATE 25 MG PO TABS
12.5000 mg | ORAL_TABLET | Freq: Four times a day (QID) | ORAL | Status: DC
  Administered 2020-09-27: 12.5 mg via ORAL
  Filled 2020-09-26: qty 1

## 2020-09-26 MED ORDER — HEPARIN (PORCINE) 25000 UT/250ML-% IV SOLN
1000.0000 [IU]/h | INTRAVENOUS | Status: DC
  Administered 2020-09-26: 1000 [IU]/h via INTRAVENOUS
  Filled 2020-09-26: qty 250

## 2020-09-26 MED ORDER — VANCOMYCIN HCL IN DEXTROSE 1-5 GM/200ML-% IV SOLN: 1000.0000 mg | INTRAVENOUS | Status: DC

## 2020-09-26 MED ORDER — CLOPIDOGREL BISULFATE 75 MG PO TABS
75.0000 mg | ORAL_TABLET | Freq: Every day | ORAL | Status: DC
  Administered 2020-09-27 – 2020-09-30 (×4): 75 mg via ORAL
  Filled 2020-09-26 (×4): qty 1

## 2020-09-26 MED ORDER — ROPINIROLE HCL 1 MG PO TABS
1.0000 mg | ORAL_TABLET | Freq: Every day | ORAL | Status: DC
  Administered 2020-09-26 – 2020-09-29 (×4): 1 mg via ORAL
  Filled 2020-09-26 (×4): qty 1

## 2020-09-26 MED ORDER — POLYETHYLENE GLYCOL 3350 17 G PO PACK: 17.0000 g | PACK | Freq: Every day | ORAL | Status: DC | PRN

## 2020-09-26 MED ORDER — LIDOCAINE-PRILOCAINE 2.5-2.5 % EX CREA
1.0000 "application " | TOPICAL_CREAM | CUTANEOUS | Status: DC | PRN
  Filled 2020-09-26: qty 5

## 2020-09-26 MED ORDER — PREGABALIN 75 MG PO CAPS
75.0000 mg | ORAL_CAPSULE | Freq: Every day | ORAL | Status: DC
  Administered 2020-09-26 – 2020-09-29 (×4): 75 mg via ORAL
  Filled 2020-09-26 (×4): qty 1

## 2020-09-26 MED ORDER — VITAMIN B-12 1000 MCG PO TABS
1000.0000 ug | ORAL_TABLET | Freq: Every day | ORAL | Status: DC
  Administered 2020-09-27 – 2020-09-30 (×4): 1000 ug via ORAL
  Filled 2020-09-26 (×4): qty 1

## 2020-09-26 MED ORDER — PENTAFLUOROPROP-TETRAFLUOROETH EX AERO
1.0000 "application " | INHALATION_SPRAY | CUTANEOUS | Status: DC | PRN
  Filled 2020-09-26: qty 30

## 2020-09-26 MED ORDER — ASPIRIN 81 MG PO CHEW
81.0000 mg | CHEWABLE_TABLET | Freq: Every day | ORAL | Status: DC
  Administered 2020-09-27 – 2020-09-29 (×3): 81 mg via ORAL
  Filled 2020-09-26 (×4): qty 1

## 2020-09-26 MED ORDER — HEPARIN SODIUM (PORCINE) 1000 UNIT/ML DIALYSIS
1000.0000 [IU] | INTRAMUSCULAR | Status: DC | PRN
  Filled 2020-09-26: qty 1

## 2020-09-26 MED ORDER — ALTEPLASE 2 MG IJ SOLR
2.0000 mg | Freq: Once | INTRAMUSCULAR | Status: DC | PRN
Start: 2020-09-26 — End: 2020-09-29
  Filled 2020-09-26: qty 2

## 2020-09-26 NOTE — Progress Notes (Signed)
Pharmacy Antibiotic Note  Antonio Phillips is a 75 y.o. male admitted on 09/26/2020 with  chest pain, fever, chills, and rigors .  Pharmacy has been consulted for Vancomycin and Cefepime dosing. Patient is ESRD on dialysis on a Tuesday, Thursday, Saturday schedule.  Plan: Cefepime 1g IV q24h (after dialysis on HD days) Vancomycin '1750mg'$  IV loading dose followed by 1g IV after each dialysis session Will follow up on dialysis schedule  Weight: 98 kg (216 lb 0.8 oz)  Temp (24hrs), Avg:98.5 F (36.9 C), Min:97.5 F (36.4 C), Max:99.6 F (37.6 C)  Recent Labs  Lab 09/20/20 1712 09/21/20 0505 09/22/20 0055 09/23/20 0413 09/26/20 1233 09/26/20 1542  WBC 5.2 4.6 4.0 5.3 5.7 8.3  CREATININE 5.69* 6.45* 6.74* 4.84* 5.66*  --     Estimated Creatinine Clearance: 13.5 mL/min (A) (by C-G formula based on SCr of 5.66 mg/dL (H)).    No Known Allergies  Antimicrobials this admission: Cefepime 7/23 >>  Vancomcyin 7/23 >>   Dose adjustments this admission:  Microbiology results:   Thank you for allowing pharmacy to be a part of this patient's care.  Paulina Fusi, PharmD, BCPS 09/26/2020 4:45 PM

## 2020-09-26 NOTE — Progress Notes (Signed)
HD start 

## 2020-09-26 NOTE — ED Provider Notes (Signed)
[facility] Emergency Department Provider Note  ____________________________________________   Event Date/Time   First MD Initiated Contact with Patient 09/26/20 1224     (approximate)  I have reviewed the triage vital signs and the nursing notes.   HISTORY  Chief Complaint Chest Pain    HPI Antonio Phillips is a 75 y.o. male  here with chest pain. Pt reports that since his cath recently, he's had some intermittent but not constant CP. This morning, woke up and pain was worse, pressure like, in middle and left side of his chest. Worse w/ waking up. He has also had some increased SOB and diaphoresis with this. He had some mild lightheadedness and blurred vision. This occurred at rest. No n/v. No abdominal pain. He has been taking his new med as prescribed - including asa, plavix, eliquis. He is ESRD and scheduled for HD today but did not go. No fevers, chills. No sputum production.        Past Medical History:  Diagnosis Date  . Demand ischemia ([place])    a. 11/2017 elev Trop in setting of AFib/SVT-->Echo [facility]): EF>55%. Mild LVH. Nl RV fxn.  . Diabetes ([place])    a. Pt states that he has no hx of diabetes--A1c 5.5 11/2018.  Marland Kitchen ESRD (end stage renal disease) ([place])    a. On HD since 2019.  Marland Kitchen Hyperlipidemia   . Hypotension    a. On midodrine.  . Morbid obesity ([place])   . PAD (peripheral artery disease) (H[facility])    a. s/p L AKA and R BKA.  Marland Kitchen Permanent atrial fibrillation ([place])    a. Dx 2019. CHA2DS2VASc = 3-4-->Eliquis.    Patient Active Problem List   Diagnosis Date Noted  . Chest pain 09/26/2020  . PVD (peripheral vascular disease) ([place])   . Shortness of breath 09/20/2020  . NSTEMI (non-ST elevated myocardial infarction) ([place]) 09/20/2020  . Fungal skin infection   . Atrial fibrillation, chronic ([place])   . Acquired thrombophilia ([place])   . 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 Anemia of chronic disease   .  Thrombocytopenia ([place])   . Sacral wound 01/13/2020  . Pressure injury of skin 01/13/2020  . Generalized weakness 01/13/2020  . ESRD on dialysis [facility]) 04/25/2018    Past Surgical History:  Procedure Laterality Date  . AV FISTULA REPAIR    . BELOW KNEE LEG AMPUTATION Bilateral   . CHOLECYSTECTOMY    . LEFT HEART CATH AND CORONARY ANGIOGRAPHY N/A 09/22/2020   Procedure: LEFT HEART CATH AND CORONARY ANGIOGRAPHY;  Surgeon: Sherren Mocha, MD;  Location: [facility];  Service: Cardiovascular;  Laterality: N/A;  . TOTAL HIP ARTHROPLASTY Bilateral     Prior to Admission medications   Medication Sig Start Date End Date Taking? Authorizing Provider  acetaminophen (TYLENOL) 325 MG tablet Take 2 tablets (650 mg total) by mouth every 6 (six) hours as needed for mild pain or moderate pain. 01/17/20  Yes Wieting, Richard, MD  apixaban (ELIQUIS) 5 MG TABS tablet Take 1 tablet (5 mg total) by mouth 2 (two) times daily. 01/17/20  Yes Loletha Grayer, MD  aspirin 81 MG chewable tablet Chew 1 tablet (81 mg total) by mouth daily. 09/23/20 10/22/20 Yes Wieting, Richard, MD  atorvastatin (LIPITOR) 40 MG tablet Take 40 mg by mouth at bedtime. 09/17/20  Yes [provider]  Carboxymethylcellulose Sod PF 0.5 % SOLN Apply 1 drop to eye 3 (three) times daily  as needed (dry eyes). 07/28/20  Yes [provider]  carvedilol (COREG) 3.125 MG tablet Take 1 tablet (3.125 mg total) by mouth 2 (two) times daily with a meal. 01/17/20  Yes Wieting, Richard, MD  clopidogrel (PLAVIX) 75 MG tablet Take 1 tablet (75 mg total) by mouth daily with breakfast. 09/23/20  Yes Wieting, Richard, MD  diclofenac Sodium (VOLTAREN) 1 % GEL Apply 2 g topically daily. (Apply to right stump once every morning) 09/08/20  Yes [provider]  finasteride (PROSCAR) 5 MG tablet Take 5 mg by mouth daily. 03/13/19  Yes [provider]  HYDROcodone-acetaminophen (NORCO/VICODIN) 5-325 MG tablet Take 1 tablet by  mouth every 6 (six) hours as needed for severe pain. 09/23/20  Yes Wieting, Richard, MD  lactulose, encephalopathy, (CHRONULAC) 10 GM/15ML SOLN Take 10 g by mouth 2 (two) times daily as needed (mild constipation).   Yes [provider]  loperamide (IMODIUM A-D) 2 MG tablet Take 2 tablets (4 mg total) by mouth 4 (four) times daily as needed for diarrhea or loose stools. 09/09/19  Yes Carrie Mew, MD  loratadine (CLARITIN) 10 MG tablet Take 10 mg by mouth See admin instructions. Take 10 mg by mouth daily on Monday, Wednesday, Friday and Sunday   Yes [provider]  midodrine (PROAMATINE) 10 MG tablet Take 10 mg by mouth Every Tuesday,Thursday,and Saturday with dialysis.   Yes [provider]  midodrine (PROAMATINE) 5 MG tablet Take 5 mg by mouth daily.   Yes [provider]  mupirocin ointment (BACTROBAN) 2 % Place 1 application into the nose 2 (two) times daily. 09/23/20  Yes Wieting, Richard, MD  pregabalin (LYRICA) 75 MG capsule Take 75 mg by mouth at bedtime.  11/02/17  Yes [provider]  rOPINIRole (REQUIP) 1 MG tablet Take 1 mg by mouth at bedtime. 03/13/19  Yes [provider]  sevelamer carbonate (RENVELA) 800 MG tablet Take 1,600 mg by mouth See admin instructions. Take 2 tablets ('1600mg'$ ) by mouth three times daily with meals on Monday, Wednesday, Friday and Sunday   Yes [provider]  sevelamer carbonate (RENVELA) 800 MG tablet Take 800 mg by mouth See admin instructions. Take 2 tablets ('1600mg'$ ) by mouth twice daily with meals (breakfast and dinner) on Tuesday, Thursday and Saturday   Yes [provider]  vitamin B-12 (CYANOCOBALAMIN) 1000 MCG tablet Take 1,000 mcg by mouth daily.   Yes [provider]    Allergies Patient has no known allergies.  No family history on file.  Social History Social History   Tobacco Use  . Smoking status: Never  . Smokeless tobacco: Never  Substance Use Topics  .  Alcohol use: Never  . Drug use: Never    Review of Systems  Review of Systems  Constitutional:  Positive for diaphoresis. Negative for chills and fever.  HENT:  Negative for sore throat.   Respiratory:  Positive for chest tightness and shortness of breath.   Cardiovascular:  Positive for chest pain.  Gastrointestinal:  Positive for nausea. Negative for abdominal pain.  Genitourinary:  Negative for flank pain.  Musculoskeletal:  Negative for neck pain.  Skin:  Negative for rash and wound.  Allergic/Immunologic: Negative for immunocompromised state.  Neurological:  Positive for weakness. Negative for numbness.  Hematological:  Does not bruise/bleed easily.  All other systems reviewed and are negative.   ____________________________________________  PHYSICAL EXAM:      VITAL SIGNS: ED Triage Vitals  Enc Vitals Group  BP 09/26/20 1230 (!) 94/45     Pulse Rate 09/26/20 1230 96     Resp 09/26/20 1230 17     Temp 09/26/20 1233 (!) 97.5 F (36.4 C)     Temp Source 09/26/20 1233 Oral     SpO2 09/26/20 1230 100 %     Weight 09/26/20 1230 216 lb 0.8 oz (98 kg)     Height --      Head Circumference --      Peak Flow --      Pain Score 09/26/20 1230 2     Pain Loc --      Pain Edu? --      Excl. in [place]? --      Physical Exam Vitals and nursing note reviewed.  Constitutional:      General: He is not in acute distress.    Appearance: He is well-developed.  HENT:     Head: Normocephalic and atraumatic.  Eyes:     Conjunctiva/sclera: Conjunctivae normal.  Cardiovascular:     Rate and Rhythm: Normal rate. Rhythm irregular.     Heart sounds: Normal heart sounds. No murmur heard.   No friction rub.  Pulmonary:     Effort: Pulmonary effort is normal. No respiratory distress.     Breath sounds: Examination of the right-middle field reveals wheezing. Examination of the left-middle field reveals wheezing. Examination of the right-lower field reveals wheezing. Examination of the  left-lower field reveals wheezing. Wheezing present. No rales.  Abdominal:     General: There is no distension.     Palpations: Abdomen is soft.     Tenderness: There is no abdominal tenderness.  Musculoskeletal:     Cervical back: Neck supple.  Skin:    General: Skin is warm.     Capillary Refill: Capillary refill takes less than 2 seconds.  Neurological:     Mental Status: He is alert and oriented to person, place, and time.     Motor: No abnormal muscle tone.      ____________________________________________   LABS (all labs ordered are listed, but only abnormal results are displayed)  Labs Reviewed  BASIC METABOLIC PANEL - Abnormal; Notable for the following components:      Result Value   Glucose, Bld 128 (*)    BUN 41 (*)    Creatinine, Ser 5.66 (*)    Calcium 8.6 (*)    GFR, Estimated 10 (*)    All other components within normal limits  CBC - Abnormal; Notable for the following components:   RBC 2.74 (*)    Hemoglobin 8.6 (*)    HCT 27.2 (*)    RDW 17.2 (*)    Platelets 107 (*)    All other components within normal limits  APTT - Abnormal; Notable for the following components:   aPTT 44 (*)    All other components within normal limits  PROTIME-INR - Abnormal; Notable for the following components:   Prothrombin Time 20.1 (*)    INR 1.7 (*)    All other components within normal limits  HEPARIN LEVEL (UNFRACTIONATED) - Abnormal; Notable for the following components:   Heparin Unfractionated >1.10 (*)    All other components within normal limits  RENAL FUNCTION PANEL - Abnormal; Notable for the following components:   Potassium 6.3 (*)    CO2 21 (*)    Glucose, Bld 101 (*)    BUN 41 (*)    Creatinine, Ser 6.00 (*)    Phosphorus 4.9 (*)  GFR, Estimated 9 (*)    Anion gap 16 (*)    All other components within normal limits  CBC - Abnormal; Notable for the following components:   RBC 3.40 (*)    Hemoglobin 10.4 (*)    HCT 33.7 (*)    RDW 17.3 (*)     Platelets 139 (*)    All other components within normal limits  CBG MONITORING, ED - Abnormal; Notable for the following components:   Glucose-Capillary 102 (*)    All other components within normal limits  TROPONIN I (HIGH SENSITIVITY) - Abnormal; Notable for the following components:   Troponin I (High Sensitivity) 136 (*)    All other components within normal limits  TROPONIN I (HIGH SENSITIVITY) - Abnormal; Notable for the following components:   Troponin I (High Sensitivity) 144 (*)    All other components within normal limits  CULTURE, BLOOD (ROUTINE X 2)  CULTURE, BLOOD (ROUTINE X 2)  URINE CULTURE  RESP PANEL BY RT-PCR (FLU A&B, COVID) ARPGX2  PROCALCITONIN  APTT  URINALYSIS, COMPLETE (UACMP) WITH MICROSCOPIC  PROCALCITONIN  CBC  RENAL FUNCTION PANEL  HEPATITIS B SURFACE ANTIGEN  HEPATITIS B SURFACE ANTIBODY,QUALITATIVE    ____________________________________________  EKG: Atrial fibrillation, VR 91. QRS 85, QTc 423. No acute ischemic changes compared to prior. ________________________________________  RADIOLOGY All imaging, including plain films, CT scans, and ultrasounds, independently reviewed by me, and interpretations confirmed via formal radiology reads.  ED MD interpretation:   CXR: Cardiomegaly with mild interstitial prominence  Official radiology report(s): DG Chest 2 View  Result Date: 09/26/2020 CLINICAL DATA:  chest pain EXAM: CHEST - 2 VIEW COMPARISON:  September 20, 2020 FINDINGS: The cardiomediastinal silhouette is unchanged and enlarged in contour. No pleural effusion. No pneumothorax. Persistent mild interstitial prominence peribronchial cuffing. No acute pleuroparenchymal abnormality. Visualized abdomen is unremarkable. Multilevel degenerative changes of the thoracic spine. IMPRESSION: Cardiomegaly with persistent mild interstitial prominence likely reflecting a degree of chronic underlying pulmonary edema. Electronically Signed   By: Valentino Saxon MD    On: 09/26/2020 13:12    ____________________________________________  PROCEDURES   Procedure(s) performed (including Critical Care):  .Critical Care  Date/Time: 09/26/2020 7:45 PM Performed by: Duffy Bruce, MD Authorized by: Duffy Bruce, MD   Critical care provider statement:    Critical care time (minutes):  35   Critical care time was exclusive of:  Separately billable procedures and treating other patients and teaching time   Critical care was necessary to treat or prevent imminent or life-threatening deterioration of the following conditions:  Cardiac failure, circulatory failure and respiratory failure   Critical care was time spent personally by me on the following activities:  Development of treatment plan with patient or surrogate, discussions with consultants, evaluation of patient's response to treatment, examination of patient, obtaining history from patient or surrogate, ordering and performing treatments and interventions, ordering and review of laboratory studies, ordering and review of radiographic studies, pulse oximetry, re-evaluation of patient's condition and review of old charts   I assumed direction of critical care for this patient from another provider in my specialty: no    ____________________________________________  INITIAL IMPRESSION / MDM / [place] / ED COURSE  As part of my medical decision making, I reviewed the following data within the [place] notes reviewed and incorporated, Old chart reviewed, Notes from prior ED visits, and [place] Controlled Substance Database       *Antonio Phillips was evaluated in Emergency Department on  09/26/2020 for the symptoms described in the history of present illness. He was evaluated in the context of the global COVID-19 pandemic, which necessitated consideration that the patient might be at risk for infection with the SARS-CoV-2 virus that causes COVID-19. Institutional  protocols and algorithms that pertain to the evaluation of patients at risk for COVID-19 are in a state of rapid change based on information released by regulatory bodies including the CDC and federal and state organizations. These policies and algorithms were followed during the patient's care in the ED.  Some ED evaluations and interventions may be delayed as a result of limited staffing during the pandemic.*     Medical Decision Making:  75 yo male status post recent NSTEMI with cath placement here with chest pain.  Concern for in-stent stenosis/thrombosis given his symptoms.  Differential includes pneumonia, new stenosis, less likely PE given his significant anticoagulation.  Chest x-ray shows mild edema and patient is also due for his dialysis session today.  Troponin 136, stable to 144 on repeat.  Electrolytes overall acceptable.  Hemoglobin 8.6.  Patient temperature 99.6 and he does endorse some rigors.  No evidence of pneumonia.  No apparent skin infection or alternative source of infection.  Will send cultures.  Discussed with nephrology as well as cardiology.  Will admit to the ICU so patient can be dialyzed under close monitoring, transition to heparin, admit.  ____________________________________________  FINAL CLINICAL IMPRESSION(S) / ED DIAGNOSES  Final diagnoses:  Chest pain with high risk for cardiac etiology  ESRD (end stage renal disease) ([place])  Elevated troponin     MEDICATIONS GIVEN DURING THIS VISIT:  Medications  heparin ADULT infusion 100 units/mL (25000 units/293m) (1,000 Units/hr Intravenous New Bag/Given 09/26/20 1613)  Chlorhexidine Gluconate Cloth 2 % PADS 6 each (has no administration in time range)  pentafluoroprop-tetrafluoroeth (GEBAUERS) aerosol 1 application (has no administration in time range)  lidocaine (PF) (XYLOCAINE) 1 % injection 5 mL (has no administration in time range)  lidocaine-prilocaine (EMLA) cream 1 application (has no administration in time  range)  0.9 %  sodium chloride infusion (has no administration in time range)  0.9 %  sodium chloride infusion (has no administration in time range)  heparin injection 1,000 Units (has no administration in time range)  alteplase (CATHFLO ACTIVASE) injection 2 mg (has no administration in time range)  metoprolol tartrate (LOPRESSOR) tablet 12.5 mg (has no administration in time range)  ceFEPIme (MAXIPIME) 1 g in sodium chloride 0.9 % 100 mL IVPB (0 g Intravenous Stopped 09/26/20 1745)  vancomycin (VANCOREADY) IVPB 1750 mg/350 mL (1,750 mg Intravenous New Bag/Given 09/26/20 1752)  vancomycin (VANCOCIN) IVPB 1000 mg/200 mL premix (has no administration in time range)  acetaminophen (TYLENOL) tablet 650 mg (650 mg Oral Given 09/26/20 1927)    Or  acetaminophen (TYLENOL) suppository 650 mg ( Rectal See Alternative 09/26/20 1927)  HYDROcodone-acetaminophen (NORCO/VICODIN) 5-325 MG per tablet 1-2 tablet (has no administration in time range)  morphine 2 MG/ML injection 2 mg (has no administration in time range)  polyethylene glycol (MIRALAX / GLYCOLAX) packet 17 g (has no administration in time range)  ondansetron (ZOFRAN) tablet 4 mg (has no administration in time range)    Or  ondansetron (ZOFRAN) injection 4 mg (has no administration in time range)  aspirin chewable tablet 81 mg (has no administration in time range)  atorvastatin (LIPITOR) tablet 40 mg (has no administration in time range)  midodrine (PROAMATINE) tablet 10 mg (has no administration in time range)  midodrine (PROAMATINE) tablet  5 mg (has no administration in time range)  sevelamer carbonate (RENVELA) tablet 1,600 mg (has no administration in time range)  sevelamer carbonate (RENVELA) tablet 800 mg (has no administration in time range)  finasteride (PROSCAR) tablet 5 mg (has no administration in time range)  clopidogrel (PLAVIX) tablet 75 mg (has no administration in time range)  vitamin B-12 (CYANOCOBALAMIN) tablet 1,000 mcg (has no  administration in time range)  pregabalin (LYRICA) capsule 75 mg (has no administration in time range)  rOPINIRole (REQUIP) tablet 1 mg (has no administration in time range)  loratadine (CLARITIN) tablet 10 mg (has no administration in time range)  polyvinyl alcohol (LIQUIFILM TEARS) 1.4 % ophthalmic solution 1 drop (has no administration in time range)  fentaNYL (SUBLIMAZE) injection 50 mcg (50 mcg Intravenous Given 09/26/20 1422)  midodrine (PROAMATINE) tablet 5 mg (5 mg Oral Given 09/26/20 1422)  nitroGLYCERIN (NITROGLYN) 2 % ointment 0.5 inch (0.5 inches Topical Given 09/26/20 1422)  heparin bolus via infusion 2,000 Units (2,000 Units Intravenous Bolus from Bag 09/26/20 1613)  acetaminophen (TYLENOL) tablet 650 mg (650 mg Oral Given 09/26/20 1540)     ED Discharge Orders          Ordered    Amb referral to AFIB Clinic        09/26/20 1655             Note:  This document was prepared using Dragon voice recognition software and may include unintentional dictation errors.   Duffy Bruce, MD 09/26/20 [phone]

## 2020-09-26 NOTE — ED Notes (Signed)
Pt shaking, states "I'm freezing all of a sudden". Oral temp assessed, see charted vitals. Pt given three warm blankets.

## 2020-09-26 NOTE — ED Notes (Signed)
Pt awake and alert, eating dinner. Denies needs at present time. Stretcher locked in low position with side rails up x2, call light and personal belongings in reach.

## 2020-09-26 NOTE — Consult Note (Signed)
ANTICOAGULATION CONSULT NOTE  Pharmacy Consult for IV Heparin Indication: chest pain/ACS  Patient Measurements: Heparin Dosing Weight: 95 kg  Labs: Recent Labs    09/26/20 1233  HGB 8.6*  HCT 27.2*  PLT 107*  CREATININE 5.66*  TROPONINIHS 136*    Estimated Creatinine Clearance: 13.5 mL/min (A) (by C-G formula based on SCr of 5.66 mg/dL (H)).   Medical History: Past Medical History:  Diagnosis Date   Demand ischemia ([place])    a. 11/2017 elev Trop in setting of AFib/SVT-->Echo [facility]): EF>55%. Mild LVH. Nl RV fxn.   Diabetes ([place])    a. Pt states that he has no hx of diabetes--A1c 5.5 11/2018.   ESRD (end stage renal disease) ([place])    a. On HD since 2019.   Hyperlipidemia    Hypotension    a. On midodrine.   Morbid obesity ([place])    PAD (peripheral artery disease) ([place])    a. s/p L AKA and R BKA.   Permanent atrial fibrillation ([place])    a. Dx 2019. CHA2DS2VASc = 3-4-->Eliquis.    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 There is concern for possible stent thrombosis. Troponin 136. Pharmacy consulted to initiate heparin infusion for suspected ACS / possible stent thrombosis.   Baseline CBC notable for Hgb 8.6, platelets 106 which is consistent with recent admission. Baseline aPTT 44s, INR 1.7, HL > 1.1  Goal of Therapy:  Heparin level 0.3-0.7 units/ml aPTT 66-102 seconds Monitor platelets by anticoagulation protocol: Yes   Plan:  --Last doses of anticoagulation / antiplatelet medications presumed to be this AM --Discussed with EDP, will go ahead and start heparin but with reduced bolus --Heparin 2000 unit IV bolus followed by continuous infusion at 1000  units/hr (patient was therapeutic on this rate during previous admission) --Will check aPTT 8 hours after initiation of infusion. Can follow HL once correlation is established --Daily CBC per protocol while on IV heparin  [name] 09/26/2020,1:48 PM

## 2020-09-26 NOTE — Progress Notes (Signed)
Pre HD RN assessment 

## 2020-09-26 NOTE — ED Triage Notes (Signed)
Pt to ED via [facility] from [facility] for chest pain. Pt states that he has been having chest pain since last Sunday. Pain is more right sides chest pain. Pt was given 325 ASA and 2 Nitro tablets by staff at [facility]. Pt reports that he had a stent placed in Tuesday of last week.   EMS reports that pt has hx/o A Fib, pt was in A. FIb with EMS with a rate between 27-167, pt was given Metoprolol 5 mg of EMS around noon with no improvement. Pt BP per EMS 112/74. Pt hypotensive on arrival to ED at 94/45. Pt is in NAD.,

## 2020-09-26 NOTE — Progress Notes (Signed)
HD end 

## 2020-09-26 NOTE — ED Notes (Addendum)
Pt repositioned in bed, pillow placed under pt's left side. PRN fluid bolus given, see charted vitals and mar. Pt is asymptomatic with blood pressure, states "I don't feel like my blood pressure is low." Currently watching tv, a&ox4. Continuous cardiac monitoring, blood pressure, and pulse ox monitoring in use.

## 2020-09-26 NOTE — Progress Notes (Addendum)
HD start at [id number] w/ no complications. 3 hr, 1L uf goal, 2k+ acid bath. Pt alert, c/o chills, temperature WNL. Pt afib on monitor. Verbal consent and consent for HD signed w/ ED RN.

## 2020-09-26 NOTE — Progress Notes (Signed)
Per HD MD, holding HD until patient is roomed in ICU.

## 2020-09-26 NOTE — ED Notes (Addendum)
New orders received from attending MD, Dr Rowe Pavy. Will administer fluids as ordered - see mar, recheck blood pressure after, and contact attending MD prior to administering metoprolol.

## 2020-09-26 NOTE — Progress Notes (Signed)
Post HD vitals 

## 2020-09-26 NOTE — H&P (Signed)
History and Physical    Antonio Phillips [id number] DOB: Apr 08, 1945 DOA: 09/26/2020  PCP: Marsh Dolly, MD  Chief Complaint: Chest pain, chills, rigors  HPI: Antonio Phillips is a 75 y.o. 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  The patient presented to the emergency department with chest pain, chills and rigors.  He states he still had residual chest pain after his cardiac cath and PCI last week.  But it has worsened over the last 24 hours.  Started having chills at home as well.  Endorses some burning with urination.  Per patient still makes some urine.  Has a low-grade fever in the emergency department of 99.6.  Describes the chest pain is right sided and center.  Occurred while watching TV.  Non-palpational.  Nonpositional.  Describes as pressure-like.  No radiation.  Currently chest pain is 5 out of 10.  Dr. Harrington Challenger, cardiologist present upon my evaluation at bedside as well.  The patient states that he does have a sore/small ulcer on his left buttock.  His cath was done via right femoral access.  Does not appear to have a hematoma or other signs of infection in the right groin area.  ED Course: Lab work obtained.  Plain film chest x-ray obtained.  Fentanyl 50 g x 1.  Heparin bolus given and subsequently started on heparin drip.  Midodrine 5 mg p.o. x1 given.  Nitro 2% 0.5 inch topically x1 given.  Review of Systems: 14 point review of systems is negative except for what is mentioned above in the HPI.   Past Medical History:  Diagnosis Date   Demand ischemia ([place])    a. 11/2017 elev Trop in setting of AFib/SVT-->[facility]): EF>55%. Mild LVH. Nl RV fxn.   Diabetes ([place])    a. Pt states that he has no hx of diabetes--A1c 5.5 11/2018.   ESRD (end stage renal disease) ([place])    a. On HD  since 2019.   Hyperlipidemia    Hypotension    a. On midodrine.   Morbid obesity ([place])    PAD (peripheral artery disease) ([place])    a. s/p L AKA and R BKA.   Permanent atrial fibrillation ([place])    a. Dx 2019. CHA2DS2VASc = 3-4-->Eliquis.    Past Surgical History:  Procedure Laterality Date   AV FISTULA REPAIR     BELOW KNEE LEG AMPUTATION Bilateral    CHOLECYSTECTOMY     LEFT HEART CATH AND CORONARY ANGIOGRAPHY N/A 09/22/2020   Procedure: LEFT HEART CATH AND CORONARY ANGIOGRAPHY;  Surgeon: Sherren Mocha, MD;  Location: [facility];  Service: Cardiovascular;  Laterality: N/A;   TOTAL HIP ARTHROPLASTY Bilateral     Social History   Socioeconomic History   Marital status: Single    Spouse name: Not on file   Number of children: Not on file   Years of education: Not on file   Highest education level: Not on file  Occupational History   Not on file  Tobacco Use   Smoking status: Never   Smokeless tobacco: Never  Substance and Sexual Activity   Alcohol use: Never   Drug use: Never   Sexual activity: Not on file  Other Topics Concern   Not on file  Social History Narrative   Lives @ local nsg facility.  Does  not routinely exercise.   Social Determinants of Health   Financial Resource Strain: Not on file  Food Insecurity: Not on file  Transportation Needs: Not on file  Physical Activity: Not on file  Stress: Not on file  Social Connections: Not on file  Intimate Partner Violence: Not on file    No Known Allergies  No family history on file.  Prior to Admission medications   Medication Sig Start Date End Date Taking? Authorizing Provider  acetaminophen (TYLENOL) 325 MG tablet Take 2 tablets (650 mg total) by mouth every 6 (six) hours as needed for mild pain or moderate pain. 01/17/20  Yes Wieting, Richard, MD  apixaban (ELIQUIS) 5 MG TABS tablet Take 1 tablet (5 mg total) by mouth 2 (two) times daily. 01/17/20  Yes Loletha Grayer, MD  aspirin 81 MG  chewable tablet Chew 1 tablet (81 mg total) by mouth daily. 09/23/20 10/22/20 Yes Wieting, Richard, MD  atorvastatin (LIPITOR) 40 MG tablet Take 40 mg by mouth at bedtime. 09/17/20  Yes [provider]  Carboxymethylcellulose Sod PF 0.5 % SOLN Apply 1 drop to eye 3 (three) times daily as needed (dry eyes). 07/28/20  Yes [provider]  carvedilol (COREG) 3.125 MG tablet Take 1 tablet (3.125 mg total) by mouth 2 (two) times daily with a meal. 01/17/20  Yes Wieting, Richard, MD  clopidogrel (PLAVIX) 75 MG tablet Take 1 tablet (75 mg total) by mouth daily with breakfast. 09/23/20  Yes Wieting, Richard, MD  diclofenac Sodium (VOLTAREN) 1 % GEL Apply 2 g topically daily. (Apply to right stump once every morning) 09/08/20  Yes [provider]  finasteride (PROSCAR) 5 MG tablet Take 5 mg by mouth daily. 03/13/19  Yes [provider]  HYDROcodone-acetaminophen (NORCO/VICODIN) 5-325 MG tablet Take 1 tablet by mouth every 6 (six) hours as needed for severe pain. 09/23/20  Yes Wieting, Richard, MD  lactulose, encephalopathy, (CHRONULAC) 10 GM/15ML SOLN Take 10 g by mouth 2 (two) times daily as needed (mild constipation).   Yes [provider]  loperamide (IMODIUM A-D) 2 MG tablet Take 2 tablets (4 mg total) by mouth 4 (four) times daily as needed for diarrhea or loose stools. 09/09/19  Yes Carrie Mew, MD  loratadine (CLARITIN) 10 MG tablet Take 10 mg by mouth See admin instructions. Take 10 mg by mouth daily on Monday, Wednesday, Friday and Sunday   Yes [provider]  midodrine (PROAMATINE) 10 MG tablet Take 10 mg by mouth Every Tuesday,Thursday,and Saturday with dialysis.   Yes [provider]  midodrine (PROAMATINE) 5 MG tablet Take 5 mg by mouth daily.   Yes [provider]  mupirocin ointment (BACTROBAN) 2 % Place 1 application into the nose 2 (two) times daily. 09/23/20  Yes Wieting, Richard, MD  pregabalin (LYRICA) 75 MG capsule Take 75 mg  by mouth at bedtime.  11/02/17  Yes [provider]  rOPINIRole (REQUIP) 1 MG tablet Take 1 mg by mouth at bedtime. 03/13/19  Yes [provider]  sevelamer carbonate (RENVELA) 800 MG tablet Take 1,600 mg by mouth See admin instructions. Take 2 tablets ('1600mg'$ ) by mouth three times daily with meals on Monday, Wednesday, Friday and Sunday   Yes [provider]  sevelamer carbonate (RENVELA) 800 MG tablet Take 800 mg by mouth See admin instructions. Take 2 tablets ('1600mg'$ ) by mouth twice daily with meals (breakfast and dinner) on Tuesday, Thursday and Saturday   Yes [provider]  vitamin B-12 (CYANOCOBALAMIN) 1000 MCG tablet  Take 1,000 mcg by mouth daily.   Yes [provider]    Physical Exam: Vitals:   09/26/20 1848 09/26/20 1900 09/26/20 1904 09/26/20 1915  BP: (!) 92/53 (!) 96/49  97/65  Pulse: 76 (!) 111 99 85  Resp: '19 16 18 16  '$ Temp:      TempSrc:      SpO2: 100% 100% 100% 99%  Weight:         General:   shivering and appears uncomfortable Cardiovascular: Irregular rhythm regular rate, 2/6 systolic murmur left sternal border Respiratory:   CTA bilaterally with no wheezes/rales/rhonchi.  Normal respiratory effort. Abdomen:  soft, NT, ND, NABS Skin: Right groin area without hematoma or signs of infection.  Left buttock covered with Mepilex small sore/ulceration with no purulent drainage or fluctuance superficially Musculoskeletal:  grossly normal tone BUE/BLE, good ROM, no bony abnormality Lower extremity:  status post right BKA status post left AKA Psychiatric:  grossly normal mood and affect, speech fluent and appropriate, AOx3 Neurologic:  CN 2-12 grossly intact, moves all extremities in coordinated fashion, sensation intact    Radiological Exams on Admission: Independently reviewed - see discussion in A/P where applicable  DG Chest 2 View  Result Date: 09/26/2020 CLINICAL DATA:  chest pain EXAM: CHEST - 2 VIEW COMPARISON:  September 20, 2020 FINDINGS: The cardiomediastinal silhouette is unchanged and enlarged in contour. No pleural effusion. No pneumothorax. Persistent mild interstitial prominence peribronchial cuffing. No acute pleuroparenchymal abnormality. Visualized abdomen is unremarkable. Multilevel degenerative changes of the thoracic spine. IMPRESSION: Cardiomegaly with persistent mild interstitial prominence likely reflecting a degree of chronic underlying pulmonary edema. Electronically Signed   By: Valentino Saxon MD   On: 09/26/2020 13:12    EKG: Independently reviewed.  A. fib rate 91   Labs on Admission: I have personally reviewed the available labs and imaging studies at the time of the admission.  Pertinent labs: 141, creatinine 5.66, blood glucose 128, troponin 136, hemoglobin 8.6, platelets 107, hematocrit 27.2, potassium 6.3.     Assessment/Plan: Chest pain: The patient will be admitted to the stepdown unit under inpatient status.  Cardiology consulted and following.  Given heparin bolus initially and now on heparin drip.  Per cardiology no acute ST changes to suggest stent closure.  He is also chronically anticoagulated and therefore I have low suspicion for PE.  Per patient has been compliant with his DAPT and Eliquis.  Chills/rigors/low-grade fever with concern for infection: We will obtain a urinalysis via in an out cath. Still makes urine. WBC is normal.  Low-grade fevers noted.  Blood cultures are being drawn and we are starting empiric antibiotics with vancomycin and cefepime.  No obvious source of infection.  Left sore/ulceration does not have any purulent drainage upon my exam.  No superficial abscess upon my exam.  We will obtain an ultrasound to look for deep abscess.  Wound care nurse consulted.  History of recent NSTEMI and PCI: PTCA/DES OM. Continue aspirin and Plavix.  Hyperkalemia: Initial potassium 6.3.  The patient will be dialyzed now in the emergency department.  Nephrology consulted  for maintenance hemodialysis.  No EKG changes with peaked T waves therefore will not give calcium gluconate to stabilize the cardiac myocardium.  ESRD on HD TTHS: Plan as above.  Continue home Renvela.  Severe PAD status post left AKA and right BKA: Aspirin, Plavix and Lipitor  Permanent atrial fibrillation: The patient was on Coreg.  Cardiology has switched the patient to metoprolol.  Home Eliquis on  hold.  EKG shows rate controlled A. fib.  Chronic hypotension: Continue home midodrine  Obesity: No acute treatment.  BPH: Continue home Proscar.  Neuropathy: Continue home Lyrica.  Restless leg syndrome: Continue home Requip.   Level of Care: Stepdown DVT prophylaxis: Heparin drip Code Status: Full code Consults: Cardiology and nephrology Admission status: Inpatient   Leslee Home DO [facility]   How to contact the [facility] Attending or Consulting provider [place] or covering provider during after hours [place], for this patient?  Check the care team in [facility] and look for a) attending/consulting TR[facility] provider listed and b) the [facility] team listed Log into [email or web address] and use [place]'s universal password to access. If you do not have the password, please contact the hospital operator. Locate the [facility] provider you are looking for under [facility] and page to a number that you can be directly reached. If you still have difficulty reaching the provider, please page the [facility] (Director on Call) for the Hospitalists listed on amion for assistance.   09/26/2020, 7:29 PM

## 2020-09-26 NOTE — Consult Note (Signed)
[facility] CONSULT NOTE    Date: 09/26/2020                  Patient Name:  Antonio Phillips  MRN: [id number]  DOB: 11-22-1945  Age / Sex: 75 y.o., male         PCP: Marsh Dolly, MD                 Service Requesting Consult:                  Reason for Consult: ESRD             History of Present Illness: Patient is a  75 y/o M with medical history as above and including recent admission 7/17 - 7/20 with NSTEMI s/p cardiac catheterization with findings of single-vessel CAD with 99% stenosis of second marginal lesion which was stented who returns to the ED 7/23 with chest pain. He was scheduled for HD today but unable to do it due to symptoms of chills, shaking and chest pain. Denies any fever, chills or head aches.  Medications: Outpatient medications: (Not in a hospital admission)   Current medications: Current Facility-Administered Medications  Medication Dose Route Frequency Provider Last Rate Last Admin   heparin ADULT infusion 100 units/mL (25000 units/261m)  1,000 Units/hr Intravenous Continuous CBenita Gutter RPH       heparin bolus via infusion 2,000 Units  2,000 Units Intravenous [facility]      Current Outpatient Medications  Medication Sig Dispense Refill   acetaminophen (TYLENOL) 325 MG tablet Take 2 tablets (650 mg total) by mouth every 6 (six) hours as needed for mild pain or moderate pain.     apixaban (ELIQUIS) 5 MG TABS tablet Take 1 tablet (5 mg total) by mouth 2 (two) times daily. 60 tablet 0   aspirin 81 MG chewable tablet Chew 1 tablet (81 mg total) by mouth daily. 29 tablet 0   atorvastatin (LIPITOR) 40 MG tablet Take 40 mg by mouth at bedtime.     carvedilol (COREG) 3.125 MG tablet Take 1 tablet (3.125 mg total) by mouth 2 (two) times daily with a meal. 60 tablet 0   clopidogrel (PLAVIX) 75 MG tablet Take 1 tablet (75 mg total) by mouth daily with breakfast. 30 tablet 0   diclofenac Sodium (VOLTAREN) 1 %  GEL Apply 2 g topically See admin instructions. Apply 2 g to right stump once every morning     ENULOSE 10 GM/15ML SOLN Take 10 g by mouth 2 (two) times daily as needed (mild constipation).     finasteride (PROSCAR) 5 MG tablet Take 5 mg by mouth daily.     HYDROcodone-acetaminophen (NORCO/VICODIN) 5-325 MG tablet Take 1 tablet by mouth every 6 (six) hours as needed for severe pain. 10 tablet 0   loperamide (IMODIUM A-D) 2 MG tablet Take 2 tablets (4 mg total) by mouth 4 (four) times daily as needed for diarrhea or loose stools. 30 tablet 0   loratadine (CLARITIN) 10 MG tablet Take 10 mg by mouth See admin instructions. Take 10 mg by mouth once daily on Mondays, Wednesdays, Fridays, and Sundays     midodrine (PROAMATINE) 5 MG tablet Take 5-10 mg by mouth See admin instructions. Take 5 mg by mouth once daily at 0600. Give 10 mg by mouth once daily at 1000 on Tuesdays, Thursdays, and Saturdays     mupirocin ointment (BACTROBAN) 2 % Place 1 application into  the nose 2 (two) times daily. 22 g 0   pregabalin (LYRICA) 75 MG capsule Take 75 mg by mouth at bedtime.      REFRESH PLUS 0.5 % SOLN Apply 1 drop to eye 3 (three) times daily as needed (dry eyes).     rOPINIRole (REQUIP) 1 MG tablet Take 1 mg by mouth at bedtime.     sevelamer carbonate (RENVELA) 800 MG tablet Take 1,600 mg by mouth See admin instructions. Take 1,600 mg by mouth twice daily with breakfast and dinner on Tuesdays, Thursdays, and Saturdays. Take 1,600 mg by mouth three times daily with meals on Mondays, Wednesdays, Fridays, and Sundays.     vitamin B-12 (CYANOCOBALAMIN) 1000 MCG tablet Take 1,000 mcg by mouth daily.        Allergies: No Known Allergies    Past Medical History: Past Medical History:  Diagnosis Date   Demand ischemia ([place])    a. 11/2017 elev Trop in setting of AFib/SVT-->[facility]): EF>55%. Mild LVH. Nl RV fxn.   Diabetes ([place])    a. Pt states that he has no hx of diabetes--A1c 5.5 11/2018.   ESRD (end stage  renal disease) ([place])    a. On HD since 2019.   Hyperlipidemia    Hypotension    a. On midodrine.   Morbid obesity ([place])    PAD (peripheral artery disease) ([place])    a. s/p L AKA and R BKA.   Permanent atrial fibrillation ([place])    a. Dx 2019. CHA2DS2VASc = 3-4-->Eliquis.     Past Surgical History: Past Surgical History:  Procedure Laterality Date   AV FISTULA REPAIR     BELOW KNEE LEG AMPUTATION Bilateral    CHOLECYSTECTOMY     LEFT HEART CATH AND CORONARY ANGIOGRAPHY N/A 09/22/2020   Procedure: LEFT HEART CATH AND CORONARY ANGIOGRAPHY;  Surgeon: Sherren Mocha, MD;  Location: [facility];  Service: Cardiovascular;  Laterality: N/A;   TOTAL HIP ARTHROPLASTY Bilateral      Family History: No family history on file.   Social History: Social History   Socioeconomic History   Marital status: Single    Spouse name: Not on file   Number of children: Not on file   Years of education: Not on file   Highest education level: Not on file  Occupational History   Not on file  Tobacco Use   Smoking status: Never   Smokeless tobacco: Never  Substance and Sexual Activity   Alcohol use: Never   Drug use: Never   Sexual activity: Not on file  Other Topics Concern   Not on file  Social History Narrative   Lives @ local nsg facility.  Does not routinely exercise.   Social Determinants of Health   Financial Resource Strain: Not on file  Food Insecurity: Not on file  Transportation Needs: Not on file  Physical Activity: Not on file  Stress: Not on file  Social Connections: Not on file  Intimate Partner Violence: Not on file     Review of Systems: As per HPI  Vital Signs: Blood pressure 136/90, pulse (!) 117, temperature 99.6 F (37.6 C), temperature source Rectal, resp. rate 20, weight 98 kg, SpO2 94 %.  Weight trends: Filed Weights   09/26/20 1230  Weight: 98 kg    Physical Exam: Physical Exam: General:  No acute distress  Head:  Normocephalic,  atraumatic. Moist oral mucosal membranes  Eyes:  Anicteric  Neck:  Supple  Lungs:   Clear to auscultation,  normal effort  Heart:  S1S2 no rubs  Abdomen:   Soft, nontender, bowel sounds present  Extremities:  peripheral edema.  Neurologic:  Awake, alert, following commands  Skin:  No lesions  Access:     Lab results:  Basic Metabolic Panel: Recent Labs  Lab 09/20/20 1712 09/21/20 0505 09/22/20 0055 09/23/20 0413 09/26/20 1233  NA 134* 135 133* 134* 137  K 4.7 4.6 5.3* 4.9 4.6  CL 101 101 99 95* 102  CO2 '23 24 22 27 26  '$ GLUCOSE 143* 111* 125* 116* 128*  BUN 44* 50* 54* 34* 41*  CREATININE 5.69* 6.45* 6.74* 4.84* 5.66*  CALCIUM 8.9 8.8* 8.4* 8.8* 8.6*    CBC: Recent Labs  Lab 09/20/20 1712 09/21/20 0505 09/22/20 0055 09/23/20 0413 09/26/20 1233  WBC 5.2 4.6 4.0 5.3 5.7  HGB 9.2* 8.9* 8.3* 8.6* 8.6*  HCT 29.6* 29.0* 26.1* 27.3* 27.2*  MCV 96.4 96.0 96.3 95.5 99.3  PLT 103* 98* 96* 106* 107*    Microbiology: Results for orders placed or performed during the hospital encounter of 09/20/20  SARS CORONAVIRUS 2 (TAT 6-24 HRS) Nasopharyngeal Nasopharyngeal Swab     Status: None   Collection Time: 09/20/20  9:27 PM   Specimen: Nasopharyngeal Swab  Result Value Ref Range Status   SARS Coronavirus 2 NEGATIVE NEGATIVE Final    Comment: (NOTE) SARS-CoV-2 target nucleic acids are NOT DETECTED.  The SARS-CoV-2 RNA is generally detectable in upper and lower respiratory specimens during the acute phase of infection. Negative results do not preclude SARS-CoV-2 infection, do not rule out co-infections with other pathogens, and should not be used as the sole basis for treatment or other patient management decisions. Negative results must be combined with clinical observations, patient history, and epidemiological information. The expected result is Negative.  Fact Sheet for Patients: SugarRoll.be  Fact Sheet for Healthcare  Providers: https://www.woods-mathews.com/  This test is not yet approved or cleared by the [place] FDA and  has been authorized for detection and/or diagnosis of SARS-CoV-2 by FDA under an Emergency Use Authorization (EUA). This EUA will remain  in effect (meaning this test can be used) for the duration of the COVID-19 declaration under Se ction 564(b)(1) of the Act, 21 U.S.C. section 360bbb-3(b)(1), unless the authorization is terminated or revoked sooner.  Performed at [facility], [place] [address], [place], [place] [address]   MRSA Next Gen by PCR, Nasal     Status: Abnormal   Collection Time: 09/21/20  1:20 PM   Specimen: Nasal Mucosa; Nasal Swab  Result Value Ref Range Status   MRSA by PCR Next Gen DETECTED (A) NOT DETECTED Final    Comment: CRITICAL RESULT CALLED TO, READ BACK BY AND VERIFIED WITH: Arnetha Courser [id number] 09/21/20 GM (NOTE) The GeneXpert MRSA Assay (FDA approved for NASAL specimens only), is one component of a comprehensive MRSA colonization surveillance program. It is not intended to diagnose MRSA infection nor to guide or monitor treatment for MRSA infections. Test performance is not FDA approved in patients less than 38 years old. Performed at [facility], [address] [place], [place] [address]     Urinalysis: No results for input(s): COLORURINE, LABSPEC, PHURINE, GLUCOSEU, HGBUR, BILIRUBINUR, KETONESUR, PROTEINUR, UROBILINOGEN, NITRITE, LEUKOCYTESUR in the last 72 hours.  Invalid input(s): APPERANCEUR   Imaging:  DG Chest 2 View  Result Date: 09/26/2020 CLINICAL DATA:  chest pain EXAM: CHEST - 2 VIEW COMPARISON:  September 20, 2020 FINDINGS: The cardiomediastinal silhouette is unchanged and  enlarged in contour. No pleural effusion. No pneumothorax. Persistent mild interstitial prominence peribronchial cuffing. No acute pleuroparenchymal abnormality. Visualized abdomen is unremarkable. Multilevel degenerative changes of  the thoracic spine. IMPRESSION: Cardiomegaly with persistent mild interstitial prominence likely reflecting a degree of chronic underlying pulmonary edema. Electronically Signed   By: Valentino Saxon MD   On: 09/26/2020 13:12     Assessment & Plan:   Active Problems:  Chest pain.   ESRD, will attempt dialysis today. HTN/CAD: S/P stent placement recently. Now need to be ruled out for acute event. Cardiology evaluation in progress.  Anemia, will monitor and continue ESRD anemia protocols.   Case discussed with Dr. Ellender Hose, patient may need to be monitored in the ICU setting.  Dialysis unit notified. Will follow closely.   LOS: 0 Lyla Son, MD [facility]. 7/23/20223:14 PM

## 2020-09-26 NOTE — Progress Notes (Signed)
Pt end at 2055. 1L UF goal met. Pt no c/o, alert, stable, vss, sites de accessed w/ no complications and report to ED RN.

## 2020-09-26 NOTE — Consult Note (Signed)
Cardiology Consultation:   Patient ID: TEVIN KRITIKOS MRN: [id number]; DOB: Dec 31, 1945  Admit date: 09/26/2020 Date of Consult: 09/26/2020  PCP:  Marsh Dolly, MD   [facility] Providers Cardiologist:  Ida Rogue, MD   { Patient Profile:   ABHI QUINT is a 75 y.o. male with a hx of CAD  who is being seen 09/26/2020 for the evaluation of CP at the request of Dr Louis Matte.  History of Present Illness:   Mr. Eiche is a 75 yo with hx of CAD, PAD (s/p bilat amputations), permanent atiral fib, ESRD (on HD), morbid obesity, DM     The pt was recently admitted with CP and elevated troponin  Echo showed LVEF 60 to 65%   L hart cath showed significan obstruction to OM2.  Underwent PTCA / DEs    Plan for ASA/PLAVIX/ELIQUS for 6 months then ASA/ ELiquis  Pt went home on 09/23/20  The pt  was due for hemodialysis but unable to due because of chills, shaking and CP   Per Pt and hospitalist pt complained of R sided then center of chest pressure   Had this prior to stent   Again, now with F/C and rigors   No Cough   Does have sore on bottom     Past Medical History:  Diagnosis Date   Demand ischemia ([place])    a. 11/2017 elev Trop in setting of AFib/SVT-->Echo [facility]): EF>55%. Mild LVH. Nl RV fxn.   Diabetes ([place])    a. Pt states that he has no hx of diabetes--A1c 5.5 11/2018.   ESRD (end stage renal disease) ([place])    a. On HD since 2019.   Hyperlipidemia    Hypotension    a. On midodrine.   Morbid obesity ([place])    PAD (peripheral artery disease) ([place])    a. s/p L AKA and R BKA.   Permanent atrial fibrillation ([place])    a. Dx 2019. CHA2DS2VASc = 3-4-->Eliquis.    Past Surgical History:  Procedure Laterality Date   AV FISTULA REPAIR     BELOW KNEE LEG AMPUTATION Bilateral    CHOLECYSTECTOMY     LEFT HEART CATH AND CORONARY ANGIOGRAPHY N/A 09/22/2020   Procedure: LEFT HEART CATH AND CORONARY ANGIOGRAPHY;  Surgeon: Sherren Mocha, MD;  Location: [place] [facility];  Service:  Cardiovascular;  Laterality: N/A;   TOTAL HIP ARTHROPLASTY Bilateral        Inpatient Medications: Scheduled Meds:  heparin  2,000 Units Intravenous Once   Continuous Infusions:  heparin     PRN Meds:   Allergies:   No Known Allergies  Social History:   Social History   Socioeconomic History   Marital status: Single    Spouse name: Not on file   Number of children: Not on file   Years of education: Not on file   Highest education level: Not on file  Occupational History   Not on file  Tobacco Use   Smoking status: Never   Smokeless tobacco: Never  Substance and Sexual Activity   Alcohol use: Never   Drug use: Never   Sexual activity: Not on file  Other Topics Concern   Not on file  Social History Narrative   Lives @ local nsg facility.  Does not routinely exercise.   Social Determinants of Health   Financial Resource Strain: Not on file  Food Insecurity: Not on file  Transportation Needs: Not on file  Physical Activity: Not on file  Stress: Not on  file  Social Connections: Not on file  Intimate Partner Violence: Not on file    Family History:   No family history on file.   ROS:  Please see the history of present illness.   All other ROS reviewed and negative.     Physical Exam/Data:   Vitals:   09/26/20 1300 09/26/20 1345 09/26/20 1400 09/26/20 1445  BP: (!) 102/55  118/68 (!) 145/64  Pulse: 75 73  (!) 134  Resp: '14 12 13 18  '$ Temp:    98.4 F (36.9 C)  TempSrc:    Oral  SpO2: 94% 96%  97%  Weight:       No intake or output data in the 24 hours ending 09/26/20 1509 Last 3 Weights 09/26/2020 09/22/2020 09/20/2020  Weight (lbs) 216 lb 0.8 oz 218 lb 0.6 oz 220 lb  Weight (kg) 98 kg 98.9 kg 99.791 kg     Body mass index is 30.13 kg/m.  General:  Obese 75 yo who appears uncomfortbable   Shivering  HEENT: normal  Neck: Neck is full   Difficult to asses JVD    Cardiac:  normal S1, S2; RRR; II/VI systolic murmur LSB    Lungs: Moving air   MIld  wheezes   Abd: soft, nontender, no hepatomegaly  Ext: No signif edema   Musculoskeletal:  s/p R BKA; s/p L AKA    R groin without hemotoma  Skin: warm and dry  Neuro:  CNs 2-12 intact, Moving extremities   Psych:  Deferred   Pt uncomfortable   EKG:  The EKG was personally reviewed and demonstrates:  Atrial fibrillation   91 bpm  Nonspecific ST changes    Telemetry:  Telemetry was personally reviewed and demonstrates:  Afib   Relevant CV Studies: Cardiac Cath 09/22/20       Ost LAD to Prox LAD lesion is 50% stenosed.   2nd Mrg lesion is 99% stenosed.   A drug-eluting stent was successfully placed using a STENT RESOLUTE ONYX 2.5X15.   Post intervention, there is a 30% residual stenosis.   1.  Severe single-vessel coronary artery disease with 99% subtotal stenosis of the second obtuse marginal branch, treated with a 2.5 x 15 mm resolute Onyx DES.  30% residual stenosis at the case completion due to resistant/calcified lesion type even with high-pressure noncompliant balloon angioplasty. 2.  Mild to moderate nonobstructive proximal LAD stenosis 3.  Mild nonobstructive RCA stenosis  Recommendations: Aggressive medical therapy, as long as no bleeding complications arise, would resume apixaban tomorrow, aspirin 81 mg daily x30 days, and clopidogrel 75 mg daily x minimum 6 months.   Echo  09/21/20  1. Left ventricular ejection fraction, by estimation, is 60 to 65%. The left ventricle has normal function. The left ventricle has no regional wall motion abnormalities. Left ventricular diastolic parameters are indeterminate. 2. Right ventricular systolic function is normal. The right ventricular size is normal. Tricuspid regurgitation signal is inadequate for assessing PA pressure. 3. Left atrial size was moderately dilated. 4. Right atrial size was moderately dilated. 5. Tricuspid valve regurgitation is mild to moderate. 6. The mitral valve is normal in structure. Mild mitral valve  regurgitation. 7. Rhythym is atrial fibrillation   Laboratory Data:  High Sensitivity Troponin:   Recent Labs  Lab 09/20/20 1712 09/20/20 1900 09/26/20 1233  TROPONINIHS 338* 296* 136*     Chemistry Recent Labs  Lab 09/22/20 0055 09/23/20 0413 09/26/20 1233  NA 133* 134* 137  K 5.3* 4.9 4.6  CL 99 95* 102  CO2 '22 27 26  '$ GLUCOSE 125* 116* 128*  BUN 54* 34* 41*  CREATININE 6.74* 4.84* 5.66*  CALCIUM 8.4* 8.8* 8.6*  GFRNONAA 8* 12* 10*  ANIONGAP '12 12 9    '$ No results for input(s): PROT, ALBUMIN, AST, ALT, ALKPHOS, BILITOT in the last 168 hours. Hematology Recent Labs  Lab 09/22/20 0055 09/23/20 0413 09/26/20 1233  WBC 4.0 5.3 5.7  RBC 2.71* 2.86* 2.74*  HGB 8.3* 8.6* 8.6*  HCT 26.1* 27.3* 27.2*  MCV 96.3 95.5 99.3  MCH 30.6 30.1 31.4  MCHC 31.8 31.5 31.6  RDW 16.9* 17.2* 17.2*  PLT 96* 106* 107*   BNPNo results for input(s): BNP, PROBNP in the last 168 hours.  DDimer  Recent Labs  Lab 09/20/20 1712  DDIMER 0.60*     Radiology/Studies:  DG Chest 2 View  Result Date: 09/26/2020 CLINICAL DATA:  chest pain EXAM: CHEST - 2 VIEW COMPARISON:  September 20, 2020 FINDINGS: The cardiomediastinal silhouette is unchanged and enlarged in contour. No pleural effusion. No pneumothorax. Persistent mild interstitial prominence peribronchial cuffing. No acute pleuroparenchymal abnormality. Visualized abdomen is unremarkable. Multilevel degenerative changes of the thoracic spine. IMPRESSION: Cardiomegaly with persistent mild interstitial prominence likely reflecting a degree of chronic underlying pulmonary edema. Electronically Signed   By: Valentino Saxon MD   On: 09/26/2020 13:12     Assessment and Plan:   Chest pain    Pt with recent NSTEMI and PTCA/DES to OM  Presented to dialysis today with CP but also with chills, shivering    Brought to [facility] ED   EKG shows atrial fibrillation.  No acute ST changes to suggest stent closure     He contirnues to shiver, rigor some   Troponins mildly elevated , relatively flat     For now I would rx medially  HR and BP control with b blockers Stop Eliquis   Start Heparin for now  COntinue ASA and Plavix  2  Shivering   Low Grade fever (99.6)   WBC is normal   Pt pt is rigoring   HR is elevated   Blood cultures being drawn and ABX being started empirically     No obvious source  3  Atrial fibrillation  COntinue rate control and use heparin  The pt was on coreg   I would use metoprolol for now to avoid hypotension    4  HL  keep on Lipitor  5  Hx of hypotension  Pt has been on midoring  priot to dialysis and 5 mg daily   Continue    6  ESRD  Plan for dialysis today     Will continue to follow   For questions or updates, please contact [place] Please consult [email or web address] for contact info under    Signed, Dorris Carnes, MD  09/26/2020 3:09 PM

## 2020-09-26 NOTE — Progress Notes (Signed)
Pre HD info 

## 2020-09-26 NOTE — ED Notes (Signed)
Messaged attending provider regarding current vital signs, ordered bolus of fluids given - see mar. Pt remains asymptomatic, watching tv and denies needs. Awaiting orders.

## 2020-09-27 ENCOUNTER — Inpatient Hospital Stay (HOSPITAL_COMMUNITY)
Admit: 2020-09-27 | Discharge: 2020-09-27 | Disposition: A | Discharge status: Home / Self Care | Payer: No Typology Code available for payment source | Attending: Medical | Admitting: Medical

## 2020-09-27 DIAGNOSIS — R7881 Bacteremia: Secondary | ICD-10-CM

## 2020-09-27 DIAGNOSIS — N186 End stage renal disease: Secondary | ICD-10-CM | POA: Diagnosis not present

## 2020-09-27 DIAGNOSIS — R079 Chest pain, unspecified: Secondary | ICD-10-CM

## 2020-09-27 DIAGNOSIS — I4891 Unspecified atrial fibrillation: Secondary | ICD-10-CM | POA: Diagnosis not present

## 2020-09-27 DIAGNOSIS — E785 Hyperlipidemia, unspecified: Secondary | ICD-10-CM | POA: Diagnosis not present

## 2020-09-27 DIAGNOSIS — T148XXA Other injury of unspecified body region, initial encounter: Secondary | ICD-10-CM | POA: Diagnosis not present

## 2020-09-27 LAB — BLOOD CULTURE ID PANEL (REFLEXED) - BCID2

## 2020-09-27 LAB — RENAL FUNCTION PANEL
Albumin: 2.7 g/dL — ABNORMAL LOW (ref 3.5–5.0)
Anion gap: 11 (ref 5–15)
BUN: 26 mg/dL — ABNORMAL HIGH (ref 8–23)
CO2: 25 mmol/L (ref 22–32)
Calcium: 8.6 mg/dL — ABNORMAL LOW (ref 8.9–10.3)
Chloride: 99 mmol/L (ref 98–111)
Creatinine, Ser: 4.11 mg/dL — ABNORMAL HIGH (ref 0.61–1.24)
GFR, Estimated: 14 mL/min — ABNORMAL LOW (ref >60)
Glucose, Bld: 113 mg/dL — ABNORMAL HIGH (ref 70–99)
Phosphorus: 3.7 mg/dL (ref 2.5–4.6)
Potassium: 3.8 mmol/L (ref 3.5–5.1)
Sodium: 135 mmol/L (ref 135–145)

## 2020-09-27 LAB — IRON AND TIBC
Iron: 38 ug/dL — ABNORMAL LOW (ref 45–182)
Saturation Ratios: 23 % (ref 17.9–39.5)
TIBC: 162 ug/dL — ABNORMAL LOW (ref 250–450)
UIBC: 124 ug/dL

## 2020-09-27 LAB — CBC
HCT: 24.5 % — ABNORMAL LOW (ref 39.0–52.0)
Hemoglobin: 7.8 g/dL — ABNORMAL LOW (ref 13.0–17.0)
MCH: 31.7 pg (ref 26.0–34.0)
MCHC: 31.8 g/dL (ref 30.0–36.0)
MCV: 99.6 fL (ref 80.0–100.0)
Platelets: 96 10*3/uL — ABNORMAL LOW (ref 150–400)
RBC: 2.46 MIL/uL — ABNORMAL LOW (ref 4.22–5.81)
RDW: 17.6 % — ABNORMAL HIGH (ref 11.5–15.5)
WBC: 5.9 10*3/uL (ref 4.0–10.5)
nRBC: 0 % (ref 0.0–0.2)

## 2020-09-27 LAB — ECHOCARDIOGRAM LIMITED
AR max vel: 1.25 cm2
AV Peak grad: 11.8 mmHg
Ao pk vel: 1.72 m/s
Area-P 1/2: 4.17 cm2
S' Lateral: 3.4 cm
Weight: 3456.81 oz

## 2020-09-27 LAB — URINALYSIS, COMPLETE (UACMP) WITH MICROSCOPIC
Bacteria, UA: NONE SEEN
Bilirubin Urine: NEGATIVE
Glucose, UA: 150 mg/dL — AB
Ketones, ur: NEGATIVE mg/dL
Nitrite: NEGATIVE
Protein, ur: 100 mg/dL — AB
Specific Gravity, Urine: 1.009 (ref 1.005–1.030)
pH: 9 — ABNORMAL HIGH (ref 5.0–8.0)

## 2020-09-27 LAB — APTT: aPTT: 73 seconds — ABNORMAL HIGH (ref 24–36)

## 2020-09-27 LAB — VITAMIN B12: Vitamin B-12: 1085 pg/mL — ABNORMAL HIGH (ref 180–914)

## 2020-09-27 LAB — FERRITIN: Ferritin: 1692 ng/mL — ABNORMAL HIGH (ref 24–336)

## 2020-09-27 LAB — RETICULOCYTES
Immature Retic Fract: 35.8 % — ABNORMAL HIGH (ref 2.3–15.9)
RBC.: 2.52 MIL/uL — ABNORMAL LOW (ref 4.22–5.81)
Retic Count, Absolute: 50.1 10*3/uL (ref 19.0–186.0)
Retic Ct Pct: 2 % (ref 0.4–3.1)

## 2020-09-27 LAB — PROCALCITONIN: Procalcitonin: 7.15 ng/mL

## 2020-09-27 LAB — HEPATITIS B SURFACE ANTIBODY,QUALITATIVE: Hep B S Ab: NONREACTIVE

## 2020-09-27 LAB — CBG MONITORING, ED: Glucose-Capillary: 122 mg/dL — ABNORMAL HIGH (ref 70–99)

## 2020-09-27 LAB — RESP PANEL BY RT-PCR (FLU A&B, COVID) ARPGX2
Influenza A by PCR: NEGATIVE
Influenza B by PCR: NEGATIVE
SARS Coronavirus 2 by RT PCR: NEGATIVE

## 2020-09-27 LAB — FOLATE: Folate: 10.4 ng/mL (ref >5.9)

## 2020-09-27 LAB — HEPATITIS B SURFACE ANTIGEN: Hepatitis B Surface Ag: NONREACTIVE

## 2020-09-27 MED ORDER — APIXABAN 5 MG PO TABS
5.0000 mg | ORAL_TABLET | Freq: Two times a day (BID) | ORAL | Status: DC
  Administered 2020-09-28 – 2020-09-30 (×4): 5 mg via ORAL
  Filled 2020-09-27 (×4): qty 1

## 2020-09-27 MED ORDER — SODIUM CHLORIDE 0.9 % IV SOLN
2.0000 g | INTRAVENOUS | Status: DC
  Administered 2020-09-27 – 2020-09-30 (×4): 2 g via INTRAVENOUS
  Filled 2020-09-27: qty 20
  Filled 2020-09-27: qty 2
  Filled 2020-09-27: qty 20
  Filled 2020-09-27: qty 2

## 2020-09-27 MED ORDER — DICLOFENAC SODIUM 1 % EX GEL
2.0000 g | Freq: Three times a day (TID) | CUTANEOUS | Status: DC | PRN
  Administered 2020-09-27 – 2020-09-30 (×3): 2 g via TOPICAL
  Filled 2020-09-27 (×2): qty 100

## 2020-09-27 MED ORDER — METOPROLOL TARTRATE 25 MG PO TABS
12.5000 mg | ORAL_TABLET | Freq: Two times a day (BID) | ORAL | Status: DC
  Administered 2020-09-27 – 2020-09-30 (×5): 12.5 mg via ORAL
  Filled 2020-09-27 (×5): qty 1

## 2020-09-27 NOTE — ED Notes (Signed)
Pt assisted onto bedpan.

## 2020-09-27 NOTE — Consult Note (Signed)
[place] for IV Heparin Indication: chest pain/ACS  Patient Measurements: Heparin Dosing Weight: 95 kg  Labs: Recent Labs    09/26/20 1233 09/26/20 1440 09/26/20 1542 09/27/20 0145  HGB 8.6*  --  10.4*  --   HCT 27.2*  --  33.7*  --   PLT 107*  --  139*  --   APTT 44*  --   --  73*  LABPROT 20.1*  --   --   --   INR 1.7*  --   --   --   HEPARINUNFRC >1.10*  --   --   --   CREATININE 5.66*  --  6.00*  --   TROPONINIHS 136* 144*  --   --      Estimated Creatinine Clearance: 12.7 mL/min (A) (by C-G formula based on SCr of 6 mg/dL (H)).   Medical History: Past Medical History:  Diagnosis Date   Demand ischemia ([place])    a. 11/2017 elev Trop in setting of AFib/SVT-->Echo [facility]): EF>55%. Mild LVH. Nl RV fxn.   Diabetes ([place])    a. Pt states that he has no hx of diabetes--A1c 5.5 11/2018.   ESRD (end stage renal disease) ([place])    a. On HD since 2019.   Hyperlipidemia    Hypotension    a. On midodrine.   Morbid obesity ([place])    PAD (peripheral artery disease) ([place])    a. s/p L AKA and R BKA.   Permanent atrial fibrillation ([place])    a. Dx 2019. CHA2DS2VASc = 3-4-->Eliquis.    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 There is concern for possible stent thrombosis. Troponin 136. Pharmacy consulted to initiate heparin infusion for suspected ACS / possible stent thrombosis.   Baseline CBC notable for Hgb 8.6, platelets 106 which is consistent with recent admission. Baseline aPTT 44s, INR 1.7, HL > 1.1  Goal of Therapy:  Heparin level 0.3-0.7 units/ml aPTT 66-102 seconds Monitor platelets by anticoagulation protocol: Yes    Plan:  7/24:  HL @ [id number] = 73, therapeutic X 1  Will continue pt on current rate and recheck aPTT and HL in 8 hrs on 7/24 @ 1000.  Erlinda Solinger D 09/27/2020,2:18 AM

## 2020-09-27 NOTE — Progress Notes (Signed)
PHARMACY - PHYSICIAN COMMUNICATION CRITICAL VALUE ALERT - BLOOD CULTURE IDENTIFICATION (BCID)  Antonio Phillips is an 75 y.o. male who presented to [facility] on 09/26/2020 with a chief complaint of Chest pain, infection of unknown source.   Assessment:  E. Coli in 1 of 4 bottles , no resistance detected.  (include suspected source if known)  Name of physician (or Provider) Contacted: Prudy Feeler   Current antibiotics: Vanc, cefepime   Changes to prescribed antibiotics recommended:  Yes, will d/c vanc, cefepime and start ceftriaxone 2 gm IV Q24H.   Results for orders placed or performed during the hospital encounter of 09/26/20  Blood Culture ID Panel (Reflexed) (Collected: 09/26/2020  4:06 PM)  Result Value Ref Range   Enterococcus faecalis NOT DETECTED NOT DETECTED   Enterococcus Faecium NOT DETECTED NOT DETECTED   Listeria monocytogenes NOT DETECTED NOT DETECTED   Staphylococcus species NOT DETECTED NOT DETECTED   Staphylococcus aureus (BCID) NOT DETECTED NOT DETECTED   Staphylococcus epidermidis NOT DETECTED NOT DETECTED   Staphylococcus lugdunensis NOT DETECTED NOT DETECTED   Streptococcus species NOT DETECTED NOT DETECTED   Streptococcus agalactiae NOT DETECTED NOT DETECTED   Streptococcus pneumoniae NOT DETECTED NOT DETECTED   Streptococcus pyogenes NOT DETECTED NOT DETECTED   A.calcoaceticus-baumannii NOT DETECTED NOT DETECTED   Bacteroides fragilis NOT DETECTED NOT DETECTED   Enterobacterales DETECTED (A) NOT DETECTED   Enterobacter cloacae complex NOT DETECTED NOT DETECTED   Escherichia coli DETECTED (A) NOT DETECTED   Klebsiella aerogenes NOT DETECTED NOT DETECTED   Klebsiella oxytoca NOT DETECTED NOT DETECTED   Klebsiella pneumoniae NOT DETECTED NOT DETECTED   Proteus species NOT DETECTED NOT DETECTED   Salmonella species NOT DETECTED NOT DETECTED   Serratia marcescens NOT DETECTED NOT DETECTED   Haemophilus influenzae NOT DETECTED NOT DETECTED   Neisseria  meningitidis NOT DETECTED NOT DETECTED   Pseudomonas aeruginosa NOT DETECTED NOT DETECTED   Stenotrophomonas maltophilia NOT DETECTED NOT DETECTED   Candida albicans NOT DETECTED NOT DETECTED   Candida auris NOT DETECTED NOT DETECTED   Candida glabrata NOT DETECTED NOT DETECTED   Candida krusei NOT DETECTED NOT DETECTED   Candida parapsilosis NOT DETECTED NOT DETECTED   Candida tropicalis NOT DETECTED NOT DETECTED   Cryptococcus neoformans/gattii NOT DETECTED NOT DETECTED   CTX-M ESBL NOT DETECTED NOT DETECTED   Carbapenem resistance IMP NOT DETECTED NOT DETECTED   Carbapenem resistance KPC NOT DETECTED NOT DETECTED   Carbapenem resistance NDM NOT DETECTED NOT DETECTED   Carbapenem resist OXA 48 LIKE NOT DETECTED NOT DETECTED   Carbapenem resistance VIM NOT DETECTED NOT DETECTED    Marcia Hartwell D 09/27/2020  4:35 AM

## 2020-09-27 NOTE — Progress Notes (Signed)
*  PRELIMINARY RESULTS* Echocardiogram 2D Echocardiogram has been performed.  Antonio Phillips [name] 09/27/2020, 2:21 PM

## 2020-09-27 NOTE — ED Notes (Signed)
RN at bedside. Pt had BM. Pt cleaned. Clean chux placed under pt.

## 2020-09-27 NOTE — ED Notes (Signed)
This RN spoke with [facility] on POC for pt.

## 2020-09-27 NOTE — ED Notes (Signed)
Took over care of patient, has a soft bp after dialysis, is getting a 500 lr bolus

## 2020-09-27 NOTE — ED Notes (Signed)
Pt requesting Voltaren gel for bilateral legs. MD messaged due to no active order for medication.

## 2020-09-27 NOTE — ED Notes (Signed)
Patient is resting comfortably. Watching TV

## 2020-09-27 NOTE — Progress Notes (Addendum)
Progress Note  Patient Name: Antonio Phillips Date of Encounter: 09/27/2020  [facility] Cardiologist: Ida Rogue, MD   Subjective   Patient he is feeling better today. No further chest pain. Breathing is stable. Remains in rate controlled afib.   Inpatient Medications    Scheduled Meds:  apixaban  5 mg Oral BID   aspirin  81 mg Oral Daily   atorvastatin  40 mg Oral QHS   Chlorhexidine Gluconate Cloth  6 each Topical Q0600   clopidogrel  75 mg Oral Q breakfast   finasteride  5 mg Oral Daily   loratadine  10 mg Oral Once per day on Sun Mon Wed Fri   metoprolol tartrate  12.5 mg Oral BID   [START ON 09/29/2020] midodrine  10 mg Oral Q T,Th,Sa-HD   midodrine  5 mg Oral Daily   pregabalin  75 mg Oral QHS   rOPINIRole  1 mg Oral QHS   sevelamer carbonate  1,600 mg Oral 3 times per day on Sun Mon Wed Fri   sevelamer carbonate  800 mg Oral 2 times per day on Tue Thu Sat   vitamin B-12  1,000 mcg Oral Daily   Continuous Infusions:  sodium chloride 100 mL (09/26/20 2229)   sodium chloride     cefTRIAXone (ROCEPHIN)  IV Stopped (09/27/20 0710)   PRN Meds: sodium chloride, sodium chloride, acetaminophen **OR** acetaminophen, alteplase, diclofenac Sodium, heparin, HYDROcodone-acetaminophen, lidocaine (PF), lidocaine-prilocaine, morphine injection, ondansetron **OR** ondansetron (ZOFRAN) IV, pentafluoroprop-tetrafluoroeth, polyethylene glycol, polyvinyl alcohol   Vital Signs    Vitals:   09/27/20 0700 09/27/20 0730 09/27/20 0800 09/27/20 0830  BP: (!) '92/56 95/60 93/62 '$ (!) 105/58  Pulse: 82 72 83 76  Resp: '13 13 15 14  '$ Temp:      TempSrc:      SpO2: 100% 100% 100% 100%  Weight:        Intake/Output Summary (Last 24 hours) at 09/27/2020 1034 Last data filed at 09/27/2020 0710 Gross per 24 hour  Intake 1214.01 ml  Output 1000 ml  Net 214.01 ml   Last 3 Weights 09/26/2020 09/22/2020 09/20/2020  Weight (lbs) 216 lb 0.8 oz 218 lb 0.6 oz 220 lb  Weight (kg) 98 kg 98.9  kg 99.791 kg      Telemetry    Afib HR 80s - Personally Reviewed  ECG    No new - Personally Reviewed  Physical Exam   GEN: No acute distress.   Neck: No JVD Cardiac: Irreg Irreg, no murmurs, rubs, or gallops.  Respiratory: Clear to auscultation bilaterally. GI: Soft, nontender, non-distended  MS: No edema; b/l amputee Neuro:  Nonfocal  Psych: Normal affect   Labs    High Sensitivity Troponin:   Recent Labs  Lab 09/20/20 1712 09/20/20 1900 09/26/20 1233 09/26/20 1440  TROPONINIHS 338* 296* 136* 144*      Chemistry Recent Labs  Lab 09/26/20 1233 09/26/20 1542 09/27/20 0707  NA 137 135 135  K 4.6 6.3* 3.8  CL 102 98 99  CO2 26 21* 25  GLUCOSE 128* 101* 113*  BUN 41* 41* 26*  CREATININE 5.66* 6.00* 4.11*  CALCIUM 8.6* 9.3 8.6*  ALBUMIN  --  3.6 2.7*  GFRNONAA 10* 9* 14*  ANIONGAP 9 16* 11     Hematology Recent Labs  Lab 09/26/20 1233 09/26/20 1542 09/27/20 0707  WBC 5.7 8.3 5.9  RBC 2.74* 3.40* 2.46*  2.52*  HGB 8.6* 10.4* 7.8*  HCT 27.2* 33.7* 24.5*  MCV 99.3 99.1  99.6  MCH 31.4 30.6 31.7  MCHC 31.6 30.9 31.8  RDW 17.2* 17.3* 17.6*  PLT 107* 139* 96*    BNPNo results for input(s): BNP, PROBNP in the last 168 hours.   DDimer  Recent Labs  Lab 09/20/20 1712  DDIMER 0.60*     Radiology    DG Chest 2 View  Result Date: 09/26/2020 CLINICAL DATA:  chest pain EXAM: CHEST - 2 VIEW COMPARISON:  September 20, 2020 FINDINGS: The cardiomediastinal silhouette is unchanged and enlarged in contour. No pleural effusion. No pneumothorax. Persistent mild interstitial prominence peribronchial cuffing. No acute pleuroparenchymal abnormality. Visualized abdomen is unremarkable. Multilevel degenerative changes of the thoracic spine. IMPRESSION: Cardiomegaly with persistent mild interstitial prominence likely reflecting a degree of chronic underlying pulmonary edema. Electronically Signed   By: Valentino Saxon MD   On: 09/26/2020 13:12   [place] LT LOWER EXTREM  LTD SOFT TISSUE NON VASCULAR  Result Date: 09/26/2020 CLINICAL DATA:  Wound on left buttock, concern for abscess EXAM: ULTRASOUND LEFT LOWER EXTREMITY LIMITED TECHNIQUE: Ultrasound examination of the lower extremity soft tissues was performed in the area of clinical concern. COMPARISON:  None. FINDINGS: Targeted grayscale and color Doppler ultrasound evaluation was performed at the site of reported wound in the region of the left buttock. Mild superficial soft tissue edematous changes are noted. No discrete abscess, collection or subcutaneous mass is identified. IMPRESSION: Targeted ultrasound evaluation of the indicated area of concern reveals mild induration and subcutaneous edema without subjacent collection, abscess or mass. Electronically Signed   By: Lovena Le M.D.   On: 09/26/2020 20:08    Cardiac Studies   Cardiac Cath 09/22/20         Ost LAD to Prox LAD lesion is 50% stenosed.   2nd Mrg lesion is 99% stenosed.   A drug-eluting stent was successfully placed using a STENT RESOLUTE ONYX 2.5X15.   Post intervention, there is a 30% residual stenosis.   1.  Severe single-vessel coronary artery disease with 99% subtotal stenosis of the second obtuse marginal branch, treated with a 2.5 x 15 mm resolute Onyx DES.  30% residual stenosis at the case completion due to resistant/calcified lesion type even with high-pressure noncompliant balloon angioplasty. 2.  Mild to moderate nonobstructive proximal LAD stenosis 3.  Mild nonobstructive RCA stenosis  Recommendations: Aggressive medical therapy, as long as no bleeding complications arise, would resume apixaban tomorrow, aspirin 81 mg daily x30 days, and clopidogrel 75 mg daily x minimum 6 months.     Echo  09/21/20   1. Left ventricular ejection fraction, by estimation, is 60 to 65%. The left ventricle has normal function. The left ventricle has no regional wall motion abnormalities. Left ventricular diastolic parameters are  indeterminate. 2. Right ventricular systolic function is normal. The right ventricular size is normal. Tricuspid regurgitation signal is inadequate for assessing PA pressure. 3. Left atrial size was moderately dilated. 4. Right atrial size was moderately dilated. 5. Tricuspid valve regurgitation is mild to moderate. 6. The mitral valve is normal in structure. Mild mitral valve regurgitation. 7. Rhythym is atrial fibrillation   Patient Profile     75 y.o. male with h/o CAD, PAD (s/p b/l amputations), permanent afib, ESRD on HD, morbid obesity, DM who was admitted with chest pain.   Assessment & Plan    Chest pain Recent NSTEMI - presented with chest pain, shaking, and chills while doing HD found to have ecoli bacteremia. EKG with afib and no ischemic changes. HS troponin elevated with  flat trend. HS trop 136>144 - Recent admission for NSTEMI 7/17-7/20 Cardiac cath showed single vessel CAD with 99% stenosis of the second marginal lesion. Discharged on Aspirin, plavix, and Eliquis for 30 days followed by plavix and Eliquis. - Eliquis held and IV heparin started - continue ASA, plavix, BB - Limited Echo ordered.  - Most recent echo showed LVEF 60-65%, no WMA, moderately dilated LA and RA, mild to mod TR, mild MR - Patient denies further chest pain  Bacteremia - found to have E.coli bacteremia, unclear source - IV abx per IM  Afib - Eliquis held as above, continue heparin - rate control with metoprolol  HLD - continue lipitor - LDL 22  H/o hypotension - midodrine '10mg'$  daily on HD days and '5mg'$  the other days  ESRD TTS - HD per nephrology  Anemia of chronic disease - Hgb 7.8, baseline around 8.6 - EPO with HD  For questions or updates, please contact [place] [facility] Please consult [email or web address] for contact info under        Signed, Cadence Ninfa Meeker, PA-C  09/27/2020, 10:34 AM     Pt seen and examined   I agree with findings as noted by C Furth above  Pt appears MUCH  more comfortable today than yesterday  Resting  No shivering  On exam,  Lungs are CTA Cardiac exam   RRR  No signficant murmrus Abd is obese  nontender Ext   s/p ampuutations     Currently CP free    He had troponin elevation but peak a llittle over 300 in setting of ESRD    Note bacteremia  Hgb is a little lower today   Will ned to follow closesly since he is on triple therapy ffor a few ,pre weeks     I have reviewed echo   Images are poor   Cannot evaluate LVEF or wall motion Tomorrow would get limited echo with [facility] to confirm   Keep on current medical regimen.    Dorris Carnes MD

## 2020-09-27 NOTE — ED Notes (Signed)
MD aware pt trialed off O2 at pt request.

## 2020-09-27 NOTE — Progress Notes (Signed)
PROGRESS NOTE    Antonio Phillips  [id number] DOB: [birth year] DOA: 09/26/2020 PCP: Marsh Dolly, MD  Brief Narrative: 75 year old male from [facility] with history of ESRD on hemodialysis Tuesday Thursday Saturday, chronic hypotension on midodrine, severe PAD status post left AKA and right BKA, permanent atrial fibrillation on Eliquis, history of recent non-STEMI treated with PCI and stenting to OM 2 on 7/19 -Presented to the ED with chest pain and severe chills -On further work-up he was noted to have E. coli bacteremia   Assessment & Plan:  Ecoli bacteremia -Some symptoms of dysuria, still makes some urine -Check urinalysis and urine culture -Continue IV ceftriaxone, vancomycin and cefepime discontinued -Follow-up sensitivities   Chest pain -Resolved, ruled out for ACS, mildly elevated high-sensitivity troponin likely from ESRD, recent non-STEMI  Recent NSTEMI.  Discharged on 7/20 - Cardiac cath showed single-vessel coronary artery disease with 99% stenosis of the second marginal lesion.  DES was placed. -Discharged on aspirin for 30 days, Plavix for minimum of 6 months -Eliquis was resumed at discharge for A. fib   ESRD on hemodialysis TTS -Nephrology following, underwent dialysis yesterday  Peripheral vascular disease left AKA and right BKA -Continue Plavix, statin  Chronic atrial fibrillation -Heart rate improved, continue low-dose metoprolol and Eliquis  Chronic hypotension -Midodrine resumed May need to increase dose  Anemia of chronic disease -Hemoglobin 7.8 now, baseline around 8.5, check anemia panel -Will need EPO with HD   DVT prophylaxis: Apixaban Code Status: Full code Family Communication: Discussed patient in detail, no family at bedside Disposition Plan:  Status is: Inpatient  Remains inpatient appropriate because:Inpatient level of care appropriate due to severity of illness  Dispo: The patient is from: SNF              Anticipated d/c  is to: SNF              Patient currently is not medically stable to d/c.   Difficult to place patient No        Consultants:  Cardiology  Procedures:   Antimicrobials:    Subjective: -Feels better today, denies any chest pain or dyspnea, reports chills, was able to make some amount of urine, history of intermittent dysuria for weeks  Objective: Vitals:   09/27/20 0700 09/27/20 0730 09/27/20 0800 09/27/20 0830  BP: (!) '92/56 95/60 93/62 '$ (!) 105/58  Pulse: 82 72 83 76  Resp: '13 13 15 14  '$ Temp:      TempSrc:      SpO2: 100% 100% 100% 100%  Weight:        Intake/Output Summary (Last 24 hours) at 09/27/2020 1003 Last data filed at 09/27/2020 0710 Gross per 24 hour  Intake 1214.01 ml  Output 1000 ml  Net 214.01 ml   Filed Weights   09/26/20 1230  Weight: 98 kg    Examination:  General exam: Obese chronically ill male sitting up in bed, AAOx3, no distress HEENT: Neck obese unable to assess JVD CVS: S1-S2, regular rate rhythm Lungs: Decreased breath sounds to bases Abdomen: Soft, obese, nontender, nondistended, bowel sounds present Extremities: Left AKA, right BKA  skin: No rash on exposed skin Psychiatry: Mood & affect appropriate.     Data Reviewed:   CBC: Recent Labs  Lab 09/22/20 0055 09/23/20 0413 09/26/20 1233 09/26/20 1542 09/27/20 0707  WBC 4.0 5.3 5.7 8.3 5.9  HGB 8.3* 8.6* 8.6* 10.4* 7.8*  HCT 26.1* 27.3* 27.2* 33.7* 24.5*  MCV 96.3 95.5 99.3 99.1 99.6  PLT 96* 106* 107* 139* 96*   Basic Metabolic Panel: Recent Labs  Lab 09/22/20 0055 09/23/20 0413 09/26/20 1233 09/26/20 1542 09/27/20 0707  NA 133* 134* 137 135 135  K 5.3* 4.9 4.6 6.3* 3.8  CL 99 95* 102 98 99  CO2 '22 27 26 '$ 21* 25  GLUCOSE 125* 116* 128* 101* 113*  BUN 54* 34* 41* 41* 26*  CREATININE 6.74* 4.84* 5.66* 6.00* 4.11*  CALCIUM 8.4* 8.8* 8.6* 9.3 8.6*  PHOS  --   --   --  4.9* 3.7   GFR: Estimated Creatinine Clearance: 18.5 mL/min (A) (by C-G formula based on SCr  of 4.11 mg/dL (H)). Liver Function Tests: Recent Labs  Lab 09/26/20 1542 09/27/20 0707  ALBUMIN 3.6 2.7*   No results for input(s): LIPASE, AMYLASE in the last 168 hours. No results for input(s): AMMONIA in the last 168 hours. Coagulation Profile: Recent Labs  Lab 09/26/20 1233  INR 1.7*   Cardiac Enzymes: No results for input(s): CKTOTAL, CKMB, CKMBINDEX, TROPONINI in the last 168 hours. BNP (last 3 results) No results for input(s): PROBNP in the last 8760 hours. HbA1C: No results for input(s): HGBA1C in the last 72 hours. CBG: Recent Labs  Lab 09/20/20 1727 09/26/20 1510 09/27/20 0853  GLUCAP 145* 102* 122*   Lipid Profile: No results for input(s): CHOL, HDL, LDLCALC, TRIG, CHOLHDL, LDLDIRECT in the last 72 hours. Thyroid Function Tests: No results for input(s): TSH, T4TOTAL, FREET4, T3FREE, THYROIDAB in the last 72 hours. Anemia Panel: Recent Labs    09/27/20 0707  FOLATE 10.4  FERRITIN 1,692*  TIBC 162*  IRON 38*  RETICCTPCT 2.0   Urine analysis:    Component Value Date/Time   COLORURINE YELLOW (A) 09/09/2019 1654   APPEARANCEUR HAZY (A) 09/09/2019 1654   APPEARANCEUR Hazy 11/14/2012 2044   LABSPEC 1.007 09/09/2019 1654   LABSPEC 1.016 11/14/2012 2044   PHURINE 9.0 (H) 09/09/2019 1654   GLUCOSEU >=500 (A) 09/09/2019 1654   GLUCOSEU 150 mg/dL 11/14/2012 2044   HGBUR NEGATIVE 09/09/2019 1654   [place] 09/09/2019 1654   BILIRUBINUR Negative 11/14/2012 2044   KETONESUR NEGATIVE 09/09/2019 1654   PROTEINUR 100 (A) 09/09/2019 1654   NITRITE NEGATIVE 09/09/2019 1654   LEUKOCYTESUR NEGATIVE 09/09/2019 1654   LEUKOCYTESUR Trace 11/14/2012 2044   Sepsis Labs: '@LABRCNTIP'$ (procalcitonin:4,lacticidven:4)  ) Recent Results (from the past 240 hour(s))  SARS CORONAVIRUS 2 (TAT 6-24 HRS) Nasopharyngeal Nasopharyngeal Swab     Status: None   Collection Time: 09/20/20  9:27 PM   Specimen: Nasopharyngeal Swab  Result Value Ref Range Status   SARS  Coronavirus 2 NEGATIVE NEGATIVE Final    Comment: (NOTE) SARS-CoV-2 target nucleic acids are NOT DETECTED.  The SARS-CoV-2 RNA is generally detectable in upper and lower respiratory specimens during the acute phase of infection. Negative results do not preclude SARS-CoV-2 infection, do not rule out co-infections with other pathogens, and should not be used as the sole basis for treatment or other patient management decisions. Negative results must be combined with clinical observations, patient history, and epidemiological information. The expected result is Negative.  Fact Sheet for Patients: SugarRoll.be  Fact Sheet for Healthcare Providers: https://www.woods-mathews.com/  This test is not yet approved or cleared by the [place] FDA and  has been authorized for detection and/or diagnosis of SARS-CoV-2 by FDA under an Emergency Use Authorization (EUA). This EUA will remain  in effect (meaning this test can be used) for the duration of the COVID-19 declaration under Se ction  564(b)(1) of the Act, 21 U.S.C. section 360bbb-3(b)(1), unless the authorization is terminated or revoked sooner.  Performed at [facility], [place] [address], [place], [place] [address]   MRSA Next Gen by PCR, Nasal     Status: Abnormal   Collection Time: 09/21/20  1:20 PM   Specimen: Nasal Mucosa; Nasal Swab  Result Value Ref Range Status   MRSA by PCR Next Gen DETECTED (A) NOT DETECTED Final    Comment: CRITICAL RESULT CALLED TO, READ BACK BY AND VERIFIED WITH: Arnetha Courser [id number] 09/21/20 GM (NOTE) The GeneXpert MRSA Assay (FDA approved for NASAL specimens only), is one component of a comprehensive MRSA colonization surveillance program. It is not intended to diagnose MRSA infection nor to guide or monitor treatment for MRSA infections. Test performance is not FDA approved in patients less than 8 years old. Performed at [facility], [place], [place] [address]   Resp Panel by RT-PCR (Flu A&B, Covid) Nasopharyngeal Swab     Status: None   Collection Time: 09/26/20  4:04 PM   Specimen: Nasopharyngeal Swab; Nasopharyngeal(NP) swabs in vial transport medium  Result Value Ref Range Status   SARS Coronavirus 2 by RT PCR NEGATIVE NEGATIVE Final    Comment: (NOTE) SARS-CoV-2 target nucleic acids are NOT DETECTED.  The SARS-CoV-2 RNA is generally detectable in upper respiratory specimens during the acute phase of infection. The lowest concentration of SARS-CoV-2 viral copies this assay can detect is 138 copies/mL. A negative result does not preclude SARS-Cov-2 infection and should not be used as the sole basis for treatment or other patient management decisions. A negative result may occur with  improper specimen collection/handling, submission of specimen other than nasopharyngeal swab, presence of viral mutation(s) within the areas targeted by this assay, and inadequate number of viral copies(<138 copies/mL). A negative result must be combined with clinical observations, patient history, and epidemiological information. The expected result is Negative.  Fact Sheet for Patients:  EntrepreneurPulse.com.au  Fact Sheet for Healthcare Providers:  IncredibleEmployment.be  This test is no t yet approved or cleared by the [place] FDA and  has been authorized for detection and/or diagnosis of SARS-CoV-2 by FDA under an Emergency Use Authorization (EUA). This EUA will remain  in effect (meaning this test can be used) for the duration of the COVID-19 declaration under Section 564(b)(1) of the Act, 21 U.S.C.section 360bbb-3(b)(1), unless the authorization is terminated  or revoked sooner.       Influenza A by PCR NEGATIVE NEGATIVE Final   Influenza B by PCR NEGATIVE NEGATIVE Final    Comment: (NOTE) The Xpert Xpress SARS-CoV-2/FLU/RSV plus assay is intended as an  aid in the diagnosis of influenza from Nasopharyngeal swab specimens and should not be used as a sole basis for treatment. Nasal washings and aspirates are unacceptable for Xpert Xpress SARS-CoV-2/FLU/RSV testing.  Fact Sheet for Patients: EntrepreneurPulse.com.au  Fact Sheet for Healthcare Providers: IncredibleEmployment.be  This test is not yet approved or cleared by the [place] FDA and has been authorized for detection and/or diagnosis of SARS-CoV-2 by FDA under an Emergency Use Authorization (EUA). This EUA will remain in effect (meaning this test can be used) for the duration of the COVID-19 declaration under Section 564(b)(1) of the Act, 21 U.S.C. section 360bbb-3(b)(1), unless the authorization is terminated or revoked.  Performed at [facility], [address] [place], [place] [address]   Blood culture (routine x 2)     Status: None (Preliminary result)  Collection Time: 09/26/20  4:06 PM   Specimen: BLOOD  Result Value Ref Range Status   Specimen Description BLOOD BLOOD RIGHT ARM  Final   Special Requests   Final    BOTTLES DRAWN AEROBIC AND ANAEROBIC Blood Culture results may not be optimal due to an inadequate volume of blood received in culture bottles   Culture  Setup Time   Final    Organism ID to follow GRAM NEGATIVE RODS AEROBIC BOTTLE ONLY CRITICAL RESULT CALLED TO, READ BACK BY AND VERIFIED WITH: JASON ROBERTS AT [place] 09/27/2020 DLB Performed at [facility], [address] [place], [place] [address]    Culture GRAM NEGATIVE RODS  Final   Report Status PENDING  Incomplete  Blood Culture ID Panel (Reflexed)     Status: Abnormal   Collection Time: 09/26/20  4:06 PM  Result Value Ref Range Status   Enterococcus faecalis NOT DETECTED NOT DETECTED Final   Enterococcus Faecium NOT DETECTED NOT DETECTED Final   Listeria monocytogenes NOT DETECTED NOT DETECTED Final   Staphylococcus species NOT  DETECTED NOT DETECTED Final   Staphylococcus aureus (BCID) NOT DETECTED NOT DETECTED Final   Staphylococcus epidermidis NOT DETECTED NOT DETECTED Final   Staphylococcus lugdunensis NOT DETECTED NOT DETECTED Final   Streptococcus species NOT DETECTED NOT DETECTED Final   Streptococcus agalactiae NOT DETECTED NOT DETECTED Final   Streptococcus pneumoniae NOT DETECTED NOT DETECTED Final   Streptococcus pyogenes NOT DETECTED NOT DETECTED Final   A.calcoaceticus-baumannii NOT DETECTED NOT DETECTED Final   Bacteroides fragilis NOT DETECTED NOT DETECTED Final   Enterobacterales DETECTED (A) NOT DETECTED Final    Comment: Enterobacterales represent a large order of gram negative bacteria, not a single organism. CRITICAL RESULT CALLED TO, READ BACK BY AND VERIFIED WITH: JASON ROBERTS AT 0410 09/27/2020 DLB    Enterobacter cloacae complex NOT DETECTED NOT DETECTED Final   Escherichia coli DETECTED (A) NOT DETECTED Final    Comment: CRITICAL RESULT CALLED TO, READ BACK BY AND VERIFIED WITH: JASON ROBERTS AT 0410 09/27/2020 DLB    Klebsiella aerogenes NOT DETECTED NOT DETECTED Final   Klebsiella oxytoca NOT DETECTED NOT DETECTED Final   Klebsiella pneumoniae NOT DETECTED NOT DETECTED Final   Proteus species NOT DETECTED NOT DETECTED Final   Salmonella species NOT DETECTED NOT DETECTED Final   Serratia marcescens NOT DETECTED NOT DETECTED Final   Haemophilus influenzae NOT DETECTED NOT DETECTED Final   Neisseria meningitidis NOT DETECTED NOT DETECTED Final   Pseudomonas aeruginosa NOT DETECTED NOT DETECTED Final   Stenotrophomonas maltophilia NOT DETECTED NOT DETECTED Final   Candida albicans NOT DETECTED NOT DETECTED Final   Candida auris NOT DETECTED NOT DETECTED Final   Candida glabrata NOT DETECTED NOT DETECTED Final   Candida krusei NOT DETECTED NOT DETECTED Final   Candida parapsilosis NOT DETECTED NOT DETECTED Final   Candida tropicalis NOT DETECTED NOT DETECTED Final   Cryptococcus  neoformans/gattii NOT DETECTED NOT DETECTED Final   CTX-M ESBL NOT DETECTED NOT DETECTED Final   Carbapenem resistance IMP NOT DETECTED NOT DETECTED Final   Carbapenem resistance KPC NOT DETECTED NOT DETECTED Final   Carbapenem resistance NDM NOT DETECTED NOT DETECTED Final   Carbapenem resist OXA 48 LIKE NOT DETECTED NOT DETECTED Final   Carbapenem resistance VIM NOT DETECTED NOT DETECTED Final    Comment: Performed at [facility], [address] [place], [place] [address]  Blood culture (routine x 2)     Status: None (Preliminary result)   Collection Time:  09/26/20  6:00 PM   Specimen: BLOOD  Result Value Ref Range Status   Specimen Description BLOOD RIGHT ANTECUBITAL  Final   Special Requests   Final    BOTTLES DRAWN AEROBIC AND ANAEROBIC Blood Culture results may not be optimal due to an inadequate volume of blood received in culture bottles   Culture   Final    NO GROWTH < 12 HOURS Performed at [facility], [address] [place], [place] [address]    Report Status PENDING  Incomplete         Radiology Studies: DG Chest 2 View  Result Date: 09/26/2020 CLINICAL DATA:  chest pain EXAM: CHEST - 2 VIEW COMPARISON:  September 20, 2020 FINDINGS: The cardiomediastinal silhouette is unchanged and enlarged in contour. No pleural effusion. No pneumothorax. Persistent mild interstitial prominence peribronchial cuffing. No acute pleuroparenchymal abnormality. Visualized abdomen is unremarkable. Multilevel degenerative changes of the thoracic spine. IMPRESSION: Cardiomegaly with persistent mild interstitial prominence likely reflecting a degree of chronic underlying pulmonary edema. Electronically Signed   By: Valentino Saxon MD   On: 09/26/2020 13:12   [place] LT LOWER EXTREM LTD SOFT TISSUE NON VASCULAR  Result Date: 09/26/2020 CLINICAL DATA:  Wound on left buttock, concern for abscess EXAM: ULTRASOUND LEFT LOWER EXTREMITY LIMITED TECHNIQUE: Ultrasound examination of the  lower extremity soft tissues was performed in the area of clinical concern. COMPARISON:  None. FINDINGS: Targeted grayscale and color Doppler ultrasound evaluation was performed at the site of reported wound in the region of the left buttock. Mild superficial soft tissue edematous changes are noted. No discrete abscess, collection or subcutaneous mass is identified. IMPRESSION: Targeted ultrasound evaluation of the indicated area of concern reveals mild induration and subcutaneous edema without subjacent collection, abscess or mass. Electronically Signed   By: Lovena Le M.D.   On: 09/26/2020 20:08        Scheduled Meds:  aspirin  81 mg Oral Daily   atorvastatin  40 mg Oral QHS   Chlorhexidine Gluconate Cloth  6 each Topical Q0600   clopidogrel  75 mg Oral Q breakfast   finasteride  5 mg Oral Daily   loratadine  10 mg Oral Once per day on Sun Mon Wed Fri   metoprolol tartrate  12.5 mg Oral QID   [START ON 09/29/2020] midodrine  10 mg Oral Q T,Th,Sa-HD   midodrine  5 mg Oral Daily   pregabalin  75 mg Oral QHS   rOPINIRole  1 mg Oral QHS   sevelamer carbonate  1,600 mg Oral 3 times per day on Sun Mon Wed Fri   sevelamer carbonate  800 mg Oral 2 times per day on Tue Thu Sat   vitamin B-12  1,000 mcg Oral Daily   Continuous Infusions:  sodium chloride 100 mL (09/26/20 2229)   sodium chloride     cefTRIAXone (ROCEPHIN)  IV Stopped (09/27/20 0710)   heparin 1,000 Units/hr (09/26/20 1613)     LOS: 1 day    Time spent: 69mn  PDomenic Polite MD [facility]   09/27/2020, 10:03 AM

## 2020-09-27 NOTE — ED Notes (Signed)
Went in to assess patient, assisted with bathroom. bp still low

## 2020-09-27 NOTE — Progress Notes (Signed)
[facility]  PROGRESS NOTE   Subjective:   Feels much better this morning.  Had stable dialysis last night. He ate better.  Patient said the shaking chills have gone down significantly.  Objective:  Vital signs in last 24 hours:  Temp:  [97.5 F (36.4 C)-99.6 F (37.6 C)] 98.1 F (36.7 C) (07/23 2055) Pulse Rate:  [69-146] 76 (07/24 0830) Resp:  [12-21] 14 (07/24 0830) BP: (78-145)/(38-90) 105/58 (07/24 0830) SpO2:  [94 %-100 %] 100 % (07/24 0830) Weight:  [98 kg] 98 kg (07/23 1230)  Weight change:  Filed Weights   09/26/20 1230  Weight: 98 kg    Intake/Output: I/O last 3 completed shifts: In: 1116 [IV Piggyback:1116] Out: 1000 [Other:1000]   Intake/Output this shift:  Total I/O In: 98 [IV Piggyback:98] Out: -   Physical Exam: General:  No acute distress  Head:  Normocephalic, atraumatic. Moist oral mucosal membranes  Eyes:  Anicteric  Neck:  Supple  Lungs:   Clear to auscultation, normal effort  Heart:  S1S2 no rubs  Abdomen:   Soft, nontender, bowel sounds present  Extremities: Bilateral amputee.  Neurologic:  Awake, alert, following commands  Skin:  No lesions  Access:     Basic Metabolic Panel: Recent Labs  Lab 09/22/20 0055 09/23/20 0413 09/26/20 1233 09/26/20 1542 09/27/20 0707  NA 133* 134* 137 135 135  K 5.3* 4.9 4.6 6.3* 3.8  CL 99 95* 102 98 99  CO2 '22 27 26 '$ 21* 25  GLUCOSE 125* 116* 128* 101* 113*  BUN 54* 34* 41* 41* 26*  CREATININE 6.74* 4.84* 5.66* 6.00* 4.11*  CALCIUM 8.4* 8.8* 8.6* 9.3 8.6*  PHOS  --   --   --  4.9* 3.7    CBC: Recent Labs  Lab 09/22/20 0055 09/23/20 0413 09/26/20 1233 09/26/20 1542 09/27/20 0707  WBC 4.0 5.3 5.7 8.3 5.9  HGB 8.3* 8.6* 8.6* 10.4* 7.8*  HCT 26.1* 27.3* 27.2* 33.7* 24.5*  MCV 96.3 95.5 99.3 99.1 99.6  PLT 96* 106* 107* 139* 96*     Urinalysis: No results for input(s): COLORURINE, LABSPEC, PHURINE, GLUCOSEU, HGBUR, BILIRUBINUR, KETONESUR, PROTEINUR, UROBILINOGEN, NITRITE,  LEUKOCYTESUR in the last 72 hours.  Invalid input(s): APPERANCEUR    Imaging: DG Chest 2 View  Result Date: 09/26/2020 CLINICAL DATA:  chest pain EXAM: CHEST - 2 VIEW COMPARISON:  September 20, 2020 FINDINGS: The cardiomediastinal silhouette is unchanged and enlarged in contour. No pleural effusion. No pneumothorax. Persistent mild interstitial prominence peribronchial cuffing. No acute pleuroparenchymal abnormality. Visualized abdomen is unremarkable. Multilevel degenerative changes of the thoracic spine. IMPRESSION: Cardiomegaly with persistent mild interstitial prominence likely reflecting a degree of chronic underlying pulmonary edema. Electronically Signed   By: Valentino Saxon MD   On: 09/26/2020 13:12   [place] LT LOWER EXTREM LTD SOFT TISSUE NON VASCULAR  Result Date: 09/26/2020 CLINICAL DATA:  Wound on left buttock, concern for abscess EXAM: ULTRASOUND LEFT LOWER EXTREMITY LIMITED TECHNIQUE: Ultrasound examination of the lower extremity soft tissues was performed in the area of clinical concern. COMPARISON:  None. FINDINGS: Targeted grayscale and color Doppler ultrasound evaluation was performed at the site of reported wound in the region of the left buttock. Mild superficial soft tissue edematous changes are noted. No discrete abscess, collection or subcutaneous mass is identified. IMPRESSION: Targeted ultrasound evaluation of the indicated area of concern reveals mild induration and subcutaneous edema without subjacent collection, abscess or mass. Electronically Signed   By: Lovena Le M.D.   On: 09/26/2020 20:08  Medications:    sodium chloride 100 mL (09/26/20 2229)   sodium chloride     cefTRIAXone (ROCEPHIN)  IV Stopped (09/27/20 0710)   heparin 1,000 Units/hr (09/26/20 1613)    aspirin  81 mg Oral Daily   atorvastatin  40 mg Oral QHS   Chlorhexidine Gluconate Cloth  6 each Topical Q0600   clopidogrel  75 mg Oral Q breakfast   finasteride  5 mg Oral Daily   loratadine  10 mg  Oral Once per day on Sun Mon Wed Fri   metoprolol tartrate  12.5 mg Oral BID   [START ON 09/29/2020] midodrine  10 mg Oral Q T,Th,Sa-HD   midodrine  5 mg Oral Daily   pregabalin  75 mg Oral QHS   rOPINIRole  1 mg Oral QHS   sevelamer carbonate  1,600 mg Oral 3 times per day on Sun Mon Wed Fri   sevelamer carbonate  800 mg Oral 2 times per day on Tue Thu Sat   vitamin B-12  1,000 mcg Oral Daily    Assessment/ Plan:     Active Problems:   Chest pain  75 year old male with a history of hypertension, coronary artery disease, congestive heart failure, diabetes, peripheral vascular disease, bilateral amputation and ESRD on hemodialysis Tuesday Thursday Saturday schedule.  He was admitted with chest pain last night.   #1 ESRD: Had stable dialysis yesterday via left AV fistula.  #2: Hyperkalemia: Potassium has improved after dialysis.  #3: Anemia: Worsening anemia, will monitor closely and continue anemia protocols.  #4: Secondary hyperparathyroidism: We will monitor PTH, phosphorus and calcium levels.  Continue the sevelamer.  #5: Restless leg syndrome: We will continue the ropinirole.  Other medications reviewed and appropriate. Prior notes appreciated.    LOS: [place], MD [facility] 7/24/202210:15 AM

## 2020-09-27 NOTE — ED Notes (Signed)
This [facility] pharmacy for missing medications.

## 2020-09-27 NOTE — ED Notes (Signed)
D/C 2L O2 at pt request.  O2 sat on 2L was 100.

## 2020-09-27 NOTE — ED Notes (Signed)
Contacting admitting md about metoprolol.

## 2020-09-28 ENCOUNTER — Inpatient Hospital Stay (HOSPITAL_COMMUNITY)
Admit: 2020-09-28 | Discharge: 2020-09-28 | Disposition: A | Discharge status: Home / Self Care | Payer: No Typology Code available for payment source | Attending: Internal Medicine | Admitting: Internal Medicine

## 2020-09-28 DIAGNOSIS — I2510 Atherosclerotic heart disease of native coronary artery without angina pectoris: Secondary | ICD-10-CM | POA: Diagnosis not present

## 2020-09-28 DIAGNOSIS — I4821 Permanent atrial fibrillation: Secondary | ICD-10-CM | POA: Diagnosis not present

## 2020-09-28 DIAGNOSIS — I248 Other forms of acute ischemic heart disease: Secondary | ICD-10-CM | POA: Diagnosis not present

## 2020-09-28 DIAGNOSIS — N186 End stage renal disease: Secondary | ICD-10-CM | POA: Diagnosis not present

## 2020-09-28 DIAGNOSIS — R079 Chest pain, unspecified: Secondary | ICD-10-CM | POA: Diagnosis not present

## 2020-09-28 DIAGNOSIS — T148XXA Other injury of unspecified body region, initial encounter: Secondary | ICD-10-CM | POA: Diagnosis not present

## 2020-09-28 LAB — CBC
HCT: 26.3 % — ABNORMAL LOW (ref 39.0–52.0)
Hemoglobin: 8.3 g/dL — ABNORMAL LOW (ref 13.0–17.0)
MCH: 31 pg (ref 26.0–34.0)
MCHC: 31.6 g/dL (ref 30.0–36.0)
MCV: 98.1 fL (ref 80.0–100.0)
Platelets: 103 10*3/uL — ABNORMAL LOW (ref 150–400)
RBC: 2.68 MIL/uL — ABNORMAL LOW (ref 4.22–5.81)
RDW: 17.6 % — ABNORMAL HIGH (ref 11.5–15.5)
WBC: 5.9 10*3/uL (ref 4.0–10.5)
nRBC: 0 % (ref 0.0–0.2)

## 2020-09-28 LAB — BASIC METABOLIC PANEL
Anion gap: 13 (ref 5–15)
BUN: 35 mg/dL — ABNORMAL HIGH (ref 8–23)
CO2: 23 mmol/L (ref 22–32)
Calcium: 8.7 mg/dL — ABNORMAL LOW (ref 8.9–10.3)
Chloride: 98 mmol/L (ref 98–111)
Creatinine, Ser: 5.31 mg/dL — ABNORMAL HIGH (ref 0.61–1.24)
GFR, Estimated: 11 mL/min — ABNORMAL LOW (ref >60)
Glucose, Bld: 91 mg/dL (ref 70–99)
Potassium: 4.4 mmol/L (ref 3.5–5.1)
Sodium: 134 mmol/L — ABNORMAL LOW (ref 135–145)

## 2020-09-28 LAB — URINE CULTURE: Culture: NO GROWTH

## 2020-09-28 LAB — ECHOCARDIOGRAM LIMITED
S' Lateral: 3.53 cm
Weight: 3456.81 oz

## 2020-09-28 LAB — PROCALCITONIN: Procalcitonin: 8.61 ng/mL

## 2020-09-28 MED ORDER — PANTOPRAZOLE SODIUM 40 MG PO TBEC
40.0000 mg | DELAYED_RELEASE_TABLET | Freq: Every day | ORAL | Status: DC
  Administered 2020-09-29 – 2020-09-30 (×2): 40 mg via ORAL
  Filled 2020-09-28 (×2): qty 1

## 2020-09-28 NOTE — ED Notes (Signed)
Informed RN bed assigned 

## 2020-09-28 NOTE — ED Notes (Signed)
Pt repositioned in bed, bed sheet and pad changed.

## 2020-09-28 NOTE — Consult Note (Addendum)
[facility] Nurse Consult Note: Reason for Consult: Consult requested for left buttock.  Pt states he previously developed "a boil" to this location which ruptured.   Left hip/buttock with patchy areas of red moist scattered partial thickness wounds; affected area is approx 5X5cm, wounds are red, round shallow vesicle-in appearance, beginning to dry and peel in scattered areas.  Appearance is NOT consistent with pressure injuries, but these were present on admission. Dressing procedure/placement/frequency: Topical treatment orders provided for bedside nurses to perform as follows to protect and promote healing: Foam dressing to left buttock; change Q 3 days or PRN soiling. Please re-consult if further assistance is needed.  Thank-you,  Julien Girt MSN, [place], [place], [place], [place]

## 2020-09-28 NOTE — Progress Notes (Signed)
[facility]  ROUNDING NOTE   Subjective:   Antonio Phillips is a 75 y.o. male with a past medical history of hypertension, CAD, rt BKA, and ESRD on dialysis. He presented to the ED from his SNF for chest pain, chills and rigors  Patient had a recent admission last week for chest pain and underwent cardiac cath with stent placement.   Patient currently receives dialysis at [facility], supervised by Dr. Candiss Norse. We have been consulted to manage dialysis during this admission  Patient seen resting on stretcher Denies chest pain at this time Received dialysis Saturday   Objective:  Vital signs in last 24 hours:  Pulse Rate:  [60-103] 89 (07/25 1030) Resp:  [12-19] 14 (07/25 1030) BP: (87-124)/(45-76) 116/61 (07/25 1030) SpO2:  [93 %-100 %] 95 % (07/25 1030)  Weight change:  Filed Weights   09/26/20 1230  Weight: 98 kg    Intake/Output: I/O last 3 completed shifts: In: 1315.7 [I.V.:201.7; IV [id number] Out: 1200 [Urine:200; Other:1000]   Intake/Output this shift:  Total I/O In: 100 [IV Piggyback:100] Out: -   Physical Exam: General: NAD, resting in bed  Head: Normocephalic, atraumatic. Moist oral mucosal membranes  Eyes: Anicteric  Lungs:  Clear to auscultation, normal effort  Heart: Regular rate and rhythm  Abdomen:  Soft, nontender  Extremities:  trace peripheral edema.Rt BKA, Lt AKA  Neurologic: Nonfocal, moving all four extremities  Skin: No lesions  Access: Lt AVF    Basic Metabolic Panel: Recent Labs  Lab 09/23/20 0413 09/26/20 1233 09/26/20 1542 09/27/20 0707 09/28/20 0742  NA 134* 137 135 135 134*  K 4.9 4.6 6.3* 3.8 4.4  CL 95* 102 98 99 98  CO2 27 26 21* 25 23  GLUCOSE 116* 128* 101* 113* 91  BUN 34* 41* 41* 26* 35*  CREATININE 4.84* 5.66* 6.00* 4.11* 5.31*  CALCIUM 8.8* 8.6* 9.3 8.6* 8.7*  PHOS  --   --  4.9* 3.7  --      Liver Function Tests: Recent Labs  Lab 09/26/20 1542 09/27/20 0707  ALBUMIN 3.6 2.7*    No results for input(s): LIPASE, AMYLASE in the last 168 hours. No results for input(s): AMMONIA in the last 168 hours.  CBC: Recent Labs  Lab 09/23/20 0413 09/26/20 1233 09/26/20 1542 09/27/20 0707 09/28/20 0742  WBC 5.3 5.7 8.3 5.9 5.9  HGB 8.6* 8.6* 10.4* 7.8* 8.3*  HCT 27.3* 27.2* 33.7* 24.5* 26.3*  MCV 95.5 99.3 99.1 99.6 98.1  PLT 106* 107* 139* 96* 103*     Cardiac Enzymes: No results for input(s): CKTOTAL, CKMB, CKMBINDEX, TROPONINI in the last 168 hours.  BNP: Invalid input(s): POCBNP  CBG: Recent Labs  Lab 09/26/20 1510 09/27/20 0853  GLUCAP 102* 122*     Microbiology: Results for orders placed or performed during the hospital encounter of 09/26/20  Resp Panel by RT-PCR (Flu A&B, Covid) Nasopharyngeal Swab     Status: None   Collection Time: 09/26/20  4:04 PM   Specimen: Nasopharyngeal Swab; Nasopharyngeal(NP) swabs in vial transport medium  Result Value Ref Range Status   SARS Coronavirus 2 by RT PCR NEGATIVE NEGATIVE Final    Comment: (NOTE) SARS-CoV-2 target nucleic acids are NOT DETECTED.  The SARS-CoV-2 RNA is generally detectable in upper respiratory specimens during the acute phase of infection. The lowest concentration of SARS-CoV-2 viral copies this assay can detect is 138 copies/mL. A negative result does not preclude SARS-Cov-2 infection and should not be used as the sole  basis for treatment or other patient management decisions. A negative result may occur with  improper specimen collection/handling, submission of specimen other than nasopharyngeal swab, presence of viral mutation(s) within the areas targeted by this assay, and inadequate number of viral copies(<138 copies/mL). A negative result must be combined with clinical observations, patient history, and epidemiological information. The expected result is Negative.  Fact Sheet for Patients:  EntrepreneurPulse.com.au  Fact Sheet for Healthcare Providers:   IncredibleEmployment.be  This test is no t yet approved or cleared by the [place] FDA and  has been authorized for detection and/or diagnosis of SARS-CoV-2 by FDA under an Emergency Use Authorization (EUA). This EUA will remain  in effect (meaning this test can be used) for the duration of the COVID-19 declaration under Section 564(b)(1) of the Act, 21 U.S.C.section 360bbb-3(b)(1), unless the authorization is terminated  or revoked sooner.       Influenza A by PCR NEGATIVE NEGATIVE Final   Influenza B by PCR NEGATIVE NEGATIVE Final    Comment: (NOTE) The Xpert Xpress SARS-CoV-2/FLU/RSV plus assay is intended as an aid in the diagnosis of influenza from Nasopharyngeal swab specimens and should not be used as a sole basis for treatment. Nasal washings and aspirates are unacceptable for Xpert Xpress SARS-CoV-2/FLU/RSV testing.  Fact Sheet for Patients: EntrepreneurPulse.com.au  Fact Sheet for Healthcare Providers: IncredibleEmployment.be  This test is not yet approved or cleared by the [place] FDA and has been authorized for detection and/or diagnosis of SARS-CoV-2 by FDA under an Emergency Use Authorization (EUA). This EUA will remain in effect (meaning this test can be used) for the duration of the COVID-19 declaration under Section 564(b)(1) of the Act, 21 U.S.C. section 360bbb-3(b)(1), unless the authorization is terminated or revoked.  Performed at [facility] [place], [address] [place], [place] [address]   Blood culture (routine x 2)     Status: Abnormal (Preliminary result)   Collection Time: 09/26/20  4:06 PM   Specimen: BLOOD  Result Value Ref Range Status   Specimen Description   Final    BLOOD BLOOD RIGHT ARM Performed at [facility], [address] [place], [place] [address]    Special Requests   Final    BOTTLES DRAWN AEROBIC AND ANAEROBIC Blood Culture results may not be  optimal due to an inadequate volume of blood received in culture bottles Performed at [facility], [address] [place], [place] [address]    Culture  Setup Time   Final    Organism ID to follow [place] CRITICAL RESULT CALLED TO, READ BACK BY AND VERIFIED WITH: Enrique Sack AT [place] 09/27/2020 DLB Performed at [facility], [address], [place], [place] [address]    Culture (A)  Final    ESCHERICHIA COLI SUSCEPTIBILITIES TO FOLLOW Performed at [facility], [place] [address] [place], [place] [address]    Report Status PENDING  Incomplete  Blood Culture ID Panel (Reflexed)     Status: Abnormal   Collection Time: 09/26/20  4:06 PM  Result Value Ref Range Status   Enterococcus faecalis NOT DETECTED NOT DETECTED Final   Enterococcus Faecium NOT DETECTED NOT DETECTED Final   Listeria monocytogenes NOT DETECTED NOT DETECTED Final   Staphylococcus species NOT DETECTED NOT DETECTED Final   Staphylococcus aureus (BCID) NOT DETECTED NOT DETECTED Final   Staphylococcus epidermidis NOT DETECTED NOT DETECTED Final   Staphylococcus lugdunensis NOT DETECTED NOT DETECTED Final   Streptococcus species  NOT DETECTED NOT DETECTED Final   Streptococcus agalactiae NOT DETECTED NOT DETECTED Final   Streptococcus pneumoniae NOT DETECTED NOT DETECTED Final   Streptococcus pyogenes NOT DETECTED NOT DETECTED Final   A.calcoaceticus-baumannii NOT DETECTED NOT DETECTED Final   Bacteroides fragilis NOT DETECTED NOT DETECTED Final   Enterobacterales DETECTED (A) NOT DETECTED Final    Comment: Enterobacterales represent a large order of gram negative bacteria, not a single organism. CRITICAL RESULT CALLED TO, READ BACK BY AND VERIFIED WITH: JASON ROBERTS AT 0410 09/27/2020 DLB    Enterobacter cloacae complex NOT DETECTED NOT DETECTED Final   Escherichia coli DETECTED (A) NOT DETECTED Final    Comment: CRITICAL RESULT CALLED TO, READ BACK BY AND  VERIFIED WITH: JASON ROBERTS AT 0410 09/27/2020 DLB    Klebsiella aerogenes NOT DETECTED NOT DETECTED Final   Klebsiella oxytoca NOT DETECTED NOT DETECTED Final   Klebsiella pneumoniae NOT DETECTED NOT DETECTED Final   Proteus species NOT DETECTED NOT DETECTED Final   Salmonella species NOT DETECTED NOT DETECTED Final   Serratia marcescens NOT DETECTED NOT DETECTED Final   Haemophilus influenzae NOT DETECTED NOT DETECTED Final   Neisseria meningitidis NOT DETECTED NOT DETECTED Final   Pseudomonas aeruginosa NOT DETECTED NOT DETECTED Final   Stenotrophomonas maltophilia NOT DETECTED NOT DETECTED Final   Candida albicans NOT DETECTED NOT DETECTED Final   Candida auris NOT DETECTED NOT DETECTED Final   Candida glabrata NOT DETECTED NOT DETECTED Final   Candida krusei NOT DETECTED NOT DETECTED Final   Candida parapsilosis NOT DETECTED NOT DETECTED Final   Candida tropicalis NOT DETECTED NOT DETECTED Final   Cryptococcus neoformans/gattii NOT DETECTED NOT DETECTED Final   CTX-M ESBL NOT DETECTED NOT DETECTED Final   Carbapenem resistance IMP NOT DETECTED NOT DETECTED Final   Carbapenem resistance KPC NOT DETECTED NOT DETECTED Final   Carbapenem resistance NDM NOT DETECTED NOT DETECTED Final   Carbapenem resist OXA 48 LIKE NOT DETECTED NOT DETECTED Final   Carbapenem resistance VIM NOT DETECTED NOT DETECTED Final    Comment: Performed at [facility], [address] [place], [place] [address]  Blood culture (routine x 2)     Status: None (Preliminary result)   Collection Time: 09/26/20  6:00 PM   Specimen: BLOOD  Result Value Ref Range Status   Specimen Description BLOOD RIGHT ANTECUBITAL  Final   Special Requests   Final    BOTTLES DRAWN AEROBIC AND ANAEROBIC Blood Culture results may not be optimal due to an inadequate volume of blood received in culture bottles   Culture   Final    NO GROWTH 2 DAYS Performed at [facility], [address] [place], [place]  [address]    Report Status PENDING  Incomplete    Coagulation Studies: Recent Labs    09/26/20 1233  LABPROT 20.1*  INR 1.7*    Urinalysis: Recent Labs    09/27/20 1455  COLORURINE YELLOW*  LABSPEC 1.009  PHURINE 9.0*  GLUCOSEU 150*  HGBUR SMALL*  BILIRUBINUR NEGATIVE  KETONESUR NEGATIVE  PROTEINUR 100*  NITRITE NEGATIVE  LEUKOCYTESUR LARGE*       Imaging: DG Chest 2 View  Result Date: 09/26/2020 CLINICAL DATA:  chest pain EXAM: CHEST - 2 VIEW COMPARISON:  September 20, 2020 FINDINGS: The cardiomediastinal silhouette is unchanged and enlarged in contour. No pleural effusion. No pneumothorax. Persistent mild interstitial prominence peribronchial cuffing. No acute pleuroparenchymal abnormality. Visualized abdomen is unremarkable. Multilevel degenerative changes of the thoracic spine. IMPRESSION: Cardiomegaly with persistent mild  interstitial prominence likely reflecting a degree of chronic underlying pulmonary edema. Electronically Signed   By: Valentino Saxon MD   On: 09/26/2020 13:12   [place] LT LOWER EXTREM LTD SOFT TISSUE NON VASCULAR  Result Date: 09/26/2020 CLINICAL DATA:  Wound on left buttock, concern for abscess EXAM: ULTRASOUND LEFT LOWER EXTREMITY LIMITED TECHNIQUE: Ultrasound examination of the lower extremity soft tissues was performed in the area of clinical concern. COMPARISON:  None. FINDINGS: Targeted grayscale and color Doppler ultrasound evaluation was performed at the site of reported wound in the region of the left buttock. Mild superficial soft tissue edematous changes are noted. No discrete abscess, collection or subcutaneous mass is identified. IMPRESSION: Targeted ultrasound evaluation of the indicated area of concern reveals mild induration and subcutaneous edema without subjacent collection, abscess or mass. Electronically Signed   By: Lovena Le M.D.   On: 09/26/2020 20:08   ECHOCARDIOGRAM LIMITED  Result Date: 09/27/2020    ECHOCARDIOGRAM LIMITED REPORT    Patient Name:   Antonio Phillips [facility] Date of Exam: 09/27/2020 Medical Rec #:  [id number]          Height:       71.0 in Accession #:    [id number]         Weight:       216.0 lb Date of Birth:  08/16/1945          BSA:          2.179 m Patient Age:    43 years           BP:           105/58 mmHg Patient Gender: M                  HR:           76 bpm. Exam Location:  [facility] Procedure: 2D Echo, Cardiac Doppler and Color Doppler Indications:     Chest Pain R07.9  History:         Patient has prior history of Echocardiogram examinations. Risk                  Factors:Hypertension and Diabetes. PAD.  Sonographer:     Alyse Low Roar Referring Phys:  [id number] [place] Diagnosing Phys: Dorris Carnes MD IMPRESSIONS  1. Poor acoustic windows Endocardium is not well seen I would recomm limited echo with Definity to help define LVEF and regional wall motion. . There is mild left ventricular hypertrophy. Left ventricular diastolic parameters are indeterminate.  2. RV is not seen well enough to evaluate function. . The right ventricular size is mildly enlarged.  3. Left atrial size was severely dilated.  4. Right atrial size was severely dilated.  5. Mild mitral valve regurgitation.  6. Tricuspid valve regurgitation is mild to moderate.  7. The aortic valve is tricuspid. Aortic valve regurgitation is not visualized. Mild aortic valve sclerosis is present, with no evidence of aortic valve stenosis. FINDINGS  Left Ventricle: Poor acoustic windows Endocardium is not well seen I would recomm limited echo with Definity to help define LVEF and regional wall motion. The left ventricular internal cavity size was normal in size. There is mild left ventricular hypertrophy. Left ventricular diastolic parameters are indeterminate. Right Ventricle: RV is not seen well enough to evaluate function. The right ventricular size is mildly enlarged. Right vetricular wall thickness was not assessed. Left Atrium: Left atrial size was severely dilated.  Right Atrium: Right atrial size was severely dilated.  Pericardium: There is no evidence of pericardial effusion. Mitral Valve: Mild mitral annular calcification. Mild mitral valve regurgitation. Tricuspid Valve: The tricuspid valve is normal in structure. Tricuspid valve regurgitation is mild to moderate. Aortic Valve: The aortic valve is tricuspid. Aortic valve regurgitation is not visualized. Mild aortic valve sclerosis is present, with no evidence of aortic valve stenosis. Aortic valve peak gradient measures 11.8 mmHg. Pulmonic Valve: The pulmonic valve was normal in structure. Pulmonic valve regurgitation is mild to moderate. Aorta: The aortic root is normal in size and structure. IAS/Shunts: No atrial level shunt detected by color flow Doppler. LEFT VENTRICLE PLAX 2D LVIDd:         4.60 cm  Diastology LVIDs:         3.40 cm  LV e' medial:    5.66 cm/s LV PW:         1.10 cm  LV E/e' medial:  20.1 LV IVS:        1.20 cm  LV e' lateral:   8.70 cm/s LVOT diam:     1.70 cm  LV E/e' lateral: 13.1 LVOT Area:     2.27 cm  RIGHT VENTRICLE RV Mid diam:    3.70 cm RV S prime:     10.10 cm/s TAPSE (M-mode): 1.6 cm LEFT ATRIUM              Index       RIGHT ATRIUM           Index LA diam:        4.50 cm  2.07 cm/m  RA Area:     33.50 cm LA Vol (A2C):   102.0 ml 46.81 ml/m RA Volume:   128.00 ml 58.75 ml/m LA Vol (A4C):   113.0 ml 51.86 ml/m LA Biplane Vol: 112.0 ml 51.40 ml/m  AORTIC VALVE                PULMONIC VALVE AV Area (Vmax): 1.25 cm    PV Vmax:        0.90 m/s AV Vmax:        172.00 cm/s PV Peak grad:   3.2 mmHg AV Peak Grad:   11.8 mmHg   RVOT Peak grad: 1 mmHg LVOT Vmax:      95.10 cm/s  AORTA Ao Root diam: 3.00 cm MITRAL VALVE                TRICUSPID VALVE MV Area (PHT): 4.17 cm     TR Peak grad:   37.7 mmHg MV Decel Time: 182 msec     TR Vmax:        307.00 cm/s MV E velocity: 114.00 cm/s                             SHUNTS                             Systemic Diam: 1.70 cm Dorris Carnes MD  Electronically signed by Dorris Carnes MD Signature Date/Time: 09/27/2020/2:47:51 PM    Final      Medications:    sodium chloride 100 mL (09/26/20 2229)   sodium chloride     cefTRIAXone (ROCEPHIN)  IV Stopped (09/28/20 1041)    apixaban  5 mg Oral BID   aspirin  81 mg Oral Daily   atorvastatin  40 mg Oral QHS   Chlorhexidine Gluconate Cloth  6 each Topical Q0600   clopidogrel  75 mg Oral Q breakfast   finasteride  5 mg Oral Daily   loratadine  10 mg Oral Once per day on Sun Mon Wed Fri   metoprolol tartrate  12.5 mg Oral BID   [START ON 09/29/2020] midodrine  10 mg Oral Q T,Th,Sa-HD   midodrine  5 mg Oral Daily   pregabalin  75 mg Oral QHS   rOPINIRole  1 mg Oral QHS   sevelamer carbonate  1,600 mg Oral 3 times per day on Sun Mon Wed Fri   sevelamer carbonate  800 mg Oral 2 times per day on Tue Thu Sat   vitamin B-12  1,000 mcg Oral Daily   sodium chloride, sodium chloride, acetaminophen **OR** acetaminophen, alteplase, diclofenac Sodium, heparin, HYDROcodone-acetaminophen, lidocaine (PF), lidocaine-prilocaine, morphine injection, ondansetron **OR** ondansetron (ZOFRAN) IV, pentafluoroprop-tetrafluoroeth, polyethylene glycol, polyvinyl alcohol  Assessment/ Plan:  Antonio Phillips is a 75 y.o.  male with a past medical history of hypertension, CAD, rt BKA, and ESRD on dialysis. He presented to the ED from his SNF for chest pain and shortness of breath.   [facility]/TTS/Lt AVF  1. End stage renal disease on dialysis Will maintain outpatient schedule during this admission Received dialysis on Saturday Next treatment on tomorrow  2. Anemia of chronic kidney disease   Lab Results  Component Value Date   HGB 8.3 (L) 09/28/2020   Hgb below target Will monitor    3. Secondary Hyperparathyroidism:   Lab Results  Component Value Date   CALCIUM 8.7 (L) 09/28/2020   PHOS 3.7 09/27/2020  Phosphorus at goal Renvela with meals      LOS: 2 Antonio Phillips 7/25/202212:37 PM

## 2020-09-28 NOTE — ED Notes (Signed)
Patient to 82 with Claiborne Billings, RN.

## 2020-09-28 NOTE — ED Notes (Signed)
Patient provided inpatient ordering menu and phone to call for dinner.

## 2020-09-28 NOTE — ED Notes (Signed)
Patient is resting comfortably. 

## 2020-09-28 NOTE — ED Notes (Signed)
Cardiology at bedside.

## 2020-09-28 NOTE — ED Notes (Signed)
Patient to be transported to inpatient unit via ER RN.

## 2020-09-28 NOTE — ED Notes (Signed)
Patient notified of inpatient bed assignment.

## 2020-09-28 NOTE — Progress Notes (Signed)
*  PRELIMINARY RESULTS* Echocardiogram 2D Echocardiogram has been performed.  Sherrie Sport 09/28/2020, 10:57 AM

## 2020-09-28 NOTE — Progress Notes (Signed)
PROGRESS NOTE    Antonio Phillips  [id number] DOB: 27-Mar-1945 DOA: 09/26/2020 PCP: Marsh Dolly, MD  Brief Narrative: 75 year old male from [facility] SNF with history of ESRD on hemodialysis Tuesday Thursday Saturday, chronic hypotension on midodrine, severe PAD status post left AKA and right BKA, permanent atrial fibrillation on Eliquis, history of recent non-STEMI treated with PCI and stenting to OM 2 on 7/19 -Presented to the ED with chest pain and severe chills -On further work-up he was noted to have E. coli bacteremia   Assessment & Plan:  Ecoli bacteremia -Some symptoms of dysuria, still makes some urine -FU urinalysis and urine culture -Continue IV ceftriaxone -Will repeat blood cultures   Chest pain -Resolved, ruled out for ACS, mildly elevated high-sensitivity troponin likely from ESRD, recent non-STEMI  Recent NSTEMI.  Discharged on 7/20 - Cardiac cath showed single-vessel coronary artery disease with 99% stenosis of the second marginal lesion.  DES was placed 09/22/20 -Discharged on aspirin for 30 days, Plavix for minimum of 6 months -Eliquis was resumed at discharge for A. fib   ESRD on hemodialysis TTS -Nephrology following, underwent dialysis yesterday  Peripheral vascular disease left AKA and right BKA -Continue Plavix, statin  Chronic atrial fibrillation -Heart rate improved, continue low-dose metoprolol and Eliquis  Chronic hypotension -Midodrine resumed May need to increase dose  Anemia of chronic disease -Hemoglobin 7.8 now, baseline around 8.5, check anemia panel -Will need EPO with HD   DVT prophylaxis: Apixaban Code Status: Full code Family Communication: Discussed patient in detail, no family at bedside Disposition Plan:  Status is: Inpatient  Remains inpatient appropriate because:Inpatient level of care appropriate due to severity of illness  Dispo: The patient is from: SNF              Anticipated d/c is to: SNF              Patient  currently is not medically stable to d/c.   Difficult to place patient No        Consultants:  Cardiology  Procedures:   Antimicrobials:    Subjective: -Feels okay, no specific complaints, denies any chest pain  Objective: Vitals:   09/28/20 0700 09/28/20 1029 09/28/20 1030 09/28/20 1300  BP: (!) 100/56 120/65 116/61 115/69  Pulse: 60 85 89 100  Resp: '13  14 13  '$ Temp:      TempSrc:      SpO2: 99%  95% 99%  Weight:        Intake/Output Summary (Last 24 hours) at 09/28/2020 1337 Last data filed at 09/28/2020 1041 Gross per 24 hour  Intake 100 ml  Output --  Net 100 ml   Filed Weights   09/26/20 1230  Weight: 98 kg    Examination:  General exam: Obese chronically ill male sitting up in bed, AAOx3, no distress HEENT: Neck obese, unable to assess JVD CVS: S1-S2, regular rate rhythm Lungs: Decreased breath sounds to bases Abdomen: Obese, soft, nontender, bowel sounds present Extremities: Left AKA, right BKA  skin: No rash on exposed skin Psychiatry: Mood & affect appropriate.     Data Reviewed:   CBC: Recent Labs  Lab 09/23/20 0413 09/26/20 1233 09/26/20 1542 09/27/20 0707 09/28/20 0742  WBC 5.3 5.7 8.3 5.9 5.9  HGB 8.6* 8.6* 10.4* 7.8* 8.3*  HCT 27.3* 27.2* 33.7* 24.5* 26.3*  MCV 95.5 99.3 99.1 99.6 98.1  PLT 106* 107* 139* 96* [id number]*   Basic Metabolic Panel: Recent Labs  Lab 09/23/20 0413 09/26/20 1233 09/26/20  1542 09/27/20 0707 09/28/20 0742  NA 134* 137 135 135 134*  K 4.9 4.6 6.3* 3.8 4.4  CL 95* 102 98 99 98  CO2 27 26 21* 25 23  GLUCOSE 116* 128* 101* 113* 91  BUN 34* 41* 41* 26* 35*  CREATININE 4.84* 5.66* 6.00* 4.11* 5.31*  CALCIUM 8.8* 8.6* 9.3 8.6* 8.7*  PHOS  --   --  4.9* 3.7  --    GFR: Estimated Creatinine Clearance: 14.3 mL/min (A) (by C-G formula based on SCr of 5.31 mg/dL (H)). Liver Function Tests: Recent Labs  Lab 09/26/20 1542 09/27/20 0707  ALBUMIN 3.6 2.7*   No results for input(s): LIPASE, AMYLASE in the  last 168 hours. No results for input(s): AMMONIA in the last 168 hours. Coagulation Profile: Recent Labs  Lab 09/26/20 1233  INR 1.7*   Cardiac Enzymes: No results for input(s): CKTOTAL, CKMB, CKMBINDEX, TROPONINI in the last 168 hours. BNP (last 3 results) No results for input(s): PROBNP in the last 8760 hours. HbA1C: No results for input(s): HGBA1C in the last 72 hours. CBG: Recent Labs  Lab 09/26/20 1510 09/27/20 0853  GLUCAP 102* 122*   Lipid Profile: No results for input(s): CHOL, HDL, LDLCALC, TRIG, CHOLHDL, LDLDIRECT in the last 72 hours. Thyroid Function Tests: No results for input(s): TSH, T4TOTAL, FREET4, T3FREE, THYROIDAB in the last 72 hours. Anemia Panel: Recent Labs    09/27/20 0707  VITAMINB12 1,085*  FOLATE 10.4  FERRITIN 1,692*  TIBC 162*  IRON 38*  RETICCTPCT 2.0   Urine analysis:    Component Value Date/Time   COLORURINE YELLOW (A) 09/27/2020 1455   APPEARANCEUR HAZY (A) 09/27/2020 1455   APPEARANCEUR Hazy 11/14/2012 2044   LABSPEC 1.009 09/27/2020 1455   LABSPEC 1.016 11/14/2012 2044   PHURINE 9.0 (H) 09/27/2020 1455   GLUCOSEU 150 (A) 09/27/2020 1455   GLUCOSEU 150 mg/dL 11/14/2012 2044   HGBUR SMALL (A) 09/27/2020 1455   BILIRUBINUR NEGATIVE 09/27/2020 1455   BILIRUBINUR Negative 11/14/2012 2044   KETONESUR NEGATIVE 09/27/2020 1455   PROTEINUR 100 (A) 09/27/2020 1455   NITRITE NEGATIVE 09/27/2020 1455   LEUKOCYTESUR LARGE (A) 09/27/2020 1455   LEUKOCYTESUR Trace 11/14/2012 2044   Sepsis Labs: '@LABRCNTIP'$ (procalcitonin:4,lacticidven:4)  ) Recent Results (from the past 240 hour(s))  SARS CORONAVIRUS 2 (TAT 6-24 HRS) Nasopharyngeal Nasopharyngeal Swab     Status: None   Collection Time: 09/20/20  9:27 PM   Specimen: Nasopharyngeal Swab  Result Value Ref Range Status   SARS Coronavirus 2 NEGATIVE NEGATIVE Final    Comment: (NOTE) SARS-CoV-2 target nucleic acids are NOT DETECTED.  The SARS-CoV-2 RNA is generally detectable in upper  and lower respiratory specimens during the acute phase of infection. Negative results do not preclude SARS-CoV-2 infection, do not rule out co-infections with other pathogens, and should not be used as the sole basis for treatment or other patient management decisions. Negative results must be combined with clinical observations, patient history, and epidemiological information. The expected result is Negative.  Fact Sheet for Patients: SugarRoll.be  Fact Sheet for Healthcare Providers: https://www.woods-mathews.com/  This test is not yet approved or cleared by the [place] FDA and  has been authorized for detection and/or diagnosis of SARS-CoV-2 by FDA under an Emergency Use Authorization (EUA). This EUA will remain  in effect (meaning this test can be used) for the duration of the COVID-19 declaration under Se ction 564(b)(1) of the Act, 21 U.S.C. section 360bbb-3(b)(1), unless the authorization is terminated or revoked sooner.  Performed  at [facility], [place] [address], [place], [place] [address]   MRSA Next Gen by PCR, Nasal     Status: Abnormal   Collection Time: 09/21/20  1:20 PM   Specimen: Nasal Mucosa; Nasal Swab  Result Value Ref Range Status   MRSA by PCR Next Gen DETECTED (A) NOT DETECTED Final    Comment: CRITICAL RESULT CALLED TO, READ BACK BY AND VERIFIED WITH: Arnetha Courser [id number] 09/21/20 GM (NOTE) The GeneXpert MRSA Assay (FDA approved for NASAL specimens only), is one component of a comprehensive MRSA colonization surveillance program. It is not intended to diagnose MRSA infection nor to guide or monitor treatment for MRSA infections. Test performance is not FDA approved in patients less than 52 years old. Performed at [facility], [place], [place] [address]   Resp Panel by RT-PCR (Flu A&B, Covid) Nasopharyngeal Swab     Status: None   Collection Time: 09/26/20  4:04 PM    Specimen: Nasopharyngeal Swab; Nasopharyngeal(NP) swabs in vial transport medium  Result Value Ref Range Status   SARS Coronavirus 2 by RT PCR NEGATIVE NEGATIVE Final    Comment: (NOTE) SARS-CoV-2 target nucleic acids are NOT DETECTED.  The SARS-CoV-2 RNA is generally detectable in upper respiratory specimens during the acute phase of infection. The lowest concentration of SARS-CoV-2 viral copies this assay can detect is 138 copies/mL. A negative result does not preclude SARS-Cov-2 infection and should not be used as the sole basis for treatment or other patient management decisions. A negative result may occur with  improper specimen collection/handling, submission of specimen other than nasopharyngeal swab, presence of viral mutation(s) within the areas targeted by this assay, and inadequate number of viral copies(<138 copies/mL). A negative result must be combined with clinical observations, patient history, and epidemiological information. The expected result is Negative.  Fact Sheet for Patients:  EntrepreneurPulse.com.au  Fact Sheet for Healthcare Providers:  IncredibleEmployment.be  This test is no t yet approved or cleared by the [place] FDA and  has been authorized for detection and/or diagnosis of SARS-CoV-2 by FDA under an Emergency Use Authorization (EUA). This EUA will remain  in effect (meaning this test can be used) for the duration of the COVID-19 declaration under Section 564(b)(1) of the Act, 21 U.S.C.section 360bbb-3(b)(1), unless the authorization is terminated  or revoked sooner.       Influenza A by PCR NEGATIVE NEGATIVE Final   Influenza B by PCR NEGATIVE NEGATIVE Final    Comment: (NOTE) The Xpert Xpress SARS-CoV-2/FLU/RSV plus assay is intended as an aid in the diagnosis of influenza from Nasopharyngeal swab specimens and should not be used as a sole basis for treatment. Nasal washings and aspirates are  unacceptable for Xpert Xpress SARS-CoV-2/FLU/RSV testing.  Fact Sheet for Patients: EntrepreneurPulse.com.au  Fact Sheet for Healthcare Providers: IncredibleEmployment.be  This test is not yet approved or cleared by the [place] FDA and has been authorized for detection and/or diagnosis of SARS-CoV-2 by FDA under an Emergency Use Authorization (EUA). This EUA will remain in effect (meaning this test can be used) for the duration of the COVID-19 declaration under Section 564(b)(1) of the Act, 21 U.S.C. section 360bbb-3(b)(1), unless the authorization is terminated or revoked.  Performed at [facility], [address] [place], [place] [address]   Blood culture (routine x 2)     Status: Abnormal (Preliminary result)   Collection Time: 09/26/20  4:06 PM   Specimen: BLOOD  Result Value Ref Range Status  Specimen Description   Final    BLOOD BLOOD RIGHT ARM Performed at [facility], [address] [place], [place] [address]    Special Requests   Final    BOTTLES DRAWN AEROBIC AND ANAEROBIC Blood Culture results may not be optimal due to an inadequate volume of blood received in culture bottles Performed at [facility], [address] [place], [place] [address]    Culture  Setup Time   Final    Organism ID to follow [place] CRITICAL RESULT CALLED TO, READ BACK BY AND VERIFIED WITH: Enrique Sack AT [place] 09/27/2020 DLB Performed at [facility], [address] [place], [place] [address]    Culture (A)  Final    ESCHERICHIA COLI SUSCEPTIBILITIES TO FOLLOW Performed at [facility], [place] [address], [place], [place] [address]    Report Status PENDING  Incomplete  Blood Culture ID Panel (Reflexed)     Status: Abnormal   Collection Time: 09/26/20  4:06 PM  Result Value Ref Range Status   Enterococcus faecalis NOT DETECTED NOT DETECTED Final   Enterococcus  Faecium NOT DETECTED NOT DETECTED Final   Listeria monocytogenes NOT DETECTED NOT DETECTED Final   Staphylococcus species NOT DETECTED NOT DETECTED Final   Staphylococcus aureus (BCID) NOT DETECTED NOT DETECTED Final   Staphylococcus epidermidis NOT DETECTED NOT DETECTED Final   Staphylococcus lugdunensis NOT DETECTED NOT DETECTED Final   Streptococcus species NOT DETECTED NOT DETECTED Final   Streptococcus agalactiae NOT DETECTED NOT DETECTED Final   Streptococcus pneumoniae NOT DETECTED NOT DETECTED Final   Streptococcus pyogenes NOT DETECTED NOT DETECTED Final   A.calcoaceticus-baumannii NOT DETECTED NOT DETECTED Final   Bacteroides fragilis NOT DETECTED NOT DETECTED Final   Enterobacterales DETECTED (A) NOT DETECTED Final    Comment: Enterobacterales represent a large order of gram negative bacteria, not a single organism. CRITICAL RESULT CALLED TO, READ BACK BY AND VERIFIED WITH: JASON ROBERTS AT 0410 09/27/2020 DLB    Enterobacter cloacae complex NOT DETECTED NOT DETECTED Final   Escherichia coli DETECTED (A) NOT DETECTED Final    Comment: CRITICAL RESULT CALLED TO, READ BACK BY AND VERIFIED WITH: JASON ROBERTS AT 0410 09/27/2020 DLB    Klebsiella aerogenes NOT DETECTED NOT DETECTED Final   Klebsiella oxytoca NOT DETECTED NOT DETECTED Final   Klebsiella pneumoniae NOT DETECTED NOT DETECTED Final   Proteus species NOT DETECTED NOT DETECTED Final   Salmonella species NOT DETECTED NOT DETECTED Final   Serratia marcescens NOT DETECTED NOT DETECTED Final   Haemophilus influenzae NOT DETECTED NOT DETECTED Final   Neisseria meningitidis NOT DETECTED NOT DETECTED Final   Pseudomonas aeruginosa NOT DETECTED NOT DETECTED Final   Stenotrophomonas maltophilia NOT DETECTED NOT DETECTED Final   Candida albicans NOT DETECTED NOT DETECTED Final   Candida auris NOT DETECTED NOT DETECTED Final   Candida glabrata NOT DETECTED NOT DETECTED Final   Candida krusei NOT DETECTED NOT DETECTED Final    Candida parapsilosis NOT DETECTED NOT DETECTED Final   Candida tropicalis NOT DETECTED NOT DETECTED Final   Cryptococcus neoformans/gattii NOT DETECTED NOT DETECTED Final   CTX-M ESBL NOT DETECTED NOT DETECTED Final   Carbapenem resistance IMP NOT DETECTED NOT DETECTED Final   Carbapenem resistance KPC NOT DETECTED NOT DETECTED Final   Carbapenem resistance NDM NOT DETECTED NOT DETECTED Final   Carbapenem resist OXA 48 LIKE NOT DETECTED NOT DETECTED Final   Carbapenem resistance VIM NOT DETECTED NOT DETECTED Final  Comment: Performed at [facility], [address] [place], [place] [address]  Blood culture (routine x 2)     Status: None (Preliminary result)   Collection Time: 09/26/20  6:00 PM   Specimen: BLOOD  Result Value Ref Range Status   Specimen Description BLOOD RIGHT ANTECUBITAL  Final   Special Requests   Final    BOTTLES DRAWN AEROBIC AND ANAEROBIC Blood Culture results may not be optimal due to an inadequate volume of blood received in culture bottles   Culture   Final    NO GROWTH 2 DAYS Performed at [facility], [address] [place], [place] [address]    Report Status PENDING  Incomplete  Urine Culture     Status: None   Collection Time: 09/27/20  2:55 PM   Specimen: Urine, Random  Result Value Ref Range Status   Specimen Description   Final    URINE, RANDOM Performed at [facility], [address], [place], [place] [address]    Special Requests   Final    NONE Performed at [facility], [address] [place], [place] [address]    Culture   Final    NO GROWTH Performed at [facility], [place] [address], [place],  [address]    Report Status 09/28/2020 FINAL  Final         Radiology Studies: [place] LT LOWER EXTREM LTD SOFT TISSUE NON VASCULAR  Result Date: 09/26/2020 CLINICAL DATA:  Wound on left buttock, concern for abscess EXAM: ULTRASOUND LEFT LOWER EXTREMITY LIMITED TECHNIQUE: Ultrasound  examination of the lower extremity soft tissues was performed in the area of clinical concern. COMPARISON:  None. FINDINGS: Targeted grayscale and color Doppler ultrasound evaluation was performed at the site of reported wound in the region of the left buttock. Mild superficial soft tissue edematous changes are noted. No discrete abscess, collection or subcutaneous mass is identified. IMPRESSION: Targeted ultrasound evaluation of the indicated area of concern reveals mild induration and subcutaneous edema without subjacent collection, abscess or mass. Electronically Signed   By: Lovena Le M.D.   On: 09/26/2020 20:08   ECHOCARDIOGRAM LIMITED  Result Date: 09/27/2020    ECHOCARDIOGRAM LIMITED REPORT   Patient Name:   Antonio Phillips [facility] Date of Exam: 09/27/2020 Medical Rec #:  [id number]          Height:       71.0 in Accession #:    [id number]         Weight:       216.0 lb Date of Birth:  Jun 02, 1945          BSA:          2.179 m Patient Age:    3 years           BP:           105/58 mmHg Patient Gender: M                  HR:           76 bpm. Exam Location:  [facility] Procedure: 2D Echo, Cardiac Doppler and Color Doppler Indications:     Chest Pain R07.9  History:         Patient has prior history of Echocardiogram examinations. Risk                  Factors:Hypertension and Diabetes. PAD.  Sonographer:     Alyse Low Roar Referring Phys:  [phone] [place]  H FURTH Diagnosing Phys: Dorris Carnes MD IMPRESSIONS  1. Poor acoustic windows Endocardium is not well seen I would recomm limited echo with Definity to help define LVEF and regional wall motion. . There is mild left ventricular hypertrophy. Left ventricular diastolic parameters are indeterminate.  2. RV is not seen well enough to evaluate function. . The right ventricular size is mildly enlarged.  3. Left atrial size was severely dilated.  4. Right atrial size was severely dilated.  5. Mild mitral valve regurgitation.  6. Tricuspid valve regurgitation is mild to  moderate.  7. The aortic valve is tricuspid. Aortic valve regurgitation is not visualized. Mild aortic valve sclerosis is present, with no evidence of aortic valve stenosis. FINDINGS  Left Ventricle: Poor acoustic windows Endocardium is not well seen I would recomm limited echo with Definity to help define LVEF and regional wall motion. The left ventricular internal cavity size was normal in size. There is mild left ventricular hypertrophy. Left ventricular diastolic parameters are indeterminate. Right Ventricle: RV is not seen well enough to evaluate function. The right ventricular size is mildly enlarged. Right vetricular wall thickness was not assessed. Left Atrium: Left atrial size was severely dilated. Right Atrium: Right atrial size was severely dilated. Pericardium: There is no evidence of pericardial effusion. Mitral Valve: Mild mitral annular calcification. Mild mitral valve regurgitation. Tricuspid Valve: The tricuspid valve is normal in structure. Tricuspid valve regurgitation is mild to moderate. Aortic Valve: The aortic valve is tricuspid. Aortic valve regurgitation is not visualized. Mild aortic valve sclerosis is present, with no evidence of aortic valve stenosis. Aortic valve peak gradient measures 11.8 mmHg. Pulmonic Valve: The pulmonic valve was normal in structure. Pulmonic valve regurgitation is mild to moderate. Aorta: The aortic root is normal in size and structure. IAS/Shunts: No atrial level shunt detected by color flow Doppler. LEFT VENTRICLE PLAX 2D LVIDd:         4.60 cm  Diastology LVIDs:         3.40 cm  LV e' medial:    5.66 cm/s LV PW:         1.10 cm  LV E/e' medial:  20.1 LV IVS:        1.20 cm  LV e' lateral:   8.70 cm/s LVOT diam:     1.70 cm  LV E/e' lateral: 13.1 LVOT Area:     2.27 cm  RIGHT VENTRICLE RV Mid diam:    3.70 cm RV S prime:     10.10 cm/s TAPSE (M-mode): 1.6 cm LEFT ATRIUM              Index       RIGHT ATRIUM           Index LA diam:        4.50 cm  2.07 cm/m  RA  Area:     33.50 cm LA Vol (A2C):   102.0 ml 46.81 ml/m RA Volume:   128.00 ml 58.75 ml/m LA Vol (A4C):   113.0 ml 51.86 ml/m LA Biplane Vol: 112.0 ml 51.40 ml/m  AORTIC VALVE                PULMONIC VALVE AV Area (Vmax): 1.25 cm    PV Vmax:        0.90 m/s AV Vmax:        172.00 cm/s PV Peak grad:   3.2 mmHg AV Peak Grad:   11.8 mmHg   RVOT Peak grad: 1 mmHg LVOT Vmax:  95.10 cm/s  AORTA Ao Root diam: 3.00 cm MITRAL VALVE                TRICUSPID VALVE MV Area (PHT): 4.17 cm     TR Peak grad:   37.7 mmHg MV Decel Time: 182 msec     TR Vmax:        307.00 cm/s MV E velocity: 114.00 cm/s                             SHUNTS                             Systemic Diam: 1.70 cm Dorris Carnes MD Electronically signed by Dorris Carnes MD Signature Date/Time: 09/27/2020/2:47:51 PM    Final         Scheduled Meds:  apixaban  5 mg Oral BID   aspirin  81 mg Oral Daily   atorvastatin  40 mg Oral QHS   Chlorhexidine Gluconate Cloth  6 each Topical Q0600   clopidogrel  75 mg Oral Q breakfast   finasteride  5 mg Oral Daily   loratadine  10 mg Oral Once per day on Sun Mon Wed Fri   metoprolol tartrate  12.5 mg Oral BID   [START ON 09/29/2020] midodrine  10 mg Oral Q T,Th,Sa-HD   midodrine  5 mg Oral Daily   pregabalin  75 mg Oral QHS   rOPINIRole  1 mg Oral QHS   sevelamer carbonate  1,600 mg Oral 3 times per day on Sun Mon Wed Fri   sevelamer carbonate  800 mg Oral 2 times per day on Tue Thu Sat   vitamin B-12  1,000 mcg Oral Daily   Continuous Infusions:  sodium chloride 100 mL (09/26/20 2229)   sodium chloride     cefTRIAXone (ROCEPHIN)  IV Stopped (09/28/20 1041)     LOS: 2 days    Time spent: 18mn  PDomenic Polite MD [facility]   09/28/2020, 1:37 PM

## 2020-09-28 NOTE — ED Notes (Signed)
Floor contacted about delay in handoff for patient. Patient notified of continued delay. Patient disgruntled, but verbalized understanding.

## 2020-09-28 NOTE — ED Notes (Signed)
Per Parke Simmers, RN on 2A, via secure chat, patient ok to be transported to inpatient unit.

## 2020-09-28 NOTE — Progress Notes (Signed)
Progress Note  Patient Name: Antonio Phillips Date of Encounter: 09/28/2020  Primary Cardiologist: Rockey Situ  Subjective   No further chest pain since 7/23. He does not recall missing any doses of DAPT or [place]. Repeat limited echo with Definity pending this morning.   Inpatient Medications    Scheduled Meds:  apixaban  5 mg Oral BID   aspirin  81 mg Oral Daily   atorvastatin  40 mg Oral QHS   Chlorhexidine Gluconate Cloth  6 each Topical Q0600   clopidogrel  75 mg Oral Q breakfast   finasteride  5 mg Oral Daily   loratadine  10 mg Oral Once per day on Sun Mon Wed Fri   metoprolol tartrate  12.5 mg Oral BID   [START ON 09/29/2020] midodrine  10 mg Oral Q T,Th,Sa-HD   midodrine  5 mg Oral Daily   pregabalin  75 mg Oral QHS   rOPINIRole  1 mg Oral QHS   sevelamer carbonate  1,600 mg Oral 3 times per day on Sun Mon Wed Fri   sevelamer carbonate  800 mg Oral 2 times per day on Tue Thu Sat   vitamin B-12  1,000 mcg Oral Daily   Continuous Infusions:  sodium chloride 100 mL (09/26/20 2229)   sodium chloride     cefTRIAXone (ROCEPHIN)  IV Stopped (09/28/20 1041)   PRN Meds: sodium chloride, sodium chloride, acetaminophen **OR** acetaminophen, alteplase, diclofenac Sodium, heparin, HYDROcodone-acetaminophen, lidocaine (PF), lidocaine-prilocaine, morphine injection, ondansetron **OR** ondansetron (ZOFRAN) IV, pentafluoroprop-tetrafluoroeth, polyethylene glycol, polyvinyl alcohol   Vital Signs    Vitals:   09/28/20 0130 09/28/20 0700 09/28/20 1029 09/28/20 1030  BP: 91/62 (!) 100/56 120/65 116/61  Pulse: 75 60 85 89  Resp: '12 13  14  '$ Temp:      TempSrc:      SpO2: 98% 99%  95%  Weight:        Intake/Output Summary (Last 24 hours) at 09/28/2020 1112 Last data filed at 09/28/2020 1041 Gross per 24 hour  Intake 100 ml  Output --  Net 100 ml   Filed Weights   09/26/20 1230  Weight: 98 kg    Telemetry    Afib. 90s to low 100s bpm - Personally Reviewed  ECG    No  new tracings - Personally Reviewed  Physical Exam   GEN: No acute distress.   Neck: No JVD. Cardiac: IRIR, II/VI systolic murmur LSB, no rubs, or gallops.  Respiratory: Clear to auscultation bilaterally.  GI: Soft, nontender, non-distended.   MS: No edema; No deformity. Neuro:  Alert and oriented x 3; Nonfocal.  Psych: Normal affect.  Labs    Chemistry Recent Labs  Lab 09/26/20 1542 09/27/20 0707 09/28/20 0742  NA 135 135 134*  K 6.3* 3.8 4.4  CL 98 99 98  CO2 21* 25 23  GLUCOSE 101* 113* 91  BUN 41* 26* 35*  CREATININE 6.00* 4.11* 5.31*  CALCIUM 9.3 8.6* 8.7*  ALBUMIN 3.6 2.7*  --   GFRNONAA 9* 14* 11*  ANIONGAP 16* 11 13     Hematology Recent Labs  Lab 09/26/20 1542 09/27/20 0707 09/28/20 0742  WBC 8.3 5.9 5.9  RBC 3.40* 2.46*  2.52* 2.68*  HGB 10.4* 7.8* 8.3*  HCT 33.7* 24.5* 26.3*  MCV 99.1 99.6 98.1  MCH 30.6 31.7 31.0  MCHC 30.9 31.8 31.6  RDW 17.3* 17.6* 17.6*  PLT 139* 96* 103*    Cardiac EnzymesNo results for input(s): TROPONINI in the last 168  hours. No results for input(s): TROPIPOC in the last 168 hours.   BNPNo results for input(s): BNP, PROBNP in the last 168 hours.   DDimer No results for input(s): DDIMER in the last 168 hours.   Radiology    DG Chest 2 View  Result Date: 09/26/2020 IMPRESSION: Cardiomegaly with persistent mild interstitial prominence likely reflecting a degree of chronic underlying pulmonary edema. Electronically Signed   By: Valentino Saxon MD   On: 09/26/2020 13:12   [place] LT LOWER EXTREM LTD SOFT TISSUE NON VASCULAR  Result Date: 09/26/2020 IMPRESSION: Targeted ultrasound evaluation of the indicated area of concern reveals mild induration and subcutaneous edema without subjacent collection, abscess or mass. Electronically Signed   By: Lovena Le M.D.   On: 09/26/2020 20:08   Cardiac Studies   Limited 2D echo with Definity 09/28/2020: Pending __________  2D echo 09/27/2020:  1. Poor acoustic windows  Endocardium is not well seen I would recomm  limited echo with Definity to help define LVEF and regional wall motion. .  There is mild left ventricular hypertrophy. Left ventricular diastolic  parameters are indeterminate.   2. RV is not seen well enough to evaluate function. . The right  ventricular size is mildly enlarged.   3. Left atrial size was severely dilated.   4. Right atrial size was severely dilated.   5. Mild mitral valve regurgitation.   6. Tricuspid valve regurgitation is mild to moderate.   7. The aortic valve is tricuspid. Aortic valve regurgitation is not  visualized. Mild aortic valve sclerosis is present, with no evidence of  aortic valve stenosis.  __________  LHC 09/22/2020:   Ost LAD to Prox LAD lesion is 50% stenosed.   2nd Mrg lesion is 99% stenosed.   A drug-eluting stent was successfully placed using a STENT RESOLUTE ONYX 2.5X15.   Post intervention, there is a 30% residual stenosis.   1.  Severe single-vessel coronary artery disease with 99% subtotal stenosis of the second obtuse marginal branch, treated with a 2.5 x 15 mm resolute Onyx DES.  30% residual stenosis at the case completion due to resistant/calcified lesion type even with high-pressure noncompliant balloon angioplasty. 2.  Mild to moderate nonobstructive proximal LAD stenosis 3.  Mild nonobstructive RCA stenosis  Recommendations: Aggressive medical therapy, as long as no bleeding complications arise, would resume apixaban tomorrow, aspirin 81 mg daily x30 days, and clopidogrel 75 mg daily x minimum 6 months. ___________  2D echo 09/21/2020: 1. Left ventricular ejection fraction, by estimation, is 60 to 65%. The  left ventricle has normal function. The left ventricle has no regional  wall motion abnormalities. Left ventricular diastolic parameters are  indeterminate.   2. Right ventricular systolic function is normal. The right ventricular  size is normal. Tricuspid regurgitation signal is  inadequate for assessing  PA pressure.   3. Left atrial size was moderately dilated.   4. Right atrial size was moderately dilated.   5. Tricuspid valve regurgitation is mild to moderate.   6. The mitral valve is normal in structure. Mild mitral valve  regurgitation.   7. Rhythym is atrial fibrillation    Patient Profile     75 y.o. 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  Assessment & Plan    1. CAD with recent NSTEMI earlier this month s/p PCI to OM2 with chest pain: -Chest pain free since 7/23 -High sensitivity troponin mildly elevated and flat trending at 144, likely supply demand ischemia in the setting of ESRD, anemia, and E coli bacteremia  -Initial echo this admission with poor acoustic windows, repeat limited echo with Definity to help define LVEF and wall motion, further recommendations based on echo with Definity  -At time of his LHC on 09/22/2020, recommendation was for concurrent ASA 81 mg for 1 month and Plavix 75 mg for 6 months, with continuation of Eliquis  2. Permament Afib: -Ventricular rates well controlled  -Metoprolol  -CHADS2VASc at least 4 (age x 2, DM, vascular disease) -PTA Eliquis  3. HLD: -LDL 22 this month -Lipitor  4. History of hypotension: -PTA midodrine  5. ESRD: -HD  6. Anemia of chronic disease: -Low, though stable, currently 8.3 -Monitor on triple therapy  -EPO with HD  7. E coli bacteremia: -Symptoms of dysuria (still makes some urine) -Per IM  For questions or updates, please contact [place] Please consult [email or web address] for contact info under Cardiology/STEMI.    Signed, Christell Faith, PA-C [place] Pager: [phone] 09/28/2020, 11:12  AM

## 2020-09-29 ENCOUNTER — Encounter: Payer: Self-pay | Admitting: Internal Medicine

## 2020-09-29 DIAGNOSIS — I4821 Permanent atrial fibrillation: Secondary | ICD-10-CM | POA: Diagnosis not present

## 2020-09-29 DIAGNOSIS — I248 Other forms of acute ischemic heart disease: Secondary | ICD-10-CM | POA: Diagnosis not present

## 2020-09-29 LAB — CBC
HCT: 25.3 % — ABNORMAL LOW (ref 39.0–52.0)
Hemoglobin: 8.2 g/dL — ABNORMAL LOW (ref 13.0–17.0)
MCH: 31.7 pg (ref 26.0–34.0)
MCHC: 32.4 g/dL (ref 30.0–36.0)
MCV: 97.7 fL (ref 80.0–100.0)
Platelets: 103 10*3/uL — ABNORMAL LOW (ref 150–400)
RBC: 2.59 MIL/uL — ABNORMAL LOW (ref 4.22–5.81)
RDW: 17.6 % — ABNORMAL HIGH (ref 11.5–15.5)
WBC: 5.5 10*3/uL (ref 4.0–10.5)
nRBC: 0 % (ref 0.0–0.2)

## 2020-09-29 LAB — BASIC METABOLIC PANEL
Anion gap: 12 (ref 5–15)
BUN: 45 mg/dL — ABNORMAL HIGH (ref 8–23)
CO2: 27 mmol/L (ref 22–32)
Calcium: 8.6 mg/dL — ABNORMAL LOW (ref 8.9–10.3)
Chloride: 95 mmol/L — ABNORMAL LOW (ref 98–111)
Creatinine, Ser: 6.02 mg/dL — ABNORMAL HIGH (ref 0.61–1.24)
GFR, Estimated: 9 mL/min — ABNORMAL LOW (ref >60)
Glucose, Bld: 119 mg/dL — ABNORMAL HIGH (ref 70–99)
Potassium: 4.6 mmol/L (ref 3.5–5.1)
Sodium: 134 mmol/L — ABNORMAL LOW (ref 135–145)

## 2020-09-29 LAB — CULTURE, BLOOD (ROUTINE X 2)

## 2020-09-29 MED ORDER — EPOETIN ALFA 10000 UNIT/ML IJ SOLN
4000.0000 [IU] | INTRAMUSCULAR | Status: DC
  Administered 2020-09-29: 4000 [IU] via INTRAVENOUS

## 2020-09-29 MED ORDER — SODIUM CHLORIDE 0.9 % IV SOLN
INTRAVENOUS | Status: DC | PRN
  Administered 2020-09-29 – 2020-09-30 (×2): 250 mL via INTRAVENOUS

## 2020-09-29 NOTE — Progress Notes (Addendum)
PROGRESS NOTE    Antonio Phillips  [id number] DOB: 12-22-1945 DOA: 09/26/2020 PCP: Marsh Dolly, MD  Brief Narrative: 75 year old male from [facility] SNF with history of ESRD on hemodialysis Tuesday Thursday Saturday, chronic hypotension on midodrine, severe PAD status post left AKA and right BKA, permanent atrial fibrillation on Eliquis, history of recent non-STEMI treated with PCI and stenting to OM 2 on 7/19 -Presented to the ED with chest pain and severe chills -On further work-up he was noted to have E. coli bacteremia   Assessment & Plan:  Ecoli bacteremia -continues to have symptoms of dysuria, still makes some urine  -Urinalysis abnormal, unfortunately urine culture was only sent 24 hours after admission, FU cultures -Clinically improving, continue IV ceftriaxone today -Repeat blood cultures are negative so far, if they remain negative can transition to oral Keflex in the next 1 to 2 days  Very small R buttock wound -Reportedly had a boil there that ruptured, appreciate Wound RN input -do not think this is source of bacteremia, healing   Chest pain -Resolved, ruled out for ACS, mildly elevated high-sensitivity troponin likely from ESRD, recent non-STEMI  Recent NSTEMI.  Discharged on 7/20 - Cardiac cath showed single-vessel coronary artery disease with 99% stenosis of the second marginal lesion.  DES was placed 09/22/20 -Discharged on aspirin for 30 days, Plavix for minimum of 6 months -Eliquis was resumed at discharge for A. fib   ESRD on hemodialysis TTS -Nephrology following, HD today  Peripheral vascular disease left AKA and right BKA -Continue Plavix, statin  Chronic atrial fibrillation -Heart rate improved, continue low-dose metoprolol and Eliquis  Chronic hypotension -Midodrine resumed  Anemia of chronic disease -Hemoglobin 7.8 now, baseline around 8.5, anemia panel consistent with chronic disease -Will need EPO with HD   DVT prophylaxis:  Apixaban Code Status: Full code Family Communication: Discussed patient in detail, no family at bedside Disposition Plan:  Status is: Inpatient  Remains inpatient appropriate because:Inpatient level of care appropriate due to severity of illness  Dispo: The patient is from: SNF              Anticipated d/c is to: SNF              Patient currently is not medically stable to d/c.   Difficult to place patient No   Consultants:  Cardiology    Procedures:   Antimicrobials:    Subjective: -Feels okay overall, still with some dysuria, denies any chest pain or shortness of breath, going to dialysis today  Objective: Vitals:   09/29/20 1200 09/29/20 1215 09/29/20 1230 09/29/20 1315  BP: (!) 113/58 (!) 114/100 117/62 (!) 130/55  Pulse:    77  Resp: '14 17 12 16  '$ Temp:    97.8 F (36.6 C)  TempSrc:      SpO2:    100%  Weight:        Intake/Output Summary (Last 24 hours) at 09/29/2020 1506 Last data filed at 09/29/2020 1230 Gross per 24 hour  Intake 240 ml  Output 1504 ml  Net -1264 ml   Filed Weights   09/26/20 1230  Weight: 98 kg    Examination:  General exam: Obese chronically ill male, sitting up in bed, AAOx3, no distress HEENT: No JVD CVS: S1-S2, regular rate rhythm Lungs: Decreased breath sounds to bases Abdomen: Obese, soft, nontender, bowel sounds present Extremities:  Left AKA, right BKA, left arm AVF skin: Very small healing wound on the right buttock, no surrounding erythema tenderness or  discharge, no underlying abscess on exam Psychiatry: Mood & affect appropriate.     Data Reviewed:   CBC: Recent Labs  Lab 09/26/20 1233 09/26/20 1542 09/27/20 0707 09/28/20 0742 09/29/20 0416  WBC 5.7 8.3 5.9 5.9 5.5  HGB 8.6* 10.4* 7.8* 8.3* 8.2*  HCT 27.2* 33.7* 24.5* 26.3* 25.3*  MCV 99.3 99.1 99.6 98.1 97.7  PLT 107* 139* 96* 103* [id number]*   Basic Metabolic Panel: Recent Labs  Lab 09/26/20 1233 09/26/20 1542 09/27/20 0707 09/28/20 0742  09/29/20 0416  NA 137 135 135 134* 134*  K 4.6 6.3* 3.8 4.4 4.6  CL 102 98 99 98 95*  CO2 26 21* '25 23 27  '$ GLUCOSE 128* 101* 113* 91 119*  BUN 41* 41* 26* 35* 45*  CREATININE 5.66* 6.00* 4.11* 5.31* 6.02*  CALCIUM 8.6* 9.3 8.6* 8.7* 8.6*  PHOS  --  4.9* 3.7  --   --    GFR: Estimated Creatinine Clearance: 12.7 mL/min (A) (by C-G formula based on SCr of 6.02 mg/dL (H)). Liver Function Tests: Recent Labs  Lab 09/26/20 1542 09/27/20 0707  ALBUMIN 3.6 2.7*   No results for input(s): LIPASE, AMYLASE in the last 168 hours. No results for input(s): AMMONIA in the last 168 hours. Coagulation Profile: Recent Labs  Lab 09/26/20 1233  INR 1.7*   Cardiac Enzymes: No results for input(s): CKTOTAL, CKMB, CKMBINDEX, TROPONINI in the last 168 hours. BNP (last 3 results) No results for input(s): PROBNP in the last 8760 hours. HbA1C: No results for input(s): HGBA1C in the last 72 hours. CBG: Recent Labs  Lab 09/26/20 1510 09/27/20 0853  GLUCAP 102* 122*   Lipid Profile: No results for input(s): CHOL, HDL, LDLCALC, TRIG, CHOLHDL, LDLDIRECT in the last 72 hours. Thyroid Function Tests: No results for input(s): TSH, T4TOTAL, FREET4, T3FREE, THYROIDAB in the last 72 hours. Anemia Panel: Recent Labs    09/27/20 0707  VITAMINB12 1,085*  FOLATE 10.4  FERRITIN 1,692*  TIBC 162*  IRON 38*  RETICCTPCT 2.0   Urine analysis:    Component Value Date/Time   COLORURINE YELLOW (A) 09/27/2020 1455   APPEARANCEUR HAZY (A) 09/27/2020 1455   APPEARANCEUR Hazy 11/14/2012 2044   LABSPEC 1.009 09/27/2020 1455   LABSPEC 1.016 11/14/2012 2044   PHURINE 9.0 (H) 09/27/2020 1455   GLUCOSEU 150 (A) 09/27/2020 1455   GLUCOSEU 150 mg/dL 11/14/2012 2044   HGBUR SMALL (A) 09/27/2020 1455   BILIRUBINUR NEGATIVE 09/27/2020 1455   BILIRUBINUR Negative 11/14/2012 2044   KETONESUR NEGATIVE 09/27/2020 1455   PROTEINUR 100 (A) 09/27/2020 1455   NITRITE NEGATIVE 09/27/2020 1455   LEUKOCYTESUR LARGE  (A) 09/27/2020 1455   LEUKOCYTESUR Trace 11/14/2012 2044   Sepsis Labs: '@LABRCNTIP'$ (procalcitonin:4,lacticidven:4)  ) Recent Results (from the past 240 hour(s))  SARS CORONAVIRUS 2 (TAT 6-24 HRS) Nasopharyngeal Nasopharyngeal Swab     Status: None   Collection Time: 09/20/20  9:27 PM   Specimen: Nasopharyngeal Swab  Result Value Ref Range Status   SARS Coronavirus 2 NEGATIVE NEGATIVE Final    Comment: (NOTE) SARS-CoV-2 target nucleic acids are NOT DETECTED.  The SARS-CoV-2 RNA is generally detectable in upper and lower respiratory specimens during the acute phase of infection. Negative results do not preclude SARS-CoV-2 infection, do not rule out co-infections with other pathogens, and should not be used as the sole basis for treatment or other patient management decisions. Negative results must be combined with clinical observations, patient history, and epidemiological information. The expected result is Negative.  Fact Sheet for  Patients: SugarRoll.be  Fact Sheet for Healthcare Providers: https://www.woods-mathews.com/  This test is not yet approved or cleared by the [place] FDA and  has been authorized for detection and/or diagnosis of SARS-CoV-2 by FDA under an Emergency Use Authorization (EUA). This EUA will remain  in effect (meaning this test can be used) for the duration of the COVID-19 declaration under Se ction 564(b)(1) of the Act, 21 U.S.C. section 360bbb-3(b)(1), unless the authorization is terminated or revoked sooner.  Performed at [facility], [place] [address], [place], [place] [address]   MRSA Next Gen by PCR, Nasal     Status: Abnormal   Collection Time: 09/21/20  1:20 PM   Specimen: Nasal Mucosa; Nasal Swab  Result Value Ref Range Status   MRSA by PCR Next Gen DETECTED (A) NOT DETECTED Final    Comment: CRITICAL RESULT CALLED TO, READ BACK BY AND VERIFIED WITH: Arnetha Courser [id number] 09/21/20  GM (NOTE) The GeneXpert MRSA Assay (FDA approved for NASAL specimens only), is one component of a comprehensive MRSA colonization surveillance program. It is not intended to diagnose MRSA infection nor to guide or monitor treatment for MRSA infections. Test performance is not FDA approved in patients less than 6 years old. Performed at [facility], [place], [place] [address]   Resp Panel by RT-PCR (Flu A&B, Covid) Nasopharyngeal Swab     Status: None   Collection Time: 09/26/20  4:04 PM   Specimen: Nasopharyngeal Swab; Nasopharyngeal(NP) swabs in vial transport medium  Result Value Ref Range Status   SARS Coronavirus 2 by RT PCR NEGATIVE NEGATIVE Final    Comment: (NOTE) SARS-CoV-2 target nucleic acids are NOT DETECTED.  The SARS-CoV-2 RNA is generally detectable in upper respiratory specimens during the acute phase of infection. The lowest concentration of SARS-CoV-2 viral copies this assay can detect is 138 copies/mL. A negative result does not preclude SARS-Cov-2 infection and should not be used as the sole basis for treatment or other patient management decisions. A negative result may occur with  improper specimen collection/handling, submission of specimen other than nasopharyngeal swab, presence of viral mutation(s) within the areas targeted by this assay, and inadequate number of viral copies(<138 copies/mL). A negative result must be combined with clinical observations, patient history, and epidemiological information. The expected result is Negative.  Fact Sheet for Patients:  EntrepreneurPulse.com.au  Fact Sheet for Healthcare Providers:  IncredibleEmployment.be  This test is no t yet approved or cleared by the [place] FDA and  has been authorized for detection and/or diagnosis of SARS-CoV-2 by FDA under an Emergency Use Authorization (EUA). This EUA will remain  in effect (meaning this test can be  used) for the duration of the COVID-19 declaration under Section 564(b)(1) of the Act, 21 U.S.C.section 360bbb-3(b)(1), unless the authorization is terminated  or revoked sooner.       Influenza A by PCR NEGATIVE NEGATIVE Final   Influenza B by PCR NEGATIVE NEGATIVE Final    Comment: (NOTE) The Xpert Xpress SARS-CoV-2/FLU/RSV plus assay is intended as an aid in the diagnosis of influenza from Nasopharyngeal swab specimens and should not be used as a sole basis for treatment. Nasal washings and aspirates are unacceptable for Xpert Xpress SARS-CoV-2/FLU/RSV testing.  Fact Sheet for Patients: EntrepreneurPulse.com.au  Fact Sheet for Healthcare Providers: IncredibleEmployment.be  This test is not yet approved or cleared by the [place] FDA and has been authorized for detection and/or diagnosis of SARS-CoV-2 by FDA under an Emergency Use Authorization (  EUA). This EUA will remain in effect (meaning this test can be used) for the duration of the COVID-19 declaration under Section 564(b)(1) of the Act, 21 U.S.C. section 360bbb-3(b)(1), unless the authorization is terminated or revoked.  Performed at [facility], [address] [place], [place] [address]   Blood culture (routine x 2)     Status: Abnormal   Collection Time: 09/26/20  4:06 PM   Specimen: BLOOD  Result Value Ref Range Status   Specimen Description   Final    BLOOD BLOOD RIGHT ARM Performed at [facility], [address], [place], [place] [address]    Special Requests   Final    BOTTLES DRAWN AEROBIC AND ANAEROBIC Blood Culture results may not be optimal due to an inadequate volume of blood received in culture bottles Performed at [facility], [place] [place], [place] [address]    Culture  Setup Time   Final    Organism ID to follow [place] CRITICAL RESULT CALLED TO, READ BACK BY AND VERIFIED WITH:  JASON ROBERTS AT [place] 09/27/2020 DLB Performed at [facility], [address] [place], [place] [address]    Culture ESCHERICHIA COLI (A)  Final   Report Status 09/29/2020 FINAL  Final   Organism ID, Bacteria ESCHERICHIA COLI  Final      Susceptibility   Escherichia coli - MIC*    AMPICILLIN 16 INTERMEDIATE Intermediate     CEFAZOLIN <=4 SENSITIVE Sensitive     CEFEPIME <=0.12 SENSITIVE Sensitive     CEFTAZIDIME <=1 SENSITIVE Sensitive     CEFTRIAXONE <=0.25 SENSITIVE Sensitive     CIPROFLOXACIN >=4 RESISTANT Resistant     GENTAMICIN <=1 SENSITIVE Sensitive     IMIPENEM 0.5 SENSITIVE Sensitive     TRIMETH/SULFA >=320 RESISTANT Resistant     AMPICILLIN/SULBACTAM 16 INTERMEDIATE Intermediate     PIP/TAZO 8 SENSITIVE Sensitive     * ESCHERICHIA COLI  Blood Culture ID Panel (Reflexed)     Status: Abnormal   Collection Time: 09/26/20  4:06 PM  Result Value Ref Range Status   Enterococcus faecalis NOT DETECTED NOT DETECTED Final   Enterococcus Faecium NOT DETECTED NOT DETECTED Final   Listeria monocytogenes NOT DETECTED NOT DETECTED Final   Staphylococcus species NOT DETECTED NOT DETECTED Final   Staphylococcus aureus (BCID) NOT DETECTED NOT DETECTED Final   Staphylococcus epidermidis NOT DETECTED NOT DETECTED Final   Staphylococcus lugdunensis NOT DETECTED NOT DETECTED Final   Streptococcus species NOT DETECTED NOT DETECTED Final   Streptococcus agalactiae NOT DETECTED NOT DETECTED Final   Streptococcus pneumoniae NOT DETECTED NOT DETECTED Final   Streptococcus pyogenes NOT DETECTED NOT DETECTED Final   A.calcoaceticus-baumannii NOT DETECTED NOT DETECTED Final   Bacteroides fragilis NOT DETECTED NOT DETECTED Final   Enterobacterales DETECTED (A) NOT DETECTED Final    Comment: Enterobacterales represent a large order of gram negative bacteria, not a single organism. CRITICAL RESULT CALLED TO, READ BACK BY AND VERIFIED WITH: JASON ROBERTS AT 0410 09/27/2020 DLB     Enterobacter cloacae complex NOT DETECTED NOT DETECTED Final   Escherichia coli DETECTED (A) NOT DETECTED Final    Comment: CRITICAL RESULT CALLED TO, READ BACK BY AND VERIFIED WITH: JASON ROBERTS AT 0410 09/27/2020 DLB    Klebsiella aerogenes NOT DETECTED NOT DETECTED Final   Klebsiella oxytoca NOT DETECTED NOT DETECTED Final   Klebsiella pneumoniae NOT DETECTED NOT DETECTED Final   Proteus species NOT DETECTED NOT DETECTED Final  Salmonella species NOT DETECTED NOT DETECTED Final   Serratia marcescens NOT DETECTED NOT DETECTED Final   Haemophilus influenzae NOT DETECTED NOT DETECTED Final   Neisseria meningitidis NOT DETECTED NOT DETECTED Final   Pseudomonas aeruginosa NOT DETECTED NOT DETECTED Final   Stenotrophomonas maltophilia NOT DETECTED NOT DETECTED Final   Candida albicans NOT DETECTED NOT DETECTED Final   Candida auris NOT DETECTED NOT DETECTED Final   Candida glabrata NOT DETECTED NOT DETECTED Final   Candida krusei NOT DETECTED NOT DETECTED Final   Candida parapsilosis NOT DETECTED NOT DETECTED Final   Candida tropicalis NOT DETECTED NOT DETECTED Final   Cryptococcus neoformans/gattii NOT DETECTED NOT DETECTED Final   CTX-M ESBL NOT DETECTED NOT DETECTED Final   Carbapenem resistance IMP NOT DETECTED NOT DETECTED Final   Carbapenem resistance KPC NOT DETECTED NOT DETECTED Final   Carbapenem resistance NDM NOT DETECTED NOT DETECTED Final   Carbapenem resist OXA 48 LIKE NOT DETECTED NOT DETECTED Final   Carbapenem resistance VIM NOT DETECTED NOT DETECTED Final    Comment: Performed at [facility], [address] [place], [place] [address]  Blood culture (routine x 2)     Status: None (Preliminary result)   Collection Time: 09/26/20  6:00 PM   Specimen: BLOOD  Result Value Ref Range Status   Specimen Description BLOOD RIGHT ANTECUBITAL  Final   Special Requests   Final    BOTTLES DRAWN AEROBIC AND ANAEROBIC Blood Culture results may not be optimal due to an  inadequate volume of blood received in culture bottles   Culture   Final    NO GROWTH 3 DAYS Performed at [facility], [address] [place], [place] [address]    Report Status PENDING  Incomplete  Urine Culture     Status: None   Collection Time: 09/27/20  2:55 PM   Specimen: Urine, Random  Result Value Ref Range Status   Specimen Description   Final    URINE, RANDOM Performed at [facility], [address] [place], [place] [address]    Special Requests   Final    NONE Performed at [facility], [address], [place], [place] [address]    Culture   Final    NO GROWTH Performed at [facility], [address], [place], [place] [address]    Report Status 09/28/2020 FINAL  Final  CULTURE, BLOOD (ROUTINE X 2) w Reflex to ID Panel     Status: None (Preliminary result)   Collection Time: 09/28/20  2:54 PM   Specimen: BLOOD  Result Value Ref Range Status   Specimen Description BLOOD BLOOD RIGHT HAND  Final   Special Requests   Final    BOTTLES DRAWN AEROBIC AND ANAEROBIC Blood Culture results may not be optimal due to an excessive volume of blood received in culture bottles   Culture   Final    NO GROWTH < 24 HOURS Performed at [facility], [address] [place], [place] [address]    Report Status PENDING  Incomplete  CULTURE, BLOOD (ROUTINE X 2) w Reflex to ID Panel     Status: None (Preliminary result)   Collection Time: 09/28/20  3:09 PM   Specimen: BLOOD  Result Value Ref Range Status   Specimen Description BLOOD RIGHT ANTECUBITAL  Final   Special Requests   Final    BOTTLES DRAWN AEROBIC AND ANAEROBIC Blood Culture adequate volume   Culture   Final    NO GROWTH <  69 HOURS Performed at [facility], [address] [place], [place] [address]    Report Status PENDING  Incomplete         Radiology Studies: ECHOCARDIOGRAM LIMITED  Result Date: 09/28/2020    ECHOCARDIOGRAM LIMITED REPORT   Patient  Name:   Antonio Phillips [facility] Date of Exam: 09/28/2020 Medical Rec #:  [id number]          Height:       71.0 in Accession #:    [id number]         Weight:       216.0 lb Date of Birth:  07/10/1945          BSA:          2.179 m Patient Age:    62 years           BP:           116/61 mmHg Patient Gender: M                  HR:           89 bpm. Exam Location:  [facility] Procedure: 2D Echo, Color Doppler and Cardiac Doppler Indications:     CAD- native vessel I25.10  History:         Patient has prior history of Echocardiogram examinations, most                  recent 09/27/2020. Risk Factors:Diabetes. ESRD --dialysis                  patient.  Sonographer:     Sherrie Sport RDCS (AE) Referring Phys:  2040 [address] ROSS Diagnosing Phys: Nelva Bush MD  Sonographer Comments: No apical window. IMPRESSIONS  1. Left ventricular ejection fraction, by estimation, is 45 to 50%. The left ventricle has mildly decreased function. Left ventricular endocardial border not optimally defined to evaluate regional wall motion. There is mild left ventricular hypertrophy.  2. Right ventricular systolic function is moderately reduced. The right ventricular size is normal. Mildly increased right ventricular wall thickness. There is moderately elevated pulmonary artery systolic pressure.  3. The mitral valve is abnormal. Trivial mitral valve regurgitation.  4. The aortic valve is tricuspid. There is mild thickening of the aortic valve.  5. The inferior vena cava is dilated in size with <50% respiratory variability, suggesting right atrial pressure of 15 mmHg. FINDINGS  Left Ventricle: Left ventricular ejection fraction, by estimation, is 45 to 50%. The left ventricle has mildly decreased function. Left ventricular endocardial border not optimally defined to evaluate regional wall motion. There is mild left ventricular  hypertrophy. Right Ventricle: The right ventricular size is normal. Mildly increased right ventricular wall thickness. Right  ventricular systolic function is moderately reduced. There is moderately elevated pulmonary artery systolic pressure. The tricuspid regurgitant velocity is 2.74 m/s, and with an assumed right atrial pressure of 15 mmHg, the estimated right ventricular systolic pressure is 0000000 mmHg. Pericardium: Trivial pericardial effusion is present. Presence of pericardial fat pad. Mitral Valve: The mitral valve is abnormal. Mild to moderate mitral annular calcification. Trivial mitral valve regurgitation. Tricuspid Valve: The tricuspid valve is grossly normal. Aortic Valve: The aortic valve is tricuspid. There is mild thickening of the aortic valve. Pulmonic Valve: The pulmonic valve was not well visualized. Pulmonic valve regurgitation is trivial. No evidence of pulmonic stenosis. Aorta: The aortic root is normal in size and structure. Pulmonary Artery: The pulmonary artery is not well seen.  Venous: The inferior vena cava is dilated in size with less than 50% respiratory variability, suggesting right atrial pressure of 15 mmHg. LEFT VENTRICLE PLAX 2D LVIDd:         4.91 cm LVIDs:         3.53 cm LV PW:         1.46 cm LV IVS:        1.18 cm LVOT diam:     2.10 cm LVOT Area:     3.46 cm  LEFT ATRIUM         Index LA diam:    4.70 cm 2.16 cm/m                        PULMONIC VALVE AORTA                 PV Vmax:        0.60 m/s Ao Root diam: 3.50 cm PV Peak grad:   1.5 mmHg                       RVOT Peak grad: 2 mmHg  TRICUSPID VALVE TR Peak grad:   30.0 mmHg TR Vmax:        274.00 cm/s  SHUNTS Systemic Diam: 2.10 cm Nelva Bush MD Electronically signed by Nelva Bush MD Signature Date/Time: 09/28/2020/5:10:35 PM    Final         Scheduled Meds:  apixaban  5 mg Oral BID   aspirin  81 mg Oral Daily   atorvastatin  40 mg Oral QHS   Chlorhexidine Gluconate Cloth  6 each Topical Q0600   clopidogrel  75 mg Oral Q breakfast   epoetin (EPOGEN/PROCRIT) injection  4,000 Units Intravenous Q T,Th,Sa-HD   finasteride  5  mg Oral Daily   loratadine  10 mg Oral Once per day on Sun Mon Wed Fri   metoprolol tartrate  12.5 mg Oral BID   midodrine  10 mg Oral Q T,Th,Sa-HD   midodrine  5 mg Oral Daily   pantoprazole  40 mg Oral Daily   pregabalin  75 mg Oral QHS   rOPINIRole  1 mg Oral QHS   sevelamer carbonate  1,600 mg Oral 3 times per day on Sun Mon Wed Fri   sevelamer carbonate  800 mg Oral 2 times per day on Tue Thu Sat   vitamin B-12  1,000 mcg Oral Daily   Continuous Infusions:  sodium chloride 250 mL (09/29/20 0545)   cefTRIAXone (ROCEPHIN)  IV 2 g (09/29/20 0549)     LOS: 3 days    Time spent: 4mn  PDomenic Polite MD [facility]   09/29/2020, 3:06 PM

## 2020-09-29 NOTE — Plan of Care (Signed)
  Problem: Education: Goal: Knowledge of General Education information will improve Description: Including pain rating scale, medication(s)/side effects and non-pharmacologic comfort measures Outcome: Completed/Met   Problem: Activity: Goal: Risk for activity intolerance will decrease Outcome: Completed/Met   Problem: Nutrition: Goal: Adequate nutrition will be maintained Outcome: Completed/Met   Problem: Coping: Goal: Level of anxiety will decrease Outcome: Completed/Met   Problem: Elimination: Goal: Will not experience complications related to bowel motility Outcome: Completed/Met Goal: Will not experience complications related to urinary retention Outcome: Completed/Met   Problem: Pain Managment: Goal: General experience of comfort will improve Outcome: Progressing   Problem: Safety: Goal: Ability to remain free from injury will improve Outcome: Completed/Met   Problem: Skin Integrity: Goal: Risk for impaired skin integrity will decrease Outcome: Completed/Met

## 2020-09-29 NOTE — Progress Notes (Signed)
[facility]  ROUNDING NOTE   Subjective:   Antonio Phillips is a 75 y.o. male with a past medical history of hypertension, CAD, rt BKA, and ESRD on dialysis. He presented to the ED from his SNF for chest pain, chills and rigors  Patient had a recent admission last week for chest pain and underwent cardiac cath with stent placement.   Patient currently receives dialysis at [facility], supervised by Dr. Candiss Norse. We have been consulted to manage dialysis during this admission  Patient seen during dialysis   HEMODIALYSIS FLOWSHEET:  Blood Flow Rate (mL/min): 300 mL/min Arterial Pressure (mmHg): -110 mmHg Venous Pressure (mmHg): 90 mmHg Transmembrane Pressure (mmHg): 60 mmHg Ultrafiltration Rate (mL/min): 490 mL/min Dialysate Flow Rate (mL/min): 500 ml/min Conductivity: Machine : 13.5 Conductivity: Machine : 13.5 Dialysis Fluid Bolus: Normal Saline Bolus Amount (mL): 300 mL Dialysate Change: 2K  No complaints at this time   Objective:  Vital signs in last 24 hours:  Temp:  [97.6 F (36.4 C)-98.9 F (37.2 C)] 98 F (36.7 C) (07/26 0915) Pulse Rate:  [68-112] 76 (07/26 0746) Resp:  [11-20] 14 (07/26 1100) BP: (86-116)/(47-79) 103/56 (07/26 1100) SpO2:  [95 %-100 %] 96 % (07/26 0746)  Weight change:  Filed Weights   09/26/20 1230  Weight: 98 kg    Intake/Output: I/O last 3 completed shifts: In: 340 [P.O.:240; IV Piggyback:100] Out: 500 [Urine:500]   Intake/Output this shift:  No intake/output data recorded.  Physical Exam: General: NAD, resting in bed  Head: Normocephalic, atraumatic. Moist oral mucosal membranes  Eyes: Anicteric  Lungs:  Clear to auscultation, normal effort  Heart: Regular rate and rhythm  Abdomen:  Soft, nontender  Extremities:  trace peripheral edema.Rt BKA, Lt AKA  Neurologic: Nonfocal, moving all four extremities  Skin: No lesions  Access: Lt AVF    Basic Metabolic Panel: Recent Labs  Lab 09/26/20 1233  09/26/20 1542 09/27/20 0707 09/28/20 0742 09/29/20 0416  NA 137 135 135 134* 134*  K 4.6 6.3* 3.8 4.4 4.6  CL 102 98 99 98 95*  CO2 26 21* '25 23 27  '$ GLUCOSE 128* 101* 113* 91 119*  BUN 41* 41* 26* 35* 45*  CREATININE 5.66* 6.00* 4.11* 5.31* 6.02*  CALCIUM 8.6* 9.3 8.6* 8.7* 8.6*  PHOS  --  4.9* 3.7  --   --      Liver Function Tests: Recent Labs  Lab 09/26/20 1542 09/27/20 0707  ALBUMIN 3.6 2.7*    No results for input(s): LIPASE, AMYLASE in the last 168 hours. No results for input(s): AMMONIA in the last 168 hours.  CBC: Recent Labs  Lab 09/26/20 1233 09/26/20 1542 09/27/20 0707 09/28/20 0742 09/29/20 0416  WBC 5.7 8.3 5.9 5.9 5.5  HGB 8.6* 10.4* 7.8* 8.3* 8.2*  HCT 27.2* 33.7* 24.5* 26.3* 25.3*  MCV 99.3 99.1 99.6 98.1 97.7  PLT 107* 139* 96* 103* 103*     Cardiac Enzymes: No results for input(s): CKTOTAL, CKMB, CKMBINDEX, TROPONINI in the last 168 hours.  BNP: Invalid input(s): POCBNP  CBG: Recent Labs  Lab 09/26/20 1510 09/27/20 0853  GLUCAP 102* 122*     Microbiology: Results for orders placed or performed during the hospital encounter of 09/26/20  Resp Panel by RT-PCR (Flu A&B, Covid) Nasopharyngeal Swab     Status: None   Collection Time: 09/26/20  4:04 PM   Specimen: Nasopharyngeal Swab; Nasopharyngeal(NP) swabs in vial transport medium  Result Value Ref Range Status   SARS Coronavirus 2 by  RT PCR NEGATIVE NEGATIVE Final    Comment: (NOTE) SARS-CoV-2 target nucleic acids are NOT DETECTED.  The SARS-CoV-2 RNA is generally detectable in upper respiratory specimens during the acute phase of infection. The lowest concentration of SARS-CoV-2 viral copies this assay can detect is 138 copies/mL. A negative result does not preclude SARS-Cov-2 infection and should not be used as the sole basis for treatment or other patient management decisions. A negative result may occur with  improper specimen collection/handling, submission of specimen  other than nasopharyngeal swab, presence of viral mutation(s) within the areas targeted by this assay, and inadequate number of viral copies(<138 copies/mL). A negative result must be combined with clinical observations, patient history, and epidemiological information. The expected result is Negative.  Fact Sheet for Patients:  EntrepreneurPulse.com.au  Fact Sheet for Healthcare Providers:  IncredibleEmployment.be  This test is no t yet approved or cleared by the [place] FDA and  has been authorized for detection and/or diagnosis of SARS-CoV-2 by FDA under an Emergency Use Authorization (EUA). This EUA will remain  in effect (meaning this test can be used) for the duration of the COVID-19 declaration under Section 564(b)(1) of the Act, 21 U.S.C.section 360bbb-3(b)(1), unless the authorization is terminated  or revoked sooner.       Influenza A by PCR NEGATIVE NEGATIVE Final   Influenza B by PCR NEGATIVE NEGATIVE Final    Comment: (NOTE) The Xpert Xpress SARS-CoV-2/FLU/RSV plus assay is intended as an aid in the diagnosis of influenza from Nasopharyngeal swab specimens and should not be used as a sole basis for treatment. Nasal washings and aspirates are unacceptable for Xpert Xpress SARS-CoV-2/FLU/RSV testing.  Fact Sheet for Patients: EntrepreneurPulse.com.au  Fact Sheet for Healthcare Providers: IncredibleEmployment.be  This test is not yet approved or cleared by the [place] FDA and has been authorized for detection and/or diagnosis of SARS-CoV-2 by FDA under an Emergency Use Authorization (EUA). This EUA will remain in effect (meaning this test can be used) for the duration of the COVID-19 declaration under Section 564(b)(1) of the Act, 21 U.S.C. section 360bbb-3(b)(1), unless the authorization is terminated or revoked.  Performed at [facility], [address]  [place], [place] [address]   Blood culture (routine x 2)     Status: Abnormal   Collection Time: 09/26/20  4:06 PM   Specimen: BLOOD  Result Value Ref Range Status   Specimen Description   Final    BLOOD BLOOD RIGHT ARM Performed at [facility], [address] [place], [place] [address]    Special Requests   Final    BOTTLES DRAWN AEROBIC AND ANAEROBIC Blood Culture results may not be optimal due to an inadequate volume of blood received in culture bottles Performed at [facility], [address] [place], [place] [address]    Culture  Setup Time   Final    Organism ID to follow Brooks CRITICAL RESULT CALLED TO, READ BACK BY AND VERIFIED WITH: JASON ROBERTS AT [place] 09/27/2020 DLB Performed at [facility], [address] [place],  [address]    Culture ESCHERICHIA COLI (A)  Final   Report Status 09/29/2020 FINAL  Final   Organism ID, Bacteria ESCHERICHIA COLI  Final      Susceptibility   Escherichia coli - MIC*    AMPICILLIN 16 INTERMEDIATE Intermediate     CEFAZOLIN <=4 SENSITIVE Sensitive     CEFEPIME <=0.12 SENSITIVE Sensitive     CEFTAZIDIME <=1 SENSITIVE  Sensitive     CEFTRIAXONE <=0.25 SENSITIVE Sensitive     CIPROFLOXACIN >=4 RESISTANT Resistant     GENTAMICIN <=1 SENSITIVE Sensitive     IMIPENEM 0.5 SENSITIVE Sensitive     TRIMETH/SULFA >=320 RESISTANT Resistant     AMPICILLIN/SULBACTAM 16 INTERMEDIATE Intermediate     PIP/TAZO 8 SENSITIVE Sensitive     * ESCHERICHIA COLI  Blood Culture ID Panel (Reflexed)     Status: Abnormal   Collection Time: 09/26/20  4:06 PM  Result Value Ref Range Status   Enterococcus faecalis NOT DETECTED NOT DETECTED Final   Enterococcus Faecium NOT DETECTED NOT DETECTED Final   Listeria monocytogenes NOT DETECTED NOT DETECTED Final   Staphylococcus species NOT DETECTED NOT DETECTED Final   Staphylococcus aureus (BCID) NOT DETECTED NOT DETECTED Final   Staphylococcus  epidermidis NOT DETECTED NOT DETECTED Final   Staphylococcus lugdunensis NOT DETECTED NOT DETECTED Final   Streptococcus species NOT DETECTED NOT DETECTED Final   Streptococcus agalactiae NOT DETECTED NOT DETECTED Final   Streptococcus pneumoniae NOT DETECTED NOT DETECTED Final   Streptococcus pyogenes NOT DETECTED NOT DETECTED Final   A.calcoaceticus-baumannii NOT DETECTED NOT DETECTED Final   Bacteroides fragilis NOT DETECTED NOT DETECTED Final   Enterobacterales DETECTED (A) NOT DETECTED Final    Comment: Enterobacterales represent a large order of gram negative bacteria, not a single organism. CRITICAL RESULT CALLED TO, READ BACK BY AND VERIFIED WITH: JASON ROBERTS AT 0410 09/27/2020 DLB    Enterobacter cloacae complex NOT DETECTED NOT DETECTED Final   Escherichia coli DETECTED (A) NOT DETECTED Final    Comment: CRITICAL RESULT CALLED TO, READ BACK BY AND VERIFIED WITH: JASON ROBERTS AT 0410 09/27/2020 DLB    Klebsiella aerogenes NOT DETECTED NOT DETECTED Final   Klebsiella oxytoca NOT DETECTED NOT DETECTED Final   Klebsiella pneumoniae NOT DETECTED NOT DETECTED Final   Proteus species NOT DETECTED NOT DETECTED Final   Salmonella species NOT DETECTED NOT DETECTED Final   Serratia marcescens NOT DETECTED NOT DETECTED Final   Haemophilus influenzae NOT DETECTED NOT DETECTED Final   Neisseria meningitidis NOT DETECTED NOT DETECTED Final   Pseudomonas aeruginosa NOT DETECTED NOT DETECTED Final   Stenotrophomonas maltophilia NOT DETECTED NOT DETECTED Final   Candida albicans NOT DETECTED NOT DETECTED Final   Candida auris NOT DETECTED NOT DETECTED Final   Candida glabrata NOT DETECTED NOT DETECTED Final   Candida krusei NOT DETECTED NOT DETECTED Final   Candida parapsilosis NOT DETECTED NOT DETECTED Final   Candida tropicalis NOT DETECTED NOT DETECTED Final   Cryptococcus neoformans/gattii NOT DETECTED NOT DETECTED Final   CTX-M ESBL NOT DETECTED NOT DETECTED Final   Carbapenem  resistance IMP NOT DETECTED NOT DETECTED Final   Carbapenem resistance KPC NOT DETECTED NOT DETECTED Final   Carbapenem resistance NDM NOT DETECTED NOT DETECTED Final   Carbapenem resist OXA 48 LIKE NOT DETECTED NOT DETECTED Final   Carbapenem resistance VIM NOT DETECTED NOT DETECTED Final    Comment: Performed at [facility], [address] [place], [place] [address]  Blood culture (routine x 2)     Status: None (Preliminary result)   Collection Time: 09/26/20  6:00 PM   Specimen: BLOOD  Result Value Ref Range Status   Specimen Description BLOOD RIGHT ANTECUBITAL  Final   Special Requests   Final    BOTTLES DRAWN AEROBIC AND ANAEROBIC Blood Culture results may not be optimal due to an inadequate volume of blood received in culture bottles   Culture  Final    NO GROWTH 3 DAYS Performed at [facility], [address] [place], [place] [address]    Report Status PENDING  Incomplete  Urine Culture     Status: None   Collection Time: 09/27/20  2:55 PM   Specimen: Urine, Random  Result Value Ref Range Status   Specimen Description   Final    URINE, RANDOM Performed at [facility], [address], [place], [place] [address]    Special Requests   Final    NONE Performed at [facility], [address], [place], [place] [address]    Culture   Final    NO GROWTH Performed at [facility], [place] [address], [place], [place] [address]    Report Status 09/28/2020 FINAL  Final  CULTURE, BLOOD (ROUTINE X 2) w Reflex to ID Panel     Status: None (Preliminary result)   Collection Time: 09/28/20  2:54 PM   Specimen: BLOOD  Result Value Ref Range Status   Specimen Description BLOOD BLOOD RIGHT HAND  Final   Special Requests   Final    BOTTLES DRAWN AEROBIC AND ANAEROBIC Blood Culture results may not be optimal due to an excessive volume of blood received in culture bottles   Culture   Final    NO GROWTH < 24 HOURS Performed at [facility], [address] [place], [place] [address]    Report Status PENDING  Incomplete  CULTURE, BLOOD (ROUTINE X 2) w Reflex to ID Panel     Status: None (Preliminary result)   Collection Time: 09/28/20  3:09 PM   Specimen: BLOOD  Result Value Ref Range Status   Specimen Description BLOOD RIGHT ANTECUBITAL  Final   Special Requests   Final    BOTTLES DRAWN AEROBIC AND ANAEROBIC Blood Culture adequate volume   Culture   Final    NO GROWTH < 24 HOURS Performed at [facility], [address], [place], [place] [address]    Report Status PENDING  Incomplete    Coagulation Studies: Recent Labs    09/26/20 1233  LABPROT 20.1*  INR 1.7*     Urinalysis: Recent Labs    09/27/20 1455  COLORURINE YELLOW*  LABSPEC 1.009  PHURINE 9.0*  GLUCOSEU 150*  HGBUR SMALL*  BILIRUBINUR NEGATIVE  KETONESUR NEGATIVE  PROTEINUR 100*  NITRITE NEGATIVE  LEUKOCYTESUR LARGE*       Imaging: ECHOCARDIOGRAM LIMITED  Result Date: 09/28/2020    ECHOCARDIOGRAM LIMITED REPORT   Patient Name:   TYMARION MORRE [facility] Date of Exam: 09/28/2020 Medical Rec #:  [id number]          Height:       71.0 in Accession #:    [id number]         Weight:       216.0 lb Date of Birth:  07/20/1945          BSA:          2.179 m Patient Age:    60 years           BP:           116/61 mmHg Patient Gender: M                  HR:           89 bpm. Exam Location:  [facility] Procedure: 2D Echo, Color Doppler and Cardiac Doppler Indications:     CAD- native vessel I25.10  History:         Patient has prior history of Echocardiogram examinations, most                  recent 09/27/2020. Risk Factors:Diabetes. ESRD --dialysis                  patient.  Sonographer:     Sherrie Sport RDCS (AE) Referring Phys:  [id number] Carmin [place] ROSS Diagnosing Phys: Nelva Bush MD  Sonographer Comments: No apical window. IMPRESSIONS  1. Left ventricular ejection fraction, by estimation, is 45 to 50%. The left ventricle has mildly decreased  function. Left ventricular endocardial border not optimally defined to evaluate regional wall motion. There is mild left ventricular hypertrophy.  2. Right ventricular systolic function is moderately reduced. The right ventricular size is normal. Mildly increased right ventricular wall thickness. There is moderately elevated pulmonary artery systolic pressure.  3. The mitral valve is abnormal. Trivial mitral valve regurgitation.  4. The aortic valve is tricuspid. There is mild thickening of the aortic valve.  5. The inferior vena cava is dilated in size with <50% respiratory variability, suggesting right atrial pressure of 15 mmHg. FINDINGS  Left Ventricle: Left ventricular ejection fraction, by estimation, is 45 to 50%. The left ventricle has mildly decreased function. Left ventricular endocardial border not optimally defined to evaluate regional wall motion. There is mild left ventricular  hypertrophy. Right Ventricle: The right ventricular size is normal. Mildly increased right ventricular wall thickness. Right ventricular systolic function is moderately reduced. There is moderately elevated pulmonary artery systolic pressure. The tricuspid regurgitant velocity is 2.74 m/s, and with an assumed right atrial pressure of 15 mmHg, the estimated right ventricular systolic pressure is 0000000 mmHg. Pericardium: Trivial pericardial effusion is present. Presence of pericardial fat pad. Mitral Valve: The mitral valve is abnormal. Mild to moderate mitral annular calcification. Trivial mitral valve regurgitation. Tricuspid Valve: The tricuspid valve is grossly normal. Aortic Valve: The aortic valve is tricuspid. There is mild thickening of the aortic valve. Pulmonic Valve: The pulmonic valve was not well visualized. Pulmonic valve regurgitation is trivial. No evidence of pulmonic stenosis. Aorta: The aortic root is normal in size and structure. Pulmonary Artery: The pulmonary artery is not well seen. Venous: The inferior vena  cava is dilated in size with less than 50% respiratory variability, suggesting right atrial pressure of 15 mmHg. LEFT VENTRICLE PLAX 2D LVIDd:         4.91 cm LVIDs:         3.53 cm LV PW:         1.46 cm LV IVS:        1.18 cm LVOT diam:     2.10 cm LVOT Area:     3.46 cm  LEFT ATRIUM         Index LA diam:    4.70 cm 2.16 cm/m                        PULMONIC VALVE AORTA                 PV Vmax:        0.60 m/s Ao Root diam: 3.50 cm PV Peak grad:   1.5 mmHg                       RVOT Peak grad: 2 mmHg  TRICUSPID VALVE TR Peak grad:   30.0 mmHg TR Vmax:  274.00 cm/s  SHUNTS Systemic Diam: 2.10 cm Nelva Bush MD Electronically signed by Nelva Bush MD Signature Date/Time: 09/28/2020/5:10:35 PM    Final    ECHOCARDIOGRAM LIMITED  Result Date: 09/27/2020    ECHOCARDIOGRAM LIMITED REPORT   Patient Name:   BAYLEE WHITEHALL [facility] Date of Exam: 09/27/2020 Medical Rec #:  [id number]          Height:       71.0 in Accession #:    [id number]         Weight:       216.0 lb Date of Birth:  1945/05/03          BSA:          2.179 m Patient Age:    23 years           BP:           105/58 mmHg Patient Gender: M                  HR:           76 bpm. Exam Location:  [facility] Procedure: 2D Echo, Cardiac Doppler and Color Doppler Indications:     Chest Pain R07.9  History:         Patient has prior history of Echocardiogram examinations. Risk                  Factors:Hypertension and Diabetes. PAD.  Sonographer:     Alyse Low Roar Referring Phys:  [id number] [place] Diagnosing Phys: Dorris Carnes MD IMPRESSIONS  1. Poor acoustic windows Endocardium is not well seen I would recomm limited echo with Definity to help define LVEF and regional wall motion. . There is mild left ventricular hypertrophy. Left ventricular diastolic parameters are indeterminate.  2. RV is not seen well enough to evaluate function. . The right ventricular size is mildly enlarged.  3. Left atrial size was severely dilated.  4. Right atrial size was  severely dilated.  5. Mild mitral valve regurgitation.  6. Tricuspid valve regurgitation is mild to moderate.  7. The aortic valve is tricuspid. Aortic valve regurgitation is not visualized. Mild aortic valve sclerosis is present, with no evidence of aortic valve stenosis. FINDINGS  Left Ventricle: Poor acoustic windows Endocardium is not well seen I would recomm limited echo with Definity to help define LVEF and regional wall motion. The left ventricular internal cavity size was normal in size. There is mild left ventricular hypertrophy. Left ventricular diastolic parameters are indeterminate. Right Ventricle: RV is not seen well enough to evaluate function. The right ventricular size is mildly enlarged. Right vetricular wall thickness was not assessed. Left Atrium: Left atrial size was severely dilated. Right Atrium: Right atrial size was severely dilated. Pericardium: There is no evidence of pericardial effusion. Mitral Valve: Mild mitral annular calcification. Mild mitral valve regurgitation. Tricuspid Valve: The tricuspid valve is normal in structure. Tricuspid valve regurgitation is mild to moderate. Aortic Valve: The aortic valve is tricuspid. Aortic valve regurgitation is not visualized. Mild aortic valve sclerosis is present, with no evidence of aortic valve stenosis. Aortic valve peak gradient measures 11.8 mmHg. Pulmonic Valve: The pulmonic valve was normal in structure. Pulmonic valve regurgitation is mild to moderate. Aorta: The aortic root is normal in size and structure. IAS/Shunts: No atrial level shunt detected by color flow Doppler. LEFT VENTRICLE PLAX 2D LVIDd:         4.60 cm  Diastology LVIDs:  3.40 cm  LV e' medial:    5.66 cm/s LV PW:         1.10 cm  LV E/e' medial:  20.1 LV IVS:        1.20 cm  LV e' lateral:   8.70 cm/s LVOT diam:     1.70 cm  LV E/e' lateral: 13.1 LVOT Area:     2.27 cm  RIGHT VENTRICLE RV Mid diam:    3.70 cm RV S prime:     10.10 cm/s TAPSE (M-mode): 1.6 cm LEFT  ATRIUM              Index       RIGHT ATRIUM           Index LA diam:        4.50 cm  2.07 cm/m  RA Area:     33.50 cm LA Vol (A2C):   102.0 ml 46.81 ml/m RA Volume:   128.00 ml 58.75 ml/m LA Vol (A4C):   113.0 ml 51.86 ml/m LA Biplane Vol: 112.0 ml 51.40 ml/m  AORTIC VALVE                PULMONIC VALVE AV Area (Vmax): 1.25 cm    PV Vmax:        0.90 m/s AV Vmax:        172.00 cm/s PV Peak grad:   3.2 mmHg AV Peak Grad:   11.8 mmHg   RVOT Peak grad: 1 mmHg LVOT Vmax:      95.10 cm/s  AORTA Ao Root diam: 3.00 cm MITRAL VALVE                TRICUSPID VALVE MV Area (PHT): 4.17 cm     TR Peak grad:   37.7 mmHg MV Decel Time: 182 msec     TR Vmax:        307.00 cm/s MV E velocity: 114.00 cm/s                             SHUNTS                             Systemic Diam: 1.70 cm Dorris Carnes MD Electronically signed by Dorris Carnes MD Signature Date/Time: 09/27/2020/2:47:51 PM    Final      Medications:    sodium chloride 100 mL (09/26/20 2229)   sodium chloride     sodium chloride 250 mL (09/29/20 0545)   cefTRIAXone (ROCEPHIN)  IV 2 g (09/29/20 0549)    apixaban  5 mg Oral BID   aspirin  81 mg Oral Daily   atorvastatin  40 mg Oral QHS   Chlorhexidine Gluconate Cloth  6 each Topical Q0600   clopidogrel  75 mg Oral Q breakfast   finasteride  5 mg Oral Daily   loratadine  10 mg Oral Once per day on Sun Mon Wed Fri   metoprolol tartrate  12.5 mg Oral BID   midodrine  10 mg Oral Q T,Th,Sa-HD   midodrine  5 mg Oral Daily   pantoprazole  40 mg Oral Daily   pregabalin  75 mg Oral QHS   rOPINIRole  1 mg Oral QHS   sevelamer carbonate  1,600 mg Oral 3 times per day on Sun Mon Wed Fri   sevelamer carbonate  800 mg Oral 2 times per day on Tue Thu Sat  vitamin B-12  1,000 mcg Oral Daily   sodium chloride, sodium chloride, sodium chloride, acetaminophen **OR** acetaminophen, alteplase, diclofenac Sodium, heparin, HYDROcodone-acetaminophen, lidocaine (PF), lidocaine-prilocaine, morphine injection,  ondansetron **OR** ondansetron (ZOFRAN) IV, pentafluoroprop-tetrafluoroeth, polyethylene glycol, polyvinyl alcohol  Assessment/ Plan:  Antonio Phillips is a 75 y.o.  male with a past medical history of hypertension, CAD, rt BKA, and ESRD on dialysis. He presented to the ED from his SNF for chest pain and shortness of breath.   [facility]/TTS/Lt AVF  1. End stage renal disease on dialysis Will maintain outpatient schedule during this admission Receiving dialysis today with low BFR due to ongoing cardiac concerns Next treatment scheduled for Thursday  2. Anemia of chronic kidney disease   Lab Results  Component Value Date   HGB 8.2 (L) 09/29/2020   Hgb below target Low dose EPO ordered with treatments   3. Secondary Hyperparathyroidism:   Lab Results  Component Value Date   CALCIUM 8.6 (L) 09/29/2020   PHOS 3.7 09/27/2020  Phosphorus of 3.7, within range Continue Renvela as ordered      LOS: 3 Vear Staton 7/26/202211:42 AM

## 2020-09-29 NOTE — Progress Notes (Signed)
Pt. tolerated tx w/o incident noted oozing from arterial site during last 15 mins of tx. Midodrine administered for B/P support. Fld. removal 1 liter. No CP, VSS. Monitor for post tx bleeding at cannulation site of AVF.

## 2020-09-29 NOTE — Progress Notes (Signed)
Progress Note  Patient Name: Antonio Phillips Date of Encounter: 09/29/2020  Primary Cardiologist: Rockey Situ  Subjective   No chest pain, dyspnea, palpitations, dizziness, presyncope, or syncope. Tolerated HD well today.   Inpatient Medications    Scheduled Meds:  apixaban  5 mg Oral BID   aspirin  81 mg Oral Daily   atorvastatin  40 mg Oral QHS   Chlorhexidine Gluconate Cloth  6 each Topical Q0600   clopidogrel  75 mg Oral Q breakfast   epoetin (EPOGEN/PROCRIT) injection  4,000 Units Intravenous Q T,Th,Sa-HD   finasteride  5 mg Oral Daily   loratadine  10 mg Oral Once per day on Sun Mon Wed Fri   metoprolol tartrate  12.5 mg Oral BID   midodrine  10 mg Oral Q T,Th,Sa-HD   midodrine  5 mg Oral Daily   pantoprazole  40 mg Oral Daily   pregabalin  75 mg Oral QHS   rOPINIRole  1 mg Oral QHS   sevelamer carbonate  1,600 mg Oral 3 times per day on Sun Mon Wed Fri   sevelamer carbonate  800 mg Oral 2 times per day on Tue Thu Sat   vitamin B-12  1,000 mcg Oral Daily   Continuous Infusions:  sodium chloride 250 mL (09/29/20 0545)   cefTRIAXone (ROCEPHIN)  IV 2 g (09/29/20 0549)   PRN Meds: sodium chloride, acetaminophen **OR** acetaminophen, diclofenac Sodium, HYDROcodone-acetaminophen, morphine injection, ondansetron **OR** ondansetron (ZOFRAN) IV, polyethylene glycol, polyvinyl alcohol   Vital Signs    Vitals:   09/29/20 1200 09/29/20 1215 09/29/20 1230 09/29/20 1315  BP: (!) 113/58 (!) 114/100 117/62 (!) 130/55  Pulse:    77  Resp: '14 17 12 16  '$ Temp:    97.8 F (36.6 C)  TempSrc:      SpO2:    100%  Weight:        Intake/Output Summary (Last 24 hours) at 09/29/2020 1456 Last data filed at 09/29/2020 1230 Gross per 24 hour  Intake 240 ml  Output 1504 ml  Net -1264 ml    Filed Weights   09/26/20 1230  Weight: 98 kg    Telemetry    Afib, 80s bpm - Personally Reviewed  ECG    No new tracings - Personally Reviewed  Physical Exam   GEN: No acute  distress.   Neck: No JVD. Cardiac: IRIR, II/VI systolic murmur LSB, no rubs, or gallops.  Respiratory: Clear to auscultation bilaterally.  GI: Soft, nontender, non-distended.   MS: No edema; status post bilateral lower extremity amputations.  Neuro:  Alert and oriented x 3; Nonfocal.  Psych: Normal affect.  Labs    Chemistry Recent Labs  Lab 09/26/20 1542 09/27/20 0707 09/28/20 0742 09/29/20 0416  NA 135 135 134* 134*  K 6.3* 3.8 4.4 4.6  CL 98 99 98 95*  CO2 21* '25 23 27  '$ GLUCOSE 101* 113* 91 119*  BUN 41* 26* 35* 45*  CREATININE 6.00* 4.11* 5.31* 6.02*  CALCIUM 9.3 8.6* 8.7* 8.6*  ALBUMIN 3.6 2.7*  --   --   GFRNONAA 9* 14* 11* 9*  ANIONGAP 16* '11 13 12      '$ Hematology Recent Labs  Lab 09/27/20 0707 09/28/20 0742 09/29/20 0416  WBC 5.9 5.9 5.5  RBC 2.46*  2.52* 2.68* 2.59*  HGB 7.8* 8.3* 8.2*  HCT 24.5* 26.3* 25.3*  MCV 99.6 98.1 97.7  MCH 31.7 31.0 31.7  MCHC 31.8 31.6 32.4  RDW 17.6* 17.6* 17.6*  PLT  96* 103* 103*     Cardiac EnzymesNo results for input(s): TROPONINI in the last 168 hours. No results for input(s): TROPIPOC in the last 168 hours.   BNPNo results for input(s): BNP, PROBNP in the last 168 hours.   DDimer No results for input(s): DDIMER in the last 168 hours.   Radiology    DG Chest 2 View  Result Date: 09/26/2020 IMPRESSION: Cardiomegaly with persistent mild interstitial prominence likely reflecting a degree of chronic underlying pulmonary edema. Electronically Signed   By: Valentino Saxon MD   On: 09/26/2020 13:12   [place] LT LOWER EXTREM LTD SOFT TISSUE NON VASCULAR  Result Date: 09/26/2020 IMPRESSION: Targeted ultrasound evaluation of the indicated area of concern reveals mild induration and subcutaneous edema without subjacent collection, abscess or mass. Electronically Signed   By: Lovena Le M.D.   On: 09/26/2020 20:08   Cardiac Studies   Limited 2D echo with Definity 09/28/2020: 1. Left ventricular ejection fraction, by  estimation, is 45 to 50%. The  left ventricle has mildly decreased function. Left ventricular endocardial  border not optimally defined to evaluate regional wall motion. There is  mild left ventricular hypertrophy.   2. Right ventricular systolic function is moderately reduced. The right  ventricular size is normal. Mildly increased right ventricular wall  thickness. There is moderately elevated pulmonary artery systolic  pressure.   3. The mitral valve is abnormal. Trivial mitral valve regurgitation.   4. The aortic valve is tricuspid. There is mild thickening of the aortic  valve.   5. The inferior vena cava is dilated in size with <50% respiratory  variability, suggesting right atrial pressure of 15 mmHg.  __________  2D echo 09/27/2020:  1. Poor acoustic windows Endocardium is not well seen I would recomm  limited echo with Definity to help define LVEF and regional wall motion. .  There is mild left ventricular hypertrophy. Left ventricular diastolic  parameters are indeterminate.   2. RV is not seen well enough to evaluate function. . The right  ventricular size is mildly enlarged.   3. Left atrial size was severely dilated.   4. Right atrial size was severely dilated.   5. Mild mitral valve regurgitation.   6. Tricuspid valve regurgitation is mild to moderate.   7. The aortic valve is tricuspid. Aortic valve regurgitation is not  visualized. Mild aortic valve sclerosis is present, with no evidence of  aortic valve stenosis.  __________  LHC 09/22/2020:   Ost LAD to Prox LAD lesion is 50% stenosed.   2nd Mrg lesion is 99% stenosed.   A drug-eluting stent was successfully placed using a STENT RESOLUTE ONYX 2.5X15.   Post intervention, there is a 30% residual stenosis.   1.  Severe single-vessel coronary artery disease with 99% subtotal stenosis of the second obtuse marginal branch, treated with a 2.5 x 15 mm resolute Onyx DES.  30% residual stenosis at the case completion due  to resistant/calcified lesion type even with high-pressure noncompliant balloon angioplasty. 2.  Mild to moderate nonobstructive proximal LAD stenosis 3.  Mild nonobstructive RCA stenosis  Recommendations: Aggressive medical therapy, as long as no bleeding complications arise, would resume apixaban tomorrow, aspirin 81 mg daily x30 days, and clopidogrel 75 mg daily x minimum 6 months. ___________  2D echo 09/21/2020: 1. Left ventricular ejection fraction, by estimation, is 60 to 65%. The  left ventricle has normal function. The left ventricle has no regional  wall motion abnormalities. Left ventricular diastolic parameters are  indeterminate.   2. Right ventricular systolic function is normal. The right ventricular  size is normal. Tricuspid regurgitation signal is inadequate for assessing  PA pressure.   3. Left atrial size was moderately dilated.   4. Right atrial size was moderately dilated.   5. Tricuspid valve regurgitation is mild to moderate.   6. The mitral valve is normal in structure. Mild mitral valve  regurgitation.   7. Rhythym is atrial fibrillation    Patient Profile     75 y.o. 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  Assessment & Plan    1. CAD with recent NSTEMI earlier this month s/p PCI to OM2 with chest pain: -Chest pain free since 7/23 -High sensitivity troponin mildly elevated and flat trending at 144, likely supply demand ischemia in the setting of ESRD, anemia, and E coli bacteremia  -Initial echo this admission with poor acoustic windows, repeat limited echo still with poor windows with EF ~ 50% -Given lack of further symptoms, continue current medical/antianginal therapy with  metoprolol  -Unable to add Imdur or titrate metoprolol due to relative hypotension and unable to add Ranexa given ESRD -At time of his LHC on 09/22/2020, recommendation was for concurrent ASA 81 mg for 1 month and Plavix 75 mg for 6 months, with continuation of Eliquis, at time of discharge, consider stopping ASA and continuing him on Eliquis and Plavix in an effort to minimize bleeding risk given his comorbid conditions   2. Permament Afib: -Ventricular rates well controlled  -Metoprolol  -CHADS2VASc at least 4 (age x 2, DM, vascular disease) -PTA Eliquis  3. HLD: -LDL 22 this month -Lipitor  4. History of hypotension: -PTA midodrine  5. ESRD: -HD  6. Anemia of chronic disease: -Low, though stable, currently 8.2 -Monitor on triple therapy  -PPI -EPO with HD  7. E coli bacteremia: -Per IM   For questions or updates, please contact [place] Please consult [email or web address] for contact info under Cardiology/STEMI.    Signed, Christell Faith, PA-C [place] Pager: [phone] 09/29/2020, 2:56 PM

## 2020-09-30 DIAGNOSIS — E1122 Type 2 diabetes mellitus with diabetic chronic kidney disease: Secondary | ICD-10-CM | POA: Diagnosis present

## 2020-09-30 DIAGNOSIS — I9589 Other hypotension: Secondary | ICD-10-CM | POA: Diagnosis present

## 2020-09-30 DIAGNOSIS — Z955 Presence of coronary angioplasty implant and graft: Secondary | ICD-10-CM | POA: Diagnosis not present

## 2020-09-30 DIAGNOSIS — N2581 Secondary hyperparathyroidism of renal origin: Secondary | ICD-10-CM | POA: Diagnosis present

## 2020-09-30 DIAGNOSIS — L97129 Non-pressure chronic ulcer of left thigh with unspecified severity: Secondary | ICD-10-CM | POA: Diagnosis present

## 2020-09-30 DIAGNOSIS — L89223 Pressure ulcer of left hip, stage 3: Secondary | ICD-10-CM | POA: Diagnosis present

## 2020-09-30 DIAGNOSIS — R52 Pain, unspecified: Secondary | ICD-10-CM | POA: Diagnosis not present

## 2020-09-30 DIAGNOSIS — I132 Hypertensive heart and chronic kidney disease with heart failure and with stage 5 chronic kidney disease, or end stage renal disease: Secondary | ICD-10-CM | POA: Diagnosis present

## 2020-09-30 DIAGNOSIS — G546 Phantom limb syndrome with pain: Secondary | ICD-10-CM | POA: Diagnosis not present

## 2020-09-30 DIAGNOSIS — I255 Ischemic cardiomyopathy: Secondary | ICD-10-CM | POA: Diagnosis not present

## 2020-09-30 DIAGNOSIS — J9811 Atelectasis: Secondary | ICD-10-CM | POA: Diagnosis present

## 2020-09-30 DIAGNOSIS — S71001A Unspecified open wound, right hip, initial encounter: Secondary | ICD-10-CM | POA: Diagnosis not present

## 2020-09-30 DIAGNOSIS — Z96641 Presence of right artificial hip joint: Secondary | ICD-10-CM | POA: Diagnosis not present

## 2020-09-30 DIAGNOSIS — Z66 Do not resuscitate: Secondary | ICD-10-CM | POA: Diagnosis present

## 2020-09-30 DIAGNOSIS — I25810 Atherosclerosis of coronary artery bypass graft(s) without angina pectoris: Secondary | ICD-10-CM | POA: Diagnosis not present

## 2020-09-30 DIAGNOSIS — Z760 Encounter for issue of repeat prescription: Secondary | ICD-10-CM | POA: Diagnosis not present

## 2020-09-30 DIAGNOSIS — E1151 Type 2 diabetes mellitus with diabetic peripheral angiopathy without gangrene: Secondary | ICD-10-CM | POA: Diagnosis present

## 2020-09-30 DIAGNOSIS — Z6830 Body mass index (BMI) 30.0-30.9, adult: Secondary | ICD-10-CM | POA: Diagnosis not present

## 2020-09-30 DIAGNOSIS — M6281 Muscle weakness (generalized): Secondary | ICD-10-CM | POA: Diagnosis not present

## 2020-09-30 DIAGNOSIS — R252 Cramp and spasm: Secondary | ICD-10-CM | POA: Diagnosis not present

## 2020-09-30 DIAGNOSIS — Z89612 Acquired absence of left leg above knee: Secondary | ICD-10-CM | POA: Diagnosis not present

## 2020-09-30 DIAGNOSIS — R0902 Hypoxemia: Secondary | ICD-10-CM | POA: Diagnosis not present

## 2020-09-30 DIAGNOSIS — R079 Chest pain, unspecified: Secondary | ICD-10-CM | POA: Diagnosis not present

## 2020-09-30 DIAGNOSIS — Z992 Dependence on renal dialysis: Secondary | ICD-10-CM | POA: Diagnosis not present

## 2020-09-30 DIAGNOSIS — I208 Other forms of angina pectoris: Secondary | ICD-10-CM | POA: Diagnosis not present

## 2020-09-30 DIAGNOSIS — D649 Anemia, unspecified: Secondary | ICD-10-CM | POA: Diagnosis not present

## 2020-09-30 DIAGNOSIS — R1010 Upper abdominal pain, unspecified: Secondary | ICD-10-CM | POA: Diagnosis not present

## 2020-09-30 DIAGNOSIS — R571 Hypovolemic shock: Secondary | ICD-10-CM | POA: Diagnosis present

## 2020-09-30 DIAGNOSIS — R0789 Other chest pain: Secondary | ICD-10-CM | POA: Diagnosis not present

## 2020-09-30 DIAGNOSIS — R2681 Unsteadiness on feet: Secondary | ICD-10-CM | POA: Diagnosis not present

## 2020-09-30 DIAGNOSIS — D631 Anemia in chronic kidney disease: Secondary | ICD-10-CM | POA: Diagnosis present

## 2020-09-30 DIAGNOSIS — T148XXA Other injury of unspecified body region, initial encounter: Secondary | ICD-10-CM | POA: Diagnosis not present

## 2020-09-30 DIAGNOSIS — L89152 Pressure ulcer of sacral region, stage 2: Secondary | ICD-10-CM | POA: Diagnosis present

## 2020-09-30 DIAGNOSIS — I248 Other forms of acute ischemic heart disease: Secondary | ICD-10-CM | POA: Diagnosis not present

## 2020-09-30 DIAGNOSIS — R1013 Epigastric pain: Secondary | ICD-10-CM | POA: Diagnosis not present

## 2020-09-30 DIAGNOSIS — N186 End stage renal disease: Secondary | ICD-10-CM | POA: Diagnosis not present

## 2020-09-30 DIAGNOSIS — R0781 Pleurodynia: Secondary | ICD-10-CM | POA: Diagnosis not present

## 2020-09-30 DIAGNOSIS — Z91199 Patient's noncompliance with other medical treatment and regimen due to unspecified reason: Secondary | ICD-10-CM | POA: Diagnosis not present

## 2020-09-30 DIAGNOSIS — I4821 Permanent atrial fibrillation: Secondary | ICD-10-CM | POA: Diagnosis present

## 2020-09-30 DIAGNOSIS — R54 Age-related physical debility: Secondary | ICD-10-CM | POA: Diagnosis not present

## 2020-09-30 DIAGNOSIS — Z7189 Other specified counseling: Secondary | ICD-10-CM | POA: Diagnosis not present

## 2020-09-30 DIAGNOSIS — M25551 Pain in right hip: Secondary | ICD-10-CM | POA: Diagnosis not present

## 2020-09-30 DIAGNOSIS — R7881 Bacteremia: Secondary | ICD-10-CM | POA: Diagnosis not present

## 2020-09-30 DIAGNOSIS — I4891 Unspecified atrial fibrillation: Secondary | ICD-10-CM | POA: Diagnosis not present

## 2020-09-30 DIAGNOSIS — R1084 Generalized abdominal pain: Secondary | ICD-10-CM | POA: Diagnosis not present

## 2020-09-30 DIAGNOSIS — I120 Hypertensive chronic kidney disease with stage 5 chronic kidney disease or end stage renal disease: Secondary | ICD-10-CM | POA: Diagnosis not present

## 2020-09-30 DIAGNOSIS — I214 Non-ST elevation (NSTEMI) myocardial infarction: Secondary | ICD-10-CM | POA: Diagnosis not present

## 2020-09-30 DIAGNOSIS — D696 Thrombocytopenia, unspecified: Secondary | ICD-10-CM | POA: Diagnosis not present

## 2020-09-30 DIAGNOSIS — B9629 Other Escherichia coli [E. coli] as the cause of diseases classified elsewhere: Secondary | ICD-10-CM | POA: Diagnosis not present

## 2020-09-30 DIAGNOSIS — I482 Chronic atrial fibrillation, unspecified: Secondary | ICD-10-CM | POA: Diagnosis not present

## 2020-09-30 DIAGNOSIS — K5730 Diverticulosis of large intestine without perforation or abscess without bleeding: Secondary | ICD-10-CM | POA: Diagnosis not present

## 2020-09-30 DIAGNOSIS — I959 Hypotension, unspecified: Secondary | ICD-10-CM

## 2020-09-30 DIAGNOSIS — K8070 Calculus of gallbladder and bile duct without cholecystitis without obstruction: Secondary | ICD-10-CM | POA: Diagnosis present

## 2020-09-30 DIAGNOSIS — R1011 Right upper quadrant pain: Secondary | ICD-10-CM | POA: Diagnosis not present

## 2020-09-30 DIAGNOSIS — A4151 Sepsis due to Escherichia coli [E. coli]: Secondary | ICD-10-CM | POA: Diagnosis not present

## 2020-09-30 DIAGNOSIS — E785 Hyperlipidemia, unspecified: Secondary | ICD-10-CM | POA: Diagnosis not present

## 2020-09-30 DIAGNOSIS — Z5189 Encounter for other specified aftercare: Secondary | ICD-10-CM | POA: Diagnosis not present

## 2020-09-30 DIAGNOSIS — J449 Chronic obstructive pulmonary disease, unspecified: Secondary | ICD-10-CM | POA: Diagnosis not present

## 2020-09-30 DIAGNOSIS — Z48812 Encounter for surgical aftercare following surgery on the circulatory system: Secondary | ICD-10-CM | POA: Diagnosis not present

## 2020-09-30 DIAGNOSIS — I25118 Atherosclerotic heart disease of native coronary artery with other forms of angina pectoris: Secondary | ICD-10-CM | POA: Diagnosis not present

## 2020-09-30 DIAGNOSIS — Z01810 Encounter for preprocedural cardiovascular examination: Secondary | ICD-10-CM | POA: Diagnosis not present

## 2020-09-30 DIAGNOSIS — K921 Melena: Secondary | ICD-10-CM | POA: Diagnosis present

## 2020-09-30 DIAGNOSIS — I5022 Chronic systolic (congestive) heart failure: Secondary | ICD-10-CM | POA: Diagnosis present

## 2020-09-30 DIAGNOSIS — Z7901 Long term (current) use of anticoagulants: Secondary | ICD-10-CM | POA: Diagnosis not present

## 2020-09-30 DIAGNOSIS — E0840 Diabetes mellitus due to underlying condition with diabetic neuropathy, unspecified: Secondary | ICD-10-CM | POA: Diagnosis not present

## 2020-09-30 DIAGNOSIS — Z515 Encounter for palliative care: Secondary | ICD-10-CM | POA: Diagnosis not present

## 2020-09-30 DIAGNOSIS — N400 Enlarged prostate without lower urinary tract symptoms: Secondary | ICD-10-CM | POA: Diagnosis not present

## 2020-09-30 DIAGNOSIS — E1149 Type 2 diabetes mellitus with other diabetic neurological complication: Secondary | ICD-10-CM | POA: Diagnosis not present

## 2020-09-30 DIAGNOSIS — I2510 Atherosclerotic heart disease of native coronary artery without angina pectoris: Secondary | ICD-10-CM | POA: Diagnosis not present

## 2020-09-30 DIAGNOSIS — Z89511 Acquired absence of right leg below knee: Secondary | ICD-10-CM | POA: Diagnosis not present

## 2020-09-30 DIAGNOSIS — J90 Pleural effusion, not elsewhere classified: Secondary | ICD-10-CM | POA: Diagnosis not present

## 2020-09-30 DIAGNOSIS — I739 Peripheral vascular disease, unspecified: Secondary | ICD-10-CM | POA: Diagnosis not present

## 2020-09-30 DIAGNOSIS — D693 Immune thrombocytopenic purpura: Secondary | ICD-10-CM | POA: Diagnosis present

## 2020-09-30 DIAGNOSIS — K851 Biliary acute pancreatitis without necrosis or infection: Secondary | ICD-10-CM | POA: Diagnosis present

## 2020-09-30 DIAGNOSIS — Z20822 Contact with and (suspected) exposure to covid-19: Secondary | ICD-10-CM | POA: Diagnosis present

## 2020-09-30 DIAGNOSIS — K8050 Calculus of bile duct without cholangitis or cholecystitis without obstruction: Secondary | ICD-10-CM | POA: Diagnosis not present

## 2020-09-30 LAB — CBC
HCT: 28.6 % — ABNORMAL LOW (ref 39.0–52.0)
Hemoglobin: 9 g/dL — ABNORMAL LOW (ref 13.0–17.0)
MCH: 30.9 pg (ref 26.0–34.0)
MCHC: 31.5 g/dL (ref 30.0–36.0)
MCV: 98.3 fL (ref 80.0–100.0)
Platelets: 100 10*3/uL — ABNORMAL LOW (ref 150–400)
RBC: 2.91 MIL/uL — ABNORMAL LOW (ref 4.22–5.81)
RDW: 17.5 % — ABNORMAL HIGH (ref 11.5–15.5)
WBC: 5.9 10*3/uL (ref 4.0–10.5)
nRBC: 0 % (ref 0.0–0.2)

## 2020-09-30 LAB — BASIC METABOLIC PANEL
Anion gap: 15 (ref 5–15)
BUN: 31 mg/dL — ABNORMAL HIGH (ref 8–23)
CO2: 24 mmol/L (ref 22–32)
Calcium: 8.8 mg/dL — ABNORMAL LOW (ref 8.9–10.3)
Chloride: 96 mmol/L — ABNORMAL LOW (ref 98–111)
Creatinine, Ser: 4.63 mg/dL — ABNORMAL HIGH (ref 0.61–1.24)
GFR, Estimated: 12 mL/min — ABNORMAL LOW (ref >60)
Glucose, Bld: 87 mg/dL (ref 70–99)
Potassium: 4 mmol/L (ref 3.5–5.1)
Sodium: 135 mmol/L (ref 135–145)

## 2020-09-30 LAB — RESP PANEL BY RT-PCR (FLU A&B, COVID) ARPGX2
Influenza A by PCR: NEGATIVE
Influenza B by PCR: NEGATIVE
SARS Coronavirus 2 by RT PCR: NEGATIVE

## 2020-09-30 LAB — GLUCOSE, CAPILLARY: Glucose-Capillary: 82 mg/dL (ref 70–99)

## 2020-09-30 MED ORDER — CEPHALEXIN 500 MG PO CAPS: 500.0000 mg | ORAL_CAPSULE | Freq: Two times a day (BID) | ORAL | Status: DC

## 2020-09-30 MED ORDER — CEPHALEXIN 500 MG PO CAPS: 500.0000 mg | ORAL_CAPSULE | Freq: Three times a day (TID) | ORAL | 0 refills | Status: DC

## 2020-09-30 MED ORDER — CEPHALEXIN 500 MG PO CAPS: 500.0000 mg | ORAL_CAPSULE | Freq: Three times a day (TID) | ORAL | Status: DC

## 2020-09-30 MED ORDER — CEPHALEXIN 500 MG PO CAPS: 500.0000 mg | ORAL_CAPSULE | Freq: Two times a day (BID) | ORAL | 0 refills | Status: AC

## 2020-09-30 NOTE — Discharge Summary (Signed)
[facility] NAME: Antonio Phillips    MR#:  [id number]  DATE OF BIRTH:  [birth year]  DATE OF ADMISSION:  09/26/2020 ADMITTING PHYSICIAN: Domenic Polite, MD  DATE OF DISCHARGE: 09/30/2020  PRIMARY CARE PHYSICIAN: Marsh Dolly, MD    ADMISSION DIAGNOSIS:  ESRD (end stage renal disease) ([place]) [N18.6] Elevated troponin [R77.8] Chest pain [R07.9] Chest pain with high risk for cardiac etiology [R07.9] Wound of skin [T14.8XXA] Chest pain, unspecified type [R07.9]  DISCHARGE DIAGNOSIS:  E coli Sepsis--improved  SECONDARY DIAGNOSIS:   Past Medical History:  Diagnosis Date  . Demand ischemia ([place])    a. 11/2017 elev Trop in setting of AFib/SVT-->Echo [facility]): EF>55%. Mild LVH. Nl RV fxn.  . Diabetes ([place])    a. Pt states that he has no hx of diabetes--A1c 5.5 11/2018.  Marland Kitchen ESRD (end stage renal disease) ([place])    a. On HD since 2019.  Marland Kitchen Hyperlipidemia   . Hypotension    a. On midodrine.  . Morbid obesity ([place])   . PAD (peripheral artery disease) ([facility])    a. s/p L AKA and R BKA.  Marland Kitchen Permanent atrial fibrillation ([place])    a. Dx 2019. CHA2DS2VASc = 3-4-->Eliquis.    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  Ecoli bacteremia -denies any symptoms of dysuria today, still makes some urine -Urinalysis abnormal, unfortunately urine culture was only sent 24 hours after admission, FU cultures--nega so far -Clinically improving, continue IV ceftriaxone--change to po keflex (total 10 days) -Repeat blood cultures are negative so far   Very small R buttock wound -Reportedly had a boil there that ruptured, appreciate Wound RN input -do not think this is source of bacteremia,  healing   Chest pain -Resolved, ruled out for ACS, mildly elevated high-sensitivity troponin likely from ESRD, recent non-STEMI   Recent NSTEMI.  Discharged on 7/20 - Cardiac cath showed single-vessel coronary artery disease with 99% stenosis of the second marginal lesion.  DES was placed 09/22/20 -Discharged on aspirin for 30 days--now discontinued by Cardiology --cont Plavix for 12 months -Eliquis  resumed at discharge for A. fib   ESRD on hemodialysis TTS -Nephrology following, HD today   Peripheral vascular disease left AKA and right BKA -Continue Plavix, statin   Chronic atrial fibrillation -Heart rate improved, continue low-dose metoprolol and Eliquis   Chronic hypotension -Midodrine resumed   Anemia of chronic disease -Hemoglobin 7.8 now, baseline around 8.5, anemia panel consistent with chronic disease -Will need EPO with HD     DVT prophylaxis: Apixaban Code Status: Full code Family Communication: Discussed patient in detail, no family at bedside Disposition Plan:  Status is: Inpatient     Dispo: The patient is from: SNF              Anticipated d/c is to: SNF today              Patient currently is  medically optimized and stable to d/c.              Difficult to place patient No   patient overall feels better. He appears to be at baseline. Discharge to [facility] long-term facility    CONSULTS OBTAINED:    DRUG  ALLERGIES:  No Known Allergies  DISCHARGE MEDICATIONS:   Allergies as of 09/30/2020   No Known Allergies      Medication List     STOP taking these medications    aspirin 81 MG chewable tablet       TAKE these medications    acetaminophen 325 MG tablet Commonly known as: TYLENOL Take 2 tablets (650 mg total) by mouth every 6 (six) hours as needed for mild pain or moderate pain.   apixaban 5 MG Tabs tablet Commonly known as: ELIQUIS Take 1 tablet (5 mg total) by mouth 2 (two) times daily.   atorvastatin 40 MG  tablet Commonly known as: LIPITOR Take 40 mg by mouth at bedtime.   Carboxymethylcellulose Sod PF 0.5 % Soln Apply 1 drop to eye 3 (three) times daily as needed (dry eyes).   carvedilol 3.125 MG tablet Commonly known as: COREG Take 1 tablet (3.125 mg total) by mouth 2 (two) times daily with a meal.   cephALEXin 500 MG capsule Commonly known as: KEFLEX Take 1 capsule (500 mg total) by mouth every 8 (eight) hours for 21 doses. Start taking on: October 01, 2020   clopidogrel 75 MG tablet Commonly known as: PLAVIX Take 1 tablet (75 mg total) by mouth daily with breakfast.   diclofenac Sodium 1 % Gel Commonly known as: VOLTAREN Apply 2 g topically daily. (Apply to right stump once every morning)   finasteride 5 MG tablet Commonly known as: PROSCAR Take 5 mg by mouth daily.   HYDROcodone-acetaminophen 5-325 MG tablet Commonly known as: NORCO/VICODIN Take 1 tablet by mouth every 6 (six) hours as needed for severe pain.   lactulose (encephalopathy) 10 GM/15ML Soln Commonly known as: CHRONULAC Take 10 g by mouth 2 (two) times daily as needed (mild constipation).   loperamide 2 MG tablet Commonly known as: IMODIUM A-D Take 2 tablets (4 mg total) by mouth 4 (four) times daily as needed for diarrhea or loose stools.   loratadine 10 MG tablet Commonly known as: CLARITIN Take 10 mg by mouth See admin instructions. Take 10 mg by mouth daily on Monday, Wednesday, Friday and Sunday   midodrine 5 MG tablet Commonly known as: PROAMATINE Take 5 mg by mouth daily.   midodrine 10 MG tablet Commonly known as: PROAMATINE Take 10 mg by mouth Every Tuesday,Thursday,and Saturday with dialysis.   mupirocin ointment 2 % Commonly known as: BACTROBAN Place 1 application into the nose 2 (two) times daily.   pregabalin 75 MG capsule Commonly known as: LYRICA Take 75 mg by mouth at bedtime.   rOPINIRole 1 MG tablet Commonly known as: REQUIP Take 1 mg by mouth at bedtime.   sevelamer  carbonate 800 MG tablet Commonly known as: RENVELA Take 1,600 mg by mouth See admin instructions. Take 2 tablets ('1600mg'$ ) by mouth three times daily with meals on Monday, Wednesday, Friday and Sunday   sevelamer carbonate 800 MG tablet Commonly known as: RENVELA Take 800 mg by mouth See admin instructions. Take 2 tablets ('1600mg'$ ) by mouth twice daily with meals (breakfast and dinner) on Tuesday, Thursday and Saturday   vitamin B-12 1000 MCG tablet Commonly known as: CYANOCOBALAMIN Take 1,000 mcg by mouth daily.               Discharge Care Instructions  (From admission, onward)           Start     Ordered   09/30/20 0000  Discharge wound care:  Comments: Foam dressing to left buttock, change Q3 days or PRN soiling   09/30/20 0904            If you experience worsening of your admission symptoms, develop shortness of breath, life threatening emergency, suicidal or homicidal thoughts you must seek medical attention immediately by calling 911 or calling your MD immediately  if symptoms less severe.  You Must read complete instructions/literature along with all the possible adverse reactions/side effects for all the Medicines you take and that have been prescribed to you. Take any new Medicines after you have completely understood and accept all the possible adverse reactions/side effects.   Please note  You were cared for by a hospitalist during your hospital stay. If you have any questions about your discharge medications or the care you received while you were in the hospital after you are discharged, you can call the unit and asked to speak with the hospitalist on call if the hospitalist that took care of you is not available. Once you are discharged, your primary care physician will handle any further medical issues. Please note that NO REFILLS for any discharge medications will be authorized once you are discharged, as it is imperative that you return to your  primary care physician (or establish a relationship with a primary care physician if you do not have one) for your aftercare needs so that they can reassess your need for medications and monitor your lab values. Today   SUBJECTIVE   no new complaints. Feels fine.  VITAL SIGNS:  Blood pressure 101/80, pulse 71, temperature 98.2 F (36.8 C), resp. rate 17, height '5\' 11"'$  (1.803 m), weight 98 kg, SpO2 98 %.  I/O:   Intake/Output Summary (Last 24 hours) at 09/30/2020 0905 Last data filed at 09/30/2020 0747 Gross per 24 hour  Intake 607.46 ml  Output 1379 ml  Net -771.54 ml    PHYSICAL EXAMINATION:  GENERAL:  75 y.o.-year-old patient lying in the bed with no acute distress.  LUNGS: Normal breath sounds bilaterally, no wheezing, rales,rhonchi or crepitation. No use of accessory muscles of respiration.  CARDIOVASCULAR: S1, S2 normal. No murmurs, rubs, or gallops.  ABDOMEN: Soft, non-tender, non-distended. Bowel sounds present. No organomegaly or mass.  EXTREMITIES: bilateral amputation stump  NEUROLOGIC: nonfocal PSYCHIATRIC: The patient is alert and oriented x 3.  SKIN: Pressure Injury 09/21/20 Buttocks Left Stage 2 -  Partial thickness loss of dermis presenting as a shallow open injury with a red, pink wound bed without slough. multiple small round ulcers clustered on left buttock. no drainage. red (Active)  09/21/20 0800  Location: Buttocks  Location Orientation: Left  Staging: Stage 2 -  Partial thickness loss of dermis presenting as a shallow open injury with a red, pink wound bed without slough.  Wound Description (Comments): multiple small round ulcers clustered on left buttock. no drainage. red  Present on Admission: Yes     Pressure Injury 09/21/20 Thigh Left;Posterior;Proximal Stage 2 -  Partial thickness loss of dermis presenting as a shallow open injury with a red, pink wound bed without slough. small round pustule filled red ulcers (Active)  09/21/20 0800  Location: Thigh   Location Orientation: Left;Posterior;Proximal  Staging: Stage 2 -  Partial thickness loss of dermis presenting as a shallow open injury with a red, pink wound bed without slough.  Wound Description (Comments): small round pustule filled red ulcers  Present on Admission: Yes       DATA REVIEW:   CBC  Recent Labs  Lab 09/30/20 0604  WBC 5.9  HGB 9.0*  HCT 28.6*  PLT 100*    Chemistries  Recent Labs  Lab 09/30/20 0604  NA 135  K 4.0  CL 96*  CO2 24  GLUCOSE 87  BUN 31*  CREATININE 4.63*  CALCIUM 8.8*    Microbiology Results   Recent Results (from the past 240 hour(s))  SARS CORONAVIRUS 2 (TAT 6-24 HRS) Nasopharyngeal Nasopharyngeal Swab     Status: None   Collection Time: 09/20/20  9:27 PM   Specimen: Nasopharyngeal Swab  Result Value Ref Range Status   SARS Coronavirus 2 NEGATIVE NEGATIVE Final    Comment: (NOTE) SARS-CoV-2 target nucleic acids are NOT DETECTED.  The SARS-CoV-2 RNA is generally detectable in upper and lower respiratory specimens during the acute phase of infection. Negative results do not preclude SARS-CoV-2 infection, do not rule out co-infections with other pathogens, and should not be used as the sole basis for treatment or other patient management decisions. Negative results must be combined with clinical observations, patient history, and epidemiological information. The expected result is Negative.  Fact Sheet for Patients: SugarRoll.be  Fact Sheet for Healthcare Providers: https://www.woods-mathews.com/  This test is not yet approved or cleared by the [place] FDA and  has been authorized for detection and/or diagnosis of SARS-CoV-2 by FDA under an Emergency Use Authorization (EUA). This EUA will remain  in effect (meaning this test can be used) for the duration of the COVID-19 declaration under Se ction 564(b)(1) of the Act, 21 U.S.C. section 360bbb-3(b)(1), unless the authorization  is terminated or revoked sooner.  Performed at [facility], [address] [place], [place] [address]   MRSA Next Gen by PCR, Nasal     Status: Abnormal   Collection Time: 09/21/20  1:20 PM   Specimen: Nasal Mucosa; Nasal Swab  Result Value Ref Range Status   MRSA by PCR Next Gen DETECTED (A) NOT DETECTED Final    Comment: CRITICAL RESULT CALLED TO, READ BACK BY AND VERIFIED WITH: Arnetha Courser [id number] 09/21/20 GM (NOTE) The GeneXpert MRSA Assay (FDA approved for NASAL specimens only), is one component of a comprehensive MRSA colonization surveillance program. It is not intended to diagnose MRSA infection nor to guide or monitor treatment for MRSA infections. Test performance is not FDA approved in patients less than 78 years old. Performed at [facility], [place], [place] [address]   Resp Panel by RT-PCR (Flu A&B, Covid) Nasopharyngeal Swab     Status: None   Collection Time: 09/26/20  4:04 PM   Specimen: Nasopharyngeal Swab; Nasopharyngeal(NP) swabs in vial transport medium  Result Value Ref Range Status   SARS Coronavirus 2 by RT PCR NEGATIVE NEGATIVE Final    Comment: (NOTE) SARS-CoV-2 target nucleic acids are NOT DETECTED.  The SARS-CoV-2 RNA is generally detectable in upper respiratory specimens during the acute phase of infection. The lowest concentration of SARS-CoV-2 viral copies this assay can detect is 138 copies/mL. A negative result does not preclude SARS-Cov-2 infection and should not be used as the sole basis for treatment or other patient management decisions. A negative result may occur with  improper specimen collection/handling, submission of specimen other than nasopharyngeal swab, presence of viral mutation(s) within the areas targeted by this assay, and inadequate number of viral copies(<138 copies/mL). A negative result must be combined with clinical observations, patient history, and epidemiological information. The  expected result is Negative.  Fact Sheet for Patients:  EntrepreneurPulse.com.au  Fact  Sheet for Healthcare Providers:  IncredibleEmployment.be  This test is no t yet approved or cleared by the [place] FDA and  has been authorized for detection and/or diagnosis of SARS-CoV-2 by FDA under an Emergency Use Authorization (EUA). This EUA will remain  in effect (meaning this test can be used) for the duration of the COVID-19 declaration under Section 564(b)(1) of the Act, 21 U.S.C.section 360bbb-3(b)(1), unless the authorization is terminated  or revoked sooner.       Influenza A by PCR NEGATIVE NEGATIVE Final   Influenza B by PCR NEGATIVE NEGATIVE Final    Comment: (NOTE) The Xpert Xpress SARS-CoV-2/FLU/RSV plus assay is intended as an aid in the diagnosis of influenza from Nasopharyngeal swab specimens and should not be used as a sole basis for treatment. Nasal washings and aspirates are unacceptable for Xpert Xpress SARS-CoV-2/FLU/RSV testing.  Fact Sheet for Patients: EntrepreneurPulse.com.au  Fact Sheet for Healthcare Providers: IncredibleEmployment.be  This test is not yet approved or cleared by the [place] FDA and has been authorized for detection and/or diagnosis of SARS-CoV-2 by FDA under an Emergency Use Authorization (EUA). This EUA will remain in effect (meaning this test can be used) for the duration of the COVID-19 declaration under Section 564(b)(1) of the Act, 21 U.S.C. section 360bbb-3(b)(1), unless the authorization is terminated or revoked.  Performed at [facility], [address] [place], [place] [address]   Blood culture (routine x 2)     Status: Abnormal   Collection Time: 09/26/20  4:06 PM   Specimen: BLOOD  Result Value Ref Range Status   Specimen Description   Final    BLOOD BLOOD RIGHT ARM Performed at [facility], [address],  [place], [place] [address]    Special Requests   Final    BOTTLES DRAWN AEROBIC AND ANAEROBIC Blood Culture results may not be optimal due to an inadequate volume of blood received in culture bottles Performed at [facility], [address] [place], [place] [address]    Culture  Setup Time   Final    Organism ID to follow [place] CRITICAL RESULT CALLED TO, READ BACK BY AND VERIFIED WITH: JASON ROBERTS AT [place] 09/27/2020 DLB Performed at [facility], [address] [place], [place] [address]    Culture ESCHERICHIA COLI (A)  Final   Report Status 09/29/2020 FINAL  Final   Organism ID, Bacteria ESCHERICHIA COLI  Final      Susceptibility   Escherichia coli - MIC*    AMPICILLIN 16 INTERMEDIATE Intermediate     CEFAZOLIN <=4 SENSITIVE Sensitive     CEFEPIME <=0.12 SENSITIVE Sensitive     CEFTAZIDIME <=1 SENSITIVE Sensitive     CEFTRIAXONE <=0.25 SENSITIVE Sensitive     CIPROFLOXACIN >=4 RESISTANT Resistant     GENTAMICIN <=1 SENSITIVE Sensitive     IMIPENEM 0.5 SENSITIVE Sensitive     TRIMETH/SULFA >=320 RESISTANT Resistant     AMPICILLIN/SULBACTAM 16 INTERMEDIATE Intermediate     PIP/TAZO 8 SENSITIVE Sensitive     * ESCHERICHIA COLI  Blood Culture ID Panel (Reflexed)     Status: Abnormal   Collection Time: 09/26/20  4:06 PM  Result Value Ref Range Status   Enterococcus faecalis NOT DETECTED NOT DETECTED Final   Enterococcus Faecium NOT DETECTED NOT DETECTED Final   Listeria monocytogenes NOT DETECTED NOT DETECTED Final   Staphylococcus species NOT DETECTED NOT DETECTED Final   Staphylococcus aureus (BCID) NOT DETECTED NOT DETECTED Final  Staphylococcus epidermidis NOT DETECTED NOT DETECTED Final   Staphylococcus lugdunensis NOT DETECTED NOT DETECTED Final   Streptococcus species NOT DETECTED NOT DETECTED Final   Streptococcus agalactiae NOT DETECTED NOT DETECTED Final   Streptococcus pneumoniae NOT DETECTED NOT DETECTED Final    Streptococcus pyogenes NOT DETECTED NOT DETECTED Final   A.calcoaceticus-baumannii NOT DETECTED NOT DETECTED Final   Bacteroides fragilis NOT DETECTED NOT DETECTED Final   Enterobacterales DETECTED (A) NOT DETECTED Final    Comment: Enterobacterales represent a large order of gram negative bacteria, not a single organism. CRITICAL RESULT CALLED TO, READ BACK BY AND VERIFIED WITH: JASON ROBERTS AT 0410 09/27/2020 DLB    Enterobacter cloacae complex NOT DETECTED NOT DETECTED Final   Escherichia coli DETECTED (A) NOT DETECTED Final    Comment: CRITICAL RESULT CALLED TO, READ BACK BY AND VERIFIED WITH: JASON ROBERTS AT 0410 09/27/2020 DLB    Klebsiella aerogenes NOT DETECTED NOT DETECTED Final   Klebsiella oxytoca NOT DETECTED NOT DETECTED Final   Klebsiella pneumoniae NOT DETECTED NOT DETECTED Final   Proteus species NOT DETECTED NOT DETECTED Final   Salmonella species NOT DETECTED NOT DETECTED Final   Serratia marcescens NOT DETECTED NOT DETECTED Final   Haemophilus influenzae NOT DETECTED NOT DETECTED Final   Neisseria meningitidis NOT DETECTED NOT DETECTED Final   Pseudomonas aeruginosa NOT DETECTED NOT DETECTED Final   Stenotrophomonas maltophilia NOT DETECTED NOT DETECTED Final   Candida albicans NOT DETECTED NOT DETECTED Final   Candida auris NOT DETECTED NOT DETECTED Final   Candida glabrata NOT DETECTED NOT DETECTED Final   Candida krusei NOT DETECTED NOT DETECTED Final   Candida parapsilosis NOT DETECTED NOT DETECTED Final   Candida tropicalis NOT DETECTED NOT DETECTED Final   Cryptococcus neoformans/gattii NOT DETECTED NOT DETECTED Final   CTX-M ESBL NOT DETECTED NOT DETECTED Final   Carbapenem resistance IMP NOT DETECTED NOT DETECTED Final   Carbapenem resistance KPC NOT DETECTED NOT DETECTED Final   Carbapenem resistance NDM NOT DETECTED NOT DETECTED Final   Carbapenem resist OXA 48 LIKE NOT DETECTED NOT DETECTED Final   Carbapenem resistance VIM NOT DETECTED NOT DETECTED  Final    Comment: Performed at [facility], [address] [place], [place] [address]  Blood culture (routine x 2)     Status: None (Preliminary result)   Collection Time: 09/26/20  6:00 PM   Specimen: BLOOD  Result Value Ref Range Status   Specimen Description BLOOD RIGHT ANTECUBITAL  Final   Special Requests   Final    BOTTLES DRAWN AEROBIC AND ANAEROBIC Blood Culture results may not be optimal due to an inadequate volume of blood received in culture bottles   Culture   Final    NO GROWTH 4 DAYS Performed at [facility], [address] [place], [place] [address]    Report Status PENDING  Incomplete  Urine Culture     Status: None   Collection Time: 09/27/20  2:55 PM   Specimen: Urine, Random  Result Value Ref Range Status   Specimen Description   Final    URINE, RANDOM Performed at [facility], [address], [place], [place] [address]    Special Requests   Final    NONE Performed at [facility], [address], [place], [place] [address]    Culture   Final    NO GROWTH Performed at [facility], [address], [place], [place] [address]    Report Status 09/28/2020 FINAL  Final  CULTURE, BLOOD (ROUTINE X 2) w Reflex to ID Panel     Status: None (Preliminary result)   Collection Time: 09/28/20  2:54 PM   Specimen: BLOOD  Result Value Ref Range Status   Specimen Description BLOOD BLOOD RIGHT HAND  Final   Special Requests   Final    BOTTLES DRAWN AEROBIC AND ANAEROBIC Blood Culture results may not be optimal due to an excessive volume of blood received in culture bottles   Culture   Final    NO GROWTH 2 DAYS Performed at [facility], [address], [place], [place] [address]    Report Status PENDING  Incomplete  CULTURE, BLOOD (ROUTINE X 2) w Reflex to ID Panel     Status: None (Preliminary result)   Collection Time: 09/28/20  3:09 PM   Specimen: BLOOD  Result Value Ref Range Status   Specimen Description  BLOOD RIGHT ANTECUBITAL  Final   Special Requests   Final    BOTTLES DRAWN AEROBIC AND ANAEROBIC Blood Culture adequate volume   Culture   Final    NO GROWTH 2 DAYS Performed at [facility], [address], [place], [place] [address]    Report Status PENDING  Incomplete    RADIOLOGY:  ECHOCARDIOGRAM LIMITED  Result Date: 09/28/2020    ECHOCARDIOGRAM LIMITED REPORT   Patient Name:   HIMMAT PROPPS [facility] Date of Exam: 09/28/2020 Medical Rec #:  [id number]          Height:       71.0 in Accession #:    [id number]         Weight:       216.0 lb Date of Birth:  03-27-1945          BSA:          2.179 m Patient Age:    24 years           BP:           116/61 mmHg Patient Gender: M                  HR:           89 bpm. Exam Location:  [facility] Procedure: 2D Echo, Color Doppler and Cardiac Doppler Indications:     CAD- native vessel I25.10  History:         Patient has prior history of Echocardiogram examinations, most                  recent 09/27/2020. Risk Factors:Diabetes. ESRD --dialysis                  patient.  Sonographer:     Sherrie Sport RDCS (AE) Referring Phys:  2040 Car[address] [place] ROSS Diagnosing Phys: Nelva Bush MD  Sonographer Comments: No apical window. IMPRESSIONS  1. Left ventricular ejection fraction, by estimation, is 45 to 50%. The left ventricle has mildly decreased function. Left ventricular endocardial border not optimally defined to evaluate regional wall motion. There is mild left ventricular hypertrophy.  2. Right ventricular systolic function is moderately reduced. The right ventricular size is normal. Mildly increased right ventricular wall thickness. There is moderately elevated pulmonary artery systolic pressure.  3. The mitral valve is abnormal. Trivial mitral valve regurgitation.  4. The aortic valve is tricuspid. There is mild thickening of the aortic valve.  5. The inferior vena cava is dilated in size with <50% respiratory variability, suggesting right atrial pressure of 15  mmHg. FINDINGS  Left Ventricle: Left ventricular ejection fraction, by estimation, is 45 to 50%. The left ventricle has mildly decreased function. Left ventricular endocardial border not optimally defined to evaluate regional wall motion. There is mild left ventricular  hypertrophy. Right Ventricle: The right ventricular size is normal. Mildly increased right ventricular wall thickness. Right ventricular systolic function is moderately reduced. There is moderately elevated pulmonary artery systolic pressure. The tricuspid regurgitant velocity is 2.74 m/s, and with an assumed right atrial pressure of 15 mmHg, the estimated right ventricular systolic pressure is 0000000 mmHg. Pericardium: Trivial pericardial effusion is present. Presence of pericardial fat pad. Mitral Valve: The mitral valve is abnormal. Mild to moderate mitral annular calcification. Trivial mitral valve regurgitation. Tricuspid Valve: The tricuspid valve is grossly normal. Aortic Valve: The aortic valve is tricuspid. There is mild thickening of the aortic valve. Pulmonic Valve: The pulmonic valve was not well visualized. Pulmonic valve regurgitation is trivial. No evidence of pulmonic stenosis. Aorta: The aortic root is normal in size and structure. Pulmonary Artery: The pulmonary artery is not well seen. Venous: The inferior vena cava is dilated in size with less than 50% respiratory variability, suggesting right atrial pressure of 15 mmHg. LEFT VENTRICLE PLAX 2D LVIDd:         4.91 cm LVIDs:         3.53 cm LV PW:         1.46 cm LV IVS:        1.18 cm LVOT diam:     2.10 cm LVOT Area:     3.46 cm  LEFT ATRIUM         Index LA diam:    4.70 cm 2.16 cm/m                        PULMONIC VALVE AORTA                 PV Vmax:        0.60 m/s Ao Root diam: 3.50 cm PV Peak grad:   1.5 mmHg                       RVOT Peak grad: 2 mmHg  TRICUSPID VALVE TR Peak grad:   30.0 mmHg TR Vmax:        274.00 cm/s  SHUNTS Systemic Diam: 2.10 cm Nelva Bush MD  Electronically signed by Nelva Bush MD Signature Date/Time: 09/28/2020/5:10:35 PM    Final      CODE STATUS:     Code Status Orders  (From admission, onward)           Start     Ordered   09/26/20 1653  Full code  Continuous        09/26/20 1655           Code Status History     Date Active Date Inactive Code Status Order ID Comments User Context   09/20/2020 1917 09/23/2020 1752 Full Code [id number]  Cox, [place], DO ED   01/13/2020 2324 01/17/2020 2320 Full Code [id number]  Cox, [place], DO Inpatient   09/09/2019 1722 09/11/2019 0717 Full Code [id number]  Karie Kirks, DO ED   11/09/2018 0916 11/14/2018 2042 Full Code [id number]  Harrie Foreman, MD Inpatient   04/25/2018 0737 04/26/2018 0056 Full Code [id number]  Lance Coon, MD ED        TOTAL TIME TAKING CARE OF THIS PATIENT: 40 minutes.    Nemiah Bubar  Posey Pronto M.D  [facility]    CC: Primary care physician; Marsh Dolly, MD

## 2020-09-30 NOTE — Plan of Care (Signed)
  Problem: Health Behavior/Discharge Planning: Goal: Ability to manage health-related needs will improve 09/30/2020 0903 by Cristela Blue, RN Outcome: Progressing 09/30/2020 0903 by Cristela Blue, RN Outcome: Progressing   Problem: Clinical Measurements: Goal: Ability to maintain clinical measurements within normal limits will improve 09/30/2020 0903 by Cristela Blue, RN Outcome: Progressing 09/30/2020 0903 by Cristela Blue, RN Outcome: Progressing Goal: Will remain free from infection 09/30/2020 0903 by Cristela Blue, RN Outcome: Progressing 09/30/2020 0903 by Cristela Blue, RN Outcome: Progressing Goal: Diagnostic test results will improve 09/30/2020 0903 by Cristela Blue, RN Outcome: Progressing 09/30/2020 0903 by Cristela Blue, RN Outcome: Progressing Goal: Respiratory complications will improve 09/30/2020 0903 by Cristela Blue, RN Outcome: Progressing 09/30/2020 0903 by Cristela Blue, RN Outcome: Progressing Goal: Cardiovascular complication will be avoided 09/30/2020 0903 by Cristela Blue, RN Outcome: Progressing 09/30/2020 0903 by Cristela Blue, RN Outcome: Progressing   Problem: Pain Managment: Goal: General experience of comfort will improve 09/30/2020 0903 by Cristela Blue, RN Outcome: Progressing 09/30/2020 0903 by Cristela Blue, RN Outcome: Progressing

## 2020-09-30 NOTE — NC FL2 (Signed)
[place] LEVEL OF CARE SCREENING TOOL     IDENTIFICATION  Patient Name: Antonio Phillips Birthdate: [birth year] Sex: male Admission Date (Current Location): 09/26/2020  [place] [facility] and [place] Number:  Engineering geologist and Address:  [facility], [address], [place], [place] [address]      Provider Number: [id number]  Attending Physician Name and Address:  Fritzi Mandes, MD  Relative Name and Phone Number:       Current Level of Care: Hospital Recommended Level of Care: [place] Prior Approval Number:    Date Approved/Denied:   PASRR Number:    Discharge Plan: SNF    Current Diagnoses: Patient Active Problem List   Diagnosis Date Noted   Sepsis due to Escherichia coli [facility])    Wound of skin    Chronic hypotension    Demand ischemia [facility])    Permanent atrial fibrillation ([place])    Chest pain 09/26/2020   PVD (peripheral vascular disease) ([place])    Shortness of breath 09/20/2020   NSTEMI (non-ST elevated myocardial infarction) ([place]) 09/20/2020   Fungal skin infection    Atrial fibrillation, chronic ([place])    Acquired thrombophilia ([place])    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[place])    Anemia of chronic disease    Thrombocytopenia ([place])    Sacral wound 01/13/2020   Pressure injury of skin 01/13/2020   Generalized weakness 01/13/2020   ESRD (end stage renal disease) ([place]) 04/25/2018    Orientation RESPIRATION BLADDER Height & Weight     Self, Time, Situation, Place  Normal Continent Weight: 216 lb 0.8 oz (98 kg) Height:  '5\' 11"'$  (180.3 cm)  BEHAVIORAL SYMPTOMS/MOOD NEUROLOGICAL BOWEL NUTRITION STATUS      Continent Diet (renal with fluid restriction Fluid restriction: 1500 mL Fluid)  AMBULATORY STATUS COMMUNICATION OF NEEDS Skin   Extensive Assist Verbally PU Stage and Appropriate Care   PU Stage 2 Dressing:  (located on buttock and left thigh)                   Personal  Care Assistance Level of Assistance  Feeding, Bathing, Dressing Bathing Assistance: Maximum assistance Feeding assistance: Maximum assistance Dressing Assistance: Maximum assistance Total Care Assistance: Maximum assistance   Functional Limitations Info  Sight, Hearing, Speech Sight Info: Adequate Hearing Info: Adequate Speech Info: Adequate    SPECIAL CARE FACTORS FREQUENCY  PT (By licensed PT), OT (By licensed OT)     PT Frequency: 5x OT Frequency: 5x            Contractures Contractures Info: Not present    Additional Factors Info  Code Status, Allergies Code Status Info: full code Allergies Info: no known allergies           Current Medications (09/30/2020):  This is the current hospital active medication list Current Facility-Administered Medications  Medication Dose Route Frequency Provider Last Rate Last Admin   0.9 %  sodium chloride infusion   Intravenous PRN Domenic Polite, MD 10 mL/hr at 09/30/20 0506 250 mL at 09/30/20 0506   acetaminophen (TYLENOL) tablet 650 mg  650 mg Oral Q6H PRN Imagene Sheller S, DO   650 mg at 09/26/20 1927   Or   acetaminophen (TYLENOL) suppository 650 mg  650 mg Rectal Q6H PRN Imagene Sheller S, DO       apixaban (ELIQUIS) tablet 5 mg  5 mg Oral BID Domenic Polite, MD   5 mg at 09/30/20  0831   atorvastatin (LIPITOR) tablet 40 mg  40 mg Oral QHS Imagene Sheller S, DO   40 mg at 09/29/20 2129   [START ON 10/01/2020] cephALEXin (KEFLEX) capsule 500 mg  500 mg Oral Q12H Fritzi Mandes, MD       Chlorhexidine Gluconate Cloth 2 % PADS 6 each  6 each Topical Q0600 Lyla Son, MD   6 each at 09/30/20 0644   clopidogrel (PLAVIX) tablet 75 mg  75 mg Oral Q breakfast Imagene Sheller S, DO   75 mg at 09/30/20 [id number]   diclofenac Sodium (VOLTAREN) 1 % topical gel 2 g  2 g Topical TID PRN Domenic Polite, MD   2 g at 09/30/20 0834   epoetin alfa (EPOGEN) injection 4,000 Units  4,000 Units Intravenous Q T,Th,Sa-HD Colon Flattery, NP   4,000 Units at  09/29/20 1200   finasteride (PROSCAR) tablet 5 mg  5 mg Oral Daily Imagene Sheller S, DO   5 mg at 09/30/20 0830   HYDROcodone-acetaminophen (NORCO/VICODIN) 5-325 MG per tablet 1-2 tablet  1-2 tablet Oral Q4H PRN Imagene Sheller S, DO   2 tablet at 09/30/20 0839   loratadine (CLARITIN) tablet 10 mg  10 mg Oral Once per day on Sun Mon Wed Fri Imagene Sheller S, DO   10 mg at 09/30/20 [id number]   metoprolol tartrate (LOPRESSOR) tablet 12.5 mg  12.5 mg Oral BID Domenic Polite, MD   12.5 mg at 09/30/20 0831   midodrine (PROAMATINE) tablet 10 mg  10 mg Oral Q T,Th,Sa-HD Imagene Sheller S, DO   10 mg at 09/29/20 1040   midodrine (PROAMATINE) tablet 5 mg  5 mg Oral Daily Imagene Sheller S, DO   5 mg at 09/30/20 0829   ondansetron (ZOFRAN) tablet 4 mg  4 mg Oral Q6H PRN Imagene Sheller S, DO       Or   ondansetron (ZOFRAN) injection 4 mg  4 mg Intravenous Q6H PRN Rowe Pavy, Shayan S, DO       pantoprazole (PROTONIX) EC tablet 40 mg  40 mg Oral Daily End, Christopher, MD   40 mg at 09/30/20 0830   polyethylene glycol (MIRALAX / GLYCOLAX) packet 17 g  17 g Oral Daily PRN Imagene Sheller S, DO       polyvinyl alcohol (LIQUIFILM TEARS) 1.4 % ophthalmic solution 1 drop  1 drop Both Eyes TID PRN Imagene Sheller S, DO       pregabalin (LYRICA) capsule 75 mg  75 mg Oral QHS Anwar, Bonnee Quin S, DO   75 mg at 09/29/20 2130   rOPINIRole (REQUIP) tablet 1 mg  1 mg Oral QHS Imagene Sheller S, DO   1 mg at 09/29/20 2129   sevelamer carbonate (RENVELA) tablet 1,600 mg  1,600 mg Oral 3 times per day on Sun Mon Wed Fri Imagene Sheller S, DO   1,600 mg at 09/30/20 [id number]   sevelamer carbonate (RENVELA) tablet 800 mg  800 mg Oral 2 times per day on Tue Thu Sat Imagene Sheller S, DO   800 mg at 09/29/20 1714   vitamin B-12 (CYANOCOBALAMIN) tablet 1,000 mcg  1,000 mcg Oral Daily Imagene Sheller S, DO   1,000 mcg at 09/30/20 [id number]     Discharge Medications: Please see discharge summary for a list of discharge medications.  Relevant Imaging  Results:  Relevant Lab Results:   Additional Information [id number]  Gerrianne Scale Aleida Crandell, LCSW

## 2020-09-30 NOTE — Progress Notes (Signed)
[facility]  ROUNDING NOTE   Subjective:   Antonio Phillips is a 75 y.o. male with a past medical history of hypertension, CAD, rt BKA, and ESRD on dialysis. He presented to the ED from his SNF for chest pain, chills and rigors  Patient had a recent admission last week for chest pain and underwent cardiac cath with stent placement.   Patient currently receives dialysis at [facility], supervised by Dr. Candiss Norse. We have been consulted to manage dialysis during this admission  Patient laying in bed Alert and oriented States he is ready for discharge when allowed   Objective:  Vital signs in last 24 hours:  Temp:  [97.8 F (36.6 C)-98.4 F (36.9 C)] 98.1 F (36.7 C) (07/27 1113) Pulse Rate:  [69-87] 87 (07/27 1113) Resp:  [12-20] 16 (07/27 1113) BP: (95-130)/(43-100) 95/73 (07/27 1113) SpO2:  [98 %-100 %] 100 % (07/27 1113)  Weight change:  Filed Weights   09/26/20 1230  Weight: 98 kg    Intake/Output: I/O last 3 completed shifts: In: 607.5 [P.O.:480; I.V.:27.5; IV Piggyback:100] Out: [id number] [Urine:850; Other:1004]   Intake/Output this shift:  Total I/O In: 240 [P.O.:240] Out: -   Physical Exam: General: NAD, resting in bed  Head: Normocephalic, atraumatic. Moist oral mucosal membranes  Eyes: Anicteric  Lungs:  Clear to auscultation, normal effort  Heart: Regular rate and rhythm  Abdomen:  Soft, nontender  Extremities:  trace peripheral edema.Rt BKA, Lt AKA  Neurologic: Nonfocal, moving all four extremities  Skin: No lesions  Access: Lt AVF    Basic Metabolic Panel: Recent Labs  Lab 09/26/20 1542 09/27/20 0707 09/28/20 0742 09/29/20 0416 09/30/20 0604  NA 135 135 134* 134* 135  K 6.3* 3.8 4.4 4.6 4.0  CL 98 99 98 95* 96*  CO2 21* '25 23 27 24  '$ GLUCOSE 101* 113* 91 119* 87  BUN 41* 26* 35* 45* 31*  CREATININE 6.00* 4.11* 5.31* 6.02* 4.63*  CALCIUM 9.3 8.6* 8.7* 8.6* 8.8*  PHOS 4.9* 3.7  --   --   --      Liver Function  Tests: Recent Labs  Lab 09/26/20 1542 09/27/20 0707  ALBUMIN 3.6 2.7*    No results for input(s): LIPASE, AMYLASE in the last 168 hours. No results for input(s): AMMONIA in the last 168 hours.  CBC: Recent Labs  Lab 09/26/20 1542 09/27/20 0707 09/28/20 0742 09/29/20 0416 09/30/20 0604  WBC 8.3 5.9 5.9 5.5 5.9  HGB 10.4* 7.8* 8.3* 8.2* 9.0*  HCT 33.7* 24.5* 26.3* 25.3* 28.6*  MCV 99.1 99.6 98.1 97.7 98.3  PLT 139* 96* 103* 103* 100*     Cardiac Enzymes: No results for input(s): CKTOTAL, CKMB, CKMBINDEX, TROPONINI in the last 168 hours.  BNP: Invalid input(s): POCBNP  CBG: Recent Labs  Lab 09/26/20 1510 09/27/20 0853 09/30/20 0753  GLUCAP 102* 122* 82     Microbiology: Results for orders placed or performed during the hospital encounter of 09/26/20  Resp Panel by RT-PCR (Flu A&B, Covid) Nasopharyngeal Swab     Status: None   Collection Time: 09/26/20  4:04 PM   Specimen: Nasopharyngeal Swab; Nasopharyngeal(NP) swabs in vial transport medium  Result Value Ref Range Status   SARS Coronavirus 2 by RT PCR NEGATIVE NEGATIVE Final    Comment: (NOTE) SARS-CoV-2 target nucleic acids are NOT DETECTED.  The SARS-CoV-2 RNA is generally detectable in upper respiratory specimens during the acute phase of infection. The lowest concentration of SARS-CoV-2 viral copies this assay can  detect is 138 copies/mL. A negative result does not preclude SARS-Cov-2 infection and should not be used as the sole basis for treatment or other patient management decisions. A negative result may occur with  improper specimen collection/handling, submission of specimen other than nasopharyngeal swab, presence of viral mutation(s) within the areas targeted by this assay, and inadequate number of viral copies(<138 copies/mL). A negative result must be combined with clinical observations, patient history, and epidemiological information. The expected result is Negative.  Fact Sheet for  Patients:  EntrepreneurPulse.com.au  Fact Sheet for Healthcare Providers:  IncredibleEmployment.be  This test is no t yet approved or cleared by the [place] FDA and  has been authorized for detection and/or diagnosis of SARS-CoV-2 by FDA under an Emergency Use Authorization (EUA). This EUA will remain  in effect (meaning this test can be used) for the duration of the COVID-19 declaration under Section 564(b)(1) of the Act, 21 U.S.C.section 360bbb-3(b)(1), unless the authorization is terminated  or revoked sooner.       Influenza A by PCR NEGATIVE NEGATIVE Final   Influenza B by PCR NEGATIVE NEGATIVE Final    Comment: (NOTE) The Xpert Xpress SARS-CoV-2/FLU/RSV plus assay is intended as an aid in the diagnosis of influenza from Nasopharyngeal swab specimens and should not be used as a sole basis for treatment. Nasal washings and aspirates are unacceptable for Xpert Xpress SARS-CoV-2/FLU/RSV testing.  Fact Sheet for Patients: EntrepreneurPulse.com.au  Fact Sheet for Healthcare Providers: IncredibleEmployment.be  This test is not yet approved or cleared by the [place] FDA and has been authorized for detection and/or diagnosis of SARS-CoV-2 by FDA under an Emergency Use Authorization (EUA). This EUA will remain in effect (meaning this test can be used) for the duration of the COVID-19 declaration under Section 564(b)(1) of the Act, 21 U.S.C. section 360bbb-3(b)(1), unless the authorization is terminated or revoked.  Performed at [facility], [address] [place], [place] [address]   Blood culture (routine x 2)     Status: Abnormal   Collection Time: 09/26/20  4:06 PM   Specimen: BLOOD  Result Value Ref Range Status   Specimen Description   Final    BLOOD BLOOD RIGHT ARM Performed at [facility], [address], [place], [place] [address]    Special Requests   Final     BOTTLES DRAWN AEROBIC AND ANAEROBIC Blood Culture results may not be optimal due to an inadequate volume of blood received in culture bottles Performed at [facility], [address] [place], [place] [address]    Culture  Setup Time   Final    Organism ID to follow [place] CRITICAL RESULT CALLED TO, READ BACK BY AND VERIFIED WITH: JASON ROBERTS AT [place] 09/27/2020 DLB Performed at [facility], [address] [place],  [address]    Culture ESCHERICHIA COLI (A)  Final   Report Status 09/29/2020 FINAL  Final   Organism ID, Bacteria ESCHERICHIA COLI  Final      Susceptibility   Escherichia coli - MIC*    AMPICILLIN 16 INTERMEDIATE Intermediate     CEFAZOLIN <=4 SENSITIVE Sensitive     CEFEPIME <=0.12 SENSITIVE Sensitive     CEFTAZIDIME <=1 SENSITIVE Sensitive     CEFTRIAXONE <=0.25 SENSITIVE Sensitive     CIPROFLOXACIN >=4 RESISTANT Resistant     GENTAMICIN <=1 SENSITIVE Sensitive     IMIPENEM 0.5 SENSITIVE Sensitive     TRIMETH/SULFA >=320 RESISTANT Resistant  AMPICILLIN/SULBACTAM 16 INTERMEDIATE Intermediate     PIP/TAZO 8 SENSITIVE Sensitive     * ESCHERICHIA COLI  Blood Culture ID Panel (Reflexed)     Status: Abnormal   Collection Time: 09/26/20  4:06 PM  Result Value Ref Range Status   Enterococcus faecalis NOT DETECTED NOT DETECTED Final   Enterococcus Faecium NOT DETECTED NOT DETECTED Final   Listeria monocytogenes NOT DETECTED NOT DETECTED Final   Staphylococcus species NOT DETECTED NOT DETECTED Final   Staphylococcus aureus (BCID) NOT DETECTED NOT DETECTED Final   Staphylococcus epidermidis NOT DETECTED NOT DETECTED Final   Staphylococcus lugdunensis NOT DETECTED NOT DETECTED Final   Streptococcus species NOT DETECTED NOT DETECTED Final   Streptococcus agalactiae NOT DETECTED NOT DETECTED Final   Streptococcus pneumoniae NOT DETECTED NOT DETECTED Final   Streptococcus pyogenes NOT DETECTED NOT DETECTED  Final   A.calcoaceticus-baumannii NOT DETECTED NOT DETECTED Final   Bacteroides fragilis NOT DETECTED NOT DETECTED Final   Enterobacterales DETECTED (A) NOT DETECTED Final    Comment: Enterobacterales represent a large order of gram negative bacteria, not a single organism. CRITICAL RESULT CALLED TO, READ BACK BY AND VERIFIED WITH: JASON ROBERTS AT 0410 09/27/2020 DLB    Enterobacter cloacae complex NOT DETECTED NOT DETECTED Final   Escherichia coli DETECTED (A) NOT DETECTED Final    Comment: CRITICAL RESULT CALLED TO, READ BACK BY AND VERIFIED WITH: JASON ROBERTS AT 0410 09/27/2020 DLB    Klebsiella aerogenes NOT DETECTED NOT DETECTED Final   Klebsiella oxytoca NOT DETECTED NOT DETECTED Final   Klebsiella pneumoniae NOT DETECTED NOT DETECTED Final   Proteus species NOT DETECTED NOT DETECTED Final   Salmonella species NOT DETECTED NOT DETECTED Final   Serratia marcescens NOT DETECTED NOT DETECTED Final   Haemophilus influenzae NOT DETECTED NOT DETECTED Final   Neisseria meningitidis NOT DETECTED NOT DETECTED Final   Pseudomonas aeruginosa NOT DETECTED NOT DETECTED Final   Stenotrophomonas maltophilia NOT DETECTED NOT DETECTED Final   Candida albicans NOT DETECTED NOT DETECTED Final   Candida auris NOT DETECTED NOT DETECTED Final   Candida glabrata NOT DETECTED NOT DETECTED Final   Candida krusei NOT DETECTED NOT DETECTED Final   Candida parapsilosis NOT DETECTED NOT DETECTED Final   Candida tropicalis NOT DETECTED NOT DETECTED Final   Cryptococcus neoformans/gattii NOT DETECTED NOT DETECTED Final   CTX-M ESBL NOT DETECTED NOT DETECTED Final   Carbapenem resistance IMP NOT DETECTED NOT DETECTED Final   Carbapenem resistance KPC NOT DETECTED NOT DETECTED Final   Carbapenem resistance NDM NOT DETECTED NOT DETECTED Final   Carbapenem resist OXA 48 LIKE NOT DETECTED NOT DETECTED Final   Carbapenem resistance VIM NOT DETECTED NOT DETECTED Final    Comment: Performed at [facility], [address] [place], [place] [address]  Blood culture (routine x 2)     Status: None (Preliminary result)   Collection Time: 09/26/20  6:00 PM   Specimen: BLOOD  Result Value Ref Range Status   Specimen Description BLOOD RIGHT ANTECUBITAL  Final   Special Requests   Final    BOTTLES DRAWN AEROBIC AND ANAEROBIC Blood Culture results may not be optimal due to an inadequate volume of blood received in culture bottles   Culture   Final    NO GROWTH 4 DAYS Performed at [facility], [address], [place], [place] [address]    Report Status PENDING  Incomplete  Urine Culture     Status: None   Collection Time: 09/27/20  2:55 PM  Specimen: Urine, Random  Result Value Ref Range Status   Specimen Description   Final    URINE, RANDOM Performed at [facility], [address], [place], [place] [address]    Special Requests   Final    NONE Performed at [facility], [address], [place], [place] [address]    Culture   Final    NO GROWTH Performed at [facility], [place] [address] [place], [place] [address]    Report Status 09/28/2020 FINAL  Final  CULTURE, BLOOD (ROUTINE X 2) w Reflex to ID Panel     Status: None (Preliminary result)   Collection Time: 09/28/20  2:54 PM   Specimen: BLOOD  Result Value Ref Range Status   Specimen Description BLOOD BLOOD RIGHT HAND  Final   Special Requests   Final    BOTTLES DRAWN AEROBIC AND ANAEROBIC Blood Culture results may not be optimal due to an excessive volume of blood received in culture bottles   Culture   Final    NO GROWTH 2 DAYS Performed at [facility], [address], [place], [place] [address]    Report Status PENDING  Incomplete  CULTURE, BLOOD (ROUTINE X 2) w Reflex to ID Panel     Status: None (Preliminary result)   Collection Time: 09/28/20  3:09 PM   Specimen: BLOOD  Result Value Ref Range Status   Specimen Description BLOOD RIGHT ANTECUBITAL  Final   Special  Requests   Final    BOTTLES DRAWN AEROBIC AND ANAEROBIC Blood Culture adequate volume   Culture   Final    NO GROWTH 2 DAYS Performed at [facility], [address] [place],  [address]    Report Status PENDING  Incomplete    Coagulation Studies: No results for input(s): LABPROT, INR in the last 72 hours.   Urinalysis: Recent Labs    09/27/20 1455  COLORURINE YELLOW*  LABSPEC 1.009  PHURINE 9.0*  GLUCOSEU 150*  HGBUR SMALL*  BILIRUBINUR NEGATIVE  KETONESUR NEGATIVE  PROTEINUR 100*  NITRITE NEGATIVE  LEUKOCYTESUR LARGE*       Imaging: No results found.   Medications:    sodium chloride 250 mL (09/30/20 0506)    apixaban  5 mg Oral BID   atorvastatin  40 mg Oral QHS   [START ON 10/01/2020] cephALEXin  500 mg Oral Q12H   Chlorhexidine Gluconate Cloth  6 each Topical Q0600   clopidogrel  75 mg Oral Q breakfast   epoetin (EPOGEN/PROCRIT) injection  4,000 Units Intravenous Q T,Th,Sa-HD   finasteride  5 mg Oral Daily   loratadine  10 mg Oral Once per day on Sun Mon Wed Fri   metoprolol tartrate  12.5 mg Oral BID   midodrine  10 mg Oral Q T,Th,Sa-HD   midodrine  5 mg Oral Daily   pantoprazole  40 mg Oral Daily   pregabalin  75 mg Oral QHS   rOPINIRole  1 mg Oral QHS   sevelamer carbonate  1,600 mg Oral 3 times per day on Sun Mon Wed Fri   sevelamer carbonate  800 mg Oral 2 times per day on Tue Thu Sat   vitamin B-12  1,000 mcg Oral Daily   sodium chloride, acetaminophen **OR** acetaminophen, diclofenac Sodium, HYDROcodone-acetaminophen, ondansetron **OR** ondansetron (ZOFRAN) IV, polyethylene glycol, polyvinyl alcohol  Assessment/ Plan:  Mr. NIKKOLAI HARTL is a 75 y.o.  male with a past medical history of hypertension, CAD, rt BKA, and ESRD on  dialysis. He presented to the ED from his SNF for chest pain and shortness of breath.   [facility]/TTS/Lt AVF  1. End stage renal disease on dialysis Will maintain outpatient schedule  during this admission Received dialysis yesterday, UF 1L acheived If discharged, patient can resume treatments at outpatient clinic  2. Anemia of chronic kidney disease   Lab Results  Component Value Date   HGB 9.0 (L) 09/30/2020   Hgb below target EPO with treatments   3. Secondary Hyperparathyroidism:   Lab Results  Component Value Date   CALCIUM 8.8 (L) 09/30/2020   PHOS 3.7 09/27/2020  Phosphorus of 3.7 at goal [place]a ordered with meals      LOS: 4 Latha Staunton 7/27/202211:22 AM

## 2020-09-30 NOTE — TOC Transition Note (Signed)
Transition of Care [facility]) - CM/SW Discharge Note   Patient Details  Name: Antonio Phillips MRN: [id number] Date of Birth: Jun 06, 1945  Transition of Care [facility]) CM/SW Contact:  Eileen Stanford, LCSW Phone Number: 09/30/2020, 10:49 AM   Clinical Narrative:   Clinical Social Worker facilitated patient discharge including contacting patient family and facility to confirm patient discharge plans.  Clinical information faxed to facility and family agreeable with plan.  CSW arranged ambulance transport via [facility] to [facility] (room 211B) .  RN to call [phone] for report prior to discharge.     Final next level of care: Skilled Nursing Facility Barriers to Discharge: No Barriers Identified   Patient Goals and CMS Choice        Discharge Placement              Patient chooses bed at:  ([facility]) Patient to be transferred to facility by: [facility]   Patient and family notified of of transfer: 09/30/20  Discharge Plan and Services                                     Social Determinants of Health (SDOH) Interventions     Readmission Risk Interventions Readmission Risk Prevention Plan 09/10/2019 11/14/2018 11/09/2018  Transportation Screening Complete Complete Complete  PCP or Specialist Appt within 3-5 Days Patient refused Patient refused -  [place] or [place] Patient refused - -  Social Work Consult for [place] Planning/Counseling Complete - -  [place] Screening Not Applicable Not Applicable -  Medication Review (RN Care Manager) Complete Complete Complete  Some recent data might be hidden

## 2020-09-30 NOTE — TOC Progression Note (Signed)
Transition of Care [facility]) - Progression Note    Patient Details  Name: Antonio Phillips MRN: [id number] Date of Birth: 09-May-1945  Transition of Care [facility]) CM/SW [place], LCSW Phone Number: 09/30/2020, 10:48 AM  Clinical Narrative:   Pt is from [place] and will return at d/c. CSW spoke with Neoma Laming at [facility] and they are ready to accept pt back.         Expected Discharge Plan and Services           Expected Discharge Date: 09/30/20                                     Social Determinants of Health (SDOH) Interventions    Readmission Risk Interventions Readmission Risk Prevention Plan 09/10/2019 11/14/2018 11/09/2018  Transportation Screening Complete Complete Complete  PCP or Specialist Appt within 3-5 Days Patient refused Patient refused -  [place] or [place] Patient refused - -  Social Work Consult for [place] Planning/Counseling Complete - -  Palliative Care Screening Not Applicable Not Applicable -  Medication Review (RN Care Manager) Complete Complete Complete  Some recent data might be hidden

## 2020-09-30 NOTE — Discharge Instructions (Signed)
Resume outpatient dialysis schedule as before Tuesday Thursday Saturday

## 2020-09-30 NOTE — Progress Notes (Signed)
This RN provided report to Caryl Pina, nurse assuming care of patient at [facility]. All outstanding questions resolved. This RN also provided patient with discharge instructions and teaching. Pt verbalized and demonstrated understanding of provided instructions. All outstanding questions resolved. X2 R arm PIVs removed. Both Cannulas intact. Pt tolerated well. All belongings packed and in tow. Pt to be transported to receiving facility via EMS at time of departure.

## 2020-10-01 DIAGNOSIS — Z89511 Acquired absence of right leg below knee: Secondary | ICD-10-CM | POA: Diagnosis not present

## 2020-10-01 DIAGNOSIS — G546 Phantom limb syndrome with pain: Secondary | ICD-10-CM | POA: Diagnosis not present

## 2020-10-01 DIAGNOSIS — Z760 Encounter for issue of repeat prescription: Secondary | ICD-10-CM | POA: Diagnosis not present

## 2020-10-01 DIAGNOSIS — Z89612 Acquired absence of left leg above knee: Secondary | ICD-10-CM | POA: Diagnosis not present

## 2020-10-01 LAB — CULTURE, BLOOD (ROUTINE X 2): Culture: NO GROWTH

## 2020-10-05 DIAGNOSIS — I25810 Atherosclerosis of coronary artery bypass graft(s) without angina pectoris: Secondary | ICD-10-CM | POA: Diagnosis not present

## 2020-10-05 DIAGNOSIS — I482 Chronic atrial fibrillation, unspecified: Secondary | ICD-10-CM | POA: Diagnosis not present

## 2020-10-05 DIAGNOSIS — N186 End stage renal disease: Secondary | ICD-10-CM | POA: Diagnosis not present

## 2020-10-05 DIAGNOSIS — R54 Age-related physical debility: Secondary | ICD-10-CM | POA: Diagnosis not present

## 2020-10-06 LAB — CULTURE, BLOOD (ROUTINE X 2)
Culture: NO GROWTH
Culture: NO GROWTH
Special Requests: ADEQUATE

## 2020-10-08 NOTE — Progress Notes (Deleted)
Cardiology Office Note  Date:  10/08/2020   ID:  Antonio Phillips, DOB Mar 13, 1945, MRN [id number]  PCP:  Marsh Dolly, MD   No chief complaint on file.   HPI:  [name] is a 75 year old gentleman with past medical history of PAD, bilateral amputations, permanent atrial fibrillation on anticoagulation, end-stage renal disease on hemodialysis, morbid obesity, diabetes, presenting with chest pain, elevated troponin    Reports that lives [facility], By his report has had significant lower extremity arterial disease leading to bilateral amputations Has been on dialysis since 2019, Prior evaluation by cardiology at [facility] for atrial fibrillation/SVT, no ischemic work-up at that time Echocardiogram at that time ejection fraction 55% Reports for many months has had chest discomfort sometimes quite severe, will periodically get nitroglycerin at hemodialysis for chest discomfort Is not very active secondary to amputations Has had some discomfort right side of his chest sometimes severe, seems to be escalating in frequency EMS called, given aspirin, nitro with no relief of pain, eventual relief of pain with morphine Chest CTA negative for PE or dissection Initial troponin greater than 300, trending downward to 296 Has been placed on heparin, denies any recurrence of his chest pain since that time  CATH 11/23/2020   Ost LAD to Prox LAD lesion is 50% stenosed.   2nd Mrg lesion is 99% stenosed.   A drug-eluting stent was successfully placed using a STENT RESOLUTE ONYX 2.5X15.   Post intervention, there is a 30% residual stenosis.   1.  Severe single-vessel coronary artery disease with 99% subtotal stenosis of the second obtuse marginal branch, treated with a 2.5 x 15 mm resolute Onyx DES.  30% residual stenosis at the case completion due to resistant/calcified lesion type even with high-pressure noncompliant balloon angioplasty. 2.  Mild to moderate nonobstructive proximal LAD  stenosis 3.  Mild nonobstructive RCA stenosis  ECHO  1. Left ventricular ejection fraction, by estimation, is 45 to 50%. The  left ventricle has mildly decreased function. Left ventricular endocardial  border not optimally defined to evaluate regional wall motion. There is  mild left ventricular hypertrophy.   2. Right ventricular systolic function is moderately reduced. The right  ventricular size is normal. Mildly increased right ventricular wall  thickness. There is moderately elevated pulmonary artery systolic  pressure.   3. The mitral valve is abnormal. Trivial mitral valve regurgitation.   4. The aortic valve is tricuspid. There is mild thickening of the aortic  valve.   5. The inferior vena cava is dilated in size with <50% respiratory  variability, suggesting right atrial pressure of 15 mmHg.    PMH:   has a past medical history of [place] ischemia ([place]), Diabetes ([place]), ESRD (end stage renal disease) ([place]), Hyperlipidemia, Hypotension, Morbid obesity ([place]), PAD (peripheral artery disease) ([place]), and Permanent atrial fibrillation ([place]).  PSH:    Past Surgical History:  Procedure Laterality Date   AV FISTULA REPAIR     BELOW KNEE LEG AMPUTATION Bilateral    CHOLECYSTECTOMY     LEFT HEART CATH AND CORONARY ANGIOGRAPHY N/A 09/22/2020   Procedure: LEFT HEART CATH AND CORONARY ANGIOGRAPHY;  Surgeon: Sherren Mocha, MD;  Location: [place];  Service: Cardiovascular;  Laterality: N/A;   TOTAL HIP ARTHROPLASTY Bilateral     Current Outpatient Medications  Medication Sig Dispense Refill   acetaminophen (TYLENOL) 325 MG tablet Take 2 tablets (650 mg total) by mouth every 6 (six) hours as needed for mild pain or moderate pain.  apixaban (ELIQUIS) 5 MG TABS tablet Take 1 tablet (5 mg total) by mouth 2 (two) times daily. 60 tablet 0   atorvastatin (LIPITOR) 40 MG tablet Take 40 mg by mouth at bedtime.     Carboxymethylcellulose Sod PF 0.5 % SOLN Apply 1 drop to eye 3  (three) times daily as needed (dry eyes).     carvedilol (COREG) 3.125 MG tablet Take 1 tablet (3.125 mg total) by mouth 2 (two) times daily with a meal. 60 tablet 0   clopidogrel (PLAVIX) 75 MG tablet Take 1 tablet (75 mg total) by mouth daily with breakfast. 30 tablet 0   diclofenac Sodium (VOLTAREN) 1 % GEL Apply 2 g topically daily. (Apply to right stump once every morning)     finasteride (PROSCAR) 5 MG tablet Take 5 mg by mouth daily.     HYDROcodone-acetaminophen (NORCO/VICODIN) 5-325 MG tablet Take 1 tablet by mouth every 6 (six) hours as needed for severe pain. 10 tablet 0   lactulose, encephalopathy, (CHRONULAC) 10 GM/15ML SOLN Take 10 g by mouth 2 (two) times daily as needed (mild constipation).     loperamide (IMODIUM A-D) 2 MG tablet Take 2 tablets (4 mg total) by mouth 4 (four) times daily as needed for diarrhea or loose stools. 30 tablet 0   loratadine (CLARITIN) 10 MG tablet Take 10 mg by mouth See admin instructions. Take 10 mg by mouth daily on Monday, Wednesday, Friday and Sunday     midodrine (PROAMATINE) 10 MG tablet Take 10 mg by mouth Every Tuesday,Thursday,and Saturday with dialysis.     midodrine (PROAMATINE) 5 MG tablet Take 5 mg by mouth daily.     mupirocin ointment (BACTROBAN) 2 % Place 1 application into the nose 2 (two) times daily. 22 g 0   pregabalin (LYRICA) 75 MG capsule Take 75 mg by mouth at bedtime.      rOPINIRole (REQUIP) 1 MG tablet Take 1 mg by mouth at bedtime.     sevelamer carbonate (RENVELA) 800 MG tablet Take 1,600 mg by mouth See admin instructions. Take 2 tablets ('1600mg'$ ) by mouth three times daily with meals on Monday, Wednesday, Friday and Sunday     sevelamer carbonate (RENVELA) 800 MG tablet Take 800 mg by mouth See admin instructions. Take 2 tablets ('1600mg'$ ) by mouth twice daily with meals (breakfast and dinner) on Tuesday, Thursday and Saturday     vitamin B-12 (CYANOCOBALAMIN) 1000 MCG tablet Take 1,000 mcg by mouth daily.     No current  facility-administered medications for this visit.     Allergies:   Patient has no known allergies.   Social History:  The patient  reports that he has never smoked. He has never used smokeless tobacco. He reports that he does not drink alcohol and does not use drugs.   Family History:   family history is not on file.    Review of Systems: ROS   PHYSICAL EXAM: VS:  There were no vitals taken for this visit. , BMI There is no height or weight on file to calculate BMI. GEN: Well nourished, well developed, in no acute distress HEENT: normal Neck: no JVD, carotid bruits, or masses Cardiac: RRR; no murmurs, rubs, or gallops,no edema  Respiratory:  clear to auscultation bilaterally, normal work of breathing GI: soft, nontender, nondistended, + BS MS: no deformity or atrophy Skin: warm and dry, no rash Neuro:  Strength and sensation are intact Psych: euthymic mood, full affect    Recent Labs: 01/13/2020: ALT 34 09/20/2020:  TSH 3.605 09/30/2020: BUN 31; Creatinine, Ser 4.63; Hemoglobin 9.0; Platelets 100; Potassium 4.0; Sodium 135    Lipid Panel Lab Results  Component Value Date   CHOL 58 09/23/2020   HDL 23 (L) 09/23/2020   LDLCALC 22 09/23/2020   TRIG 63 09/23/2020      Wt Readings from Last 3 Encounters:  09/26/20 216 lb 0.8 oz (98 kg)  09/22/20 218 lb 0.6 oz (98.9 kg)  01/13/20 220 lb (99.8 kg)       ASSESSMENT AND PLAN:  Problem List Items Addressed This Visit   None    Disposition:   F/U  12 months   Total encounter time more than 25 minutes  Greater than 50% was spent in counseling and coordination of care with the patient    Signed, Esmond Plants, M.D., Ph.D. [place], [place]

## 2020-10-09 ENCOUNTER — Encounter: Payer: Self-pay | Admitting: Cardiovascular Disease

## 2020-10-09 ENCOUNTER — Telehealth (INDEPENDENT_AMBULATORY_CARE_PROVIDER_SITE_OTHER): Payer: Medicare Other | Admitting: Cardiovascular Disease

## 2020-10-09 ENCOUNTER — Other Ambulatory Visit: Payer: Self-pay

## 2020-10-09 ENCOUNTER — Telehealth: Payer: Self-pay

## 2020-10-09 VITALS — BP 124/76 | HR 84 | Ht 71.0 in | Wt 215.1 lb

## 2020-10-09 DIAGNOSIS — I482 Chronic atrial fibrillation, unspecified: Secondary | ICD-10-CM | POA: Diagnosis not present

## 2020-10-09 DIAGNOSIS — I9589 Other hypotension: Secondary | ICD-10-CM

## 2020-10-09 DIAGNOSIS — I25118 Atherosclerotic heart disease of native coronary artery with other forms of angina pectoris: Secondary | ICD-10-CM

## 2020-10-09 DIAGNOSIS — I214 Non-ST elevation (NSTEMI) myocardial infarction: Secondary | ICD-10-CM

## 2020-10-09 DIAGNOSIS — I739 Peripheral vascular disease, unspecified: Secondary | ICD-10-CM | POA: Diagnosis not present

## 2020-10-09 NOTE — Telephone Encounter (Signed)
Patient Consent for Virtual Visit    Mr. Mapps, you are scheduled for a virtual visit with your provider today.  Just as we do with appointments in the office, we must obtain your consent to participate.  Your consent will be active for this visit and any virtual visit you may have with one of our providers in the next 365 days.  If you have a MyChart account, I can also send a copy of this consent to you electronically.  All virtual visits are billed to your insurance company just like a traditional visit in the office.  As this is a virtual visit, video technology does not allow for your provider to perform a traditional examination.  This may limit your provider's ability to fully assess your condition.  If your provider identifies any concerns that need to be evaluated in person or the need to arrange testing such as labs, EKG, etc, we will make arrangements to do so.  Although advances in technology are sophisticated, we cannot ensure that it will always work on either your end or our end.  If the connection with a video visit is poor, we may have to switch to a telephone visit.  With either a video or telephone visit, we are not always able to ensure that we have a secure connection.   I need to obtain your verbal consent now.   Are you willing to proceed with your visit today?        :[id number]  Rendon Atondo has provided verbal consent on 10/09/2020 for a virtual visit (video or telephone).   CONSENT FOR VIRTUAL VISIT FOR:  Antonio Phillips  By participating in this virtual visit I agree to the following:  I hereby voluntarily request, consent and authorize [facility] and its employed or contracted physicians, physician assistants, nurse practitioners or other licensed health care professionals (the Practitioner), to provide me with telemedicine health care services (the "Services") as deemed necessary by the treating Practitioner. I acknowledge and consent to receive the  Services by the Practitioner via telemedicine. I understand that the telemedicine visit will involve communicating with the Practitioner through live audiovisual communication technology and the disclosure of certain medical information by electronic transmission. I acknowledge that I have been given the opportunity to request an in-person assessment or other available alternative prior to the telemedicine visit and am voluntarily participating in the telemedicine visit.  I understand that I have the right to withhold or withdraw my consent to the use of telemedicine in the course of my care at any time, without affecting my right to future care or treatment, and that the Practitioner or I may terminate the telemedicine visit at any time. I understand that I have the right to inspect all information obtained and/or recorded in the course of the telemedicine visit and may receive copies of available information for a reasonable fee.  I understand that some of the potential risks of receiving the Services via telemedicine include:  Delay or interruption in medical evaluation due to technological equipment failure or disruption; Information transmitted may not be sufficient (e.g. poor resolution of images) to allow for appropriate medical decision making by the Practitioner; and/or  In rare instances, security protocols could fail, causing a breach of personal health information.  Furthermore, I acknowledge that it is my responsibility to provide information about my medical history, conditions and care that is complete and accurate to the best of my ability. I acknowledge that Practitioner's  advice, recommendations, and/or decision may be based on factors not within their control, such as incomplete or inaccurate data provided by me or distortions of diagnostic images or specimens that may result from electronic transmissions. I understand that the practice of medicine is not an exact science and that  Practitioner makes no warranties or guarantees regarding treatment outcomes. I acknowledge that a copy of this consent can be made available to me via my patient portal ([facility]), or I can request a printed copy by calling the office of [facility].    I understand that my insurance will be billed for this visit.   I have read or had this consent read to me. I understand the contents of this consent, which adequately explains the benefits and risks of the Services being provided via telemedicine.  I have been provided ample opportunity to ask questions regarding this consent and the Services and have had my questions answered to my satisfaction. I give my informed consent for the services to be provided through the use of telemedicine in my medical care

## 2020-10-09 NOTE — Patient Instructions (Addendum)
Medication Instructions:  No changes  If you need a refill on your cardiac medications before your next appointment, please call your pharmacy.   Lab work: No new labs needed  Testing/Procedures: No new testing needed   Follow-Up: At [facility], you and your health needs are our priority.  As part of our continuing mission to provide you with exceptional heart care, we have created designated Provider Care Teams.  These Care Teams include your primary Cardiologist (physician) and Advanced Practice Providers (APPs -  Physician Assistants and Nurse Practitioners) who all work together to provide you with the care you need, when you need it.  You will need a follow up appointment in 6 months  Providers on your designated Care Team:   Christopher Berge, NP Ryan Dunn, PA-C Jacquelyn Visser, PA-C Cadence Furth, PA-C   COVID-19 Vaccine Information can be found at: [email or web address] For questions related to vaccine distribution or appointments, please email [email or web address] or call [phone]    

## 2020-10-09 NOTE — Progress Notes (Signed)
Virtual Visit via Telephone Note   This visit type was conducted due to national recommendations for restrictions regarding the COVID-19 Pandemic (e.g. social distancing) in an effort to limit this patient's exposure and mitigate transmission in our community.  Due to his co-morbid illnesses, this patient is at least at moderate risk for complications without adequate follow up.  This format is felt to be most appropriate for this patient at this time.  The patient did not have access to video technology/had technical difficulties with video requiring transitioning to audio format only (telephone).  All issues noted in this document were discussed and addressed.  No physical exam could be performed with this format.  Please refer to the patient's chart for his  consent to telehealth for [facility].   I connected with  [name] on 10/09/20 by a video enabled telemedicine application and verified that I am speaking with the correct person using two identifiers. I am contacting the patient above from our cardiology clinic office or alternate office work station to their home, I discussed the limitations of evaluation and management by telemedicine. The patient expressed understanding and agreed to proceed.   Evaluation Performed:  Follow-up visit  Date:  10/09/2020   ID:  Antonio, Phillips 07-Jul-1945, MRN [id number]  Patient Location:  [place] [address]   Provider location:   [facility], [place] office  PCP:  Marsh Dolly, MD  Cardiologist:  Patsy [place]  Chief Complaint  Patient presents with   Follow-up    "Doing well." Medications reviewed by Chrys Racer Nurse at [facility].     History of Present Illness:    Antonio Phillips is a 75 y.o. male who presents via audio/video conferencing for a telehealth visit today.   The patient does not symptoms concerning for COVID-19 infection (fever, chills, cough, or new  SHORTNESS OF BREATH).    PMH of  PAD,  bilateral amputations,  permanent atrial fibrillation on anticoagulation,  end-stage renal disease on hemodialysis,  morbid obesity,  diabetes,  Rate and evaluated in the hospital with chest pain, elevated troponin  lives at [facility] facility, Today feels well Overall no complaints, denies any chest pain concerning for angina Prior cardiac history reviewed with him in detail By his report has had significant lower extremity arterial disease leading to bilateral amputations on dialysis since 2019, Prior evaluation by cardiology at [facility] for atrial fibrillation/SVT, no ischemic work-up at that time Echocardiogram at that time ejection fraction 55% Reports for many months has had chest discomfort sometimes quite severe, will periodically get nitroglycerin at hemodialysis for chest discomfort Is not very active secondary to amputations Has had some discomfort right side of his chest sometimes severe, seems to be escalating in frequency EMS called, given aspirin, nitro with no relief of pain, eventual relief of pain with morphine Chest CTA negative for PE or dissection Initial troponin greater than 300, trending downward to 296 Has been placed on heparin, denies any recurrence of his chest pain since that time  CATH 09/22/2020   Ost LAD to Prox LAD lesion is 50% stenosed.   2nd Mrg lesion is 99% stenosed.   A drug-eluting stent was successfully placed using a STENT RESOLUTE ONYX 2.5X15.   Post intervention, there is a 30% residual stenosis.   1.  Severe single-vessel coronary artery disease with 99% subtotal stenosis of the second obtuse marginal branch, treated with a 2.5 x  15 mm resolute Onyx DES.  30% residual stenosis at the case completion due to resistant/calcified lesion type even with high-pressure noncompliant balloon angioplasty. 2.  Mild to moderate nonobstructive proximal LAD stenosis 3.  Mild nonobstructive RCA stenosis  ECHO  09/28/20  1. Left ventricular ejection fraction, by estimation, is 45 to 50%. The  left ventricle has mildly decreased function. Left ventricular endocardial  border not optimally defined to evaluate regional wall motion. There is  mild left ventricular hypertrophy.   2. Right ventricular systolic function is moderately reduced. The right  ventricular size is normal. Mildly increased right ventricular wall  thickness. There is moderately elevated pulmonary artery systolic  pressure.   3. The mitral valve is abnormal. Trivial mitral valve regurgitation.   4. The aortic valve is tricuspid. There is mild thickening of the aortic  valve.   5. The inferior vena cava is dilated in size with <50% respiratory  variability, suggesting right atrial pressure of 15 mmHg.    Past Medical History:  Diagnosis Date   Demand ischemia ([place])    a. 11/2017 elev Trop in setting of AFib/SVT-->Echo [facility]): EF>55%. Mild LVH. Nl RV fxn.   Diabetes ([place])    a. Pt states that he has no hx of diabetes--A1c 5.5 11/2018.   ESRD (end stage renal disease) ([place])    a. On HD since 2019.   Hyperlipidemia    Hypotension    a. On midodrine.   Morbid obesity ([place])    PAD (peripheral artery disease) ([place])    a. s/p L AKA and R BKA.   Permanent atrial fibrillation ([place])    a. Dx 2019. CHA2DS2VASc = 3-4-->Eliquis.   Past Surgical History:  Procedure Laterality Date   AV FISTULA REPAIR     BELOW KNEE LEG AMPUTATION Bilateral    CHOLECYSTECTOMY     LEFT HEART CATH AND CORONARY ANGIOGRAPHY N/A 09/22/2020   Procedure: LEFT HEART CATH AND CORONARY ANGIOGRAPHY;  Surgeon: Sherren Mocha, MD;  Location: [place] [facility];  Service: Cardiovascular;  Laterality: N/A;   TOTAL HIP ARTHROPLASTY Bilateral      Allergies:   Simvastatin   Social History   Tobacco Use   Smoking status: Never   Smokeless tobacco: Never  Substance Use Topics   Alcohol use: Never   Drug use: Never     Current Outpatient Medications on File  Prior to Visit  Medication Sig Dispense Refill   acetaminophen (TYLENOL) 325 MG tablet Take 2 tablets (650 mg total) by mouth every 6 (six) hours as needed for mild pain or moderate pain.     apixaban (ELIQUIS) 5 MG TABS tablet Take 1 tablet (5 mg total) by mouth 2 (two) times daily. 60 tablet 0   atorvastatin (LIPITOR) 40 MG tablet Take 40 mg by mouth at bedtime.     Carboxymethylcellulose Sod PF 0.5 % SOLN Apply 1 drop to eye 3 (three) times daily as needed (dry eyes).     carvedilol (COREG) 3.125 MG tablet Take 1 tablet (3.125 mg total) by mouth 2 (two) times daily with a meal. 60 tablet 0   clopidogrel (PLAVIX) 75 MG tablet Take 1 tablet (75 mg total) by mouth daily with breakfast. 30 tablet 0   diclofenac Sodium (VOLTAREN) 1 % GEL Apply 2 g topically daily. (Apply to right stump once every morning)     finasteride (PROSCAR) 5 MG tablet Take 5 mg by mouth daily.     HYDROcodone-acetaminophen (NORCO/VICODIN) 5-325 MG tablet Take 1 tablet  by mouth every 6 (six) hours as needed for severe pain. 10 tablet 0   lactulose, encephalopathy, (CHRONULAC) 10 GM/15ML SOLN Take 10 g by mouth 2 (two) times daily as needed (mild constipation).     loperamide (IMODIUM A-D) 2 MG tablet Take 2 tablets (4 mg total) by mouth 4 (four) times daily as needed for diarrhea or loose stools. 30 tablet 0   loratadine (CLARITIN) 10 MG tablet Take 10 mg by mouth See admin instructions. Take 10 mg by mouth daily on Monday, Wednesday, Friday and Sunday     midodrine (PROAMATINE) 10 MG tablet Take 10 mg by mouth Every Tuesday,Thursday,and Saturday with dialysis.     midodrine (PROAMATINE) 5 MG tablet Take 5 mg by mouth daily.     mupirocin ointment (BACTROBAN) 2 % Place 1 application into the nose 2 (two) times daily. 22 g 0   pregabalin (LYRICA) 75 MG capsule Take 75 mg by mouth at bedtime.      rOPINIRole (REQUIP) 1 MG tablet Take 1 mg by mouth at bedtime.     sevelamer carbonate (RENVELA) 800 MG tablet Take 1,600 mg by  mouth See admin instructions. Take 2 tablets ('1600mg'$ ) by mouth three times daily with meals on Monday, Wednesday, Friday and Sunday     sevelamer carbonate (RENVELA) 800 MG tablet Take 800 mg by mouth See admin instructions. Take 2 tablets ('1600mg'$ ) by mouth twice daily with meals (breakfast and dinner) on Tuesday, Thursday and Saturday     vitamin B-12 (CYANOCOBALAMIN) 1000 MCG tablet Take 1,000 mcg by mouth daily.     No current facility-administered medications on file prior to visit.     Family Hx: The patient's family history is not on file.  ROS:   Please see the history of present illness.    Review of Systems  Constitutional: Negative.   HENT: Negative.    Respiratory: Negative.    Cardiovascular: Negative.   Gastrointestinal: Negative.   Musculoskeletal: Negative.   Neurological: Negative.   Psychiatric/Behavioral: Negative.    All other systems reviewed and are negative.    Labs/Other Tests and Data Reviewed:    Recent Labs: 01/13/2020: ALT 34 09/20/2020: TSH 3.605 09/30/2020: BUN 31; Creatinine, Ser 4.63; Hemoglobin 9.0; Platelets 100; Potassium 4.0; Sodium 135   Recent Lipid Panel Lab Results  Component Value Date/Time   CHOL 58 09/23/2020 04:13 AM   TRIG 63 09/23/2020 04:13 AM   HDL 23 (L) 09/23/2020 04:13 AM   CHOLHDL 2.5 09/23/2020 04:13 AM   LDLCALC 22 09/23/2020 04:13 AM    Wt Readings from Last 3 Encounters:  10/09/20 215 lb 1 oz (97.6 kg)  09/26/20 216 lb 0.8 oz (98 kg)  09/22/20 218 lb 0.6 oz (98.9 kg)     Exam:    Vital Signs: Vital signs may also be detailed in the HPI BP 124/76   Pulse 84   Ht '5\' 11"'$  (1.803 m) Comment: bilateral amputation  Wt 215 lb 1 oz (97.6 kg)   SpO2 96%   BMI 30.00 kg/m   Wt Readings from Last 3 Encounters:  10/09/20 215 lb 1 oz (97.6 kg)  09/26/20 216 lb 0.8 oz (98 kg)  09/22/20 218 lb 0.6 oz (98.9 kg)   Temp Readings from Last 3 Encounters:  09/30/20 98.1 F (36.7 C) (Oral)  09/23/20 98.1 F (36.7 C)   01/17/20 98.2 F (36.8 C) (Oral)   BP Readings from Last 3 Encounters:  10/09/20 124/76  09/30/20 95/73  09/23/20 (!) 97/53  Pulse Readings from Last 3 Encounters:  10/09/20 84  09/30/20 87  09/23/20 91     Well nourished, well developed male in no acute distress. Constitutional:  oriented to person, place, and time. No distress.    ASSESSMENT & PLAN:    Problem List Items Addressed This Visit       Cardiology Problems   Chronic hypotension   PVD (peripheral vascular disease) (HCC)   Atrial fibrillation, chronic (HCC)   NSTEMI (non-ST elevated myocardial infarction) ([place])   Other Visit Diagnoses     Coronary artery disease of native artery of native heart with stable angina pectoris ([place])    -  Primary      CAD with stable angina Currently with no symptoms of angina. No further workup at this time. Continue current medication regimen. Stay on Plavix, not on aspirin as he is on Eliquis Continue statin, beta-blocker  PAD Cholesterol at goal Continue Lipitor, Plavix, Eliquis  Atrial fibrillation, permanent Rate controlled on carvedilol, on Eliquis, no bleeding   COVID-19 Education: The signs and symptoms of COVID-19 were discussed with the patient and how to seek care for testing (follow up with PCP or arrange E-visit).  The importance of social distancing was discussed today.  Patient Risk:   After full review of this patients clinical status, I feel that they are at least moderate risk at this time.  Time:   Extensive cardiac records reviewed with him in detail today Today, I have spent 25 minutes with the patient with telehealth technology discussing the cardiac and medical problems/diagnoses detailed above   Additional 10 min spent reviewing the chart prior to patient visit today   Medication Adjustments/Labs and Tests Ordered: Current medicines are reviewed at length with the patient today.  Concerns regarding medicines are outlined above.   Tests  Ordered: No tests ordered   Medication Changes: No changes made   Signed, Ida Rogue, MD  [place] [facility] [address] [place] [address], [place], [place] [address]

## 2020-10-12 ENCOUNTER — Telehealth: Payer: Self-pay | Admitting: Cardiovascular Disease

## 2020-10-12 NOTE — Telephone Encounter (Signed)
L mom to schedule 6 month follow up appointment

## 2020-10-28 DIAGNOSIS — R252 Cramp and spasm: Secondary | ICD-10-CM | POA: Diagnosis not present

## 2020-10-28 DIAGNOSIS — I482 Chronic atrial fibrillation, unspecified: Secondary | ICD-10-CM | POA: Diagnosis not present

## 2020-10-28 DIAGNOSIS — N186 End stage renal disease: Secondary | ICD-10-CM | POA: Diagnosis not present

## 2020-10-28 DIAGNOSIS — R54 Age-related physical debility: Secondary | ICD-10-CM | POA: Diagnosis not present

## 2020-10-30 DIAGNOSIS — J449 Chronic obstructive pulmonary disease, unspecified: Secondary | ICD-10-CM | POA: Diagnosis not present

## 2020-10-30 DIAGNOSIS — N400 Enlarged prostate without lower urinary tract symptoms: Secondary | ICD-10-CM | POA: Diagnosis not present

## 2020-10-30 DIAGNOSIS — I2510 Atherosclerotic heart disease of native coronary artery without angina pectoris: Secondary | ICD-10-CM | POA: Diagnosis not present

## 2020-10-30 DIAGNOSIS — G546 Phantom limb syndrome with pain: Secondary | ICD-10-CM | POA: Diagnosis not present

## 2020-11-02 DIAGNOSIS — N186 End stage renal disease: Secondary | ICD-10-CM | POA: Diagnosis not present

## 2020-11-02 DIAGNOSIS — E0840 Diabetes mellitus due to underlying condition with diabetic neuropathy, unspecified: Secondary | ICD-10-CM | POA: Diagnosis not present

## 2020-11-02 DIAGNOSIS — I482 Chronic atrial fibrillation, unspecified: Secondary | ICD-10-CM | POA: Diagnosis not present

## 2020-11-02 DIAGNOSIS — R54 Age-related physical debility: Secondary | ICD-10-CM | POA: Diagnosis not present

## 2020-12-09 DIAGNOSIS — I482 Chronic atrial fibrillation, unspecified: Secondary | ICD-10-CM | POA: Diagnosis not present

## 2020-12-09 DIAGNOSIS — N186 End stage renal disease: Secondary | ICD-10-CM | POA: Diagnosis not present

## 2020-12-09 DIAGNOSIS — E1149 Type 2 diabetes mellitus with other diabetic neurological complication: Secondary | ICD-10-CM | POA: Diagnosis not present

## 2021-01-18 DIAGNOSIS — Z5189 Encounter for other specified aftercare: Secondary | ICD-10-CM | POA: Diagnosis not present

## 2021-01-19 DIAGNOSIS — Z89511 Acquired absence of right leg below knee: Secondary | ICD-10-CM | POA: Diagnosis not present

## 2021-01-19 DIAGNOSIS — I482 Chronic atrial fibrillation, unspecified: Secondary | ICD-10-CM | POA: Diagnosis not present

## 2021-01-19 DIAGNOSIS — Z89612 Acquired absence of left leg above knee: Secondary | ICD-10-CM | POA: Diagnosis not present

## 2021-01-19 DIAGNOSIS — S71001A Unspecified open wound, right hip, initial encounter: Secondary | ICD-10-CM | POA: Diagnosis not present

## 2021-01-19 DIAGNOSIS — I208 Other forms of angina pectoris: Secondary | ICD-10-CM | POA: Diagnosis not present

## 2021-01-21 DIAGNOSIS — N400 Enlarged prostate without lower urinary tract symptoms: Secondary | ICD-10-CM | POA: Diagnosis not present

## 2021-01-21 DIAGNOSIS — E785 Hyperlipidemia, unspecified: Secondary | ICD-10-CM | POA: Diagnosis not present

## 2021-01-21 DIAGNOSIS — S71001A Unspecified open wound, right hip, initial encounter: Secondary | ICD-10-CM | POA: Diagnosis not present

## 2021-01-26 DIAGNOSIS — Z91199 Patient's noncompliance with other medical treatment and regimen due to unspecified reason: Secondary | ICD-10-CM | POA: Diagnosis not present

## 2021-01-26 DIAGNOSIS — Z89511 Acquired absence of right leg below knee: Secondary | ICD-10-CM | POA: Diagnosis not present

## 2021-01-26 DIAGNOSIS — N186 End stage renal disease: Secondary | ICD-10-CM | POA: Diagnosis not present

## 2021-01-26 DIAGNOSIS — Z89612 Acquired absence of left leg above knee: Secondary | ICD-10-CM | POA: Diagnosis not present

## 2021-01-26 DIAGNOSIS — S71001A Unspecified open wound, right hip, initial encounter: Secondary | ICD-10-CM | POA: Diagnosis not present

## 2021-01-31 ENCOUNTER — Encounter: Payer: Self-pay | Admitting: Emergency Medicine

## 2021-01-31 ENCOUNTER — Inpatient Hospital Stay
Admission: EM | Admit: 2021-01-31 | Discharge: 2021-02-09 | DRG: 438 | Disposition: A | Discharge status: Skilled Nursing Facility | Payer: Medicare Other | Source: Skilled Nursing Facility | Attending: Internal Medicine | Admitting: Internal Medicine

## 2021-01-31 ENCOUNTER — Other Ambulatory Visit: Payer: Self-pay

## 2021-01-31 ENCOUNTER — Emergency Department: Payer: Medicare Other

## 2021-01-31 DIAGNOSIS — R0781 Pleurodynia: Secondary | ICD-10-CM

## 2021-01-31 DIAGNOSIS — J90 Pleural effusion, not elsewhere classified: Secondary | ICD-10-CM | POA: Diagnosis not present

## 2021-01-31 DIAGNOSIS — D631 Anemia in chronic kidney disease: Secondary | ICD-10-CM | POA: Diagnosis present

## 2021-01-31 DIAGNOSIS — I4821 Permanent atrial fibrillation: Secondary | ICD-10-CM | POA: Diagnosis present

## 2021-01-31 DIAGNOSIS — Z20822 Contact with and (suspected) exposure to covid-19: Secondary | ICD-10-CM | POA: Diagnosis present

## 2021-01-31 DIAGNOSIS — I255 Ischemic cardiomyopathy: Secondary | ICD-10-CM | POA: Diagnosis not present

## 2021-01-31 DIAGNOSIS — R109 Unspecified abdominal pain: Secondary | ICD-10-CM | POA: Diagnosis present

## 2021-01-31 DIAGNOSIS — Z66 Do not resuscitate: Secondary | ICD-10-CM | POA: Diagnosis present

## 2021-01-31 DIAGNOSIS — Z992 Dependence on renal dialysis: Secondary | ICD-10-CM | POA: Diagnosis not present

## 2021-01-31 DIAGNOSIS — D649 Anemia, unspecified: Secondary | ICD-10-CM | POA: Diagnosis not present

## 2021-01-31 DIAGNOSIS — L97129 Non-pressure chronic ulcer of left thigh with unspecified severity: Secondary | ICD-10-CM | POA: Diagnosis present

## 2021-01-31 DIAGNOSIS — E1122 Type 2 diabetes mellitus with diabetic chronic kidney disease: Secondary | ICD-10-CM | POA: Diagnosis present

## 2021-01-31 DIAGNOSIS — E875 Hyperkalemia: Secondary | ICD-10-CM | POA: Diagnosis not present

## 2021-01-31 DIAGNOSIS — I5022 Chronic systolic (congestive) heart failure: Secondary | ICD-10-CM | POA: Diagnosis present

## 2021-01-31 DIAGNOSIS — E1151 Type 2 diabetes mellitus with diabetic peripheral angiopathy without gangrene: Secondary | ICD-10-CM | POA: Diagnosis present

## 2021-01-31 DIAGNOSIS — R1013 Epigastric pain: Secondary | ICD-10-CM | POA: Diagnosis not present

## 2021-01-31 DIAGNOSIS — K5730 Diverticulosis of large intestine without perforation or abscess without bleeding: Secondary | ICD-10-CM | POA: Diagnosis not present

## 2021-01-31 DIAGNOSIS — Z955 Presence of coronary angioplasty implant and graft: Secondary | ICD-10-CM | POA: Diagnosis not present

## 2021-01-31 DIAGNOSIS — T148XXA Other injury of unspecified body region, initial encounter: Secondary | ICD-10-CM | POA: Diagnosis not present

## 2021-01-31 DIAGNOSIS — L89223 Pressure ulcer of left hip, stage 3: Secondary | ICD-10-CM | POA: Diagnosis present

## 2021-01-31 DIAGNOSIS — I4891 Unspecified atrial fibrillation: Secondary | ICD-10-CM

## 2021-01-31 DIAGNOSIS — Z515 Encounter for palliative care: Secondary | ICD-10-CM | POA: Diagnosis not present

## 2021-01-31 DIAGNOSIS — Z7902 Long term (current) use of antithrombotics/antiplatelets: Secondary | ICD-10-CM

## 2021-01-31 DIAGNOSIS — D696 Thrombocytopenia, unspecified: Secondary | ICD-10-CM | POA: Diagnosis not present

## 2021-01-31 DIAGNOSIS — E785 Hyperlipidemia, unspecified: Secondary | ICD-10-CM | POA: Diagnosis present

## 2021-01-31 DIAGNOSIS — R1084 Generalized abdominal pain: Secondary | ICD-10-CM | POA: Diagnosis not present

## 2021-01-31 DIAGNOSIS — Z7189 Other specified counseling: Secondary | ICD-10-CM | POA: Diagnosis not present

## 2021-01-31 DIAGNOSIS — J9811 Atelectasis: Secondary | ICD-10-CM | POA: Diagnosis present

## 2021-01-31 DIAGNOSIS — Z993 Dependence on wheelchair: Secondary | ICD-10-CM

## 2021-01-31 DIAGNOSIS — Z79899 Other long term (current) drug therapy: Secondary | ICD-10-CM

## 2021-01-31 DIAGNOSIS — Z89511 Acquired absence of right leg below knee: Secondary | ICD-10-CM | POA: Diagnosis not present

## 2021-01-31 DIAGNOSIS — I132 Hypertensive heart and chronic kidney disease with heart failure and with stage 5 chronic kidney disease, or end stage renal disease: Secondary | ICD-10-CM | POA: Diagnosis present

## 2021-01-31 DIAGNOSIS — M6281 Muscle weakness (generalized): Secondary | ICD-10-CM | POA: Diagnosis not present

## 2021-01-31 DIAGNOSIS — Z888 Allergy status to other drugs, medicaments and biological substances status: Secondary | ICD-10-CM

## 2021-01-31 DIAGNOSIS — I700 Atherosclerosis of aorta: Secondary | ICD-10-CM | POA: Diagnosis not present

## 2021-01-31 DIAGNOSIS — R52 Pain, unspecified: Secondary | ICD-10-CM

## 2021-01-31 DIAGNOSIS — N2581 Secondary hyperparathyroidism of renal origin: Secondary | ICD-10-CM | POA: Diagnosis present

## 2021-01-31 DIAGNOSIS — D693 Immune thrombocytopenic purpura: Secondary | ICD-10-CM | POA: Diagnosis present

## 2021-01-31 DIAGNOSIS — I482 Chronic atrial fibrillation, unspecified: Secondary | ICD-10-CM | POA: Diagnosis present

## 2021-01-31 DIAGNOSIS — R0902 Hypoxemia: Secondary | ICD-10-CM | POA: Diagnosis not present

## 2021-01-31 DIAGNOSIS — I959 Hypotension, unspecified: Secondary | ICD-10-CM | POA: Diagnosis not present

## 2021-01-31 DIAGNOSIS — M25531 Pain in right wrist: Secondary | ICD-10-CM | POA: Diagnosis not present

## 2021-01-31 DIAGNOSIS — I25118 Atherosclerotic heart disease of native coronary artery with other forms of angina pectoris: Secondary | ICD-10-CM | POA: Diagnosis not present

## 2021-01-31 DIAGNOSIS — R1010 Upper abdominal pain, unspecified: Secondary | ICD-10-CM

## 2021-01-31 DIAGNOSIS — M79641 Pain in right hand: Secondary | ICD-10-CM | POA: Diagnosis present

## 2021-01-31 DIAGNOSIS — K8070 Calculus of gallbladder and bile duct without cholecystitis without obstruction: Secondary | ICD-10-CM | POA: Diagnosis present

## 2021-01-31 DIAGNOSIS — Z89612 Acquired absence of left leg above knee: Secondary | ICD-10-CM

## 2021-01-31 DIAGNOSIS — K921 Melena: Secondary | ICD-10-CM | POA: Diagnosis present

## 2021-01-31 DIAGNOSIS — I214 Non-ST elevation (NSTEMI) myocardial infarction: Secondary | ICD-10-CM | POA: Diagnosis not present

## 2021-01-31 DIAGNOSIS — R1011 Right upper quadrant pain: Secondary | ICD-10-CM | POA: Diagnosis not present

## 2021-01-31 DIAGNOSIS — Z6830 Body mass index (BMI) 30.0-30.9, adult: Secondary | ICD-10-CM

## 2021-01-31 DIAGNOSIS — I517 Cardiomegaly: Secondary | ICD-10-CM | POA: Diagnosis not present

## 2021-01-31 DIAGNOSIS — Z48815 Encounter for surgical aftercare following surgery on the digestive system: Secondary | ICD-10-CM | POA: Diagnosis not present

## 2021-01-31 DIAGNOSIS — L89152 Pressure ulcer of sacral region, stage 2: Secondary | ICD-10-CM | POA: Diagnosis present

## 2021-01-31 DIAGNOSIS — N186 End stage renal disease: Secondary | ICD-10-CM | POA: Diagnosis not present

## 2021-01-31 DIAGNOSIS — K851 Biliary acute pancreatitis without necrosis or infection: Principal | ICD-10-CM | POA: Diagnosis present

## 2021-01-31 DIAGNOSIS — M898X9 Other specified disorders of bone, unspecified site: Secondary | ICD-10-CM | POA: Diagnosis present

## 2021-01-31 DIAGNOSIS — I248 Other forms of acute ischemic heart disease: Secondary | ICD-10-CM | POA: Diagnosis not present

## 2021-01-31 DIAGNOSIS — Z7401 Bed confinement status: Secondary | ICD-10-CM | POA: Diagnosis not present

## 2021-01-31 DIAGNOSIS — Z96643 Presence of artificial hip joint, bilateral: Secondary | ICD-10-CM | POA: Diagnosis present

## 2021-01-31 DIAGNOSIS — I9589 Other hypotension: Secondary | ICD-10-CM | POA: Diagnosis present

## 2021-01-31 DIAGNOSIS — I2510 Atherosclerotic heart disease of native coronary artery without angina pectoris: Secondary | ICD-10-CM | POA: Diagnosis present

## 2021-01-31 DIAGNOSIS — R571 Hypovolemic shock: Secondary | ICD-10-CM | POA: Diagnosis present

## 2021-01-31 DIAGNOSIS — Z01810 Encounter for preprocedural cardiovascular examination: Secondary | ICD-10-CM | POA: Diagnosis not present

## 2021-01-31 DIAGNOSIS — R0789 Other chest pain: Secondary | ICD-10-CM | POA: Diagnosis not present

## 2021-01-31 DIAGNOSIS — I120 Hypertensive chronic kidney disease with stage 5 chronic kidney disease or end stage renal disease: Secondary | ICD-10-CM | POA: Diagnosis not present

## 2021-01-31 DIAGNOSIS — K8050 Calculus of bile duct without cholangitis or cholecystitis without obstruction: Secondary | ICD-10-CM | POA: Diagnosis not present

## 2021-01-31 DIAGNOSIS — Z7901 Long term (current) use of anticoagulants: Secondary | ICD-10-CM

## 2021-01-31 DIAGNOSIS — I252 Old myocardial infarction: Secondary | ICD-10-CM

## 2021-01-31 DIAGNOSIS — R079 Chest pain, unspecified: Secondary | ICD-10-CM | POA: Diagnosis not present

## 2021-01-31 LAB — CBC WITH DIFFERENTIAL/PLATELET
Abs Immature Granulocytes: 0.04 10*3/uL (ref 0.00–0.07)
Basophils Absolute: 0 10*3/uL (ref 0.0–0.1)
Basophils Relative: 0 %
Eosinophils Absolute: 0 10*3/uL (ref 0.0–0.5)
Eosinophils Relative: 0 %
HCT: 26.9 % — ABNORMAL LOW (ref 39.0–52.0)
Hemoglobin: 8.5 g/dL — ABNORMAL LOW (ref 13.0–17.0)
Immature Granulocytes: 1 %
Lymphocytes Relative: 9 %
Lymphs Abs: 0.7 10*3/uL (ref 0.7–4.0)
MCH: 30.1 pg (ref 26.0–34.0)
MCHC: 31.6 g/dL (ref 30.0–36.0)
MCV: 95.4 fL (ref 80.0–100.0)
Monocytes Absolute: 0.6 10*3/uL (ref 0.1–1.0)
Monocytes Relative: 9 %
Neutro Abs: 6 10*3/uL (ref 1.7–7.7)
Neutrophils Relative %: 81 %
Platelets: 62 10*3/uL — ABNORMAL LOW (ref 150–400)
RBC: 2.82 MIL/uL — ABNORMAL LOW (ref 4.22–5.81)
RDW: 17.4 % — ABNORMAL HIGH (ref 11.5–15.5)
WBC: 7.4 10*3/uL (ref 4.0–10.5)
nRBC: 0 % (ref 0.0–0.2)

## 2021-01-31 LAB — COMPREHENSIVE METABOLIC PANEL
ALT: 44 U/L (ref 0–44)
AST: 67 U/L — ABNORMAL HIGH (ref 15–41)
Albumin: 2.9 g/dL — ABNORMAL LOW (ref 3.5–5.0)
Alkaline Phosphatase: 387 U/L — ABNORMAL HIGH (ref 38–126)
Anion gap: 17 — ABNORMAL HIGH (ref 5–15)
BUN: 89 mg/dL — ABNORMAL HIGH (ref 8–23)
CO2: 20 mmol/L — ABNORMAL LOW (ref 22–32)
Calcium: 8.2 mg/dL — ABNORMAL LOW (ref 8.9–10.3)
Chloride: 101 mmol/L (ref 98–111)
Creatinine, Ser: 9.75 mg/dL — ABNORMAL HIGH (ref 0.61–1.24)
GFR, Estimated: 5 mL/min — ABNORMAL LOW (ref >60)
Glucose, Bld: 76 mg/dL (ref 70–99)
Potassium: 5.7 mmol/L — ABNORMAL HIGH (ref 3.5–5.1)
Sodium: 138 mmol/L (ref 135–145)
Total Bilirubin: 4.2 mg/dL — ABNORMAL HIGH (ref 0.3–1.2)
Total Protein: 5.7 g/dL — ABNORMAL LOW (ref 6.5–8.1)

## 2021-01-31 LAB — RESP PANEL BY RT-PCR (FLU A&B, COVID) ARPGX2
Influenza A by PCR: NEGATIVE
Influenza B by PCR: NEGATIVE
SARS Coronavirus 2 by RT PCR: NEGATIVE

## 2021-01-31 LAB — LIPASE, BLOOD: Lipase: 1021 U/L — ABNORMAL HIGH (ref 11–51)

## 2021-01-31 LAB — POTASSIUM: Potassium: 4.8 mmol/L (ref 3.5–5.1)

## 2021-01-31 LAB — TROPONIN I (HIGH SENSITIVITY)
Troponin I (High Sensitivity): 95 ng/L — ABNORMAL HIGH (ref <18)
Troponin I (High Sensitivity): 101 ng/L (ref <18)

## 2021-01-31 LAB — BRAIN NATRIURETIC PEPTIDE: B Natriuretic Peptide: 1051.9 pg/mL — ABNORMAL HIGH (ref 0.0–100.0)

## 2021-01-31 MED ORDER — ALTEPLASE 2 MG IJ SOLR
2.0000 mg | Freq: Once | INTRAMUSCULAR | Status: DC | PRN
  Filled 2021-01-31: qty 2

## 2021-01-31 MED ORDER — LIDOCAINE-PRILOCAINE 2.5-2.5 % EX CREA
1.0000 "application " | TOPICAL_CREAM | CUTANEOUS | Status: DC | PRN
  Filled 2021-01-31: qty 5

## 2021-01-31 MED ORDER — SODIUM CHLORIDE 0.9 % IV SOLN: 100.0000 mL | INTRAVENOUS | Status: DC | PRN

## 2021-01-31 MED ORDER — PENTAFLUOROPROP-TETRAFLUOROETH EX AERO
1.0000 "application " | INHALATION_SPRAY | CUTANEOUS | Status: DC | PRN
  Filled 2021-01-31: qty 30

## 2021-01-31 MED ORDER — FAMOTIDINE IN NACL 20-0.9 MG/50ML-% IV SOLN
20.0000 mg | Freq: Once | INTRAVENOUS | Status: AC
  Administered 2021-01-31: 09:00:00 20 mg via INTRAVENOUS
  Filled 2021-01-31: qty 50

## 2021-01-31 MED ORDER — PATIROMER SORBITEX CALCIUM 8.4 G PO PACK
16.8000 g | PACK | Freq: Every day | ORAL | Status: DC
  Administered 2021-01-31 – 2021-02-01 (×2): 16.8 g via ORAL
  Filled 2021-01-31 (×3): qty 2

## 2021-01-31 MED ORDER — MORPHINE SULFATE (PF) 4 MG/ML IV SOLN
4.0000 mg | Freq: Once | INTRAVENOUS | Status: AC
  Administered 2021-01-31: 14:00:00 4 mg via INTRAVENOUS
  Filled 2021-01-31: qty 1

## 2021-01-31 MED ORDER — MORPHINE SULFATE (PF) 2 MG/ML IV SOLN
2.0000 mg | INTRAVENOUS | Status: DC | PRN
  Administered 2021-01-31: 2 mg via INTRAVENOUS
  Filled 2021-01-31: qty 1

## 2021-01-31 MED ORDER — CHLORHEXIDINE GLUCONATE CLOTH 2 % EX PADS
6.0000 | MEDICATED_PAD | Freq: Every day | CUTANEOUS | Status: DC
  Filled 2021-01-31 (×2): qty 6

## 2021-01-31 MED ORDER — HEPARIN SODIUM (PORCINE) 1000 UNIT/ML DIALYSIS
1000.0000 [IU] | INTRAMUSCULAR | Status: DC | PRN
  Filled 2021-01-31 (×2): qty 1

## 2021-01-31 MED ORDER — LIDOCAINE HCL (PF) 1 % IJ SOLN
5.0000 mL | INTRAMUSCULAR | Status: DC | PRN
  Filled 2021-01-31: qty 5

## 2021-01-31 MED ORDER — ONDANSETRON HCL 4 MG/2ML IJ SOLN
4.0000 mg | Freq: Once | INTRAMUSCULAR | Status: AC
  Administered 2021-01-31: 09:00:00 4 mg via INTRAVENOUS
  Filled 2021-01-31: qty 2

## 2021-01-31 MED ORDER — ALUM & MAG HYDROXIDE-SIMETH 200-200-20 MG/5ML PO SUSP
30.0000 mL | Freq: Once | ORAL | Status: AC
  Administered 2021-01-31: 09:00:00 30 mL via ORAL
  Filled 2021-01-31: qty 30

## 2021-01-31 MED ORDER — SODIUM CHLORIDE 0.9 % IV SOLN: INTRAVENOUS | Status: DC

## 2021-01-31 MED ORDER — MORPHINE SULFATE (PF) 4 MG/ML IV SOLN
4.0000 mg | Freq: Once | INTRAVENOUS | Status: AC
  Administered 2021-01-31: 09:00:00 4 mg via INTRAVENOUS
  Filled 2021-01-31: qty 1

## 2021-01-31 MED ORDER — LIDOCAINE VISCOUS HCL 2 % MT SOLN
15.0000 mL | Freq: Once | OROMUCOSAL | Status: AC
  Administered 2021-01-31: 09:00:00 15 mL via ORAL
  Filled 2021-01-31: qty 15

## 2021-01-31 MED ORDER — SODIUM CHLORIDE 0.9 % IV BOLUS
500.0000 mL | Freq: Once | INTRAVENOUS | Status: AC
  Administered 2021-01-31: 09:00:00 500 mL via INTRAVENOUS

## 2021-01-31 MED ORDER — ONDANSETRON HCL 4 MG/2ML IJ SOLN
4.0000 mg | Freq: Once | INTRAMUSCULAR | Status: AC
  Administered 2021-01-31: 14:00:00 4 mg via INTRAVENOUS
  Filled 2021-01-31: qty 2

## 2021-01-31 MED ORDER — MIDODRINE HCL 5 MG PO TABS
10.0000 mg | ORAL_TABLET | Freq: Once | ORAL | Status: AC
  Administered 2021-01-31: 19:00:00 10 mg via ORAL
  Filled 2021-01-31: qty 2

## 2021-01-31 NOTE — ED Provider Notes (Signed)
[facility] Emergency Department Provider Note ____________________________________________   Event Date/Time   First MD Initiated Contact with Patient 01/31/21 1235     (approximate)  I have reviewed the triage vital signs and the nursing notes.  HISTORY  Chief Complaint Abdominal Pain, Chest Pain, and Emesis   HPI Antonio Phillips is a 75 y.o. malewho presents to the ED for evaluation of thoracoabdominal pain and emesis.  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  ESRD on hemodialysis, T,Th,S, last dialyzed on Wednesday.  Atrial fibrillation .  Eliquis and Plavix.  Mildly reduced EF.  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  Patient presents to the ED for evaluation of 2-3 days of abdominal pain and emesis.  Reports decreased bowel movements and denies diarrhea.  Reports epigastric periumbilical and lower chest pain that is severe and up to 10/10 intensity with associated nausea and emesis.  Nonbloody nonbilious emesis.  Missed dialysis this weekend due to pain.  Last went on Wednesday.  Denies fevers, shortness of breath, syncope  Past Medical History:  Diagnosis Date   Demand ischemia ([place])    a. 11/2017 elev Trop in setting of AFib/SVT-->Echo [facility]): EF>55%. Mild LVH. Nl RV fxn.   Diabetes ([place])    a. Pt states that he has no hx of diabetes--A1c 5.5 11/2018.   ESRD (end stage renal disease) ([place])    a. On HD since 2019.   Hyperlipidemia    Hypotension    a. On midodrine.   Morbid obesity ([place])    PAD (peripheral artery disease) ([place])    a. s/p L AKA and R BKA.   Permanent atrial fibrillation ([place])    a. Dx 2019. CHA2DS2VASc = 3-4-->Eliquis.    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[place])    Anemia of chronic disease    Thrombocytopenia ([place])    Sacral wound 01/13/2020   Pressure injury of skin 01/13/2020   Generalized weakness 01/13/2020   ESRD (end stage renal disease) ([place]) 04/25/2018    Past Surgical History:  Procedure Laterality Date   AV FISTULA REPAIR     BELOW KNEE LEG AMPUTATION Bilateral    CHOLECYSTECTOMY     LEFT HEART CATH AND CORONARY ANGIOGRAPHY N/A 09/22/2020   Procedure: LEFT HEART CATH AND CORONARY ANGIOGRAPHY;  Surgeon: Sherren Mocha, MD;  Location: [place] [facility];  Service: Cardiovascular;  Laterality: N/A;   TOTAL HIP ARTHROPLASTY Bilateral     Prior to Admission medications   Medication Sig Start Date End Date Taking? Authorizing Provider  acetaminophen (TYLENOL) 325 MG tablet Take 2 tablets (650 mg total) by mouth every 6 (six) hours as needed for mild pain or moderate pain. 01/17/20   Loletha Grayer, MD  apixaban (ELIQUIS) 5 MG TABS tablet Take 1 tablet (5 mg total) by mouth 2 (two) times daily. 01/17/20   Loletha Grayer, MD  atorvastatin (LIPITOR) 40 MG tablet Take 40 mg by mouth at bedtime.  09/17/20   [provider]  Carboxymethylcellulose Sod PF 0.5 % SOLN Apply 1 drop to eye 3 (three) times daily as needed (dry eyes). 07/28/20   [provider]  carvedilol (COREG) 3.125 MG tablet Take 1 tablet (3.125 mg total) by mouth 2 (two) times daily with a meal. 01/17/20   Loletha Grayer, MD  clopidogrel (PLAVIX) 75 MG tablet Take 1 tablet (75 mg total) by mouth daily with breakfast. 09/23/20   Loletha Grayer, MD  diclofenac Sodium (VOLTAREN) 1 % GEL Apply 2 g topically daily. (Apply to right stump once every morning) 09/08/20   [provider]  finasteride (PROSCAR) 5 MG tablet Take 5 mg by mouth  daily. 03/13/19   [provider]  HYDROcodone-acetaminophen (NORCO/VICODIN) 5-325 MG tablet Take 1 tablet by mouth every 6 (six) hours as needed for severe pain. 09/23/20   Loletha Grayer, MD  lactulose, encephalopathy, (CHRONULAC) 10 GM/15ML SOLN Take 10 g by mouth 2 (two) times daily as needed (mild constipation).    [provider]  loperamide (IMODIUM A-D) 2 MG tablet Take 2 tablets (4 mg total) by mouth 4 (four) times daily as needed for diarrhea or loose stools. 09/09/19   Carrie Mew, MD  loratadine (CLARITIN) 10 MG tablet Take 10 mg by mouth See admin instructions. Take 10 mg by mouth daily on Monday, Wednesday, Friday and Sunday    [provider]  midodrine (PROAMATINE) 10 MG tablet Take 10 mg by mouth Every Tuesday,Thursday,and Saturday with dialysis.    [provider]  midodrine (PROAMATINE) 5 MG tablet Take 5 mg by mouth daily.    [provider]  mupirocin ointment (BACTROBAN) 2 % Place 1 application into the nose 2 (two) times daily. 09/23/20   Loletha Grayer, MD  pregabalin (LYRICA) 75 MG capsule Take 75 mg by mouth at bedtime.  11/02/17   [provider]  rOPINIRole (REQUIP) 1 MG tablet Take 1 mg by mouth at bedtime. 03/13/19   [provider]  sevelamer carbonate (RENVELA) 800 MG tablet Take 1,600 mg by mouth See admin instructions. Take 2 tablets (1600mg ) by mouth three times daily with meals on Monday, Wednesday, Friday and Sunday    [provider]  sevelamer carbonate (RENVELA) 800 MG tablet Take 800 mg by mouth See admin instructions. Take 2 tablets (1600mg ) by mouth twice daily with meals (breakfast and dinner) on Tuesday, Thursday and Saturday    [provider]  vitamin B-12 (CYANOCOBALAMIN) 1000 MCG tablet Take 1,000 mcg by mouth daily.    [provider]    Allergies Simvastatin  No family history on file.  Social History Social History   Tobacco Use   Smoking status: Never    Smokeless tobacco: Never  Substance Use Topics   Alcohol use: Never   Drug use: Never    Review of Systems  Constitutional: No fever/chills Eyes: No visual changes. ENT: No sore throat. Cardiovascular: Denies chest pain. Respiratory: Denies shortness of breath. Gastrointestinal: Positive for abdominal pain, nausea and vomiting. No diarrhea.  No constipation. Genitourinary: Negative for dysuria. Musculoskeletal: Negative for back pain. Skin: Negative for rash. Neurological: Negative for headaches, focal weakness or numbness.  ____________________________________________   PHYSICAL EXAM:  VITAL SIGNS: Vitals:   01/31/21 1330 01/31/21 1400  BP: 97/67 (!) 88/58  Pulse: 99 (!) 104  Resp: 15 12  Temp:    SpO2: 95% 91%    Constitutional: Alert and oriented.  Chronically ill-appearing. Eyes: Conjunctivae are normal. PERRL. EOMI. Head:  Atraumatic. Nose: No congestion/rhinnorhea. Mouth/Throat: Mucous membranes are moist.  Oropharynx non-erythematous. Neck: No stridor. No cervical spine tenderness to palpation. Cardiovascular: Normal rate, regular rhythm. Grossly normal heart sounds.  Good peripheral circulation. Respiratory: Normal respiratory effort.  No retractions. Lungs CTAB. Gastrointestinal: Soft , nondistended,  Epigastric and periumbilical tenderness.  Benign lower abdomen. Musculoskeletal: No lower extremity tenderness nor edema.  No joint effusions. No signs of acute trauma. Neurologic:  Normal speech and language. No gross focal neurologic deficits are appreciated. No gait instability noted. Skin:  Skin is warm, dry and intact. No rash noted. Psychiatric: Mood and affect are normal. Speech and behavior are normal. ____________________________________________   LABS (all labs ordered are listed, but only abnormal results are displayed)  Labs Reviewed  COMPREHENSIVE METABOLIC PANEL - Abnormal; Notable for the following components:      Result Value    Potassium 5.7 (*)    CO2 20 (*)    BUN 89 (*)    Creatinine, Ser 9.75 (*)    Calcium 8.2 (*)    Total Protein 5.7 (*)    Albumin 2.9 (*)    AST 67 (*)    Alkaline Phosphatase 387 (*)    Total Bilirubin 4.2 (*)    GFR, Estimated 5 (*)    Anion gap 17 (*)    All other components within normal limits  BRAIN NATRIURETIC PEPTIDE - Abnormal; Notable for the following components:   B Natriuretic Peptide 1,051.9 (*)    All other components within normal limits  CBC WITH DIFFERENTIAL/PLATELET - Abnormal; Notable for the following components:   RBC 2.82 (*)    Hemoglobin 8.5 (*)    HCT 26.9 (*)    RDW 17.4 (*)    Platelets 62 (*)    All other components within normal limits  TROPONIN I (HIGH SENSITIVITY) - Abnormal; Notable for the following components:   Troponin I (High Sensitivity) 95 (*)    All other components within normal limits  TROPONIN I (HIGH SENSITIVITY) - Abnormal; Notable for the following components:   Troponin I (High Sensitivity) 101 (*)    All other components within normal limits  RESP PANEL BY RT-PCR (FLU A&B, COVID) ARPGX2  URINALYSIS, ROUTINE W REFLEX MICROSCOPIC  LIPASE, BLOOD   ____________________________________________  12 Lead EKG  Sinus rhythm with unusual P axis, rate of 110 bpm.  Appropriate intervals.  No STEMI. ____________________________________________  RADIOLOGY  ED MD interpretation: 1 view CXR reviewed by me with cardiomegaly and pulmonary edema  Official radiology report(s): DG Chest Port 1 View  Result Date: 01/31/2021 CLINICAL DATA:  Chest pain. EXAM: PORTABLE CHEST 1 VIEW COMPARISON:  Chest radiograph 09/26/2020; CT chest 09/20/2020 FINDINGS: Of note, patient is mildly rotated on this portable exam. Low lung volumes with confluent opacity throughout the left lung. Diffuse hazy interstitial thickening in the right lung without focal consolidation. No pneumothorax. Heart size is difficult to assess due to adjacent consolidations. Aortic  calcifications. No mediastinal shift accounting for patient positioning. No acute osseous abnormality. IMPRESSION: Pulmonary edema with confluent opacity in the left lung favored to represent combination of at least moderate pleural effusion with compressive atelectasis, though infection is not excluded. Electronically Signed   By: Ileana Roup M.D.   On: 01/31/2021 09:41   CT CHEST ABDOMEN PELVIS WO CONTRAST  Result Date: 01/31/2021 CLINICAL DATA:  Chest and abdominal pain EXAM: CT CHEST, ABDOMEN AND PELVIS WITHOUT CONTRAST TECHNIQUE: Multidetector CT imaging of the chest, abdomen and pelvis was performed following the  standard protocol without IV contrast. COMPARISON:  CT chest 09/20/2020 FINDINGS: CT CHEST FINDINGS Cardiovascular: Heart is enlarged. Small pericardial effusion. Extensive coronary artery calcifications. Mildly dilated pulmonary artery measuring 3.1 cm diameter. Thoracic aorta is tortuous and normal caliber with moderate calcified plaques. Mediastinum/Nodes: Previous right thyroidectomy changes. No bulky lymphadenopathy identified. Mildly enlarged right pretracheal lymph node measuring 11 mm. Lungs/Pleura: Trace bilateral pleural effusions right greater than left. No focal consolidation identified in the lungs. No pneumothorax. Musculoskeletal: No chest wall mass or suspicious bone lesions identified. CT ABDOMEN PELVIS FINDINGS Hepatobiliary: Liver is upper normal size with no suspicious mass identified. Gallbladder is mildly distended and contains dependent calculi. 1.4 cm round density in the distal common bile duct consistent with choledocholithiasis. The common bile duct is dilated measuring 1.8 cm in diameter. Mild intrahepatic ductal dilatation. Fat stranding in the right upper quadrant. Pancreas: Mild pancreatic and peripancreatic inflammatory fat stranding changes centered at the pancreatic head. No mass or ductal dilatation appreciated. Spleen: Spleen is enlarged measuring 16 cm in  length. Adrenals/Urinary Tract: Adrenal glands appear normal. Kidneys are atrophic with multiple cysts bilaterally measuring up to 4 cm on the left. No hydronephrosis visualized. Urinary bladder incompletely visualized with appearance of mild wall thickening. Stomach/Bowel: No bowel obstruction, free air or pneumatosis. Mild wall thickening of the duodenum, likely reactive from adjacent inflammatory changes. Colonic diverticulosis. Appendix is normal. Vascular/Lymphatic: Severe calcified atherosclerotic plaques. No bulky lymphadenopathy identified. Multiple small retroperitoneal and upper abdominal lymph nodes are noted which are nonspecific. Reproductive: Prostate gland not well visualized. Other: Trace ascites. Musculoskeletal: Degenerative changes of the spine. Bilateral hip prosthesis. No suspicious bony lesions identified. IMPRESSION: 1. Evidence of acute pancreatitis. 2. Choledocholithiasis including a 1.4 cm calculus in the distal common bile duct. Biliary ductal dilatation suggesting obstruction. Consider ERCP. 3. Cholelithiasis with pericholecystic edema which may represent acute cholecystitis or reactive changes. 4. Colonic diverticulosis. 5. Splenomegaly. 6. Cardiomegaly with small pericardial effusion. 7. Small pleural effusions. 8. Coronary artery disease and atherosclerotic disease. Evidence of pulmonary artery hypertension. 9. Additional chronic findings as described. Electronically Signed   By: Ofilia Neas M.D.   On: 01/31/2021 13:58    ____________________________________________   PROCEDURES and INTERVENTIONS  Procedure(s) performed (including Critical Care):  .1-3 Lead EKG Interpretation Performed by: Vladimir Crofts, MD Authorized by: Vladimir Crofts, MD     Interpretation: abnormal     ECG rate:  100   ECG rate assessment: tachycardic     Rhythm: sinus tachycardia     Ectopy: none     Conduction: normal   .Critical Care Performed by: Vladimir Crofts, MD Authorized by: Vladimir Crofts, MD   Critical care provider statement:    Critical care time (minutes):  30   Critical care was necessary to treat or prevent imminent or life-threatening deterioration of the following conditions:  Metabolic crisis and hepatic failure   Critical care was time spent personally by me on the following activities:  Development of treatment plan with patient or surrogate, discussions with consultants, evaluation of patient's response to treatment, examination of patient, ordering and review of laboratory studies, ordering and review of radiographic studies, ordering and performing treatments and interventions, pulse oximetry, re-evaluation of patient's condition and review of old charts  Medications  patiromer Daryll Drown) packet 16.8 g (16.8 g Oral Given 01/31/21 1200)  0.9 %  sodium chloride infusion ( Intravenous New Bag/Given 01/31/21 1335)  sodium chloride 0.9 % bolus 500 mL (0 mLs Intravenous Stopped 01/31/21 1028)  alum & mag  hydroxide-simeth (MAALOX/MYLANTA) 200-200-20 MG/5ML suspension 30 mL (30 mLs Oral Given 01/31/21 0924)    And  lidocaine (XYLOCAINE) 2 % viscous mouth solution 15 mL (15 mLs Oral Given 01/31/21 0925)  famotidine (PEPCID) IVPB 20 mg premix (0 mg Intravenous Stopped 01/31/21 1028)  morphine 4 MG/ML injection 4 mg (4 mg Intravenous Given 01/31/21 0925)  ondansetron (ZOFRAN) injection 4 mg (4 mg Intravenous Given 01/31/21 0925)  morphine 4 MG/ML injection 4 mg (4 mg Intravenous Given 01/31/21 1331)  ondansetron (ZOFRAN) injection 4 mg (4 mg Intravenous Given 01/31/21 1330)    ____________________________________________   MDM / ED COURSE   Chronically ill vasculopath presents to the ED with acute abdominal pain and evidence of choledocholithiasis.  Troponins are marginally elevated, but flat and EKG is nonischemic.  I doubt ACS.  Furthers evidence of pulmonary edema and elevated BNP, but no respiratory distress or hypoxia.  No increased work of breathing  significantly.  Mild hyperkalemia that we will manage medically until he can get dialyzed.  LFTs and bilirubin are quite elevated.  CT chest abdomen pelvis obtained due to his CXR findings, chest pain and abdominal symptoms with these LFTs.  Does have evidence of acute choledocholithiasis.  Lipase is pending and I suspect pancreatitis as well.  We will initiate gentle fluids due to his CHF, provide analgesia and admit to medicine.  GI and nephrology consulted by me in the ED.  Clinical Course as of 01/31/21 1435  Sun Jan 31, 2021  1328 reassessed [DS]  1410 Gi paged [DS]  1411 I discuss with Dr. Vicente Males. Dr. Allen Norris will be available tomorrow so pt may stay here [DS]  1430 I discuss with Dr. Kyung Bacca, who agrees to admit [DS]  1430 I discuss with Dr. Holley Raring, will see for dialysis [DS]    Clinical Course User Index [DS] Vladimir Crofts, MD    ____________________________________________   FINAL CLINICAL IMPRESSION(S) / ED DIAGNOSES  Final diagnoses:  Pain  Choledocholithiasis     ED Discharge Orders     None        Dorris Vangorder Tamala Julian   Note:  This document was prepared using Dragon voice recognition software and may include unintentional dictation errors.    Vladimir Crofts, MD 01/31/21 [phone]

## 2021-01-31 NOTE — Progress Notes (Signed)
[facility]  ROUNDING NOTE   Subjective:   Antonio Phillips is a 75 year old male with past medical history including hypotension, obesity, peripheral artery disease, diabetes, hyperlipidemia, and end-stage renal disease on dialysis.  Patient presents to emergency department with abdominal pain persisting for 2 days.  Patient admitted for further evaluation for Choledocholithiasis [K80.50]  Patient is known to our practice for previous admissions and receives outpatient dialysis at [facility], supervised by Dr. Candiss Norse.  He receives dialysis on a Tuesday Thursday Saturday schedule.  He states the pain began a couple days ago and upper to mid abdomen accompanied by nausea and vomiting.  Patient does report chest pain for 1 day prior to arrival.  Patient has a history of hypotension at baseline.  Currently denies nausea.  Denies shortness of breath, baseline oxygen requirement of 4 L.  Patient currently on room air satting mid 90s.  He states he was unable to attend his scheduled dialysis treatment due to increased pain and sickness.  Chest x-ray shows pulmonary edema.  Lab work indicates sodium 138, potassium 5.7, creatinine 9.75 with GFR 5, lipase greater than 1000.  Elevated BNP and troponin noted.  We have been consulted to manage dialysis needs.   Objective:  Vital signs in last 24 hours:  Temp:  [97.4 F (36.3 C)-97.9 F (36.6 C)] 97.4 F (36.3 C) (11/27 1848) Pulse Rate:  [92-117] 97 (11/27 1848) Resp:  [9-18] 11 (11/27 1848) BP: (75-108)/(50-70) 103/70 (11/27 1848) SpO2:  [91 %-97 %] 96 % (11/27 1848) Weight:  [97.6 kg] 97.6 kg (11/27 0904)  Weight change:  Filed Weights   01/31/21 0904  Weight: 97.6 kg    Intake/Output: No intake/output data recorded.   Intake/Output this shift:  No intake/output data recorded.  Physical Exam: General: NAD, resting comfortably  Head: Normocephalic, atraumatic. Moist oral mucosal membranes  Eyes: Anicteric  Neck:  Supple, trachea midline  Lungs:  Clear to auscultation, normal effort, room air  Heart: Regular rate and rhythm  Abdomen:  Soft, tender, mild distention  Extremities: No peripheral edema.  Neurologic: Nonfocal, moving all four extremities  Skin: No lesions  Access: Left aVF    Basic Metabolic Panel: Recent Labs  Lab 01/31/21 0909  NA 138  K 5.7*  CL 101  CO2 20*  GLUCOSE 76  BUN 89*  CREATININE 9.75*  CALCIUM 8.2*    Liver Function Tests: Recent Labs  Lab 01/31/21 0909  AST 67*  ALT 44  ALKPHOS 387*  BILITOT 4.2*  PROT 5.7*  ALBUMIN 2.9*   Recent Labs  Lab 01/31/21 0909  LIPASE 1,021*   No results for input(s): AMMONIA in the last 168 hours.  CBC: Recent Labs  Lab 01/31/21 0909  WBC 7.4  NEUTROABS 6.0  HGB 8.5*  HCT 26.9*  MCV 95.4  PLT 62*    Cardiac Enzymes: No results for input(s): CKTOTAL, CKMB, CKMBINDEX, TROPONINI in the last 168 hours.  BNP: Invalid input(s): POCBNP  CBG: No results for input(s): GLUCAP in the last 168 hours.  Microbiology: Results for orders placed or performed during the hospital encounter of 01/31/21  Resp Panel by RT-PCR (Flu A&B, Covid) Nasopharyngeal Swab     Status: None   Collection Time: 01/31/21  9:11 AM   Specimen: Nasopharyngeal Swab; Nasopharyngeal(NP) swabs in vial transport medium  Result Value Ref Range Status   SARS Coronavirus 2 by RT PCR NEGATIVE NEGATIVE Final    Comment: (NOTE) SARS-CoV-2 target nucleic acids are NOT DETECTED.  The  SARS-CoV-2 RNA is generally detectable in upper respiratory specimens during the acute phase of infection. The lowest concentration of SARS-CoV-2 viral copies this assay can detect is 138 copies/mL. A negative result does not preclude SARS-Cov-2 infection and should not be used as the sole basis for treatment or other patient management decisions. A negative result may occur with  improper specimen collection/handling, submission of specimen other than  nasopharyngeal swab, presence of viral mutation(s) within the areas targeted by this assay, and inadequate number of viral copies(<138 copies/mL). A negative result must be combined with clinical observations, patient history, and epidemiological information. The expected result is Negative.  Fact Sheet for Patients:  EntrepreneurPulse.com.au  Fact Sheet for Healthcare Providers:  IncredibleEmployment.be  This test is no t yet approved or cleared by the [place] FDA and  has been authorized for detection and/or diagnosis of SARS-CoV-2 by FDA under an Emergency Use Authorization (EUA). This EUA will remain  in effect (meaning this test can be used) for the duration of the COVID-19 declaration under Section 564(b)(1) of the Act, 21 U.S.C.section 360bbb-3(b)(1), unless the authorization is terminated  or revoked sooner.       Influenza A by PCR NEGATIVE NEGATIVE Final   Influenza B by PCR NEGATIVE NEGATIVE Final    Comment: (NOTE) The Xpert Xpress SARS-CoV-2/FLU/RSV plus assay is intended as an aid in the diagnosis of influenza from Nasopharyngeal swab specimens and should not be used as a sole basis for treatment. Nasal washings and aspirates are unacceptable for Xpert Xpress SARS-CoV-2/FLU/RSV testing.  Fact Sheet for Patients: EntrepreneurPulse.com.au  Fact Sheet for Healthcare Providers: IncredibleEmployment.be  This test is not yet approved or cleared by the [place] FDA and has been authorized for detection and/or diagnosis of SARS-CoV-2 by FDA under an Emergency Use Authorization (EUA). This EUA will remain in effect (meaning this test can be used) for the duration of the COVID-19 declaration under Section 564(b)(1) of the Act, 21 U.S.C. section 360bbb-3(b)(1), unless the authorization is terminated or revoked.  Performed at [facility], [address] [place], [place]  [address]     Coagulation Studies: No results for input(s): LABPROT, INR in the last 72 hours.  Urinalysis: No results for input(s): COLORURINE, LABSPEC, PHURINE, GLUCOSEU, HGBUR, BILIRUBINUR, KETONESUR, PROTEINUR, UROBILINOGEN, NITRITE, LEUKOCYTESUR in the last 72 hours.  Invalid input(s): APPERANCEUR    Imaging: DG Chest Port 1 View  Result Date: 01/31/2021 CLINICAL DATA:  Chest pain. EXAM: PORTABLE CHEST 1 VIEW COMPARISON:  Chest radiograph 09/26/2020; CT chest 09/20/2020 FINDINGS: Of note, patient is mildly rotated on this portable exam. Low lung volumes with confluent opacity throughout the left lung. Diffuse hazy interstitial thickening in the right lung without focal consolidation. No pneumothorax. Heart size is difficult to assess due to adjacent consolidations. Aortic calcifications. No mediastinal shift accounting for patient positioning. No acute osseous abnormality. IMPRESSION: Pulmonary edema with confluent opacity in the left lung favored to represent combination of at least moderate pleural effusion with compressive atelectasis, though infection is not excluded. Electronically Signed   By: Ileana Roup M.D.   On: 01/31/2021 09:41   CT CHEST ABDOMEN PELVIS WO CONTRAST  Result Date: 01/31/2021 CLINICAL DATA:  Chest and abdominal pain EXAM: CT CHEST, ABDOMEN AND PELVIS WITHOUT CONTRAST TECHNIQUE: Multidetector CT imaging of the chest, abdomen and pelvis was performed following the standard protocol without IV contrast. COMPARISON:  CT chest 09/20/2020 FINDINGS: CT CHEST FINDINGS Cardiovascular: Heart is enlarged. Small pericardial effusion. Extensive coronary artery calcifications. Mildly dilated  pulmonary artery measuring 3.1 cm diameter. Thoracic aorta is tortuous and normal caliber with moderate calcified plaques. Mediastinum/Nodes: Previous right thyroidectomy changes. No bulky lymphadenopathy identified. Mildly enlarged right pretracheal lymph node measuring 11 mm. Lungs/Pleura:  Trace bilateral pleural effusions right greater than left. No focal consolidation identified in the lungs. No pneumothorax. Musculoskeletal: No chest wall mass or suspicious bone lesions identified. CT ABDOMEN PELVIS FINDINGS Hepatobiliary: Liver is upper normal size with no suspicious mass identified. Gallbladder is mildly distended and contains dependent calculi. 1.4 cm round density in the distal common bile duct consistent with choledocholithiasis. The common bile duct is dilated measuring 1.8 cm in diameter. Mild intrahepatic ductal dilatation. Fat stranding in the right upper quadrant. Pancreas: Mild pancreatic and peripancreatic inflammatory fat stranding changes centered at the pancreatic head. No mass or ductal dilatation appreciated. Spleen: Spleen is enlarged measuring 16 cm in length. Adrenals/Urinary Tract: Adrenal glands appear normal. Kidneys are atrophic with multiple cysts bilaterally measuring up to 4 cm on the left. No hydronephrosis visualized. Urinary bladder incompletely visualized with appearance of mild wall thickening. Stomach/Bowel: No bowel obstruction, free air or pneumatosis. Mild wall thickening of the duodenum, likely reactive from adjacent inflammatory changes. Colonic diverticulosis. Appendix is normal. Vascular/Lymphatic: Severe calcified atherosclerotic plaques. No bulky lymphadenopathy identified. Multiple small retroperitoneal and upper abdominal lymph nodes are noted which are nonspecific. Reproductive: Prostate gland not well visualized. Other: Trace ascites. Musculoskeletal: Degenerative changes of the spine. Bilateral hip prosthesis. No suspicious bony lesions identified. IMPRESSION: 1. Evidence of acute pancreatitis. 2. Choledocholithiasis including a 1.4 cm calculus in the distal common bile duct. Biliary ductal dilatation suggesting obstruction. Consider ERCP. 3. Cholelithiasis with pericholecystic edema which may represent acute cholecystitis or reactive changes. 4.  Colonic diverticulosis. 5. Splenomegaly. 6. Cardiomegaly with small pericardial effusion. 7. Small pleural effusions. 8. Coronary artery disease and atherosclerotic disease. Evidence of pulmonary artery hypertension. 9. Additional chronic findings as described. Electronically Signed   By: Ofilia Neas M.D.   On: 01/31/2021 13:58     Medications:    sodium chloride 50 mL/hr at 01/31/21 1335    [START ON 02/01/2021] Chlorhexidine Gluconate Cloth  6 each Topical Q0600   patiromer  16.8 g Oral Daily     Assessment/ Plan:  Mr. SHAMAN MUSCARELLA is a 75 y.o.  male with past medical history including hypotension, obesity, peripheral artery disease, diabetes, hyperlipidemia, and end-stage renal disease on dialysis.  Patient presents to emergency department with abdominal pain persisting for 2 days.  Patient admitted for further evaluation for Choledocholithiasis [K80.50]  [facility]/TTS/Lt AVF   End-stage renal disease on dialysis.  Will maintain outpatient schedule, if possible.  Due to reported missed 2 treatments, will dialyze patient today.  We will decrease blood flow rate and maintain MAP greater than 60.  Patient also prescribed midodrine 10 mg p.o. prior to treatment.  Chart review indicates patient takes midodrine 10 mg on dialysis days with a decreased dose of 5 mg on nondialysis days.  We will determine if additional treatment is needed tomorrow.  2. Anemia of chronic kidney disease Lab Results  Component Value Date   HGB 8.5 (L) 01/31/2021    Hemoglobin not at goal, will prescribe low-dose EPO with treatments  3. Secondary Hyperparathyroidism: Lab Results  Component Value Date   CALCIUM 8.2 (L) 01/31/2021   PHOS 3.7 09/27/2020    We will continue to monitor bone minerals during this admission Continue sevelamer with meals     LOS: 0 Maguire Killmer  Onis Markoff 11/27/20227:19 PM

## 2021-01-31 NOTE — ED Notes (Signed)
Pt transported to dialysis. Consent signed.

## 2021-01-31 NOTE — ED Provider Notes (Signed)
Emergency Medicine Provider Triage Evaluation Note  Antonio Phillips , a 75 y.o. male  was evaluated in triage.  Pt complains of midepigastric abdominal pain, chest pain, and emesis.  Patient states that this emesis has been going on for the last 2 days and developed midepigastric and substernal chest pain 1 day prior to arrival.  Patient is end-stage renal disease on dialysis but refused to go for the last 2 appointments due to nausea/vomiting.  EMS found patient to be hypotensive with systolics in the 48G and was given 300 cc of IV crystalloid with improvement of systolics to the 500B.  Patient still in sinus tachycardia at approximately 110 and satting on percent on 4 L which patient wears chronically.  Review of Systems  Positive: Abdominal pain, chest pain, nausea, vomiting Negative: Melena, hematemesis, syncope  Physical Exam  Pulse (!) 110   Resp 18   Ht 5\' 11"  (1.803 m)   Wt 97.6 kg   BMI 30.01 kg/m  Gen:   Awake, mild distress secondary to pain Resp:  Normal effort  MSK:   Moves extremities without difficulty Other:  Tender to palpation in midepigastric and right upper quadrant abdomen  Medical Decision Making  Medically screening exam initiated at 9:06 AM.  Appropriate orders placed.  [name] was informed that the remainder of the evaluation will be completed by another provider, this initial triage assessment does not replace that evaluation, and the importance of remaining in the ED until their evaluation is complete.    Naaman Plummer, MD 01/31/21 [phone]

## 2021-01-31 NOTE — ED Triage Notes (Signed)
Pt to ED via [facility] from [facility] for abd pain, chest pain, and emesis. Emesis has been going on x 2 days and chest pain x 1 day. Pt was supposed to go to dialysis today but was unable to go because of nausea and vomiting. Pt hypotensive with EMS with systolic in the 09'N. Pt was given 300 cc of fluid with EMS with BP improving to 103/66, Heart rate of 111. Pt SpO2 100% 4 liters- pt is supposed to wear oxygen but non compliant with wearing. Pts initial room air sats were 88%. Pt was able to be titrated back down to room air with sats at 100%.    Pt is also c/o chronic right hand pain. EDP at bedside. Pt is in NAD.

## 2021-01-31 NOTE — H&P (Signed)
History and Physical  Antonio Phillips MPN:[id number] DOB: [birth year] DOA: 01/31/2021  Referring physician: Dr. D.Smith PCP: Marsh Dolly, MD  Outpatient Specialists:   Patient coming from: [place] home & is  not able to ambulate   Chief Complaint: Abdominal pain  HPI: Antonio Phillips is a 75 y.o. male with medical history significant for 75 year old male with past medical history significant for hyperlipidemia,  hypotension, morbid obesity, peripheral atrial disease, diabetes mellitus, A. fib on Eliquis, end-stage renal disease on hemodialysis since 2019 Patient presented with 2-day history of abdominal pain that was in the upper abdomen and mid abdomen midepigastric associated with nausea and vomiting.  Patient also had associated substernal chest pain 1 day prior to arrival.  When EMS arrived he was noted to be hypertensive with systolic blood pressure in the 80s he was given 300 cc of normal saline with improvement to diastolic of 008 he was also found to be tachycardic he is chronically on oxygen at 4 L/min with normal oxygenation.  Patient had missed 2 of his hemodialysis appointment because of the pain.  ED Course: Patient was evaluated in the ED he had CT scan of the abdomen that showed choledocholithiasis.  He also was noted to have elevated lipase level.  His BNP was also elevated as well as a troponin EKG did not show any ischemia.  Review of Systems: Pt complains of abdominal pain nausea vomiting  Pt denies any diarrhea fever review of systems are otherwise negative   Past Medical History:  Diagnosis Date   Demand ischemia ([place])    a. 11/2017 elev Trop in setting of AFib/SVT-->Echo [facility]): EF>55%. Mild LVH. Nl RV fxn.   Diabetes ([place])    a. Pt states that he has no hx of diabetes--A1c 5.5 11/2018.   ESRD (end stage renal disease) ([place])    a. On HD since 2019.   Hyperlipidemia    Hypotension    a. On midodrine.   Morbid obesity ([place])    PAD (peripheral  artery disease) ([place])    a. s/p L AKA and R BKA.   Permanent atrial fibrillation ([place])    a. Dx 2019. CHA2DS2VASc = 3-4-->Eliquis.   Past Surgical History:  Procedure Laterality Date   AV FISTULA REPAIR     BELOW KNEE LEG AMPUTATION Bilateral    CHOLECYSTECTOMY     LEFT HEART CATH AND CORONARY ANGIOGRAPHY N/A 09/22/2020   Procedure: LEFT HEART CATH AND CORONARY ANGIOGRAPHY;  Surgeon: Sherren Mocha, MD;  Location: [facility];  Service: Cardiovascular;  Laterality: N/A;   TOTAL HIP ARTHROPLASTY Bilateral     Social History:  reports that he has never smoked. He has never used smokeless tobacco. He reports that he does not drink alcohol and does not use drugs.   Allergies  Allergen Reactions   Simvastatin Other (See Comments)    No family history on file.    Prior to Admission medications   Medication Sig Start Date End Date Taking? Authorizing Provider  acetaminophen (TYLENOL) 325 MG tablet Take 2 tablets (650 mg total) by mouth every 6 (six) hours as needed for mild pain or moderate pain. 01/17/20  Yes Wieting, Richard, MD  apixaban (ELIQUIS) 5 MG TABS tablet Take 1 tablet (5 mg total) by mouth 2 (two) times daily. 01/17/20  Yes Wieting, Richard, MD  atorvastatin (LIPITOR) 40 MG tablet Take 40 mg by mouth at bedtime. 09/17/20  Yes [provider]  Carboxymethylcellulose Sod PF 0.5 % SOLN Place  1 drop into both eyes 3 (three) times daily as needed (dry eyes).   Yes [provider]  carvedilol (COREG) 3.125 MG tablet Take 1 tablet (3.125 mg total) by mouth 2 (two) times daily with a meal. 01/17/20  Yes Wieting, Richard, MD  ciprofloxacin (CIPRO) 500 MG tablet Take 500 mg by mouth 2 (two) times daily. 01/21/21 02/03/21 Yes [provider]  clopidogrel (PLAVIX) 75 MG tablet Take 1 tablet (75 mg total) by mouth daily with breakfast. 09/23/20  Yes Wieting, Richard, MD  clotrimazole (LOTRIMIN) 1 % cream Apply 1 application topically 2 (two) times daily.  (Apply under foreskin of penis) 01/27/21 02/10/21 Yes [provider]  diclofenac Sodium (VOLTAREN) 1 % GEL Apply 2 g topically in the morning. (Apply to right stump)   Yes [provider]  doxycycline (VIBRAMYCIN) 100 MG capsule Take 100 mg by mouth 2 (two) times daily. 01/20/21 02/02/21 Yes [provider]  finasteride (PROSCAR) 5 MG tablet Take 5 mg by mouth daily. 03/13/19  Yes [provider]  HYDROcodone-acetaminophen (NORCO/VICODIN) 5-325 MG tablet Take 1 tablet by mouth every 6 (six) hours as needed for severe pain. 09/23/20  Yes Wieting, Richard, MD  lactulose, encephalopathy, (CHRONULAC) 10 GM/15ML SOLN Take 10 g by mouth 2 (two) times daily as needed (mild constipation).   Yes [provider]  loperamide (IMODIUM A-D) 2 MG tablet Take 2 tablets (4 mg total) by mouth 4 (four) times daily as needed for diarrhea or loose stools. 09/09/19  Yes Carrie Mew, MD  loratadine (CLARITIN) 10 MG tablet Take 10 mg by mouth See admin instructions. Take 10 mg by mouth daily on Monday, Wednesday, Friday and Sunday   Yes [provider]  midodrine (PROAMATINE) 10 MG tablet Take 10 mg by mouth Every Tuesday,Thursday,and Saturday with dialysis.   Yes [provider]  midodrine (PROAMATINE) 5 MG tablet Take 5 mg by mouth daily at 6 (six) AM.   Yes [provider]  pregabalin (LYRICA) 75 MG capsule Take 75 mg by mouth 2 (two) times daily.   Yes [provider]  rOPINIRole (REQUIP) 1 MG tablet Take 1 mg by mouth at bedtime. 03/13/19  Yes [provider]  sevelamer carbonate (RENVELA) 800 MG tablet Take 1,600 mg by mouth See admin instructions. Take 2 tablets (1600mg ) by mouth three times daily with meals on Monday, Wednesday, Friday and Sunday   Yes [provider]  sevelamer carbonate (RENVELA) 800 MG tablet Take 800 mg by mouth See admin instructions. Take 2 tablets (1600mg ) by mouth twice daily with meals (breakfast  and dinner) on Tuesday, Thursday and Saturday   Yes [provider]  sodium hypochlorite (DAKIN'S 1/4 STRENGTH) 0.125 % SOLN Apply 1 application topically See admin instructions. Use as directed after cleansing left stump wound - use twice daily and as needed 01/15/21  Yes [provider]  vitamin B-12 (CYANOCOBALAMIN) 1000 MCG tablet Take 1,000 mcg by mouth daily.   Yes [provider]  mupirocin ointment (BACTROBAN) 2 % Place 1 application into the nose 2 (two) times daily. Patient not taking: Reported on 01/31/2021 09/23/20   Loletha Grayer, MD    Physical Exam: BP (!) 88/70   Pulse (!) 117   Temp 97.9 F (36.6 C) (Oral)   Resp 17   Ht 5\' 11"  (1.803 m)   Wt 97.6 kg   SpO2 94%   BMI 30.01 kg/m   Exam:  General: 75 y.o. year-old male well developed well nourished  in no acute distress.  Alert and oriented x3.  Obese Cardiovascular: Regular rate and rhythm with no rubs or gallops.  No thyromegaly or JVD noted.   Respiratory: Clear to auscultation with no wheezes or rales. Good inspiratory effort. Abdomen: Soft ,tender nondistended with normal bowel sounds x4 quadrants. Musculoskeletal: Right below-knee amputation, left above-knee amputation Skin: Left stump ulcer Psychiatry: Mood is appropriate for condition and setting           Labs on Admission:  Basic Metabolic Panel: Recent Labs  Lab 01/31/21 0909  NA 138  K 5.7*  CL 101  CO2 20*  GLUCOSE 76  BUN 89*  CREATININE 9.75*  CALCIUM 8.2*   Liver Function Tests: Recent Labs  Lab 01/31/21 0909  AST 67*  ALT 44  ALKPHOS 387*  BILITOT 4.2*  PROT 5.7*  ALBUMIN 2.9*   Recent Labs  Lab 01/31/21 0909  LIPASE 1,021*   No results for input(s): AMMONIA in the last 168 hours. CBC: Recent Labs  Lab 01/31/21 0909  WBC 7.4  NEUTROABS 6.0  HGB 8.5*  HCT 26.9*  MCV 95.4  PLT 62*   Cardiac Enzymes: No results for input(s): CKTOTAL, CKMB, CKMBINDEX, TROPONINI in the last 168  hours.  BNP (last 3 results) Recent Labs    01/31/21 0909  BNP 1,051.9*    ProBNP (last 3 results) No results for input(s): PROBNP in the last 8760 hours.  CBG: No results for input(s): GLUCAP in the last 168 hours.  Radiological Exams on Admission: DG Chest Port 1 View  Result Date: 01/31/2021 CLINICAL DATA:  Chest pain. EXAM: PORTABLE CHEST 1 VIEW COMPARISON:  Chest radiograph 09/26/2020; CT chest 09/20/2020 FINDINGS: Of note, patient is mildly rotated on this portable exam. Low lung volumes with confluent opacity throughout the left lung. Diffuse hazy interstitial thickening in the right lung without focal consolidation. No pneumothorax. Heart size is difficult to assess due to adjacent consolidations. Aortic calcifications. No mediastinal shift accounting for patient positioning. No acute osseous abnormality. IMPRESSION: Pulmonary edema with confluent opacity in the left lung favored to represent combination of at least moderate pleural effusion with compressive atelectasis, though infection is not excluded. Electronically Signed   By: Ileana Roup M.D.   On: 01/31/2021 09:41   CT CHEST ABDOMEN PELVIS WO CONTRAST  Result Date: 01/31/2021 CLINICAL DATA:  Chest and abdominal pain EXAM: CT CHEST, ABDOMEN AND PELVIS WITHOUT CONTRAST TECHNIQUE: Multidetector CT imaging of the chest, abdomen and pelvis was performed following the standard protocol without IV contrast. COMPARISON:  CT chest 09/20/2020 FINDINGS: CT CHEST FINDINGS Cardiovascular: Heart is enlarged. Small pericardial effusion. Extensive coronary artery calcifications. Mildly dilated pulmonary artery measuring 3.1 cm diameter. Thoracic aorta is tortuous and normal caliber with moderate calcified plaques. Mediastinum/Nodes: Previous right thyroidectomy changes. No bulky lymphadenopathy identified. Mildly enlarged right pretracheal lymph node measuring 11 mm. Lungs/Pleura: Trace bilateral pleural effusions right greater than left. No  focal consolidation identified in the lungs. No pneumothorax. Musculoskeletal: No chest wall mass or suspicious bone lesions identified. CT ABDOMEN PELVIS FINDINGS Hepatobiliary: Liver is upper normal size with no suspicious mass identified. Gallbladder is mildly distended and contains dependent calculi. 1.4 cm round density in the distal common bile duct consistent with choledocholithiasis. The common bile duct is dilated measuring 1.8 cm in diameter. Mild intrahepatic ductal dilatation. Fat stranding in the right upper quadrant. Pancreas: Mild pancreatic and peripancreatic inflammatory fat stranding changes centered at the pancreatic head. No mass or ductal dilatation appreciated. Spleen: Spleen is  enlarged measuring 16 cm in length. Adrenals/Urinary Tract: Adrenal glands appear normal. Kidneys are atrophic with multiple cysts bilaterally measuring up to 4 cm on the left. No hydronephrosis visualized. Urinary bladder incompletely visualized with appearance of mild wall thickening. Stomach/Bowel: No bowel obstruction, free air or pneumatosis. Mild wall thickening of the duodenum, likely reactive from adjacent inflammatory changes. Colonic diverticulosis. Appendix is normal. Vascular/Lymphatic: Severe calcified atherosclerotic plaques. No bulky lymphadenopathy identified. Multiple small retroperitoneal and upper abdominal lymph nodes are noted which are nonspecific. Reproductive: Prostate gland not well visualized. Other: Trace ascites. Musculoskeletal: Degenerative changes of the spine. Bilateral hip prosthesis. No suspicious bony lesions identified. IMPRESSION: 1. Evidence of acute pancreatitis. 2. Choledocholithiasis including a 1.4 cm calculus in the distal common bile duct. Biliary ductal dilatation suggesting obstruction. Consider ERCP. 3. Cholelithiasis with pericholecystic edema which may represent acute cholecystitis or reactive changes. 4. Colonic diverticulosis. 5. Splenomegaly. 6. Cardiomegaly with small  pericardial effusion. 7. Small pleural effusions. 8. Coronary artery disease and atherosclerotic disease. Evidence of pulmonary artery hypertension. 9. Additional chronic findings as described. Electronically Signed   By: Ofilia Neas M.D.   On: 01/31/2021 13:58    EKG: Independently reviewed.  Nonspecific T wave changes no ischemia  Assessment/Plan Present on Admission:  ESRD (end stage renal disease) ([place])  Abdominal pain  Choledocholithiasis  Atrial fibrillation, chronic (H[facility])  Morbid obesity ([place])  Wound of skin  Principal Problem:   Abdominal pain Active Problems:   ESRD (end stage renal disease) (HCC)   Atrial fibrillation, chronic (HCC)   Wound of skin   Choledocholithiasis   Morbid obesity ([place])  Abdominal pain: 2 days duration.   He is lipase is elevated  CT scan showed evidence of acute pancreatitis, as well as cholelithiasis including a 1.4 cm calculus in the distal common bile duct, biliary ductal dilatation suggestive of obstruction. EDP consulted with Dr. Verl Blalock who will do ERCP  End-stage renal disease: Patient is on hemodialysis but he missed his hemodialysis due to abdominal pain.  EDP consulted with nephrology for possible hemodialysis patient is however hemodynamically stable his potassium is slightly elevated at 5.7 and there is no EKG changes  CHF with elevated BNP of 1059. Will continue on Lasix.  Atrial fibrillation patient is currently rate controlled Continue Eliquis  Hypotension.  Patient is on midodrine he received IV fluid in the emergency department. Will continue on low-dose IV fluid given his history of CHF  Type 2 diabetes mellitus continue home dose of insulin and sliding scale    Severity of Illness: The appropriate patient status for this patient is INPATIENT. Inpatient status is judged to be reasonable and necessary in order to provide the required intensity of service to ensure the patient's safety. The patient's presenting symptoms,  physical exam findings, and initial radiographic and laboratory data in the context of their chronic comorbidities is felt to place them at high risk for further clinical deterioration. Furthermore, it is not anticipated that the patient will be medically stable for discharge from the hospital within 2 midnights of admission.  Patient with abdominal pain and choledocholithiasis needing ERCP and possible surgery * I certify that at the point of admission it is my clinical judgment that the patient will require inpatient hospital care spanning beyond 2 midnights from the point of admission due to high intensity of service, high risk for further deterioration and high frequency of surveillance required.*   DVT prophylaxis: Eliquis  Code Status: Full  Family Communication: None at bedside  Disposition  Plan: Likely discharge back to [facility] called: EDP consulted with GI for ERCP and nephrology for possible hemodialysis  Admission status: Inpatient    Cristal Deer MD [facility] Pager [phone]  If 7PM-7AM, please contact night-coverage [email or web address] Password [email or web address]  01/31/2021, 4:11 PM

## 2021-02-01 DIAGNOSIS — Z01810 Encounter for preprocedural cardiovascular examination: Secondary | ICD-10-CM | POA: Diagnosis not present

## 2021-02-01 DIAGNOSIS — R1010 Upper abdominal pain, unspecified: Secondary | ICD-10-CM | POA: Diagnosis not present

## 2021-02-01 DIAGNOSIS — I482 Chronic atrial fibrillation, unspecified: Secondary | ICD-10-CM

## 2021-02-01 DIAGNOSIS — K851 Biliary acute pancreatitis without necrosis or infection: Secondary | ICD-10-CM | POA: Diagnosis not present

## 2021-02-01 DIAGNOSIS — K8050 Calculus of bile duct without cholangitis or cholecystitis without obstruction: Secondary | ICD-10-CM | POA: Diagnosis not present

## 2021-02-01 LAB — HEPATIC FUNCTION PANEL
ALT: 42 U/L (ref 0–44)
AST: 51 U/L — ABNORMAL HIGH (ref 15–41)
Albumin: 2.8 g/dL — ABNORMAL LOW (ref 3.5–5.0)
Alkaline Phosphatase: 332 U/L — ABNORMAL HIGH (ref 38–126)
Bilirubin, Direct: 1.7 mg/dL — ABNORMAL HIGH (ref 0.0–0.2)
Indirect Bilirubin: 1.6 mg/dL — ABNORMAL HIGH (ref 0.3–0.9)
Total Bilirubin: 3.3 mg/dL — ABNORMAL HIGH (ref 0.3–1.2)
Total Protein: 5.2 g/dL — ABNORMAL LOW (ref 6.5–8.1)

## 2021-02-01 LAB — CBC
HCT: 23.7 % — ABNORMAL LOW (ref 39.0–52.0)
Hemoglobin: 7.5 g/dL — ABNORMAL LOW (ref 13.0–17.0)
MCH: 30.4 pg (ref 26.0–34.0)
MCHC: 31.6 g/dL (ref 30.0–36.0)
MCV: 96 fL (ref 80.0–100.0)
Platelets: 48 10*3/uL — ABNORMAL LOW (ref 150–400)
RBC: 2.47 MIL/uL — ABNORMAL LOW (ref 4.22–5.81)
RDW: 17.3 % — ABNORMAL HIGH (ref 11.5–15.5)
WBC: 5.1 10*3/uL (ref 4.0–10.5)
nRBC: 0 % (ref 0.0–0.2)

## 2021-02-01 LAB — BASIC METABOLIC PANEL
Anion gap: 13 (ref 5–15)
BUN: 77 mg/dL — ABNORMAL HIGH (ref 8–23)
CO2: 23 mmol/L (ref 22–32)
Calcium: 8.4 mg/dL — ABNORMAL LOW (ref 8.9–10.3)
Chloride: 100 mmol/L (ref 98–111)
Creatinine, Ser: 8.12 mg/dL — ABNORMAL HIGH (ref 0.61–1.24)
GFR, Estimated: 6 mL/min — ABNORMAL LOW (ref >60)
Glucose, Bld: 78 mg/dL (ref 70–99)
Potassium: 5.8 mmol/L — ABNORMAL HIGH (ref 3.5–5.1)
Sodium: 136 mmol/L (ref 135–145)

## 2021-02-01 LAB — PHOSPHORUS: Phosphorus: 8 mg/dL — ABNORMAL HIGH (ref 2.5–4.6)

## 2021-02-01 LAB — HEPATITIS B SURFACE ANTIBODY,QUALITATIVE: Hep B S Ab: NONREACTIVE

## 2021-02-01 LAB — HEPATITIS B SURFACE ANTIGEN: Hepatitis B Surface Ag: NONREACTIVE

## 2021-02-01 MED ORDER — DICLOFENAC SODIUM 1 % EX GEL
2.0000 g | Freq: Every morning | CUTANEOUS | Status: DC
  Administered 2021-02-01 – 2021-02-09 (×8): 2 g via TOPICAL
  Filled 2021-02-01 (×2): qty 100

## 2021-02-01 MED ORDER — ALBUMIN HUMAN 25 % IV SOLN
INTRAVENOUS | Status: AC
  Administered 2021-02-01: 17:00:00 25 g
  Filled 2021-02-01: qty 100

## 2021-02-01 MED ORDER — ROPINIROLE HCL 1 MG PO TABS
1.0000 mg | ORAL_TABLET | Freq: Every day | ORAL | Status: DC
  Administered 2021-02-01 – 2021-02-08 (×7): 1 mg via ORAL
  Filled 2021-02-01 (×9): qty 1

## 2021-02-01 MED ORDER — LACTULOSE 10 GM/15ML PO SOLN
10.0000 g | Freq: Two times a day (BID) | ORAL | Status: DC | PRN
Start: 2021-02-01 — End: 2021-02-10

## 2021-02-01 MED ORDER — CLOPIDOGREL BISULFATE 75 MG PO TABS: 75.0000 mg | ORAL_TABLET | Freq: Every day | ORAL | Status: DC

## 2021-02-01 MED ORDER — MIDODRINE HCL 5 MG PO TABS
5.0000 mg | ORAL_TABLET | Freq: Every day | ORAL | Status: DC
  Administered 2021-02-01 – 2021-02-04 (×3): 5 mg via ORAL
  Filled 2021-02-01 (×5): qty 1

## 2021-02-01 MED ORDER — LORATADINE 10 MG PO TABS
10.0000 mg | ORAL_TABLET | ORAL | Status: DC
  Administered 2021-02-01 – 2021-02-08 (×4): 10 mg via ORAL
  Filled 2021-02-01 (×3): qty 1

## 2021-02-01 MED ORDER — CLOTRIMAZOLE 1 % EX CREA
1.0000 "application " | TOPICAL_CREAM | Freq: Two times a day (BID) | CUTANEOUS | Status: DC
  Administered 2021-02-02 – 2021-02-08 (×10): 1 via TOPICAL
  Filled 2021-02-01 (×3): qty 15

## 2021-02-01 MED ORDER — PREGABALIN 75 MG PO CAPS
75.0000 mg | ORAL_CAPSULE | Freq: Two times a day (BID) | ORAL | Status: DC
  Administered 2021-02-01 – 2021-02-06 (×8): 75 mg via ORAL
  Filled 2021-02-01 (×9): qty 1

## 2021-02-01 MED ORDER — MIDODRINE HCL 5 MG PO TABS
10.0000 mg | ORAL_TABLET | ORAL | Status: DC
  Administered 2021-02-02: 10 mg via ORAL
  Filled 2021-02-01: qty 2

## 2021-02-01 MED ORDER — APIXABAN 5 MG PO TABS: 5.0000 mg | ORAL_TABLET | Freq: Two times a day (BID) | ORAL | Status: DC

## 2021-02-01 MED ORDER — PIPERACILLIN-TAZOBACTAM IN DEX 2-0.25 GM/50ML IV SOLN
2.2500 g | Freq: Three times a day (TID) | INTRAVENOUS | Status: DC
Start: 2021-02-01 — End: 2021-02-01
  Filled 2021-02-01 (×3): qty 50

## 2021-02-01 MED ORDER — POLYVINYL ALCOHOL 1.4 % OP SOLN
1.0000 [drp] | Freq: Three times a day (TID) | OPHTHALMIC | Status: DC | PRN
  Filled 2021-02-01: qty 15

## 2021-02-01 MED ORDER — ASPIRIN EC 81 MG PO TBEC
81.0000 mg | DELAYED_RELEASE_TABLET | Freq: Every day | ORAL | Status: DC
  Administered 2021-02-01 – 2021-02-05 (×3): 81 mg via ORAL
  Filled 2021-02-01 (×4): qty 1

## 2021-02-01 MED ORDER — HYDROCODONE-ACETAMINOPHEN 5-325 MG PO TABS
1.0000 | ORAL_TABLET | Freq: Four times a day (QID) | ORAL | Status: DC | PRN
  Administered 2021-02-02 – 2021-02-09 (×6): 1 via ORAL
  Filled 2021-02-01 (×7): qty 1

## 2021-02-01 MED ORDER — METOPROLOL TARTRATE 5 MG/5ML IV SOLN: 5.0000 mg | INTRAVENOUS | Status: AC | PRN

## 2021-02-01 MED ORDER — SEVELAMER CARBONATE 800 MG PO TABS
1600.0000 mg | ORAL_TABLET | ORAL | Status: DC
  Administered 2021-02-04 – 2021-02-09 (×3): 1600 mg via ORAL
  Filled 2021-02-01 (×6): qty 2

## 2021-02-01 MED ORDER — LOPERAMIDE HCL 2 MG PO CAPS: 4.0000 mg | ORAL_CAPSULE | Freq: Four times a day (QID) | ORAL | Status: DC | PRN

## 2021-02-01 MED ORDER — DAKINS (1/4 STRENGTH) 0.125 % EX SOLN
1.0000 "application " | Freq: Two times a day (BID) | CUTANEOUS | Status: AC | PRN
  Filled 2021-02-01: qty 473

## 2021-02-01 MED ORDER — ACETAMINOPHEN 325 MG PO TABS
650.0000 mg | ORAL_TABLET | Freq: Four times a day (QID) | ORAL | Status: DC | PRN
  Administered 2021-02-06: 650 mg via ORAL
  Filled 2021-02-01 (×2): qty 2

## 2021-02-01 MED ORDER — ATORVASTATIN CALCIUM 20 MG PO TABS
40.0000 mg | ORAL_TABLET | Freq: Every day | ORAL | Status: DC
  Administered 2021-02-01 – 2021-02-08 (×7): 40 mg via ORAL
  Filled 2021-02-01 (×7): qty 2

## 2021-02-01 MED ORDER — METOPROLOL TARTRATE 5 MG/5ML IV SOLN
INTRAVENOUS | Status: AC
  Filled 2021-02-01: qty 5

## 2021-02-01 MED ORDER — FINASTERIDE 5 MG PO TABS
5.0000 mg | ORAL_TABLET | Freq: Every day | ORAL | Status: DC
  Administered 2021-02-01 – 2021-02-09 (×7): 5 mg via ORAL
  Filled 2021-02-01 (×8): qty 1

## 2021-02-01 MED ORDER — VITAMIN B-12 1000 MCG PO TABS
1000.0000 ug | ORAL_TABLET | Freq: Every day | ORAL | Status: DC
  Administered 2021-02-01 – 2021-02-09 (×7): 1000 ug via ORAL
  Filled 2021-02-01 (×8): qty 1

## 2021-02-01 NOTE — ED Notes (Signed)
Report given to dialysis at this time. 

## 2021-02-01 NOTE — ED Notes (Signed)
Message sent to Maren Beach, MD regarding patient's BP of 85/57.

## 2021-02-01 NOTE — ED Notes (Signed)
Wound nurse at bedside.

## 2021-02-01 NOTE — ED Notes (Signed)
Pt's gown and bedding changed, and pt positioned to R side with pillows.

## 2021-02-01 NOTE — ED Notes (Signed)
Patient repositioned in bed at this time. NAD noted.

## 2021-02-01 NOTE — ED Notes (Signed)
Patient eating lunch tray at this time °

## 2021-02-01 NOTE — Progress Notes (Signed)
[facility]  ROUNDING NOTE   Subjective:   Antonio Phillips is a 75 year old male with past medical history including hypotension, obesity, peripheral artery disease, diabetes, hyperlipidemia, and end-stage renal disease on dialysis.  Patient presents to emergency department with abdominal pain persisting for 2 days.  Patient admitted for further evaluation for Choledocholithiasis [K80.50]  Patient is known to our practice for previous admissions and receives outpatient dialysis at [facility], supervised by Dr. Candiss Norse.  He receives dialysis on a Tuesday Thursday Saturday schedule.    Patient seen laying in bed, alert and oriented Tolerating meals without nausea and vomiting Denies shortness of breath, currently on room air Denies chest pain or discomfort   Objective:  Vital signs in last 24 hours:  Temp:  [97.4 F (36.3 C)-98.5 F (36.9 C)] 98.5 F (36.9 C) (11/28 1615) Pulse Rate:  [49-127] 120 (11/28 1715) Resp:  [11-23] 18 (11/28 1645) BP: (80-120)/(55-82) 88/67 (11/28 1715) SpO2:  [91 %-100 %] 94 % (11/28 1615)  Weight change:  Filed Weights   01/31/21 0904  Weight: 97.6 kg    Intake/Output: I/O last 3 completed shifts: In: -  Out: -93    Intake/Output this shift:  No intake/output data recorded.  Physical Exam: General: NAD, resting comfortably  Head: Normocephalic, atraumatic. Moist oral mucosal membranes  Eyes: Anicteric  Neck: Supple, trachea midline  Lungs:  Clear to auscultation, normal effort, room air  Heart: Regular rate and rhythm  Abdomen:  Soft, tender, mild distention  Extremities: No peripheral edema.  Right BKA left AKA  Neurologic: Nonfocal, moving all four extremities  Skin: No lesions  Access: Left aVF    Basic Metabolic Panel: Recent Labs  Lab 01/31/21 0909 01/31/21 2030 02/01/21 0618  NA 138  --  136  K 5.7* 4.8 5.8*  CL 101  --  100  CO2 20*  --  23  GLUCOSE 76  --  78  BUN 89*  --  77*  CREATININE 9.75*   --  8.12*  CALCIUM 8.2*  --  8.4*     Liver Function Tests: Recent Labs  Lab 01/31/21 0909 02/01/21 0618  AST 67* 51*  ALT 44 42  ALKPHOS 387* 332*  BILITOT 4.2* 3.3*  PROT 5.7* 5.2*  ALBUMIN 2.9* 2.8*    Recent Labs  Lab 01/31/21 0909  LIPASE 1,021*    No results for input(s): AMMONIA in the last 168 hours.  CBC: Recent Labs  Lab 01/31/21 0909 02/01/21 0618  WBC 7.4 5.1  NEUTROABS 6.0  --   HGB 8.5* 7.5*  HCT 26.9* 23.7*  MCV 95.4 96.0  PLT 62* 48*     Cardiac Enzymes: No results for input(s): CKTOTAL, CKMB, CKMBINDEX, TROPONINI in the last 168 hours.  BNP: Invalid input(s): POCBNP  CBG: No results for input(s): GLUCAP in the last 168 hours.  Microbiology: Results for orders placed or performed during the hospital encounter of 01/31/21  Resp Panel by RT-PCR (Flu A&B, Covid) Nasopharyngeal Swab     Status: None   Collection Time: 01/31/21  9:11 AM   Specimen: Nasopharyngeal Swab; Nasopharyngeal(NP) swabs in vial transport medium  Result Value Ref Range Status   SARS Coronavirus 2 by RT PCR NEGATIVE NEGATIVE Final    Comment: (NOTE) SARS-CoV-2 target nucleic acids are NOT DETECTED.  The SARS-CoV-2 RNA is generally detectable in upper respiratory specimens during the acute phase of infection. The lowest concentration of SARS-CoV-2 viral copies this assay can detect is 138 copies/mL. A negative  result does not preclude SARS-Cov-2 infection and should not be used as the sole basis for treatment or other patient management decisions. A negative result may occur with  improper specimen collection/handling, submission of specimen other than nasopharyngeal swab, presence of viral mutation(s) within the areas targeted by this assay, and inadequate number of viral copies(<138 copies/mL). A negative result must be combined with clinical observations, patient history, and epidemiological information. The expected result is Negative.  Fact Sheet for  Patients:  EntrepreneurPulse.com.au  Fact Sheet for Healthcare Providers:  IncredibleEmployment.be  This test is no t yet approved or cleared by the [place] FDA and  has been authorized for detection and/or diagnosis of SARS-CoV-2 by FDA under an Emergency Use Authorization (EUA). This EUA will remain  in effect (meaning this test can be used) for the duration of the COVID-19 declaration under Section 564(b)(1) of the Act, 21 U.S.C.section 360bbb-3(b)(1), unless the authorization is terminated  or revoked sooner.       Influenza A by PCR NEGATIVE NEGATIVE Final   Influenza B by PCR NEGATIVE NEGATIVE Final    Comment: (NOTE) The Xpert Xpress SARS-CoV-2/FLU/RSV plus assay is intended as an aid in the diagnosis of influenza from Nasopharyngeal swab specimens and should not be used as a sole basis for treatment. Nasal washings and aspirates are unacceptable for Xpert Xpress SARS-CoV-2/FLU/RSV testing.  Fact Sheet for Patients: EntrepreneurPulse.com.au  Fact Sheet for Healthcare Providers: IncredibleEmployment.be  This test is not yet approved or cleared by the [place] FDA and has been authorized for detection and/or diagnosis of SARS-CoV-2 by FDA under an Emergency Use Authorization (EUA). This EUA will remain in effect (meaning this test can be used) for the duration of the COVID-19 declaration under Section 564(b)(1) of the Act, 21 U.S.C. section 360bbb-3(b)(1), unless the authorization is terminated or revoked.  Performed at [facility], [address] [place], [place] [address]     Coagulation Studies: No results for input(s): LABPROT, INR in the last 72 hours.  Urinalysis: No results for input(s): COLORURINE, LABSPEC, PHURINE, GLUCOSEU, HGBUR, BILIRUBINUR, KETONESUR, PROTEINUR, UROBILINOGEN, NITRITE, LEUKOCYTESUR in the last 72 hours.  Invalid input(s): APPERANCEUR     Imaging: DG Chest Port 1 View  Result Date: 01/31/2021 CLINICAL DATA:  Chest pain. EXAM: PORTABLE CHEST 1 VIEW COMPARISON:  Chest radiograph 09/26/2020; CT chest 09/20/2020 FINDINGS: Of note, patient is mildly rotated on this portable exam. Low lung volumes with confluent opacity throughout the left lung. Diffuse hazy interstitial thickening in the right lung without focal consolidation. No pneumothorax. Heart size is difficult to assess due to adjacent consolidations. Aortic calcifications. No mediastinal shift accounting for patient positioning. No acute osseous abnormality. IMPRESSION: Pulmonary edema with confluent opacity in the left lung favored to represent combination of at least moderate pleural effusion with compressive atelectasis, though infection is not excluded. Electronically Signed   By: Ileana Roup M.D.   On: 01/31/2021 09:41   CT CHEST ABDOMEN PELVIS WO CONTRAST  Result Date: 01/31/2021 CLINICAL DATA:  Chest and abdominal pain EXAM: CT CHEST, ABDOMEN AND PELVIS WITHOUT CONTRAST TECHNIQUE: Multidetector CT imaging of the chest, abdomen and pelvis was performed following the standard protocol without IV contrast. COMPARISON:  CT chest 09/20/2020 FINDINGS: CT CHEST FINDINGS Cardiovascular: Heart is enlarged. Small pericardial effusion. Extensive coronary artery calcifications. Mildly dilated pulmonary artery measuring 3.1 cm diameter. Thoracic aorta is tortuous and normal caliber with moderate calcified plaques. Mediastinum/Nodes: Previous right thyroidectomy changes. No bulky lymphadenopathy identified. Mildly enlarged right pretracheal lymph  node measuring 11 mm. Lungs/Pleura: Trace bilateral pleural effusions right greater than left. No focal consolidation identified in the lungs. No pneumothorax. Musculoskeletal: No chest wall mass or suspicious bone lesions identified. CT ABDOMEN PELVIS FINDINGS Hepatobiliary: Liver is upper normal size with no suspicious mass identified.  Gallbladder is mildly distended and contains dependent calculi. 1.4 cm round density in the distal common bile duct consistent with choledocholithiasis. The common bile duct is dilated measuring 1.8 cm in diameter. Mild intrahepatic ductal dilatation. Fat stranding in the right upper quadrant. Pancreas: Mild pancreatic and peripancreatic inflammatory fat stranding changes centered at the pancreatic head. No mass or ductal dilatation appreciated. Spleen: Spleen is enlarged measuring 16 cm in length. Adrenals/Urinary Tract: Adrenal glands appear normal. Kidneys are atrophic with multiple cysts bilaterally measuring up to 4 cm on the left. No hydronephrosis visualized. Urinary bladder incompletely visualized with appearance of mild wall thickening. Stomach/Bowel: No bowel obstruction, free air or pneumatosis. Mild wall thickening of the duodenum, likely reactive from adjacent inflammatory changes. Colonic diverticulosis. Appendix is normal. Vascular/Lymphatic: Severe calcified atherosclerotic plaques. No bulky lymphadenopathy identified. Multiple small retroperitoneal and upper abdominal lymph nodes are noted which are nonspecific. Reproductive: Prostate gland not well visualized. Other: Trace ascites. Musculoskeletal: Degenerative changes of the spine. Bilateral hip prosthesis. No suspicious bony lesions identified. IMPRESSION: 1. Evidence of acute pancreatitis. 2. Choledocholithiasis including a 1.4 cm calculus in the distal common bile duct. Biliary ductal dilatation suggesting obstruction. Consider ERCP. 3. Cholelithiasis with pericholecystic edema which may represent acute cholecystitis or reactive changes. 4. Colonic diverticulosis. 5. Splenomegaly. 6. Cardiomegaly with small pericardial effusion. 7. Small pleural effusions. 8. Coronary artery disease and atherosclerotic disease. Evidence of pulmonary artery hypertension. 9. Additional chronic findings as described. Electronically Signed   By: Ofilia Neas  M.D.   On: 01/31/2021 13:58     Medications:    sodium chloride 50 mL/hr at 01/31/21 1335   sodium chloride     sodium chloride      aspirin EC  81 mg Oral Daily   atorvastatin  40 mg Oral QHS   Chlorhexidine Gluconate Cloth  6 each Topical Q0600   clotrimazole  1 application Topical BID   diclofenac Sodium  2 g Topical q AM   finasteride  5 mg Oral Daily   loratadine  10 mg Oral Once per day on Sun Mon Wed Fri   [START ON 02/02/2021] midodrine  10 mg Oral Q T,Th,Sa-HD   midodrine  5 mg Oral Q0600   patiromer  16.8 g Oral Daily   pregabalin  75 mg Oral BID   rOPINIRole  1 mg Oral QHS   [START ON 02/02/2021] sevelamer carbonate  1,600 mg Oral 2 times per day on Tue Thu Sat   vitamin B-12  1,000 mcg Oral Daily     Assessment/ Plan:  Antonio Phillips is a 75 y.o.  male with past medical history including hypotension, obesity, peripheral artery disease, diabetes, hyperlipidemia, and end-stage renal disease on dialysis.  Patient presents to emergency department with abdominal pain persisting for 2 days.  Patient admitted for further evaluation for Choledocholithiasis [K80.50]  [facility]/TTS/Lt AVF   End-stage renal disease with hyperkalemia on dialysis.  Will maintain outpatient schedule, if possible.  Patient received dialysis yesterday, treatment was terminated early due to sustained tachycardia 130s to 140s.  Next dialysis treatment scheduled for Tuesday.  Potassium 5.7.  Will order Veltassa for management.  2. Anemia of chronic kidney disease Lab Results  Component Value Date   HGB 7.5 (L) 02/01/2021    Hemoglobin below target, will prescribe low-dose EPO with treatments  3. Secondary Hyperparathyroidism: Lab Results  Component Value Date   CALCIUM 8.4 (L) 02/01/2021   PHOS 3.7 09/27/2020    Continue sevelamer with meals    LOS: 1 Nyellie Yetter 11/28/20225:32 PM

## 2021-02-01 NOTE — ED Notes (Signed)
Patient to be sent to room 202 after dialysis is complete-dialysis RN and floor RN aware. Paperwork and creams sent to floor via tube station.

## 2021-02-01 NOTE — Consult Note (Signed)
Pharmacy Antibiotic Note  Antonio Phillips is a 75 y.o. male admitted on 01/31/2021 with  abdominal pain .  Pharmacy has been consulted for Zosyn dosing. Patient has end-stage renal disease and has been on hemodialysis since 2019.  Plan: Zosyn 2.25 g Q8 hours.  Height: 5\' 11"  (180.3 cm) Weight:  (unable to assess due to safety) IBW/kg (Calculated) : 75.3  Temp (24hrs), Avg:97.8 F (36.6 C), Min:97.4 F (36.3 C), Max:98.2 F (36.8 C)  Recent Labs  Lab 01/31/21 0909 02/01/21 0618  WBC 7.4 5.1  CREATININE 9.75* 8.12*    Estimated Creatinine Clearance: 9.2 mL/min (A) (by C-G formula based on SCr of 8.12 mg/dL (H)).    Allergies  Allergen Reactions   Simvastatin Other (See Comments)    Antimicrobials this admission: Zosyn 11/28 >>    Thank you for allowing pharmacy to be a part of this patient's care.  Nautica Hotz A Janani Chamber 02/01/2021 8:55 AM

## 2021-02-01 NOTE — ED Notes (Signed)
Patient given ginger ale at this time as requested.

## 2021-02-01 NOTE — Consult Note (Signed)
Cardiology Consultation:   Patient ID: Antonio Phillips MRN: [id number]; DOB: Feb 08, 1946  Admit date: 01/31/2021 Date of Consult: 02/01/2021  PCP:  Marsh Dolly, MD   [facility] Providers Cardiologist:  Ida Rogue, MD   }    Patient Profile:   Antonio Phillips is a 75 y.o. 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 Maren Beach.  History of Present Illness:   [name] is followed by Dr. Rockey Situ for the above cardiac issues. In September 2019 he was admitted to [facility] with chest pain and rapid Afib/SVT. Troponin was mildly elevated. Echo showed EF>55%. He was seen by cardiology and it was felt troponin elevation was secondary to demand ischemia with recommendation for BB and oral anticoagulation.  Patient was admitted 7/18-7/20 for NSTEMI s/p DES stent to obtuse marginal mild RCA stenosis, mild to mod pLAD disease. Echo showed preserved EF. He was started on triple therapt for 1 month with ASA, Plavix, Eliquis x 1 month, then ASA was held.   Readmitted 7/23-7/26 for chest pain. He had mild troponin elevation up to 144, likely supply demand ischemia int he setting of ESRD, anemia, E cole bacteremia. Limited echo showed EF 50%. Chest pain resolved and he was treated medically.   Patient admitted recently for abdominal pain. Patient reports intermittent abdominal pain for months, acutely worse. Reports associated nausea, vomiting. No chest pain, lower extremity edema. Also has some SOB, has not missed HD.  In the ER lipase was elevated. CT showed evidence of acute pancreatitis as well as cholelithiasis including a 1.4cm calculus in the distal CBD, bilary ductal dilation suggestive of obstruction. GI consulted and wanting to do ERCP. BNP elevated to over 1,000 and HS troponin 95>101. CXR showed  pulmonary edema with possible moderate pleural effusion. CT chest also showed cardiomegaly with small pericardial effusion, small pleural effusions, coronary arery disease and atherosclerotic disease.   Past Medical History:  Diagnosis Date   Demand ischemia ([place])    a. 11/2017 elev Trop in setting of AFib/SVT-->Echo [facility]): EF>55%. Mild LVH. Nl RV fxn.   Diabetes ([place])    a. Pt states that he has no hx of diabetes--A1c 5.5 11/2018.   ESRD (end stage renal disease) ([place])    a. On HD since 2019.   Hyperlipidemia    Hypotension    a. On midodrine.   Morbid obesity ([place])    PAD (peripheral artery disease) ([place])    a. s/p L AKA and R BKA.   Permanent atrial fibrillation ([place])    a. Dx 2019. CHA2DS2VASc = 3-4-->Eliquis.    Past Surgical History:  Procedure Laterality Date   AV FISTULA REPAIR     BELOW KNEE LEG AMPUTATION Bilateral    CHOLECYSTECTOMY     LEFT HEART CATH AND CORONARY ANGIOGRAPHY N/A 09/22/2020   Procedure: LEFT HEART CATH AND CORONARY ANGIOGRAPHY;  Surgeon: Sherren Mocha, MD;  Location: [place] [facility];  Service: Cardiovascular;  Laterality: N/A;   TOTAL HIP ARTHROPLASTY Bilateral      Home Medications:  Prior to Admission medications   Medication Sig Start Date End Date Taking? Authorizing Provider  acetaminophen (TYLENOL) 325 MG tablet Take 2 tablets (650 mg total) by mouth every 6 (six) hours as needed for mild pain or moderate pain. 01/17/20  Yes Wieting, Richard,  MD  apixaban (ELIQUIS) 5 MG TABS tablet Take 1 tablet (5 mg total) by mouth 2 (two) times daily. 01/17/20  Yes Wieting, Richard, MD  atorvastatin (LIPITOR) 40 MG tablet Take 40 mg by mouth at bedtime. 09/17/20  Yes [provider]  Carboxymethylcellulose Sod PF 0.5 % SOLN Place 1 drop into both eyes 3 (three) times daily as needed (dry eyes).   Yes [provider]  carvedilol (COREG) 3.125 MG tablet Take 1 tablet (3.125 mg total) by mouth 2 (two) times daily with a meal. 01/17/20  Yes  Wieting, Richard, MD  ciprofloxacin (CIPRO) 500 MG tablet Take 500 mg by mouth 2 (two) times daily. 01/21/21 02/03/21 Yes [provider]  clopidogrel (PLAVIX) 75 MG tablet Take 1 tablet (75 mg total) by mouth daily with breakfast. 09/23/20  Yes Wieting, Richard, MD  clotrimazole (LOTRIMIN) 1 % cream Apply 1 application topically 2 (two) times daily. (Apply under foreskin of penis) 01/27/21 02/10/21 Yes [provider]  diclofenac Sodium (VOLTAREN) 1 % GEL Apply 2 g topically in the morning. (Apply to right stump)   Yes [provider]  doxycycline (VIBRAMYCIN) 100 MG capsule Take 100 mg by mouth 2 (two) times daily. 01/20/21 02/02/21 Yes [provider]  finasteride (PROSCAR) 5 MG tablet Take 5 mg by mouth daily. 03/13/19  Yes [provider]  HYDROcodone-acetaminophen (NORCO/VICODIN) 5-325 MG tablet Take 1 tablet by mouth every 6 (six) hours as needed for severe pain. 09/23/20  Yes Wieting, Richard, MD  lactulose, encephalopathy, (CHRONULAC) 10 GM/15ML SOLN Take 10 g by mouth 2 (two) times daily as needed (mild constipation).   Yes [provider]  loperamide (IMODIUM A-D) 2 MG tablet Take 2 tablets (4 mg total) by mouth 4 (four) times daily as needed for diarrhea or loose stools. 09/09/19  Yes Carrie Mew, MD  loratadine (CLARITIN) 10 MG tablet Take 10 mg by mouth See admin instructions. Take 10 mg by mouth daily on Monday, Wednesday, Friday and Sunday   Yes [provider]  midodrine (PROAMATINE) 10 MG tablet Take 10 mg by mouth Every Tuesday,Thursday,and Saturday with dialysis.   Yes [provider]  midodrine (PROAMATINE) 5 MG tablet Take 5 mg by mouth daily at 6 (six) AM.   Yes [provider]  pregabalin (LYRICA) 75 MG capsule Take 75 mg by mouth 2 (two) times daily.   Yes [provider]  rOPINIRole (REQUIP) 1 MG tablet Take 1 mg by mouth at bedtime. 03/13/19  Yes [provider]  sevelamer  carbonate (RENVELA) 800 MG tablet Take 1,600 mg by mouth See admin instructions. Take 2 tablets (1600mg ) by mouth three times daily with meals on Monday, Wednesday, Friday and Sunday   Yes [provider]  sevelamer carbonate (RENVELA) 800 MG tablet Take 800 mg by mouth See admin instructions. Take 2 tablets (1600mg ) by mouth twice daily with meals (breakfast and dinner) on Tuesday, Thursday and Saturday   Yes [provider]  sodium hypochlorite (DAKIN'S 1/4 STRENGTH) 0.125 % SOLN Apply 1 application topically See admin instructions. Use as directed after cleansing left stump wound - use twice daily and as needed 01/15/21  Yes [provider]  vitamin B-12 (CYANOCOBALAMIN) 1000 MCG tablet Take 1,000 mcg by mouth daily.   Yes [provider]  mupirocin ointment (BACTROBAN) 2 % Place 1 application into the nose 2 (two) times daily. Patient not taking: Reported on 01/31/2021 09/23/20   Loletha Grayer, MD    Inpatient Medications: Scheduled  Meds:  atorvastatin  40 mg Oral QHS   Chlorhexidine Gluconate Cloth  6 each Topical Q0600   clotrimazole  1 application Topical BID   diclofenac Sodium  2 g Topical q AM   finasteride  5 mg Oral Daily   loratadine  10 mg Oral Once per day on Sun Mon Wed Fri   [START ON 02/02/2021] midodrine  10 mg Oral Q T,Th,Sa-HD   midodrine  5 mg Oral Q0600   patiromer  16.8 g Oral Daily   pregabalin  75 mg Oral BID   rOPINIRole  1 mg Oral QHS   [START ON 02/02/2021] sevelamer carbonate  1,600 mg Oral 2 times per day on Tue Thu Sat   vitamin B-12  1,000 mcg Oral Daily   Continuous Infusions:  sodium chloride 50 mL/hr at 01/31/21 1335   sodium chloride     sodium chloride     PRN Meds: sodium chloride, sodium chloride, acetaminophen, alteplase, heparin, HYDROcodone-acetaminophen, lactulose, lidocaine (PF), lidocaine-prilocaine, loperamide, morphine injection, pentafluoroprop-tetrafluoroeth, polyvinyl alcohol, sodium  hypochlorite  Allergies:    Allergies  Allergen Reactions   Simvastatin Other (See Comments)    Social History:   Social History   Socioeconomic History   Marital status: Single    Spouse name: Not on file   Number of children: Not on file   Years of education: Not on file   Highest education level: Not on file  Occupational History   Not on file  Tobacco Use   Smoking status: Never   Smokeless tobacco: Never  Substance and Sexual Activity   Alcohol use: Never   Drug use: Never   Sexual activity: Not on file  Other Topics Concern   Not on file  Social History Narrative   Lives @ local nsg facility.  Does not routinely exercise.   Social Determinants of Health   Financial Resource Strain: Not on file  Food Insecurity: Not on file  Transportation Needs: Not on file  Physical Activity: Not on file  Stress: Not on file  Social Connections: Not on file  Intimate Partner Violence: Not on file    Family History:   No family history on file.   ROS:  Please see the history of present illness.   All other ROS reviewed and negative.     Physical Exam/Data:   Vitals:   02/01/21 0830 02/01/21 1000 02/01/21 1100 02/01/21 1200  BP: (!) 85/57 98/72 94/82  92/11  Pulse: (!) 108 (!) 107 (!) 113 (!) 113  Resp: 12 13 19 14   Temp:      TempSrc:      SpO2: 91% 95% 97% 95%  Weight:      Height:        Intake/Output Summary (Last 24 hours) at 02/01/2021 1301 Last data filed at 01/31/2021 2022 Gross per 24 hour  Intake --  Output -93 ml  Net 93 ml   Last 3 Weights 01/31/2021 01/31/2021 10/09/2020  Weight (lbs) (No Data) 215 lb 2.7 oz 215 lb 1 oz  Weight (kg) (No Data) 97.6 kg 97.552 kg     Body mass index is 30.01 kg/m.  General:  Well nourished, well developed, in no acute distress HEENT: normal Neck: no JVD Vascular: No carotid bruits; Distal pulses 2+ bilaterally Cardiac:  normal S1, S2; RRR; no murmur  Lungs:  clear to auscultation bilaterally, no wheezing,  rhonchi or rales  Abd: soft, nontender, no hepatomegaly  Ext: no edema Musculoskeletal:  B/l amputation, BUE and BLE  strength normal and equal Skin: warm and dry  Neuro:  CNs 2-12 intact, no focal abnormalities noted Psych:  Normal affect   EKG:  The EKG was personally reviewed and demonstrates:  Afib, low voltage, 110, RAD, nonspecific T wave changes Telemetry:  Telemetry was personally reviewed and demonstrates:  Afib, HR 80-110s  Relevant CV Studies:  Limited 2D echo with Definity 09/28/2020: 1. Left ventricular ejection fraction, by estimation, is 45 to 50%. The  left ventricle has mildly decreased function. Left ventricular endocardial  border not optimally defined to evaluate regional wall motion. There is  mild left ventricular hypertrophy.   2. Right ventricular systolic function is moderately reduced. The right  ventricular size is normal. Mildly increased right ventricular wall  thickness. There is moderately elevated pulmonary artery systolic  pressure.   3. The mitral valve is abnormal. Trivial mitral valve regurgitation.   4. The aortic valve is tricuspid. There is mild thickening of the aortic  valve.   5. The inferior vena cava is dilated in size with <50% respiratory  variability, suggesting right atrial pressure of 15 mmHg.  __________   2D echo 09/27/2020:  1. Poor acoustic windows Endocardium is not well seen I would recomm  limited echo with Definity to help define LVEF and regional wall motion. .  There is mild left ventricular hypertrophy. Left ventricular diastolic  parameters are indeterminate.   2. RV is not seen well enough to evaluate function. . The right  ventricular size is mildly enlarged.   3. Left atrial size was severely dilated.   4. Right atrial size was severely dilated.   5. Mild mitral valve regurgitation.   6. Tricuspid valve regurgitation is mild to moderate.   7. The aortic valve is tricuspid. Aortic valve regurgitation is not   visualized. Mild aortic valve sclerosis is present, with no evidence of  aortic valve stenosis.  __________   LHC 09/22/2020:   Ost LAD to Prox LAD lesion is 50% stenosed.   2nd Mrg lesion is 99% stenosed.   A drug-eluting stent was successfully placed using a STENT RESOLUTE ONYX 2.5X15.   Post intervention, there is a 30% residual stenosis.   1.  Severe single-vessel coronary artery disease with 99% subtotal stenosis of the second obtuse marginal branch, treated with a 2.5 x 15 mm resolute Onyx DES.  30% residual stenosis at the case completion due to resistant/calcified lesion type even with high-pressure noncompliant balloon angioplasty. 2.  Mild to moderate nonobstructive proximal LAD stenosis 3.  Mild nonobstructive RCA stenosis  Recommendations: Aggressive medical therapy, as long as no bleeding complications arise, would resume apixaban tomorrow, aspirin 81 mg daily x30 days, and clopidogrel 75 mg daily x minimum 6 months. ___________   2D echo 09/21/2020: 1. Left ventricular ejection fraction, by estimation, is 60 to 65%. The  left ventricle has normal function. The left ventricle has no regional  wall motion abnormalities. Left ventricular diastolic parameters are  indeterminate.   2. Right ventricular systolic function is normal. The right ventricular  size is normal. Tricuspid regurgitation signal is inadequate for assessing  PA pressure.   3. Left atrial size was moderately dilated.   4. Right atrial size was moderately dilated.   5. Tricuspid valve regurgitation is mild to moderate.   6. The mitral valve is normal in structure. Mild mitral valve  regurgitation.   7. Rhythym is atrial fibrillation   Laboratory Data:  High Sensitivity Troponin:   Recent Labs  Lab 01/31/21 0909  01/31/21 1338  TROPONINIHS 95* 101*     Chemistry Recent Labs  Lab 01/31/21 0909 01/31/21 2030 02/01/21 0618  NA 138  --  136  K 5.7* 4.8 5.8*  CL 101  --  100  CO2 20*  --  23   GLUCOSE 76  --  78  BUN 89*  --  77*  CREATININE 9.75*  --  8.12*  CALCIUM 8.2*  --  8.4*  GFRNONAA 5*  --  6*  ANIONGAP 17*  --  13    Recent Labs  Lab 01/31/21 0909 02/01/21 0618  PROT 5.7* 5.2*  ALBUMIN 2.9* 2.8*  AST 67* 51*  ALT 44 42  ALKPHOS 387* 332*  BILITOT 4.2* 3.3*   Lipids No results for input(s): CHOL, TRIG, HDL, LABVLDL, LDLCALC, CHOLHDL in the last 168 hours.  Hematology Recent Labs  Lab 01/31/21 0909 02/01/21 0618  WBC 7.4 5.1  RBC 2.82* 2.47*  HGB 8.5* 7.5*  HCT 26.9* 23.7*  MCV 95.4 96.0  MCH 30.1 30.4  MCHC 31.6 31.6  RDW 17.4* 17.3*  PLT 62* 48*   Thyroid No results for input(s): TSH, FREET4 in the last 168 hours.  BNP Recent Labs  Lab 01/31/21 0909  BNP 1,051.9*    DDimer No results for input(s): DDIMER in the last 168 hours.   Radiology/Studies:  DG Chest Port 1 View  Result Date: 01/31/2021 CLINICAL DATA:  Chest pain. EXAM: PORTABLE CHEST 1 VIEW COMPARISON:  Chest radiograph 09/26/2020; CT chest 09/20/2020 FINDINGS: Of note, patient is mildly rotated on this portable exam. Low lung volumes with confluent opacity throughout the left lung. Diffuse hazy interstitial thickening in the right lung without focal consolidation. No pneumothorax. Heart size is difficult to assess due to adjacent consolidations. Aortic calcifications. No mediastinal shift accounting for patient positioning. No acute osseous abnormality. IMPRESSION: Pulmonary edema with confluent opacity in the left lung favored to represent combination of at least moderate pleural effusion with compressive atelectasis, though infection is not excluded. Electronically Signed   By: Ileana Roup M.D.   On: 01/31/2021 09:41   CT CHEST ABDOMEN PELVIS WO CONTRAST  Result Date: 01/31/2021 CLINICAL DATA:  Chest and abdominal pain EXAM: CT CHEST, ABDOMEN AND PELVIS WITHOUT CONTRAST TECHNIQUE: Multidetector CT imaging of the chest, abdomen and pelvis was performed following the standard  protocol without IV contrast. COMPARISON:  CT chest 09/20/2020 FINDINGS: CT CHEST FINDINGS Cardiovascular: Heart is enlarged. Small pericardial effusion. Extensive coronary artery calcifications. Mildly dilated pulmonary artery measuring 3.1 cm diameter. Thoracic aorta is tortuous and normal caliber with moderate calcified plaques. Mediastinum/Nodes: Previous right thyroidectomy changes. No bulky lymphadenopathy identified. Mildly enlarged right pretracheal lymph node measuring 11 mm. Lungs/Pleura: Trace bilateral pleural effusions right greater than left. No focal consolidation identified in the lungs. No pneumothorax. Musculoskeletal: No chest wall mass or suspicious bone lesions identified. CT ABDOMEN PELVIS FINDINGS Hepatobiliary: Liver is upper normal size with no suspicious mass identified. Gallbladder is mildly distended and contains dependent calculi. 1.4 cm round density in the distal common bile duct consistent with choledocholithiasis. The common bile duct is dilated measuring 1.8 cm in diameter. Mild intrahepatic ductal dilatation. Fat stranding in the right upper quadrant. Pancreas: Mild pancreatic and peripancreatic inflammatory fat stranding changes centered at the pancreatic head. No mass or ductal dilatation appreciated. Spleen: Spleen is enlarged measuring 16 cm in length. Adrenals/Urinary Tract: Adrenal glands appear normal. Kidneys are atrophic with multiple cysts bilaterally measuring up to 4 cm on the left. No hydronephrosis  visualized. Urinary bladder incompletely visualized with appearance of mild wall thickening. Stomach/Bowel: No bowel obstruction, free air or pneumatosis. Mild wall thickening of the duodenum, likely reactive from adjacent inflammatory changes. Colonic diverticulosis. Appendix is normal. Vascular/Lymphatic: Severe calcified atherosclerotic plaques. No bulky lymphadenopathy identified. Multiple small retroperitoneal and upper abdominal lymph nodes are noted which are  nonspecific. Reproductive: Prostate gland not well visualized. Other: Trace ascites. Musculoskeletal: Degenerative changes of the spine. Bilateral hip prosthesis. No suspicious bony lesions identified. IMPRESSION: 1. Evidence of acute pancreatitis. 2. Choledocholithiasis including a 1.4 cm calculus in the distal common bile duct. Biliary ductal dilatation suggesting obstruction. Consider ERCP. 3. Cholelithiasis with pericholecystic edema which may represent acute cholecystitis or reactive changes. 4. Colonic diverticulosis. 5. Splenomegaly. 6. Cardiomegaly with small pericardial effusion. 7. Small pleural effusions. 8. Coronary artery disease and atherosclerotic disease. Evidence of pulmonary artery hypertension. 9. Additional chronic findings as described. Electronically Signed   By: Ofilia Neas M.D.   On: 01/31/2021 13:58     Assessment and Plan:   Abdominal pain Choledocolithiasis Biliary pancreatitis - presented with abdominal pain - lipase elevated to 1000 - CT abd pelvis with evidence of acute pancreatitis as well as cholelithiasis including a 1.4cm calculus in the distal CBD, bilary ductal dilation suggestive of obstruction - Eliquis and plavix held - GI consulted>requesting to hold Eliquis and Plavix for ERCP - patient was recently stented 09/22/20 to 2nd Mrg, recommended Plavix x 6 months.   CAD s/p DES to 2nd Margin - NSTEMI treated with DES to 2nd Mrg lesion 09/22/20 - Recs for plavid for 6 months.   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  Hypotension - PTA midodrine 10mg  daily T, TH, Sat and midodrine 5mg  daily - PTA coreg held.    Elevated troponin - 95>101 - suspect demand ischemia in the setting of the above issues - No chest pain reported - No further ischemic work-up  DM2 - SSI per IM  Cardiac pre-op eval - No acute cardiac symptoms - No prior work-up prior to procedure - Spoke with MD, Ok to hold Eliquis and Plavix today, cover with ASA 81mg  perioperatively. Restart Eliquis and Palvix as soon as possible post-procedure.  - METS<4 he is b/l amputee - According to the Revised cardiac index he is Class IV risk at 15% 30 days risk of death, MI, or cardiac arrest  For questions or updates, please contact [place] Please consult [email or web address] for contact info under    Signed, Farzad Tibbetts Ninfa Meeker, PA-C  02/01/2021 1:01 PM

## 2021-02-01 NOTE — ED Notes (Signed)
Patient repositioned in bed and transported to dialysis at this time.

## 2021-02-01 NOTE — Consult Note (Signed)
Antonio Darby, MD [address]  [place]  [place], [place] [address]  Main: [phone]  Fax: [phone] Pager: [phone]   Consultation  Referring Provider:     No ref. provider found Primary Care Physician:  Marsh Dolly, MD Primary Gastroenterologist:   Althia Forts       Reason for Consultation:     Gallstone pancreatitis, choledocholithiasis  Date of Admission:  01/31/2021 Date of Consultation:  02/01/2021         HPI:   Antonio Phillips is a 75 y.o. male who is a resident of [facility] is admitted with epigastric pain, chest pain, nausea and vomiting.  Patient has history of ESRD on hemodialysis, peripheral artery disease, bilateral BKA, A. fib on Eliquis, severe single-vessel coronary artery disease, s/p PCI with drug-eluting stent in 7/22 on aspirin and Plavix, hypotension on midodrine, chronically on oxygen at 4 L/min.  Patient was hypotensive on arrival as well as with sinus tachycardia, received IV fluids.  Patient is also started on midodrine today.  Labs were significant for elevated lipase at 1021, elevated LFTs alkaline phosphatase 387, total bilirubin 4.2, AST 67, ALT 44, last normal LFTs about 10 years ago in 11/21 total bilirubin 1.4, CBC revealed normal WBC count, normocytic anemia hemoglobin 8.5 which is at baseline.  Patient also has history of chronic thrombocytopenia platelets 62, further dropped to 48 today along with drop in hemoglobin to 7.5.  Patient also has elevated troponin x 2 and BNP 1051 Patient underwent CT chest abdomen pelvis without contrast which revealed splenomegaly, mild pancreatic and peripancreatic inflammatory fat stranding, cholelithiasis and 1.4 cm round density in the distal CBD consistent with choledocholithiasis, CBD dilated to 1.8 cm with mild intrahepatic biliary dilation.  Normal-sized liver  Patient has been afebrile since admission, has mild tachycardia, MAP maintaining in the 80s, oxygenating 95 to 97% on room air.   He underwent dialysis yesterday.  Patient denies abdominal pain, nausea or vomiting today  NSAIDs: None  Antiplts/Anticoagulants/Anti thrombotics: Eliquis for history of A. fib, last dose on 11/27, DAPT for history of CAD, PCI since 09/2020  GI Procedures: None  Past Medical History:  Diagnosis Date   Demand ischemia ([place])    a. 11/2017 elev Trop in setting of AFib/SVT-->[facility]): EF>55%. Mild LVH. Nl RV fxn.   Diabetes ([place])    a. Pt states that he has no hx of diabetes--A1c 5.5 11/2018.   ESRD (end stage renal disease) ([place])    a. On HD since 2019.   Hyperlipidemia    Hypotension    a. On midodrine.   Morbid obesity ([place])    PAD (peripheral artery disease) ([place])    a. s/p L AKA and R BKA.   Permanent atrial fibrillation ([place])    a. Dx 2019. CHA2DS2VASc = 3-4-->Eliquis.    Past Surgical History:  Procedure Laterality Date   AV FISTULA REPAIR     BELOW KNEE LEG AMPUTATION Bilateral    CHOLECYSTECTOMY     LEFT HEART CATH AND CORONARY ANGIOGRAPHY N/A 09/22/2020   Procedure: LEFT HEART CATH AND CORONARY ANGIOGRAPHY;  Surgeon: Sherren Mocha, MD;  Location: [facility];  Service: Cardiovascular;  Laterality: N/A;   TOTAL HIP ARTHROPLASTY Bilateral     Prior to Admission medications   Medication Sig Start Date End Date Taking? Authorizing Provider  acetaminophen (TYLENOL) 325 MG tablet Take 2 tablets (650 mg total) by mouth every 6 (six) hours as needed for mild pain or  moderate pain. 01/17/20  Yes Wieting, Richard, MD  apixaban (ELIQUIS) 5 MG TABS tablet Take 1 tablet (5 mg total) by mouth 2 (two) times daily. 01/17/20  Yes Wieting, Richard, MD  atorvastatin (LIPITOR) 40 MG tablet Take 40 mg by mouth at bedtime. 09/17/20  Yes [provider]  Carboxymethylcellulose Sod PF 0.5 % SOLN Place 1 drop into both eyes 3 (three) times daily as needed (dry eyes).   Yes [provider]  carvedilol (COREG) 3.125 MG tablet Take 1 tablet (3.125 mg total) by mouth 2  (two) times daily with a meal. 01/17/20  Yes Wieting, Richard, MD  ciprofloxacin (CIPRO) 500 MG tablet Take 500 mg by mouth 2 (two) times daily. 01/21/21 02/03/21 Yes [provider]  clopidogrel (PLAVIX) 75 MG tablet Take 1 tablet (75 mg total) by mouth daily with breakfast. 09/23/20  Yes Wieting, Richard, MD  clotrimazole (LOTRIMIN) 1 % cream Apply 1 application topically 2 (two) times daily. (Apply under foreskin of penis) 01/27/21 02/10/21 Yes [provider]  diclofenac Sodium (VOLTAREN) 1 % GEL Apply 2 g topically in the morning. (Apply to right stump)   Yes [provider]  doxycycline (VIBRAMYCIN) 100 MG capsule Take 100 mg by mouth 2 (two) times daily. 01/20/21 02/02/21 Yes [provider]  finasteride (PROSCAR) 5 MG tablet Take 5 mg by mouth daily. 03/13/19  Yes [provider]  HYDROcodone-acetaminophen (NORCO/VICODIN) 5-325 MG tablet Take 1 tablet by mouth every 6 (six) hours as needed for severe pain. 09/23/20  Yes Wieting, Richard, MD  lactulose, encephalopathy, (CHRONULAC) 10 GM/15ML SOLN Take 10 g by mouth 2 (two) times daily as needed (mild constipation).   Yes [provider]  loperamide (IMODIUM A-D) 2 MG tablet Take 2 tablets (4 mg total) by mouth 4 (four) times daily as needed for diarrhea or loose stools. 09/09/19  Yes Carrie Mew, MD  loratadine (CLARITIN) 10 MG tablet Take 10 mg by mouth See admin instructions. Take 10 mg by mouth daily on Monday, Wednesday, Friday and Sunday   Yes [provider]  midodrine (PROAMATINE) 10 MG tablet Take 10 mg by mouth Every Tuesday,Thursday,and Saturday with dialysis.   Yes [provider]  midodrine (PROAMATINE) 5 MG tablet Take 5 mg by mouth daily at 6 (six) AM.   Yes [provider]  pregabalin (LYRICA) 75 MG capsule Take 75 mg by mouth 2 (two) times daily.   Yes [provider]  rOPINIRole (REQUIP) 1 MG tablet Take 1 mg by mouth at bedtime. 03/13/19  Yes  [provider]  sevelamer carbonate (RENVELA) 800 MG tablet Take 1,600 mg by mouth See admin instructions. Take 2 tablets (1640m) by mouth three times daily with meals on Monday, Wednesday, Friday and Sunday   Yes [provider]  sevelamer carbonate (RENVELA) 800 MG tablet Take 800 mg by mouth See admin instructions. Take 2 tablets (16014m by mouth twice daily with meals (breakfast and dinner) on Tuesday, Thursday and Saturday   Yes [provider]  sodium hypochlorite (DAKIN'S 1/4 STRENGTH) 0.125 % SOLN Apply 1 application topically See admin instructions. Use as directed after cleansing left stump wound - use twice daily and as needed 01/15/21  Yes [provider]  vitamin B-12 (CYANOCOBALAMIN) 1000 MCG tablet Take 1,000 mcg by mouth daily.   Yes [provider]  mupirocin ointment (BACTROBAN) 2 % Place 1 application into the nose 2 (two) times daily. Patient not taking: Reported on 01/31/2021 09/23/20   WiLoletha Grayer  MD    Current Facility-Administered Medications:    0.9 %  sodium chloride infusion, , Intravenous, Continuous, Cristal Deer, MD, Last Rate: 50 mL/hr at 01/31/21 1335, New Bag at 01/31/21 1335   0.9 %  sodium chloride infusion, 100 mL, Intravenous, PRN, Breeze, Shantelle, NP   0.9 %  sodium chloride infusion, 100 mL, Intravenous, PRN, Colon Flattery, NP   acetaminophen (TYLENOL) tablet 650 mg, 650 mg, Oral, Q6H PRN, Cristal Deer, MD   alteplase (CATHFLO ACTIVASE) injection 2 mg, 2 mg, Intracatheter, Once PRN, Colon Flattery, NP   aspirin EC tablet 81 mg, 81 mg, Oral, Daily, Furth, Cadence H, PA-C   atorvastatin (LIPITOR) tablet 40 mg, 40 mg, Oral, QHS, Cristal Deer, MD   Chlorhexidine Gluconate Cloth 2 % PADS 6 each, 6 each, Topical, Q0600, Cristal Deer, MD   clotrimazole (LOTRIMIN) 1 % cream 1 application, 1 application, Topical, BID, Cristal Deer, MD   diclofenac Sodium (VOLTAREN) 1 % topical gel 2 g, 2  g, Topical, q AM, Cristal Deer, MD, 2 g at 02/01/21 1011   finasteride (PROSCAR) tablet 5 mg, 5 mg, Oral, Daily, Cristal Deer, MD, 5 mg at 02/01/21 1008   heparin injection 1,000 Units, 1,000 Units, Dialysis, PRN, Colon Flattery, NP   HYDROcodone-acetaminophen (NORCO/VICODIN) 5-325 MG per tablet 1 tablet, 1 tablet, Oral, Q6H PRN, Cristal Deer, MD   lactulose (CHRONULAC) 10 GM/15ML solution 10 g, 10 g, Oral, BID PRN, Cristal Deer, MD   lidocaine (PF) (XYLOCAINE) 1 % injection 5 mL, 5 mL, Intradermal, PRN, Breeze, Shantelle, NP   lidocaine-prilocaine (EMLA) cream 1 application, 1 application, Topical, PRN, Gwyneth Revels, Shantelle, NP   loperamide (IMODIUM) capsule 4 mg, 4 mg, Oral, QID PRN, Cristal Deer, MD   loratadine (CLARITIN) tablet 10 mg, 10 mg, Oral, Once per day on Sun Mon Wed Fri, Ugah, Forrest Moron, MD, 10 mg at 02/01/21 1008   [START ON 02/02/2021] midodrine (PROAMATINE) tablet 10 mg, 10 mg, Oral, Q T,Th,Sa-HD, Kc, Ramesh, MD   midodrine (PROAMATINE) tablet 5 mg, 5 mg, Oral, Q0600, Kc, Ramesh, MD, 5 mg at 02/01/21 1008   morphine 2 MG/ML injection 2 mg, 2 mg, Intravenous, Q4H PRN, Mansy, Jan A, MD, 2 mg at 01/31/21 2337   patiromer Daryll Drown) packet 16.8 g, 16.8 g, Oral, Daily, Cristal Deer, MD, 16.8 g at 02/01/21 1251   pentafluoroprop-tetrafluoroeth (GEBAUERS) aerosol 1 application, 1 application, Topical, PRN, Breeze, Shantelle, NP   polyvinyl alcohol (LIQUIFILM TEARS) 1.4 % ophthalmic solution 1 drop, 1 drop, Both Eyes, TID PRN, Cristal Deer, MD   pregabalin (LYRICA) capsule 75 mg, 75 mg, Oral, BID, Cristal Deer, MD, 75 mg at 02/01/21 1008   rOPINIRole (REQUIP) tablet 1 mg, 1 mg, Oral, QHS, Cristal Deer, MD   [START ON 02/02/2021] sevelamer carbonate (RENVELA) tablet 1,600 mg, 1,600 mg, Oral, 2 times per day on Tue Thu Sat, Kc, Maren Beach, MD   sodium hypochlorite (DAKIN'S 1/4 STRENGTH) topical solution 1 application, 1 application, Topical, BID PRN, Cristal Deer, MD   vitamin B-12 (CYANOCOBALAMIN) tablet 1,000 mcg, 1,000 mcg, Oral, Daily, Cristal Deer, MD, 1,000 mcg at 02/01/21 1008  Current Outpatient Medications:    acetaminophen (TYLENOL) 325 MG tablet, Take 2 tablets (650 mg total) by mouth every 6 (six) hours as needed for mild pain or moderate pain., Disp: , Rfl:    apixaban (ELIQUIS) 5 MG TABS tablet, Take 1 tablet (5 mg total) by mouth 2 (two) times daily., Disp: 60 tablet, Rfl: 0   atorvastatin (LIPITOR) 40 MG tablet,  Take 40 mg by mouth at bedtime., Disp: , Rfl:    Carboxymethylcellulose Sod PF 0.5 % SOLN, Place 1 drop into both eyes 3 (three) times daily as needed (dry eyes)., Disp: , Rfl:    carvedilol (COREG) 3.125 MG tablet, Take 1 tablet (3.125 mg total) by mouth 2 (two) times daily with a meal., Disp: 60 tablet, Rfl: 0   ciprofloxacin (CIPRO) 500 MG tablet, Take 500 mg by mouth 2 (two) times daily., Disp: , Rfl:    clopidogrel (PLAVIX) 75 MG tablet, Take 1 tablet (75 mg total) by mouth daily with breakfast., Disp: 30 tablet, Rfl: 0   clotrimazole (LOTRIMIN) 1 % cream, Apply 1 application topically 2 (two) times daily. (Apply under foreskin of penis), Disp: , Rfl:    diclofenac Sodium (VOLTAREN) 1 % GEL, Apply 2 g topically in the morning. (Apply to right stump), Disp: , Rfl:    doxycycline (VIBRAMYCIN) 100 MG capsule, Take 100 mg by mouth 2 (two) times daily., Disp: , Rfl:    finasteride (PROSCAR) 5 MG tablet, Take 5 mg by mouth daily., Disp: , Rfl:    HYDROcodone-acetaminophen (NORCO/VICODIN) 5-325 MG tablet, Take 1 tablet by mouth every 6 (six) hours as needed for severe pain., Disp: 10 tablet, Rfl: 0   lactulose, encephalopathy, (CHRONULAC) 10 GM/15ML SOLN, Take 10 g by mouth 2 (two) times daily as needed (mild constipation)., Disp: , Rfl:    loperamide (IMODIUM A-D) 2 MG tablet, Take 2 tablets (4 mg total) by mouth 4 (four) times daily as needed for diarrhea or loose stools., Disp: 30 tablet, Rfl: 0   loratadine  (CLARITIN) 10 MG tablet, Take 10 mg by mouth See admin instructions. Take 10 mg by mouth daily on Monday, Wednesday, Friday and Sunday, Disp: , Rfl:    midodrine (PROAMATINE) 10 MG tablet, Take 10 mg by mouth Every Tuesday,Thursday,and Saturday with dialysis., Disp: , Rfl:    midodrine (PROAMATINE) 5 MG tablet, Take 5 mg by mouth daily at 6 (six) AM., Disp: , Rfl:    pregabalin (LYRICA) 75 MG capsule, Take 75 mg by mouth 2 (two) times daily., Disp: , Rfl:    rOPINIRole (REQUIP) 1 MG tablet, Take 1 mg by mouth at bedtime., Disp: , Rfl:    sevelamer carbonate (RENVELA) 800 MG tablet, Take 1,600 mg by mouth See admin instructions. Take 2 tablets (1666m) by mouth three times daily with meals on Monday, Wednesday, Friday and Sunday, Disp: , Rfl:    sevelamer carbonate (RENVELA) 800 MG tablet, Take 800 mg by mouth See admin instructions. Take 2 tablets (16041m by mouth twice daily with meals (breakfast and dinner) on Tuesday, Thursday and Saturday, Disp: , Rfl:    sodium hypochlorite (DAKIN'S 1/4 STRENGTH) 0.125 % SOLN, Apply 1 application topically See admin instructions. Use as directed after cleansing left stump wound - use twice daily and as needed, Disp: , Rfl:    vitamin B-12 (CYANOCOBALAMIN) 1000 MCG tablet, Take 1,000 mcg by mouth daily., Disp: , Rfl:    mupirocin ointment (BACTROBAN) 2 %, Place 1 application into the nose 2 (two) times daily. (Patient not taking: Reported on 01/31/2021), Disp: 22 g, Rfl: 0  No family history on file.   Social History   Tobacco Use   Smoking status: Never   Smokeless tobacco: Never  Substance Use Topics   Alcohol use: Never   Drug use: Never    Allergies as of 01/31/2021 - Review Complete 01/31/2021  Allergen Reaction Noted   Simvastatin Other (  See Comments) 03/19/2010    Review of Systems:    All systems reviewed and negative except where noted in HPI.   Physical Exam:  Vital signs in last 24 hours: Temp:  [97.4 F (36.3 C)-98.2 F (36.8 C)]  98.2 F (36.8 C) (11/27 2122) Pulse Rate:  [49-127] 49 (11/28 1430) Resp:  [9-23] 11 (11/28 1430) BP: (85-120)/(57-82) 91/60 (11/28 1430) SpO2:  [91 %-100 %] 92 % (11/28 1430) Last BM Date: 01/31/21 General:   Pleasant, cooperative in NAD Head:  Normocephalic and atraumatic. Eyes:   No icterus.   Conjunctiva pink. PERRLA. Ears:  Normal auditory acuity. Neck:  Supple; no masses or thyroidomegaly Lungs: Respirations even and unlabored. Lungs clear to auscultation bilaterally.   No wheezes, crackles, or rhonchi.  Heart:  Regular rate and rhythm;  Without murmur, clicks, rubs or gallops Abdomen:  Soft, nondistended, nontender. Normal bowel sounds. No appreciable masses or hepatomegaly.  No rebound or guarding.  Rectal:  Not performed. Msk: Bilateral BKA.  Strength generalized weakness Extremities:  Without edema, cyanosis or clubbing. Neurologic:  Alert and oriented x3;  grossly normal neurologically. Skin:  Intact without significant lesions or rashes. Cervical Nodes:  No significant cervical adenopathy. Psych:  Alert and cooperative. Normal affect.  LAB RESULTS: CBC Latest Ref Rng & Units 02/01/2021 01/31/2021 09/30/2020  WBC 4.0 - 10.5 K/uL 5.1 7.4 5.9  Hemoglobin 13.0 - 17.0 g/dL 7.5(L) 8.5(L) 9.0(L)  Hematocrit 39.0 - 52.0 % 23.7(L) 26.9(L) 28.6(L)  Platelets 150 - 400 K/uL 48(L) 62(L) 100(L)    BMET BMP Latest Ref Rng & Units 02/01/2021 01/31/2021 01/31/2021  Glucose 70 - 99 mg/dL 78 - 76  BUN 8 - 23 mg/dL 77(H) - 89(H)  Creatinine 0.61 - 1.24 mg/dL 8.12(H) - 9.75(H)  Sodium 135 - 145 mmol/L 136 - 138  Potassium 3.5 - 5.1 mmol/L 5.8(H) 4.8 5.7(H)  Chloride 98 - 111 mmol/L 100 - 101  CO2 22 - 32 mmol/L 23 - 20(L)  Calcium 8.9 - 10.3 mg/dL 8.4(L) - 8.2(L)    LFT Hepatic Function Latest Ref Rng & Units 02/01/2021 01/31/2021 09/27/2020  Total Protein 6.5 - 8.1 g/dL 5.2(L) 5.7(L) -  Albumin 3.5 - 5.0 g/dL 2.8(L) 2.9(L) 2.7(L)  AST 15 - 41 U/L 51(H) 67(H) -  ALT 0 - 44 U/L  42 44 -  Alk Phosphatase 38 - 126 U/L 332(H) 387(H) -  Total Bilirubin 0.3 - 1.2 mg/dL 3.3(H) 4.2(H) -  Bilirubin, Direct 0.0 - 0.2 mg/dL 1.7(H) - -     STUDIES: DG Chest Port 1 View  Result Date: 01/31/2021 CLINICAL DATA:  Chest pain. EXAM: PORTABLE CHEST 1 VIEW COMPARISON:  Chest radiograph 09/26/2020; CT chest 09/20/2020 FINDINGS: Of note, patient is mildly rotated on this portable exam. Low lung volumes with confluent opacity throughout the left lung. Diffuse hazy interstitial thickening in the right lung without focal consolidation. No pneumothorax. Heart size is difficult to assess due to adjacent consolidations. Aortic calcifications. No mediastinal shift accounting for patient positioning. No acute osseous abnormality. IMPRESSION: Pulmonary edema with confluent opacity in the left lung favored to represent combination of at least moderate pleural effusion with compressive atelectasis, though infection is not excluded. Electronically Signed   By: Ileana Roup M.D.   On: 01/31/2021 09:41   CT CHEST ABDOMEN PELVIS WO CONTRAST  Result Date: 01/31/2021 CLINICAL DATA:  Chest and abdominal pain EXAM: CT CHEST, ABDOMEN AND PELVIS WITHOUT CONTRAST TECHNIQUE: Multidetector CT imaging of the chest, abdomen and pelvis was performed following  the standard protocol without IV contrast. COMPARISON:  CT chest 09/20/2020 FINDINGS: CT CHEST FINDINGS Cardiovascular: Heart is enlarged. Small pericardial effusion. Extensive coronary artery calcifications. Mildly dilated pulmonary artery measuring 3.1 cm diameter. Thoracic aorta is tortuous and normal caliber with moderate calcified plaques. Mediastinum/Nodes: Previous right thyroidectomy changes. No bulky lymphadenopathy identified. Mildly enlarged right pretracheal lymph node measuring 11 mm. Lungs/Pleura: Trace bilateral pleural effusions right greater than left. No focal consolidation identified in the lungs. No pneumothorax. Musculoskeletal: No chest wall  mass or suspicious bone lesions identified. CT ABDOMEN PELVIS FINDINGS Hepatobiliary: Liver is upper normal size with no suspicious mass identified. Gallbladder is mildly distended and contains dependent calculi. 1.4 cm round density in the distal common bile duct consistent with choledocholithiasis. The common bile duct is dilated measuring 1.8 cm in diameter. Mild intrahepatic ductal dilatation. Fat stranding in the right upper quadrant. Pancreas: Mild pancreatic and peripancreatic inflammatory fat stranding changes centered at the pancreatic head. No mass or ductal dilatation appreciated. Spleen: Spleen is enlarged measuring 16 cm in length. Adrenals/Urinary Tract: Adrenal glands appear normal. Kidneys are atrophic with multiple cysts bilaterally measuring up to 4 cm on the left. No hydronephrosis visualized. Urinary bladder incompletely visualized with appearance of mild wall thickening. Stomach/Bowel: No bowel obstruction, free air or pneumatosis. Mild wall thickening of the duodenum, likely reactive from adjacent inflammatory changes. Colonic diverticulosis. Appendix is normal. Vascular/Lymphatic: Severe calcified atherosclerotic plaques. No bulky lymphadenopathy identified. Multiple small retroperitoneal and upper abdominal lymph nodes are noted which are nonspecific. Reproductive: Prostate gland not well visualized. Other: Trace ascites. Musculoskeletal: Degenerative changes of the spine. Bilateral hip prosthesis. No suspicious bony lesions identified. IMPRESSION: 1. Evidence of acute pancreatitis. 2. Choledocholithiasis including a 1.4 cm calculus in the distal common bile duct. Biliary ductal dilatation suggesting obstruction. Consider ERCP. 3. Cholelithiasis with pericholecystic edema which may represent acute cholecystitis or reactive changes. 4. Colonic diverticulosis. 5. Splenomegaly. 6. Cardiomegaly with small pericardial effusion. 7. Small pleural effusions. 8. Coronary artery disease and  atherosclerotic disease. Evidence of pulmonary artery hypertension. 9. Additional chronic findings as described. Electronically Signed   By: Ofilia Neas M.D.   On: 01/31/2021 13:58      Impression / Plan:   Antonio Phillips is a 75 y.o. male with history of peripheral artery disease s/p bilateral BKA, A. fib on Eliquis, CAD s/p PCI with DES in 09/2020 on DAPT, ESRD on hemodialysis is admitted with acute gallstone pancreatitis and choledocholithiasis  Acute gallstone pancreatitis, mild and uncomplicated Recommend IV fluids 137m/hr Diet as tolerated Symptomatic pain control, antiemetics as needed Patient will need cholecystectomy to prevent future attacks of recurrent gallstone pancreatitis  Choledocholithiasis without evidence of ascending cholangitis Patient will need ERCP +/- sphincterotomy for biliary stone extraction Last dose of Eliquis and Plavix were on 11/27, patient will need to be off Eliquis for 48 to 72 hours and Plavix for 5 days in order to perform sphincterotomy as it increases the risk of post sphincterotomy bleeding.  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 fib, coronary artery disease s/p PCI with DES in 7/22 Patient is currently on DAPT as well as Eliquis Has elevated BNP and troponins Cardiology recommends DAPT for minimum of 6 months Recommend cardiology  clearance prior to proceeding with ERCP  Thank you for involving me in the care of this patient.  GI will follow along with you    LOS: 1 day   Sherri Sear, MD  02/01/2021, 2:56 PM    Note: This dictation was prepared with Dragon dictation along with smaller phrase technology. Any transcriptional errors that result from this process are unintentional.

## 2021-02-01 NOTE — ED Notes (Signed)
Patient repositioned in bed at this time.

## 2021-02-01 NOTE — Consult Note (Addendum)
WOC Nurse Consult Note: Reason for Consult: Consult requested for left stump and right hip. Wound type: Left stump with full thickness wound; 100% red and moist, 3X6X.1cm Right hip with previous surgical incision which was previously healed, but Pt states an area recently re-opened and was being packed prior to admission. Measurement: .2X.2X6cm, appears to be red but difficult to visualize related to narrow opening.  Large amt tan drainage. Dressing procedure/placement/frequency: Topical treatment orders provided for bedside nurses to perform as follows:  Foam dressing to left stump, change Q 3 days or PRN soiling. Tuck a piece of gauze packing strip Kellie Simmering # [phone]) into right hip wound Q day, using a swab to fill and leaving a tail sticking out of the wound. Cover with foam dressing.  (Change foam dressing Q 3 days or PRN soiling.) Foam dressing to left stump, change Q 3 days or PRN soiling. Please re-consult if further assistance is needed.  Thank-you,  Julien Girt MSN, [place], [place], [place], [place]

## 2021-02-01 NOTE — Progress Notes (Signed)
Hemodialysis note:  Patient completed 3hours of treatment with no fluid removal.Patient arrived to dialysis with blood pressures in 80s, Dr.Lateef was notified and ordered 25gm of Albumin to be given with treatment. Blood pressures started coming up last hour of treatment.patient remained asymptomatic with hypotension. Hemastasis achieved,sites dressed with gauze/taped.

## 2021-02-01 NOTE — Progress Notes (Signed)
PROGRESS NOTE    Antonio Phillips  YWV:[id number] DOB: 11-07-1945 DOA: 01/31/2021 PCP: Marsh Dolly, MD   Chief Complaint  Patient presents with   Abdominal Pain   Chest Pain   Emesis  Brief Narrative/Hospital Course:  Antonio Phillips, 75 y.o. 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 fib on Eliquis, HLD, chronic hypotension on midodrine, morbid obesity presented from nursing home with complaint of thoracoabdominal pain and emesis.  Patient was having pain for 2 to 3 days in the abdomen and also vomiting pain in periumbilical and lower chest area.  Sen in the ED,cxr pulmonary edema,at least moderate pleural effusion with compressive atelectasis, CT chest abdomen pelvis showed acute pancreatitis, choledocholithiasis 1.4 cm calculus in the distal CBD with biliary duct dilatation suggesting obstruction ERCP advised, also with cholelithiasis with pericholecystic edema may represent acute cholecystitis or reactive changes, colonic diverticulosis,splenomegaly,cardiomegaly with small pericardial effusion,CAD.   Subjective:  Seen in ED  Alert awake,says his belly pain is gone. No nasuea or vomiting States his BP runs low is in 80-90s, asymptomatic  Assessment & Plan: Abdominal pain Choledocholithiasis Biliary pancreatitis-Lipase 1021  CT abd pelvis reviewed. GI consulted. Holding Eliquis, plavix. Continue supportive care pain management.  Discussed with GI-no need for antibiotics less suspicion of infection/cholangitis- as he has been afebrile no leukocytosis, abd pain is better now.  ESRD on HD: Nephrology on board on HD TTS.had short treatment 11/27 Mild hyperkalemia potassium 5.8 nephrology aware and notified- cont veltassa. Metabolic bone disease monitor calcium and Phos level  Hypotension on midodrine-resumed it  Permanent atrial fibrillation: On Eliquis.  No prior history of stroke as per the patient.  Holding  Eliquis due to need for ERCP  Anemia of chronic kidney disease hemoglobin slightly downtrending 7.5 g.  Monitor transfuse if less than 7 g.  Previous hemoglobin close to 7.8 to 8.5 g  Thrombocytopenia appears somewhat chronic platelet 62k> 48K, patient has a splenomegaly but no obvious cirrhosis.  Platelet has been on lower side and 96 to 100- in July. Will obtain hematology consult.  He may need transfusion ?? prior to ERCP- await timing of ERCP for transfusion and GI inputs.  Elevated troponin 95 >101 suspect demand ischemia w/ Esrd, acute illness- Obtain cardio eval.  CAD with severe single-vessel coronary disease- status post stenting advised aggressive medical therapy Eliquis,  aspirin x30 days and Plavix  x 6 months on his last cath 09/22/2020 by Dr Sharolyn Douglas.I have asked Dr Fletcher Anon to consult given need to hold his anticoagulation and plavix  in the setting of need for ERCP.  Wound of skin-wound care consulted  Class I Obesity:Patient's Body mass index is 30.01 kg/m. : Will benefit with PCP follow-up, weight loss  healthy lifestyle and outpatient sleep evaluation.  DVT prophylaxis: SCD Code Status:   Code Status: Full Code Family Communication: plan of care discussed with patient at bedside. Status is: Inpatient Remains inpatient appropriate because: Ongoing management of his choledocholithiasis and biliary pancreatitis ESRD and hyperkalemia Disposition: Currently Not medically stable for discharge. Anticipated Disposition: TBD  Objective: Vitals last 24 hrs: Vitals:   02/01/21 0830 02/01/21 1000 02/01/21 1100 02/01/21 1200  BP: (!) 85/57 98/72 94/82  94/67  Pulse: (!) 108 (!) 107 (!) 113 (!) 113  Resp: 12 13 19 14   Temp:      TempSrc:      SpO2: 91% 95% 97% 95%  Weight:  Height:       Weight change:   Intake/Output Summary (Last 24 hours) at 02/01/2021 1325 Last data filed at 01/31/2021 2022 Gross per 24 hour  Intake --  Output -93 ml  Net 93 ml   Net IO Since  Admission: 93 mL [02/01/21 1325]   Physical Examination: General exam: AA0X3, Obese, weak,older than stated age. HEENT:Oral mucosa moist, Ear/Nose WNL grossly,dentition normal. Respiratory system: B/l diminished BS, no use of accessory muscle, non tender. Cardiovascular system: S1 & S2 +,No JVD. Gastrointestinal system: Abdomen soft, NT,ND, BS+. Nervous System:Alert, awake, moving extremities. Extremities: edema none rt BKA and lt AKA Skin: No rashes, no icterus. MSK: Normal muscle bulk, tone, power.  Medications reviewed:  Scheduled Meds:  atorvastatin  40 mg Oral QHS   Chlorhexidine Gluconate Cloth  6 each Topical Q0600   clotrimazole  1 application Topical BID   diclofenac Sodium  2 g Topical q AM   finasteride  5 mg Oral Daily   loratadine  10 mg Oral Once per day on Sun Mon Wed Fri   [START ON 02/02/2021] midodrine  10 mg Oral Q T,Th,Sa-HD   midodrine  5 mg Oral Q0600   patiromer  16.8 g Oral Daily   pregabalin  75 mg Oral BID   rOPINIRole  1 mg Oral QHS   [START ON 02/02/2021] sevelamer carbonate  1,600 mg Oral 2 times per day on Tue Thu Sat   vitamin B-12  1,000 mcg Oral Daily   Continuous Infusions:  sodium chloride 50 mL/hr at 01/31/21 1335   sodium chloride     sodium chloride     Diet Order             Diet renal/carb modified with fluid restriction Diet-HS Snack? Nothing; Fluid restriction: 1200 mL Fluid; Room service appropriate? Yes; Fluid consistency: Thin  Diet effective now                 Weight change:   Wt Readings from Last 3 Encounters:  01/31/21 97.6 kg  10/09/20 97.6 kg  09/26/20 98 kg  Consultants:see note  Procedures:see note Antimicrobials: Anti-infectives (From admission, onward)    Start     Dose/Rate Route Frequency Ordered Stop   02/01/21 0900  piperacillin-tazobactam (ZOSYN) IVPB 2.25 g  Status:  Discontinued        2.25 g 100 mL/hr over 30 Minutes Intravenous Every 8 hours 02/01/21 0854 02/01/21 1013      Culture/Microbiology    Component Value Date/Time   SDES BLOOD RIGHT ANTECUBITAL 09/28/2020 1509   SPECREQUEST  09/28/2020 1509    BOTTLES DRAWN AEROBIC AND ANAEROBIC Blood Culture adequate volume   CULT  09/28/2020 1509    NO GROWTH 8 DAYS Performed at [facility], [address] [place], [place] [address]    REPTSTATUS 10/06/2020 FINAL 09/28/2020 1509    Other culture-see note  Unresulted Labs (From admission, onward)     Start     Ordered   02/02/21 0160  Basic metabolic panel  Tomorrow morning,   STAT        02/01/21 0843   02/02/21 0500  CBC  Tomorrow morning,   STAT        02/01/21 0843   02/02/21 0500  Hepatic function panel  Tomorrow morning,   STAT        02/01/21 1153   01/31/21 2143  Hepatitis B surface antibody,quantitative  (New Admission Hemo Labs (Hepatitis B))  Once,   STAT  01/31/21 2142   01/31/21 0900  Urinalysis, Routine w reflex microscopic  Once,   STAT        01/31/21 0901          Data Reviewed: I have personally reviewed following labs and imaging studies CBC: Recent Labs  Lab 01/31/21 0909 02/01/21 0618  WBC 7.4 5.1  NEUTROABS 6.0  --   HGB 8.5* 7.5*  HCT 26.9* 23.7*  MCV 95.4 96.0  PLT 62* 48*   Basic Metabolic Panel: Recent Labs  Lab 01/31/21 0909 01/31/21 2030 02/01/21 0618  NA 138  --  136  K 5.7* 4.8 5.8*  CL 101  --  100  CO2 20*  --  23  GLUCOSE 76  --  78  BUN 89*  --  77*  CREATININE 9.75*  --  8.12*  CALCIUM 8.2*  --  8.4*   GFR: Estimated Creatinine Clearance: 9.2 mL/min (A) (by C-G formula based on SCr of 8.12 mg/dL (H)). Liver Function Tests: Recent Labs  Lab 01/31/21 0909 02/01/21 0618  AST 67* 51*  ALT 44 42  ALKPHOS 387* 332*  BILITOT 4.2* 3.3*  PROT 5.7* 5.2*  ALBUMIN 2.9* 2.8*   Recent Labs  Lab 01/31/21 0909  LIPASE 1,021*   No results for input(s): AMMONIA in the last 168 hours. Coagulation Profile: No results for input(s): INR, PROTIME in the last 168 hours. Cardiac  Enzymes: No results for input(s): CKTOTAL, CKMB, CKMBINDEX, TROPONINI in the last 168 hours. BNP (last 3 results) No results for input(s): PROBNP in the last 8760 hours. HbA1C: No results for input(s): HGBA1C in the last 72 hours. CBG: No results for input(s): GLUCAP in the last 168 hours. Lipid Profile: No results for input(s): CHOL, HDL, LDLCALC, TRIG, CHOLHDL, LDLDIRECT in the last 72 hours. Thyroid Function Tests: No results for input(s): TSH, T4TOTAL, FREET4, T3FREE, THYROIDAB in the last 72 hours. Anemia Panel: No results for input(s): VITAMINB12, FOLATE, FERRITIN, TIBC, IRON, RETICCTPCT in the last 72 hours. Sepsis Labs: No results for input(s): PROCALCITON, LATICACIDVEN in the last 168 hours.  Recent Results (from the past 240 hour(s))  Resp Panel by RT-PCR (Flu A&B, Covid) Nasopharyngeal Swab     Status: None   Collection Time: 01/31/21  9:11 AM   Specimen: Nasopharyngeal Swab; Nasopharyngeal(NP) swabs in vial transport medium  Result Value Ref Range Status   SARS Coronavirus 2 by RT PCR NEGATIVE NEGATIVE Final    Comment: (NOTE) SARS-CoV-2 target nucleic acids are NOT DETECTED.  The SARS-CoV-2 RNA is generally detectable in upper respiratory specimens during the acute phase of infection. The lowest concentration of SARS-CoV-2 viral copies this assay can detect is 138 copies/mL. A negative result does not preclude SARS-Cov-2 infection and should not be used as the sole basis for treatment or other patient management decisions. A negative result may occur with  improper specimen collection/handling, submission of specimen other than nasopharyngeal swab, presence of viral mutation(s) within the areas targeted by this assay, and inadequate number of viral copies(<138 copies/mL). A negative result must be combined with clinical observations, patient history, and epidemiological information. The expected result is Negative.  Fact Sheet for Patients:   EntrepreneurPulse.com.au  Fact Sheet for Healthcare Providers:  IncredibleEmployment.be  This test is no t yet approved or cleared by the [place] FDA and  has been authorized for detection and/or diagnosis of SARS-CoV-2 by FDA under an Emergency Use Authorization (EUA). This EUA will remain  in effect (meaning this test can be used)  for the duration of the COVID-19 declaration under Section 564(b)(1) of the Act, 21 U.S.C.section 360bbb-3(b)(1), unless the authorization is terminated  or revoked sooner.       Influenza A by PCR NEGATIVE NEGATIVE Final   Influenza B by PCR NEGATIVE NEGATIVE Final    Comment: (NOTE) The Xpert Xpress SARS-CoV-2/FLU/RSV plus assay is intended as an aid in the diagnosis of influenza from Nasopharyngeal swab specimens and should not be used as a sole basis for treatment. Nasal washings and aspirates are unacceptable for Xpert Xpress SARS-CoV-2/FLU/RSV testing.  Fact Sheet for Patients: EntrepreneurPulse.com.au  Fact Sheet for Healthcare Providers: IncredibleEmployment.be  This test is not yet approved or cleared by the [place] FDA and has been authorized for detection and/or diagnosis of SARS-CoV-2 by FDA under an Emergency Use Authorization (EUA). This EUA will remain in effect (meaning this test can be used) for the duration of the COVID-19 declaration under Section 564(b)(1) of the Act, 21 U.S.C. section 360bbb-3(b)(1), unless the authorization is terminated or revoked.  Performed at [facility], [address], [place],  [address]      Radiology Studies: [facility] Chest [place] 1 View  Result Date: 01/31/2021 CLINICAL DATA:  Chest pain. EXAM: PORTABLE CHEST 1 VIEW COMPARISON:  Chest radiograph 09/26/2020; CT chest 09/20/2020 FINDINGS: Of note, patient is mildly rotated on this portable exam. Low lung volumes with confluent opacity throughout the  left lung. Diffuse hazy interstitial thickening in the right lung without focal consolidation. No pneumothorax. Heart size is difficult to assess due to adjacent consolidations. Aortic calcifications. No mediastinal shift accounting for patient positioning. No acute osseous abnormality. IMPRESSION: Pulmonary edema with confluent opacity in the left lung favored to represent combination of at least moderate pleural effusion with compressive atelectasis, though infection is not excluded. Electronically Signed   By: Ileana Roup M.D.   On: 01/31/2021 09:41   CT CHEST ABDOMEN PELVIS WO CONTRAST  Result Date: 01/31/2021 CLINICAL DATA:  Chest and abdominal pain EXAM: CT CHEST, ABDOMEN AND PELVIS WITHOUT CONTRAST TECHNIQUE: Multidetector CT imaging of the chest, abdomen and pelvis was performed following the standard protocol without IV contrast. COMPARISON:  CT chest 09/20/2020 FINDINGS: CT CHEST FINDINGS Cardiovascular: Heart is enlarged. Small pericardial effusion. Extensive coronary artery calcifications. Mildly dilated pulmonary artery measuring 3.1 cm diameter. Thoracic aorta is tortuous and normal caliber with moderate calcified plaques. Mediastinum/Nodes: Previous right thyroidectomy changes. No bulky lymphadenopathy identified. Mildly enlarged right pretracheal lymph node measuring 11 mm. Lungs/Pleura: Trace bilateral pleural effusions right greater than left. No focal consolidation identified in the lungs. No pneumothorax. Musculoskeletal: No chest wall mass or suspicious bone lesions identified. CT ABDOMEN PELVIS FINDINGS Hepatobiliary: Liver is upper normal size with no suspicious mass identified. Gallbladder is mildly distended and contains dependent calculi. 1.4 cm round density in the distal common bile duct consistent with choledocholithiasis. The common bile duct is dilated measuring 1.8 cm in diameter. Mild intrahepatic ductal dilatation. Fat stranding in the right upper quadrant. Pancreas: Mild  pancreatic and peripancreatic inflammatory fat stranding changes centered at the pancreatic head. No mass or ductal dilatation appreciated. Spleen: Spleen is enlarged measuring 16 cm in length. Adrenals/Urinary Tract: Adrenal glands appear normal. Kidneys are atrophic with multiple cysts bilaterally measuring up to 4 cm on the left. No hydronephrosis visualized. Urinary bladder incompletely visualized with appearance of mild wall thickening. Stomach/Bowel: No bowel obstruction, free air or pneumatosis. Mild wall thickening of the duodenum, likely reactive from adjacent inflammatory changes. Colonic diverticulosis. Appendix is normal.  Vascular/Lymphatic: Severe calcified atherosclerotic plaques. No bulky lymphadenopathy identified. Multiple small retroperitoneal and upper abdominal lymph nodes are noted which are nonspecific. Reproductive: Prostate gland not well visualized. Other: Trace ascites. Musculoskeletal: Degenerative changes of the spine. Bilateral hip prosthesis. No suspicious bony lesions identified. IMPRESSION: 1. Evidence of acute pancreatitis. 2. Choledocholithiasis including a 1.4 cm calculus in the distal common bile duct. Biliary ductal dilatation suggesting obstruction. Consider ERCP. 3. Cholelithiasis with pericholecystic edema which may represent acute cholecystitis or reactive changes. 4. Colonic diverticulosis. 5. Splenomegaly. 6. Cardiomegaly with small pericardial effusion. 7. Small pleural effusions. 8. Coronary artery disease and atherosclerotic disease. Evidence of pulmonary artery hypertension. 9. Additional chronic findings as described. Electronically Signed   By: Ofilia Neas M.D.   On: 01/31/2021 13:58     LOS: 1 day   Antonieta Pert, MD [facility]  02/01/2021, 1:25 PM

## 2021-02-01 NOTE — Consult Note (Signed)
Hematology/Oncology Consult note [facility] [phone] [phone]  Patient Care Team: Marsh Dolly, MD as PCP - General (Internal Medicine) Minna Merritts, MD as PCP - Cardiology (Cardiology)   Name of the patient: Antonio Phillips  [id number]  09/13/1945    Reason for consult: Anemia/thrombocytopenia   Requesting physician: Dr.Kc Maren Beach  Date of visit: 02/01/2021    History of presenting illness- Patient is a 75 year old male with a past medical history significant for hypertension hyperlipidemia morbid obesity peripheral arterial disease end-stage renal disease on dialysis A. fib on Eliquis who presented with symptoms of upper abdominal and midepigastric pain patient was evaluated by GI Dr. Terri Piedra.  CT chest abdomen and pelvis showed splenomegaly mild pancreatic and peripancreatic inflammatory fat stranding and cholelithiasis with a 1.4 cm density in the distal CBD consistent with choledocholithiasis.  Clinically there is no present concern for acute cholangitis.  Plan is for ERCP in 1 to 2 days with stent placement and possibility for a definitive sphincterotomy down the line.  Hematology consulted for thrombocytopenia.  Looking back at her prior CBCs patient has had some chronic thrombocytopenia with a platelet count that has mostly remained in the 100s up until July 2022.  On admission yesterday she was noted to have platelet count of 62 which went down to 48 today.  Also at baseline her hemoglobin is between 8 and 9 and presently at 7.5.  Hematology consulted for recommendations to improve her platelet count prior to ERCP.  Patient drowsy at the time of my visit today and would not wake up to give me accurate history  ECOG PS- 3     Review of systems-unable to obtain as patient was sleepy/drowsy this morning and would not give accurate history. Allergies  Allergen Reactions   Simvastatin Other (See Comments)    Patient Active  Problem List   Diagnosis Date Noted   Preop cardiovascular exam    Abdominal pain 01/31/2021   Choledocholithiasis 01/31/2021   Morbid obesity ([place])    Sepsis due to Escherichia coli ([place])    Wound of skin    Chronic hypotension    Demand ischemia (HCC)    Permanent atrial fibrillation ([place])    Chest pain 09/26/2020   PVD (peripheral vascular disease) ([place])    Shortness of breath 09/20/2020   NSTEMI (non-ST elevated myocardial infarction) ([place]) 09/20/2020   Fungal skin infection    Atrial fibrillation, chronic (HCC)    Acquired thrombophilia ([place])    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 11/2017 elev Trop in setting of AFib/SVT-->[facility]): EF>55%. Mild LVH. Nl RV fxn.   Diabetes ([place])    a. Pt states that he has no hx of diabetes--A1c 5.5 11/2018.   ESRD (end stage renal disease) ([place])    a. On HD since 2019.   Hyperlipidemia    Hypotension    a. On midodrine.   Morbid obesity ([place])    PAD (peripheral artery disease) ([place])    a. s/p L AKA and R BKA.   Permanent atrial fibrillation ([place])    a. Dx 2019. CHA2DS2VASc = 3-4-->Eliquis.  Past Surgical History:  Procedure Laterality Date   AV FISTULA REPAIR     BELOW KNEE LEG AMPUTATION Bilateral    CHOLECYSTECTOMY     LEFT HEART CATH AND CORONARY ANGIOGRAPHY N/A 09/22/2020   Procedure: LEFT HEART CATH AND CORONARY ANGIOGRAPHY;  Surgeon: Sherren Mocha, MD;  Location: [place] [facility];  Service: Cardiovascular;  Laterality: N/A;   TOTAL HIP ARTHROPLASTY Bilateral     Social History   Socioeconomic History   Marital status: Single    Spouse name: Not on file   Number of children: Not on file   Years of  education: Not on file   Highest education level: Not on file  Occupational History   Not on file  Tobacco Use   Smoking status: Never   Smokeless tobacco: Never  Substance and Sexual Activity   Alcohol use: Never   Drug use: Never   Sexual activity: Not on file  Other Topics Concern   Not on file  Social History Narrative   Lives @ local nsg facility.  Does not routinely exercise.   Social Determinants of Health   Financial Resource Strain: Not on file  Food Insecurity: Not on file  Transportation Needs: Not on file  Physical Activity: Not on file  Stress: Not on file  Social Connections: Not on file  Intimate Partner Violence: Not on file     No family history on file.   Current Facility-Administered Medications:    0.9 %  sodium chloride infusion, , Intravenous, Continuous, Cristal Deer, MD, Last Rate: 50 mL/hr at 01/31/21 1335, New Bag at 01/31/21 1335   0.9 %  sodium chloride infusion, 100 mL, Intravenous, PRN, Breeze, Shantelle, NP   0.9 %  sodium chloride infusion, 100 mL, Intravenous, PRN, Colon Flattery, NP   acetaminophen (TYLENOL) tablet 650 mg, 650 mg, Oral, Q6H PRN, Cristal Deer, MD   alteplase (CATHFLO ACTIVASE) injection 2 mg, 2 mg, Intracatheter, Once PRN, Colon Flattery, NP   aspirin EC tablet 81 mg, 81 mg, Oral, Daily, Furth, Cadence H, PA-C, 81 mg at 02/01/21 1508   atorvastatin (LIPITOR) tablet 40 mg, 40 mg, Oral, QHS, Cristal Deer, MD   Chlorhexidine Gluconate Cloth 2 % PADS 6 each, 6 each, Topical, Q0600, Cristal Deer, MD   clotrimazole (LOTRIMIN) 1 % cream 1 application, 1 application, Topical, BID, Cristal Deer, MD   diclofenac Sodium (VOLTAREN) 1 % topical gel 2 g, 2 g, Topical, q AM, Cristal Deer, MD, 2 g at 02/01/21 1011   finasteride (PROSCAR) tablet 5 mg, 5 mg, Oral, Daily, Cristal Deer, MD, 5 mg at 02/01/21 1008   heparin injection 1,000 Units, 1,000 Units, Dialysis, PRN, Colon Flattery, NP    HYDROcodone-acetaminophen (NORCO/VICODIN) 5-325 MG per tablet 1 tablet, 1 tablet, Oral, Q6H PRN, Cristal Deer, MD   lactulose (CHRONULAC) 10 GM/15ML solution 10 g, 10 g, Oral, BID PRN, Cristal Deer, MD   lidocaine (PF) (XYLOCAINE) 1 % injection 5 mL, 5 mL, Intradermal, PRN, Breeze, Shantelle, NP   lidocaine-prilocaine (EMLA) cream 1 application, 1 application, Topical, PRN, Breeze, Shantelle, NP   loperamide (IMODIUM) capsule 4 mg, 4 mg, Oral, QID PRN, Cristal Deer, MD   loratadine (CLARITIN) tablet 10 mg, 10 mg, Oral, Once per day on Sun Mon Wed Fri, Ugah, Nwannadiya, MD, 10 mg at 02/01/21 1008   [START ON 02/02/2021] midodrine (PROAMATINE) tablet 10 mg, 10 mg, Oral, Q T,Th,Sa-HD, Kc, Ramesh, MD   midodrine (PROAMATINE) tablet 5 mg, 5 mg, Oral, Q0600, Antonieta Pert, MD,  5 mg at 02/01/21 1008   morphine 2 MG/ML injection 2 mg, 2 mg, Intravenous, Q4H PRN, Mansy, Jan A, MD, 2 mg at 01/31/21 2337   patiromer Daryll Drown) packet 16.8 g, 16.8 g, Oral, Daily, Cristal Deer, MD, 16.8 g at 02/01/21 1251   pentafluoroprop-tetrafluoroeth (GEBAUERS) aerosol 1 application, 1 application, Topical, PRN, Breeze, Shantelle, NP   polyvinyl alcohol (LIQUIFILM TEARS) 1.4 % ophthalmic solution 1 drop, 1 drop, Both Eyes, TID PRN, Cristal Deer, MD   pregabalin (LYRICA) capsule 75 mg, 75 mg, Oral, BID, Cristal Deer, MD, 75 mg at 02/01/21 1008   rOPINIRole (REQUIP) tablet 1 mg, 1 mg, Oral, QHS, Cristal Deer, MD   [START ON 02/02/2021] sevelamer carbonate (RENVELA) tablet 1,600 mg, 1,600 mg, Oral, 2 times per day on Tue Thu Sat, Kc, Maren Beach, MD   sodium hypochlorite (DAKIN'S 1/4 STRENGTH) topical solution 1 application, 1 application, Topical, BID PRN, Cristal Deer, MD   vitamin B-12 (CYANOCOBALAMIN) tablet 1,000 mcg, 1,000 mcg, Oral, Daily, Cristal Deer, MD, 1,000 mcg at 02/01/21 1008  Current Outpatient Medications:    acetaminophen (TYLENOL) 325 MG tablet, Take 2 tablets (650 mg total) by  mouth every 6 (six) hours as needed for mild pain or moderate pain., Disp: , Rfl:    apixaban (ELIQUIS) 5 MG TABS tablet, Take 1 tablet (5 mg total) by mouth 2 (two) times daily., Disp: 60 tablet, Rfl: 0   atorvastatin (LIPITOR) 40 MG tablet, Take 40 mg by mouth at bedtime., Disp: , Rfl:    Carboxymethylcellulose Sod PF 0.5 % SOLN, Place 1 drop into both eyes 3 (three) times daily as needed (dry eyes)., Disp: , Rfl:    carvedilol (COREG) 3.125 MG tablet, Take 1 tablet (3.125 mg total) by mouth 2 (two) times daily with a meal., Disp: 60 tablet, Rfl: 0   ciprofloxacin (CIPRO) 500 MG tablet, Take 500 mg by mouth 2 (two) times daily., Disp: , Rfl:    clopidogrel (PLAVIX) 75 MG tablet, Take 1 tablet (75 mg total) by mouth daily with breakfast., Disp: 30 tablet, Rfl: 0   clotrimazole (LOTRIMIN) 1 % cream, Apply 1 application topically 2 (two) times daily. (Apply under foreskin of penis), Disp: , Rfl:    diclofenac Sodium (VOLTAREN) 1 % GEL, Apply 2 g topically in the morning. (Apply to right stump), Disp: , Rfl:    doxycycline (VIBRAMYCIN) 100 MG capsule, Take 100 mg by mouth 2 (two) times daily., Disp: , Rfl:    finasteride (PROSCAR) 5 MG tablet, Take 5 mg by mouth daily., Disp: , Rfl:    HYDROcodone-acetaminophen (NORCO/VICODIN) 5-325 MG tablet, Take 1 tablet by mouth every 6 (six) hours as needed for severe pain., Disp: 10 tablet, Rfl: 0   lactulose, encephalopathy, (CHRONULAC) 10 GM/15ML SOLN, Take 10 g by mouth 2 (two) times daily as needed (mild constipation)., Disp: , Rfl:    loperamide (IMODIUM A-D) 2 MG tablet, Take 2 tablets (4 mg total) by mouth 4 (four) times daily as needed for diarrhea or loose stools., Disp: 30 tablet, Rfl: 0   loratadine (CLARITIN) 10 MG tablet, Take 10 mg by mouth See admin instructions. Take 10 mg by mouth daily on Monday, Wednesday, Friday and Sunday, Disp: , Rfl:    midodrine (PROAMATINE) 10 MG tablet, Take 10 mg by mouth Every Tuesday,Thursday,and Saturday with  dialysis., Disp: , Rfl:    midodrine (PROAMATINE) 5 MG tablet, Take 5 mg by mouth daily at 6 (six) AM., Disp: , Rfl:    pregabalin (LYRICA) 75  MG capsule, Take 75 mg by mouth 2 (two) times daily., Disp: , Rfl:    rOPINIRole (REQUIP) 1 MG tablet, Take 1 mg by mouth at bedtime., Disp: , Rfl:    sevelamer carbonate (RENVELA) 800 MG tablet, Take 1,600 mg by mouth See admin instructions. Take 2 tablets (1600mg ) by mouth three times daily with meals on Monday, Wednesday, Friday and Sunday, Disp: , Rfl:    sevelamer carbonate (RENVELA) 800 MG tablet, Take 800 mg by mouth See admin instructions. Take 2 tablets (1600mg ) by mouth twice daily with meals (breakfast and dinner) on Tuesday, Thursday and Saturday, Disp: , Rfl:    sodium hypochlorite (DAKIN'S 1/4 STRENGTH) 0.125 % SOLN, Apply 1 application topically See admin instructions. Use as directed after cleansing left stump wound - use twice daily and as needed, Disp: , Rfl:    vitamin B-12 (CYANOCOBALAMIN) 1000 MCG tablet, Take 1,000 mcg by mouth daily., Disp: , Rfl:    mupirocin ointment (BACTROBAN) 2 %, Place 1 application into the nose 2 (two) times daily. (Patient not taking: Reported on 01/31/2021), Disp: 22 g, Rfl: 0   Physical exam:  Vitals:   02/01/21 1645 02/01/21 1700 02/01/21 1715 02/01/21 1730  BP: (!) 80/55 (!) 82/60 (!) 88/67 91/66  Pulse: (!) 124  (!) 120 (!) 122  Resp: 18     Temp:      TempSrc:      SpO2:    97%  Weight:      Height:       Physical Exam Cardiovascular:     Rate and Rhythm: Normal rate and regular rhythm.     Heart sounds: Normal heart sounds.  Pulmonary:     Effort: Pulmonary effort is normal.     Breath sounds: Normal breath sounds.  Abdominal:     General: Bowel sounds are normal.     Palpations: Abdomen is soft.  Skin:    General: Skin is warm and dry.  Neurological:     Mental Status: He is alert.       CMP Latest Ref Rng & Units 02/01/2021  Glucose 70 - 99 mg/dL 78  BUN 8 - 23 mg/dL 77(H)   Creatinine 0.61 - 1.24 mg/dL 8.12(H)  Sodium 135 - 145 mmol/L 136  Potassium 3.5 - 5.1 mmol/L 5.8(H)  Chloride 98 - 111 mmol/L 100  CO2 22 - 32 mmol/L 23  Calcium 8.9 - 10.3 mg/dL 8.4(L)  Total Protein 6.5 - 8.1 g/dL 5.2(L)  Total Bilirubin 0.3 - 1.2 mg/dL 3.3(H)  Alkaline Phos 38 - 126 U/L 332(H)  AST 15 - 41 U/L 51(H)  ALT 0 - 44 U/L 42   CBC Latest Ref Rng & Units 02/01/2021  WBC 4.0 - 10.5 K/uL 5.1  Hemoglobin 13.0 - 17.0 g/dL 7.5(L)  Hematocrit 39.0 - 52.0 % 23.7(L)  Platelets 150 - 400 K/uL 48(L)    @IMAGES @  DG Chest Port 1 View  Result Date: 01/31/2021 CLINICAL DATA:  Chest pain. EXAM: PORTABLE CHEST 1 VIEW COMPARISON:  Chest radiograph 09/26/2020; CT chest 09/20/2020 FINDINGS: Of note, patient is mildly rotated on this portable exam. Low lung volumes with confluent opacity throughout the left lung. Diffuse hazy interstitial thickening in the right lung without focal consolidation. No pneumothorax. Heart size is difficult to assess due to adjacent consolidations. Aortic calcifications. No mediastinal shift accounting for patient positioning. No acute osseous abnormality. IMPRESSION: Pulmonary edema with confluent opacity in the left lung favored to represent combination of at least moderate pleural  effusion with compressive atelectasis, though infection is not excluded. Electronically Signed   By: Ileana Roup M.D.   On: 01/31/2021 09:41   CT CHEST ABDOMEN PELVIS WO CONTRAST  Result Date: 01/31/2021 CLINICAL DATA:  Chest and abdominal pain EXAM: CT CHEST, ABDOMEN AND PELVIS WITHOUT CONTRAST TECHNIQUE: Multidetector CT imaging of the chest, abdomen and pelvis was performed following the standard protocol without IV contrast. COMPARISON:  CT chest 09/20/2020 FINDINGS: CT CHEST FINDINGS Cardiovascular: Heart is enlarged. Small pericardial effusion. Extensive coronary artery calcifications. Mildly dilated pulmonary artery measuring 3.1 cm diameter. Thoracic aorta is tortuous and  normal caliber with moderate calcified plaques. Mediastinum/Nodes: Previous right thyroidectomy changes. No bulky lymphadenopathy identified. Mildly enlarged right pretracheal lymph node measuring 11 mm. Lungs/Pleura: Trace bilateral pleural effusions right greater than left. No focal consolidation identified in the lungs. No pneumothorax. Musculoskeletal: No chest wall mass or suspicious bone lesions identified. CT ABDOMEN PELVIS FINDINGS Hepatobiliary: Liver is upper normal size with no suspicious mass identified. Gallbladder is mildly distended and contains dependent calculi. 1.4 cm round density in the distal common bile duct consistent with choledocholithiasis. The common bile duct is dilated measuring 1.8 cm in diameter. Mild intrahepatic ductal dilatation. Fat stranding in the right upper quadrant. Pancreas: Mild pancreatic and peripancreatic inflammatory fat stranding changes centered at the pancreatic head. No mass or ductal dilatation appreciated. Spleen: Spleen is enlarged measuring 16 cm in length. Adrenals/Urinary Tract: Adrenal glands appear normal. Kidneys are atrophic with multiple cysts bilaterally measuring up to 4 cm on the left. No hydronephrosis visualized. Urinary bladder incompletely visualized with appearance of mild wall thickening. Stomach/Bowel: No bowel obstruction, free air or pneumatosis. Mild wall thickening of the duodenum, likely reactive from adjacent inflammatory changes. Colonic diverticulosis. Appendix is normal. Vascular/Lymphatic: Severe calcified atherosclerotic plaques. No bulky lymphadenopathy identified. Multiple small retroperitoneal and upper abdominal lymph nodes are noted which are nonspecific. Reproductive: Prostate gland not well visualized. Other: Trace ascites. Musculoskeletal: Degenerative changes of the spine. Bilateral hip prosthesis. No suspicious bony lesions identified. IMPRESSION: 1. Evidence of acute pancreatitis. 2. Choledocholithiasis including a 1.4 cm  calculus in the distal common bile duct. Biliary ductal dilatation suggesting obstruction. Consider ERCP. 3. Cholelithiasis with pericholecystic edema which may represent acute cholecystitis or reactive changes. 4. Colonic diverticulosis. 5. Splenomegaly. 6. Cardiomegaly with small pericardial effusion. 7. Small pleural effusions. 8. Coronary artery disease and atherosclerotic disease. Evidence of pulmonary artery hypertension. 9. Additional chronic findings as described. Electronically Signed   By: Ofilia Neas M.D.   On: 01/31/2021 13:58    Assessment and plan- Patient is a 74 y.o. male with multiple comorbidities admitted for hypotension and found to have choledocholithiasis without overt concern for acute cholangitis.  Hematology consulted for thrombocytopenia  Patient has baseline thrombocytopenia with a platelet count in the 100s which has now drifted down to 48.  We will check smear review, LDH PT PTT INR B12 folate hepatitis testing as well as immature platelet fraction.  If immature platelet fraction is high we could consider treating her for possible ITP with high-dose steroids.  However I would like to defer on giving high-dose steroids at this timeGiven that she presented with biliary pancreatitis and a lipase of 1021.  If patient needs an ERCP in 1 to 2 days time it would be okay to proceed with it and give him a unit of platelet transfusion right before the procedure and 1 unit during the procedure.  I would like to see her as an outpatient and if platelet  comes remain less than 50 we could consider giving her steroids with or without IVIG for possible ITP to bring her platelet counts to more than 50 in anticipation of definitive sphincterotomy which would likely be done in 1 to 2 months time.  Anemia: Possibly secondary to kidney disease.  I will order anemia work-up as well.    Visit Diagnosis 1. Choledocholithiasis   2. Pain     Dr. Randa Evens, MD, MPH [facility] [phone] 02/01/2021 9:16 AM

## 2021-02-02 ENCOUNTER — Encounter: Payer: Self-pay | Admitting: Family Medicine

## 2021-02-02 ENCOUNTER — Inpatient Hospital Stay: Payer: Medicare Other

## 2021-02-02 DIAGNOSIS — D649 Anemia, unspecified: Secondary | ICD-10-CM

## 2021-02-02 DIAGNOSIS — N186 End stage renal disease: Secondary | ICD-10-CM | POA: Diagnosis not present

## 2021-02-02 DIAGNOSIS — I4891 Unspecified atrial fibrillation: Secondary | ICD-10-CM

## 2021-02-02 DIAGNOSIS — K921 Melena: Secondary | ICD-10-CM | POA: Diagnosis not present

## 2021-02-02 DIAGNOSIS — R1011 Right upper quadrant pain: Secondary | ICD-10-CM | POA: Diagnosis not present

## 2021-02-02 DIAGNOSIS — D696 Thrombocytopenia, unspecified: Secondary | ICD-10-CM | POA: Diagnosis not present

## 2021-02-02 DIAGNOSIS — K8050 Calculus of bile duct without cholangitis or cholecystitis without obstruction: Secondary | ICD-10-CM | POA: Diagnosis not present

## 2021-02-02 DIAGNOSIS — T148XXA Other injury of unspecified body region, initial encounter: Secondary | ICD-10-CM

## 2021-02-02 LAB — IRON AND TIBC
Iron: 23 ug/dL — ABNORMAL LOW (ref 45–182)
Saturation Ratios: 20 % (ref 17.9–39.5)
TIBC: 118 ug/dL — ABNORMAL LOW (ref 250–450)
UIBC: 95 ug/dL

## 2021-02-02 LAB — CBC WITH DIFFERENTIAL/PLATELET
Abs Immature Granulocytes: 0.02 10*3/uL (ref 0.00–0.07)
Basophils Absolute: 0 10*3/uL (ref 0.0–0.1)
Basophils Relative: 0 %
Eosinophils Absolute: 0.1 10*3/uL (ref 0.0–0.5)
Eosinophils Relative: 2 %
HCT: 31.4 % — ABNORMAL LOW (ref 39.0–52.0)
Hemoglobin: 10.1 g/dL — ABNORMAL LOW (ref 13.0–17.0)
Immature Granulocytes: 0 %
Lymphocytes Relative: 10 %
Lymphs Abs: 0.5 10*3/uL — ABNORMAL LOW (ref 0.7–4.0)
MCH: 30.2 pg (ref 26.0–34.0)
MCHC: 32.2 g/dL (ref 30.0–36.0)
MCV: 94 fL (ref 80.0–100.0)
Monocytes Absolute: 0.1 10*3/uL (ref 0.1–1.0)
Monocytes Relative: 3 %
Neutro Abs: 3.9 10*3/uL (ref 1.7–7.7)
Neutrophils Relative %: 85 %
Platelets: 41 10*3/uL — ABNORMAL LOW (ref 150–400)
RBC: 3.34 MIL/uL — ABNORMAL LOW (ref 4.22–5.81)
RDW: 16.9 % — ABNORMAL HIGH (ref 11.5–15.5)
WBC: 4.6 10*3/uL (ref 4.0–10.5)
nRBC: 0 % (ref 0.0–0.2)

## 2021-02-02 LAB — COMPREHENSIVE METABOLIC PANEL
ALT: 38 U/L (ref 0–44)
AST: 45 U/L — ABNORMAL HIGH (ref 15–41)
Albumin: 2.9 g/dL — ABNORMAL LOW (ref 3.5–5.0)
Alkaline Phosphatase: 457 U/L — ABNORMAL HIGH (ref 38–126)
Anion gap: 11 (ref 5–15)
BUN: 47 mg/dL — ABNORMAL HIGH (ref 8–23)
CO2: 25 mmol/L (ref 22–32)
Calcium: 8.7 mg/dL — ABNORMAL LOW (ref 8.9–10.3)
Chloride: 100 mmol/L (ref 98–111)
Creatinine, Ser: 5.06 mg/dL — ABNORMAL HIGH (ref 0.61–1.24)
GFR, Estimated: 11 mL/min — ABNORMAL LOW (ref >60)
Glucose, Bld: 85 mg/dL (ref 70–99)
Potassium: 4.4 mmol/L (ref 3.5–5.1)
Sodium: 136 mmol/L (ref 135–145)
Total Bilirubin: 3.8 mg/dL — ABNORMAL HIGH (ref 0.3–1.2)
Total Protein: 5.5 g/dL — ABNORMAL LOW (ref 6.5–8.1)

## 2021-02-02 LAB — BASIC METABOLIC PANEL
Anion gap: 11 (ref 5–15)
Anion gap: 11 (ref 5–15)
BUN: 36 mg/dL — ABNORMAL HIGH (ref 8–23)
BUN: 42 mg/dL — ABNORMAL HIGH (ref 8–23)
CO2: 26 mmol/L (ref 22–32)
CO2: 26 mmol/L (ref 22–32)
Calcium: 8.4 mg/dL — ABNORMAL LOW (ref 8.9–10.3)
Calcium: 8.7 mg/dL — ABNORMAL LOW (ref 8.9–10.3)
Chloride: 96 mmol/L — ABNORMAL LOW (ref 98–111)
Chloride: 98 mmol/L (ref 98–111)
Creatinine, Ser: 4.52 mg/dL — ABNORMAL HIGH (ref 0.61–1.24)
Creatinine, Ser: 4.75 mg/dL — ABNORMAL HIGH (ref 0.61–1.24)
GFR, Estimated: 12 mL/min — ABNORMAL LOW (ref >60)
GFR, Estimated: 13 mL/min — ABNORMAL LOW (ref >60)
Glucose, Bld: 87 mg/dL (ref 70–99)
Glucose, Bld: 87 mg/dL (ref 70–99)
Potassium: 3.9 mmol/L (ref 3.5–5.1)
Potassium: 4.3 mmol/L (ref 3.5–5.1)
Sodium: 133 mmol/L — ABNORMAL LOW (ref 135–145)
Sodium: 135 mmol/L (ref 135–145)

## 2021-02-02 LAB — RETICULOCYTES
Immature Retic Fract: 15.8 % (ref 2.3–15.9)
RBC.: 2.25 MIL/uL — ABNORMAL LOW (ref 4.22–5.81)
Retic Count, Absolute: 29 10*3/uL (ref 19.0–186.0)
Retic Ct Pct: 1.3 % (ref 0.4–3.1)

## 2021-02-02 LAB — HEPATIC FUNCTION PANEL
ALT: 32 U/L (ref 0–44)
AST: 40 U/L (ref 15–41)
Albumin: 2.8 g/dL — ABNORMAL LOW (ref 3.5–5.0)
Alkaline Phosphatase: 423 U/L — ABNORMAL HIGH (ref 38–126)
Bilirubin, Direct: 2.1 mg/dL — ABNORMAL HIGH (ref 0.0–0.2)
Indirect Bilirubin: 1.5 mg/dL — ABNORMAL HIGH (ref 0.3–0.9)
Total Bilirubin: 3.6 mg/dL — ABNORMAL HIGH (ref 0.3–1.2)
Total Protein: 5.3 g/dL — ABNORMAL LOW (ref 6.5–8.1)

## 2021-02-02 LAB — GLUCOSE, CAPILLARY
Glucose-Capillary: 65 mg/dL — ABNORMAL LOW (ref 70–99)
Glucose-Capillary: 126 mg/dL — ABNORMAL HIGH (ref 70–99)
Glucose-Capillary: 89 mg/dL (ref 70–99)
Glucose-Capillary: 68 mg/dL — ABNORMAL LOW (ref 70–99)
Glucose-Capillary: 106 mg/dL — ABNORMAL HIGH (ref 70–99)

## 2021-02-02 LAB — BLOOD GAS, VENOUS
Acid-Base Excess: 4.2 mmol/L — ABNORMAL HIGH (ref 0.0–2.0)
Bicarbonate: 29.7 mmol/L — ABNORMAL HIGH (ref 20.0–28.0)
O2 Saturation: 83.4 %
Patient temperature: 37
pCO2, Ven: 48 mmHg (ref 44.0–60.0)
pH, Ven: 7.4 (ref 7.250–7.430)
pO2, Ven: 48 mmHg — ABNORMAL HIGH (ref 32.0–45.0)

## 2021-02-02 LAB — ABO/RH: ABO/RH(D): O POS

## 2021-02-02 LAB — APTT: aPTT: 42 seconds — ABNORMAL HIGH (ref 24–36)

## 2021-02-02 LAB — MRSA NEXT GEN BY PCR, NASAL: MRSA by PCR Next Gen: NOT DETECTED

## 2021-02-02 LAB — PREPARE RBC (CROSSMATCH)

## 2021-02-02 LAB — PROTIME-INR
INR: 1.9 — ABNORMAL HIGH (ref 0.8–1.2)
Prothrombin Time: 21.9 seconds — ABNORMAL HIGH (ref 11.4–15.2)

## 2021-02-02 LAB — AMMONIA: Ammonia: 29 umol/L (ref 9–35)

## 2021-02-02 LAB — CBC
HCT: 21.7 % — ABNORMAL LOW (ref 39.0–52.0)
Hemoglobin: 6.9 g/dL — ABNORMAL LOW (ref 13.0–17.0)
MCH: 30.4 pg (ref 26.0–34.0)
MCHC: 31.8 g/dL (ref 30.0–36.0)
MCV: 95.6 fL (ref 80.0–100.0)
Platelets: 45 10*3/uL — ABNORMAL LOW (ref 150–400)
RBC: 2.27 MIL/uL — ABNORMAL LOW (ref 4.22–5.81)
RDW: 17.3 % — ABNORMAL HIGH (ref 11.5–15.5)
WBC: 4 10*3/uL (ref 4.0–10.5)
nRBC: 0 % (ref 0.0–0.2)

## 2021-02-02 LAB — MAGNESIUM: Magnesium: 1.4 mg/dL — ABNORMAL LOW (ref 1.7–2.4)

## 2021-02-02 LAB — HEPATITIS B SURFACE ANTIBODY,QUALITATIVE: Hep B S Ab: NONREACTIVE

## 2021-02-02 LAB — HEPATITIS B CORE ANTIBODY, TOTAL: Hep B Core Total Ab: NONREACTIVE

## 2021-02-02 LAB — FOLATE: Folate: 10.6 ng/mL (ref >5.9)

## 2021-02-02 LAB — HEPATITIS C ANTIBODY: HCV Ab: NONREACTIVE

## 2021-02-02 LAB — FERRITIN: Ferritin: 1707 ng/mL — ABNORMAL HIGH (ref 24–336)

## 2021-02-02 LAB — IMMATURE PLATELET FRACTION: Immature Platelet Fraction: 12.6 % — ABNORMAL HIGH (ref 1.2–8.6)

## 2021-02-02 LAB — VITAMIN B12: Vitamin B-12: 2319 pg/mL — ABNORMAL HIGH (ref 180–914)

## 2021-02-02 LAB — LACTIC ACID, PLASMA
Lactic Acid, Venous: 1.7 mmol/L (ref 0.5–1.9)
Lactic Acid, Venous: 2.1 mmol/L (ref 0.5–1.9)

## 2021-02-02 LAB — TROPONIN I (HIGH SENSITIVITY)
Troponin I (High Sensitivity): 356 ng/L (ref <18)
Troponin I (High Sensitivity): 397 ng/L (ref <18)

## 2021-02-02 LAB — HEPATITIS B SURFACE ANTIBODY, QUANTITATIVE: Hep B S AB Quant (Post): <3.1 m[IU]/mL — ABNORMAL LOW (ref >9.9)

## 2021-02-02 LAB — PROCALCITONIN: Procalcitonin: 21.06 ng/mL

## 2021-02-02 LAB — LACTATE DEHYDROGENASE: LDH: 88 U/L — ABNORMAL LOW (ref 98–192)

## 2021-02-02 MED ORDER — SEVELAMER CARBONATE 800 MG PO TABS
1600.0000 mg | ORAL_TABLET | ORAL | Status: DC
  Administered 2021-02-03 – 2021-02-08 (×10): 1600 mg via ORAL
  Filled 2021-02-02 (×9): qty 2

## 2021-02-02 MED ORDER — DEXTROSE 50 % IV SOLN
12.5000 g | INTRAVENOUS | Status: AC
  Administered 2021-02-02: 12.5 g via INTRAVENOUS

## 2021-02-02 MED ORDER — AMIODARONE HCL IN DEXTROSE 360-4.14 MG/200ML-% IV SOLN
60.0000 mg/h | INTRAVENOUS | Status: DC
  Administered 2021-02-02: 60 mg/h via INTRAVENOUS

## 2021-02-02 MED ORDER — MAGNESIUM SULFATE IN D5W 1-5 GM/100ML-% IV SOLN
1.0000 g | Freq: Once | INTRAVENOUS | Status: AC
  Administered 2021-02-02: 1 g via INTRAVENOUS
  Filled 2021-02-02: qty 100

## 2021-02-02 MED ORDER — PIPERACILLIN-TAZOBACTAM IN DEX 2-0.25 GM/50ML IV SOLN
2.2500 g | Freq: Three times a day (TID) | INTRAVENOUS | Status: DC
  Administered 2021-02-02 – 2021-02-09 (×21): 2.25 g via INTRAVENOUS
  Filled 2021-02-02 (×27): qty 50

## 2021-02-02 MED ORDER — CHLORHEXIDINE GLUCONATE CLOTH 2 % EX PADS
6.0000 | MEDICATED_PAD | Freq: Every day | CUTANEOUS | Status: DC
  Administered 2021-02-02 – 2021-02-09 (×8): 6 via TOPICAL

## 2021-02-02 MED ORDER — SODIUM CHLORIDE 0.9% IV SOLUTION: Freq: Once | INTRAVENOUS | Status: DC

## 2021-02-02 MED ORDER — PANTOPRAZOLE SODIUM 40 MG IV SOLR
40.0000 mg | Freq: Two times a day (BID) | INTRAVENOUS | Status: DC
  Administered 2021-02-02 – 2021-02-09 (×15): 40 mg via INTRAVENOUS
  Filled 2021-02-02 (×14): qty 40

## 2021-02-02 MED ORDER — MAGNESIUM SULFATE 2 GM/50ML IV SOLN: 2.0000 g | Freq: Once | INTRAVENOUS | Status: DC

## 2021-02-02 MED ORDER — SODIUM CHLORIDE 0.9 % IV SOLN: 1.0000 g | Freq: Two times a day (BID) | INTRAVENOUS | Status: DC

## 2021-02-02 MED ORDER — AMIODARONE HCL IN DEXTROSE 360-4.14 MG/200ML-% IV SOLN
30.0000 mg/h | INTRAVENOUS | Status: DC
  Administered 2021-02-03 – 2021-02-08 (×8): 30 mg/h via INTRAVENOUS
  Filled 2021-02-02 (×13): qty 200

## 2021-02-02 MED ORDER — DEXTROSE 50 % IV SOLN
INTRAVENOUS | Status: AC
  Filled 2021-02-02: qty 50

## 2021-02-02 MED ORDER — AMIODARONE LOAD VIA INFUSION
150.0000 mg | Freq: Once | INTRAVENOUS | Status: AC
  Administered 2021-02-02: 150 mg via INTRAVENOUS
  Filled 2021-02-02: qty 83.34

## 2021-02-02 MED ORDER — EPOETIN ALFA 10000 UNIT/ML IJ SOLN
10000.0000 [IU] | INTRAMUSCULAR | Status: DC
  Administered 2021-02-05 – 2021-02-09 (×3): 10000 [IU] via INTRAVENOUS
  Filled 2021-02-02 (×3): qty 1

## 2021-02-02 NOTE — Progress Notes (Signed)
Nurse placed tele leads back on patient and he pulled them off as soon as they were replaced. Patient placed back on standby this has been given the ok by provider Dr. Tobie Poet.

## 2021-02-02 NOTE — Progress Notes (Signed)
MD notified at 2245 of patient HR tachy in getting up to the 140's. PRN order placed, but at time order placed pt. Heart rate was less than 120 so not given. Tele called and notified nurse that patient was afib with RVR, MD notified. IV metop held per Dr. Tobie Poet due to BP and re-check of manual femoral HR of 86, but was irregular. Patient alert, but intermittently confused. Tele monitor continues to read HR ranging from 100's-140's, will continue to monitor patient. Per Dr. Tobie Poet follow manual HR. EKG completed and MD notified.

## 2021-02-02 NOTE — Progress Notes (Signed)
Progress Note  Patient Name: Antonio Phillips Date of Encounter: 02/02/2021  [facility] Cardiologist: Ida Rogue, MD   Subjective   Anticoagulation held for ERCP, which is planned for today. Hgb 6.9 and he is receiving 2untis PRBCs. BP very low. Patient lethargic during exam. Afib rates elevated up to 140s.   Inpatient Medications    Scheduled Meds:  sodium chloride   Intravenous Once   aspirin EC  81 mg Oral Daily   atorvastatin  40 mg Oral QHS   Chlorhexidine Gluconate Cloth  6 each Topical Q0600   clotrimazole  1 application Topical BID   diclofenac Sodium  2 g Topical q AM   [START ON 02/04/2021] epoetin (EPOGEN/PROCRIT) injection  10,000 Units Intravenous Q T,Th,Sa-HD   finasteride  5 mg Oral Daily   loratadine  10 mg Oral Once per day on Sun Mon Wed Fri   midodrine  10 mg Oral Q T,Th,Sa-HD   midodrine  5 mg Oral Q0600   patiromer  16.8 g Oral Daily   pregabalin  75 mg Oral BID   rOPINIRole  1 mg Oral QHS   sevelamer carbonate  1,600 mg Oral 2 times per day on Tue Thu Sat   [START ON 02/03/2021] sevelamer carbonate  1,600 mg Oral 3 times per day on Sun Mon Wed Fri   vitamin B-12  1,000 mcg Oral Daily   Continuous Infusions:  sodium chloride 50 mL/hr at 02/01/21 2353   sodium chloride     sodium chloride     PRN Meds: sodium chloride, sodium chloride, acetaminophen, alteplase, heparin, HYDROcodone-acetaminophen, lactulose, lidocaine (PF), lidocaine-prilocaine, loperamide, morphine injection, pentafluoroprop-tetrafluoroeth, polyvinyl alcohol, sodium hypochlorite   Vital Signs    Vitals:   02/02/21 0155 02/02/21 0430 02/02/21 0432 02/02/21 0847  BP:  121/76  95/61  Pulse: 89 (!) 133 89 80  Resp:  18  16  Temp:  98.1 F (36.7 C)  98 F (36.7 C)  TempSrc:  Oral  Oral  SpO2:    96%  Weight:      Height:        Intake/Output Summary (Last 24 hours) at 02/02/2021 1132 Last data filed at 02/01/2021 2110 Gross per 24 hour  Intake 0 ml  Output 0 ml   Net 0 ml   Last 3 Weights 02/01/2021 02/01/2021 01/31/2021  Weight (lbs) 218 lb 4.1 oz (No Data) (No Data)  Weight (kg) 99 kg (No Data) (No Data)      Telemetry    Afib HR 100-140s - Personally Reviewed  ECG    Afib RVR, 118bpm, nonspecific ST/T wave changes - Personally Reviewed  Physical Exam   GEN: No acute distress.   Neck: No JVD Cardiac: Irreg Irreg, tachycardic, no murmurs, rubs, or gallops.  Respiratory: Clear to auscultation bilaterally. GI: Soft, nontender, non-distended  MS: No edema; No deformity. Neuro:  Nonfocal  Psych: Normal affect   Labs    High Sensitivity Troponin:   Recent Labs  Lab 01/31/21 0909 01/31/21 1338  TROPONINIHS 95* 101*     Chemistry Recent Labs  Lab 01/31/21 0909 01/31/21 2030 02/01/21 0618 02/02/21 0030 02/02/21 0647  NA 138  --  136 133* 135  K 5.7*   < > 5.8* 3.9 4.3  CL 101  --  100 96* 98  CO2 20*  --  23 26 26   GLUCOSE 76  --  78 87 87  BUN 89*  --  77* 36* 42*  CREATININE 9.75*  --  8.12* 4.52* 4.75*  CALCIUM 8.2*  --  8.4* 8.7* 8.4*  MG  --   --   --  1.4*  --   PROT 5.7*  --  5.2*  --  5.3*  ALBUMIN 2.9*  --  2.8*  --  2.8*  AST 67*  --  51*  --  40  ALT 44  --  42  --  32  ALKPHOS 387*  --  332*  --  423*  BILITOT 4.2*  --  3.3*  --  3.6*  GFRNONAA 5*  --  6* 13* 12*  ANIONGAP 17*  --  13 11 11    < > = values in this interval not displayed.    Lipids No results for input(s): CHOL, TRIG, HDL, LABVLDL, LDLCALC, CHOLHDL in the last 168 hours.  Hematology Recent Labs  Lab 01/31/21 0909 02/01/21 0618 02/02/21 0647  WBC 7.4 5.1 4.0  RBC 2.82* 2.47* 2.27*  2.25*  HGB 8.5* 7.5* 6.9*  HCT 26.9* 23.7* 21.7*  MCV 95.4 96.0 95.6  MCH 30.1 30.4 30.4  MCHC 31.6 31.6 31.8  RDW 17.4* 17.3* 17.3*  PLT 62* 48* 45*   Thyroid No results for input(s): TSH, FREET4 in the last 168 hours.  BNP Recent Labs  Lab 01/31/21 0909  BNP 1,051.9*    DDimer No results for input(s): DDIMER in the last 168 hours.    Radiology    CT CHEST ABDOMEN PELVIS WO CONTRAST  Result Date: 01/31/2021 CLINICAL DATA:  Chest and abdominal pain EXAM: CT CHEST, ABDOMEN AND PELVIS WITHOUT CONTRAST TECHNIQUE: Multidetector CT imaging of the chest, abdomen and pelvis was performed following the standard protocol without IV contrast. COMPARISON:  CT chest 09/20/2020 FINDINGS: CT CHEST FINDINGS Cardiovascular: Heart is enlarged. Small pericardial effusion. Extensive coronary artery calcifications. Mildly dilated pulmonary artery measuring 3.1 cm diameter. Thoracic aorta is tortuous and normal caliber with moderate calcified plaques. Mediastinum/Nodes: Previous right thyroidectomy changes. No bulky lymphadenopathy identified. Mildly enlarged right pretracheal lymph node measuring 11 mm. Lungs/Pleura: Trace bilateral pleural effusions right greater than left. No focal consolidation identified in the lungs. No pneumothorax. Musculoskeletal: No chest wall mass or suspicious bone lesions identified. CT ABDOMEN PELVIS FINDINGS Hepatobiliary: Liver is upper normal size with no suspicious mass identified. Gallbladder is mildly distended and contains dependent calculi. 1.4 cm round density in the distal common bile duct consistent with choledocholithiasis. The common bile duct is dilated measuring 1.8 cm in diameter. Mild intrahepatic ductal dilatation. Fat stranding in the right upper quadrant. Pancreas: Mild pancreatic and peripancreatic inflammatory fat stranding changes centered at the pancreatic head. No mass or ductal dilatation appreciated. Spleen: Spleen is enlarged measuring 16 cm in length. Adrenals/Urinary Tract: Adrenal glands appear normal. Kidneys are atrophic with multiple cysts bilaterally measuring up to 4 cm on the left. No hydronephrosis visualized. Urinary bladder incompletely visualized with appearance of mild wall thickening. Stomach/Bowel: No bowel obstruction, free air or pneumatosis. Mild wall thickening of the duodenum,  likely reactive from adjacent inflammatory changes. Colonic diverticulosis. Appendix is normal. Vascular/Lymphatic: Severe calcified atherosclerotic plaques. No bulky lymphadenopathy identified. Multiple small retroperitoneal and upper abdominal lymph nodes are noted which are nonspecific. Reproductive: Prostate gland not well visualized. Other: Trace ascites. Musculoskeletal: Degenerative changes of the spine. Bilateral hip prosthesis. No suspicious bony lesions identified. IMPRESSION: 1. Evidence of acute pancreatitis. 2. Choledocholithiasis including a 1.4 cm calculus in the distal common bile duct. Biliary ductal dilatation suggesting obstruction. Consider ERCP. 3. Cholelithiasis with pericholecystic edema which may  represent acute cholecystitis or reactive changes. 4. Colonic diverticulosis. 5. Splenomegaly. 6. Cardiomegaly with small pericardial effusion. 7. Small pleural effusions. 8. Coronary artery disease and atherosclerotic disease. Evidence of pulmonary artery hypertension. 9. Additional chronic findings as described. Electronically Signed   By: Ofilia Neas M.D.   On: 01/31/2021 13:58    Cardiac Studies   Limited 2D echo with Definity 09/28/2020: 1. Left ventricular ejection fraction, by estimation, is 45 to 50%. The  left ventricle has mildly decreased function. Left ventricular endocardial  border not optimally defined to evaluate regional wall motion. There is  mild left ventricular hypertrophy.   2. Right ventricular systolic function is moderately reduced. The right  ventricular size is normal. Mildly increased right ventricular wall  thickness. There is moderately elevated pulmonary artery systolic  pressure.   3. The mitral valve is abnormal. Trivial mitral valve regurgitation.   4. The aortic valve is tricuspid. There is mild thickening of the aortic  valve.   5. The inferior vena cava is dilated in size with <50% respiratory  variability, suggesting right atrial pressure of  15 mmHg.  __________   2D echo 09/27/2020:  1. Poor acoustic windows Endocardium is not well seen I would recomm  limited echo with Definity to help define LVEF and regional wall motion. .  There is mild left ventricular hypertrophy. Left ventricular diastolic  parameters are indeterminate.   2. RV is not seen well enough to evaluate function. . The right  ventricular size is mildly enlarged.   3. Left atrial size was severely dilated.   4. Right atrial size was severely dilated.   5. Mild mitral valve regurgitation.   6. Tricuspid valve regurgitation is mild to moderate.   7. The aortic valve is tricuspid. Aortic valve regurgitation is not  visualized. Mild aortic valve sclerosis is present, with no evidence of  aortic valve stenosis.  __________   LHC 09/22/2020:   Ost LAD to Prox LAD lesion is 50% stenosed.   2nd Mrg lesion is 99% stenosed.   A drug-eluting stent was successfully placed using a STENT RESOLUTE ONYX 2.5X15.   Post intervention, there is a 30% residual stenosis.   1.  Severe single-vessel coronary artery disease with 99% subtotal stenosis of the second obtuse marginal branch, treated with a 2.5 x 15 mm resolute Onyx DES.  30% residual stenosis at the case completion due to resistant/calcified lesion type even with high-pressure noncompliant balloon angioplasty. 2.  Mild to moderate nonobstructive proximal LAD stenosis 3.  Mild nonobstructive RCA stenosis  Recommendations: Aggressive medical therapy, as long as no bleeding complications arise, would resume apixaban tomorrow, aspirin 81 mg daily x30 days, and clopidogrel 75 mg daily x minimum 6 months. ___________   2D echo 09/21/2020: 1. Left ventricular ejection fraction, by estimation, is 60 to 65%. The  left ventricle has normal function. The left ventricle has no regional  wall motion abnormalities. Left ventricular diastolic parameters are  indeterminate.   2. Right ventricular systolic function is normal. The  right ventricular  size is normal. Tricuspid regurgitation signal is inadequate for assessing  PA pressure.   3. Left atrial size was moderately dilated.   4. Right atrial size was moderately dilated.   5. Tricuspid valve regurgitation is mild to moderate.   6. The mitral valve is normal in structure. Mild mitral valve  regurgitation.   7. Rhythym is atrial fibrillation   Patient Profile     75 y.o. 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  Assessment & Plan    Abdominal pain Choledocolithiasis Biliary pancreatitis - presented with abdominal pain - lipase elevated to 1000 - CT abd pelvis with evidence of acute pancreatitis as well as cholelithiasis including a 1.4cm calculus in the distal CBD, bilary ductal dilation suggestive of obstruction - GI consulted and plan on ERCP - patient was recently stented 09/22/20 to 2nd Mrg, recommended Plavix x 6 months. Per MD ok to hold Plavix and Eliquis for upcoming procedure.    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 Restart when able as afib rates elevated.    Hypotension - PTA midodrine 10mg  daily T, TH, Sat and midodrine 5mg  daily - PTA coreg held.     Elevated troponin - 95>101 - suspect demand ischemia in the setting of the above  issues - No chest pain reported - No further ischemic work-up   DM2 - SSI per IM   Cardiac pre-op eval - No acute cardiac symptoms - No prior work-up prior to procedure - Spoke with MD, Ok to hold Eliquis and Plavix, cover with ASA 81mg  perioperatively. Restart Eliquis and Plavix as soon as possible post-procedure.  - METS<4 he is b/l amputee - According to the Revised cardiac index he is Class IV risk at 15% 30 days risk of death, MI, or cardiac arrest  For questions or updates, please contact [place] Please consult [email or web address] for contact info under        Signed, Venetia Prewitt Ninfa Meeker, PA-C  02/02/2021, 11:32 AM

## 2021-02-02 NOTE — TOC Initial Note (Signed)
Transition of Care [facility]) - Initial/Assessment Note    Patient Details  Name: Antonio Phillips MRN: [id number] Date of Birth: 1945/03/15  [facility] CM/SW Contact:    Beverly Sessions, RN Phone Number: 02/02/2021, 12:26 PM  Clinical Narrative:                  Patient admitted from [place] Deferred Extreme risk assessment at this time as patient has been agitated with staff and making inappropriate comments        Patient Goals and CMS Choice        Expected Discharge Plan and Services                                                Prior Living Arrangements/Services                       Activities of Daily Living Home Assistive Devices/Equipment:  (UTA pt. becoming confused and unsure) ADL Screening (condition at time of admission) Patient's cognitive ability adequate to safely complete daily activities?: No Is the patient deaf or have difficulty hearing?: No Does the patient have difficulty seeing, even when wearing glasses/contacts?: No Does the patient have difficulty concentrating, remembering, or making decisions?: Yes Patient able to express need for assistance with ADLs?: Yes Does the patient have difficulty dressing or bathing?: Yes Independently performs ADLs?: No Communication: Independent Dressing (OT): Needs assistance Is this a change from baseline?: Pre-admission baseline Grooming: Needs assistance Is this a change from baseline?: Pre-admission baseline Feeding: Needs assistance Is this a change from baseline?: Pre-admission baseline Bathing: Dependent Is this a change from baseline?: Pre-admission baseline Toileting: Dependent Is this a change from baseline?: Pre-admission baseline In/Out Bed: Dependent Is this a change from baseline?: Pre-admission baseline Walks in Home: Dependent Is this a change from baseline?: Pre-admission baseline Does the patient have difficulty walking or climbing  stairs?: Yes Weakness of Legs: Both Weakness of Arms/Hands: Both  Permission Sought/Granted                  Emotional Assessment              Admission diagnosis:  Choledocholithiasis [K80.50] Pain [R52] Patient Active Problem List   Diagnosis Date Noted   Normocytic anemia    Preop cardiovascular exam    Abdominal pain 01/31/2021   Choledocholithiasis 01/31/2021   Morbid obesity ([place])    Sepsis due to Escherichia coli ([place])    Wound of skin    Chronic hypotension    Demand ischemia ([place])    Permanent atrial fibrillation ([place])    Chest pain 09/26/2020   PVD (peripheral vascular disease) ([place])    Shortness of breath 09/20/2020   NSTEMI (non-ST elevated myocardial infarction) ([place]) 09/20/2020   Fungal skin infection    Atrial fibrillation, chronic (HCC)    Acquired thrombophilia ([place])    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 [place] [address] Phone: [phone] Fax: [phone]     Social Determinants of Health (SDOH) Interventions    Readmission Risk Interventions Readmission Risk Prevention Plan 09/10/2019 11/14/2018 11/09/2018  Transportation Screening Complete Complete Complete  PCP or Specialist Appt within 3-5 Days Patient refused Patient refused -  [place] or [place] Patient refused - -  Social Work Consult for [place] Planning/Counseling [place] Screening Not Applicable Not Applicable -  Medication Review (RN Care Manager)  Complete Complete Complete  Some recent data might be hidden

## 2021-02-02 NOTE — Consult Note (Addendum)
NAME:  Antonio Phillips, MRN:  [id number], DOB:  [birth year], LOS: 2 ADMISSION DATE:  01/31/2021, CONSULTATION DATE:  02/02/2021 REFERRING MD:  Dr. Phineas Inches, CHIEF COMPLAINT:  Hypotension   Brief Pt Description / Synopsis/HPI  75 y.o. Male with PMH of ESRD on Hemodialysis, CAD, chronic hypotension on Midodrine,  A. Fib on Eliquis, and PAD s/p bilateral amputations admitted with Acute Biliary Pancreatitis secondary to Choledocholithiasis, Multifactorial shock (Hypovolemic/Hemorrhagic vs. Septic), and Acute GI bleed.  GI consulted, plan for ERCP.  Patient had rapid response called for low BP(nl BP in the SBP's 80's) Patient seen in ICU 4, alert and awake, following commands  Pertinent  Medical History  ESRD on Hemodialysis Chronic Hypotension on Midodrine CAD PAD Permanent Atrial Fibrillation on Eliquis Morbid Obesity Hyperlipidemia Diabetes Mellitus  Micro Data:  01/31/21: SARS-CoV-2 & Influenza PCR>>negative 02/02/21: MRSA PCR>>negative  Antimicrobials:  Zosyn 11/29>>  Significant Hospital Events: Including procedures, antibiotic start and stop dates in addition to other pertinent events   11/29 admitted to [facility] for MS changes    Objective   Blood pressure (!) 79/53, pulse 80, temperature 98.5 F (36.9 C), temperature source Oral, resp. rate 16, height 5\' 11"  (1.803 m), weight 99 kg, SpO2 97 %.        Intake/Output Summary (Last 24 hours) at 02/02/2021 1533 Last data filed at 02/02/2021 1430 Gross per 24 hour  Intake 640 ml  Output 0 ml  Net 640 ml   Filed Weights   01/31/21 0904 02/01/21 2052  Weight: 97.6 kg 99 kg    Examination: General: alert and awake, NAD HENT: [place]/AT, PERRLA Lungs: CTA b/l no wheezes Cardiovascular: s1 s2 Abdomen: soft distended Extremities: BKA b/l  Neuro: alert and awake     Assessment & Plan:   Multifactorial Shock: Hypovolemic/Hemorrhagic vs. Septic PMHx: chronic Hypotension requiring Midodrine, CAD with severe single  disease, Atrial fibrillation on Eliquis -Continuous cardiac monitoring -Maintain MAP >65 -Cautious IVF given ESRD on HD -Transfusions as indicated (to receive 2 units pRBCs on 11/29) -Vasopressors as needed to maintain MAP goal -Trend lactic acid until normalized -Trend HS Troponin until peaked -Consider Echocardiogram  -Hold anticoagulation (Eliquis), ASA, Plavix due to acute GI bleed  Acute Biliary Pancreatitis secondary to Choledocholithiasis Acute GI Bleed -GI Following, appreciate input -Plan for ERCP -IV Protonix 40 mg BID -Will start empiric Zosyn for now given acute decompensation  Acute Blood Loss Anemia superimposed on Anemia of chronic Disease -Monitor for S/Sx of bleeding -Trend CBC -SCD's for VTE Prophylaxis (no chemical prophylaxis) -Transfuse for Hgb <8  ESRD on Hemodialysis -Monitor I&O's / urinary output -Follow BMP -Ensure adequate renal perfusion -Avoid nephrotoxic agents as able -Replace electrolytes as indicated -Nephrology following, appreciate input    Best Practice (right click and "Reselect all SmartList Selections" daily)   Diet/type: NPO DVT prophylaxis: SCD GI prophylaxis: PPI Lines: N/A Foley:  N/A Code Status:  full code Last date of multidisciplinary goals of care discussion [N/A]  Labs   CBC: Recent Labs  Lab 01/31/21 0909 02/01/21 0618 02/02/21 0647  WBC 7.4 5.1 4.0  NEUTROABS 6.0  --   --   HGB 8.5* 7.5* 6.9*  HCT 26.9* 23.7* 21.7*  MCV 95.4 96.0 95.6  PLT 62* 48* 45*    Basic Metabolic Panel: Recent Labs  Lab 01/31/21 0909 01/31/21 2030 02/01/21 0618 02/02/21 0030 02/02/21 0647  NA 138  --  136 133* 135  K 5.7* 4.8 5.8* 3.9 4.3  CL 101  --  100 96*  98  CO2 20*  --  23 26 26   GLUCOSE 76  --  78 87 87  BUN 89*  --  77* 36* 42*  CREATININE 9.75*  --  8.12* 4.52* 4.75*  CALCIUM 8.2*  --  8.4* 8.7* 8.4*  MG  --   --   --  1.4*  --   PHOS  --   --  8.0*  --   --    GFR: Estimated Creatinine Clearance: 15.9  mL/min (A) (by C-G formula based on SCr of 4.75 mg/dL (H)). Recent Labs  Lab 01/31/21 0909 02/01/21 0618 02/02/21 0647  WBC 7.4 5.1 4.0    Liver Function Tests: Recent Labs  Lab 01/31/21 0909 02/01/21 0618 02/02/21 0647  AST 67* 51* 40  ALT 44 42 32  ALKPHOS 387* 332* 423*  BILITOT 4.2* 3.3* 3.6*  PROT 5.7* 5.2* 5.3*  ALBUMIN 2.9* 2.8* 2.8*   Recent Labs  Lab 01/31/21 0909  LIPASE 1,021*   Recent Labs  Lab 02/02/21 0030  AMMONIA 29    ABG    Component Value Date/Time   PHART 7.43 09/09/2019 2259   PCO2ART 35 09/09/2019 2259   PO2ART 148 (H) 09/09/2019 2259   HCO3 23.2 09/09/2019 2259   ACIDBASEDEF 0.9 09/09/2019 2259   O2SAT 99.3 09/09/2019 2259     Coagulation Profile: Recent Labs  Lab 02/02/21 0647  INR 1.9*    Cardiac Enzymes: No results for input(s): CKTOTAL, CKMB, CKMBINDEX, TROPONINI in the last 168 hours.  HbA1C: Hgb A1c MFr Bld  Date/Time Value Ref Range Status  11/10/2018 11:06 AM 5.5 4.8 - 5.6 % Final    Comment:    (NOTE) Pre diabetes:          5.7%-6.4% Diabetes:              >6.4% Glycemic control for   <7.0% adults with diabetes     CBG: Recent Labs  Lab 02/02/21 1508 02/02/21 1530  GLUCAP 65* 126*    Review of Systems:   Positives in BOLD: Gen: Denies fever, chills, weight change, fatigue, night sweats HEENT: Denies blurred vision, double vision, hearing loss, tinnitus, sinus congestion, rhinorrhea, sore throat, neck stiffness, dysphagia PULM: Denies shortness of breath, cough, sputum production, hemoptysis, wheezing CV: Denies chest pain, edema, orthopnea, paroxysmal nocturnal dyspnea, palpitations GI: Denies abdominal pain, nausea, vomiting, diarrhea, hematochezia, melena, constipation, change in bowel habits GU: Denies dysuria, hematuria, polyuria, oliguria, urethral discharge Endocrine: Denies hot or cold intolerance, polyuria, polyphagia or appetite change Derm: Denies rash, dry skin, scaling or peeling skin  change Heme: Denies easy bruising, bleeding, bleeding gums Neuro: Denies headache, numbness, weakness, slurred speech, loss of memory or consciousness   Past Medical History:  He,  has a past medical history of Demand ischemia ([place]), Diabetes ([place]), ESRD (end stage renal disease) ([place]), Hyperlipidemia, Hypotension, Morbid obesity ([place]), PAD (peripheral artery disease) ([place]), and Permanent atrial fibrillation ([place]).   Surgical History:   Past Surgical History:  Procedure Laterality Date   AV FISTULA REPAIR     BELOW KNEE LEG AMPUTATION Bilateral    CHOLECYSTECTOMY     LEFT HEART CATH AND CORONARY ANGIOGRAPHY N/A 09/22/2020   Procedure: LEFT HEART CATH AND CORONARY ANGIOGRAPHY;  Surgeon: Sherren Mocha, MD;  Location: [facility];  Service: Cardiovascular;  Laterality: N/A;   TOTAL HIP ARTHROPLASTY Bilateral      Social History:   reports that he has never smoked. He has never used smokeless tobacco. He reports  that he does not drink alcohol and does not use drugs.   Family History:  His family history is not on file.   Allergies Allergies  Allergen Reactions   Simvastatin Other (See Comments)     Home Medications  Prior to Admission medications   Medication Sig Start Date End Date Taking? Authorizing Provider  acetaminophen (TYLENOL) 325 MG tablet Take 2 tablets (650 mg total) by mouth every 6 (six) hours as needed for mild pain or moderate pain. 01/17/20  Yes Wieting, Richard, MD  apixaban (ELIQUIS) 5 MG TABS tablet Take 1 tablet (5 mg total) by mouth 2 (two) times daily. 01/17/20  Yes Wieting, Richard, MD  atorvastatin (LIPITOR) 40 MG tablet Take 40 mg by mouth at bedtime. 09/17/20  Yes [provider]  Carboxymethylcellulose Sod PF 0.5 % SOLN Place 1 drop into both eyes 3 (three) times daily as needed (dry eyes).   Yes [provider]  carvedilol (COREG) 3.125 MG tablet Take 1 tablet (3.125 mg total) by mouth 2 (two) times daily with a meal. 01/17/20   Yes Wieting, Richard, MD  ciprofloxacin (CIPRO) 500 MG tablet Take 500 mg by mouth 2 (two) times daily. 01/21/21 02/03/21 Yes [provider]  clopidogrel (PLAVIX) 75 MG tablet Take 1 tablet (75 mg total) by mouth daily with breakfast. 09/23/20  Yes Wieting, Richard, MD  clotrimazole (LOTRIMIN) 1 % cream Apply 1 application topically 2 (two) times daily. (Apply under foreskin of penis) 01/27/21 02/10/21 Yes [provider]  diclofenac Sodium (VOLTAREN) 1 % GEL Apply 2 g topically in the morning. (Apply to right stump)   Yes [provider]  doxycycline (VIBRAMYCIN) 100 MG capsule Take 100 mg by mouth 2 (two) times daily. 01/20/21 02/02/21 Yes [provider]  finasteride (PROSCAR) 5 MG tablet Take 5 mg by mouth daily. 03/13/19  Yes [provider]  HYDROcodone-acetaminophen (NORCO/VICODIN) 5-325 MG tablet Take 1 tablet by mouth every 6 (six) hours as needed for severe pain. 09/23/20  Yes Wieting, Richard, MD  lactulose, encephalopathy, (CHRONULAC) 10 GM/15ML SOLN Take 10 g by mouth 2 (two) times daily as needed (mild constipation).   Yes [provider]  loperamide (IMODIUM A-D) 2 MG tablet Take 2 tablets (4 mg total) by mouth 4 (four) times daily as needed for diarrhea or loose stools. 09/09/19  Yes Carrie Mew, MD  loratadine (CLARITIN) 10 MG tablet Take 10 mg by mouth See admin instructions. Take 10 mg by mouth daily on Monday, Wednesday, Friday and Sunday   Yes [provider]  midodrine (PROAMATINE) 10 MG tablet Take 10 mg by mouth Every Tuesday,Thursday,and Saturday with dialysis.   Yes [provider]  midodrine (PROAMATINE) 5 MG tablet Take 5 mg by mouth daily at 6 (six) AM.   Yes [provider]  pregabalin (LYRICA) 75 MG capsule Take 75 mg by mouth 2 (two) times daily.   Yes [provider]  rOPINIRole (REQUIP) 1 MG tablet Take 1 mg by mouth at bedtime. 03/13/19  Yes [provider]  sevelamer  carbonate (RENVELA) 800 MG tablet Take 1,600 mg by mouth See admin instructions. Take 2 tablets (1600mg ) by mouth three times daily with meals on Monday, Wednesday, Friday and Sunday   Yes [provider]  sevelamer carbonate (RENVELA) 800 MG tablet Take 800 mg by mouth See admin instructions. Take 2 tablets (1600mg ) by mouth twice daily with meals (breakfast and dinner) on Tuesday, Thursday and Saturday   Yes [provider]  sodium hypochlorite (DAKIN'S 1/4 STRENGTH) 0.125 % SOLN Apply 1 application topically See admin instructions. Use as directed after cleansing left stump wound - use twice daily and as needed 01/15/21  Yes [provider]  vitamin B-12 (CYANOCOBALAMIN) 1000 MCG tablet Take 1,000 mcg by mouth daily.   Yes [provider]  mupirocin ointment (BACTROBAN) 2 % Place 1 application into the nose 2 (two) times daily. Patient not taking: Reported on 01/31/2021 09/23/20   Loletha Grayer, MD     MEDICATION ADJUSTMENTS/LABS AND TESTS ORDERED:  PCCM WILL FOLLOW ALONG IN CONSULTATION   Cassaundra Rasch Patricia Pesa, M.D.  [facility]  Medical Director [place] Director [facility] Cardio-Pulmonary Department

## 2021-02-02 NOTE — Progress Notes (Signed)
Rapid response called due to the patients continued low blood pressure and lethargy. Heart rate runs SR to ST. A-fib. CBG 65 during rapid response then came up to 126 after IV dextrose. Dr Phineas Inches at bedside and gave order to transfer to ICU/stepdown. Order also received for x-ray of right wrist. Patient continues to verbalize pain in his right wrist throughout the day

## 2021-02-02 NOTE — Progress Notes (Signed)
Patient refused am dressing to hip. Patient was sleeping when nurse went in to try and he stated "not right now".

## 2021-02-02 NOTE — Progress Notes (Signed)
Patient had a large BM that was brown and maroon in color. Dr Marius Ditch and Dr Phineas Inches are aware

## 2021-02-02 NOTE — Progress Notes (Signed)
Patient keeps waking from his sleep and pulling off his gown and telemetry leads, nurse has replaced leads 5 times.

## 2021-02-02 NOTE — Progress Notes (Signed)
Order received from Dr Marius Ditch to make patient NPO. Patient will be having an ERCP today

## 2021-02-02 NOTE — Progress Notes (Signed)
Patient irritable, cussing at staff. Very sarcastic. Asked if he was going to get a "blow job'. Patient is confused but reminded that this behavior is not appropriate. [place] confirmed that the patient usually signes for consent himself but they did give me the number for April Baldwin who is listed as a friend, [phone]. Order received from Dr Phineas Inches for a wound consult due to wound to bilateral hips

## 2021-02-02 NOTE — Progress Notes (Signed)
Patient ID: Antonio Phillips, male   DOB: Mar 21, 1945, 75 y.o.   MRN: [id number]  Patient continues to remain lethargic.  Status post 1 unit of PRBC.  Second unit running at this time.  Blood pressure remains labile.  Due to persistent lethargy, hypotension, tachycardia, concerns for suspected sepsis.  CT scan from prior did not show any concerns for cholangitis.  Will consider sending lactate and blood cultures.  Empiric antibiotics will be initiated pending cultures.  Patient will be transferred to stepdown unit.  Intensivist consult has been requested to advise and help with management.

## 2021-02-02 NOTE — Consult Note (Signed)
Pharmacy Antibiotic Note  Antonio Phillips is a 75 y.o. male w/ h/o hypotension, obesity, peripheral artery disease, diabetes, hyperlipidemia, and ESRD on dialysis.  Patient presents to emergency department with abdominal pain persisting for 2 days and was admitted on 01/31/2021 with  c/f Intra-abdominal Infection .  Pharmacy has been consulted for Zosyn dosing.  Plan: Initiate Zosyn 2.25g q8h (HD-dosing)  Height: 5\' 11"  (180.3 cm) Weight: 99 kg (218 lb 4.1 oz) IBW/kg (Calculated) : 75.3  Temp (24hrs), Avg:98.1 F (36.7 C), Min:96.9 F (36.1 C), Max:99.7 F (37.6 C)  Recent Labs  Lab 01/31/21 0909 02/01/21 0618 02/02/21 0030 02/02/21 0647  WBC 7.4 5.1  --  4.0  CREATININE 9.75* 8.12* 4.52* 4.75*    Estimated Creatinine Clearance: 15.9 mL/min (A) (by C-G formula based on SCr of 4.75 mg/dL (H)).    Allergies  Allergen Reactions   Simvastatin Other (See Comments)    Antimicrobials this admission: Zosyn (11/29 >>   Dose adjustments this admission: Will CTM. Pt is ESRD-HD. Nephro consulted.  Microbiology results: 11/29 BCx: sent 11/27 Flu/Cov: negative  11/29 MRSA PCR: Negative  Thank you for allowing pharmacy to be a part of this patient's care.  Antonio Phillips 02/02/2021 3:41 PM

## 2021-02-02 NOTE — Plan of Care (Signed)
  Problem: Education: Goal: Knowledge of General Education information will improve Description: Including pain rating scale, medication(s)/side effects and non-pharmacologic comfort measures Outcome: Not Progressing   Problem: Health Behavior/Discharge Planning: Goal: Ability to manage health-related needs will improve Outcome: Not Progressing   Problem: Clinical Measurements: Goal: Ability to maintain clinical measurements within normal limits will improve Outcome: Progressing Goal: Will remain free from infection Outcome: Progressing Goal: Diagnostic test results will improve Outcome: Progressing Goal: Respiratory complications will improve Outcome: Progressing Goal: Cardiovascular complication will be avoided Outcome: Progressing   Problem: Activity: Goal: Risk for activity intolerance will decrease Outcome: Not Progressing   Problem: Nutrition: Goal: Adequate nutrition will be maintained Outcome: Progressing   Problem: Coping: Goal: Level of anxiety will decrease Outcome: Progressing   Problem: Elimination: Goal: Will not experience complications related to bowel motility Outcome: Not Met (add Reason) Note: Just arrived on unit has not had a BM Goal: Will not experience complications related to urinary retention Outcome: Not Met (add Reason) Note: Just arrived on unit has not voided as of yet    Problem: Pain Managment: Goal: General experience of comfort will improve Outcome: Not Met (add Reason)   Problem: Safety: Goal: Ability to remain free from injury will improve Outcome: Progressing   Problem: Skin Integrity: Goal: Risk for impaired skin integrity will decrease Outcome: Progressing

## 2021-02-02 NOTE — Consult Note (Addendum)
[place] Nurse wound follow up Consult requested for bilat hip wounds and left stump.  Performed yesterday to right hip and left stump, but I was not aware of left hip wound at that time.  Refer to previous progress notes for assessment and measurements.  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 Darker colored skin surrounding. Pressure injury present on admission: yes Topical treatment orders provided for bedside nurses to perform as follows: 1. Tuck a piece of gauze packing strip Kellie Simmering # [phone]) into right hip wound Q day, using a swab to fill and leaving a tail sticking out of the wound. Cover with foam dressing.  (Change foam dressing Q 3 days or PRN soiling.) 2. Foam dressing to left stump and left hip, change Q 3 days or PRN soiling Please re-consult if further assistance is needed.  Thank-you,  Julien Girt MSN, [place], [place], [place], [place]

## 2021-02-02 NOTE — Progress Notes (Signed)
[facility]  ROUNDING NOTE   Subjective:   Antonio Phillips is a 75 year old male with past medical history including hypotension, obesity, peripheral artery disease, diabetes, hyperlipidemia, and end-stage renal disease on dialysis.  Patient presents to emergency department with abdominal pain persisting for 2 days.  Patient admitted for further evaluation for Choledocholithiasis [K80.50] Pain [R52]  Patient is known to our practice for previous admissions and receives outpatient dialysis at [facility], supervised by Dr. Candiss Norse.  He receives dialysis on a Tuesday Thursday Saturday schedule.    Patient seen resting quietly, remains on room air Currently n.p.o. for procedure Received dialysis yesterday for hyperkalemia.  Tolerated well Potassium 3.9 this morning   Objective:  Vital signs in last 24 hours:  Temp:  [96.9 F (36.1 C)-98.5 F (36.9 C)] 98 F (36.7 C) (11/29 0847) Pulse Rate:  [49-133] 80 (11/29 0847) Resp:  [11-23] 16 (11/29 0847) BP: (80-121)/(55-82) 95/61 (11/29 0847) SpO2:  [92 %-100 %] 96 % (11/29 0847) Weight:  [99 kg] 99 kg (11/28 2052)  Weight change: 1.4 kg Filed Weights   01/31/21 0904 02/01/21 2052  Weight: 97.6 kg 99 kg    Intake/Output: I/O last 3 completed shifts: In: 0  Out: -93    Intake/Output this shift:  No intake/output data recorded.  Physical Exam: General: NAD, resting comfortably  Head: Normocephalic, atraumatic. Moist oral mucosal membranes  Eyes: Anicteric  Lungs:  Clear to auscultation, normal effort, room air  Heart: Regular rate and rhythm  Abdomen:  Soft, tender, mild distention  Extremities: No peripheral edema.  Right BKA left AKA  Neurologic: Nonfocal, moving all four extremities  Skin: No lesions  Access: Left aVF    Basic Metabolic Panel: Recent Labs  Lab 01/31/21 0909 01/31/21 2030 02/01/21 0618 02/02/21 0030 02/02/21 0647  NA 138  --  136 133* 135  K 5.7* 4.8 5.8* 3.9 4.3  CL 101  --   100 96* 98  CO2 20*  --  23 26 26   GLUCOSE 76  --  78 87 87  BUN 89*  --  77* 36* 42*  CREATININE 9.75*  --  8.12* 4.52* 4.75*  CALCIUM 8.2*  --  8.4* 8.7* 8.4*  MG  --   --   --  1.4*  --   PHOS  --   --  8.0*  --   --      Liver Function Tests: Recent Labs  Lab 01/31/21 0909 02/01/21 0618 02/02/21 0647  AST 67* 51* 40  ALT 44 42 32  ALKPHOS 387* 332* 423*  BILITOT 4.2* 3.3* 3.6*  PROT 5.7* 5.2* 5.3*  ALBUMIN 2.9* 2.8* 2.8*    Recent Labs  Lab 01/31/21 0909  LIPASE 1,021*    Recent Labs  Lab 02/02/21 0030  AMMONIA 29    CBC: Recent Labs  Lab 01/31/21 0909 02/01/21 0618 02/02/21 0647  WBC 7.4 5.1 4.0  NEUTROABS 6.0  --   --   HGB 8.5* 7.5* 6.9*  HCT 26.9* 23.7* 21.7*  MCV 95.4 96.0 95.6  PLT 62* 48* 45*     Cardiac Enzymes: No results for input(s): CKTOTAL, CKMB, CKMBINDEX, TROPONINI in the last 168 hours.  BNP: Invalid input(s): POCBNP  CBG: No results for input(s): GLUCAP in the last 168 hours.  Microbiology: Results for orders placed or performed during the hospital encounter of 01/31/21  Resp Panel by RT-PCR (Flu A&B, Covid) Nasopharyngeal Swab     Status: None   Collection Time: 01/31/21  9:11 AM   Specimen: Nasopharyngeal Swab; Nasopharyngeal(NP) swabs in vial transport medium  Result Value Ref Range Status   SARS Coronavirus 2 by RT PCR NEGATIVE NEGATIVE Final    Comment: (NOTE) SARS-CoV-2 target nucleic acids are NOT DETECTED.  The SARS-CoV-2 RNA is generally detectable in upper respiratory specimens during the acute phase of infection. The lowest concentration of SARS-CoV-2 viral copies this assay can detect is 138 copies/mL. A negative result does not preclude SARS-Cov-2 infection and should not be used as the sole basis for treatment or other patient management decisions. A negative result may occur with  improper specimen collection/handling, submission of specimen other than nasopharyngeal swab, presence of viral mutation(s)  within the areas targeted by this assay, and inadequate number of viral copies(<138 copies/mL). A negative result must be combined with clinical observations, patient history, and epidemiological information. The expected result is Negative.  Fact Sheet for Patients:  EntrepreneurPulse.com.au  Fact Sheet for Healthcare Providers:  IncredibleEmployment.be  This test is no t yet approved or cleared by the [place] FDA and  has been authorized for detection and/or diagnosis of SARS-CoV-2 by FDA under an Emergency Use Authorization (EUA). This EUA will remain  in effect (meaning this test can be used) for the duration of the COVID-19 declaration under Section 564(b)(1) of the Act, 21 U.S.C.section 360bbb-3(b)(1), unless the authorization is terminated  or revoked sooner.       Influenza A by PCR NEGATIVE NEGATIVE Final   Influenza B by PCR NEGATIVE NEGATIVE Final    Comment: (NOTE) The Xpert Xpress SARS-CoV-2/FLU/RSV plus assay is intended as an aid in the diagnosis of influenza from Nasopharyngeal swab specimens and should not be used as a sole basis for treatment. Nasal washings and aspirates are unacceptable for Xpert Xpress SARS-CoV-2/FLU/RSV testing.  Fact Sheet for Patients: EntrepreneurPulse.com.au  Fact Sheet for Healthcare Providers: IncredibleEmployment.be  This test is not yet approved or cleared by the [place] FDA and has been authorized for detection and/or diagnosis of SARS-CoV-2 by FDA under an Emergency Use Authorization (EUA). This EUA will remain in effect (meaning this test can be used) for the duration of the COVID-19 declaration under Section 564(b)(1) of the Act, 21 U.S.C. section 360bbb-3(b)(1), unless the authorization is terminated or revoked.  Performed at [facility], [address] [place], [place] [address]     Coagulation Studies: Recent Labs     02/02/21 0647  LABPROT 21.9*  INR 1.9*    Urinalysis: No results for input(s): COLORURINE, LABSPEC, PHURINE, GLUCOSEU, HGBUR, BILIRUBINUR, KETONESUR, PROTEINUR, UROBILINOGEN, NITRITE, LEUKOCYTESUR in the last 72 hours.  Invalid input(s): APPERANCEUR    Imaging: CT CHEST ABDOMEN PELVIS WO CONTRAST  Result Date: 01/31/2021 CLINICAL DATA:  Chest and abdominal pain EXAM: CT CHEST, ABDOMEN AND PELVIS WITHOUT CONTRAST TECHNIQUE: Multidetector CT imaging of the chest, abdomen and pelvis was performed following the standard protocol without IV contrast. COMPARISON:  CT chest 09/20/2020 FINDINGS: CT CHEST FINDINGS Cardiovascular: Heart is enlarged. Small pericardial effusion. Extensive coronary artery calcifications. Mildly dilated pulmonary artery measuring 3.1 cm diameter. Thoracic aorta is tortuous and normal caliber with moderate calcified plaques. Mediastinum/Nodes: Previous right thyroidectomy changes. No bulky lymphadenopathy identified. Mildly enlarged right pretracheal lymph node measuring 11 mm. Lungs/Pleura: Trace bilateral pleural effusions right greater than left. No focal consolidation identified in the lungs. No pneumothorax. Musculoskeletal: No chest wall mass or suspicious bone lesions identified. CT ABDOMEN PELVIS FINDINGS Hepatobiliary: Liver is upper normal size with no suspicious mass identified. Gallbladder is  mildly distended and contains dependent calculi. 1.4 cm round density in the distal common bile duct consistent with choledocholithiasis. The common bile duct is dilated measuring 1.8 cm in diameter. Mild intrahepatic ductal dilatation. Fat stranding in the right upper quadrant. Pancreas: Mild pancreatic and peripancreatic inflammatory fat stranding changes centered at the pancreatic head. No mass or ductal dilatation appreciated. Spleen: Spleen is enlarged measuring 16 cm in length. Adrenals/Urinary Tract: Adrenal glands appear normal. Kidneys are atrophic with multiple cysts  bilaterally measuring up to 4 cm on the left. No hydronephrosis visualized. Urinary bladder incompletely visualized with appearance of mild wall thickening. Stomach/Bowel: No bowel obstruction, free air or pneumatosis. Mild wall thickening of the duodenum, likely reactive from adjacent inflammatory changes. Colonic diverticulosis. Appendix is normal. Vascular/Lymphatic: Severe calcified atherosclerotic plaques. No bulky lymphadenopathy identified. Multiple small retroperitoneal and upper abdominal lymph nodes are noted which are nonspecific. Reproductive: Prostate gland not well visualized. Other: Trace ascites. Musculoskeletal: Degenerative changes of the spine. Bilateral hip prosthesis. No suspicious bony lesions identified. IMPRESSION: 1. Evidence of acute pancreatitis. 2. Choledocholithiasis including a 1.4 cm calculus in the distal common bile duct. Biliary ductal dilatation suggesting obstruction. Consider ERCP. 3. Cholelithiasis with pericholecystic edema which may represent acute cholecystitis or reactive changes. 4. Colonic diverticulosis. 5. Splenomegaly. 6. Cardiomegaly with small pericardial effusion. 7. Small pleural effusions. 8. Coronary artery disease and atherosclerotic disease. Evidence of pulmonary artery hypertension. 9. Additional chronic findings as described. Electronically Signed   By: Ofilia Neas M.D.   On: 01/31/2021 13:58     Medications:    sodium chloride 50 mL/hr at 02/01/21 2353   sodium chloride     sodium chloride      sodium chloride   Intravenous Once   aspirin EC  81 mg Oral Daily   atorvastatin  40 mg Oral QHS   Chlorhexidine Gluconate Cloth  6 each Topical Q0600   clotrimazole  1 application Topical BID   diclofenac Sodium  2 g Topical q AM   finasteride  5 mg Oral Daily   loratadine  10 mg Oral Once per day on Sun Mon Wed Fri   midodrine  10 mg Oral Q T,Th,Sa-HD   midodrine  5 mg Oral Q0600   patiromer  16.8 g Oral Daily   pregabalin  75 mg Oral BID    rOPINIRole  1 mg Oral QHS   sevelamer carbonate  1,600 mg Oral 2 times per day on Tue Thu Sat   [START ON 02/03/2021] sevelamer carbonate  1,600 mg Oral 3 times per day on Sun Mon Wed Fri   vitamin B-12  1,000 mcg Oral Daily     Assessment/ Plan:  [name] is a 75 y.o.  male with past medical history including hypotension, obesity, peripheral artery disease, diabetes, hyperlipidemia, and end-stage renal disease on dialysis.  Patient presents to emergency department with abdominal pain persisting for 2 days.  Patient admitted for further evaluation for Choledocholithiasis [K80.50] Pain [R52]  [facility]/TTS/Lt AVF   End-stage renal disease with hyperkalemia on dialysis.  Will maintain outpatient schedule, if possible.  Decision was made to perform dialysis yesterday to improve hyperkalemia for procedure scheduled today.  Patient tolerated treatment well, potassium reduced to 3.9 this morning.  Plan to dialyze patient tomorrow along with Thursday to maintain outpatient schedule.  2. Anemia of chronic kidney disease Lab Results  Component Value Date   HGB 6.9 (L) 02/02/2021    Will prescribe EPO with dialysis treatments.  Will defer need for blood transfusion to primary team.  3. Secondary Hyperparathyroidism: Lab Results  Component Value Date   CALCIUM 8.4 (L) 02/02/2021   PHOS 8.0 (H) 02/01/2021  Continuing to monitor bone minerals during this admission. Sevelamer with meals    LOS: 2 Mikaiah Stoffer 11/29/20229:39 AM

## 2021-02-02 NOTE — Progress Notes (Addendum)
Brief Narrative/Hospital Course:   Antonio Phillips, 75 y.o. 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 fib on Eliquis, HLD, chronic hypotension on midodrine, morbid obesity who presented from nursing home with complaints of abdominal pain and emesis of 2-3 days duration.    Seen in the ED,cxr pulmonary edema,at least moderate pleural effusion with compressive atelectasis, CT chest abdomen pelvis showed acute pancreatitis, choledocholithiasis 1.4 cm calculus in the distal CBD with biliary duct dilatation suggesting obstruction .Clinically no concerns for acute cholangitis however.  ERCP was advised by Gastroenterology.  Procedure scheduled for this afternoon.  Patient however was noted to have acute anemia on 02/02/2021.  PRBC transfusion was advised at that time.  Subjective: Patient was noted to be lethargic this morning.  Hemoglobin was noted to have trended down to 6.9.  He is scheduled for PRBC transfusion x1 unit.  Patient was later reported to be hypotensive. He has chronic hypotension at baseline being treated with midodrine. Blood transfusion was initiated with improvement.  Midodrine was given.  He was also reported to have had a large bowel movement.  Maroon like and brown.  GI was made aware.  Patient is on schedule for ERCP  No reported shortness of breath.  No fever at this time.  Multiple decubitus ulcers noted.Patient also complains of wrist joint pain  Objective: Vital signs in last 24 hours: Temp:  [96.9 F (36.1 C)-99.7 F (37.6 C)] 97.8 F (36.6 C) (11/29 1359) Pulse Rate:  [49-133] 80 (11/29 1359) Resp:  [11-20] 16 (11/29 1203) BP: (77-121)/(48-76) 82/48 (11/29 1359) SpO2:  [92 %-100 %] 97 % (11/29 1203) Weight:  [99 kg] 99 kg (11/28 2052) Weight change: 1.4 kg Last BM Date: 01/31/21  Intake/Output from previous day: No intake/output data recorded. Intake/Output this shift: No intake/output data  recorded.  General appearance: uncooperative and patient looks lethargic.  Chronically ill looking. Head: Normocephalic, without obvious abnormality, atraumatic Resp: diminished breath sounds base - bilateral and poor respiratory effort Chest wall: no tenderness Cardio: normal apical impulse and regularly irregular rhythm Extremities: Bilateral lower extremity amputation. Skin: Sacral decubitus ulcers.  Bilateral stump ulcers.  Lab Results: Recent Labs    02/01/21 0618 02/02/21 0647  WBC 5.1 4.0  HGB 7.5* 6.9*  HCT 23.7* 21.7*  PLT 48* 45*   BMET Recent Labs    02/02/21 0030 02/02/21 0647  NA 133* 135  K 3.9 4.3  CL 96* 98  CO2 26 26  GLUCOSE 87 87  BUN 36* 42*  CREATININE 4.52* 4.75*  CALCIUM 8.7* 8.4*    Studies/Results: No results found.  Medications: Scheduled:  sodium chloride   Intravenous Once   aspirin EC  81 mg Oral Daily   atorvastatin  40 mg Oral QHS   clotrimazole  1 application Topical BID   diclofenac Sodium  2 g Topical q AM   [START ON 02/04/2021] epoetin (EPOGEN/PROCRIT) injection  10,000 Units Intravenous Q T,Th,Sa-HD   finasteride  5 mg Oral Daily   loratadine  10 mg Oral Once per day on Sun Mon Wed Fri   midodrine  10 mg Oral Q T,Th,Sa-HD   midodrine  5 mg Oral Q0600   pregabalin  75 mg Oral BID   rOPINIRole  1 mg Oral QHS   sevelamer carbonate  1,600 mg Oral 2 times per day on Tue Thu Sat   [START ON 02/03/2021] sevelamer carbonate  1,600 mg Oral 3  times per day on Sun Mon Wed Fri   vitamin B-12  1,000 mcg Oral Daily    Assessment/Plan: Acute on chronic anemia: New.  Hemoglobin this morning was 6.9.  Down from 7.5.  Baseline anemia of chronic kidney disease.  Reported maroon-like stool.  Patient has a scheduled ERCP for his abdomen.  Patient has required PRBC transfusion x1 unit.  PPI was initiated.  We will repeat hemoglobin status posttransfusion.  Anticoagulation was held  Acute on chronic hypotension: At baseline, patient is on  midodrine.  Unclear if GI blood loss may be contributory.  Midodrine was restarted.  PRBC transfusion was also initiated.  Acute biliary pancreatitis secondary to choledocholithiasis.  Lipase greater than 1000 on admission.  This was confirmed on CT scan.  GI consult appreciated with plans for ERCP hopefully for today.  GI had recommended no need for antibiotics at the time due to no obvious evidence of cholangitis.  We will continue to cautiously monitor.  Permanent atrial fibrillation: Rate is currently controlled.  Anticoagulation was held due to acute GI blood loss.  End-stage renal disease on hemodialysis: Patient had hemodialysis done yesterday.  Nephrology continues to follow-up will advise further management.  No evidence of any fluid overload at this time.  Coronary disease with severe single-vessel disease: Cardiology input appreciated.  Aggressive medical management at this time.  Aspirin, Plavix and Eliquis were all held due to GI blood loss.  Multiple decubitus ulcers: Wound care evaluation advised.  WOCN notes appreciated.  Foam dressing advised.  History of peripheral arterial disease status post bilateral lower extremity amputations.  Class I obesity: Lifestyle modification advised.  Due to patient's multiple medical comorbidities, patient will need to be considered for palliative care consults.  Patient has a listed friend as POA,I have been unable to reach friend.  DVT prophylaxis with SCDs only.  CODE STATUS full code.   LOS: 2 days   [place] 02/02/2021, 2:06 PM

## 2021-02-02 NOTE — Progress Notes (Signed)
Rapid Response Event Note   Reason for Call : Hypotensive, afib   Initial Focused Assessment: On arrival pt is alert and able  to follow commands. Pt is in afib on the monitor with a rate of 143. BP 79/53. Pt is afebrile and O2 sat is 94% on room air. Reports of maroon colored stool earlier in the day. Hgb 6.1. 2nd unit of PRBCs infusing. Pt is bilateral BKA, fistula in left arm, only able to take BP in right arm at this time. Pt c/o being dizzy and weak.  Interventions: 2L [place] placed on pt, he says it hurts when he takes a deep breath. Hospitalist at bedside assessing patient.   Plan of Care: Pt will be transferred to stepdown at this time per Dr. Phineas Inches. Intensivist also consulted.   Event Summary:   MD Notified: Dr. Phineas Inches Call Time: [place] Time: 1508 End Time: 8099 (pt transferred to ICU 4)  Trellis Paganini, RN

## 2021-02-02 NOTE — Progress Notes (Signed)
Cephas Darby, MD [address]  [place]  [place], [place] [address]  Main: [phone]  Fax: [phone] Pager: [phone]   Subjective: ERCP was canceled for today because of worsening anemia and A. fib with RVR.  Patient is transferred to ICU.  Patient had an episode of large sized maroon-colored bowel movement this afternoon when he was receiving blood transfusion per the floor nurse.  Patient underwent hemodialysis yesterday.  Patient is sleeping and I was unable to wake him up.   Objective: Vital signs in last 24 hours: Vitals:   02/02/21 1632 02/02/21 1700 02/02/21 1730 02/02/21 1800  BP: (!) 74/48 (!) 82/62 (!) 85/64 (!) 97/58  Pulse: 79 (!) 120 (!) 125 (!) 117  Resp: 11 12 12 13   Temp:   (!) 97.5 F (36.4 C)   TempSrc:   Oral   SpO2: 91% 96% 100% 98%  Weight:      Height:       Weight change: 1.4 kg  Intake/Output Summary (Last 24 hours) at 02/02/2021 1813 Last data filed at 02/02/2021 1809 Gross per 24 hour  Intake 1036.13 ml  Output 0 ml  Net 1036.13 ml     Exam: Heart:: Tachycardia, irregular rhythm Lungs: clear to auscultation Abdomen: soft, nontender, normal bowel sounds   Lab Results: @LABTEST2 @ Micro Results: Recent Results (from the past 240 hour(s))  Resp Panel by RT-PCR (Flu A&B, Covid) Nasopharyngeal Swab     Status: None   Collection Time: 01/31/21  9:11 AM   Specimen: Nasopharyngeal Swab; Nasopharyngeal(NP) swabs in vial transport medium  Result Value Ref Range Status   SARS Coronavirus 2 by RT PCR NEGATIVE NEGATIVE Final    Comment: (NOTE) SARS-CoV-2 target nucleic acids are NOT DETECTED.  The SARS-CoV-2 RNA is generally detectable in upper respiratory specimens during the acute phase of infection. The lowest concentration of SARS-CoV-2 viral copies this assay can detect is 138 copies/mL. A negative result does not preclude SARS-Cov-2 infection and should not be used as the sole basis for treatment or other patient  management decisions. A negative result may occur with  improper specimen collection/handling, submission of specimen other than nasopharyngeal swab, presence of viral mutation(s) within the areas targeted by this assay, and inadequate number of viral copies(<138 copies/mL). A negative result must be combined with clinical observations, patient history, and epidemiological information. The expected result is Negative.  Fact Sheet for Patients:  EntrepreneurPulse.com.au  Fact Sheet for Healthcare Providers:  IncredibleEmployment.be  This test is no t yet approved or cleared by the [place] FDA and  has been authorized for detection and/or diagnosis of SARS-CoV-2 by FDA under an Emergency Use Authorization (EUA). This EUA will remain  in effect (meaning this test can be used) for the duration of the COVID-19 declaration under Section 564(b)(1) of the Act, 21 U.S.C.section 360bbb-3(b)(1), unless the authorization is terminated  or revoked sooner.       Influenza A by PCR NEGATIVE NEGATIVE Final   Influenza B by PCR NEGATIVE NEGATIVE Final    Comment: (NOTE) The Xpert Xpress SARS-CoV-2/FLU/RSV plus assay is intended as an aid in the diagnosis of influenza from Nasopharyngeal swab specimens and should not be used as a sole basis for treatment. Nasal washings and aspirates are unacceptable for Xpert Xpress SARS-CoV-2/FLU/RSV testing.  Fact Sheet for Patients: EntrepreneurPulse.com.au  Fact Sheet for Healthcare Providers: IncredibleEmployment.be  This test is not yet approved or cleared by the [place] FDA and has been authorized for detection  and/or diagnosis of SARS-CoV-2 by FDA under an Emergency Use Authorization (EUA). This EUA will remain in effect (meaning this test can be used) for the duration of the COVID-19 declaration under Section 564(b)(1) of the Act, 21 U.S.C. section 360bbb-3(b)(1),  unless the authorization is terminated or revoked.  Performed at [facility], [address] [place], [place] [address]   MRSA Next Gen by PCR, Nasal     Status: None   Collection Time: 02/02/21  9:21 AM   Specimen: Nasal Mucosa; Nasal Swab  Result Value Ref Range Status   MRSA by PCR Next Gen NOT DETECTED NOT DETECTED Final    Comment: (NOTE) The GeneXpert MRSA Assay (FDA approved for NASAL specimens only), is one component of a comprehensive MRSA colonization surveillance program. It is not intended to diagnose MRSA infection nor to guide or monitor treatment for MRSA infections. Test performance is not FDA approved in patients less than 68 years old. Performed at [facility], [address] [place], [place] [address]    Studies/Results: No results found. Medications: I have reviewed the patient's current medications. Prior to Admission:  Medications Prior to Admission  Medication Sig Dispense Refill Last Dose   acetaminophen (TYLENOL) 325 MG tablet Take 2 tablets (650 mg total) by mouth every 6 (six) hours as needed for mild pain or moderate pain.   Unknown at PRN   apixaban (ELIQUIS) 5 MG TABS tablet Take 1 tablet (5 mg total) by mouth 2 (two) times daily. 60 tablet 0 01/30/2021 at 2015   atorvastatin (LIPITOR) 40 MG tablet Take 40 mg by mouth at bedtime.   01/30/2021 at 2015   Carboxymethylcellulose Sod PF 0.5 % SOLN Place 1 drop into both eyes 3 (three) times daily as needed (dry eyes).   Unknown at PRN   carvedilol (COREG) 3.125 MG tablet Take 1 tablet (3.125 mg total) by mouth 2 (two) times daily with a meal. 60 tablet 0 01/30/2021 at 1730   ciprofloxacin (CIPRO) 500 MG tablet Take 500 mg by mouth 2 (two) times daily.   01/30/2021 at 1730   clopidogrel (PLAVIX) 75 MG tablet Take 1 tablet (75 mg total) by mouth daily with breakfast. 30 tablet 0 01/30/2021 at 0800   clotrimazole (LOTRIMIN) 1 % cream Apply 1 application topically 2 (two) times daily.  (Apply under foreskin of penis)   01/30/2021 at 2245   diclofenac Sodium (VOLTAREN) 1 % GEL Apply 2 g topically in the morning. (Apply to right stump)   01/30/2021 at 0845   doxycycline (VIBRAMYCIN) 100 MG capsule Take 100 mg by mouth 2 (two) times daily.   01/30/2021 at 1730   finasteride (PROSCAR) 5 MG tablet Take 5 mg by mouth daily.   01/30/2021 at 0800   HYDROcodone-acetaminophen (NORCO/VICODIN) 5-325 MG tablet Take 1 tablet by mouth every 6 (six) hours as needed for severe pain. 10 tablet 0 Unknown at PRN   lactulose, encephalopathy, (CHRONULAC) 10 GM/15ML SOLN Take 10 g by mouth 2 (two) times daily as needed (mild constipation).   Unknown` at PRN   loperamide (IMODIUM A-D) 2 MG tablet Take 2 tablets (4 mg total) by mouth 4 (four) times daily as needed for diarrhea or loose stools. 30 tablet 0 Unknown at PRN   loratadine (CLARITIN) 10 MG tablet Take 10 mg by mouth See admin instructions. Take 10 mg by mouth daily on Monday, Wednesday, Friday and Sunday   01/29/2021 at 1045   midodrine (PROAMATINE) 10 MG tablet Take 10 mg  by mouth Every Tuesday,Thursday,and Saturday with dialysis.   01/28/2021 at 1030   midodrine (PROAMATINE) 5 MG tablet Take 5 mg by mouth daily at 6 (six) AM.   01/30/2021 at 0615   pregabalin (LYRICA) 75 MG capsule Take 75 mg by mouth 2 (two) times daily.   01/30/2021 at 2015   rOPINIRole (REQUIP) 1 MG tablet Take 1 mg by mouth at bedtime.   01/30/2021 at 2015   sevelamer carbonate (RENVELA) 800 MG tablet Take 1,600 mg by mouth See admin instructions. Take 2 tablets (1600mg ) by mouth three times daily with meals on Monday, Wednesday, Friday and "Sunday   01/29/2021 at 1045   sevelamer carbonate (RENVELA) 800 MG tablet Take 800 mg by mouth See admin instructions. Take 2 tablets (1600mg) by mouth twice daily with meals (breakfast and dinner) on Tuesday, Thursday and Saturday   01/30/2021 at 1730   sodium hypochlorite (DAKIN'S 1/4 STRENGTH) 0.125 % SOLN Apply 1 application topically  See admin instructions. Use as directed after cleansing left stump wound - use twice daily and as needed   01/30/2021 at 2245   vitamin B-12 (CYANOCOBALAMIN) 1000 MCG tablet Take 1,000 mcg by mouth daily.      mupirocin ointment (BACTROBAN) 2 % Place 1 application into the nose 2 (two) times daily. (Patient not taking: Reported on 01/31/2021) 22 g 0 Not Taking   Scheduled:  sodium chloride   Intravenous Once   aspirin EC  81 mg Oral Daily   atorvastatin  40 mg Oral QHS   [START ON 02/03/2021] Chlorhexidine Gluconate Cloth  6 each Topical Q0600   clotrimazole  1 application Topical BID   diclofenac Sodium  2 g Topical q AM   [START ON 02/04/2021] epoetin (EPOGEN/PROCRIT) injection  10,000 Units Intravenous Q T,Th,Sa-HD   finasteride  5 mg Oral Daily   loratadine  10 mg Oral Once per day on Sun Mon Wed Fri   midodrine  10 mg Oral Q T,Th,Sa-HD   midodrine  5 mg Oral Q0600   pantoprazole (PROTONIX) IV  40 mg Intravenous Q12H   pregabalin  75 mg Oral BID   rOPINIRole  1 mg Oral QHS   sevelamer carbonate  1,600 mg Oral 2 times per day on Tue Thu Sat   [START ON 02/03/2021] sevelamer carbonate  1,600 mg Oral 3 times per day on Sun Mon Wed Fri   vitamin B-12  1,000 mcg Oral Daily   Continuous:  sodium chloride 50 mL/hr at 02/02/21 1730   sodium chloride     sodium chloride     magnesium sulfate bolus IVPB 100 mL/hr at 02/02/21 1809   piperacillin-tazobactam (ZOSYN)  IV 2.25 g (02/02/21 1809)   PRN:sodium chloride, sodium chloride, acetaminophen, alteplase, heparin, HYDROcodone-acetaminophen, lactulose, lidocaine (PF), lidocaine-prilocaine, loperamide, morphine injection, pentafluoroprop-tetrafluoroeth, polyvinyl alcohol, sodium hypochlorite Anti-infectives (From admission, onward)    Start     Dose/Rate Route Frequency Ordered Stop   02/02/21 2200  ceFEPIme (MAXIPIME) 1 g in sodium chloride 0.9 % 100 mL IVPB  Status:  Discontinued        1 g 200 mL/hr over 30 Minutes Intravenous Every 12  hours 02/02/21 1541 02/02/21 1643   02/02/21 1645  piperacillin-tazobactam (ZOSYN) IVPB 2.25 g        2.25 g 100 mL/hr over 30 Minutes Intravenous Every 8 hours 02/02/21 1549     11" /28/22 0900  piperacillin-tazobactam (ZOSYN) IVPB 2.25 g  Status:  Discontinued        2.25 g 100  mL/hr over 30 Minutes Intravenous Every 8 hours 02/01/21 0854 02/01/21 1013      Scheduled Meds:  sodium chloride   Intravenous Once   aspirin EC  81 mg Oral Daily   atorvastatin  40 mg Oral QHS   [START ON 02/03/2021] Chlorhexidine Gluconate Cloth  6 each Topical Q0600   clotrimazole  1 application Topical BID   diclofenac Sodium  2 g Topical q AM   [START ON 02/04/2021] epoetin (EPOGEN/PROCRIT) injection  10,000 Units Intravenous Q T,Th,Sa-HD   finasteride  5 mg Oral Daily   loratadine  10 mg Oral Once per day on Sun Mon Wed Fri   midodrine  10 mg Oral Q T,Th,Sa-HD   midodrine  5 mg Oral Q0600   pantoprazole (PROTONIX) IV  40 mg Intravenous Q12H   pregabalin  75 mg Oral BID   rOPINIRole  1 mg Oral QHS   sevelamer carbonate  1,600 mg Oral 2 times per day on Tue Thu Sat   [START ON 02/03/2021] sevelamer carbonate  1,600 mg Oral 3 times per day on Sun Mon Wed Fri   vitamin B-12  1,000 mcg Oral Daily   Continuous Infusions:  sodium chloride 50 mL/hr at 02/02/21 1730   sodium chloride     sodium chloride     magnesium sulfate bolus IVPB 100 mL/hr at 02/02/21 1809   piperacillin-tazobactam (ZOSYN)  IV 2.25 g (02/02/21 1809)   PRN Meds:.sodium chloride, sodium chloride, acetaminophen, alteplase, heparin, HYDROcodone-acetaminophen, lactulose, lidocaine (PF), lidocaine-prilocaine, loperamide, morphine injection, pentafluoroprop-tetrafluoroeth, polyvinyl alcohol, sodium hypochlorite   Assessment: Principal Problem:   Abdominal pain Active Problems:   ESRD (end stage renal disease) (HCC)   Atrial fibrillation, chronic (HCC)   Wound of skin   Choledocholithiasis   Morbid obesity (HCC)   Normocytic anemia    Atrial fibrillation with rapid ventricular response ([place])  Antonio Phillips is a 75 y.o. male with history of peripheral artery disease s/p bilateral BKA, A. fib on Eliquis, CAD s/p PCI with DES in 09/2020 on DAPT, ESRD on hemodialysis is admitted with acute gallstone pancreatitis and choledocholithiasis, hospital course complicated by worsening anemia, hematochezia, new onset of A. fib with RVR, transferred to stepdown  Plan: Acute gallstone pancreatitis with choledocholithiasis Patient denies any abdominal pain, nausea or vomiting, LFTs have slightly worsened since yesterday ?  Ascending cholangitis Agree with broad-spectrum antibiotics Monitor LFTs daily Recommend blood cultures Unable to do ERCP today due to worsening anemia as well as new onset of A. fib with RVR.  We will plan to do ERCP on Thursday or sooner if patient's clinical condition deteriorates Okay with clear liquid diet  Hematochezia x 1 episode Likely ischemic colitis or hemorrhoidal bleed or diverticular bleed Less likely an upper GI bleed.  No evidence of cirrhosis Monitor closely for signs of recurrent bleeding Monitor CBC closely and transfuse to maintain hemoglobin above 8 and platelets above 50 in setting of significant coronary artery disease If patient has episodes of recurrent bleeding, he will need endoscopic evaluation Recommend Protonix 40 mg IV twice daily Eliquis and Plavix have been held Okay to continue aspirin 81 mg daily  A. fib with RVR Appreciate cardiology input  With multiple comorbidities, patient is critically ill GI will follow along with you   LOS: 2 days   Cullen Lahaie 02/02/2021, 6:13 PM

## 2021-02-02 NOTE — Anesthesia Preprocedure Evaluation (Addendum)
Anesthesia Evaluation  Patient identified by MRN, date of birth, ID band Patient awake    Reviewed: Allergy & Precautions, H&P , NPO status , Patient's Chart, lab work & pertinent test results  History of Anesthesia Complications Negative for: history of anesthetic complications  Airway Mallampati: IV   Neck ROM: full    Dental  (+) Poor Dentition Missing many teeth:   Pulmonary neg pulmonary ROS,    Pulmonary exam normal        Cardiovascular Exercise Tolerance: Poor + CAD, + Past MI and + Peripheral Vascular Disease (s/p right BKA and left AKA)  Normal cardiovascular exam+ dysrhythmias (a fib on Eliquis, held this admission)  Rhythm:regular Rate:Normal  Chronic hypotension on midodrine  ECG 02/02/21:  Atrial fibrillation with rapid ventricular response Low voltage QRS Nonspecific T wave abnormality  Cardiac cath 09/22/20:  1. Severe single-vessel coronary artery disease with 99% subtotal stenosis of the second obtuse marginal branch, treated with a 2.5 x 15 mm resolute Onyx DES. 30% residual stenosis at the case completion due to resistant/calcified lesion type even with high-pressure noncompliant balloon angioplasty. 2. Mild to moderate nonobstructive proximal LAD stenosis 3. Mild nonobstructive RCA stenosis  ECHO 09/28/20 1. Left ventricular ejection fraction, by estimation, is 45 to 50%. The left ventricle has mildly decreased function. Left ventricular endocardial border not optimally defined to evaluate regional wall motion. There is mild left ventricular hypertrophy.  2. Right ventricular systolic function is moderately reduced. The right ventricular size is normal. Mildly increased right ventricular wall thickness. There is moderately elevated pulmonary artery systolic pressure.  3. The mitral valve is abnormal. Trivial mitral valve regurgitation.  4. The aortic valve is tricuspid. There is mild thickening of the  aortic valve.  5. The inferior vena cava is dilated in size with <50% respiratory variability, suggesting right atrial pressure of 15 mmHg.    Neuro/Psych negative neurological ROS     GI/Hepatic Neg liver ROS, Acute on chronic anemia due to GI bleed requiring pRBC transfusions this admission; choledocholithiasis   Endo/Other  diabetes, Type 2Obesity   Renal/GU ESRF and DialysisRenal disease  negative genitourinary   Musculoskeletal Multiple decubitus ulcers   Abdominal   Peds  Hematology  (+) Blood dyscrasia (thrombocytopenia s/p 2 units platelets 02/03/21), anemia ,   Anesthesia Other Findings Cardiology note 02/03/21:  A/P: 1.  Permanent A. Fib -Heart rate is reasonable -Continue to hold Eliquis in light of upcoming procedure and anemia.  2.  CAD/PCI to OM2 09/2020 -Denies chest pain -Continue aspirin, Crestor.  3.  End-stage renal disease -Dialysis as per renal. -Continue midodrine for BP support  4.  Choledocholithiasis, gallstone pancreatitis -ERCP, management as per GI.  Reproductive/Obstetrics negative OB ROS                         Anesthesia Physical Anesthesia Plan  ASA: 4  Anesthesia Plan: General   Post-op Pain Management:    Induction: Intravenous  PONV Risk Score and Plan: 3 and Propofol infusion, TIVA and Treatment may vary due to age or medical condition  Airway Management Planned: Oral ETT  Additional Equipment:   Intra-op Plan:   Post-operative Plan: Extubation in OR  Informed Consent: I have reviewed the patients History and Physical, chart, labs and discussed the procedure including the risks, benefits and alternatives for the proposed anesthesia with the patient or authorized representative who has indicated his/her understanding and acceptance.     Dental Advisory Given  Plan  Discussed with: CRNA  Anesthesia Plan Comments: (Discussed thrombocytopenia with Dr. Janese Banks -- she recommends transfusing one  unit intraoperatively.  Discussed pt's tenuous medical condition and increased risk of anesthetic complications including death; pt understands the risks and wishes to proceed.  LMA/GETA backup discussed.  Patient consented for risks of anesthesia including but not limited to:  - adverse reactions to medications - damage to eyes, teeth, lips or other oral mucosa - nerve damage due to positioning  - sore throat or hoarseness - damage to heart, brain, nerves, lungs, other parts of body or loss of life  Informed patient about role of CRNA in peri- and intra-operative care.  Patient voiced understanding.)     Anesthesia Quick Evaluation

## 2021-02-03 DIAGNOSIS — I4891 Unspecified atrial fibrillation: Secondary | ICD-10-CM | POA: Diagnosis not present

## 2021-02-03 DIAGNOSIS — K8050 Calculus of bile duct without cholangitis or cholecystitis without obstruction: Secondary | ICD-10-CM | POA: Diagnosis not present

## 2021-02-03 DIAGNOSIS — R1011 Right upper quadrant pain: Secondary | ICD-10-CM | POA: Diagnosis not present

## 2021-02-03 DIAGNOSIS — D696 Thrombocytopenia, unspecified: Secondary | ICD-10-CM | POA: Diagnosis not present

## 2021-02-03 DIAGNOSIS — Z515 Encounter for palliative care: Secondary | ICD-10-CM

## 2021-02-03 DIAGNOSIS — N186 End stage renal disease: Secondary | ICD-10-CM | POA: Diagnosis not present

## 2021-02-03 LAB — CBC WITH DIFFERENTIAL/PLATELET
Abs Immature Granulocytes: 0.03 10*3/uL (ref 0.00–0.07)
Basophils Absolute: 0 10*3/uL (ref 0.0–0.1)
Basophils Relative: 0 %
Eosinophils Absolute: 0.1 10*3/uL (ref 0.0–0.5)
Eosinophils Relative: 2 %
HCT: 30.2 % — ABNORMAL LOW (ref 39.0–52.0)
Hemoglobin: 9.6 g/dL — ABNORMAL LOW (ref 13.0–17.0)
Immature Granulocytes: 1 %
Lymphocytes Relative: 16 %
Lymphs Abs: 0.9 10*3/uL (ref 0.7–4.0)
MCH: 30.1 pg (ref 26.0–34.0)
MCHC: 31.8 g/dL (ref 30.0–36.0)
MCV: 94.7 fL (ref 80.0–100.0)
Monocytes Absolute: 0.5 10*3/uL (ref 0.1–1.0)
Monocytes Relative: 8 %
Neutro Abs: 4.4 10*3/uL (ref 1.7–7.7)
Neutrophils Relative %: 73 %
Platelets: 57 10*3/uL — ABNORMAL LOW (ref 150–400)
RBC: 3.19 MIL/uL — ABNORMAL LOW (ref 4.22–5.81)
RDW: 17.2 % — ABNORMAL HIGH (ref 11.5–15.5)
WBC: 6 10*3/uL (ref 4.0–10.5)
nRBC: 0 % (ref 0.0–0.2)

## 2021-02-03 LAB — BPAM RBC
Blood Product Expiration Date: 202301022359
Blood Product Expiration Date: 202301022359
ISSUE DATE / TIME: 202211291144
ISSUE DATE / TIME: 202211291446
Unit Type and Rh: 5100
Unit Type and Rh: 5100

## 2021-02-03 LAB — BASIC METABOLIC PANEL
Anion gap: 10 (ref 5–15)
BUN: 54 mg/dL — ABNORMAL HIGH (ref 8–23)
CO2: 25 mmol/L (ref 22–32)
Calcium: 8.1 mg/dL — ABNORMAL LOW (ref 8.9–10.3)
Chloride: 98 mmol/L (ref 98–111)
Creatinine, Ser: 5.56 mg/dL — ABNORMAL HIGH (ref 0.61–1.24)
GFR, Estimated: 10 mL/min — ABNORMAL LOW (ref >60)
Glucose, Bld: 89 mg/dL (ref 70–99)
Potassium: 4.5 mmol/L (ref 3.5–5.1)
Sodium: 133 mmol/L — ABNORMAL LOW (ref 135–145)

## 2021-02-03 LAB — GLUCOSE, CAPILLARY
Glucose-Capillary: 110 mg/dL — ABNORMAL HIGH (ref 70–99)
Glucose-Capillary: 110 mg/dL — ABNORMAL HIGH (ref 70–99)
Glucose-Capillary: 111 mg/dL — ABNORMAL HIGH (ref 70–99)
Glucose-Capillary: 133 mg/dL — ABNORMAL HIGH (ref 70–99)
Glucose-Capillary: 138 mg/dL — ABNORMAL HIGH (ref 70–99)
Glucose-Capillary: 81 mg/dL (ref 70–99)
Glucose-Capillary: 88 mg/dL (ref 70–99)

## 2021-02-03 LAB — HEPATIC FUNCTION PANEL
ALT: 30 U/L (ref 0–44)
AST: 34 U/L (ref 15–41)
Albumin: 2.7 g/dL — ABNORMAL LOW (ref 3.5–5.0)
Alkaline Phosphatase: 364 U/L — ABNORMAL HIGH (ref 38–126)
Bilirubin, Direct: 1.4 mg/dL — ABNORMAL HIGH (ref 0.0–0.2)
Indirect Bilirubin: 1.2 mg/dL — ABNORMAL HIGH (ref 0.3–0.9)
Total Bilirubin: 2.6 mg/dL — ABNORMAL HIGH (ref 0.3–1.2)
Total Protein: 5.5 g/dL — ABNORMAL LOW (ref 6.5–8.1)

## 2021-02-03 LAB — CBC
HCT: 28 % — ABNORMAL LOW (ref 39.0–52.0)
Hemoglobin: 9.1 g/dL — ABNORMAL LOW (ref 13.0–17.0)
MCH: 30.3 pg (ref 26.0–34.0)
MCHC: 32.5 g/dL (ref 30.0–36.0)
MCV: 93.3 fL (ref 80.0–100.0)
Platelets: 34 10*3/uL — ABNORMAL LOW (ref 150–400)
RBC: 3 MIL/uL — ABNORMAL LOW (ref 4.22–5.81)
RDW: 17.2 % — ABNORMAL HIGH (ref 11.5–15.5)
WBC: 4.2 10*3/uL (ref 4.0–10.5)
nRBC: 0 % (ref 0.0–0.2)

## 2021-02-03 LAB — TYPE AND SCREEN
ABO/RH(D): O POS
Antibody Screen: NEGATIVE
Unit division: 0
Unit division: 0

## 2021-02-03 LAB — HEPATITIS B SURFACE ANTIBODY, QUANTITATIVE: Hep B S AB Quant (Post): <3.1 m[IU]/mL — ABNORMAL LOW (ref >9.9)

## 2021-02-03 LAB — MAGNESIUM: Magnesium: 1.8 mg/dL (ref 1.7–2.4)

## 2021-02-03 LAB — PROCALCITONIN: Procalcitonin: 17.01 ng/mL

## 2021-02-03 MED ORDER — SODIUM CHLORIDE 0.9% IV SOLUTION: Freq: Once | INTRAVENOUS | Status: AC

## 2021-02-03 MED ORDER — ORAL CARE MOUTH RINSE
15.0000 mL | Freq: Two times a day (BID) | OROMUCOSAL | Status: DC
  Administered 2021-02-03 – 2021-02-05 (×4): 15 mL via OROMUCOSAL

## 2021-02-03 MED ORDER — CHLORHEXIDINE GLUCONATE 0.12 % MT SOLN
15.0000 mL | Freq: Two times a day (BID) | OROMUCOSAL | Status: DC
  Administered 2021-02-03 – 2021-02-09 (×9): 15 mL via OROMUCOSAL
  Filled 2021-02-03 (×11): qty 15

## 2021-02-03 NOTE — Progress Notes (Signed)
   CHIEF COMPLAINT:   Chief Complaint  Patient presents with   Abdominal Pain   Chest Pain   Emesis  +gallstone pancreaitis  Subjective  Alert and awake Follows commands Started on AMIO yesterday Not on pressors Baseline BP is low anyways    Objective   Examination:  General exam: Appears calm and comfortable  Respiratory system: Clear to auscultation. Respiratory effort normal. HEENT: [place]/AT, PERRLA, no thrush, no stridor. Cardiovascular system: S1 & S2 heard, RRR. No JVD, murmurs, rubs, gallops or clicks. No pedal edema. Gastrointestinal system: Abdomen is nondistended, soft and nontender. No organomegaly or masses felt. Normal bowel sounds heard. Central nervous system: Alert and oriented. No focal neurological deficits. Extremities: no feet  VITALS:  height is 5\' 11"  (1.803 m) and weight is 100.2 kg. His oral temperature is 97.5 F (36.4 C) (abnormal). His blood pressure is 81/64 (abnormal) and his pulse is 105 (abnormal). His respiration is 13 and oxygen saturation is 95%.   I personally reviewed Labs under Results section.  Radiology Reports DG Wrist Complete Right  Result Date: 02/02/2021 CLINICAL DATA:  Right wrist pain. EXAM: RIGHT WRIST - COMPLETE 3+ VIEW COMPARISON:  None. FINDINGS: There is no acute fracture or dislocation. Degenerative changes with narrowing of the radiocarpal distance and subcortical cystic changes of the carpal bones. Vascular calcifications noted. The soft tissues are grossly unremarkable. IMPRESSION: 1. No acute fracture or dislocation. 2. Degenerative changes. Electronically Signed   By: Anner Crete M.D.   On: 02/02/2021 19:12       Assessment/Plan:  Shock: Hypovolemic/Hemorrhagic vs. 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  [facility]  Medical Director [place] Director [place] Department

## 2021-02-03 NOTE — Progress Notes (Signed)
[facility]  ROUNDING NOTE   Subjective:   Antonio Phillips is a 75 year old male with past medical history including hypotension, obesity, peripheral artery disease, diabetes, hyperlipidemia, and end-stage renal disease on dialysis.  Patient presents to emergency department with abdominal pain persisting for 2 days.  Patient admitted for further evaluation for Choledocholithiasis [K80.50] Pain [R52]  Patient is known to our practice for previous admissions and receives outpatient dialysis at [facility], supervised by Dr. Candiss Norse.  He receives dialysis on a Tuesday Thursday Saturday schedule.    Update: Patient moved over to critical care unit. Currently on amiodarone drip.    Objective:  Vital signs in last 24 hours:  Temp:  [97.2 F (36.2 C)-99.7 F (37.6 C)] 97.2 F (36.2 C) (11/30 1007) Pulse Rate:  [28-154] 28 (11/30 1007) Resp:  [10-25] 12 (11/30 1007) BP: (67-100)/(47-66) 70/54 (11/30 1007) SpO2:  [91 %-100 %] 95 % (11/30 1007) Weight:  [100.2 kg] 100.2 kg (11/29 1555)  Weight change: 1.2 kg Filed Weights   01/31/21 0904 02/01/21 2052 02/02/21 1555  Weight: 97.6 kg 99 kg 100.2 kg    Intake/Output: I/O last 3 completed shifts: In: 2058.5 [I.V.:848.4; Blood:960; IV Piggyback:250.1] Out: 0    Intake/Output this shift:  Total I/O In: 10 [I.V.:10] Out: -   Physical Exam: General: NAD, resting comfortably  Head: Normocephalic, atraumatic. Moist oral mucosal membranes  Eyes: Anicteric  Lungs:  Clear to auscultation, normal effort  Heart: Irregular  Abdomen:  Soft, tender, mild distention  Extremities: No peripheral edema.  Right BKA left AKA  Neurologic: Nonfocal, moving all four extremities  Skin: No lesions  Access: Left AVF    Basic Metabolic Panel: Recent Labs  Lab 02/01/21 0618 02/02/21 0030 02/02/21 0647 02/02/21 1807 02/03/21 0501  NA 136 133* 135 136 133*  K 5.8* 3.9 4.3 4.4 4.5  CL 100 96* 98 100 98  CO2 23 26 26 25 25    GLUCOSE 78 87 87 85 89  BUN 77* 36* 42* 47* 54*  CREATININE 8.12* 4.52* 4.75* 5.06* 5.56*  CALCIUM 8.4* 8.7* 8.4* 8.7* 8.1*  MG  --  1.4*  --   --  1.8  PHOS 8.0*  --   --   --   --      Liver Function Tests: Recent Labs  Lab 01/31/21 0909 02/01/21 0618 02/02/21 0647 02/02/21 1807  AST 67* 51* 40 45*  ALT 44 42 32 38  ALKPHOS 387* 332* 423* 457*  BILITOT 4.2* 3.3* 3.6* 3.8*  PROT 5.7* 5.2* 5.3* 5.5*  ALBUMIN 2.9* 2.8* 2.8* 2.9*    Recent Labs  Lab 01/31/21 0909  LIPASE 1,021*    Recent Labs  Lab 02/02/21 0030  AMMONIA 29     CBC: Recent Labs  Lab 01/31/21 0909 02/01/21 0618 02/02/21 0647 02/02/21 1807 02/03/21 0501  WBC 7.4 5.1 4.0 4.6 4.2  NEUTROABS 6.0  --   --  3.9  --   HGB 8.5* 7.5* 6.9* 10.1* 9.1*  HCT 26.9* 23.7* 21.7* 31.4* 28.0*  MCV 95.4 96.0 95.6 94.0 93.3  PLT 62* 48* 45* 41* 34*     Cardiac Enzymes: No results for input(s): CKTOTAL, CKMB, CKMBINDEX, TROPONINI in the last 168 hours.  BNP: Invalid input(s): POCBNP  CBG: Recent Labs  Lab 02/02/21 1935 02/02/21 2100 02/03/21 0030 02/03/21 0337 02/03/21 0724  GLUCAP 68* 106* 88 41 110*    Microbiology: Results for orders placed or performed during the hospital encounter of 01/31/21  Resp  Panel by RT-PCR (Flu A&B, Covid) Nasopharyngeal Swab     Status: None   Collection Time: 01/31/21  9:11 AM   Specimen: Nasopharyngeal Swab; Nasopharyngeal(NP) swabs in vial transport medium  Result Value Ref Range Status   SARS Coronavirus 2 by RT PCR NEGATIVE NEGATIVE Final    Comment: (NOTE) SARS-CoV-2 target nucleic acids are NOT DETECTED.  The SARS-CoV-2 RNA is generally detectable in upper respiratory specimens during the acute phase of infection. The lowest concentration of SARS-CoV-2 viral copies this assay can detect is 138 copies/mL. A negative result does not preclude SARS-Cov-2 infection and should not be used as the sole basis for treatment or other patient management  decisions. A negative result may occur with  improper specimen collection/handling, submission of specimen other than nasopharyngeal swab, presence of viral mutation(s) within the areas targeted by this assay, and inadequate number of viral copies(<138 copies/mL). A negative result must be combined with clinical observations, patient history, and epidemiological information. The expected result is Negative.  Fact Sheet for Patients:  EntrepreneurPulse.com.au  Fact Sheet for Healthcare Providers:  IncredibleEmployment.be  This test is no t yet approved or cleared by the [place] FDA and  has been authorized for detection and/or diagnosis of SARS-CoV-2 by FDA under an Emergency Use Authorization (EUA). This EUA will remain  in effect (meaning this test can be used) for the duration of the COVID-19 declaration under Section 564(b)(1) of the Act, 21 U.S.C.section 360bbb-3(b)(1), unless the authorization is terminated  or revoked sooner.       Influenza A by PCR NEGATIVE NEGATIVE Final   Influenza B by PCR NEGATIVE NEGATIVE Final    Comment: (NOTE) The Xpert Xpress SARS-CoV-2/FLU/RSV plus assay is intended as an aid in the diagnosis of influenza from Nasopharyngeal swab specimens and should not be used as a sole basis for treatment. Nasal washings and aspirates are unacceptable for Xpert Xpress SARS-CoV-2/FLU/RSV testing.  Fact Sheet for Patients: EntrepreneurPulse.com.au  Fact Sheet for Healthcare Providers: IncredibleEmployment.be  This test is not yet approved or cleared by the [place] FDA and has been authorized for detection and/or diagnosis of SARS-CoV-2 by FDA under an Emergency Use Authorization (EUA). This EUA will remain in effect (meaning this test can be used) for the duration of the COVID-19 declaration under Section 564(b)(1) of the Act, 21 U.S.C. section 360bbb-3(b)(1), unless the  authorization is terminated or revoked.  Performed at [facility], [address] [place], [place] [address]   MRSA Next Gen by PCR, Nasal     Status: None   Collection Time: 02/02/21  9:21 AM   Specimen: Nasal Mucosa; Nasal Swab  Result Value Ref Range Status   MRSA by PCR Next Gen NOT DETECTED NOT DETECTED Final    Comment: (NOTE) The GeneXpert MRSA Assay (FDA approved for NASAL specimens only), is one component of a comprehensive MRSA colonization surveillance program. It is not intended to diagnose MRSA infection nor to guide or monitor treatment for MRSA infections. Test performance is not FDA approved in patients less than 15 years old. Performed at [facility], [address] [place], [place] [address]   CULTURE, BLOOD (ROUTINE X 2) w Reflex to ID Panel     Status: None (Preliminary result)   Collection Time: 02/02/21  6:07 PM   Specimen: BLOOD  Result Value Ref Range Status   Specimen Description BLOOD BLOOD RIGHT HAND  Final   Special Requests   Final    BOTTLES DRAWN AEROBIC AND ANAEROBIC  Blood Culture adequate volume   Culture   Final    NO GROWTH < 24 HOURS Performed at [facility], [address] [place], [place] [address]    Report Status PENDING  Incomplete  CULTURE, BLOOD (ROUTINE X 2) w Reflex to ID Panel     Status: None (Preliminary result)   Collection Time: 02/02/21  6:07 PM   Specimen: BLOOD  Result Value Ref Range Status   Specimen Description BLOOD BLOOD RIGHT FOREARM  Final   Special Requests   Final    BOTTLES DRAWN AEROBIC AND ANAEROBIC Blood Culture adequate volume   Culture   Final    NO GROWTH < 24 HOURS Performed at [facility], [address] [place], [place] [address]    Report Status PENDING  Incomplete    Coagulation Studies: Recent Labs    02/02/21 0647  LABPROT 21.9*  INR 1.9*     Urinalysis: No results for input(s): COLORURINE, LABSPEC, PHURINE, GLUCOSEU, HGBUR, BILIRUBINUR,  KETONESUR, PROTEINUR, UROBILINOGEN, NITRITE, LEUKOCYTESUR in the last 72 hours.  Invalid input(s): APPERANCEUR    Imaging: DG Wrist Complete Right  Result Date: 02/02/2021 CLINICAL DATA:  Right wrist pain. EXAM: RIGHT WRIST - COMPLETE 3+ VIEW COMPARISON:  None. FINDINGS: There is no acute fracture or dislocation. Degenerative changes with narrowing of the radiocarpal distance and subcortical cystic changes of the carpal bones. Vascular calcifications noted. The soft tissues are grossly unremarkable. IMPRESSION: 1. No acute fracture or dislocation. 2. Degenerative changes. Electronically Signed   By: Anner Crete M.D.   On: 02/02/2021 19:12     Medications:    sodium chloride 50 mL/hr at 02/03/21 0615   sodium chloride     sodium chloride     amiodarone 30 mg/hr (02/03/21 0700)   piperacillin-tazobactam (ZOSYN)  IV Stopped (02/03/21 0644)    sodium chloride   Intravenous Once   aspirin EC  81 mg Oral Daily   atorvastatin  40 mg Oral QHS   chlorhexidine  15 mL Mouth Rinse BID   Chlorhexidine Gluconate Cloth  6 each Topical Q0600   clotrimazole  1 application Topical BID   diclofenac Sodium  2 g Topical q AM   [START ON 02/04/2021] epoetin (EPOGEN/PROCRIT) injection  10,000 Units Intravenous Q T,Th,Sa-HD   finasteride  5 mg Oral Daily   loratadine  10 mg Oral Once per day on Sun Mon Wed Fri   mouth rinse  15 mL Mouth Rinse q12n4p   midodrine  10 mg Oral Q T,Th,Sa-HD   midodrine  5 mg Oral Q0600   pantoprazole (PROTONIX) IV  40 mg Intravenous Q12H   pregabalin  75 mg Oral BID   rOPINIRole  1 mg Oral QHS   sevelamer carbonate  1,600 mg Oral 2 times per day on Tue Thu Sat   sevelamer carbonate  1,600 mg Oral 3 times per day on Sun Mon Wed Fri   vitamin B-12  1,000 mcg Oral Daily     Assessment/ Plan:  Antonio Phillips is a 75 y.o.  male with past medical history including hypotension, obesity, peripheral artery disease, diabetes, hyperlipidemia, and end-stage renal  disease on dialysis.  Patient presents to emergency department with abdominal pain persisting for 2 days.  Patient admitted for further evaluation for Choledocholithiasis [K80.50] Pain [R52]  [facility]/TTS/Lt AVF   End-stage renal disease with hyperkalemia on dialysis.  Patient currently off of dialysis schedule today.  We were planning for hemodialysis treatment today.  Currently in the critical care unit and on amiodarone drip.  2. Anemia of chronic kidney disease Lab Results  Component Value Date   HGB 9.1 (L) 02/03/2021    Start the patient on Epogen tomorrow.  3. Secondary Hyperparathyroidism: Lab Results  Component Value Date   CALCIUM 8.1 (L) 02/03/2021   PHOS 8.0 (H) 02/01/2021  Phosphorus high at 8.0 at last check.  Should come down with ongoing dialysis treatments.  Otherwise continue Renvela.    LOS: 3 Alireza Pollack 11/30/202211:04 AM

## 2021-02-03 NOTE — Progress Notes (Signed)
Progress Note  Patient Name: Antonio Phillips Date of Encounter: 02/03/2021  [facility] Cardiologist: Ida Rogue, MD   Subjective   Rapid response called for hypotension and rapid afib, transferred to the ICU, started on IV amiodarone. ERCP canceled, plan for another day. BP still low, HR better. Hgb 9.1 today.   Patient denies chest pain or SOB. He reports wrist pain.   Inpatient Medications    Scheduled Meds:  sodium chloride   Intravenous Once   aspirin EC  81 mg Oral Daily   atorvastatin  40 mg Oral QHS   chlorhexidine  15 mL Mouth Rinse BID   Chlorhexidine Gluconate Cloth  6 each Topical Q0600   clotrimazole  1 application Topical BID   diclofenac Sodium  2 g Topical q AM   [START ON 02/04/2021] epoetin (EPOGEN/PROCRIT) injection  10,000 Units Intravenous Q T,Th,Sa-HD   finasteride  5 mg Oral Daily   loratadine  10 mg Oral Once per day on Sun Mon Wed Fri   mouth rinse  15 mL Mouth Rinse q12n4p   midodrine  10 mg Oral Q T,Th,Sa-HD   midodrine  5 mg Oral Q0600   pantoprazole (PROTONIX) IV  40 mg Intravenous Q12H   pregabalin  75 mg Oral BID   rOPINIRole  1 mg Oral QHS   sevelamer carbonate  1,600 mg Oral 2 times per day on Tue Thu Sat   sevelamer carbonate  1,600 mg Oral 3 times per day on Sun Mon Wed Fri   vitamin B-12  1,000 mcg Oral Daily   Continuous Infusions:  sodium chloride 50 mL/hr at 02/03/21 0615   sodium chloride     sodium chloride     amiodarone 30 mg/hr (02/03/21 0700)   piperacillin-tazobactam (ZOSYN)  IV Stopped (02/03/21 0644)   PRN Meds: sodium chloride, sodium chloride, acetaminophen, alteplase, heparin, HYDROcodone-acetaminophen, lactulose, lidocaine (PF), lidocaine-prilocaine, loperamide, morphine injection, pentafluoroprop-tetrafluoroeth, polyvinyl alcohol, sodium hypochlorite   Vital Signs    Vitals:   02/03/21 0500 02/03/21 0600 02/03/21 0700 02/03/21 0715  BP: (!) 80/62 (!) 81/64 (!) 79/62 (!) 79/62  Pulse: (!) 32 (!) 105  (!) 102 86  Resp: 12 13 11 10   Temp:    97.6 F (36.4 C)  TempSrc:    Axillary  SpO2: 99% 95% 93% 92%  Weight:      Height:        Intake/Output Summary (Last 24 hours) at 02/03/2021 0739 Last data filed at 02/03/2021 0700 Gross per 24 hour  Intake 2058.5 ml  Output --  Net 2058.5 ml   Last 3 Weights 02/02/2021 02/01/2021 02/01/2021  Weight (lbs) 220 lb 14.4 oz 218 lb 4.1 oz (No Data)  Weight (kg) 100.2 kg 99 kg (No Data)      Telemetry    Afib HR 90-100 - Personally Reviewed  ECG    No new - Personally Reviewed  Physical Exam   GEN: No acute distress.   Neck: No JVD Cardiac: Irreg Irreg, no murmurs, rubs, or gallops.  Respiratory: Clear to auscultation bilaterally. GI: Soft, nontender, non-distended  MS: No edema; No deformity. Neuro:  Nonfocal  Psych: Normal affect   Labs    High Sensitivity Troponin:   Recent Labs  Lab 01/31/21 0909 01/31/21 1338 02/02/21 1807 02/02/21 2135  TROPONINIHS 95* 101* 356* 397*     Chemistry Recent Labs  Lab 02/01/21 0618 02/02/21 0030 02/02/21 0647 02/02/21 1807 02/03/21 0501  NA 136 133* 135 136 133*  K 5.8*  3.9 4.3 4.4 4.5  CL 100 96* 98 100 98  CO2 23 26 26 25 25   GLUCOSE 78 87 87 85 89  BUN 77* 36* 42* 47* 54*  CREATININE 8.12* 4.52* 4.75* 5.06* 5.56*  CALCIUM 8.4* 8.7* 8.4* 8.7* 8.1*  MG  --  1.4*  --   --  1.8  PROT 5.2*  --  5.3* 5.5*  --   ALBUMIN 2.8*  --  2.8* 2.9*  --   AST 51*  --  40 45*  --   ALT 42  --  32 38  --   ALKPHOS 332*  --  423* 457*  --   BILITOT 3.3*  --  3.6* 3.8*  --   GFRNONAA 6* 13* 12* 11* 10*  ANIONGAP 13 11 11 11 10     Lipids No results for input(s): CHOL, TRIG, HDL, LABVLDL, LDLCALC, CHOLHDL in the last 168 hours.  Hematology Recent Labs  Lab 02/02/21 0647 02/02/21 1807 02/03/21 0501  WBC 4.0 4.6 4.2  RBC 2.27*  2.25* 3.34* 3.00*  HGB 6.9* 10.1* 9.1*  HCT 21.7* 31.4* 28.0*  MCV 95.6 94.0 93.3  MCH 30.4 30.2 30.3  MCHC 31.8 32.2 32.5  RDW 17.3* 16.9* 17.2*   PLT 45* 41* 34*   Thyroid No results for input(s): TSH, FREET4 in the last 168 hours.  BNP Recent Labs  Lab 01/31/21 0909  BNP 1,051.9*    DDimer No results for input(s): DDIMER in the last 168 hours.   Radiology    DG Wrist Complete Right  Result Date: 02/02/2021 CLINICAL DATA:  Right wrist pain. EXAM: RIGHT WRIST - COMPLETE 3+ VIEW COMPARISON:  None. FINDINGS: There is no acute fracture or dislocation. Degenerative changes with narrowing of the radiocarpal distance and subcortical cystic changes of the carpal bones. Vascular calcifications noted. The soft tissues are grossly unremarkable. IMPRESSION: 1. No acute fracture or dislocation. 2. Degenerative changes. Electronically Signed   By: Anner Crete M.D.   On: 02/02/2021 19:12    Cardiac Studies   Limited 2D echo with Definity 09/28/2020: 1. Left ventricular ejection fraction, by estimation, is 45 to 50%. The  left ventricle has mildly decreased function. Left ventricular endocardial  border not optimally defined to evaluate regional wall motion. There is  mild left ventricular hypertrophy.   2. Right ventricular systolic function is moderately reduced. The right  ventricular size is normal. Mildly increased right ventricular wall  thickness. There is moderately elevated pulmonary artery systolic  pressure.   3. The mitral valve is abnormal. Trivial mitral valve regurgitation.   4. The aortic valve is tricuspid. There is mild thickening of the aortic  valve.   5. The inferior vena cava is dilated in size with <50% respiratory  variability, suggesting right atrial pressure of 15 mmHg.  __________   2D echo 09/27/2020:  1. Poor acoustic windows Endocardium is not well seen I would recomm  limited echo with Definity to help define LVEF and regional wall motion. .  There is mild left ventricular hypertrophy. Left ventricular diastolic  parameters are indeterminate.   2. RV is not seen well enough to evaluate function. .  The right  ventricular size is mildly enlarged.   3. Left atrial size was severely dilated.   4. Right atrial size was severely dilated.   5. Mild mitral valve regurgitation.   6. Tricuspid valve regurgitation is mild to moderate.   7. The aortic valve is tricuspid. Aortic valve regurgitation is not  visualized. Mild aortic valve sclerosis is present, with no evidence of  aortic valve stenosis.  __________   LHC 09/22/2020:   Ost LAD to Prox LAD lesion is 50% stenosed.   2nd Mrg lesion is 99% stenosed.   A drug-eluting stent was successfully placed using a STENT RESOLUTE ONYX 2.5X15.   Post intervention, there is a 30% residual stenosis.   1.  Severe single-vessel coronary artery disease with 99% subtotal stenosis of the second obtuse marginal branch, treated with a 2.5 x 15 mm resolute Onyx DES.  30% residual stenosis at the case completion due to resistant/calcified lesion type even with high-pressure noncompliant balloon angioplasty. 2.  Mild to moderate nonobstructive proximal LAD stenosis 3.  Mild nonobstructive RCA stenosis  Recommendations: Aggressive medical therapy, as long as no bleeding complications arise, would resume apixaban tomorrow, aspirin 81 mg daily x30 days, and clopidogrel 75 mg daily x minimum 6 months. ___________   2D echo 09/21/2020: 1. Left ventricular ejection fraction, by estimation, is 60 to 65%. The  left ventricle has normal function. The left ventricle has no regional  wall motion abnormalities. Left ventricular diastolic parameters are  indeterminate.   2. Right ventricular systolic function is normal. The right ventricular  size is normal. Tricuspid regurgitation signal is inadequate for assessing  PA pressure.   3. Left atrial size was moderately dilated.   4. Right atrial size was moderately dilated.   5. Tricuspid valve regurgitation is mild to moderate.   6. The mitral valve is normal in structure. Mild mitral valve  regurgitation.   7.  Rhythym is atrial fibrillation     Patient Profile     75 y.o. male with a hx of PAD s/p L BKA and R BKA, permanent Afib on Eliquis, ESRD on HD, hypotension, morbid obesity, h/o diabetes, demand ischemia who is being seen 02/01/2021 for the evaluation of pre-op cardiac evaluation  Assessment & Plan    Shock Hypovolemic/hemorrhagic vs septic Acute GIB - Rapid response called 11/29 for hypotension and afib RVR, exacerbated by acute anemia. transferred to the ICU. ERCP canceled - started on IV amiodarone - s/p PRBCs - IV PPI - IV abx  Abdominal pain Choledocolithiasis Biliary pancreatitis - presented with abdominal pain found to have acute pancreatitis 2/2 choledocholithiasis. Cardiology initially saw for pre-op.  - lipase elevated to 1000 - CT abd pelvis with evidence of acute pancreatitis as well as cholelithiasis including a 1.4cm calculus in the distal CBD, bilary ductal dilation suggestive of obstruction - GI consulted and plan on ERCP - patient was stented 09/22/20 to 2nd Mrg, recommended Plavix x 6 months. Per MD ok to hold Plavix and Eliquis for upcoming procedure.  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   - BP still low - goal MAP>65   Elevated troponin -  95>101 - suspect demand ischemia in the setting of the above issues - No chest pain reported - No further ischemic work-up  For questions or updates, please contact [facility] Please consult [email or web address] for contact info under        Signed, Treson Laura Ninfa Meeker, PA-C  02/03/2021, 7:39 AM

## 2021-02-03 NOTE — Consult Note (Signed)
Consultation Note Date: 02/03/2021   Patient Name: Antonio Phillips  DOB: 1946-02-26  MRN: [id number]  Age / Sex: 75 y.o., male  PCP: Marsh Dolly, MD Referring Physician: Lorella Nimrod, MD  Reason for Consultation: Establishing goals of care  HPI/Patient Profile: 75 y.o. male  with past medical history of HTN, HLD, morbid obesity, PAD, ESRD (HD), A. fib (Eliquis), and wheelchair-bound admitted on 01/31/2021 with complaints of abdominal and epigastric pain.  CT of abdomen and pelvis revealed splenomegaly mild pancreatic and peripancreatic inflammatory fat standing and cholelithiasis. Patient also found to have multifactorial shock and acute GI bleed.Plan was for ERCP. rapid response was called for low blood pressure and patient was subsequently transferred to ICU.  Palliative medicine was consulted to discuss goals of care.  Clinical Assessment and Goals of Care: I have reviewed medical records including EPIC notes, labs and imaging, assessed the patient and then met with patient at bedside to discuss diagnosis prognosis, GOC, EOL wishes, disposition and options.  I introduced Palliative Medicine as specialized medical care for people living with serious illness. It focuses on providing relief from the symptoms and stress of a serious illness. The goal is to improve quality of life for both the patient and the family.  We discussed a brief life review of the patient.  He is an Scientist, research (life sciences).  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  He was homeless for some time before becoming a long-term resident at [facility] about a year ago.  He is divorced and never had any children.  He shares he has no living relatives.  As far as functional and nutritional status prior to admission patient had just received a motorized wheelchair  and was learning how to use it.  He endorses a healthy appetite.  He denies any changes in his cognitive function prior to admission.  We discussed patient's current illness and what it means in the larger context of patient's on-going co-morbidities.  Natural disease trajectory and expectations at EOL were discussed.  I attempted to elicit values and goals of care important to the patient.  Patient shares he is 57 and has outlived all of his family and friends.  He also states he must be here for reason.  Concepts of advance directives and code status were introduced.  I shared that as it stands right now he is a full code.  I discussed other options such as limited code and allowing a natural death.  He said he did not know there were other choices.   Discussed with patient the importance of continued conversation with family and the medical providers regarding overall plan of care and treatment options, ensuring decisions are within the context of the patient's values and GOCs.  I assured him I would return tomorrow and we would speak in further detail regarding advanced care planning.  Questions and concerns were addressed. The family was encouraged to call with questions or  concerns.   Primary Decision Maker PATIENT  Code Status/Advance Care Planning: Full code  Prognosis:   Unable to determine  Discharge Planning: To Be Determined  Primary Diagnoses: Present on Admission:  ESRD (end stage renal disease) (HCC)  Abdominal pain  Choledocholithiasis  Atrial fibrillation, chronic (HCC)  Morbid obesity (HCC)  Wound of skin   Physical Exam Constitutional:      General: He is not in acute distress.    Appearance: He is obese. He is not ill-appearing.  HENT:     Head: Normocephalic and atraumatic.  Cardiovascular:     Rate and Rhythm: Rhythm irregular.  Pulmonary:     Effort: Pulmonary effort is normal.  Abdominal:     Palpations: Abdomen is soft.  Skin:    General: Skin is  warm and dry.     Comments: Wounds - UTA as pt is currently receiving HD  Neurological:     Mental Status: He is alert and oriented to person, place, and time.  Psychiatric:        Mood and Affect: Mood normal. Mood is not anxious.        Behavior: Behavior normal.    Vital Signs: BP (!) 84/60   Pulse (!) 118   Temp 97.6 F (36.4 C) (Axillary)   Resp 13   Ht 5' 11" (1.803 m)   Wt 100.2 kg   SpO2 98%   BMI 30.81 kg/m  Pain Scale: 0-10   Pain Score: 5  SpO2: SpO2: 98 % O2 Device:SpO2: 98 % O2 Flow Rate: .O2 Flow Rate (L/min): 2 L/min  Palliative Assessment/Data: 50%     I discussed this patient's plan of care with patient.  Thank you for this consult. Palliative medicine will continue to follow and assist holistically.   Time Total: 70 minutes Greater than 50%  of this time was spent counseling and coordinating care related to the above assessment and plan.  Signed by: Kathryn Strup, DNP, FNP-BC Palliative Medicine    Please contact Palliative Medicine Team phone at [phone] for questions and concerns.  For individual provider: See Amion               

## 2021-02-03 NOTE — Progress Notes (Signed)
PROGRESS NOTE    Antonio Phillips  HYI:[id number] DOB: Jul 23, 1945 DOA: 01/31/2021 PCP: Marsh Dolly, MD   Brief Narrative: Taken from prior notes. Antonio Phillips, 75 y.o. 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 fib on Eliquis, HLD, chronic hypotension on midodrine, morbid obesity who presented from nursing home with complaints of abdominal pain and emesis of 2-3 days duration.    Seen in the ED,cxr pulmonary edema,at least moderate pleural effusion with compressive atelectasis, CT chest abdomen pelvis showed acute pancreatitis, choledocholithiasis 1.4 cm calculus in the distal CBD with biliary duct dilatation suggesting obstruction .Clinically no concerns for acute cholangitis however.  ERCP was advised by Gastroenterology.  Initially canceled due to persistent hypotension, received 2 units of PRBC for hematochezia.  Holding Eliquis and Plavix. Transferred to stepdown overnight due to worsening hypotension, map goal of 55. Most likely will go for ERCP tomorrow if remains stable.  Patient is high risk for deterioration and death based on multiple life limiting comorbidities.  Palliative care was also consulted but patient would like to remain full code.  Subjective: Patient was having some colicky right upper quadrant pain.  Denies any nausea or vomiting.  Denies any dizziness, chest pain or shortness of breath.  Assessment & Plan:   Principal Problem:   Abdominal pain Active Problems:   ESRD (end stage renal disease) (HCC)   Atrial fibrillation, chronic (HCC)   Wound of skin   Choledocholithiasis   Morbid obesity (HCC)   Normocytic anemia   Atrial fibrillation with rapid ventricular response (HCC)  Acute on chronic anemia.  Most likely some GI loss.  Going for ERCP with GI.  Received 2 unit of PRBC.  PPI was initiated.  Hemoglobin improved to 10.1 after getting blood transfusions, started trending down again.   Might be some element of bone marrow suppression with recent infection. -Continue to monitor -Transfuse if below 8  Acute biliary pancreatitis secondary to choledocholithiasis.  Lipase greater than 1000 on admission.  This was confirmed on CT scan.  GI was consulted and he was supposed to get ERCP today which was canceled due to persistent hypotension.  Most likely will get it tomorrow. He was placed on Zosyn overnight due to worsening hypotension. Preliminary blood cultures negative in 24 hours. -Continue with Zosyn  Acute on chronic thrombocytopenia.  Baseline platelet count around 100.  Hematology was consulted and they are recommending 1 unit of platelet before the ERCP and 1 during the procedure. -Recommending to proceed with platelet transfusion is started bleeding or if counts drop below 15. -Some concern of peripheral destruction but they are not recommending steroid at this time, might need steroid and/or IVIG. -Appreciate hematology recommendations.  Acute on chronic hypotension.  Some worsening might be due to GI bleed.  On midodrine at baseline.  Goal MAP of 55. -Continue with midodrine -Continue to monitor  Permanent atrial fibrillation.  Cardiology is on board, he was placed on amiodarone. -Home dose of Eliquis is on hold for a possible ERCP and acute GI blood loss.  He also has worsening thrombocytopenia. -Continue to monitor  CAD with severe single-vessel disease.  Medical management per cardiology at this time. -Holding Eliquis and Plavix.  Multiple decubitus ulcers.  Wound care was consulted -Continue with wound care  History of PAD.  S/p bilateral lower extremity amputations. -Continue with aspirin and statin -Holding Eliquis and Plavix   Objective: Vitals:   02/03/21  1600 02/03/21 1615 02/03/21 1630 02/03/21 1645  BP: (!) 82/59 (!) 83/63 (!) 88/61 (!) 82/58  Pulse: 93 80 71 86  Resp: 14 10 11 15   Temp:      TempSrc:      SpO2: 98% 97% 97% 96%  Weight:       Height:        Intake/Output Summary (Last 24 hours) at 02/03/2021 1658 Last data filed at 02/03/2021 1600 Gross per 24 hour  Intake 3295.88 ml  Output --  Net 3295.88 ml   Filed Weights   02/01/21 2052 02/02/21 1555  Weight: 99 kg 100.2 kg    Examination:  General exam: Appears calm and comfortable  Respiratory system: Clear to auscultation. Respiratory effort normal. Cardiovascular system: Irregularly irregular Gastrointestinal system: Soft, RUQ tenderness, nondistended, bowel sounds positive. Central nervous system: Alert and oriented. No focal neurological deficits. Extremities: Right BKA, left AKA Psychiatry: Judgement and insight appear normal.    DVT prophylaxis: SCDs, holding home Eliquis for a potential ERCP tomorrow Code Status: Full Family Communication:  Disposition Plan:  Status is: Inpatient  Remains inpatient appropriate because: Severity of illness   Level of care: Stepdown  All the records are reviewed and case discussed with Care Management/Social Worker. Management plans discussed with the patient, nursing and they are in agreement.  Consultants:  PCCM Cardiology GI Nephrology  Procedures:  Antimicrobials:  Zosyn  Data Reviewed: I have personally reviewed following labs and imaging studies  CBC: Recent Labs  Lab 01/31/21 0909 02/01/21 0618 02/02/21 0647 02/02/21 1807 02/03/21 0501 02/03/21 1302  WBC 7.4 5.1 4.0 4.6 4.2 6.0  NEUTROABS 6.0  --   --  3.9  --  4.4  HGB 8.5* 7.5* 6.9* 10.1* 9.1* 9.6*  HCT 26.9* 23.7* 21.7* 31.4* 28.0* 30.2*  MCV 95.4 96.0 95.6 94.0 93.3 94.7  PLT 62* 48* 45* 41* 34* 57*   Basic Metabolic Panel: Recent Labs  Lab 02/01/21 0618 02/02/21 0030 02/02/21 0647 02/02/21 1807 02/03/21 0501  NA 136 133* 135 136 133*  K 5.8* 3.9 4.3 4.4 4.5  CL 100 96* 98 100 98  CO2 23 26 26 25 25   GLUCOSE 78 87 87 85 89  BUN 77* 36* 42* 47* 54*  CREATININE 8.12* 4.52* 4.75* 5.06* 5.56*  CALCIUM 8.4* 8.7* 8.4*  8.7* 8.1*  MG  --  1.4*  --   --  1.8  PHOS 8.0*  --   --   --   --    GFR: Estimated Creatinine Clearance: 13.6 mL/min (A) (by C-G formula based on SCr of 5.56 mg/dL (H)). Liver Function Tests: Recent Labs  Lab 01/31/21 0909 02/01/21 0618 02/02/21 0647 02/02/21 1807 02/03/21 1302  AST 67* 51* 40 45* 34  ALT 44 42 32 38 30  ALKPHOS 387* 332* 423* 457* 364*  BILITOT 4.2* 3.3* 3.6* 3.8* 2.6*  PROT 5.7* 5.2* 5.3* 5.5* 5.5*  ALBUMIN 2.9* 2.8* 2.8* 2.9* 2.7*   Recent Labs  Lab 01/31/21 0909  LIPASE 1,021*   Recent Labs  Lab 02/02/21 0030  AMMONIA 29   Coagulation Profile: Recent Labs  Lab 02/02/21 0647  INR 1.9*   Cardiac Enzymes: No results for input(s): CKTOTAL, CKMB, CKMBINDEX, TROPONINI in the last 168 hours. BNP (last 3 results) No results for input(s): PROBNP in the last 8760 hours. HbA1C: No results for input(s): HGBA1C in the last 72 hours. CBG: Recent Labs  Lab 02/03/21 0030 02/03/21 0337 02/03/21 0724 02/03/21 1123 02/03/21 1626  GLUCAP  88 81 110* 133* 110*   Lipid Profile: No results for input(s): CHOL, HDL, LDLCALC, TRIG, CHOLHDL, LDLDIRECT in the last 72 hours. Thyroid Function Tests: No results for input(s): TSH, T4TOTAL, FREET4, T3FREE, THYROIDAB in the last 72 hours. Anemia Panel: Recent Labs    02/02/21 0647  VITAMINB12 2,319*  FOLATE 10.6  FERRITIN 1,707*  TIBC 118*  IRON 23*  RETICCTPCT 1.3   Sepsis Labs: Recent Labs  Lab 02/02/21 1807 02/02/21 2135 02/03/21 0430  PROCALCITON 21.06  --  17.01  LATICACIDVEN 1.7 2.1*  --     Recent Results (from the past 240 hour(s))  Resp Panel by RT-PCR (Flu A&B, Covid) Nasopharyngeal Swab     Status: None   Collection Time: 01/31/21  9:11 AM   Specimen: Nasopharyngeal Swab; Nasopharyngeal(NP) swabs in vial transport medium  Result Value Ref Range Status   SARS Coronavirus 2 by RT PCR NEGATIVE NEGATIVE Final    Comment: (NOTE) SARS-CoV-2 target nucleic acids are NOT DETECTED.  The  SARS-CoV-2 RNA is generally detectable in upper respiratory specimens during the acute phase of infection. The lowest concentration of SARS-CoV-2 viral copies this assay can detect is 138 copies/mL. A negative result does not preclude SARS-Cov-2 infection and should not be used as the sole basis for treatment or other patient management decisions. A negative result may occur with  improper specimen collection/handling, submission of specimen other than nasopharyngeal swab, presence of viral mutation(s) within the areas targeted by this assay, and inadequate number of viral copies(<138 copies/mL). A negative result must be combined with clinical observations, patient history, and epidemiological information. The expected result is Negative.  Fact Sheet for Patients:  EntrepreneurPulse.com.au  Fact Sheet for Healthcare Providers:  IncredibleEmployment.be  This test is no t yet approved or cleared by the [place] FDA and  has been authorized for detection and/or diagnosis of SARS-CoV-2 by FDA under an Emergency Use Authorization (EUA). This EUA will remain  in effect (meaning this test can be used) for the duration of the COVID-19 declaration under Section 564(b)(1) of the Act, 21 U.S.C.section 360bbb-3(b)(1), unless the authorization is terminated  or revoked sooner.       Influenza A by PCR NEGATIVE NEGATIVE Final   Influenza B by PCR NEGATIVE NEGATIVE Final    Comment: (NOTE) The Xpert Xpress SARS-CoV-2/FLU/RSV plus assay is intended as an aid in the diagnosis of influenza from Nasopharyngeal swab specimens and should not be used as a sole basis for treatment. Nasal washings and aspirates are unacceptable for Xpert Xpress SARS-CoV-2/FLU/RSV testing.  Fact Sheet for Patients: EntrepreneurPulse.com.au  Fact Sheet for Healthcare Providers: IncredibleEmployment.be  This test is not yet approved or  cleared by the [place] FDA and has been authorized for detection and/or diagnosis of SARS-CoV-2 by FDA under an Emergency Use Authorization (EUA). This EUA will remain in effect (meaning this test can be used) for the duration of the COVID-19 declaration under Section 564(b)(1) of the Act, 21 U.S.C. section 360bbb-3(b)(1), unless the authorization is terminated or revoked.  Performed at [facility], [address] [place], [place] [address]   MRSA Next Gen by PCR, Nasal     Status: None   Collection Time: 02/02/21  9:21 AM   Specimen: Nasal Mucosa; Nasal Swab  Result Value Ref Range Status   MRSA by PCR Next Gen NOT DETECTED NOT DETECTED Final    Comment: (NOTE) The GeneXpert MRSA Assay (FDA approved for NASAL specimens only), is one component of a comprehensive MRSA  colonization surveillance program. It is not intended to diagnose MRSA infection nor to guide or monitor treatment for MRSA infections. Test performance is not FDA approved in patients less than 2 years old. Performed at [facility], [address] [place], [place] [address]   CULTURE, BLOOD (ROUTINE X 2) w Reflex to ID Panel     Status: None (Preliminary result)   Collection Time: 02/02/21  6:07 PM   Specimen: BLOOD  Result Value Ref Range Status   Specimen Description BLOOD BLOOD RIGHT HAND  Final   Special Requests   Final    BOTTLES DRAWN AEROBIC AND ANAEROBIC Blood Culture adequate volume   Culture   Final    NO GROWTH < 24 HOURS Performed at [facility], [address], [place], [place] [address]    Report Status PENDING  Incomplete  CULTURE, BLOOD (ROUTINE X 2) w Reflex to ID Panel     Status: None (Preliminary result)   Collection Time: 02/02/21  6:07 PM   Specimen: BLOOD  Result Value Ref Range Status   Specimen Description BLOOD BLOOD RIGHT FOREARM  Final   Special Requests   Final    BOTTLES DRAWN AEROBIC AND ANAEROBIC Blood Culture adequate volume   Culture    Final    NO GROWTH < 24 HOURS Performed at [facility], [address], [place], [place] [address]    Report Status PENDING  Incomplete     Radiology Studies: DG Wrist Complete Right  Result Date: 02/02/2021 CLINICAL DATA:  Right wrist pain. EXAM: RIGHT WRIST - COMPLETE 3+ VIEW COMPARISON:  None. FINDINGS: There is no acute fracture or dislocation. Degenerative changes with narrowing of the radiocarpal distance and subcortical cystic changes of the carpal bones. Vascular calcifications noted. The soft tissues are grossly unremarkable. IMPRESSION: 1. No acute fracture or dislocation. 2. Degenerative changes. Electronically Signed   By: Anner Crete M.D.   On: 02/02/2021 19:12    Scheduled Meds:  sodium chloride   Intravenous Once   aspirin EC  81 mg Oral Daily   atorvastatin  40 mg Oral QHS   chlorhexidine  15 mL Mouth Rinse BID   Chlorhexidine Gluconate Cloth  6 each Topical Q0600   clotrimazole  1 application Topical BID   diclofenac Sodium  2 g Topical q AM   [START ON 02/04/2021] epoetin (EPOGEN/PROCRIT) injection  10,000 Units Intravenous Q T,Th,Sa-HD   finasteride  5 mg Oral Daily   loratadine  10 mg Oral Once per day on Sun Mon Wed Fri   mouth rinse  15 mL Mouth Rinse q12n4p   midodrine  10 mg Oral Q T,Th,Sa-HD   midodrine  5 mg Oral Q0600   pantoprazole (PROTONIX) IV  40 mg Intravenous Q12H   pregabalin  75 mg Oral BID   rOPINIRole  1 mg Oral QHS   sevelamer carbonate  1,600 mg Oral 2 times per day on Tue Thu Sat   sevelamer carbonate  1,600 mg Oral 3 times per day on Sun Mon Wed Fri   vitamin B-12  1,000 mcg Oral Daily   Continuous Infusions:  sodium chloride 100 mL/hr at 02/03/21 1139   sodium chloride     sodium chloride     amiodarone 30 mg/hr (02/03/21 1600)   piperacillin-tazobactam (ZOSYN)  IV 2.25 g (02/03/21 1302)     LOS: 3 days   Time spent: 50 minutes. More than 50% of the time was spent in counseling/coordination of care  [facility]  Reesa Chew, MD [facility]  If 7PM-7AM, please contact night-coverage [email or web address]  02/03/2021, 4:58 PM   This record has been created using Systems analyst. Errors have been sought and corrected,but may not always be located. Such creation errors do not reflect on the standard of care.

## 2021-02-03 NOTE — Progress Notes (Signed)
Hematology/Oncology Consult note [facility]  Telephone:([phone] [phone]  Patient Care Team: Marsh Dolly, MD as PCP - General (Internal Medicine) Minna Merritts, MD as PCP - Cardiology (Cardiology)   Name of the patient: Antonio Phillips  [id number]  [birth year]   Date of visit: 02/03/2021   Interval history-ERCP was canceled yesterday after patient was in A. fib with RVR.  Possible ERCP tomorrow.  Patient appears in better spirits today and is up and eating in his bed.  ECOG PS- 3   Review of systems- Review of Systems  Constitutional:  Positive for malaise/fatigue. Negative for chills, fever and weight loss.  HENT:  Negative for congestion, ear discharge and nosebleeds.   Eyes:  Negative for blurred vision.  Respiratory:  Negative for cough, hemoptysis, sputum production, shortness of breath and wheezing.   Cardiovascular:  Negative for chest pain, palpitations, orthopnea and claudication.  Gastrointestinal:  Negative for abdominal pain, blood in stool, constipation, diarrhea, heartburn, melena, nausea and vomiting.  Genitourinary:  Negative for dysuria, flank pain, frequency, hematuria and urgency.  Musculoskeletal:  Negative for back pain, joint pain and myalgias.  Skin:  Negative for rash.  Neurological:  Negative for dizziness, tingling, focal weakness, seizures, weakness and headaches.  Endo/Heme/Allergies:  Does not bruise/bleed easily.  Psychiatric/Behavioral:  Negative for depression and suicidal ideas. The patient does not have insomnia.      Allergies  Allergen Reactions   Simvastatin Other (See Comments)     Past Medical History:  Diagnosis Date   Demand ischemia ([place])    a. 11/2017 elev Trop in setting of AFib/SVT-->[facility]): EF>55%. Mild LVH. Nl RV fxn.   Diabetes ([place])    a. Pt states that he has no hx of diabetes--A1c 5.5 11/2018.   ESRD (end stage renal disease) ([place])    a. On HD since 2019.    Hyperlipidemia    Hypotension    a. On midodrine.   Morbid obesity ([place])    PAD (peripheral artery disease) ([place])    a. s/p L AKA and R BKA.   Permanent atrial fibrillation ([place])    a. Dx 2019. CHA2DS2VASc = 3-4-->Eliquis.     Past Surgical History:  Procedure Laterality Date   AV FISTULA REPAIR     BELOW KNEE LEG AMPUTATION Bilateral    CHOLECYSTECTOMY     LEFT HEART CATH AND CORONARY ANGIOGRAPHY N/A 09/22/2020   Procedure: LEFT HEART CATH AND CORONARY ANGIOGRAPHY;  Surgeon: Sherren Mocha, MD;  Location: [place] [facility];  Service: Cardiovascular;  Laterality: N/A;   TOTAL HIP ARTHROPLASTY Bilateral     Social History   Socioeconomic History   Marital status: Single    Spouse name: Not on file   Number of children: Not on file   Years of education: Not on file   Highest education level: Not on file  Occupational History   Not on file  Tobacco Use   Smoking status: Never   Smokeless tobacco: Never  Substance and Sexual Activity   Alcohol use: Never   Drug use: Never   Sexual activity: Not on file  Other Topics Concern   Not on file  Social History Narrative   Lives @ local nsg facility.  Does not routinely exercise.   Social Determinants of Health   Financial Resource Strain: Not on file  Food Insecurity: Not on file  Transportation Needs: Not on file  Physical Activity: Not on file  Stress: Not on file  Social Connections: Not on file  Intimate Partner Violence: Not on file    History reviewed. No pertinent family history.   Current Facility-Administered Medications:    0.9 %  sodium chloride infusion (Manually program via Guardrails IV Fluids), , Intravenous, Once, Acheampong, Warnell Bureau, MD, Stopped at 02/02/21 1730   0.9 %  sodium chloride infusion, , Intravenous, Continuous, Lorella Nimrod, MD, Last Rate: 100 mL/hr at 02/03/21 1139, Rate Change at 02/03/21 1139   0.9 %  sodium chloride infusion, 100 mL, Intravenous, PRN, Breeze, Shantelle, NP   0.9 %   sodium chloride infusion, 100 mL, Intravenous, PRN, Colon Flattery, NP   acetaminophen (TYLENOL) tablet 650 mg, 650 mg, Oral, Q6H PRN, Cristal Deer, MD   alteplase (CATHFLO ACTIVASE) injection 2 mg, 2 mg, Intracatheter, Once PRN, Colon Flattery, NP   [COMPLETED] amiodarone (NEXTERONE) 1.8 mg/mL load via infusion 150 mg, 150 mg, Intravenous, Once, 150 mg at 02/02/21 2042 **FOLLOWED BY** [EXPIRED] amiodarone (NEXTERONE PREMIX) 360-4.14 MG/200ML-% (1.8 mg/mL) IV infusion, 60 mg/hr, Intravenous, Continuous, Last Rate: 16.67 mL/hr at 02/03/21 0200, 30 mg/hr at 02/03/21 0200 **FOLLOWED BY** amiodarone (NEXTERONE PREMIX) 360-4.14 MG/200ML-% (1.8 mg/mL) IV infusion, 30 mg/hr, Intravenous, Continuous, Ouma, Bing Neighbors, NP, Last Rate: 16.67 mL/hr at 02/03/21 1303, 30 mg/hr at 02/03/21 1303   aspirin EC tablet 81 mg, 81 mg, Oral, Daily, Furth, Cadence H, PA-C, 81 mg at 02/03/21 0959   atorvastatin (LIPITOR) tablet 40 mg, 40 mg, Oral, QHS, Cristal Deer, MD, 40 mg at 02/01/21 2220   chlorhexidine (PERIDEX) 0.12 % solution 15 mL, 15 mL, Mouth Rinse, BID, Ouma, Bing Neighbors, NP, 15 mL at 02/03/21 3154   Chlorhexidine Gluconate Cloth 2 % PADS 6 each, 6 each, Topical, Q0600, Acheampong, Warnell Bureau, MD, 6 each at 02/03/21 1003   clotrimazole (LOTRIMIN) 1 % cream 1 application, 1 application, Topical, BID, Cristal Deer, MD, 1 application at 00/86/76 1950   diclofenac Sodium (VOLTAREN) 1 % topical gel 2 g, 2 g, Topical, q AM, Cristal Deer, MD, 2 g at 02/03/21 0547   [START ON 02/04/2021] epoetin alfa (EPOGEN) injection 10,000 Units, 10,000 Units, Intravenous, Q T,Th,Sa-HD, Breeze, Benancio Deeds, NP   finasteride (PROSCAR) tablet 5 mg, 5 mg, Oral, Daily, Cristal Deer, MD, 5 mg at 02/03/21 0958   heparin injection 1,000 Units, 1,000 Units, Dialysis, PRN, Colon Flattery, NP   HYDROcodone-acetaminophen (NORCO/VICODIN) 5-325 MG per tablet 1 tablet, 1 tablet, Oral, Q6H PRN, Cristal Deer, MD,  1 tablet at 02/02/21 1241   lactulose (CHRONULAC) 10 GM/15ML solution 10 g, 10 g, Oral, BID PRN, Cristal Deer, MD   lidocaine (PF) (XYLOCAINE) 1 % injection 5 mL, 5 mL, Intradermal, PRN, Breeze, Shantelle, NP   lidocaine-prilocaine (EMLA) cream 1 application, 1 application, Topical, PRN, Gwyneth Revels, Shantelle, NP   loperamide (IMODIUM) capsule 4 mg, 4 mg, Oral, QID PRN, Cristal Deer, MD   loratadine (CLARITIN) tablet 10 mg, 10 mg, Oral, Once per day on Sun Mon Wed Fri, Ugah, Nwannadiya, MD, 10 mg at 02/03/21 1002   MEDLINE mouth rinse, 15 mL, Mouth Rinse, q12n4p, Ouma, Bing Neighbors, NP, 15 mL at 02/03/21 1247   midodrine (PROAMATINE) tablet 10 mg, 10 mg, Oral, Q T,Th,Sa-HD, Kc, Ramesh, MD, 10 mg at 02/02/21 1241   midodrine (PROAMATINE) tablet 5 mg, 5 mg, Oral, Q0600, Kc, Ramesh, MD, 5 mg at 02/03/21 0545   morphine 2 MG/ML injection 2 mg, 2 mg, Intravenous, Q4H PRN, Mansy, Jan A, MD, 2 mg at 01/31/21 2337   pantoprazole (PROTONIX) injection 40  mg, 40 mg, Intravenous, Q12H, Acheampong, Warnell Bureau, MD, 40 mg at 02/03/21 1000   pentafluoroprop-tetrafluoroeth (GEBAUERS) aerosol 1 application, 1 application, Topical, PRN, Breeze, Shantelle, NP   piperacillin-tazobactam (ZOSYN) IVPB 2.25 g, 2.25 g, Intravenous, Q8H, Beers, Shanon Brow, RPH, Last Rate: 100 mL/hr at 02/03/21 1302, 2.25 g at 02/03/21 1302   polyvinyl alcohol (LIQUIFILM TEARS) 1.4 % ophthalmic solution 1 drop, 1 drop, Both Eyes, TID PRN, Cristal Deer, MD   pregabalin (LYRICA) capsule 75 mg, 75 mg, Oral, BID, Cristal Deer, MD, 75 mg at 02/03/21 4825   rOPINIRole (REQUIP) tablet 1 mg, 1 mg, Oral, QHS, Cristal Deer, MD, 1 mg at 02/01/21 2220   sevelamer carbonate (RENVELA) tablet 1,600 mg, 1,600 mg, Oral, 2 times per day on Tue Thu Sat, Kc, Ramesh, MD   sevelamer carbonate (RENVELA) tablet 1,600 mg, 1,600 mg, Oral, 3 times per day on Sun Mon Wed Fri, Grubb, Rodney D, RPH, 1,600 mg at 02/03/21 1300   sodium hypochlorite (DAKIN'S  1/4 STRENGTH) topical solution 1 application, 1 application, Topical, BID PRN, Cristal Deer, MD   vitamin B-12 (CYANOCOBALAMIN) tablet 1,000 mcg, 1,000 mcg, Oral, Daily, Cristal Deer, MD, 1,000 mcg at 02/03/21 0959  Physical exam:  Vitals:   02/03/21 1118 02/03/21 1133 02/03/21 1200 02/03/21 1245  BP: (!) 83/60 (!) 74/61 (!) 74/54 (!) 84/60  Pulse: (!) 108 74 92 (!) 118  Resp: 12 13 11 13   Temp: (!) 97.5 F (36.4 C) 97.7 F (36.5 C)  97.6 F (36.4 C)  TempSrc: Axillary Axillary  Axillary  SpO2: 95% 95% 95% 98%  Weight:      Height:       Physical Exam Cardiovascular:     Rate and Rhythm: Normal rate and regular rhythm.     Heart sounds: Normal heart sounds.  Pulmonary:     Effort: Pulmonary effort is normal.     Breath sounds: Normal breath sounds.  Abdominal:     General: Bowel sounds are normal.     Palpations: Abdomen is soft.  Skin:    General: Skin is warm and dry.  Neurological:     Mental Status: He is alert and oriented to person, place, and time.     CMP Latest Ref Rng & Units 02/03/2021  Glucose 70 - 99 mg/dL 89  BUN 8 - 23 mg/dL 54(H)  Creatinine 0.61 - 1.24 mg/dL 5.56(H)  Sodium 135 - 145 mmol/L 133(L)  Potassium 3.5 - 5.1 mmol/L 4.5  Chloride 98 - 111 mmol/L 98  CO2 22 - 32 mmol/L 25  Calcium 8.9 - 10.3 mg/dL 8.1(L)  Total Protein 6.5 - 8.1 g/dL -  Total Bilirubin 0.3 - 1.2 mg/dL -  Alkaline Phos 38 - 126 U/L -  AST 15 - 41 U/L -  ALT 0 - 44 U/L -   CBC Latest Ref Rng & Units 02/03/2021  WBC 4.0 - 10.5 K/uL 4.2  Hemoglobin 13.0 - 17.0 g/dL 9.1(L)  Hematocrit 39.0 - 52.0 % 28.0(L)  Platelets 150 - 400 K/uL 34(L)    @IMAGES @  DG Wrist Complete Right  Result Date: 02/02/2021 CLINICAL DATA:  Right wrist pain. EXAM: RIGHT WRIST - COMPLETE 3+ VIEW COMPARISON:  None. FINDINGS: There is no acute fracture or dislocation. Degenerative changes with narrowing of the radiocarpal distance and subcortical cystic changes of the carpal bones. Vascular  calcifications noted. The soft tissues are grossly unremarkable. IMPRESSION: 1. No acute fracture or dislocation. 2. Degenerative changes. Electronically Signed   By: Milas Hock  Radparvar M.D.   On: 02/02/2021 19:12   DG Chest Port 1 View  Result Date: 01/31/2021 CLINICAL DATA:  Chest pain. EXAM: PORTABLE CHEST 1 VIEW COMPARISON:  Chest radiograph 09/26/2020; CT chest 09/20/2020 FINDINGS: Of note, patient is mildly rotated on this portable exam. Low lung volumes with confluent opacity throughout the left lung. Diffuse hazy interstitial thickening in the right lung without focal consolidation. No pneumothorax. Heart size is difficult to assess due to adjacent consolidations. Aortic calcifications. No mediastinal shift accounting for patient positioning. No acute osseous abnormality. IMPRESSION: Pulmonary edema with confluent opacity in the left lung favored to represent combination of at least moderate pleural effusion with compressive atelectasis, though infection is not excluded. Electronically Signed   By: Ileana Roup M.D.   On: 01/31/2021 09:41   CT CHEST ABDOMEN PELVIS WO CONTRAST  Result Date: 01/31/2021 CLINICAL DATA:  Chest and abdominal pain EXAM: CT CHEST, ABDOMEN AND PELVIS WITHOUT CONTRAST TECHNIQUE: Multidetector CT imaging of the chest, abdomen and pelvis was performed following the standard protocol without IV contrast. COMPARISON:  CT chest 09/20/2020 FINDINGS: CT CHEST FINDINGS Cardiovascular: Heart is enlarged. Small pericardial effusion. Extensive coronary artery calcifications. Mildly dilated pulmonary artery measuring 3.1 cm diameter. Thoracic aorta is tortuous and normal caliber with moderate calcified plaques. Mediastinum/Nodes: Previous right thyroidectomy changes. No bulky lymphadenopathy identified. Mildly enlarged right pretracheal lymph node measuring 11 mm. Lungs/Pleura: Trace bilateral pleural effusions right greater than left. No focal consolidation identified in the lungs. No  pneumothorax. Musculoskeletal: No chest wall mass or suspicious bone lesions identified. CT ABDOMEN PELVIS FINDINGS Hepatobiliary: Liver is upper normal size with no suspicious mass identified. Gallbladder is mildly distended and contains dependent calculi. 1.4 cm round density in the distal common bile duct consistent with choledocholithiasis. The common bile duct is dilated measuring 1.8 cm in diameter. Mild intrahepatic ductal dilatation. Fat stranding in the right upper quadrant. Pancreas: Mild pancreatic and peripancreatic inflammatory fat stranding changes centered at the pancreatic head. No mass or ductal dilatation appreciated. Spleen: Spleen is enlarged measuring 16 cm in length. Adrenals/Urinary Tract: Adrenal glands appear normal. Kidneys are atrophic with multiple cysts bilaterally measuring up to 4 cm on the left. No hydronephrosis visualized. Urinary bladder incompletely visualized with appearance of mild wall thickening. Stomach/Bowel: No bowel obstruction, free air or pneumatosis. Mild wall thickening of the duodenum, likely reactive from adjacent inflammatory changes. Colonic diverticulosis. Appendix is normal. Vascular/Lymphatic: Severe calcified atherosclerotic plaques. No bulky lymphadenopathy identified. Multiple small retroperitoneal and upper abdominal lymph nodes are noted which are nonspecific. Reproductive: Prostate gland not well visualized. Other: Trace ascites. Musculoskeletal: Degenerative changes of the spine. Bilateral hip prosthesis. No suspicious bony lesions identified. IMPRESSION: 1. Evidence of acute pancreatitis. 2. Choledocholithiasis including a 1.4 cm calculus in the distal common bile duct. Biliary ductal dilatation suggesting obstruction. Consider ERCP. 3. Cholelithiasis with pericholecystic edema which may represent acute cholecystitis or reactive changes. 4. Colonic diverticulosis. 5. Splenomegaly. 6. Cardiomegaly with small pericardial effusion. 7. Small pleural  effusions. 8. Coronary artery disease and atherosclerotic disease. Evidence of pulmonary artery hypertension. 9. Additional chronic findings as described. Electronically Signed   By: Ofilia Neas M.D.   On: 01/31/2021 13:58     Assessment and plan- Patient is a 75 y.o. male with history of hypertension hyperlipidemia and end-stage renal disease on dialysis admitted for GI bleed as well as choledocholithiasis.  Hematology consulted for anemia and thrombocytopenia  Thrombocytopenia: Patient has baseline thrombocytopenia with a platelet count in 100s.  He presented with  a platelet count of 62 on admission which has been gradually drifting down and down to 34 today.  Suspect this is secondary to his acute issues.  PT PTT INR elevated indicating a possible component of DIC.B12 and folate normal.  Immature platelet fraction was high that could indicate that the bone marrow is either in the recovering phase at some point versus an element of peripheral destruction.  Regardless I would not recommend any steroids at this time and would continue supportive care given concern for possible infection although clinically patient does not have signs of ascending cholangitis..  If the patient is not bleeding and does not require any invasive procedures okay to monitor his platelet count conservatively without the need for transfusion unless platelet counts are less than 15.  However the patient is portable for ERCP and I would recommend a unit of platelet transfusion just before the procedure and 1 unit during the procedure.  We will follow him as an outpatient and consider giving him Decadron with IVIG if his platelet count do not improve especially if he needs another ERCP down the line for possible sphincterotomy   Visit Diagnosis 1. Choledocholithiasis   2. Pain      Dr. Randa Evens, MD, MPH [facility] [phone] 02/03/2021 1:39 PM

## 2021-02-03 NOTE — Progress Notes (Signed)
Cephas Darby, MD [address]  [place]  [place], [place] [address]  Main: [phone]  Fax: [phone] Pager: [phone]   Subjective: No acute events overnight.  Patient is started on amiodarone drip.  No further episodes of hematochezia.  Patient is sleeping comfortably in bed, lethargic but arousable.  He denies any abdominal pain.  Objective: Vital signs in last 24 hours: Vitals:   02/03/21 1118 02/03/21 1133 02/03/21 1200 02/03/21 1245  BP: (!) 83/60 (!) 74/61 (!) 74/54 (!) 84/60  Pulse: (!) 108 74 92 (!) 118  Resp: 12 13 11 13   Temp: (!) 97.5 F (36.4 C) 97.7 F (36.5 C)  97.6 F (36.4 C)  TempSrc: Axillary Axillary  Axillary  SpO2: 95% 95% 95% 98%  Weight:      Height:       Weight change: 1.2 kg  Intake/Output Summary (Last 24 hours) at 02/03/2021 1341 Last data filed at 02/03/2021 1245 Gross per 24 hour  Intake 3349.37 ml  Output --  Net 3349.37 ml     Exam: Heart:: Tachycardia, irregular rhythm Lungs: clear to auscultation Abdomen: soft, nontender, normal bowel sounds   Lab Results: CBC    Component Value Date/Time   WBC 4.2 02/03/2021 0501   RBC 3.00 (L) 02/03/2021 0501   HGB 9.1 (L) 02/03/2021 0501   HGB 15.2 11/14/2012 2045   HCT 28.0 (L) 02/03/2021 0501   HCT 45.0 11/14/2012 2045   PLT 34 (L) 02/03/2021 0501   PLT 116 (L) 11/14/2012 2045   MCV 93.3 02/03/2021 0501   MCV 95 11/14/2012 2045   MCH 30.3 02/03/2021 0501   MCHC 32.5 02/03/2021 0501   RDW 17.2 (H) 02/03/2021 0501   RDW 15.2 (H) 11/14/2012 2045   LYMPHSABS 0.5 (L) 02/02/2021 1807   MONOABS 0.1 02/02/2021 1807   EOSABS 0.1 02/02/2021 1807   BASOSABS 0.0 02/02/2021 1807   CMP Latest Ref Rng & Units 02/03/2021 02/02/2021 02/02/2021  Glucose 70 - 99 mg/dL 89 85 87  BUN 8 - 23 mg/dL 54(H) 47(H) 42(H)  Creatinine 0.61 - 1.24 mg/dL 5.56(H) 5.06(H) 4.75(H)  Sodium 135 - 145 mmol/L 133(L) 136 135  Potassium 3.5 - 5.1 mmol/L 4.5 4.4 4.3  Chloride 98 - 111 mmol/L  98 100 98  CO2 22 - 32 mmol/L 25 25 26   Calcium 8.9 - 10.3 mg/dL 8.1(L) 8.7(L) 8.4(L)  Total Protein 6.5 - 8.1 g/dL - 5.5(L) 5.3(L)  Total Bilirubin 0.3 - 1.2 mg/dL - 3.8(H) 3.6(H)  Alkaline Phos 38 - 126 U/L - 457(H) 423(H)  AST 15 - 41 U/L - 45(H) 40  ALT 0 - 44 U/L - 38 32    Micro Results: Recent Results (from the past 240 hour(s))  Resp Panel by RT-PCR (Flu A&B, Covid) Nasopharyngeal Swab     Status: None   Collection Time: 01/31/21  9:11 AM   Specimen: Nasopharyngeal Swab; Nasopharyngeal(NP) swabs in vial transport medium  Result Value Ref Range Status   SARS Coronavirus 2 by RT PCR NEGATIVE NEGATIVE Final    Comment: (NOTE) SARS-CoV-2 target nucleic acids are NOT DETECTED.  The SARS-CoV-2 RNA is generally detectable in upper respiratory specimens during the acute phase of infection. The lowest concentration of SARS-CoV-2 viral copies this assay can detect is 138 copies/mL. A negative result does not preclude SARS-Cov-2 infection and should not be used as the sole basis for treatment or other patient management decisions. A negative result may occur with  improper specimen collection/handling, submission  of specimen other than nasopharyngeal swab, presence of viral mutation(s) within the areas targeted by this assay, and inadequate number of viral copies(<138 copies/mL). A negative result must be combined with clinical observations, patient history, and epidemiological information. The expected result is Negative.  Fact Sheet for Patients:  EntrepreneurPulse.com.au  Fact Sheet for Healthcare Providers:  IncredibleEmployment.be  This test is no t yet approved or cleared by the [place] FDA and  has been authorized for detection and/or diagnosis of SARS-CoV-2 by FDA under an Emergency Use Authorization (EUA). This EUA will remain  in effect (meaning this test can be used) for the duration of the COVID-19 declaration under Section  564(b)(1) of the Act, 21 U.S.C.section 360bbb-3(b)(1), unless the authorization is terminated  or revoked sooner.       Influenza A by PCR NEGATIVE NEGATIVE Final   Influenza B by PCR NEGATIVE NEGATIVE Final    Comment: (NOTE) The Xpert Xpress SARS-CoV-2/FLU/RSV plus assay is intended as an aid in the diagnosis of influenza from Nasopharyngeal swab specimens and should not be used as a sole basis for treatment. Nasal washings and aspirates are unacceptable for Xpert Xpress SARS-CoV-2/FLU/RSV testing.  Fact Sheet for Patients: EntrepreneurPulse.com.au  Fact Sheet for Healthcare Providers: IncredibleEmployment.be  This test is not yet approved or cleared by the [place] FDA and has been authorized for detection and/or diagnosis of SARS-CoV-2 by FDA under an Emergency Use Authorization (EUA). This EUA will remain in effect (meaning this test can be used) for the duration of the COVID-19 declaration under Section 564(b)(1) of the Act, 21 U.S.C. section 360bbb-3(b)(1), unless the authorization is terminated or revoked.  Performed at [facility], [address] [place], [place] [address]   MRSA Next Gen by PCR, Nasal     Status: None   Collection Time: 02/02/21  9:21 AM   Specimen: Nasal Mucosa; Nasal Swab  Result Value Ref Range Status   MRSA by PCR Next Gen NOT DETECTED NOT DETECTED Final    Comment: (NOTE) The GeneXpert MRSA Assay (FDA approved for NASAL specimens only), is one component of a comprehensive MRSA colonization surveillance program. It is not intended to diagnose MRSA infection nor to guide or monitor treatment for MRSA infections. Test performance is not FDA approved in patients less than 89 years old. Performed at [facility], [address] [place], [place] [address]   CULTURE, BLOOD (ROUTINE X 2) w Reflex to ID Panel     Status: None (Preliminary result)   Collection Time: 02/02/21  6:07 PM    Specimen: BLOOD  Result Value Ref Range Status   Specimen Description BLOOD BLOOD RIGHT HAND  Final   Special Requests   Final    BOTTLES DRAWN AEROBIC AND ANAEROBIC Blood Culture adequate volume   Culture   Final    NO GROWTH < 24 HOURS Performed at [facility], [address] [place], [place] [address]    Report Status PENDING  Incomplete  CULTURE, BLOOD (ROUTINE X 2) w Reflex to ID Panel     Status: None (Preliminary result)   Collection Time: 02/02/21  6:07 PM   Specimen: BLOOD  Result Value Ref Range Status   Specimen Description BLOOD BLOOD RIGHT FOREARM  Final   Special Requests   Final    BOTTLES DRAWN AEROBIC AND ANAEROBIC Blood Culture adequate volume   Culture   Final    NO GROWTH < 24 HOURS Performed at [facility], [place],  [place] [address]    Report Status PENDING  Incomplete   Studies/Results: DG Wrist Complete Right  Result Date: 02/02/2021 CLINICAL DATA:  Right wrist pain. EXAM: RIGHT WRIST - COMPLETE 3+ VIEW COMPARISON:  None. FINDINGS: There is no acute fracture or dislocation. Degenerative changes with narrowing of the radiocarpal distance and subcortical cystic changes of the carpal bones. Vascular calcifications noted. The soft tissues are grossly unremarkable. IMPRESSION: 1. No acute fracture or dislocation. 2. Degenerative changes. Electronically Signed   By: Anner Crete M.D.   On: 02/02/2021 19:12   Medications: I have reviewed the patient's current medications. Prior to Admission:  Medications Prior to Admission  Medication Sig Dispense Refill Last Dose   acetaminophen (TYLENOL) 325 MG tablet Take 2 tablets (650 mg total) by mouth every 6 (six) hours as needed for mild pain or moderate pain.   Unknown at PRN   apixaban (ELIQUIS) 5 MG TABS tablet Take 1 tablet (5 mg total) by mouth 2 (two) times daily. 60 tablet 0 01/30/2021 at 2015   atorvastatin (LIPITOR) 40 MG tablet Take 40 mg by mouth at bedtime.   01/30/2021  at 2015   Carboxymethylcellulose Sod PF 0.5 % SOLN Place 1 drop into both eyes 3 (three) times daily as needed (dry eyes).   Unknown at PRN   carvedilol (COREG) 3.125 MG tablet Take 1 tablet (3.125 mg total) by mouth 2 (two) times daily with a meal. 60 tablet 0 01/30/2021 at 1730   ciprofloxacin (CIPRO) 500 MG tablet Take 500 mg by mouth 2 (two) times daily.   01/30/2021 at 1730   clopidogrel (PLAVIX) 75 MG tablet Take 1 tablet (75 mg total) by mouth daily with breakfast. 30 tablet 0 01/30/2021 at 0800   clotrimazole (LOTRIMIN) 1 % cream Apply 1 application topically 2 (two) times daily. (Apply under foreskin of penis)   01/30/2021 at 2245   diclofenac Sodium (VOLTAREN) 1 % GEL Apply 2 g topically in the morning. (Apply to right stump)   01/30/2021 at 0845   [EXPIRED] doxycycline (VIBRAMYCIN) 100 MG capsule Take 100 mg by mouth 2 (two) times daily.   01/30/2021 at 1730   finasteride (PROSCAR) 5 MG tablet Take 5 mg by mouth daily.   01/30/2021 at 0800   HYDROcodone-acetaminophen (NORCO/VICODIN) 5-325 MG tablet Take 1 tablet by mouth every 6 (six) hours as needed for severe pain. 10 tablet 0 Unknown at PRN   lactulose, encephalopathy, (CHRONULAC) 10 GM/15ML SOLN Take 10 g by mouth 2 (two) times daily as needed (mild constipation).   Unknown` at PRN   loperamide (IMODIUM A-D) 2 MG tablet Take 2 tablets (4 mg total) by mouth 4 (four) times daily as needed for diarrhea or loose stools. 30 tablet 0 Unknown at PRN   loratadine (CLARITIN) 10 MG tablet Take 10 mg by mouth See admin instructions. Take 10 mg by mouth daily on Monday, Wednesday, Friday and Sunday   01/29/2021 at 1045   midodrine (PROAMATINE) 10 MG tablet Take 10 mg by mouth Every Tuesday,Thursday,and Saturday with dialysis.   01/28/2021 at 1030   midodrine (PROAMATINE) 5 MG tablet Take 5 mg by mouth daily at 6 (six) AM.   01/30/2021 at 0615   pregabalin (LYRICA) 75 MG capsule Take 75 mg by mouth 2 (two) times daily.   01/30/2021 at 2015    rOPINIRole (REQUIP) 1 MG tablet Take 1 mg by mouth at bedtime.   01/30/2021 at 2015   sevelamer carbonate (RENVELA) 800 MG tablet Take 1,600 mg  by mouth See admin instructions. Take 2 tablets (1600mg ) by mouth three times daily with meals on Monday, Wednesday, Friday and "Sunday   01/29/2021 at 1045   sevelamer carbonate (RENVELA) 800 MG tablet Take 800 mg by mouth See admin instructions. Take 2 tablets (1600mg) by mouth twice daily with meals (breakfast and dinner) on Tuesday, Thursday and Saturday   01/30/2021 at 1730   sodium hypochlorite (DAKIN'S 1/4 STRENGTH) 0.125 % SOLN Apply 1 application topically See admin instructions. Use as directed after cleansing left stump wound - use twice daily and as needed   01/30/2021 at 2245   vitamin B-12 (CYANOCOBALAMIN) 1000 MCG tablet Take 1,000 mcg by mouth daily.      mupirocin ointment (BACTROBAN) 2 % Place 1 application into the nose 2 (two) times daily. (Patient not taking: Reported on 01/31/2021) 22 g 0 Not Taking   Scheduled:  sodium chloride   Intravenous Once   aspirin EC  81 mg Oral Daily   atorvastatin  40 mg Oral QHS   chlorhexidine  15 mL Mouth Rinse BID   Chlorhexidine Gluconate Cloth  6 each Topical Q0600   clotrimazole  1 application Topical BID   diclofenac Sodium  2 g Topical q AM   [START ON 02/04/2021] epoetin (EPOGEN/PROCRIT) injection  10,000 Units Intravenous Q T,Th,Sa-HD   finasteride  5 mg Oral Daily   loratadine  10 mg Oral Once per day on Sun Mon Wed Fri   mouth rinse  15 mL Mouth Rinse q12n4p   midodrine  10 mg Oral Q T,Th,Sa-HD   midodrine  5 mg Oral Q0600   pantoprazole (PROTONIX) IV  40 mg Intravenous Q12H   pregabalin  75 mg Oral BID   rOPINIRole  1 mg Oral QHS   sevelamer carbonate  1,600 mg Oral 2 times per day on Tue Thu Sat   sevelamer carbonate  1,600 mg Oral 3 times per day on Sun Mon Wed Fri   vitamin B-12  1,000 mcg Oral Daily   Continuous:  sodium chloride 100 mL/hr at 02/03/21 1139   sodium chloride      sodium chloride     amiodarone 30 mg/hr (02/03/21 1303)   piperacillin-tazobactam (ZOSYN)  IV 2.25 g (02/03/21 1302)   PRN:sodium chloride, sodium chloride, acetaminophen, alteplase, heparin, HYDROcodone-acetaminophen, lactulose, lidocaine (PF), lidocaine-prilocaine, loperamide, morphine injection, pentafluoroprop-tetrafluoroeth, polyvinyl alcohol, sodium hypochlorite Anti-infectives (From admission, onward)    Start     Dose/Rate Route Frequency Ordered Stop   02/02/21 2200  ceFEPIme (MAXIPIME) 1 g in sodium chloride 0.9 % 100 mL IVPB  Status:  Discontinued        1 g 200 mL/hr over 30 Minutes Intravenous Every 12 hours 02/02/21 1541 02/02/21 1643   02/02/21 1645  piperacillin-tazobactam (ZOSYN) IVPB 2.25 g        2.25 g 100 mL/hr over 30 Minutes Intravenous Every 8 hours 02/02/21 1549     11" /28/22 0900  piperacillin-tazobactam (ZOSYN) IVPB 2.25 g  Status:  Discontinued        2.25 g 100 mL/hr over 30 Minutes Intravenous Every 8 hours 02/01/21 0854 02/01/21 1013      Scheduled Meds:  sodium chloride   Intravenous Once   aspirin EC  81 mg Oral Daily   atorvastatin  40 mg Oral QHS   chlorhexidine  15 mL Mouth Rinse BID   Chlorhexidine Gluconate Cloth  6 each Topical Q0600   clotrimazole  1 application Topical BID   diclofenac Sodium  2 g  Topical q AM   [START ON 02/04/2021] epoetin (EPOGEN/PROCRIT) injection  10,000 Units Intravenous Q T,Th,Sa-HD   finasteride  5 mg Oral Daily   loratadine  10 mg Oral Once per day on Sun Mon Wed Fri   mouth rinse  15 mL Mouth Rinse q12n4p   midodrine  10 mg Oral Q T,Th,Sa-HD   midodrine  5 mg Oral Q0600   pantoprazole (PROTONIX) IV  40 mg Intravenous Q12H   pregabalin  75 mg Oral BID   rOPINIRole  1 mg Oral QHS   sevelamer carbonate  1,600 mg Oral 2 times per day on Tue Thu Sat   sevelamer carbonate  1,600 mg Oral 3 times per day on Sun Mon Wed Fri   vitamin B-12  1,000 mcg Oral Daily   Continuous Infusions:  sodium chloride 100 mL/hr at  02/03/21 1139   sodium chloride     sodium chloride     amiodarone 30 mg/hr (02/03/21 1303)   piperacillin-tazobactam (ZOSYN)  IV 2.25 g (02/03/21 1302)   PRN Meds:.sodium chloride, sodium chloride, acetaminophen, alteplase, heparin, HYDROcodone-acetaminophen, lactulose, lidocaine (PF), lidocaine-prilocaine, loperamide, morphine injection, pentafluoroprop-tetrafluoroeth, polyvinyl alcohol, sodium hypochlorite   Assessment: Principal Problem:   Abdominal pain Active Problems:   ESRD (end stage renal disease) (HCC)   Atrial fibrillation, chronic (HCC)   Wound of skin   Choledocholithiasis   Morbid obesity (HCC)   Normocytic anemia   Atrial fibrillation with rapid ventricular response ([place])  Antonio Phillips is a 75 y.o. male with history of peripheral artery disease s/p bilateral BKA, A. fib on Eliquis, CAD s/p PCI with DES in 09/2020 on DAPT, ESRD on hemodialysis is admitted with acute gallstone pancreatitis and choledocholithiasis, hospital course complicated by worsening anemia, hematochezia, new onset of A. fib with RVR, transferred to stepdown  Plan: Acute gallstone pancreatitis with choledocholithiasis Patient denies any abdominal pain, nausea or vomiting, LFTs have slightly worsened since yesterday ?  Ascending cholangitis Continue empiric Zosyn Monitor LFTs daily Blood cultures in process Patient is maintaining his MAP in 60s on amiodarone drip.  Patient is also on midodrine Continue clear liquid diet Timing of ERCP based on patient's hemodynamics, tentative plan to perform tomorrow, will discuss with Dr. Allen Norris  Hematochezia x 1 episode Likely ischemic colitis or hemorrhoidal bleed or diverticular bleed Less likely an upper GI bleed.  No evidence of cirrhosis Monitor closely for signs of recurrent bleeding Monitor CBC closely and transfuse to maintain hemoglobin above 8 and platelets above 50 in setting of significant coronary artery disease If patient has episodes of  recurrent bleeding, he will need endoscopic evaluation Recommend Protonix 40 mg IV twice daily Eliquis and Plavix have been held Okay to continue aspirin 81 mg daily  A. fib with RVR On amiodarone drip, rate controlled Appreciate cardiology input  With multiple comorbidities, patient is critically ill GI will follow along with you   LOS: 3 days   Riggin Cuttino 02/03/2021, 1:41 PM

## 2021-02-04 ENCOUNTER — Inpatient Hospital Stay: Payer: Medicare Other

## 2021-02-04 ENCOUNTER — Encounter
Admission: EM | Disposition: A | Discharge status: Skilled Nursing Facility | Payer: Self-pay | Source: Skilled Nursing Facility | Attending: Internal Medicine

## 2021-02-04 ENCOUNTER — Other Ambulatory Visit: Payer: Self-pay

## 2021-02-04 ENCOUNTER — Encounter: Payer: Self-pay | Admitting: Family Medicine

## 2021-02-04 ENCOUNTER — Inpatient Hospital Stay: Payer: Medicare Other | Admitting: Anesthesiology

## 2021-02-04 DIAGNOSIS — K8050 Calculus of bile duct without cholangitis or cholecystitis without obstruction: Secondary | ICD-10-CM | POA: Diagnosis not present

## 2021-02-04 DIAGNOSIS — I4891 Unspecified atrial fibrillation: Secondary | ICD-10-CM | POA: Diagnosis not present

## 2021-02-04 DIAGNOSIS — R1011 Right upper quadrant pain: Secondary | ICD-10-CM | POA: Diagnosis not present

## 2021-02-04 DIAGNOSIS — N186 End stage renal disease: Secondary | ICD-10-CM | POA: Diagnosis not present

## 2021-02-04 DIAGNOSIS — I255 Ischemic cardiomyopathy: Secondary | ICD-10-CM

## 2021-02-04 DIAGNOSIS — I25118 Atherosclerotic heart disease of native coronary artery with other forms of angina pectoris: Secondary | ICD-10-CM

## 2021-02-04 HISTORY — PX: ERCP: SHX5425

## 2021-02-04 LAB — BPAM PLATELET PHERESIS
Blood Product Expiration Date: 202211302359
Blood Product Expiration Date: 202212012359
ISSUE DATE / TIME: 202211300941
ISSUE DATE / TIME: 202211301114
Unit Type and Rh: 5100
Unit Type and Rh: 5100

## 2021-02-04 LAB — PREPARE PLATELET PHERESIS
Unit division: 0
Unit division: 0

## 2021-02-04 LAB — CBC
HCT: 28 % — ABNORMAL LOW (ref 39.0–52.0)
Hemoglobin: 9.1 g/dL — ABNORMAL LOW (ref 13.0–17.0)
MCH: 30.6 pg (ref 26.0–34.0)
MCHC: 32.5 g/dL (ref 30.0–36.0)
MCV: 94.3 fL (ref 80.0–100.0)
Platelets: 41 10*3/uL — ABNORMAL LOW (ref 150–400)
RBC: 2.97 MIL/uL — ABNORMAL LOW (ref 4.22–5.81)
RDW: 17.2 % — ABNORMAL HIGH (ref 11.5–15.5)
WBC: 4 10*3/uL (ref 4.0–10.5)
nRBC: 0 % (ref 0.0–0.2)

## 2021-02-04 LAB — PROCALCITONIN: Procalcitonin: 9.28 ng/mL

## 2021-02-04 LAB — GLUCOSE, CAPILLARY
Glucose-Capillary: 104 mg/dL — ABNORMAL HIGH (ref 70–99)
Glucose-Capillary: 94 mg/dL (ref 70–99)
Glucose-Capillary: 106 mg/dL — ABNORMAL HIGH (ref 70–99)
Glucose-Capillary: 86 mg/dL (ref 70–99)
Glucose-Capillary: 110 mg/dL — ABNORMAL HIGH (ref 70–99)
Glucose-Capillary: 107 mg/dL — ABNORMAL HIGH (ref 70–99)

## 2021-02-04 SURGERY — ERCP, WITH INTERVENTION IF INDICATED
Anesthesia: General

## 2021-02-04 MED ORDER — ETOMIDATE 2 MG/ML IV SOLN
INTRAVENOUS | Status: DC | PRN
  Administered 2021-02-04: 18 mg via INTRAVENOUS

## 2021-02-04 MED ORDER — SODIUM CHLORIDE 0.9% IV SOLUTION: Freq: Once | INTRAVENOUS | Status: AC

## 2021-02-04 MED ORDER — LIDOCAINE HCL (PF) 2 % IJ SOLN
INTRAMUSCULAR | Status: AC
  Filled 2021-02-04: qty 5

## 2021-02-04 MED ORDER — MIDODRINE HCL 5 MG PO TABS
5.0000 mg | ORAL_TABLET | Freq: Once | ORAL | Status: AC
Start: 2021-02-04 — End: 2021-02-04
  Administered 2021-02-04: 5 mg via ORAL
  Filled 2021-02-04: qty 1

## 2021-02-04 MED ORDER — SUCCINYLCHOLINE CHLORIDE 200 MG/10ML IV SOSY
PREFILLED_SYRINGE | INTRAVENOUS | Status: DC | PRN
  Administered 2021-02-04: 128 mg via INTRAVENOUS

## 2021-02-04 MED ORDER — FENTANYL CITRATE (PF) 100 MCG/2ML IJ SOLN
INTRAMUSCULAR | Status: DC | PRN
  Administered 2021-02-04: 25 ug via INTRAVENOUS

## 2021-02-04 MED ORDER — PHENYLEPHRINE HCL-NACL 20-0.9 MG/250ML-% IV SOLN
INTRAVENOUS | Status: DC | PRN
  Administered 2021-02-04: 56 ug/min via INTRAVENOUS

## 2021-02-04 MED ORDER — VASOPRESSIN 20 UNIT/ML IV SOLN
INTRAVENOUS | Status: DC | PRN
  Administered 2021-02-04: 2 [IU] via INTRAVENOUS
  Administered 2021-02-04 (×2): 1 [IU] via INTRAVENOUS
  Administered 2021-02-04: 2 [IU] via INTRAVENOUS
  Administered 2021-02-04: 1 [IU] via INTRAVENOUS

## 2021-02-04 MED ORDER — PROPOFOL 10 MG/ML IV BOLUS
INTRAVENOUS | Status: AC
  Filled 2021-02-04: qty 20

## 2021-02-04 MED ORDER — FENTANYL CITRATE (PF) 100 MCG/2ML IJ SOLN
INTRAMUSCULAR | Status: AC
  Filled 2021-02-04: qty 2

## 2021-02-04 MED ORDER — LACTATED RINGERS IV SOLN: INTRAVENOUS | Status: DC

## 2021-02-04 MED ORDER — PHENYLEPHRINE HCL (PRESSORS) 10 MG/ML IV SOLN
INTRAVENOUS | Status: DC | PRN
  Administered 2021-02-04 (×2): 160 ug via INTRAVENOUS

## 2021-02-04 MED ORDER — PHENYLEPHRINE HCL-NACL 20-0.9 MG/250ML-% IV SOLN
INTRAVENOUS | Status: AC
  Filled 2021-02-04: qty 250

## 2021-02-04 MED ORDER — LIDOCAINE HCL (CARDIAC) PF 100 MG/5ML IV SOSY
PREFILLED_SYRINGE | INTRAVENOUS | Status: DC | PRN
Start: 2021-02-04 — End: 2021-02-04
  Administered 2021-02-04: 60 mg via INTRAVENOUS

## 2021-02-04 MED ORDER — SODIUM CHLORIDE 0.9 % IV SOLN: INTRAVENOUS | Status: DC

## 2021-02-04 MED ORDER — MIDODRINE HCL 5 MG PO TABS
10.0000 mg | ORAL_TABLET | Freq: Every day | ORAL | Status: DC
  Administered 2021-02-05: 10 mg via ORAL
  Filled 2021-02-04: qty 2

## 2021-02-04 NOTE — Progress Notes (Signed)
After reviewing the patient's notes in epic, labs, and imaging, I assessed the patient at bedside.  He had just returned from ERCP.  He is lethargic and still feeling after effects of sedation.  Not appropriate to have goals of care discussion or advance care planning decisions at this time.  I will continue to monitor the patient throughout his hospitalization and visit with him tomorrow.  Kingsley Ilsa Iha, FNP-BC Palliative Medicine Team Team Phone # [phone]   NO CHARGE

## 2021-02-04 NOTE — Transfer of Care (Signed)
Immediate Anesthesia Transfer of Care Note  Patient: Antonio Phillips  Procedure(s) Performed: ENDOSCOPIC RETROGRADE CHOLANGIOPANCREATOGRAPHY (ERCP)  Patient Location: PACU  Anesthesia Type:General  Level of Consciousness: awake, drowsy and patient cooperative  Airway & Oxygen Therapy: Patient Spontanous Breathing and Patient connected to nasal cannula oxygen  Post-op Assessment: Report given to RN and Post -op Vital signs reviewed and stable  Post vital signs: Reviewed  Last Vitals:  Vitals Value Taken Time  BP 90/66 02/04/21 1153  Temp    Pulse 98   Resp 20 02/04/21 1200  SpO2 95   Vitals shown include unvalidated device data.  Last Pain:  Vitals:   02/04/21 1009  TempSrc: Temporal  PainSc: 0-No pain         Complications: No notable events documented.

## 2021-02-04 NOTE — Anesthesia Postprocedure Evaluation (Signed)
Anesthesia Post Note  Patient: Antonio Phillips  Procedure(s) Performed: ENDOSCOPIC RETROGRADE CHOLANGIOPANCREATOGRAPHY (ERCP)  Patient location during evaluation: PACU Anesthesia Type: General Level of consciousness: awake and alert, oriented and patient cooperative Pain management: pain level controlled Vital Signs Assessment: post-procedure vital signs reviewed and stable Respiratory status: spontaneous breathing, nonlabored ventilation and respiratory function stable Cardiovascular status: blood pressure returned to baseline and stable Postop Assessment: adequate PO intake Anesthetic complications: no   No notable events documented.   Last Vitals:  Vitals:   02/04/21 1215 02/04/21 1300  BP: (!) 85/67 90/67  Pulse: 95   Resp: 14 11  Temp: (!) 36.1 C   SpO2: 96%     Last Pain:  Vitals:   02/04/21 1215  TempSrc:   PainSc: 0-No pain                 Darrin Nipper

## 2021-02-04 NOTE — Op Note (Signed)
[facility] Patient Name: Antonio Phillips Procedure Date: 02/04/2021 10:25 AM MRN: [id number] Account #: [id number] Date of Birth: 08/25/1945 Admit Type: Inpatient Age: 75 Room: 4 Gender: Male Note Status: Finalized Instrument Name: [id number] Procedure:             ERCP Indications:           Common bile duct stone(s) Providers:             Lucilla Lame MD, MD Referring MD:          Minna Merritts, MD (Referring MD) Medicines:             General Anesthesia Complications:         No immediate complications. Procedure:             Pre-Anesthesia Assessment:                        - Prior to the procedure, a History and Physical was                         performed, and patient medications and allergies were                         reviewed. The patient's tolerance of previous                         anesthesia was also reviewed. The risks and benefits                         of the procedure and the sedation options and risks                         were discussed with the patient. All questions were                         answered, and informed consent was obtained. Prior                         Anticoagulants: The patient has taken Plavix                         (clopidogrel) and Eliquis, last dose was 4 days prior                         to procedure. ASA Grade Assessment: III - A patient                         with severe systemic disease. After reviewing the                         risks and benefits, the patient was deemed in                         satisfactory condition to undergo the procedure.                        After obtaining informed consent, the scope was passed  under direct vision. Throughout the procedure, the                         patient's blood pressure, pulse, and oxygen                         saturations were monitored continuously. The                         Duodenoscope was  introduced through the mouth, and                         used to inject contrast into and used to inject                         contrast into the bile duct. Findings:      The scout film was normal. The major papilla was adjacent to a       diverticulum. The bile duct was deeply cannulated with the short-nosed       traction sphincterotome. Contrast was injected. I personally interpreted       the bile duct images. There was brisk flow of contrast through the       ducts. Image quality was excellent. Contrast extended to the entire       biliary tree. The main bile duct was severely dilated. The lower third       of the main bile duct contained one stone, which was 14 mm in diameter.       A wire was passed into the biliary tree. One 10 Fr by 7 cm plastic stent       with a single external flap and a single internal flap was placed 5 cm       into the common bile duct. Bile flowed through the stent. The stent was       in good position. Impression:            - The major papilla was adjacent to a diverticulum.                        - The entire main bile duct was severely dilated.                        - Choledocholithiasis was found. Removal was not                         attempted; a stent was inserted.                        - One plastic stent was placed into the common bile                         duct. Recommendation:        - Return patient to ICU for ongoing care.                        - Clear liquid diet.                        - Watch for pancreatitis, bleeding, perforation, and  cholangitis.                        - Repeat ERCP in 1 month to remove stent off                         anticoagulation Procedure Code(s):     --- Professional ---                        [phone], Endoscopic retrograde cholangiopancreatography                         (ERCP); with placement of endoscopic stent into                         biliary or pancreatic duct, including  pre- and                         post-dilation and guide wire passage, when performed,                         including sphincterotomy, when performed, each stent                        [id number], Endoscopic catheterization of the biliary                         ductal system, radiological supervision and                         interpretation Diagnosis Code(s):     --- Professional ---                        K80.50, Calculus of bile duct without cholangitis or                         cholecystitis without obstruction CPT copyright 2019 [employer]. All rights reserved. The codes documented in this report are preliminary and upon coder review may  be revised to meet current compliance requirements. Lucilla Lame MD, MD 02/04/2021 11:39:06 AM This report has been signed electronically. Number of Addenda: 0 Note Initiated On: 02/04/2021 10:25 AM Estimated Blood Loss:  Estimated blood loss: none.      [facility]

## 2021-02-04 NOTE — Anesthesia Procedure Notes (Signed)
Procedure Name: Intubation Date/Time: 02/04/2021 11:04 AM Performed by: Jerrye Noble, CRNA Pre-anesthesia Checklist: Patient identified, Emergency Drugs available, Suction available and Patient being monitored Patient Re-evaluated:Patient Re-evaluated prior to induction Oxygen Delivery Method: Circle system utilized Preoxygenation: Pre-oxygenation with 100% oxygen Induction Type: IV induction Ventilation: Mask ventilation without difficulty Laryngoscope Size: McGraph and 4 Grade View: Grade I Tube type: Oral Tube size: 7.5 mm Number of attempts: 1 Airway Equipment and Method: Stylet, Oral airway and Video-laryngoscopy Placement Confirmation: ETT inserted through vocal cords under direct vision, positive ETCO2 and breath sounds checked- equal and bilateral Tube secured with: Tape Dental Injury: Teeth and Oropharynx as per pre-operative assessment

## 2021-02-04 NOTE — Progress Notes (Signed)
Progress Note  Patient Name: Antonio Phillips Date of Encounter: 02/04/2021  Primary Cardiologist: Ida Rogue, MD  Subjective   Feels well this AM.  Denies abd pain.  No c/p or dyspnea.  SBPs 90's this AM after trending lower overnight.  HRs 90's to 1-teens on IV amio.  For ERCP today.  Inpatient Medications    Scheduled Meds:  sodium chloride   Intravenous Once   aspirin EC  81 mg Oral Daily   atorvastatin  40 mg Oral QHS   chlorhexidine  15 mL Mouth Rinse BID   Chlorhexidine Gluconate Cloth  6 each Topical Q0600   clotrimazole  1 application Topical BID   diclofenac Sodium  2 g Topical q AM   epoetin (EPOGEN/PROCRIT) injection  10,000 Units Intravenous Q T,Th,Sa-HD   finasteride  5 mg Oral Daily   loratadine  10 mg Oral Once per day on Sun Mon Wed Fri   mouth rinse  15 mL Mouth Rinse q12n4p   midodrine  10 mg Oral Q T,Th,Sa-HD   midodrine  5 mg Oral Q0600   pantoprazole (PROTONIX) IV  40 mg Intravenous Q12H   pregabalin  75 mg Oral BID   rOPINIRole  1 mg Oral QHS   sevelamer carbonate  1,600 mg Oral 2 times per day on Tue Thu Sat   sevelamer carbonate  1,600 mg Oral 3 times per day on Sun Mon Wed Fri   vitamin B-12  1,000 mcg Oral Daily   Continuous Infusions:  sodium chloride 100 mL/hr at 02/04/21 1300   sodium chloride     sodium chloride     amiodarone 30 mg/hr (02/04/21 1310)   piperacillin-tazobactam (ZOSYN)  IV Stopped (02/04/21 0557)   PRN Meds: sodium chloride, sodium chloride, acetaminophen, alteplase, heparin, HYDROcodone-acetaminophen, lactulose, lidocaine (PF), lidocaine-prilocaine, loperamide, morphine injection, pentafluoroprop-tetrafluoroeth, polyvinyl alcohol   Vital Signs    Vitals:   02/04/21 1153 02/04/21 1200 02/04/21 1215 02/04/21 1300  BP: 90/66 92/70 (!) 85/67 90/67  Pulse: (!) 104  95   Resp: 17 20 14 11   Temp: (!) 96.6 F (35.9 C)  (!) 97 F (36.1 C)   TempSrc:      SpO2: 97%  96%   Weight:      Height:         Intake/Output Summary (Last 24 hours) at 02/04/2021 1347 Last data filed at 02/04/2021 1300 Gross per 24 hour  Intake 2744.87 ml  Output 0 ml  Net 2744.87 ml   Filed Weights   02/01/21 2052 02/02/21 1555 02/04/21 1009  Weight: 218 lb 4.1 oz (99 kg) 220 lb 14.4 oz (100.2 kg) 220 lb 14.4 oz (100.2 kg)    Physical Exam   GEN: OBese, in no acute distress.  HEENT: Grossly normal.  Neck: Supple, no JVD, carotid bruits, or masses. Cardiac: IR, IR, tachy, distant, no murmurs, rubs, or gallops. No clubbing, cyanosis, edema.  Bilat BKAs. Respiratory:  Respirations regular and unlabored, clear to auscultation bilaterally. GI: Obese, protuberant, semi-firm, nontender,BS + x 4. MS: no deformity or atrophy. Skin: warm and dry, no rash. Neuro:  Strength and sensation are intact. Psych: AAOx3.  Normal affect.  Labs    Chemistry Recent Labs  Lab 02/02/21 0647 02/02/21 1807 02/03/21 0501 02/03/21 1302  NA 135 136 133*  --   K 4.3 4.4 4.5  --   CL 98 100 98  --   CO2 26 25 25   --   GLUCOSE 87 85 89  --  BUN 42* 47* 54*  --   CREATININE 4.75* 5.06* 5.56*  --   CALCIUM 8.4* 8.7* 8.1*  --   PROT 5.3* 5.5*  --  5.5*  ALBUMIN 2.8* 2.9*  --  2.7*  AST 40 45*  --  34  ALT 32 38  --  30  ALKPHOS 423* 457*  --  364*  BILITOT 3.6* 3.8*  --  2.6*  GFRNONAA 12* 11* 10*  --   ANIONGAP 11 11 10   --      Hematology Recent Labs  Lab 02/03/21 0501 02/03/21 1302 02/04/21 0415  WBC 4.2 6.0 4.0  RBC 3.00* 3.19* 2.97*  HGB 9.1* 9.6* 9.1*  HCT 28.0* 30.2* 28.0*  MCV 93.3 94.7 94.3  MCH 30.3 30.1 30.6  MCHC 32.5 31.8 32.5  RDW 17.2* 17.2* 17.2*  PLT 34* 57* 41*    Cardiac Enzymes  Recent Labs  Lab 01/31/21 0909 01/31/21 1338 02/02/21 1807 02/02/21 2135  TROPONINIHS 95* 101* 356* 397*      BNP Recent Labs  Lab 01/31/21 0909  BNP 1,051.9*    Lipids  Lab Results  Component Value Date   CHOL 58 09/23/2020   HDL 23 (L) 09/23/2020   LDLCALC 22 09/23/2020   TRIG 63  09/23/2020   CHOLHDL 2.5 09/23/2020    HbA1c  Lab Results  Component Value Date   HGBA1C 5.5 11/10/2018    Radiology    DG Wrist Complete Right  Result Date: 02/02/2021 CLINICAL DATA:  Right wrist pain. EXAM: RIGHT WRIST - COMPLETE 3+ VIEW COMPARISON:  None. FINDINGS: There is no acute fracture or dislocation. Degenerative changes with narrowing of the radiocarpal distance and subcortical cystic changes of the carpal bones. Vascular calcifications noted. The soft tissues are grossly unremarkable. IMPRESSION: 1. No acute fracture or dislocation. 2. Degenerative changes. Electronically Signed   By: Anner Crete M.D.   On: 02/02/2021 19:12   DG C-Arm 1-60 Min-No Report  Result Date: 02/04/2021 Fluoroscopy was utilized by the requesting physician.  No radiographic interpretation.    Telemetry    Afib 90's to 1-teens - Personally Reviewed  Cardiac Studies   Limited 2D echo with Definity 09/28/2020: 1. Left ventricular ejection fraction, by estimation, is 45 to 50%. The  left ventricle has mildly decreased function. Left ventricular endocardial  border not optimally defined to evaluate regional wall motion. There is  mild left ventricular hypertrophy.   2. Right ventricular systolic function is moderately reduced. The right  ventricular size is normal. Mildly increased right ventricular wall  thickness. There is moderately elevated pulmonary artery systolic  pressure.   3. The mitral valve is abnormal. Trivial mitral valve regurgitation.   4. The aortic valve is tricuspid. There is mild thickening of the aortic  valve.   5. The inferior vena cava is dilated in size with <50% respiratory  variability, suggesting right atrial pressure of 15 mmHg.  __________   2D echo 09/27/2020:  1. Poor acoustic windows Endocardium is not well seen I would recomm  limited echo with Definity to help define LVEF and regional wall motion. .  There is mild left ventricular hypertrophy. Left  ventricular diastolic  parameters are indeterminate.   2. RV is not seen well enough to evaluate function. . The right  ventricular size is mildly enlarged.   3. Left atrial size was severely dilated.   4. Right atrial size was severely dilated.   5. Mild mitral valve regurgitation.   6. Tricuspid valve regurgitation  is mild to moderate.   7. The aortic valve is tricuspid. Aortic valve regurgitation is not  visualized. Mild aortic valve sclerosis is present, with no evidence of  aortic valve stenosis.  __________   LHC 09/22/2020:   Ost LAD to Prox LAD lesion is 50% stenosed.   2nd Mrg lesion is 99% stenosed.   A drug-eluting stent was successfully placed using a STENT RESOLUTE ONYX 2.5X15.   Post intervention, there is a 30% residual stenosis.   1.  Severe single-vessel coronary artery disease with 99% subtotal stenosis of the second obtuse marginal branch, treated with a 2.5 x 15 mm resolute Onyx DES.  30% residual stenosis at the case completion due to resistant/calcified lesion type even with high-pressure noncompliant balloon angioplasty. 2.  Mild to moderate nonobstructive proximal LAD stenosis 3.  Mild nonobstructive RCA stenosis  Recommendations: Aggressive medical therapy, as long as no bleeding complications arise, would resume apixaban tomorrow, aspirin 81 mg daily x30 days, and clopidogrel 75 mg daily x minimum 6 months. ___________   2D echo 09/21/2020: 1. Left ventricular ejection fraction, by estimation, is 60 to 65%. The  left ventricle has normal function. The left ventricle has no regional  wall motion abnormalities. Left ventricular diastolic parameters are  indeterminate.   2. Right ventricular systolic function is normal. The right ventricular  size is normal. Tricuspid regurgitation signal is inadequate for assessing  PA pressure.   3. Left atrial size was moderately dilated.   4. Right atrial size was moderately dilated.   5. Tricuspid valve regurgitation is  mild to moderate.   6. The mitral valve is normal in structure. Mild mitral valve  regurgitation.   7. Rhythym is atrial fibrillation   Patient Profile     75 y.o. male with a hx of CAD s/p DES  OM2 in 09/2020, PAD s/p L BKA and R BKA, permanent Afib on Eliquis, ESRD on HD, hypotension, morbid obesity, diabetes, and demand ischemia who was admitted 11/29 w/ abd pain and gallstone pancreatitis w/ plan for ERCP.  Assessment & Plan    1.  Gallstone pancreatitis: For ERCP today.  Eliquis and clopidogrel currently on hold with plan to resume postprocedure.  Currently on aspirin, which can be discontinued once Eliquis and clopidogrel resumed.  Further management per GI.  2.  Coronary artery disease/demand ischemia: Status post drug-eluting stent placement to the OM 2 in July 2022.  He has not been having any chest pain.  He has moderate troponin elevation to a peak of 397 in the setting of gallstone pancreatitis/sepsis/hypotension and end-stage renal disease with elevated creatinine.  Suspect demand ischemia.  Continue aspirin and statin as Plavix currently on hold.  In the setting of hypotension, carvedilol is on hold and midodrine has been added.  3.  Hypotension: Off of norepinephrine and on midodrine.  Pressure soft overnight and slightly better this morning.  Will increase midodrine dose to 10 mg daily.  4.  Permanent atrial fibrillation: Previously on beta-blocker but this is on hold.  Rates been elevated in the setting of acute illness and he is currently on IV amiodarone for rate control only.  Eliquis on hold pending ERCP.  5.  End-stage renal disease: Dialysis per nephrology.  6.  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  Beta-blocker on hold in the setting of hypotension requiring midodrine.  Volume management per nephrology.  7.  Normocytic anemia: Stable.  Signed, Murray Hodgkins,  NP  02/04/2021, 1:47 PM    For questions or  updates, please contact   Please consult [email or web address] for contact info under Cardiology/STEMI.

## 2021-02-04 NOTE — Progress Notes (Signed)
PROGRESS NOTE    Antonio Phillips  EHU:[id number] DOB: 07/04/1945 DOA: 01/31/2021 PCP: Marsh Dolly, MD   Brief Narrative: Taken from prior notes. [name], 75 y.o. 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 fib on Eliquis, HLD, chronic hypotension on midodrine, morbid obesity who presented from nursing home with complaints of abdominal pain and emesis of 2-3 days duration.    Seen in the ED,cxr pulmonary edema,at least moderate pleural effusion with compressive atelectasis, CT chest abdomen pelvis showed acute pancreatitis, choledocholithiasis 1.4 cm calculus in the distal CBD with biliary duct dilatation suggesting obstruction .Clinically no concerns for acute cholangitis however.  ERCP was advised by Gastroenterology.  Initially canceled due to persistent hypotension, received 2 units of PRBC for hematochezia.  Holding Eliquis and Plavix. Transferred to stepdown due to worsening hypotension, map goal of 55.  Patient underwent successful ERCP with GI, found to have dilated bile duct with 1 stone, removal was not attempted but a stent was placed which needs to be removed in 1 month with another ERCP.  Patient is high risk for deterioration and death based on multiple life limiting comorbidities.  Palliative care was also consulted but patient would like to remain full code.  Subjective: Patient was seen after the ERCP.  Having some right upper quadrant discomfort and appears lethargic with the sedation. Some nursing concern of 1 dark color bowel movement.  Assessment & Plan:   Principal Problem:   Abdominal pain Active Problems:   ESRD (end stage renal disease) (HCC)   Atrial fibrillation, chronic (HCC)   Wound of skin   Choledocholithiasis   Morbid obesity (HCC)   Normocytic anemia   Atrial fibrillation with rapid ventricular response (HCC)  Acute on chronic anemia.  Most likely some GI loss.  Underwent ERCP  with stent placement in common bile duct with GI.  Received 2 unit of PRBC.  PPI was initiated.  Hemoglobin improved to 10.1 after getting blood transfusions, started trending down again.  Might be some element of bone marrow suppression with recent infection.  Currently stable at 9.1 -Continue to monitor -Transfuse if below 8  Acute biliary pancreatitis secondary to choledocholithiasis.  Lipase greater than 1000 on admission.  This was confirmed on CT scan.  Underwent successful ERCP with GI, found to have severely dilated common bile duct with a bile stone, no attempt was made for stone removal but a stent was placed which needs to be removed in 1 month with a second ERCP. He was placed on Zosyn due to worsening hypotension. Preliminary blood cultures negative in 24 hours. -Continue with Zosyn -Patient will be on clear liquid diet as recommended by GI today. -Monitor for any sign of perforation or cholangitis.  Nursing concern of dark-colored bowel movement.  Hemoglobin currently stable around 9.1.  Can be due to some bleeding during procedure. -Monitor for any melena or hematochezia -Monitor hemoglobin  Acute on chronic thrombocytopenia.  Baseline platelet count around 100.  Today at 30   Hematology was consulted and they are recommending 1 unit of platelet before the ERCP and 1 during the procedure. -Recommending to proceed with platelet transfusion is started bleeding or if counts drop below 15. -Some concern of peripheral destruction but they are not recommending steroid at this time, might need steroid and/or IVIG. -Appreciate hematology recommendations.  Acute on chronic hypotension.  Some worsening might be due to GI bleed.  On midodrine  at baseline.  Goal MAP of 55. -Continue with midodrine -Continue to monitor  Permanent atrial fibrillation.  Cardiology is on board, he was placed on amiodarone. -Home dose of Eliquis is on hold for a possible ERCP and acute GI blood loss.  He also  has worsening thrombocytopenia. -Continue to monitor  CAD with severe single-vessel disease.  Medical management per cardiology at this time. -Holding Eliquis and Plavix. -Currently on aspirin only which needs to be discontinued after starting Eliquis and Plavix per cardiology.  Multiple decubitus ulcers.  Wound care was consulted -Continue with wound care  History of PAD.  S/p bilateral lower extremity amputations. -Continue with aspirin and statin -Holding Eliquis and Plavix   Objective: Vitals:   02/04/21 1153 02/04/21 1200 02/04/21 1215 02/04/21 1300  BP: 90/66 92/70 (!) 85/67 90/67  Pulse: (!) 104  95   Resp: 17 20 14 11   Temp: (!) 96.6 F (35.9 C)  (!) 97 F (36.1 C)   TempSrc:      SpO2: 97%  96%   Weight:      Height:        Intake/Output Summary (Last 24 hours) at 02/04/2021 1407 Last data filed at 02/04/2021 1300 Gross per 24 hour  Intake 2511.65 ml  Output 0 ml  Net 2511.65 ml    Filed Weights   02/01/21 2052 02/02/21 1555 02/04/21 1009  Weight: 99 kg 100.2 kg 100.2 kg    Examination:  General.  Lethargic elderly man, in no acute distress. Pulmonary.  Lungs clear bilaterally, normal respiratory effort. CV.  Regular rate and rhythm, no JVD, rub or murmur. Abdomen.  Soft, nontender, nondistended, BS positive. CNS.  Alert and oriented .  No focal neurologic deficit. Extremities.  Right BKA, left AKA Psychiatry.  Judgment and insight appears normal.   DVT prophylaxis: SCDs, holding home Eliquis-can resume once cleared from GI Code Status: Full Family Communication:  Disposition Plan:  Status is: Inpatient  Remains inpatient appropriate because: Severity of illness   Level of care: Stepdown  All the records are reviewed and case discussed with Care Management/Social Worker. Management plans discussed with the patient, nursing and they are in agreement.  Consultants:  PCCM Cardiology GI Nephrology  Procedures:  Antimicrobials:   Zosyn  Data Reviewed: I have personally reviewed following labs and imaging studies  CBC: Recent Labs  Lab 01/31/21 0909 02/01/21 0618 02/02/21 0647 02/02/21 1807 02/03/21 0501 02/03/21 1302 02/04/21 0415  WBC 7.4   < > 4.0 4.6 4.2 6.0 4.0  NEUTROABS 6.0  --   --  3.9  --  4.4  --   HGB 8.5*   < > 6.9* 10.1* 9.1* 9.6* 9.1*  HCT 26.9*   < > 21.7* 31.4* 28.0* 30.2* 28.0*  MCV 95.4   < > 95.6 94.0 93.3 94.7 94.3  PLT 62*   < > 45* 41* 34* 57* 41*   < > = values in this interval not displayed.    Basic Metabolic Panel: Recent Labs  Lab 02/01/21 0618 02/02/21 0030 02/02/21 0647 02/02/21 1807 02/03/21 0501  NA 136 133* 135 136 133*  K 5.8* 3.9 4.3 4.4 4.5  CL 100 96* 98 100 98  CO2 23 26 26 25 25   GLUCOSE 78 87 87 85 89  BUN 77* 36* 42* 47* 54*  CREATININE 8.12* 4.52* 4.75* 5.06* 5.56*  CALCIUM 8.4* 8.7* 8.4* 8.7* 8.1*  MG  --  1.4*  --   --  1.8  PHOS 8.0*  --   --   --   --  GFR: Estimated Creatinine Clearance: 13.6 mL/min (A) (by C-G formula based on SCr of 5.56 mg/dL (H)). Liver Function Tests: Recent Labs  Lab 01/31/21 0909 02/01/21 0618 02/02/21 0647 02/02/21 1807 02/03/21 1302  AST 67* 51* 40 45* 34  ALT 44 42 32 38 30  ALKPHOS 387* 332* 423* 457* 364*  BILITOT 4.2* 3.3* 3.6* 3.8* 2.6*  PROT 5.7* 5.2* 5.3* 5.5* 5.5*  ALBUMIN 2.9* 2.8* 2.8* 2.9* 2.7*    Recent Labs  Lab 01/31/21 0909  LIPASE 1,021*    Recent Labs  Lab 02/02/21 0030  AMMONIA 29    Coagulation Profile: Recent Labs  Lab 02/02/21 0647  INR 1.9*    Cardiac Enzymes: No results for input(s): CKTOTAL, CKMB, CKMBINDEX, TROPONINI in the last 168 hours. BNP (last 3 results) No results for input(s): PROBNP in the last 8760 hours. HbA1C: No results for input(s): HGBA1C in the last 72 hours. CBG: Recent Labs  Lab 02/03/21 1927 02/03/21 2335 02/04/21 0329 02/04/21 0725 02/04/21 1156  GLUCAP 111* 138* 104* 94 106*    Lipid Profile: No results for input(s): CHOL,  HDL, LDLCALC, TRIG, CHOLHDL, LDLDIRECT in the last 72 hours. Thyroid Function Tests: No results for input(s): TSH, T4TOTAL, FREET4, T3FREE, THYROIDAB in the last 72 hours. Anemia Panel: Recent Labs    02/02/21 0647  VITAMINB12 2,319*  FOLATE 10.6  FERRITIN 1,707*  TIBC 118*  IRON 23*  RETICCTPCT 1.3    Sepsis Labs: Recent Labs  Lab 02/02/21 1807 02/02/21 2135 02/03/21 0430 02/04/21 0413  PROCALCITON 21.06  --  17.01 9.28  LATICACIDVEN 1.7 2.1*  --   --      Recent Results (from the past 240 hour(s))  Resp Panel by RT-PCR (Flu A&B, Covid) Nasopharyngeal Swab     Status: None   Collection Time: 01/31/21  9:11 AM   Specimen: Nasopharyngeal Swab; Nasopharyngeal(NP) swabs in vial transport medium  Result Value Ref Range Status   SARS Coronavirus 2 by RT PCR NEGATIVE NEGATIVE Final    Comment: (NOTE) SARS-CoV-2 target nucleic acids are NOT DETECTED.  The SARS-CoV-2 RNA is generally detectable in upper respiratory specimens during the acute phase of infection. The lowest concentration of SARS-CoV-2 viral copies this assay can detect is 138 copies/mL. A negative result does not preclude SARS-Cov-2 infection and should not be used as the sole basis for treatment or other patient management decisions. A negative result may occur with  improper specimen collection/handling, submission of specimen other than nasopharyngeal swab, presence of viral mutation(s) within the areas targeted by this assay, and inadequate number of viral copies(<138 copies/mL). A negative result must be combined with clinical observations, patient history, and epidemiological information. The expected result is Negative.  Fact Sheet for Patients:  EntrepreneurPulse.com.au  Fact Sheet for Healthcare Providers:  IncredibleEmployment.be  This test is no t yet approved or cleared by the [place] FDA and  has been authorized for detection and/or diagnosis of  SARS-CoV-2 by FDA under an Emergency Use Authorization (EUA). This EUA will remain  in effect (meaning this test can be used) for the duration of the COVID-19 declaration under Section 564(b)(1) of the Act, 21 U.S.C.section 360bbb-3(b)(1), unless the authorization is terminated  or revoked sooner.       Influenza A by PCR NEGATIVE NEGATIVE Final   Influenza B by PCR NEGATIVE NEGATIVE Final    Comment: (NOTE) The Xpert Xpress SARS-CoV-2/FLU/RSV plus assay is intended as an aid in the diagnosis of influenza from Nasopharyngeal swab specimens and should  not be used as a sole basis for treatment. Nasal washings and aspirates are unacceptable for Xpert Xpress SARS-CoV-2/FLU/RSV testing.  Fact Sheet for Patients: EntrepreneurPulse.com.au  Fact Sheet for Healthcare Providers: IncredibleEmployment.be  This test is not yet approved or cleared by the [place] FDA and has been authorized for detection and/or diagnosis of SARS-CoV-2 by FDA under an Emergency Use Authorization (EUA). This EUA will remain in effect (meaning this test can be used) for the duration of the COVID-19 declaration under Section 564(b)(1) of the Act, 21 U.S.C. section 360bbb-3(b)(1), unless the authorization is terminated or revoked.  Performed at [facility], [address] [place], [place] [address]   MRSA Next Gen by PCR, Nasal     Status: None   Collection Time: 02/02/21  9:21 AM   Specimen: Nasal Mucosa; Nasal Swab  Result Value Ref Range Status   MRSA by PCR Next Gen NOT DETECTED NOT DETECTED Final    Comment: (NOTE) The GeneXpert MRSA Assay (FDA approved for NASAL specimens only), is one component of a comprehensive MRSA colonization surveillance program. It is not intended to diagnose MRSA infection nor to guide or monitor treatment for MRSA infections. Test performance is not FDA approved in patients less than 15 years old. Performed at [facility], [address] [place], [place] [address]   CULTURE, BLOOD (ROUTINE X 2) w Reflex to ID Panel     Status: None (Preliminary result)   Collection Time: 02/02/21  6:07 PM   Specimen: BLOOD  Result Value Ref Range Status   Specimen Description BLOOD BLOOD RIGHT HAND  Final   Special Requests   Final    BOTTLES DRAWN AEROBIC AND ANAEROBIC Blood Culture adequate volume   Culture   Final    NO GROWTH 2 DAYS Performed at [facility], [address], [place], [place] [address]    Report Status PENDING  Incomplete  CULTURE, BLOOD (ROUTINE X 2) w Reflex to ID Panel     Status: None (Preliminary result)   Collection Time: 02/02/21  6:07 PM   Specimen: BLOOD  Result Value Ref Range Status   Specimen Description BLOOD BLOOD RIGHT FOREARM  Final   Special Requests   Final    BOTTLES DRAWN AEROBIC AND ANAEROBIC Blood Culture adequate volume   Culture   Final    NO GROWTH 2 DAYS Performed at [facility], [address], [place], [place] [address]    Report Status PENDING  Incomplete      Radiology Studies: DG Wrist Complete Right  Result Date: 02/02/2021 CLINICAL DATA:  Right wrist pain. EXAM: RIGHT WRIST - COMPLETE 3+ VIEW COMPARISON:  None. FINDINGS: There is no acute fracture or dislocation. Degenerative changes with narrowing of the radiocarpal distance and subcortical cystic changes of the carpal bones. Vascular calcifications noted. The soft tissues are grossly unremarkable. IMPRESSION: 1. No acute fracture or dislocation. 2. Degenerative changes. Electronically Signed   By: Anner Crete M.D.   On: 02/02/2021 19:12   DG C-Arm 1-60 Min-No Report  Result Date: 02/04/2021 Fluoroscopy was utilized by the requesting physician.  No radiographic interpretation.    Scheduled Meds:  sodium chloride   Intravenous Once   aspirin EC  81 mg Oral Daily   atorvastatin  40 mg Oral QHS   chlorhexidine  15 mL Mouth Rinse BID   Chlorhexidine Gluconate Cloth   6 each Topical Q0600   clotrimazole  1 application Topical BID   diclofenac Sodium  2 g Topical q AM   epoetin (EPOGEN/PROCRIT) injection  10,000 Units Intravenous Q T,Th,Sa-HD   finasteride  5 mg Oral Daily   loratadine  10 mg Oral Once per day on Sun Mon Wed Fri   mouth rinse  15 mL Mouth Rinse q12n4p   [START ON 02/05/2021] midodrine  10 mg Oral Q0600   pantoprazole (PROTONIX) IV  40 mg Intravenous Q12H   pregabalin  75 mg Oral BID   rOPINIRole  1 mg Oral QHS   sevelamer carbonate  1,600 mg Oral 2 times per day on Tue Thu Sat   sevelamer carbonate  1,600 mg Oral 3 times per day on Sun Mon Wed Fri   vitamin B-12  1,000 mcg Oral Daily   Continuous Infusions:  sodium chloride 100 mL/hr at 02/04/21 1300   sodium chloride     sodium chloride     amiodarone 30 mg/hr (02/04/21 1310)   piperacillin-tazobactam (ZOSYN)  IV 2.25 g (02/04/21 1356)     LOS: 4 days   Time spent: 45 minutes. More than 50% of the time was spent in counseling/coordination of care  Lorella Nimrod, MD [facility]  If 7PM-7AM, please contact night-coverage [email or web address]  02/04/2021, 2:08 PM   This record has been created using Systems analyst. Errors have been sought and corrected,but may not always be located. Such creation errors do not reflect on the standard of care.

## 2021-02-04 NOTE — Progress Notes (Signed)
[facility]  ROUNDING NOTE   Subjective:   Antonio Phillips is a 75 year old male with past medical history including hypotension, obesity, peripheral artery disease, diabetes, hyperlipidemia, and end-stage renal disease on dialysis.  Patient presents to emergency department with abdominal pain persisting for 2 days.  Patient admitted for further evaluation for Choledocholithiasis [K80.50] Pain [R52]  Patient is known to our practice for previous admissions and receives outpatient dialysis at [facility], supervised by Dr. Candiss Norse.  He receives dialysis on a Tuesday Thursday Saturday schedule.    Update: Patient remains in the critical care unit for amiodarone drip.  Underwent hemodialysis yesterday.  We will attempt to get him back on schedule today.    Objective:  Vital signs in last 24 hours:  Temp:  [97.1 F (36.2 C)-98.3 F (36.8 C)] 98 F (36.7 C) (12/01 0900) Pulse Rate:  [28-127] 98 (12/01 0900) Resp:  [8-21] 14 (12/01 0900) BP: (70-105)/(53-71) 87/63 (12/01 0900) SpO2:  [94 %-100 %] 98 % (12/01 0900)  Weight change:  Filed Weights   02/01/21 2052 02/02/21 1555  Weight: 99 kg 100.2 kg    Intake/Output: I/O last 3 completed shifts: In: 4565.4 [P.O.:360; I.V.:3345.4; Blood:610; IV Piggyback:250] Out: 0    Intake/Output this shift:  Total I/O In: 230.6 [I.V.:230.6] Out: -   Physical Exam: General: NAD, resting comfortably  Head: Normocephalic, atraumatic. Moist oral mucosal membranes  Eyes: Anicteric  Lungs:  Clear to auscultation, normal effort  Heart: Irregular  Abdomen:  Soft, tender, mild distention  Extremities: No peripheral edema.  Right BKA left AKA  Neurologic: Nonfocal, moving all four extremities  Skin: No lesions  Access: Left AVF    Basic Metabolic Panel: Recent Labs  Lab 02/01/21 0618 02/02/21 0030 02/02/21 0647 02/02/21 1807 02/03/21 0501  NA 136 133* 135 136 133*  K 5.8* 3.9 4.3 4.4 4.5  CL 100 96* 98 100 98  CO2  23 26 26 25 25   GLUCOSE 78 87 87 85 89  BUN 77* 36* 42* 47* 54*  CREATININE 8.12* 4.52* 4.75* 5.06* 5.56*  CALCIUM 8.4* 8.7* 8.4* 8.7* 8.1*  MG  --  1.4*  --   --  1.8  PHOS 8.0*  --   --   --   --      Liver Function Tests: Recent Labs  Lab 01/31/21 0909 02/01/21 0618 02/02/21 0647 02/02/21 1807 02/03/21 1302  AST 67* 51* 40 45* 34  ALT 44 42 32 38 30  ALKPHOS 387* 332* 423* 457* 364*  BILITOT 4.2* 3.3* 3.6* 3.8* 2.6*  PROT 5.7* 5.2* 5.3* 5.5* 5.5*  ALBUMIN 2.9* 2.8* 2.8* 2.9* 2.7*    Recent Labs  Lab 01/31/21 0909  LIPASE 1,021*    Recent Labs  Lab 02/02/21 0030  AMMONIA 29     CBC: Recent Labs  Lab 01/31/21 0909 02/01/21 0618 02/02/21 0647 02/02/21 1807 02/03/21 0501 02/03/21 1302  WBC 7.4 5.1 4.0 4.6 4.2 6.0  NEUTROABS 6.0  --   --  3.9  --  4.4  HGB 8.5* 7.5* 6.9* 10.1* 9.1* 9.6*  HCT 26.9* 23.7* 21.7* 31.4* 28.0* 30.2*  MCV 95.4 96.0 95.6 94.0 93.3 94.7  PLT 62* 48* 45* 41* 34* 57*     Cardiac Enzymes: No results for input(s): CKTOTAL, CKMB, CKMBINDEX, TROPONINI in the last 168 hours.  BNP: Invalid input(s): POCBNP  CBG: Recent Labs  Lab 02/03/21 1626 02/03/21 1927 02/03/21 2335 02/04/21 0329 02/04/21 0725  GLUCAP 110* 111* 138* 104* 94  Microbiology: Results for orders placed or performed during the hospital encounter of 01/31/21  Resp Panel by RT-PCR (Flu A&B, Covid) Nasopharyngeal Swab     Status: None   Collection Time: 01/31/21  9:11 AM   Specimen: Nasopharyngeal Swab; Nasopharyngeal(NP) swabs in vial transport medium  Result Value Ref Range Status   SARS Coronavirus 2 by RT PCR NEGATIVE NEGATIVE Final    Comment: (NOTE) SARS-CoV-2 target nucleic acids are NOT DETECTED.  The SARS-CoV-2 RNA is generally detectable in upper respiratory specimens during the acute phase of infection. The lowest concentration of SARS-CoV-2 viral copies this assay can detect is 138 copies/mL. A negative result does not preclude  SARS-Cov-2 infection and should not be used as the sole basis for treatment or other patient management decisions. A negative result may occur with  improper specimen collection/handling, submission of specimen other than nasopharyngeal swab, presence of viral mutation(s) within the areas targeted by this assay, and inadequate number of viral copies(<138 copies/mL). A negative result must be combined with clinical observations, patient history, and epidemiological information. The expected result is Negative.  Fact Sheet for Patients:  EntrepreneurPulse.com.au  Fact Sheet for Healthcare Providers:  IncredibleEmployment.be  This test is no t yet approved or cleared by the [place] FDA and  has been authorized for detection and/or diagnosis of SARS-CoV-2 by FDA under an Emergency Use Authorization (EUA). This EUA will remain  in effect (meaning this test can be used) for the duration of the COVID-19 declaration under Section 564(b)(1) of the Act, 21 U.S.C.section 360bbb-3(b)(1), unless the authorization is terminated  or revoked sooner.       Influenza A by PCR NEGATIVE NEGATIVE Final   Influenza B by PCR NEGATIVE NEGATIVE Final    Comment: (NOTE) The Xpert Xpress SARS-CoV-2/FLU/RSV plus assay is intended as an aid in the diagnosis of influenza from Nasopharyngeal swab specimens and should not be used as a sole basis for treatment. Nasal washings and aspirates are unacceptable for Xpert Xpress SARS-CoV-2/FLU/RSV testing.  Fact Sheet for Patients: EntrepreneurPulse.com.au  Fact Sheet for Healthcare Providers: IncredibleEmployment.be  This test is not yet approved or cleared by the [place] FDA and has been authorized for detection and/or diagnosis of SARS-CoV-2 by FDA under an Emergency Use Authorization (EUA). This EUA will remain in effect (meaning this test can be used) for the duration of  the COVID-19 declaration under Section 564(b)(1) of the Act, 21 U.S.C. section 360bbb-3(b)(1), unless the authorization is terminated or revoked.  Performed at [facility], [address] [place], [place] [address]   MRSA Next Gen by PCR, Nasal     Status: None   Collection Time: 02/02/21  9:21 AM   Specimen: Nasal Mucosa; Nasal Swab  Result Value Ref Range Status   MRSA by PCR Next Gen NOT DETECTED NOT DETECTED Final    Comment: (NOTE) The GeneXpert MRSA Assay (FDA approved for NASAL specimens only), is one component of a comprehensive MRSA colonization surveillance program. It is not intended to diagnose MRSA infection nor to guide or monitor treatment for MRSA infections. Test performance is not FDA approved in patients less than 51 years old. Performed at [facility], [address] [place], [place] [address]   CULTURE, BLOOD (ROUTINE X 2) w Reflex to ID Panel     Status: None (Preliminary result)   Collection Time: 02/02/21  6:07 PM   Specimen: BLOOD  Result Value Ref Range Status   Specimen Description BLOOD BLOOD RIGHT HAND  Final  Special Requests   Final    BOTTLES DRAWN AEROBIC AND ANAEROBIC Blood Culture adequate volume   Culture   Final    NO GROWTH 2 DAYS Performed at [facility], [address] [place], [place] [address]    Report Status PENDING  Incomplete  CULTURE, BLOOD (ROUTINE X 2) w Reflex to ID Panel     Status: None (Preliminary result)   Collection Time: 02/02/21  6:07 PM   Specimen: BLOOD  Result Value Ref Range Status   Specimen Description BLOOD BLOOD RIGHT FOREARM  Final   Special Requests   Final    BOTTLES DRAWN AEROBIC AND ANAEROBIC Blood Culture adequate volume   Culture   Final    NO GROWTH 2 DAYS Performed at [facility], [address] [place], [place] [address]    Report Status PENDING  Incomplete    Coagulation Studies: Recent Labs    02/02/21 0647  LABPROT 21.9*  INR 1.9*      Urinalysis: No results for input(s): COLORURINE, LABSPEC, PHURINE, GLUCOSEU, HGBUR, BILIRUBINUR, KETONESUR, PROTEINUR, UROBILINOGEN, NITRITE, LEUKOCYTESUR in the last 72 hours.  Invalid input(s): APPERANCEUR    Imaging: DG Wrist Complete Right  Result Date: 02/02/2021 CLINICAL DATA:  Right wrist pain. EXAM: RIGHT WRIST - COMPLETE 3+ VIEW COMPARISON:  None. FINDINGS: There is no acute fracture or dislocation. Degenerative changes with narrowing of the radiocarpal distance and subcortical cystic changes of the carpal bones. Vascular calcifications noted. The soft tissues are grossly unremarkable. IMPRESSION: 1. No acute fracture or dislocation. 2. Degenerative changes. Electronically Signed   By: Anner Crete M.D.   On: 02/02/2021 19:12     Medications:    sodium chloride 100 mL/hr at 02/04/21 0900   sodium chloride     sodium chloride     sodium chloride     amiodarone 30 mg/hr (02/04/21 0900)   piperacillin-tazobactam (ZOSYN)  IV Stopped (02/04/21 0557)    sodium chloride   Intravenous Once   aspirin EC  81 mg Oral Daily   atorvastatin  40 mg Oral QHS   chlorhexidine  15 mL Mouth Rinse BID   Chlorhexidine Gluconate Cloth  6 each Topical Q0600   clotrimazole  1 application Topical BID   diclofenac Sodium  2 g Topical q AM   epoetin (EPOGEN/PROCRIT) injection  10,000 Units Intravenous Q T,Th,Sa-HD   finasteride  5 mg Oral Daily   loratadine  10 mg Oral Once per day on Sun Mon Wed Fri   mouth rinse  15 mL Mouth Rinse q12n4p   midodrine  10 mg Oral Q T,Th,Sa-HD   midodrine  5 mg Oral Q0600   pantoprazole (PROTONIX) IV  40 mg Intravenous Q12H   pregabalin  75 mg Oral BID   rOPINIRole  1 mg Oral QHS   sevelamer carbonate  1,600 mg Oral 2 times per day on Tue Thu Sat   sevelamer carbonate  1,600 mg Oral 3 times per day on Sun Mon Wed Fri   vitamin B-12  1,000 mcg Oral Daily     Assessment/ Plan:  Antonio Phillips is a 75 y.o.  male with past medical history  including hypotension, obesity, peripheral artery disease, diabetes, hyperlipidemia, and end-stage renal disease on dialysis.  Patient presents to emergency department with abdominal pain persisting for 2 days.  Patient admitted for further evaluation for Choledocholithiasis [K80.50] Pain [R52]  [facility]/TTS/Lt AVF   End-stage renal disease with hyperkalemia on dialysis.  We  will attempt to get the patient back on his normal dialysis schedule.  Therefore we will plan for short dialysis treatment today.  2. Anemia of chronic kidney disease Lab Results  Component Value Date   HGB 9.6 (L) 02/03/2021    Start the patient on Epogen tomorrow. Patient to receive Epogen 10,000 units IV with dialysis today.  3. Secondary Hyperparathyroidism: Lab Results  Component Value Date   CALCIUM 8.1 (L) 02/03/2021   PHOS 8.0 (H) 02/01/2021  Recheck serum phosphorus today and continue Renvela.    LOS: 4 Glorianna Gott 12/1/20229:16 AM

## 2021-02-05 ENCOUNTER — Other Ambulatory Visit: Payer: Self-pay

## 2021-02-05 ENCOUNTER — Encounter: Payer: Self-pay | Admitting: Gastroenterology

## 2021-02-05 ENCOUNTER — Inpatient Hospital Stay: Payer: Medicare Other

## 2021-02-05 DIAGNOSIS — N186 End stage renal disease: Secondary | ICD-10-CM | POA: Diagnosis not present

## 2021-02-05 DIAGNOSIS — Z7189 Other specified counseling: Secondary | ICD-10-CM

## 2021-02-05 DIAGNOSIS — Z66 Do not resuscitate: Secondary | ICD-10-CM

## 2021-02-05 DIAGNOSIS — R1011 Right upper quadrant pain: Secondary | ICD-10-CM | POA: Diagnosis not present

## 2021-02-05 DIAGNOSIS — K8050 Calculus of bile duct without cholangitis or cholecystitis without obstruction: Secondary | ICD-10-CM | POA: Diagnosis not present

## 2021-02-05 DIAGNOSIS — I4891 Unspecified atrial fibrillation: Secondary | ICD-10-CM | POA: Diagnosis not present

## 2021-02-05 LAB — BLOOD CULTURE ID PANEL (REFLEXED) - BCID2

## 2021-02-05 LAB — HEPATIC FUNCTION PANEL
ALT: 18 U/L (ref 0–44)
AST: 15 U/L (ref 15–41)
Albumin: 2.4 g/dL — ABNORMAL LOW (ref 3.5–5.0)
Alkaline Phosphatase: 268 U/L — ABNORMAL HIGH (ref 38–126)
Bilirubin, Direct: 0.8 mg/dL — ABNORMAL HIGH (ref 0.0–0.2)
Indirect Bilirubin: 0.9 mg/dL (ref 0.3–0.9)
Total Bilirubin: 1.7 mg/dL — ABNORMAL HIGH (ref 0.3–1.2)
Total Protein: 5.2 g/dL — ABNORMAL LOW (ref 6.5–8.1)

## 2021-02-05 LAB — CBC
HCT: 29.8 % — ABNORMAL LOW (ref 39.0–52.0)
Hemoglobin: 9.6 g/dL — ABNORMAL LOW (ref 13.0–17.0)
MCH: 30.6 pg (ref 26.0–34.0)
MCHC: 32.2 g/dL (ref 30.0–36.0)
MCV: 94.9 fL (ref 80.0–100.0)
Platelets: 51 10*3/uL — ABNORMAL LOW (ref 150–400)
RBC: 3.14 MIL/uL — ABNORMAL LOW (ref 4.22–5.81)
RDW: 17.3 % — ABNORMAL HIGH (ref 11.5–15.5)
WBC: 4.9 10*3/uL (ref 4.0–10.5)
nRBC: 0 % (ref 0.0–0.2)

## 2021-02-05 LAB — GLUCOSE, CAPILLARY
Glucose-Capillary: 88 mg/dL (ref 70–99)
Glucose-Capillary: 93 mg/dL (ref 70–99)
Glucose-Capillary: 80 mg/dL (ref 70–99)
Glucose-Capillary: 114 mg/dL — ABNORMAL HIGH (ref 70–99)
Glucose-Capillary: 108 mg/dL — ABNORMAL HIGH (ref 70–99)
Glucose-Capillary: 99 mg/dL (ref 70–99)

## 2021-02-05 LAB — BPAM PLATELET PHERESIS
Blood Product Expiration Date: 202212042359
ISSUE DATE / TIME: 202212011012
Unit Type and Rh: 5100

## 2021-02-05 LAB — TROPONIN I (HIGH SENSITIVITY)
Troponin I (High Sensitivity): 147 ng/L (ref <18)
Troponin I (High Sensitivity): 146 ng/L (ref <18)

## 2021-02-05 LAB — PREPARE PLATELET PHERESIS: Unit division: 0

## 2021-02-05 MED ORDER — ALBUMIN HUMAN 25 % IV SOLN
INTRAVENOUS | Status: AC
  Administered 2021-02-05: 12.5 g
  Filled 2021-02-05: qty 100

## 2021-02-05 MED ORDER — CLOPIDOGREL BISULFATE 75 MG PO TABS
75.0000 mg | ORAL_TABLET | Freq: Every day | ORAL | Status: DC
  Administered 2021-02-06 – 2021-02-09 (×4): 75 mg via ORAL
  Filled 2021-02-05 (×4): qty 1

## 2021-02-05 MED ORDER — APIXABAN 5 MG PO TABS
5.0000 mg | ORAL_TABLET | Freq: Two times a day (BID) | ORAL | Status: DC
  Administered 2021-02-05 – 2021-02-09 (×9): 5 mg via ORAL
  Filled 2021-02-05 (×9): qty 1

## 2021-02-05 MED ORDER — MIDODRINE HCL 5 MG PO TABS
10.0000 mg | ORAL_TABLET | Freq: Three times a day (TID) | ORAL | Status: DC
  Administered 2021-02-05 – 2021-02-09 (×13): 10 mg via ORAL
  Filled 2021-02-05 (×13): qty 2

## 2021-02-05 NOTE — Progress Notes (Signed)
Palliative Care Progress Note, Assessment & Plan   Patient Name: Antonio Phillips       Date: 02/05/2021 DOB: Aug 02, 1945  Age: 75 y.o. MRN#: [id number] Attending Physician: Lorella Nimrod, MD Primary Care Physician: Marsh Dolly, MD Admit Date: 01/31/2021  Reason for Consultation/Follow-up: Establishing goals of care  Subjective: Patient is sitting in bed receiving hemodialysis.  He is in no apparent distress.  He is resting but easily arousable.  HPI: 75 y.o. male  with past medical history of HTN, HLD, morbid obesity, PAD, ESRD (HD), A. fib (Eliquis), and wheelchair-bound admitted on 01/31/2021 with complaints of abdominal and epigastric pain.  CT of abdomen and pelvis revealed splenomegaly mild pancreatic and peripancreatic inflammatory fat standing and cholelithiasis. Patient also found to have multifactorial shock and acute GI bleed.Plan was for ERCP. rapid response was called for low blood pressure and patient was subsequently transferred to ICU.   Palliative medicine was consulted to discuss goals of care.  Plan of Care: I have reviewed medical records including EPIC notes, labs and imaging, assessed the patient and then met with patient at bedside to discuss diagnosis prognosis, GOC, EOL wishes, disposition, and options.  I reiterated that I am an extra layer of support for the patient as he makes difficult decisions.  I touched on the topic of advance care planning when we met 2 days ago.  I again addressed CODE STATUS.  I outlined the difference between a DNR and a limited code as well as a full CODE STATUS.  Patient said he would never want to live on a ventilator.  Patient confirmed he would like CODE STATUS changed to DNR.  Therapeutic time and silence as well as active listening provided for  patient to share his thoughts and emotions.  He shared he is a man of God and has lived a good life.  He says he is not ready to die today but is fine whenever he has to die because he knows he gets to go to be with the [name].  He says it is not make any sense to worry about anything so he is just not going to worry.  We discussed patient's current illness and what it means in the larger context of patient's on-going co-morbidities.  Human mortality discussed.   Advance directives, concepts specific to code status, artificial feeding and hydration, and rehospitalization were considered and discussed.  Patient completed a MOST form.  Patient asked when he would be able to return to [facility].  During our discussion GI rounded on patient.  Next steps are for patient to tolerate fluid and food as well as have regular bowel movements without signs and symptoms of bleeding.  Discussed with patient/family the importance of continued conversation with family and the medical providers regarding overall plan of care and treatment options, ensuring decisions are within the context of the patient's values and GOCs.    Palliative Care services outpatient were explained and offered.  Patient agreed that he would like to have palliative outpatient follow him after discharge.  Questions and concerns were addressed. Patient was encouraged to call with questions or concerns.   Care plan was discussed with Dr. Reesa Chew, RN Alroy Dust, patient  Code Status: DNR  Prognosis:  Unable to determine  Discharge Planning: [facility] for rehab with Palliative care service follow-up  Recommendations/Plan: DNR and MOST forms completed and placed in shadow chart.  Copies made and sent for download to [facility]. Outpatient palliative referral placed  Physical Exam Constitutional:      General: He is not in acute distress.    Appearance: He is obese. He is not ill-appearing or toxic-appearing.  HENT:     Head:  Normocephalic and atraumatic.  Cardiovascular:     Rate and Rhythm: Normal rate.     Heart sounds: Normal heart sounds.  Pulmonary:     Effort: Pulmonary effort is normal.  Abdominal:     General: Abdomen is flat. Bowel sounds are normal.     Palpations: Abdomen is soft.  Skin:    General: Skin is warm and dry.  Neurological:     Mental Status: He is alert and oriented to person, place, and time.  Psychiatric:        Mood and Affect: Mood normal. Mood is not anxious.        Behavior: Behavior normal.                Palliative Assessment/Data: 50%       Total Time 60 minutes  Greater than 50%  of this time was spent counseling and coordinating care related to the above assessment and plan.  Thank you for allowing the Palliative Medicine Team to assist in the care of this patient.  Sauget Ilsa Iha, FNP-BC Palliative Medicine Team Team Phone # [phone]

## 2021-02-05 NOTE — Progress Notes (Addendum)
PHARMACY - PHYSICIAN COMMUNICATION CRITICAL VALUE ALERT - BLOOD CULTURE IDENTIFICATION (BCID)  Antonio Phillips is an 75 y.o. male who presented to [facility] on 01/31/2021 with a chief complaint of an Intra-abdominal infection.  Assessment:   12/2: Preliminary gram-stain shows 1 of 4 vials (aerobic only) growing GNR's. Reflexive BCID will now be sent. 12/2 updated w/ BCID --> enterobacterales = E.coli (no resistance detected). No change.  Name of physician (or Provider) Contacted: Dr. Sidney Ace  Current antibiotics: Zosyn 2.25g IV q8h (HD-dosing)  Changes to prescribed antibiotics recommended:  Patient is on recommended antibiotics - No changes needed  Results for orders placed or performed during the hospital encounter of 09/26/20  Blood Culture ID Panel (Reflexed) (Collected: 09/26/2020  4:06 PM)  Result Value Ref Range   Enterococcus faecalis NOT DETECTED NOT DETECTED   Enterococcus Faecium NOT DETECTED NOT DETECTED   Listeria monocytogenes NOT DETECTED NOT DETECTED   Staphylococcus species NOT DETECTED NOT DETECTED   Staphylococcus aureus (BCID) NOT DETECTED NOT DETECTED   Staphylococcus epidermidis NOT DETECTED NOT DETECTED   Staphylococcus lugdunensis NOT DETECTED NOT DETECTED   Streptococcus species NOT DETECTED NOT DETECTED   Streptococcus agalactiae NOT DETECTED NOT DETECTED   Streptococcus pneumoniae NOT DETECTED NOT DETECTED   Streptococcus pyogenes NOT DETECTED NOT DETECTED   A.calcoaceticus-baumannii NOT DETECTED NOT DETECTED   Bacteroides fragilis NOT DETECTED NOT DETECTED   Enterobacterales DETECTED (A) NOT DETECTED   Enterobacter cloacae complex NOT DETECTED NOT DETECTED   Escherichia coli DETECTED (A) NOT DETECTED   Klebsiella aerogenes NOT DETECTED NOT DETECTED   Klebsiella oxytoca NOT DETECTED NOT DETECTED   Klebsiella pneumoniae NOT DETECTED NOT DETECTED   Proteus species NOT DETECTED NOT DETECTED   Salmonella species NOT DETECTED NOT DETECTED   Serratia  marcescens NOT DETECTED NOT DETECTED   Haemophilus influenzae NOT DETECTED NOT DETECTED   Neisseria meningitidis NOT DETECTED NOT DETECTED   Pseudomonas aeruginosa NOT DETECTED NOT DETECTED   Stenotrophomonas maltophilia NOT DETECTED NOT DETECTED   Candida albicans NOT DETECTED NOT DETECTED   Candida auris NOT DETECTED NOT DETECTED   Candida glabrata NOT DETECTED NOT DETECTED   Candida krusei NOT DETECTED NOT DETECTED   Candida parapsilosis NOT DETECTED NOT DETECTED   Candida tropicalis NOT DETECTED NOT DETECTED   Cryptococcus neoformans/gattii NOT DETECTED NOT DETECTED   CTX-M ESBL NOT DETECTED NOT DETECTED   Carbapenem resistance IMP NOT DETECTED NOT DETECTED   Carbapenem resistance KPC NOT DETECTED NOT DETECTED   Carbapenem resistance NDM NOT DETECTED NOT DETECTED   Carbapenem resist OXA 48 LIKE NOT DETECTED NOT DETECTED   Carbapenem resistance VIM NOT DETECTED NOT DETECTED    Antonio Phillips 02/05/2021  7:55 PM

## 2021-02-05 NOTE — Progress Notes (Signed)
[facility]  ROUNDING NOTE   Subjective:   Antonio Phillips is a 75 year old male with past medical history including hypotension, obesity, peripheral artery disease, diabetes, hyperlipidemia, and end-stage renal disease on dialysis.  Patient presents to emergency department with abdominal pain persisting for 2 days.  Patient admitted for further evaluation for Choledocholithiasis [K80.50] Pain [R52]  Patient is known to our practice for previous admissions and receives outpatient dialysis at [facility], supervised by Dr. Candiss Norse.  He receives dialysis on a Tuesday Thursday Saturday schedule.    Update: Patient seen and evaluated during hemodialysis treatment today.  Appears to be tolerating well.  Maintained on amiodarone drip at the moment.    Objective:  Vital signs in last 24 hours:  Temp:  [96.4 F (35.8 C)-98.6 F (37 C)] 97.7 F (36.5 C) (12/02 0755) Pulse Rate:  [33-109] 95 (12/02 0900) Resp:  [10-20] 10 (12/02 0945) BP: (79-99)/(58-74) 92/63 (12/02 0945) SpO2:  [92 %-100 %] 92 % (12/02 0900) Weight:  [100.2 kg-102.8 kg] 102.8 kg (12/02 0755)  Weight change:  Filed Weights   02/02/21 1555 02/04/21 1009 02/05/21 0755  Weight: 100.2 kg 100.2 kg 102.8 kg    Intake/Output: I/O last 3 completed shifts: In: 2752.4 [I.V.:2212.3; Blood:290; IV Piggyback:250] Out: 0    Intake/Output this shift:  No intake/output data recorded.  Physical Exam: General: NAD, resting comfortably  Head: Normocephalic, atraumatic. Moist oral mucosal membranes  Eyes: Anicteric  Lungs:  Clear to auscultation, normal effort  Heart: Irregular  Abdomen:  Soft, tender, mild distention  Extremities: No peripheral edema.  Right BKA left AKA  Neurologic: Nonfocal, moving all four extremities  Skin: No lesions  Access: Left AVF    Basic Metabolic Panel: Recent Labs  Lab 02/01/21 0618 02/02/21 0030 02/02/21 0647 02/02/21 1807 02/03/21 0501  NA 136 133* 135 136 133*  K  5.8* 3.9 4.3 4.4 4.5  CL 100 96* 98 100 98  CO2 23 26 26 25 25   GLUCOSE 78 87 87 85 89  BUN 77* 36* 42* 47* 54*  CREATININE 8.12* 4.52* 4.75* 5.06* 5.56*  CALCIUM 8.4* 8.7* 8.4* 8.7* 8.1*  MG  --  1.4*  --   --  1.8  PHOS 8.0*  --   --   --   --      Liver Function Tests: Recent Labs  Lab 02/01/21 0618 02/02/21 0647 02/02/21 1807 02/03/21 1302 02/05/21 0416  AST 51* 40 45* 34 15  ALT 42 32 38 30 18  ALKPHOS 332* 423* 457* 364* 268*  BILITOT 3.3* 3.6* 3.8* 2.6* 1.7*  PROT 5.2* 5.3* 5.5* 5.5* 5.2*  ALBUMIN 2.8* 2.8* 2.9* 2.7* 2.4*    Recent Labs  Lab 01/31/21 0909  LIPASE 1,021*    Recent Labs  Lab 02/02/21 0030  AMMONIA 29     CBC: Recent Labs  Lab 01/31/21 0909 02/01/21 0618 02/02/21 1807 02/03/21 0501 02/03/21 1302 02/04/21 0415 02/05/21 0416  WBC 7.4   < > 4.6 4.2 6.0 4.0 4.9  NEUTROABS 6.0  --  3.9  --  4.4  --   --   HGB 8.5*   < > 10.1* 9.1* 9.6* 9.1* 9.6*  HCT 26.9*   < > 31.4* 28.0* 30.2* 28.0* 29.8*  MCV 95.4   < > 94.0 93.3 94.7 94.3 94.9  PLT 62*   < > 41* 34* 57* 41* 51*   < > = values in this interval not displayed.     Cardiac Enzymes:  No results for input(s): CKTOTAL, CKMB, CKMBINDEX, TROPONINI in the last 168 hours.  BNP: Invalid input(s): POCBNP  CBG: Recent Labs  Lab 02/04/21 1630 02/04/21 1930 02/04/21 2324 02/05/21 0315 02/05/21 0719  GLUCAP 86 110* 107* 88 93     Microbiology: Results for orders placed or performed during the hospital encounter of 01/31/21  Resp Panel by RT-PCR (Flu A&B, Covid) Nasopharyngeal Swab     Status: None   Collection Time: 01/31/21  9:11 AM   Specimen: Nasopharyngeal Swab; Nasopharyngeal(NP) swabs in vial transport medium  Result Value Ref Range Status   SARS Coronavirus 2 by RT PCR NEGATIVE NEGATIVE Final    Comment: (NOTE) SARS-CoV-2 target nucleic acids are NOT DETECTED.  The SARS-CoV-2 RNA is generally detectable in upper respiratory specimens during the acute phase of  infection. The lowest concentration of SARS-CoV-2 viral copies this assay can detect is 138 copies/mL. A negative result does not preclude SARS-Cov-2 infection and should not be used as the sole basis for treatment or other patient management decisions. A negative result may occur with  improper specimen collection/handling, submission of specimen other than nasopharyngeal swab, presence of viral mutation(s) within the areas targeted by this assay, and inadequate number of viral copies(<138 copies/mL). A negative result must be combined with clinical observations, patient history, and epidemiological information. The expected result is Negative.  Fact Sheet for Patients:  EntrepreneurPulse.com.au  Fact Sheet for Healthcare Providers:  IncredibleEmployment.be  This test is no t yet approved or cleared by the [place] FDA and  has been authorized for detection and/or diagnosis of SARS-CoV-2 by FDA under an Emergency Use Authorization (EUA). This EUA will remain  in effect (meaning this test can be used) for the duration of the COVID-19 declaration under Section 564(b)(1) of the Act, 21 U.S.C.section 360bbb-3(b)(1), unless the authorization is terminated  or revoked sooner.       Influenza A by PCR NEGATIVE NEGATIVE Final   Influenza B by PCR NEGATIVE NEGATIVE Final    Comment: (NOTE) The Xpert Xpress SARS-CoV-2/FLU/RSV plus assay is intended as an aid in the diagnosis of influenza from Nasopharyngeal swab specimens and should not be used as a sole basis for treatment. Nasal washings and aspirates are unacceptable for Xpert Xpress SARS-CoV-2/FLU/RSV testing.  Fact Sheet for Patients: EntrepreneurPulse.com.au  Fact Sheet for Healthcare Providers: IncredibleEmployment.be  This test is not yet approved or cleared by the [place] FDA and has been authorized for detection and/or diagnosis of SARS-CoV-2  by FDA under an Emergency Use Authorization (EUA). This EUA will remain in effect (meaning this test can be used) for the duration of the COVID-19 declaration under Section 564(b)(1) of the Act, 21 U.S.C. section 360bbb-3(b)(1), unless the authorization is terminated or revoked.  Performed at [facility], [address] [place], [place] [address]   MRSA Next Gen by PCR, Nasal     Status: None   Collection Time: 02/02/21  9:21 AM   Specimen: Nasal Mucosa; Nasal Swab  Result Value Ref Range Status   MRSA by PCR Next Gen NOT DETECTED NOT DETECTED Final    Comment: (NOTE) The GeneXpert MRSA Assay (FDA approved for NASAL specimens only), is one component of a comprehensive MRSA colonization surveillance program. It is not intended to diagnose MRSA infection nor to guide or monitor treatment for MRSA infections. Test performance is not FDA approved in patients less than 59 years old. Performed at [facility], [address] [place], [place] [address]   CULTURE,  BLOOD (ROUTINE X 2) w Reflex to ID Panel     Status: None (Preliminary result)   Collection Time: 02/02/21  6:07 PM   Specimen: BLOOD  Result Value Ref Range Status   Specimen Description BLOOD BLOOD RIGHT HAND  Final   Special Requests   Final    BOTTLES DRAWN AEROBIC AND ANAEROBIC Blood Culture adequate volume   Culture   Final    NO GROWTH 3 DAYS Performed at [facility], [address] [place], [place] [address]    Report Status PENDING  Incomplete  CULTURE, BLOOD (ROUTINE X 2) w Reflex to ID Panel     Status: None (Preliminary result)   Collection Time: 02/02/21  6:07 PM   Specimen: BLOOD  Result Value Ref Range Status   Specimen Description BLOOD BLOOD RIGHT FOREARM  Final   Special Requests   Final    BOTTLES DRAWN AEROBIC AND ANAEROBIC Blood Culture adequate volume   Culture   Final    NO GROWTH 3 DAYS Performed at [facility], [address] [place], [place]  [address]    Report Status PENDING  Incomplete    Coagulation Studies: No results for input(s): LABPROT, INR in the last 72 hours.   Urinalysis: No results for input(s): COLORURINE, LABSPEC, PHURINE, GLUCOSEU, HGBUR, BILIRUBINUR, KETONESUR, PROTEINUR, UROBILINOGEN, NITRITE, LEUKOCYTESUR in the last 72 hours.  Invalid input(s): APPERANCEUR    Imaging: DG C-Arm 1-60 Min-No Report  Result Date: 02/04/2021 Fluoroscopy was utilized by the requesting physician.  No radiographic interpretation.     Medications:    sodium chloride     sodium chloride     amiodarone 30 mg/hr (02/05/21 0200)   piperacillin-tazobactam (ZOSYN)  IV 2.25 g (02/05/21 0523)    sodium chloride   Intravenous Once   aspirin EC  81 mg Oral Daily   atorvastatin  40 mg Oral QHS   chlorhexidine  15 mL Mouth Rinse BID   Chlorhexidine Gluconate Cloth  6 each Topical Q0600   clotrimazole  1 application Topical BID   diclofenac Sodium  2 g Topical q AM   epoetin (EPOGEN/PROCRIT) injection  10,000 Units Intravenous Q T,Th,Sa-HD   finasteride  5 mg Oral Daily   loratadine  10 mg Oral Once per day on Sun Mon Wed Fri   mouth rinse  15 mL Mouth Rinse q12n4p   midodrine  10 mg Oral TID WC   pantoprazole (PROTONIX) IV  40 mg Intravenous Q12H   pregabalin  75 mg Oral BID   rOPINIRole  1 mg Oral QHS   sevelamer carbonate  1,600 mg Oral 2 times per day on Tue Thu Sat   sevelamer carbonate  1,600 mg Oral 3 times per day on Sun Mon Wed Fri   vitamin B-12  1,000 mcg Oral Daily     Assessment/ Plan:  Antonio Phillips is a 75 y.o.  male with past medical history including hypotension, obesity, peripheral artery disease, diabetes, hyperlipidemia, and end-stage renal disease on dialysis.  Patient presents to emergency department with abdominal pain persisting for 2 days.  Patient admitted for further evaluation for Choledocholithiasis [K80.50] Pain [R52]  [facility]/TTS/Lt AVF   End-stage renal disease  with hyperkalemia on dialysis.  Patient declined dialysis treatment today.  Therefore he is being dialyzed today.  We will attempt to get him back on his regular schedule by tomorrow if patient willing.  2. Anemia of chronic kidney disease Lab Results  Component Value  Date   HGB 9.6 (L) 02/05/2021    Hemoglobin 9.6 and close to target.  Maintain the patient on Epogen.  3. Secondary Hyperparathyroidism: Lab Results  Component Value Date   CALCIUM 8.1 (L) 02/03/2021   PHOS 8.0 (H) 02/01/2021  Continue Renvela and periodic monitoring of bone mineral metabolism parameters.    LOS: 5 Maysin Carstens 12/2/20229:50 AM

## 2021-02-05 NOTE — Progress Notes (Signed)
Progress Note  Patient Name: Antonio Phillips Date of Encounter: 02/05/2021  Primary Cardiologist: Ida Rogue, MD  Subjective   No chest pain or sob this AM.  Denies abd pain.  For HD @ bedside this AM.  Inpatient Medications    Scheduled Meds:  sodium chloride   Intravenous Once   aspirin EC  81 mg Oral Daily   atorvastatin  40 mg Oral QHS   chlorhexidine  15 mL Mouth Rinse BID   Chlorhexidine Gluconate Cloth  6 each Topical Q0600   clotrimazole  1 application Topical BID   diclofenac Sodium  2 g Topical q AM   epoetin (EPOGEN/PROCRIT) injection  10,000 Units Intravenous Q T,Th,Sa-HD   finasteride  5 mg Oral Daily   loratadine  10 mg Oral Once per day on Sun Mon Wed Fri   mouth rinse  15 mL Mouth Rinse q12n4p   midodrine  10 mg Oral TID WC   pantoprazole (PROTONIX) IV  40 mg Intravenous Q12H   pregabalin  75 mg Oral BID   rOPINIRole  1 mg Oral QHS   sevelamer carbonate  1,600 mg Oral 2 times per day on Tue Thu Sat   sevelamer carbonate  1,600 mg Oral 3 times per day on Sun Mon Wed Fri   vitamin B-12  1,000 mcg Oral Daily   Continuous Infusions:  sodium chloride     sodium chloride     albumin human     amiodarone 30 mg/hr (02/05/21 0200)   piperacillin-tazobactam (ZOSYN)  IV 2.25 g (02/05/21 0523)   PRN Meds: sodium chloride, sodium chloride, acetaminophen, alteplase, heparin, HYDROcodone-acetaminophen, lactulose, lidocaine (PF), lidocaine-prilocaine, loperamide, morphine injection, pentafluoroprop-tetrafluoroeth, polyvinyl alcohol   Vital Signs    Vitals:   02/05/21 0000 02/05/21 0400 02/05/21 0700 02/05/21 0755  BP: (!) 87/64 (!) 87/60 96/62   Pulse: 81 (!) 103 (!) 33 96  Resp: 10 11 16 15   Temp:  (!) 97 F (36.1 C)  97.7 F (36.5 C)  TempSrc:  Oral  Oral  SpO2: 98% 100% 95% (P) 93%  Weight:      Height:        Intake/Output Summary (Last 24 hours) at 02/05/2021 0815 Last data filed at 02/05/2021 0200 Gross per 24 hour  Intake 1190.99 ml   Output 0 ml  Net 1190.99 ml   Filed Weights   02/01/21 2052 02/02/21 1555 02/04/21 1009  Weight: 99 kg 100.2 kg 100.2 kg    Physical Exam   GEN: obese, in no acute distress.  HEENT: Grossly normal.  Neck: Supple, no JVD, carotid bruits, or masses. Cardiac: IR, IR, no murmurs, rubs, or gallops. No clubbing, cyanosis, edema.  Bilat BKAs. Respiratory:  Respirations regular and unlabored, clear to auscultation bilaterally. GI: obese, protuberant, nontender, BS + x 4. MS: no deformity or atrophy. Skin: warm and dry, no rash. Neuro:  Strength and sensation are intact. Psych: AAOx3.  Normal affect.  Labs    Chemistry Recent Labs  Lab 02/02/21 0647 02/02/21 1807 02/03/21 0501 02/03/21 1302  NA 135 136 133*  --   K 4.3 4.4 4.5  --   CL 98 100 98  --   CO2 26 25 25   --   GLUCOSE 87 85 89  --   BUN 42* 47* 54*  --   CREATININE 4.75* 5.06* 5.56*  --   CALCIUM 8.4* 8.7* 8.1*  --   PROT 5.3* 5.5*  --  5.5*  ALBUMIN 2.8* 2.9*  --  2.7*  AST 40 45*  --  34  ALT 32 38  --  30  ALKPHOS 423* 457*  --  364*  BILITOT 3.6* 3.8*  --  2.6*  GFRNONAA 12* 11* 10*  --   ANIONGAP 11 11 10   --      Hematology Recent Labs  Lab 02/03/21 1302 02/04/21 0415 02/05/21 0416  WBC 6.0 4.0 4.9  RBC 3.19* 2.97* 3.14*  HGB 9.6* 9.1* 9.6*  HCT 30.2* 28.0* 29.8*  MCV 94.7 94.3 94.9  MCH 30.1 30.6 30.6  MCHC 31.8 32.5 32.2  RDW 17.2* 17.2* 17.3*  PLT 57* 41* 51*    Cardiac Enzymes  Recent Labs  Lab 01/31/21 0909 01/31/21 1338 02/02/21 1807 02/02/21 2135  TROPONINIHS 95* 101* 356* 397*      BNP Recent Labs  Lab 01/31/21 0909  BNP 1,051.9*    Lipids  Lab Results  Component Value Date   CHOL 58 09/23/2020   HDL 23 (L) 09/23/2020   LDLCALC 22 09/23/2020   TRIG 63 09/23/2020   CHOLHDL 2.5 09/23/2020    HbA1c  Lab Results  Component Value Date   HGBA1C 5.5 11/10/2018    Radiology    DG Wrist Complete Right  Result Date: 02/02/2021 CLINICAL DATA:  Right wrist  pain. EXAM: RIGHT WRIST - COMPLETE 3+ VIEW COMPARISON:  None. FINDINGS: There is no acute fracture or dislocation. Degenerative changes with narrowing of the radiocarpal distance and subcortical cystic changes of the carpal bones. Vascular calcifications noted. The soft tissues are grossly unremarkable. IMPRESSION: 1. No acute fracture or dislocation. 2. Degenerative changes. Electronically Signed   By: Anner Crete M.D.   On: 02/02/2021 19:12   DG C-Arm 1-60 Min-No Report  Result Date: 02/04/2021 Fluoroscopy was utilized by the requesting physician.  No radiographic interpretation.    Telemetry    Afib - 80's this AM w/ trend between 80's to low 100's over past 24 hrs. - Personally Reviewed  Cardiac Studies   Limited 2D echo with Definity 09/28/2020: 1. Left ventricular ejection fraction, by estimation, is 45 to 50%. The  left ventricle has mildly decreased function. Left ventricular endocardial  border not optimally defined to evaluate regional wall motion. There is  mild left ventricular hypertrophy.   2. Right ventricular systolic function is moderately reduced. The right  ventricular size is normal. Mildly increased right ventricular wall  thickness. There is moderately elevated pulmonary artery systolic  pressure.   3. The mitral valve is abnormal. Trivial mitral valve regurgitation.   4. The aortic valve is tricuspid. There is mild thickening of the aortic  valve.   5. The inferior vena cava is dilated in size with <50% respiratory  variability, suggesting right atrial pressure of 15 mmHg.  __________   2D echo 09/27/2020:  1. Poor acoustic windows Endocardium is not well seen I would recomm  limited echo with Definity to help define LVEF and regional wall motion. .  There is mild left ventricular hypertrophy. Left ventricular diastolic  parameters are indeterminate.   2. RV is not seen well enough to evaluate function. . The right  ventricular size is mildly enlarged.    3. Left atrial size was severely dilated.   4. Right atrial size was severely dilated.   5. Mild mitral valve regurgitation.   6. Tricuspid valve regurgitation is mild to moderate.   7. The aortic valve is tricuspid. Aortic valve regurgitation is not  visualized. Mild aortic valve sclerosis is present, with  no evidence of  aortic valve stenosis.  __________   LHC 09/22/2020:   Ost LAD to Prox LAD lesion is 50% stenosed.   2nd Mrg lesion is 99% stenosed.   A drug-eluting stent was successfully placed using a STENT RESOLUTE ONYX 2.5X15.   Post intervention, there is a 30% residual stenosis.   1.  Severe single-vessel coronary artery disease with 99% subtotal stenosis of the second obtuse marginal branch, treated with a 2.5 x 15 mm resolute Onyx DES.  30% residual stenosis at the case completion due to resistant/calcified lesion type even with high-pressure noncompliant balloon angioplasty. 2.  Mild to moderate nonobstructive proximal LAD stenosis 3.  Mild nonobstructive RCA stenosis  Recommendations: Aggressive medical therapy, as long as no bleeding complications arise, would resume apixaban tomorrow, aspirin 81 mg daily x30 days, and clopidogrel 75 mg daily x minimum 6 months. ___________   GI Procedures  ERCP 12.1.2022 The major papilla was adjacent to a diverticulum. - The entire main bile duct was severely dilated. - Choledocholithiasis was found. Removal was not attempted; a stent was inserted. - One plastic stent was placed into the common bile duct. Impression: - Return patient to ICU for ongoing care. - Clear liquid diet. - Watch for pancreatitis, bleeding, perforation, and cholangitis. - Repeat ERCP in 1 month to remove stent off anticoagulation  Patient Profile     75 y.o. male with a hx of CAD s/p DES  OM2 in 09/2020, PAD s/p L BKA and R BKA, permanent Afib on Eliquis, ESRD on HD, hypotension, morbid obesity, diabetes, and demand ischemia who was admitted 11/29 w/ abd  pain and gallstone pancreatitis s/p ERCP and stenting of the CBD 12.1.2022.  Assessment & Plan    1.  Gallstone pancreatitis:  s/p ERCP 12.1.2022 w/ stenting of CBD and plan for f/u ERCP and stent removal in a month.  GI following.    2.  CAD/Demand Ischemia:  s/p dES  OM2 in 09/2020.  No chest pain or dyspnea.  Mod HsT elevation in the setting of gallstone pancreatitis/sepsis/hypotension/ESRD w/ elevated creat.  Suspect demand ischemia. Currently on ASA s/p ERCP.  Would like to resume home doses of plavix and eliquis once GI provides clearance to do so (will d/c ASA @ that time).  In setting of ongoing hypotension, ? blocker on hold.  3.  Hypotension:  BPs remain soft.  Titrating midodrine to 10mg  TID.  4.  Permanent Afib:  Rates better this AM - 80's, likely driven by reduced abd discomfort.  Home dose of ? blocker on hold in setting of hypotension req midodrine.  Eliquis on hold s/p ERCP.  Will plan to resume once cleared by GI.  5.  ESRD: for HD today.  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  ? blocker on hold in the setting of hypotension req midodrine.  Volume mgmt per nephrology/HD.  7.  Normocytic anemia:  stable.  Signed, Murray Hodgkins, NP  02/05/2021, 8:15 AM    For questions or updates, please contact   Please consult [email or web address] for contact info under Cardiology/STEMI.

## 2021-02-05 NOTE — Progress Notes (Signed)
Tolerated 3hours of treatment with 1liter net removal.Albumin given at start of treatment for blood pressure support.Epogen dose given with treatment. Hemastasis achieved,sites dressed with gauze/taped.

## 2021-02-05 NOTE — Hospital Course (Signed)
The major papilla was adjacent to a diverticulum. - The entire main bile duct was severely dilated. - Choledocholithiasis was found. Removal was not attempted; a stent was inserted. - One plastic stent was placed into the common bile duct. Impression: - Return patient to ICU for ongoing care. - Clear liquid diet. - Watch for pancreatitis, bleeding, perforation, and cholangitis. - Repeat ERCP in 1 month to remove stent off anticoagulation

## 2021-02-05 NOTE — Progress Notes (Signed)
Antonio Phillips , MD [address] [place], [place], [place], [address] [address] [place], [place], [place], [address] Phone: [phone]  Fax: [phone]   Antonio Phillips is being followed for gallstone pancreatitis with choledocholithiasis day 2 of follow up   Subjective: Feels well , denies any abdominal pain or discomfort . Taking orally    Objective: Vital signs in last 24 hours: Vitals:   02/05/21 0915 02/05/21 0930 02/05/21 0945 02/05/21 1000  BP: 94/68 (!) 90/58 92/63 (!) 93/58  Pulse:      Resp: 10 14 10 11   Temp:      TempSrc:      SpO2:      Weight:      Height:       Weight change:   Intake/Output Summary (Last 24 hours) at 02/05/2021 1006 Last data filed at 02/05/2021 0200 Gross per 24 hour  Intake 961.24 ml  Output 0 ml  Net 961.24 ml     Exam:  Abdomen: soft, nontender, normal bowel sounds   Lab Results: @LABTEST2 @ Micro Results: Recent Results (from the past 240 hour(s))  Resp Panel by RT-PCR (Flu A&B, Covid) Nasopharyngeal Swab     Status: None   Collection Time: 01/31/21  9:11 AM   Specimen: Nasopharyngeal Swab; Nasopharyngeal(NP) swabs in vial transport medium  Result Value Ref Range Status   SARS Coronavirus 2 by RT PCR NEGATIVE NEGATIVE Final    Comment: (NOTE) SARS-CoV-2 target nucleic acids are NOT DETECTED.  The SARS-CoV-2 RNA is generally detectable in upper respiratory specimens during the acute phase of infection. The lowest concentration of SARS-CoV-2 viral copies this assay can detect is 138 copies/mL. A negative result does not preclude SARS-Cov-2 infection and should not be used as the sole basis for treatment or other patient management decisions. A negative result may occur with  improper specimen collection/handling, submission of specimen other than nasopharyngeal swab, presence of viral mutation(s) within the areas targeted by this assay, and inadequate number of viral copies(<138 copies/mL). A negative  result must be combined with clinical observations, patient history, and epidemiological information. The expected result is Negative.  Fact Sheet for Patients:  EntrepreneurPulse.com.au  Fact Sheet for Healthcare Providers:  IncredibleEmployment.be  This test is no t yet approved or cleared by the [place] FDA and  has been authorized for detection and/or diagnosis of SARS-CoV-2 by FDA under an Emergency Use Authorization (EUA). This EUA will remain  in effect (meaning this test can be used) for the duration of the COVID-19 declaration under Section 564(b)(1) of the Act, 21 U.S.C.section 360bbb-3(b)(1), unless the authorization is terminated  or revoked sooner.       Influenza A by PCR NEGATIVE NEGATIVE Final   Influenza B by PCR NEGATIVE NEGATIVE Final    Comment: (NOTE) The Xpert Xpress SARS-CoV-2/FLU/RSV plus assay is intended as an aid in the diagnosis of influenza from Nasopharyngeal swab specimens and should not be used as a sole basis for treatment. Nasal washings and aspirates are unacceptable for Xpert Xpress SARS-CoV-2/FLU/RSV testing.  Fact Sheet for Patients: EntrepreneurPulse.com.au  Fact Sheet for Healthcare Providers: IncredibleEmployment.be  This test is not yet approved or cleared by the [place] FDA and has been authorized for detection and/or diagnosis of SARS-CoV-2 by FDA under an Emergency Use Authorization (EUA). This EUA will remain in effect (meaning this test can be used) for the duration of the COVID-19 declaration under Section 564(b)(1) of the Act, 21 U.S.C. section 360bbb-3(b)(1), unless the authorization  is terminated or revoked.  Performed at [facility], [address] [place], [place] [address]   MRSA Next Gen by PCR, Nasal     Status: None   Collection Time: 02/02/21  9:21 AM   Specimen: Nasal Mucosa; Nasal Swab  Result Value Ref Range Status    MRSA by PCR Next Gen NOT DETECTED NOT DETECTED Final    Comment: (NOTE) The GeneXpert MRSA Assay (FDA approved for NASAL specimens only), is one component of a comprehensive MRSA colonization surveillance program. It is not intended to diagnose MRSA infection nor to guide or monitor treatment for MRSA infections. Test performance is not FDA approved in patients less than 30 years old. Performed at [facility], [address] [place], [place] [address]   CULTURE, BLOOD (ROUTINE X 2) w Reflex to ID Panel     Status: None (Preliminary result)   Collection Time: 02/02/21  6:07 PM   Specimen: BLOOD  Result Value Ref Range Status   Specimen Description BLOOD BLOOD RIGHT HAND  Final   Special Requests   Final    BOTTLES DRAWN AEROBIC AND ANAEROBIC Blood Culture adequate volume   Culture   Final    NO GROWTH 3 DAYS Performed at [facility], [address], [place], [place] [address]    Report Status PENDING  Incomplete  CULTURE, BLOOD (ROUTINE X 2) w Reflex to ID Panel     Status: None (Preliminary result)   Collection Time: 02/02/21  6:07 PM   Specimen: BLOOD  Result Value Ref Range Status   Specimen Description BLOOD BLOOD RIGHT FOREARM  Final   Special Requests   Final    BOTTLES DRAWN AEROBIC AND ANAEROBIC Blood Culture adequate volume   Culture   Final    NO GROWTH 3 DAYS Performed at [facility], [address], [place], [place] [address]    Report Status PENDING  Incomplete   Studies/Results: DG C-Arm 1-60 Min-No Report  Result Date: 02/04/2021 Fluoroscopy was utilized by the requesting physician.  No radiographic interpretation.   Medications: I have reviewed the patient's current medications. Scheduled Meds:  sodium chloride   Intravenous Once   aspirin EC  81 mg Oral Daily   atorvastatin  40 mg Oral QHS   chlorhexidine  15 mL Mouth Rinse BID   Chlorhexidine Gluconate Cloth  6 each Topical Q0600   clotrimazole  1 application Topical  BID   diclofenac Sodium  2 g Topical q AM   epoetin (EPOGEN/PROCRIT) injection  10,000 Units Intravenous Q T,Th,Sa-HD   finasteride  5 mg Oral Daily   loratadine  10 mg Oral Once per day on Sun Mon Wed Fri   mouth rinse  15 mL Mouth Rinse q12n4p   midodrine  10 mg Oral TID WC   pantoprazole (PROTONIX) IV  40 mg Intravenous Q12H   pregabalin  75 mg Oral BID   rOPINIRole  1 mg Oral QHS   sevelamer carbonate  1,600 mg Oral 2 times per day on Tue Thu Sat   sevelamer carbonate  1,600 mg Oral 3 times per day on Sun Mon Wed Fri   vitamin B-12  1,000 mcg Oral Daily   Continuous Infusions:  sodium chloride     sodium chloride     amiodarone 30 mg/hr (02/05/21 0200)   piperacillin-tazobactam (ZOSYN)  IV 2.25 g (02/05/21 0523)   PRN Meds:.sodium chloride, sodium chloride, acetaminophen, alteplase, heparin, HYDROcodone-acetaminophen, lactulose, lidocaine (PF), lidocaine-prilocaine, loperamide, morphine injection, pentafluoroprop-tetrafluoroeth,  polyvinyl alcohol   Assessment: Principal Problem:   Abdominal pain Active Problems:   ESRD (end stage renal disease) (HCC)   Atrial fibrillation, chronic (HCC)   Wound of skin   Choledocholithiasis   Morbid obesity (HCC)   Normocytic anemia   Atrial fibrillation with rapid ventricular response ([facility])  [name] 75 y.o. male admitted with acute gallstone pancreatitis and choledocholithiasis possible a sending cholangitis.  Underwent ERCP on 02/04/2021, stone was not removed but a stent was placed.  Transaminases are improving.  Bilirubin level is down to 1.7 from a high of 4.2.  White cell count is 4.9 hemoglobin is stable at 9.6 g.No evidence of post ERCP pancreatitis.   Plan: 1.  Continue supportive care with fluids antibiotics 2.  ERCP in 1 month's time to have the stent removed and stone extracted 3.  Once stable will need cholecystectomy 4. Some concern for ?melena- hb stable - suggest to watch color of stools and when back to brown  can restart Eloquis. I do not suspect active bleed since Hb stable . Do not check stool occults   LOS: 5 days   Antonio Bellows, MD 02/05/2021, 10:06 AM

## 2021-02-05 NOTE — Progress Notes (Signed)
PROGRESS NOTE    AUGUST LONGEST  OJJ:[id number] DOB: 01-17-1946 DOA: 01/31/2021 PCP: Marsh Dolly, MD   Brief Narrative: Taken from prior notes. Antonio Phillips, 75 y.o. 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 fib on Eliquis, HLD, chronic hypotension on midodrine, morbid obesity who presented from nursing home with complaints of abdominal pain and emesis of 2-3 days duration.    Seen in the ED,cxr pulmonary edema,at least moderate pleural effusion with compressive atelectasis, CT chest abdomen pelvis showed acute pancreatitis, choledocholithiasis 1.4 cm calculus in the distal CBD with biliary duct dilatation suggesting obstruction .Clinically no concerns for acute cholangitis however.  ERCP was advised by Gastroenterology.  Initially canceled due to persistent hypotension, received 2 units of PRBC for hematochezia.  Holding Eliquis and Plavix. Transferred to stepdown due to worsening hypotension, map goal of 55.  Patient underwent successful ERCP with GI, found to have dilated bile duct with 1 stone, removal was not attempted but a stent was placed which needs to be removed in 1 month with another ERCP.  Patient is high risk for deterioration and death based on multiple life limiting comorbidities.  Palliative care was also consulted, initially patient would like to stay as full code but after another discussion today, CODE STATUS changed to DNR with full scope of medical care.  Subjective: Patient was seen and examined during dialysis session today.  Denies any abdominal pain.  Wants to go home.  He does use home oxygen as needed.  Assessment & Plan:   Principal Problem:   Abdominal pain Active Problems:   ESRD (end stage renal disease) (HCC)   Atrial fibrillation, chronic (HCC)   Wound of skin   Choledocholithiasis   Morbid obesity (HCC)   Normocytic anemia   Atrial fibrillation with rapid ventricular response  (HCC)  Acute on chronic anemia.  Most likely some GI loss.  Underwent ERCP with stent placement in common bile duct with GI.  Received 2 unit of PRBC.  PPI was initiated.  Hemoglobin improved to 10.1 after getting blood transfusions, currently seems stable with mild improvement to 9.6.  Might be some element of bone marrow suppression with recent infection.   -Continue to monitor -Transfuse if below 8  Acute biliary pancreatitis secondary to choledocholithiasis.  Lipase greater than 1000 on admission.  This was confirmed on CT scan.  Underwent successful ERCP with GI, found to have severely dilated common bile duct with a bile stone, no attempt was made for stone removal but a stent was placed which needs to be removed in 1 month with a second ERCP. He was placed on Zosyn due to worsening hypotension. Preliminary blood cultures negative in 24 hours. -Continue with Zosyn-can be discharged on Cipro and Flagyl to complete a 7-day course. -Monitor for any sign of perforation or worsening cholangitis.  Nursing concern of dark-colored bowel movement.  Hemoglobin currently stable around 9.6.  Can be due to some bleeding during procedure.  Melena or GI bleed less likely as there is some improvement in hemoglobin today. -Monitor for any melena or hematochezia -Monitor hemoglobin  Acute on chronic thrombocytopenia.  Baseline platelet count around 100.  Today at 42, started showing some improvement.   Hematology was consulted and they are recommending 1 unit of platelet before the ERCP and 1 during the procedure. -Recommending to proceed with platelet transfusion is started bleeding or if counts drop below 15. -Some concern of  peripheral destruction but they are not recommending steroid at this time, might need steroid and/or IVIG. -Appreciate hematology recommendations.  Acute on chronic hypotension.  Some worsening might be due to GI bleed.  On midodrine at baseline.  Goal MAP of 55. -Continue with  midodrine-dose was increased to 10 mg 3 times a day with improvement in blood pressure. -Continue to monitor  Permanent atrial fibrillation.  Cardiology is on board, he was placed on amiodarone. -Restart home dose of Eliquis -Continue to monitor  CAD with severe single-vessel disease.  Medical management per cardiology at this time. -Restarting home dose of Eliquis and Plavix. -Discontinue aspirin  Multiple decubitus ulcers.  Wound care was consulted -Continue with wound care  History of PAD.  S/p bilateral lower extremity amputations. -Continue with statin -Restart home dose of Eliquis and Plavix -Discontinue aspirin  Objective: Vitals:   02/05/21 1115 02/05/21 1130 02/05/21 1145 02/05/21 1200  BP: 93/62 110/65 108/74 93/63  Pulse:  (!) 49 (!) 38 90  Resp: 11 13 11 18   Temp:      TempSrc:      SpO2:  (!) 67% 98% 100%  Weight:    101.8 kg  Height:        Intake/Output Summary (Last 24 hours) at 02/05/2021 1322 Last data filed at 02/05/2021 1150 Gross per 24 hour  Intake 316.51 ml  Output 1000 ml  Net -683.49 ml    Filed Weights   02/04/21 1009 02/05/21 0755 02/05/21 1200  Weight: 100.2 kg 102.8 kg 101.8 kg    Examination:  General.  Chronically ill-appearing elderly man, in no acute distress. Pulmonary.  Lungs clear bilaterally, normal respiratory effort. CV.  Regular rate and rhythm, no JVD, rub or murmur. Abdomen.  Soft, nontender, nondistended, BS positive. CNS.  Alert and oriented .  No focal neurologic deficit. Extremities.  Right BKA, left AKA Psychiatry.  Judgment and insight appears normal.   DVT prophylaxis: Eliquis Code Status: DNR Family Communication:  Disposition Plan:  Status is: Inpatient  Remains inpatient appropriate because: Severity of illness   Level of care: Stepdown  All the records are reviewed and case discussed with Care Management/Social Worker. Management plans discussed with the patient, nursing and they are in  agreement.  Consultants:  PCCM Cardiology GI Nephrology  Procedures:  Antimicrobials:  Zosyn  Data Reviewed: I have personally reviewed following labs and imaging studies  CBC: Recent Labs  Lab 01/31/21 0909 02/01/21 0618 02/02/21 1807 02/03/21 0501 02/03/21 1302 02/04/21 0415 02/05/21 0416  WBC 7.4   < > 4.6 4.2 6.0 4.0 4.9  NEUTROABS 6.0  --  3.9  --  4.4  --   --   HGB 8.5*   < > 10.1* 9.1* 9.6* 9.1* 9.6*  HCT 26.9*   < > 31.4* 28.0* 30.2* 28.0* 29.8*  MCV 95.4   < > 94.0 93.3 94.7 94.3 94.9  PLT 62*   < > 41* 34* 57* 41* 51*   < > = values in this interval not displayed.    Basic Metabolic Panel: Recent Labs  Lab 02/01/21 0618 02/02/21 0030 02/02/21 0647 02/02/21 1807 02/03/21 0501  NA 136 133* 135 136 133*  K 5.8* 3.9 4.3 4.4 4.5  CL 100 96* 98 100 98  CO2 23 26 26 25 25   GLUCOSE 78 87 87 85 89  BUN 77* 36* 42* 47* 54*  CREATININE 8.12* 4.52* 4.75* 5.06* 5.56*  CALCIUM 8.4* 8.7* 8.4* 8.7* 8.1*  MG  --  1.4*  --   --  1.8  PHOS 8.0*  --   --   --   --     GFR: Estimated Creatinine Clearance: 13.7 mL/min (A) (by C-G formula based on SCr of 5.56 mg/dL (H)). Liver Function Tests: Recent Labs  Lab 02/01/21 0618 02/02/21 0647 02/02/21 1807 02/03/21 1302 02/05/21 0416  AST 51* 40 45* 34 15  ALT 42 32 38 30 18  ALKPHOS 332* 423* 457* 364* 268*  BILITOT 3.3* 3.6* 3.8* 2.6* 1.7*  PROT 5.2* 5.3* 5.5* 5.5* 5.2*  ALBUMIN 2.8* 2.8* 2.9* 2.7* 2.4*    Recent Labs  Lab 01/31/21 0909  LIPASE 1,021*    Recent Labs  Lab 02/02/21 0030  AMMONIA 29    Coagulation Profile: Recent Labs  Lab 02/02/21 0647  INR 1.9*    Cardiac Enzymes: No results for input(s): CKTOTAL, CKMB, CKMBINDEX, TROPONINI in the last 168 hours. BNP (last 3 results) No results for input(s): PROBNP in the last 8760 hours. HbA1C: No results for input(s): HGBA1C in the last 72 hours. CBG: Recent Labs  Lab 02/04/21 1930 02/04/21 2324 02/05/21 0315 02/05/21 0719  02/05/21 1111  GLUCAP 110* 107* 88 93 80    Lipid Profile: No results for input(s): CHOL, HDL, LDLCALC, TRIG, CHOLHDL, LDLDIRECT in the last 72 hours. Thyroid Function Tests: No results for input(s): TSH, T4TOTAL, FREET4, T3FREE, THYROIDAB in the last 72 hours. Anemia Panel: No results for input(s): VITAMINB12, FOLATE, FERRITIN, TIBC, IRON, RETICCTPCT in the last 72 hours.  Sepsis Labs: Recent Labs  Lab 02/02/21 1807 02/02/21 2135 02/03/21 0430 02/04/21 0413  PROCALCITON 21.06  --  17.01 9.28  LATICACIDVEN 1.7 2.1*  --   --      Recent Results (from the past 240 hour(s))  Resp Panel by RT-PCR (Flu A&B, Covid) Nasopharyngeal Swab     Status: None   Collection Time: 01/31/21  9:11 AM   Specimen: Nasopharyngeal Swab; Nasopharyngeal(NP) swabs in vial transport medium  Result Value Ref Range Status   SARS Coronavirus 2 by RT PCR NEGATIVE NEGATIVE Final    Comment: (NOTE) SARS-CoV-2 target nucleic acids are NOT DETECTED.  The SARS-CoV-2 RNA is generally detectable in upper respiratory specimens during the acute phase of infection. The lowest concentration of SARS-CoV-2 viral copies this assay can detect is 138 copies/mL. A negative result does not preclude SARS-Cov-2 infection and should not be used as the sole basis for treatment or other patient management decisions. A negative result may occur with  improper specimen collection/handling, submission of specimen other than nasopharyngeal swab, presence of viral mutation(s) within the areas targeted by this assay, and inadequate number of viral copies(<138 copies/mL). A negative result must be combined with clinical observations, patient history, and epidemiological information. The expected result is Negative.  Fact Sheet for Patients:  EntrepreneurPulse.com.au  Fact Sheet for Healthcare Providers:  IncredibleEmployment.be  This test is no t yet approved or cleared by the [place]  FDA and  has been authorized for detection and/or diagnosis of SARS-CoV-2 by FDA under an Emergency Use Authorization (EUA). This EUA will remain  in effect (meaning this test can be used) for the duration of the COVID-19 declaration under Section 564(b)(1) of the Act, 21 U.S.C.section 360bbb-3(b)(1), unless the authorization is terminated  or revoked sooner.       Influenza A by PCR NEGATIVE NEGATIVE Final   Influenza B by PCR NEGATIVE NEGATIVE Final    Comment: (NOTE) The Xpert Xpress SARS-CoV-2/FLU/RSV plus assay is intended as an aid in the diagnosis of  influenza from Nasopharyngeal swab specimens and should not be used as a sole basis for treatment. Nasal washings and aspirates are unacceptable for Xpert Xpress SARS-CoV-2/FLU/RSV testing.  Fact Sheet for Patients: EntrepreneurPulse.com.au  Fact Sheet for Healthcare Providers: IncredibleEmployment.be  This test is not yet approved or cleared by the [place] FDA and has been authorized for detection and/or diagnosis of SARS-CoV-2 by FDA under an Emergency Use Authorization (EUA). This EUA will remain in effect (meaning this test can be used) for the duration of the COVID-19 declaration under Section 564(b)(1) of the Act, 21 U.S.C. section 360bbb-3(b)(1), unless the authorization is terminated or revoked.  Performed at [facility], [address] [place], [place] [address]   MRSA Next Gen by PCR, Nasal     Status: None   Collection Time: 02/02/21  9:21 AM   Specimen: Nasal Mucosa; Nasal Swab  Result Value Ref Range Status   MRSA by PCR Next Gen NOT DETECTED NOT DETECTED Final    Comment: (NOTE) The GeneXpert MRSA Assay (FDA approved for NASAL specimens only), is one component of a comprehensive MRSA colonization surveillance program. It is not intended to diagnose MRSA infection nor to guide or monitor treatment for MRSA infections. Test performance is not FDA  approved in patients less than 16 years old. Performed at [facility], [address] [place], [place] [address]   CULTURE, BLOOD (ROUTINE X 2) w Reflex to ID Panel     Status: None (Preliminary result)   Collection Time: 02/02/21  6:07 PM   Specimen: BLOOD  Result Value Ref Range Status   Specimen Description BLOOD BLOOD RIGHT HAND  Final   Special Requests   Final    BOTTLES DRAWN AEROBIC AND ANAEROBIC Blood Culture adequate volume   Culture   Final    NO GROWTH 3 DAYS Performed at [facility], [address], [place], [place] [address]    Report Status PENDING  Incomplete  CULTURE, BLOOD (ROUTINE X 2) w Reflex to ID Panel     Status: None (Preliminary result)   Collection Time: 02/02/21  6:07 PM   Specimen: BLOOD  Result Value Ref Range Status   Specimen Description BLOOD BLOOD RIGHT FOREARM  Final   Special Requests   Final    BOTTLES DRAWN AEROBIC AND ANAEROBIC Blood Culture adequate volume   Culture   Final    NO GROWTH 3 DAYS Performed at [facility], [address] [place], [place] [address]    Report Status PENDING  Incomplete      Radiology Studies: DG C-Arm 1-60 Min-No Report  Result Date: 02/04/2021 Fluoroscopy was utilized by the requesting physician.  No radiographic interpretation.    Scheduled Meds:  sodium chloride   Intravenous Once   apixaban  5 mg Oral BID   atorvastatin  40 mg Oral QHS   chlorhexidine  15 mL Mouth Rinse BID   Chlorhexidine Gluconate Cloth  6 each Topical Q0600   [START ON 02/06/2021] clopidogrel  75 mg Oral Q breakfast   clotrimazole  1 application Topical BID   diclofenac Sodium  2 g Topical q AM   epoetin (EPOGEN/PROCRIT) injection  10,000 Units Intravenous Q T,Th,Sa-HD   finasteride  5 mg Oral Daily   loratadine  10 mg Oral Once per day on Sun Mon Wed Fri   mouth rinse  15 mL Mouth Rinse q12n4p   midodrine  10 mg Oral TID WC   pantoprazole (PROTONIX) IV  40 mg  Intravenous Q12H   pregabalin   75 mg Oral BID   rOPINIRole  1 mg Oral QHS   sevelamer carbonate  1,600 mg Oral 2 times per day on Tue Thu Sat   sevelamer carbonate  1,600 mg Oral 3 times per day on Sun Mon Wed Fri   vitamin B-12  1,000 mcg Oral Daily   Continuous Infusions:  sodium chloride     sodium chloride     amiodarone 30 mg/hr (02/05/21 0200)   piperacillin-tazobactam (ZOSYN)  IV 2.25 g (02/05/21 0523)     LOS: 5 days   Time spent: 43 minutes. More than 50% of the time was spent in counseling/coordination of care  Lorella Nimrod, MD [facility]  If 7PM-7AM, please contact night-coverage [email or web address]  02/05/2021, 1:22 PM   This record has been created using Systems analyst. Errors have been sought and corrected,but may not always be located. Such creation errors do not reflect on the standard of care.

## 2021-02-06 ENCOUNTER — Inpatient Hospital Stay: Payer: Medicare Other

## 2021-02-06 DIAGNOSIS — R1011 Right upper quadrant pain: Secondary | ICD-10-CM | POA: Diagnosis not present

## 2021-02-06 DIAGNOSIS — I4891 Unspecified atrial fibrillation: Secondary | ICD-10-CM | POA: Diagnosis not present

## 2021-02-06 LAB — CBC
HCT: 29.1 % — ABNORMAL LOW (ref 39.0–52.0)
Hemoglobin: 9.3 g/dL — ABNORMAL LOW (ref 13.0–17.0)
MCH: 29.7 pg (ref 26.0–34.0)
MCHC: 32 g/dL (ref 30.0–36.0)
MCV: 93 fL (ref 80.0–100.0)
Platelets: 44 10*3/uL — ABNORMAL LOW (ref 150–400)
RBC: 3.13 MIL/uL — ABNORMAL LOW (ref 4.22–5.81)
RDW: 17.2 % — ABNORMAL HIGH (ref 11.5–15.5)
WBC: 6.2 10*3/uL (ref 4.0–10.5)
nRBC: 0 % (ref 0.0–0.2)

## 2021-02-06 LAB — GLUCOSE, CAPILLARY
Glucose-Capillary: 84 mg/dL (ref 70–99)
Glucose-Capillary: 118 mg/dL — ABNORMAL HIGH (ref 70–99)
Glucose-Capillary: 128 mg/dL — ABNORMAL HIGH (ref 70–99)

## 2021-02-06 MED ORDER — PREGABALIN 50 MG PO CAPS
100.0000 mg | ORAL_CAPSULE | Freq: Two times a day (BID) | ORAL | Status: DC
  Administered 2021-02-06 – 2021-02-09 (×6): 100 mg via ORAL
  Filled 2021-02-06 (×6): qty 2

## 2021-02-06 MED ORDER — IPRATROPIUM-ALBUTEROL 0.5-2.5 (3) MG/3ML IN SOLN
3.0000 mL | Freq: Four times a day (QID) | RESPIRATORY_TRACT | Status: DC
  Administered 2021-02-06 (×2): 3 mL via RESPIRATORY_TRACT
  Filled 2021-02-06 (×2): qty 3

## 2021-02-06 MED ORDER — DM-GUAIFENESIN ER 30-600 MG PO TB12
1.0000 | ORAL_TABLET | Freq: Two times a day (BID) | ORAL | Status: DC
  Administered 2021-02-06 – 2021-02-09 (×7): 1 via ORAL
  Filled 2021-02-06 (×9): qty 1

## 2021-02-06 MED ORDER — IPRATROPIUM-ALBUTEROL 0.5-2.5 (3) MG/3ML IN SOLN: 3.0000 mL | Freq: Four times a day (QID) | RESPIRATORY_TRACT | Status: DC | PRN

## 2021-02-06 NOTE — Progress Notes (Signed)
Pt A&OX4 Denies abdomen pain. Face is symmetrical. RN made MD aware of night shift RN reporting blood in urine and low platelet count. Holding anticoagulants until MD to see pt. Pt has weakness in R hand and pain. RUE is warm and appropriate in color. Pt complains of stump pain but refuses voltaren gel application. Tylenol administered for pain. Swelling in scrotum but pt refused elevation. HR elevates at high as 120 but pt did work with OT recently. O2 sats mid 90's on rm air. Crackles auscultated in lungs and MD made aware. Pt does have a new productive cough. Report called to [facility]. Transported via bed.

## 2021-02-06 NOTE — Progress Notes (Signed)
PROGRESS NOTE    Antonio Phillips  TMH:[id number] DOB: 1945-10-28 DOA: 01/31/2021 PCP: Marsh Dolly, MD   Brief Narrative: Taken from prior notes. Antonio Phillips, 75 y.o. 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 fib on Eliquis, HLD, chronic hypotension on midodrine, morbid obesity who presented from nursing home with complaints of abdominal pain and emesis of 2-3 days duration.    Seen in the ED,cxr pulmonary edema,at least moderate pleural effusion with compressive atelectasis, CT chest abdomen pelvis showed acute pancreatitis, choledocholithiasis 1.4 cm calculus in the distal CBD with biliary duct dilatation suggesting obstruction .Clinically no concerns for acute cholangitis however.  ERCP was advised by Gastroenterology.  Initially canceled due to persistent hypotension, received 2 units of PRBC for hematochezia.  Holding Eliquis and Plavix. Transferred to stepdown due to worsening hypotension, map goal of 55.  Patient underwent successful ERCP with GI, found to have dilated bile duct with 1 stone, removal was not attempted but a stent was placed which needs to be removed in 1 month with another ERCP.  Patient is high risk for deterioration and death based on multiple life limiting comorbidities.  Palliative care was also consulted, initially patient would like to stay as full code but after another discussion today, CODE STATUS changed to DNR with full scope of medical care.  Subjective: Patient was complaining of right hand pain, stating that was the reason why he asked his facility to send him to the hospital.  Stating that its been going on for months, progressively worsening to the point that he cannot move his fingers without having some pain.  He was also complaining of burning pain in the left AKA stump.  Assessment & Plan:   Principal Problem:   Abdominal pain Active Problems:   ESRD (end stage renal disease)  (HCC)   Atrial fibrillation, chronic (HCC)   Wound of skin   Choledocholithiasis   Morbid obesity (HCC)   Normocytic anemia   Atrial fibrillation with rapid ventricular response (HCC)  Acute on chronic anemia.  Most likely some GI loss.  Underwent ERCP with stent placement in common bile duct with GI.  Received 2 unit of PRBC.  PPI was initiated.  Hemoglobin improved to 10.1 after getting blood transfusions, currently seems stable with hemoglobin of 9.3 today.  Might be some element of bone marrow suppression with recent infection.   -Continue to monitor -Transfuse if below 8  Acute biliary pancreatitis secondary to choledocholithiasis.  Lipase greater than 1000 on admission.  This was confirmed on CT scan.  Underwent successful ERCP with GI, found to have severely dilated common bile duct with a bile stone, no attempt was made for stone removal but a stent was placed which needs to be removed in 1 month with a second ERCP. He was placed on Zosyn due to worsening hypotension. Preliminary blood cultures negative. -Continue with Zosyn-can be discharged on Cipro and Flagyl to complete a 7-day course. -Monitor for any sign of perforation or worsening cholangitis.  Nursing concern of dark-colored bowel movement.  Hemoglobin currently stable around 9.3.  Can be due to some bleeding during procedure.  -Monitor for any melena or hematochezia -Monitor hemoglobin  Acute on chronic thrombocytopenia.  Baseline platelet count around 100.  Today at 73,   Hematology was consulted and they are recommending 1 unit of platelet before the ERCP and 1 during the procedure. -Recommending to proceed with platelet transfusion  is started bleeding or if counts drop below 15. -Some concern of peripheral destruction but they are not recommending steroid at this time, might need steroid and/or IVIG. -Appreciate hematology recommendations.  Acute on chronic hypotension.  Some worsening might be due to GI bleed.  On  midodrine at baseline.  Goal MAP of 55. -Continue with midodrine-dose was increased to 10 mg 3 times a day with improvement in blood pressure. -Continue to monitor  Permanent atrial fibrillation.  Cardiology is on board, he was placed on amiodarone.  Initially home dose of Eliquis was held and was restarted yesterday after ERCP and clearance from GI. -Continue home dose of Eliquis -Continue to monitor  Right hand pain.  Some edema with no erythema of right hand.  Right hand x-ray with advanced degenerative changes, no fractures.  Left stamped burning pain.  -Increase the dose of Lyrica from 75 to 100 twice daily.  CAD with severe single-vessel disease.  Medical management per cardiology at this time. -Restarting home dose of Eliquis and Plavix. -Discontinue aspirin  Multiple decubitus ulcers.  Wound care was consulted -Continue with wound care  History of PAD.  S/p bilateral lower extremity amputations. -Continue with statin -Restart home dose of Eliquis and Plavix -Discontinue aspirin  Objective: Vitals:   02/06/21 1431 02/06/21 1448 02/06/21 1504 02/06/21 1517  BP: 94/68 96/64 (!) 90/52 93/66  Pulse:      Resp: 18 18 18 18   Temp:      TempSrc:      SpO2:  93% 98%   Weight:      Height:        Intake/Output Summary (Last 24 hours) at 02/06/2021 1522 Last data filed at 02/06/2021 1330 Gross per 24 hour  Intake 1304.08 ml  Output 80 ml  Net 1224.08 ml    Filed Weights   02/05/21 0755 02/05/21 1200 02/06/21 1202  Weight: 102.8 kg 101.8 kg 111.4 kg    Examination:  General.  Chronically ill-appearing gentleman, in no acute distress. Pulmonary.  Mild scattered expiratory wheeze, normal respiratory effort. CV.  Regular rate and rhythm, no JVD, rub or murmur. Abdomen.  Soft, nontender, nondistended, BS positive. CNS.  Alert and oriented .  No focal neurologic deficit. Extremities.  No edema, no cyanosis, pulses intact and symmetrical. Psychiatry.  Judgment and  insight appears normal.   DVT prophylaxis: Eliquis Code Status: DNR Family Communication:  Disposition Plan:  Status is: Inpatient  Remains inpatient appropriate because: Severity of illness   Level of care: Progressive  All the records are reviewed and case discussed with Care Management/Social Worker. Management plans discussed with the patient, nursing and they are in agreement.  Consultants:  PCCM Cardiology GI Nephrology  Procedures:  Antimicrobials:  Zosyn  Data Reviewed: I have personally reviewed following labs and imaging studies  CBC: Recent Labs  Lab 01/31/21 0909 02/01/21 0618 02/02/21 1807 02/03/21 0501 02/03/21 1302 02/04/21 0415 02/05/21 0416 02/06/21 0437  WBC 7.4   < > 4.6 4.2 6.0 4.0 4.9 6.2  NEUTROABS 6.0  --  3.9  --  4.4  --   --   --   HGB 8.5*   < > 10.1* 9.1* 9.6* 9.1* 9.6* 9.3*  HCT 26.9*   < > 31.4* 28.0* 30.2* 28.0* 29.8* 29.1*  MCV 95.4   < > 94.0 93.3 94.7 94.3 94.9 93.0  PLT 62*   < > 41* 34* 57* 41* 51* 44*   < > = values in this interval not displayed.  Basic Metabolic Panel: Recent Labs  Lab 02/01/21 0618 02/02/21 0030 02/02/21 0647 02/02/21 1807 02/03/21 0501  NA 136 133* 135 136 133*  K 5.8* 3.9 4.3 4.4 4.5  CL 100 96* 98 100 98  CO2 23 26 26 25 25   GLUCOSE 78 87 87 85 89  BUN 77* 36* 42* 47* 54*  CREATININE 8.12* 4.52* 4.75* 5.06* 5.56*  CALCIUM 8.4* 8.7* 8.4* 8.7* 8.1*  MG  --  1.4*  --   --  1.8  PHOS 8.0*  --   --   --   --     GFR: Estimated Creatinine Clearance: 14.3 mL/min (A) (by C-G formula based on SCr of 5.56 mg/dL (H)). Liver Function Tests: Recent Labs  Lab 02/01/21 0618 02/02/21 0647 02/02/21 1807 02/03/21 1302 02/05/21 0416  AST 51* 40 45* 34 15  ALT 42 32 38 30 18  ALKPHOS 332* 423* 457* 364* 268*  BILITOT 3.3* 3.6* 3.8* 2.6* 1.7*  PROT 5.2* 5.3* 5.5* 5.5* 5.2*  ALBUMIN 2.8* 2.8* 2.9* 2.7* 2.4*    Recent Labs  Lab 01/31/21 0909  LIPASE 1,021*    Recent Labs  Lab  02/02/21 0030  AMMONIA 29    Coagulation Profile: Recent Labs  Lab 02/02/21 0647  INR 1.9*    Cardiac Enzymes: No results for input(s): CKTOTAL, CKMB, CKMBINDEX, TROPONINI in the last 168 hours. BNP (last 3 results) No results for input(s): PROBNP in the last 8760 hours. HbA1C: No results for input(s): HGBA1C in the last 72 hours. CBG: Recent Labs  Lab 02/05/21 1622 02/05/21 1936 02/05/21 2322 02/06/21 0736 02/06/21 1126  GLUCAP 114* 108* 99 84 118*    Lipid Profile: No results for input(s): CHOL, HDL, LDLCALC, TRIG, CHOLHDL, LDLDIRECT in the last 72 hours. Thyroid Function Tests: No results for input(s): TSH, T4TOTAL, FREET4, T3FREE, THYROIDAB in the last 72 hours. Anemia Panel: No results for input(s): VITAMINB12, FOLATE, FERRITIN, TIBC, IRON, RETICCTPCT in the last 72 hours.  Sepsis Labs: Recent Labs  Lab 02/02/21 1807 02/02/21 2135 02/03/21 0430 02/04/21 0413  PROCALCITON 21.06  --  17.01 9.28  LATICACIDVEN 1.7 2.1*  --   --      Recent Results (from the past 240 hour(s))  Resp Panel by RT-PCR (Flu A&B, Covid) Nasopharyngeal Swab     Status: None   Collection Time: 01/31/21  9:11 AM   Specimen: Nasopharyngeal Swab; Nasopharyngeal(NP) swabs in vial transport medium  Result Value Ref Range Status   SARS Coronavirus 2 by RT PCR NEGATIVE NEGATIVE Final    Comment: (NOTE) SARS-CoV-2 target nucleic acids are NOT DETECTED.  The SARS-CoV-2 RNA is generally detectable in upper respiratory specimens during the acute phase of infection. The lowest concentration of SARS-CoV-2 viral copies this assay can detect is 138 copies/mL. A negative result does not preclude SARS-Cov-2 infection and should not be used as the sole basis for treatment or other patient management decisions. A negative result may occur with  improper specimen collection/handling, submission of specimen other than nasopharyngeal swab, presence of viral mutation(s) within the areas targeted by  this assay, and inadequate number of viral copies(<138 copies/mL). A negative result must be combined with clinical observations, patient history, and epidemiological information. The expected result is Negative.  Fact Sheet for Patients:  EntrepreneurPulse.com.au  Fact Sheet for Healthcare Providers:  IncredibleEmployment.be  This test is no t yet approved or cleared by the [place] FDA and  has been authorized for detection and/or diagnosis of SARS-CoV-2 by FDA under  an Emergency Use Authorization (EUA). This EUA will remain  in effect (meaning this test can be used) for the duration of the COVID-19 declaration under Section 564(b)(1) of the Act, 21 U.S.C.section 360bbb-3(b)(1), unless the authorization is terminated  or revoked sooner.       Influenza A by PCR NEGATIVE NEGATIVE Final   Influenza B by PCR NEGATIVE NEGATIVE Final    Comment: (NOTE) The Xpert Xpress SARS-CoV-2/FLU/RSV plus assay is intended as an aid in the diagnosis of influenza from Nasopharyngeal swab specimens and should not be used as a sole basis for treatment. Nasal washings and aspirates are unacceptable for Xpert Xpress SARS-CoV-2/FLU/RSV testing.  Fact Sheet for Patients: EntrepreneurPulse.com.au  Fact Sheet for Healthcare Providers: IncredibleEmployment.be  This test is not yet approved or cleared by the [place] FDA and has been authorized for detection and/or diagnosis of SARS-CoV-2 by FDA under an Emergency Use Authorization (EUA). This EUA will remain in effect (meaning this test can be used) for the duration of the COVID-19 declaration under Section 564(b)(1) of the Act, 21 U.S.C. section 360bbb-3(b)(1), unless the authorization is terminated or revoked.  Performed at [facility], [address] [place], [place] [address]   MRSA Next Gen by PCR, Nasal     Status: None   Collection Time: 02/02/21   9:21 AM   Specimen: Nasal Mucosa; Nasal Swab  Result Value Ref Range Status   MRSA by PCR Next Gen NOT DETECTED NOT DETECTED Final    Comment: (NOTE) The GeneXpert MRSA Assay (FDA approved for NASAL specimens only), is one component of a comprehensive MRSA colonization surveillance program. It is not intended to diagnose MRSA infection nor to guide or monitor treatment for MRSA infections. Test performance is not FDA approved in patients less than 60 years old. Performed at [facility], [address] [place], [place] [address]   CULTURE, BLOOD (ROUTINE X 2) w Reflex to ID Panel     Status: None (Preliminary result)   Collection Time: 02/02/21  6:07 PM   Specimen: BLOOD  Result Value Ref Range Status   Specimen Description   Final    BLOOD BLOOD RIGHT HAND Performed at [facility], [address] [place], [place] [address]    Special Requests   Final    BOTTLES DRAWN AEROBIC AND ANAEROBIC Blood Culture adequate volume Performed at [facility], [address], [place], [place] [address]    Culture  Setup Time   Final    GRAM NEGATIVE RODS AEROBIC BOTTLE ONLY Organism ID to follow CRITICAL RESULT CALLED TO, READ BACK BY AND VERIFIED WITH: BRANDON BEERS 02/05/21 2049 MU Performed at [facility], [address], [place], [place] [address]    Culture   Final    Lonell Grandchild NEGATIVE RODS TOO YOUNG TO READ Performed at [facility], [place] [address] [place],  [address]    Report Status PENDING  Incomplete  CULTURE, BLOOD (ROUTINE X 2) w Reflex to ID Panel     Status: None (Preliminary result)   Collection Time: 02/02/21  6:07 PM   Specimen: BLOOD  Result Value Ref Range Status   Specimen Description BLOOD BLOOD RIGHT FOREARM  Final   Special Requests   Final    BOTTLES DRAWN AEROBIC AND ANAEROBIC Blood Culture adequate volume   Culture   Final    NO GROWTH 4 DAYS Performed at [facility], [address],  [place],  [address]  Report Status PENDING  Incomplete  Blood Culture ID Panel (Reflexed)     Status: Abnormal   Collection Time: 02/02/21  6:07 PM  Result Value Ref Range Status   Enterococcus faecalis NOT DETECTED NOT DETECTED Final   Enterococcus Faecium NOT DETECTED NOT DETECTED Final   Listeria monocytogenes NOT DETECTED NOT DETECTED Final   Staphylococcus species NOT DETECTED NOT DETECTED Final   Staphylococcus aureus (BCID) NOT DETECTED NOT DETECTED Final   Staphylococcus epidermidis NOT DETECTED NOT DETECTED Final   Staphylococcus lugdunensis NOT DETECTED NOT DETECTED Final   Streptococcus species NOT DETECTED NOT DETECTED Final   Streptococcus agalactiae NOT DETECTED NOT DETECTED Final   Streptococcus pneumoniae NOT DETECTED NOT DETECTED Final   Streptococcus pyogenes NOT DETECTED NOT DETECTED Final   A.calcoaceticus-baumannii NOT DETECTED NOT DETECTED Final   Bacteroides fragilis NOT DETECTED NOT DETECTED Final   Enterobacterales DETECTED (A) NOT DETECTED Final    Comment: Enterobacterales represent a large order of gram negative bacteria, not a single organism. CRITICAL RESULT CALLED TO, READ BACK BY AND VERIFIED WITH: BRANDOM BEERS @2049  ON 02/05/21 MU    Enterobacter cloacae complex NOT DETECTED NOT DETECTED Final   Escherichia coli DETECTED (A) NOT DETECTED Final    Comment: CRITICAL RESULT CALLED TO, READ BACK BY AND VERIFIED WITH: BRANDOM BEERS 02/05/21 2049 MU    Klebsiella aerogenes NOT DETECTED NOT DETECTED Final   Klebsiella oxytoca NOT DETECTED NOT DETECTED Final   Klebsiella pneumoniae NOT DETECTED NOT DETECTED Final   Proteus species NOT DETECTED NOT DETECTED Final   Salmonella species NOT DETECTED NOT DETECTED Final   Serratia marcescens NOT DETECTED NOT DETECTED Final   Haemophilus influenzae NOT DETECTED NOT DETECTED Final   Neisseria meningitidis NOT DETECTED NOT DETECTED Final   Pseudomonas aeruginosa NOT DETECTED NOT DETECTED Final    Stenotrophomonas maltophilia NOT DETECTED NOT DETECTED Final   Candida albicans NOT DETECTED NOT DETECTED Final   Candida auris NOT DETECTED NOT DETECTED Final   Candida glabrata NOT DETECTED NOT DETECTED Final   Candida krusei NOT DETECTED NOT DETECTED Final   Candida parapsilosis NOT DETECTED NOT DETECTED Final   Candida tropicalis NOT DETECTED NOT DETECTED Final   Cryptococcus neoformans/gattii NOT DETECTED NOT DETECTED Final   CTX-M ESBL NOT DETECTED NOT DETECTED Final   Carbapenem resistance IMP NOT DETECTED NOT DETECTED Final   Carbapenem resistance KPC NOT DETECTED NOT DETECTED Final   Carbapenem resistance NDM NOT DETECTED NOT DETECTED Final   Carbapenem resist OXA 48 LIKE NOT DETECTED NOT DETECTED Final   Carbapenem resistance VIM NOT DETECTED NOT DETECTED Final    Comment: Performed at [facility], [address], [place], [place] [address]      Radiology Studies: DG Chest 2 View  Result Date: 02/05/2021 CLINICAL DATA:  Pleuritic chest pain. EXAM: CHEST - 2 VIEW COMPARISON:  CT examination 01/31/2021 FINDINGS: Stable enlargement of the cardiopericardial silhouette. Blunting of the posterior costophrenic angles increased from 01/31/2021 suspicious for enlarging bilateral pleural effusions with associated passive atelectasis. Substantial thoracic spondylosis. Atherosclerotic calcification of the aortic arch. Clips in the right neck compatible with prior right thyroidectomy. IMPRESSION: 1. Small to moderate but enlarging bilateral pleural effusions with blunting of the posterior costophrenic angles and some fluid tracking along one of the major fissures. 2. Stable enlargement of the cardiopericardial silhouette, on recent CT this was due to a combination of both cardiomegaly and small pericardial effusion. 3. Aortic Atherosclerosis (ICD10-I70.0). Electronically Signed   By: Cindra Eves.D.  On: 02/05/2021 15:33   DG Hand Complete Right  Result Date:  02/06/2021 CLINICAL DATA:  RIGHT hand pain, no history of injury. EXAM: RIGHT HAND - COMPLETE 3+ VIEW COMPARISON:  Wrist evaluation of February 02, 2021. FINDINGS: Degenerative changes about the hand in the distal interphalangeal joints. Degenerative changes about the wrist as seen on prior wrist imaging. Vascular calcifications in the soft tissues. Suggestion of soft tissue swelling diffusely about the hand. Limited due to limited range of motion with respect to phalanges on the lateral projection. No signs of acute fracture or dislocation or acute bony abnormality within the limitations of the current study. IMPRESSION: Degenerative changes about the hand in distal interphalangeal joints. No acute fracture. Suggestion of soft tissue swelling diffusely about the hand. Correlate with any signs of infection. Mildly limited exam due to limited range of motion about the hand. Electronically Signed   By: Zetta Bills M.D.   On: 02/06/2021 13:36    Scheduled Meds:  sodium chloride   Intravenous Once   apixaban  5 mg Oral BID   atorvastatin  40 mg Oral QHS   chlorhexidine  15 mL Mouth Rinse BID   Chlorhexidine Gluconate Cloth  6 each Topical Q0600   clopidogrel  75 mg Oral Q breakfast   clotrimazole  1 application Topical BID   dextromethorphan-guaiFENesin  1 tablet Oral BID   diclofenac Sodium  2 g Topical q AM   epoetin (EPOGEN/PROCRIT) injection  10,000 Units Intravenous Q T,Th,Sa-HD   finasteride  5 mg Oral Daily   ipratropium-albuterol  3 mL Nebulization Q6H   loratadine  10 mg Oral Once per day on Sun Mon Wed Fri   mouth rinse  15 mL Mouth Rinse q12n4p   midodrine  10 mg Oral TID WC   pantoprazole (PROTONIX) IV  40 mg Intravenous Q12H   pregabalin  100 mg Oral BID   rOPINIRole  1 mg Oral QHS   sevelamer carbonate  1,600 mg Oral 2 times per day on Tue Thu Sat   sevelamer carbonate  1,600 mg Oral 3 times per day on Sun Mon Wed Fri   vitamin B-12  1,000 mcg Oral Daily   Continuous  Infusions:  sodium chloride     sodium chloride     amiodarone 30 mg/hr (02/06/21 1413)   piperacillin-tazobactam (ZOSYN)  IV 2.25 g (02/06/21 1428)     LOS: 6 days   Time spent: 42 minutes. More than 50% of the time was spent in counseling/coordination of care  Lorella Nimrod, MD [facility]  If 7PM-7AM, please contact night-coverage [email or web address]  02/06/2021, 3:22 PM   This record has been created using Systems analyst. Errors have been sought and corrected,but may not always be located. Such creation errors do not reflect on the standard of care.

## 2021-02-06 NOTE — Progress Notes (Signed)
MEWS NOTE    02/06/21 0938  Assess: MEWS Score  Temp 98 F (36.7 C)  BP 94/62  Pulse Rate (!) 108  ECG Heart Rate (!) 108  Resp 18  SpO2 96 %  O2 Device Room Air  Assess: MEWS Score  MEWS Temp 0  MEWS Systolic 1  MEWS Pulse 1  MEWS RR 0  MEWS LOC 0  MEWS Score 2  MEWS Score Color Yellow  Assess: if the MEWS score is Yellow or Red  Were vital signs taken at a resting state? Yes  Focused Assessment No change from prior assessment  Does the patient meet 2 or more of the SIRS criteria? No  Does the patient have a confirmed or suspected source of infection? Yes  Provider and Rapid Response Notified? Yes  Early Detection of Sepsis Score *See Row Information* Medium  MEWS guidelines implemented *See Row Information* Yes  Treat  MEWS Interventions Escalated (See documentation below)  Take Vital Signs  Increase Vital Sign Frequency  Yellow: Q 2hr X 2 then Q 4hr X 2, if remains yellow, continue Q 4hrs  Escalate  MEWS: Escalate Yellow: discuss with charge nurse/RN and consider discussing with provider and RRT  Notify: Charge Nurse/RN  Name of Charge Nurse/RN Notified Lysbeth Galas  Date Charge Nurse/RN Notified 02/06/21  Time Charge Nurse/RN Notified 1610  Notify: Provider  Notification Reason  (Yellow MEWS)  Document  Patient Outcome Stabilized after interventions  Progress note created (see row info) Yes  Assess: SIRS CRITERIA  SIRS Temperature  0  SIRS Pulse 1  SIRS Respirations  0  SIRS WBC 0  SIRS Score Sum  1

## 2021-02-06 NOTE — Progress Notes (Addendum)
Progress Note  Patient Name: Antonio Phillips Date of Encounter: 02/06/2021  Primary Cardiologist: Ida Rogue, MD  Subjective   Feels congested in his chest this AM.  Coughing - bringing something up but hasn't been spitting it out.  Feels a little more sob sitting in bed.  No chest pain/palps.  Inpatient Medications    Scheduled Meds:  sodium chloride   Intravenous Once   apixaban  5 mg Oral BID   atorvastatin  40 mg Oral QHS   chlorhexidine  15 mL Mouth Rinse BID   Chlorhexidine Gluconate Cloth  6 each Topical Q0600   clopidogrel  75 mg Oral Q breakfast   clotrimazole  1 application Topical BID   diclofenac Sodium  2 g Topical q AM   epoetin (EPOGEN/PROCRIT) injection  10,000 Units Intravenous Q T,Th,Sa-HD   finasteride  5 mg Oral Daily   loratadine  10 mg Oral Once per day on Sun Mon Wed Fri   mouth rinse  15 mL Mouth Rinse q12n4p   midodrine  10 mg Oral TID WC   pantoprazole (PROTONIX) IV  40 mg Intravenous Q12H   pregabalin  75 mg Oral BID   rOPINIRole  1 mg Oral QHS   sevelamer carbonate  1,600 mg Oral 2 times per day on Tue Thu Sat   sevelamer carbonate  1,600 mg Oral 3 times per day on Sun Mon Wed Fri   vitamin B-12  1,000 mcg Oral Daily   Continuous Infusions:  sodium chloride     sodium chloride     amiodarone 30 mg/hr (02/06/21 0000)   piperacillin-tazobactam (ZOSYN)  IV 2.25 g (02/06/21 0559)   PRN Meds: sodium chloride, sodium chloride, acetaminophen, alteplase, heparin, HYDROcodone-acetaminophen, lactulose, lidocaine (PF), lidocaine-prilocaine, loperamide, morphine injection, pentafluoroprop-tetrafluoroeth, polyvinyl alcohol   Vital Signs    Vitals:   02/05/21 1800 02/05/21 1900 02/05/21 2000 02/06/21 0400  BP: 91/62 (!) 82/61 (!) 88/60 93/66  Pulse: 61 (!) 103 85 (!) 107  Resp: 12 11 12 12   Temp:   98.5 F (36.9 C) 98.7 F (37.1 C)  TempSrc:   Oral Oral  SpO2: 100% 100% 100% 100%  Weight:      Height:        Intake/Output Summary  (Last 24 hours) at 02/06/2021 0752 Last data filed at 02/06/2021 0000 Gross per 24 hour  Intake 516.39 ml  Output 1080 ml  Net -563.61 ml   Filed Weights   02/04/21 1009 02/05/21 0755 02/05/21 1200  Weight: 100.2 kg 102.8 kg 101.8 kg    Physical Exam   GEN: obese, in no acute distress.  HEENT: Grossly normal.  Neck: Supple, obese, difficult to gauge JVP.  No carotid bruits, or masses. Cardiac: IR, IR distant, no murmurs, rubs, or gallops. No clubbing, cyanosis, edema.  Bilat BKA. Respiratory:  Respirations regular and unlabored, coarse breath sounds w/ scattered insp/exp wheezing throughout. GI: Obese, soft, nontender, nondistended, BS + x 4. MS: no deformity or atrophy. Skin: warm and dry, no rash. Neuro:  Strength and sensation are intact. Psych: AAOx3.  Normal affect.  Labs    Chemistry Recent Labs  Lab 02/02/21 0647 02/02/21 1807 02/03/21 0501 02/03/21 1302 02/05/21 0416  NA 135 136 133*  --   --   K 4.3 4.4 4.5  --   --   CL 98 100 98  --   --   CO2 26 25 25   --   --   GLUCOSE 87 85 89  --   --  BUN 42* 47* 54*  --   --   CREATININE 4.75* 5.06* 5.56*  --   --   CALCIUM 8.4* 8.7* 8.1*  --   --   PROT 5.3* 5.5*  --  5.5* 5.2*  ALBUMIN 2.8* 2.9*  --  2.7* 2.4*  AST 40 45*  --  34 15  ALT 32 38  --  30 18  ALKPHOS 423* 457*  --  364* 268*  BILITOT 3.6* 3.8*  --  2.6* 1.7*  GFRNONAA 12* 11* 10*  --   --   ANIONGAP 11 11 10   --   --      Hematology Recent Labs  Lab 02/04/21 0415 02/05/21 0416 02/06/21 0437  WBC 4.0 4.9 6.2  RBC 2.97* 3.14* 3.13*  HGB 9.1* 9.6* 9.3*  HCT 28.0* 29.8* 29.1*  MCV 94.3 94.9 93.0  MCH 30.6 30.6 29.7  MCHC 32.5 32.2 32.0  RDW 17.2* 17.3* 17.2*  PLT 41* 51* 44*    Cardiac Enzymes  Recent Labs  Lab 01/31/21 1338 02/02/21 1807 02/02/21 2135 02/05/21 0416 02/05/21 0845  TROPONINIHS 101* 356* 397* 147* 146*      BNP Recent Labs  Lab 01/31/21 0909  BNP 1,051.9*    Lipids  Lab Results  Component Value Date    CHOL 58 09/23/2020   HDL 23 (L) 09/23/2020   LDLCALC 22 09/23/2020   TRIG 63 09/23/2020   CHOLHDL 2.5 09/23/2020    HbA1c  Lab Results  Component Value Date   HGBA1C 5.5 11/10/2018    Radiology    DG Chest 2 View  Result Date: 02/05/2021 CLINICAL DATA:  Pleuritic chest pain. EXAM: CHEST - 2 VIEW COMPARISON:  CT examination 01/31/2021 FINDINGS: Stable enlargement of the cardiopericardial silhouette. Blunting of the posterior costophrenic angles increased from 01/31/2021 suspicious for enlarging bilateral pleural effusions with associated passive atelectasis. Substantial thoracic spondylosis. Atherosclerotic calcification of the aortic arch. Clips in the right neck compatible with prior right thyroidectomy. IMPRESSION: 1. Small to moderate but enlarging bilateral pleural effusions with blunting of the posterior costophrenic angles and some fluid tracking along one of the major fissures. 2. Stable enlargement of the cardiopericardial silhouette, on recent CT this was due to a combination of both cardiomegaly and small pericardial effusion. 3. Aortic Atherosclerosis (ICD10-I70.0). Electronically Signed   By: Van Clines M.D.   On: 02/05/2021 15:33   DG Wrist Complete Right  Result Date: 02/02/2021 CLINICAL DATA:  Right wrist pain. EXAM: RIGHT WRIST - COMPLETE 3+ VIEW COMPARISON:  None. FINDINGS: There is no acute fracture or dislocation. Degenerative changes with narrowing of the radiocarpal distance and subcortical cystic changes of the carpal bones. Vascular calcifications noted. The soft tissues are grossly unremarkable. IMPRESSION: 1. No acute fracture or dislocation. 2. Degenerative changes. Electronically Signed   By: Anner Crete M.D.   On: 02/02/2021 19:12   DG C-Arm 1-60 Min-No Report  Result Date: 02/04/2021 Fluoroscopy was utilized by the requesting physician.  No radiographic interpretation.    Telemetry    Afib 90's to low 100's - Personally Reviewed  Cardiac  Studies   Limited 2D echo with Definity 09/28/2020: 1. Left ventricular ejection fraction, by estimation, is 45 to 50%. The  left ventricle has mildly decreased function. Left ventricular endocardial  border not optimally defined to evaluate regional wall motion. There is  mild left ventricular hypertrophy.   2. Right ventricular systolic function is moderately reduced. The right  ventricular size is normal. Mildly increased right ventricular  wall  thickness. There is moderately elevated pulmonary artery systolic  pressure.   3. The mitral valve is abnormal. Trivial mitral valve regurgitation.   4. The aortic valve is tricuspid. There is mild thickening of the aortic  valve.   5. The inferior vena cava is dilated in size with <50% respiratory  variability, suggesting right atrial pressure of 15 mmHg.  __________   LHC 09/22/2020:   Ost LAD to Prox LAD lesion is 50% stenosed.   2nd Mrg lesion is 99% stenosed.   A drug-eluting stent was successfully placed using a STENT RESOLUTE ONYX 2.5X15.   Post intervention, there is a 30% residual stenosis.   1.  Severe single-vessel coronary artery disease with 99% subtotal stenosis of the second obtuse marginal branch, treated with a 2.5 x 15 mm resolute Onyx DES.  30% residual stenosis at the case completion due to resistant/calcified lesion type even with high-pressure noncompliant balloon angioplasty. 2.  Mild to moderate nonobstructive proximal LAD stenosis 3.  Mild nonobstructive RCA stenosis  Recommendations: Aggressive medical therapy, as long as no bleeding complications arise, would resume apixaban tomorrow, aspirin 81 mg daily x30 days, and clopidogrel 75 mg daily x minimum 6 months. ___________   GI Procedures   ERCP 12.1.2022 The major papilla was adjacent to a diverticulum. - The entire main bile duct was severely dilated. - Choledocholithiasis was found. Removal was not attempted; a stent was inserted. - One plastic stent was  placed into the common bile duct. Impression: - Return patient to ICU for ongoing care. - Clear liquid diet. - Watch for pancreatitis, bleeding, perforation, and cholangitis. - Repeat ERCP in 1 month to remove stent off anticoagulation   Patient Profile     75 y.o. 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  Assessment & Plan    1.  Gallstone pancreatitis:   s/p ERCP 12.1.2022 w/ stenting of CBD and plan for f/u ERCP and stent removal in a month.  GI following.    2.  CAD/Demand ischemia: s/p dES  OM2 in 09/2020.  No chest pain or dyspnea.  Mod HsT elevation in the setting of gallstone pancreatitis/sepsis/hypotension/ESRD w/ elevated creat.  Suspect demand ischemia. Now back on plavix - follow h/h.  Cont statin.  3.  Hypotension:  remains soft.  Cont midodrine.  4.  Permanent AFib:  trending 90's to low 100's this AM. Remains on IV amio in the setting of ? blocker held for hypotension. Eliquis resumed.  5.  ESRD:  per nephrology.  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  ? blocker on hold in setting of hypotension req midodrine.  Volume mgmt per neph/HD.  7.  Cough/congestion:  Productive cough this AM.  Coarse breath sounds and wheezing on exam.  CXR yesterday w/ small to moderate bilateral pleural effusions.  Afebrile/wbc nl.  Abx per IM in setting of bacteremia.  Volume mgmt per nephrology.  8.  Normocytic anemia/dark stools:  H/H stable.  Plavix/eliquis resumed -  follow.  9.  Bacteremia:  BC from 11/29 w/ Enterobacterales and E coli. Nontoxic.  Abx per IM.    Signed, Murray Hodgkins, NP  02/06/2021, 7:52 AM    For questions or updates, please contact   Please  consult [email or web address] for contact info under Cardiology/STEMI.      I have seen,  examined the patient, and reviewed the above assessment and plan.  Changes to above are made where necessary.  On exam, chronically ill,  alert, normal WOB, iRRR.  The patient appears stable from CV standpoint.  Hypotension limits diuresis.   On amiodarone current for rate control.  Cardiology to see again on Monday.   Co Sign: Thompson Grayer, MD 02/06/2021 1:10 PM

## 2021-02-06 NOTE — Evaluation (Signed)
Physical Therapy Evaluation Patient Details Name: Antonio Phillips MRN: [id number] DOB: 1945/09/07 Today's Date: 02/06/2021  History of Present Illness  Pt is a 75 y/o M admitted on 01/31/21 with c/c of abdominal pain & emesis x 2-3 days. CXR showed pulmonary edema,at least moderate pleural effusion with compressive atelectasis, CT chest abdomen pelvis showed acute pancreatitis, choledocholithiasis 1.4 cm calculus in the distal CBD with biliary duct dilatation suggesting obstruction. Pt underwent successful ERCP with GI, found to have dilated bile duct with 1 stone, removal was not attempted but a stent was placed which needs to be removed in 1 month with another ERCP. PMH: ESRD on HD TTS, PVD with R BKA & L AKA, DM2, permanent a-fib on eliquis, HLD, chronic hypotension on midodrine, morbid obesity  Clinical Impression  Pt seen as co-tx with OT for pt & therapists's safety. Pt reports prior to admission he was able to complete bed>w/c transfer without physical assistance but with hospital bed features & trapeze, although do question accuracy of this report.  On this date, pt requires +2 to attempt semi fowler to long sitting with inability to obtain position, mod assist +2 for rolling & total assist +2 for supine<>sit. Pt is able to scoot to [facility] with bed in trendelenburg position & use of bed rails with extra time. Pt c/o fatigue after mobility. Will continue to follow pt acutely to address activity tolerance, strength & increase independence with mobility.     Recommendations for follow up therapy are one component of a multi-disciplinary discharge planning process, led by the attending physician.  Recommendations may be updated based on patient status, additional functional criteria and insurance authorization.  Follow Up Recommendations Skilled nursing-short term rehab (<3 hours/day)    Assistance Recommended at Discharge Frequent or constant Supervision/Assistance  Functional Status Assessment  Patient has had a recent decline in their functional status and demonstrates the ability to make significant improvements in function in a reasonable and predictable amount of time.  Equipment Recommendations  None recommended by PT    Recommendations for Other Services       Precautions / Restrictions Precautions Precautions: Fall Restrictions Weight Bearing Restrictions: No Other Position/Activity Restrictions: old RLE BKA, L AKA      Mobility  Bed Mobility Overal bed mobility: Needs Assistance Bed Mobility: Rolling;Supine to Sit;Sit to Supine Rolling: Mod assist;+2 for physical assistance   Supine to sit: Total assist;+2 for physical assistance Sit to supine: Total assist;+2 for physical assistance   General bed mobility comments: also requires TOTAL +2 for sup to long sitting    Transfers                        Ambulation/Gait                  Stairs            Wheelchair Mobility    Modified Rankin (Stroke Patients Only)       Balance Overall balance assessment: Needs assistance   Sitting balance-Leahy Scale: Poor Sitting balance - Comments: UE support and MIN A initially, does demo some bouts of sustaining static sitting w/ close standby       Standing balance comment: pt unable to tolerate prosthetics at baseline, no longer stands                             Pertinent Vitals/Pain Pain Assessment: No/denies pain Faces Pain  Scale: Hurts even more Pain Location: scrotum with sup to sit and R hand Pain Descriptors / Indicators: Grimacing;Guarding Pain Intervention(s): Limited activity within patient's tolerance;Monitored during session (MD made aware, x-ray completed with no acute fx noted)    Home Living Family/patient expects to be discharged to:: Skilled nursing facility                   Additional Comments: Pt was resident at [facility]    Prior Function Prior Level of Function : Needs assist        Physical Assist : Mobility (physical);ADLs (physical) Mobility (physical): Transfers;Bed mobility ADLs (physical): Bathing;Dressing;Toileting;IADLs Mobility Comments: Pt reported he was able to transfer bed>motorized w/c without assistance with use of hospital bed features & trapeze but question accuracy of this. ADLs Comments: able to perform seated UB ADLs w/o assist, does require assist for bed level dressing/bathing/toileting. FAcility performs IADLs such as meal prep     Hand Dominance   Dominant Hand: Right    Extremity/Trunk Assessment   Upper Extremity Assessment Upper Extremity Assessment: Generalized weakness    Lower Extremity Assessment Lower Extremity Assessment: Generalized weakness       Communication   Communication: No difficulties  Cognition Arousal/Alertness: Awake/alert Behavior During Therapy: WFL for tasks assessed/performed Overall Cognitive Status: Within Functional Limits for tasks assessed                                 General Comments: increased processing time, stalls when it comes to mobilizing        General Comments      Exercises Other Exercises Other Exercises: OT engages pt in ed re: role of OT in acute setting, importance of OOB Activity, importance of strengthening. Pt with moderate reception.   Assessment/Plan    PT Assessment Patient needs continued PT services  PT Problem List Decreased strength;Decreased mobility;Decreased activity tolerance;Decreased balance;Pain;Cardiopulmonary status limiting activity       PT Treatment Interventions DME instruction;Therapeutic activities;Modalities;Therapeutic exercise;Patient/family education;Balance training;Wheelchair mobility training;Functional mobility training;Neuromuscular re-education;Manual techniques    PT Goals (Current goals can be found in the Care Plan section)  Acute Rehab PT Goals Patient Stated Goal: get better PT Goal Formulation: With patient Time  For Goal Achievement: 02/20/21 Potential to Achieve Goals: Fair    Frequency Min 2X/week   Barriers to discharge        Co-evaluation PT/OT/SLP Co-Evaluation/Treatment: Yes Reason for Co-Treatment: Necessary to address cognition/behavior during functional activity;For patient/therapist safety PT goals addressed during session: Balance;Mobility/safety with mobility         AM-PAC PT "6 Clicks" Mobility  Outcome Measure Help needed turning from your back to your side while in a flat bed without using bedrails?: Total Help needed moving from lying on your back to sitting on the side of a flat bed without using bedrails?: Total Help needed moving to and from a bed to a chair (including a wheelchair)?: Total Help needed standing up from a chair using your arms (e.g., wheelchair or bedside chair)?: Total Help needed to walk in hospital room?: Total Help needed climbing 3-5 steps with a railing? : Total 6 Click Score: 6    End of Session   Activity Tolerance: Patient limited by fatigue;Patient tolerated treatment well Patient left: in bed;with nursing/sitter in room Nurse Communication: Mobility status PT Visit Diagnosis: Muscle weakness (generalized) (M62.81)    Time: 7824-2353 PT Time Calculation (min) (ACUTE ONLY): 24 min  Charges:   PT Evaluation $PT Eval Moderate Complexity: Lloyd, DPT 02/06/21, 2:41 PM   Waunita Schooner 02/06/2021, 2:38 PM

## 2021-02-06 NOTE — Progress Notes (Signed)
[facility]  ROUNDING NOTE   Subjective:   Antonio Phillips is a 75 year old male with past medical history including hypotension, obesity, peripheral artery disease, diabetes, hyperlipidemia, and end-stage renal disease on dialysis.  Patient presents to emergency department with abdominal pain persisting for 2 days.  Patient admitted for further evaluation for Choledocholithiasis [K80.50] Pain [R52]  Patient is known to our practice for previous admissions and receives outpatient dialysis at [facility], supervised by Dr. Candiss Norse.  He receives dialysis on a Tuesday Thursday Saturday schedule.      Patient was seen today while receiving hemodialysis treatment Patient offers no new specific physical complaints. Patient is being dialyzed in his room as patient is on amiodarone drip    Objective:  Vital signs in last 24 hours:  Temp:  [98 F (36.7 C)-98.7 F (37.1 C)] 98 F (36.7 C) (12/03 0938) Pulse Rate:  [38-116] 108 (12/03 0938) Resp:  [11-21] 18 (12/03 0938) BP: (82-130)/(57-90) 94/62 (12/03 0938) SpO2:  [67 %-100 %] 96 % (12/03 0938) Weight:  [101.8 kg] 101.8 kg (12/02 1200)  Weight change: 2.61 kg Filed Weights   02/04/21 1009 02/05/21 0755 02/05/21 1200  Weight: 100.2 kg 102.8 kg 101.8 kg    Intake/Output: I/O last 3 completed shifts: In: 832.9 [I.V.:582.9; IV Piggyback:250] Out: 1080 [Urine:80; Other:1000]   Intake/Output this shift:  Total I/O In: 187.7 [I.V.:137.7; IV Piggyback:50] Out: 0   Physical Exam: General: NAD, resting comfortably  Head: Normocephalic, atraumatic. Moist oral mucosal membranes  Eyes: Anicteric  Lungs:  Clear to auscultation, normal effort  Heart: Irregular  Abdomen:  Soft, tender, mild distention  Extremities: No peripheral edema.  Right BKA left AKA  Neurologic: Nonfocal, moving all four extremities  Skin: No lesions  Access: Left AVF    Basic Metabolic Panel: Recent Labs  Lab 02/01/21 0618  02/02/21 0030 02/02/21 0647 02/02/21 1807 02/03/21 0501  NA 136 133* 135 136 133*  K 5.8* 3.9 4.3 4.4 4.5  CL 100 96* 98 100 98  CO2 23 26 26 25 25   GLUCOSE 78 87 87 85 89  BUN 77* 36* 42* 47* 54*  CREATININE 8.12* 4.52* 4.75* 5.06* 5.56*  CALCIUM 8.4* 8.7* 8.4* 8.7* 8.1*  MG  --  1.4*  --   --  1.8  PHOS 8.0*  --   --   --   --     Liver Function Tests: Recent Labs  Lab 02/01/21 0618 02/02/21 0647 02/02/21 1807 02/03/21 1302 02/05/21 0416  AST 51* 40 45* 34 15  ALT 42 32 38 30 18  ALKPHOS 332* 423* 457* 364* 268*  BILITOT 3.3* 3.6* 3.8* 2.6* 1.7*  PROT 5.2* 5.3* 5.5* 5.5* 5.2*  ALBUMIN 2.8* 2.8* 2.9* 2.7* 2.4*   Recent Labs  Lab 01/31/21 0909  LIPASE 1,021*   Recent Labs  Lab 02/02/21 0030  AMMONIA 29    CBC: Recent Labs  Lab 01/31/21 0909 02/01/21 0618 02/02/21 1807 02/03/21 0501 02/03/21 1302 02/04/21 0415 02/05/21 0416 02/06/21 0437  WBC 7.4   < > 4.6 4.2 6.0 4.0 4.9 6.2  NEUTROABS 6.0  --  3.9  --  4.4  --   --   --   HGB 8.5*   < > 10.1* 9.1* 9.6* 9.1* 9.6* 9.3*  HCT 26.9*   < > 31.4* 28.0* 30.2* 28.0* 29.8* 29.1*  MCV 95.4   < > 94.0 93.3 94.7 94.3 94.9 93.0  PLT 62*   < > 41* 34* 57* 41*  51* 44*   < > = values in this interval not displayed.    Cardiac Enzymes: No results for input(s): CKTOTAL, CKMB, CKMBINDEX, TROPONINI in the last 168 hours.  BNP: Invalid input(s): POCBNP  CBG: Recent Labs  Lab 02/05/21 1111 02/05/21 1622 02/05/21 1936 02/05/21 2322 02/06/21 0736  GLUCAP 80 114* 108* 99 84    Microbiology: Results for orders placed or performed during the hospital encounter of 01/31/21  Resp Panel by RT-PCR (Flu A&B, Covid) Nasopharyngeal Swab     Status: None   Collection Time: 01/31/21  9:11 AM   Specimen: Nasopharyngeal Swab; Nasopharyngeal(NP) swabs in vial transport medium  Result Value Ref Range Status   SARS Coronavirus 2 by RT PCR NEGATIVE NEGATIVE Final    Comment: (NOTE) SARS-CoV-2 target nucleic acids are NOT  DETECTED.  The SARS-CoV-2 RNA is generally detectable in upper respiratory specimens during the acute phase of infection. The lowest concentration of SARS-CoV-2 viral copies this assay can detect is 138 copies/mL. A negative result does not preclude SARS-Cov-2 infection and should not be used as the sole basis for treatment or other patient management decisions. A negative result may occur with  improper specimen collection/handling, submission of specimen other than nasopharyngeal swab, presence of viral mutation(s) within the areas targeted by this assay, and inadequate number of viral copies(<138 copies/mL). A negative result must be combined with clinical observations, patient history, and epidemiological information. The expected result is Negative.  Fact Sheet for Patients:  EntrepreneurPulse.com.au  Fact Sheet for Healthcare Providers:  IncredibleEmployment.be  This test is no t yet approved or cleared by the [place] FDA and  has been authorized for detection and/or diagnosis of SARS-CoV-2 by FDA under an Emergency Use Authorization (EUA). This EUA will remain  in effect (meaning this test can be used) for the duration of the COVID-19 declaration under Section 564(b)(1) of the Act, 21 U.S.C.section 360bbb-3(b)(1), unless the authorization is terminated  or revoked sooner.       Influenza A by PCR NEGATIVE NEGATIVE Final   Influenza B by PCR NEGATIVE NEGATIVE Final    Comment: (NOTE) The Xpert Xpress SARS-CoV-2/FLU/RSV plus assay is intended as an aid in the diagnosis of influenza from Nasopharyngeal swab specimens and should not be used as a sole basis for treatment. Nasal washings and aspirates are unacceptable for Xpert Xpress SARS-CoV-2/FLU/RSV testing.  Fact Sheet for Patients: EntrepreneurPulse.com.au  Fact Sheet for Healthcare Providers: IncredibleEmployment.be  This test is not yet  approved or cleared by the [place] FDA and has been authorized for detection and/or diagnosis of SARS-CoV-2 by FDA under an Emergency Use Authorization (EUA). This EUA will remain in effect (meaning this test can be used) for the duration of the COVID-19 declaration under Section 564(b)(1) of the Act, 21 U.S.C. section 360bbb-3(b)(1), unless the authorization is terminated or revoked.  Performed at [facility], [address] [place], [place] [address]   MRSA Next Gen by PCR, Nasal     Status: None   Collection Time: 02/02/21  9:21 AM   Specimen: Nasal Mucosa; Nasal Swab  Result Value Ref Range Status   MRSA by PCR Next Gen NOT DETECTED NOT DETECTED Final    Comment: (NOTE) The GeneXpert MRSA Assay (FDA approved for NASAL specimens only), is one component of a comprehensive MRSA colonization surveillance program. It is not intended to diagnose MRSA infection nor to guide or monitor treatment for MRSA infections. Test performance is not FDA approved in patients less than 2  years old. Performed at [facility], [address] [place], [place] [address]   CULTURE, BLOOD (ROUTINE X 2) w Reflex to ID Panel     Status: None (Preliminary result)   Collection Time: 02/02/21  6:07 PM   Specimen: BLOOD  Result Value Ref Range Status   Specimen Description   Final    BLOOD BLOOD RIGHT HAND Performed at [facility], [address], [place], [place] [address]    Special Requests   Final    BOTTLES DRAWN AEROBIC AND ANAEROBIC Blood Culture adequate volume Performed at [facility], [address] [place], [place] [address]    Culture  Setup Time   Final    GRAM NEGATIVE RODS AEROBIC BOTTLE ONLY Organism ID to follow CRITICAL RESULT CALLED TO, READ BACK BY AND VERIFIED WITH: BRANDON BEERS 02/05/21 2049 MU Performed at [facility], [address] [place], [place] [address]    Culture   Final    Lonell Grandchild NEGATIVE RODS TOO YOUNG  TO READ Performed at [facility], [address] [place], [place] [address]    Report Status PENDING  Incomplete  CULTURE, BLOOD (ROUTINE X 2) w Reflex to ID Panel     Status: None (Preliminary result)   Collection Time: 02/02/21  6:07 PM   Specimen: BLOOD  Result Value Ref Range Status   Specimen Description BLOOD BLOOD RIGHT FOREARM  Final   Special Requests   Final    BOTTLES DRAWN AEROBIC AND ANAEROBIC Blood Culture adequate volume   Culture   Final    NO GROWTH 4 DAYS Performed at [facility], [address] [place], [place] [address]    Report Status PENDING  Incomplete  Blood Culture ID Panel (Reflexed)     Status: Abnormal   Collection Time: 02/02/21  6:07 PM  Result Value Ref Range Status   Enterococcus faecalis NOT DETECTED NOT DETECTED Final   Enterococcus Faecium NOT DETECTED NOT DETECTED Final   Listeria monocytogenes NOT DETECTED NOT DETECTED Final   Staphylococcus species NOT DETECTED NOT DETECTED Final   Staphylococcus aureus (BCID) NOT DETECTED NOT DETECTED Final   Staphylococcus epidermidis NOT DETECTED NOT DETECTED Final   Staphylococcus lugdunensis NOT DETECTED NOT DETECTED Final   Streptococcus species NOT DETECTED NOT DETECTED Final   Streptococcus agalactiae NOT DETECTED NOT DETECTED Final   Streptococcus pneumoniae NOT DETECTED NOT DETECTED Final   Streptococcus pyogenes NOT DETECTED NOT DETECTED Final   A.calcoaceticus-baumannii NOT DETECTED NOT DETECTED Final   Bacteroides fragilis NOT DETECTED NOT DETECTED Final   Enterobacterales DETECTED (A) NOT DETECTED Final    Comment: Enterobacterales represent a large order of gram negative bacteria, not a single organism. CRITICAL RESULT CALLED TO, READ BACK BY AND VERIFIED WITH: BRANDOM BEERS @2049  ON 02/05/21 MU    Enterobacter cloacae complex NOT DETECTED NOT DETECTED Final   Escherichia coli DETECTED (A) NOT DETECTED Final    Comment: CRITICAL RESULT CALLED TO, READ BACK BY AND VERIFIED  WITH: BRANDOM BEERS 02/05/21 2049 MU    Klebsiella aerogenes NOT DETECTED NOT DETECTED Final   Klebsiella oxytoca NOT DETECTED NOT DETECTED Final   Klebsiella pneumoniae NOT DETECTED NOT DETECTED Final   Proteus species NOT DETECTED NOT DETECTED Final   Salmonella species NOT DETECTED NOT DETECTED Final   Serratia marcescens NOT DETECTED NOT DETECTED Final   Haemophilus influenzae NOT DETECTED NOT DETECTED Final   Neisseria meningitidis NOT DETECTED NOT DETECTED Final   Pseudomonas aeruginosa NOT  DETECTED NOT DETECTED Final   Stenotrophomonas maltophilia NOT DETECTED NOT DETECTED Final   Candida albicans NOT DETECTED NOT DETECTED Final   Candida auris NOT DETECTED NOT DETECTED Final   Candida glabrata NOT DETECTED NOT DETECTED Final   Candida krusei NOT DETECTED NOT DETECTED Final   Candida parapsilosis NOT DETECTED NOT DETECTED Final   Candida tropicalis NOT DETECTED NOT DETECTED Final   Cryptococcus neoformans/gattii NOT DETECTED NOT DETECTED Final   CTX-M ESBL NOT DETECTED NOT DETECTED Final   Carbapenem resistance IMP NOT DETECTED NOT DETECTED Final   Carbapenem resistance KPC NOT DETECTED NOT DETECTED Final   Carbapenem resistance NDM NOT DETECTED NOT DETECTED Final   Carbapenem resist OXA 48 LIKE NOT DETECTED NOT DETECTED Final   Carbapenem resistance VIM NOT DETECTED NOT DETECTED Final    Comment: Performed at [facility], [address] [place], [place] [address]    Coagulation Studies: No results for input(s): LABPROT, INR in the last 72 hours.   Urinalysis: No results for input(s): COLORURINE, LABSPEC, PHURINE, GLUCOSEU, HGBUR, BILIRUBINUR, KETONESUR, PROTEINUR, UROBILINOGEN, NITRITE, LEUKOCYTESUR in the last 72 hours.  Invalid input(s): APPERANCEUR    Imaging: DG Chest 2 View  Result Date: 02/05/2021 CLINICAL DATA:  Pleuritic chest pain. EXAM: CHEST - 2 VIEW COMPARISON:  CT examination 01/31/2021 FINDINGS: Stable enlargement of the cardiopericardial  silhouette. Blunting of the posterior costophrenic angles increased from 01/31/2021 suspicious for enlarging bilateral pleural effusions with associated passive atelectasis. Substantial thoracic spondylosis. Atherosclerotic calcification of the aortic arch. Clips in the right neck compatible with prior right thyroidectomy. IMPRESSION: 1. Small to moderate but enlarging bilateral pleural effusions with blunting of the posterior costophrenic angles and some fluid tracking along one of the major fissures. 2. Stable enlargement of the cardiopericardial silhouette, on recent CT this was due to a combination of both cardiomegaly and small pericardial effusion. 3. Aortic Atherosclerosis (ICD10-I70.0). Electronically Signed   By: Van Clines M.D.   On: 02/05/2021 15:33   DG C-Arm 1-60 Min-No Report  Result Date: 02/04/2021 Fluoroscopy was utilized by the requesting physician.  No radiographic interpretation.     Medications:    sodium chloride     sodium chloride     amiodarone 30 mg/hr (02/06/21 0816)   piperacillin-tazobactam (ZOSYN)  IV Stopped (02/06/21 0631)    sodium chloride   Intravenous Once   apixaban  5 mg Oral BID   atorvastatin  40 mg Oral QHS   chlorhexidine  15 mL Mouth Rinse BID   Chlorhexidine Gluconate Cloth  6 each Topical Q0600   clopidogrel  75 mg Oral Q breakfast   clotrimazole  1 application Topical BID   diclofenac Sodium  2 g Topical q AM   epoetin (EPOGEN/PROCRIT) injection  10,000 Units Intravenous Q T,Th,Sa-HD   finasteride  5 mg Oral Daily   loratadine  10 mg Oral Once per day on Sun Mon Wed Fri   mouth rinse  15 mL Mouth Rinse q12n4p   midodrine  10 mg Oral TID WC   pantoprazole (PROTONIX) IV  40 mg Intravenous Q12H   pregabalin  75 mg Oral BID   rOPINIRole  1 mg Oral QHS   sevelamer carbonate  1,600 mg Oral 2 times per day on Tue Thu Sat   sevelamer carbonate  1,600 mg Oral 3 times per day on Sun Mon Wed Fri   vitamin B-12  1,000 mcg Oral Daily      Assessment/ Plan:  Antonio Phillips is a  75 y.o.  male with past medical history including hypotension, obesity, peripheral artery disease, diabetes, hyperlipidemia, and end-stage renal disease on dialysis.  Patient presents to emergency department with abdominal pain persisting for 2 days.  Patient admitted for further evaluation for Choledocholithiasis [K80.50] Pain [R52]  [facility]/TTS/Lt AVF   Renal Patient is on end-stage disease. Patient is on hemodialysis Patient is on Tuesday Thursday Saturday schedule. Patient did receive dialysis on Friday as he did not go for his dialysis on his regular scheduled time on Thursday. Patient is being  dialyzed today as well.  2. Anemia of chronic kidney disease Lab Results  Component Value Date   HGB 9.3 (L) 02/06/2021    Hemoglobin is at goal-9-11 Will continue to maintain patient on Epogen.  3. Secondary Hyperparathyroidism: Lab Results  Component Value Date   CALCIUM 8.1 (L) 02/03/2021   PHOS 8.0 (H) 02/01/2021  Continue Renvela and periodic monitoring of bone mineral metabolism parameters.   4.Acute biliary pancreatitis secondary to choledocholithiasis Patient underwent ERCP. Patient is now s/p stent placement Patient is being followed by primary and GI   5. Hypotension    On Midodrine    LOS: 6 Hurshell Dino s [facility] 12/3/20229:49 AM

## 2021-02-06 NOTE — Evaluation (Signed)
Occupational Therapy Evaluation Patient Details Name: Antonio Phillips MRN: [id number] DOB: [birth year] Today's Date: 02/06/2021   History of Present Illness Pt is a 75 y/o M with PMH: ESRD on HD, HTN, HLD, R BKA/L AKA, T2DM, Afib on Eliquis, and chronic hypotension on midodrine. Pt presents to [facility] from [facility] d/t abdominal pain and emesis x2-3 days. W/u included: cxr pulmonary edema, at least moderate pleural effusion with compressive atelectasis, CT chest abdomen pelvis showed acute pancreatitis 2/2 choledocholithiasis. ERCP attempt was unsuccessful 2/2 hypotension. Pt with bile duct stent placed that requires removal in 1 month per attending MD note.   Clinical Impression   Pt seen for OT evaluation this date in setting of acute hospitalization d/t pancreatitis. Pt presents this date with c/o R hand pain and scrotal swelling/pain. Pt reports being able to sit himself on side of bed and transferring to w/c w/o assistance at LTC facility at baseline. He presents this date with decreased activity tolerance and requires increased assistance with bed mobility and transfers than baseline. That said, he gives meandering responses regarding how he usually moves, making it difficult to discern a true baseline. He presents this date requiring: requires SETUP for seated UB ADLs, MAX A for bed level LB ADLs. TOTAL A +2 for sup to long sitting and sup to EOB sitting (note, c/o scrotal pain and swelling which could be contributing). Pt transferred to hospital bed in preparation to move to the floor from ICU with MAX A +2 using rolling technique. He is left with all needs met and in reach and RN/CNA aware of session contents. VS stable throughout, HR was noted to reach 124 with mobilization, likely d/t being effortful. Anticipate if pt was truly able to complete transfers w/o assistance prior to this hospital stay, that he will require rehabilitation services f/u in his facility/STR services.        Recommendations for follow up therapy are one component of a multi-disciplinary discharge planning process, led by the attending physician.  Recommendations may be updated based on patient status, additional functional criteria and insurance authorization.   Follow Up Recommendations  Skilled nursing-short term rehab (<3 hours/day)    Assistance Recommended at Discharge Frequent or constant Supervision/Assistance  Functional Status Assessment  Patient has had a recent decline in their functional status and demonstrates the ability to make significant improvements in function in a reasonable and predictable amount of time.  Equipment Recommendations  Other (comment) (defer)    Recommendations for Other Services       Precautions / Restrictions Precautions Precautions: Fall Restrictions Weight Bearing Restrictions: No Other Position/Activity Restrictions: R BKA/L AKA      Mobility Bed Mobility Overal bed mobility: Needs Assistance Bed Mobility: Rolling;Supine to Sit;Sit to Supine Rolling: Max assist;+2 for physical assistance   Supine to sit: Total assist;+2 for physical assistance Sit to supine: Total assist;+2 for physical assistance   General bed mobility comments: also requires TOTAL +2 for sup to long sitting    Transfers                          Balance Overall balance assessment: Needs assistance   Sitting balance-Leahy Scale: Poor Sitting balance - Comments: UE support and MIN A initially, does demo some bouts of sustaining static sitting w/ close standby       Standing balance comment: pt unable to tolerate prosthetics at baseline, no longer stands  ADL either performed or assessed with clinical judgement   ADL                                         General ADL Comments: requires SETUP for seated UB ADLs, MAX A for bed level LB ADLs. TOTAL A +2 for sup to long sitting and sup to EOB  sitting. Pt states he is able to scoot transfer to w/c at baseline, but is unable to demo scooting today even with TOTAL A +2 (note, c/o scrotal pain and swelling which could be contributing)     Vision Patient Visual Report: No change from baseline       Perception     Praxis      Pertinent Vitals/Pain Pain Assessment: Faces Faces Pain Scale: Hurts even more Pain Location: scrotum with sup to sit and R hand Pain Descriptors / Indicators: Grimacing;Guarding Pain Intervention(s): Limited activity within patient's tolerance;Monitored during session     Hand Dominance Right   Extremity/Trunk Assessment Upper Extremity Assessment Upper Extremity Assessment: Generalized weakness   Lower Extremity Assessment Lower Extremity Assessment: Generalized weakness (h/o  R BKA/L AKA)       Communication Communication Communication: No difficulties   Cognition Arousal/Alertness: Awake/alert Behavior During Therapy: WFL for tasks assessed/performed Overall Cognitive Status: Within Functional Limits for tasks assessed                                 General Comments: increased processing time, stalls when it comes to mobilizing     General Comments       Exercises Other Exercises Other Exercises: OT engages pt in ed re: role of OT in acute setting, importance of OOB Activity, importance of strengthening. Pt with moderate reception.   Shoulder Instructions      Home Living Family/patient expects to be discharged to:: Skilled nursing facility ([facility])                                        Prior Functioning/Environment Prior Level of Function : Needs assist       Physical Assist : Mobility (physical);ADLs (physical) Mobility (physical): Transfers;Bed mobility ADLs (physical): Bathing;Dressing;Toileting;IADLs Mobility Comments: Pt with somewhat convoluted explanation of whether or not he requires assist with transfer to wheelchair, Seems  it waxes and wanes whether he can scoot himself or not ADLs Comments: able to perform seated UB ADLs w/o assist, does require assist for bed level dressing/bathing/toileting. FAcility performs IADLs such as meal prep        OT Problem List: Decreased strength;Decreased activity tolerance;Impaired balance (sitting and/or standing);Cardiopulmonary status limiting activity;Obesity;Increased edema;Pain      OT Treatment/Interventions:      OT Goals(Current goals can be found in the care plan section) Acute Rehab OT Goals Patient Stated Goal: to go back to [facility] OT Goal Formulation: With patient Time For Goal Achievement: 02/20/21 Potential to Achieve Goals: Good  OT Frequency: Min 2X/week   Barriers to D/C:            Co-evaluation              AM-PAC OT "6 Clicks" Daily Activity     Outcome Measure Help from another person eating meals?:  None Help from another person taking care of personal grooming?: A Little Help from another person toileting, which includes using toliet, bedpan, or urinal?: Total Help from another person bathing (including washing, rinsing, drying)?: A Lot Help from another person to put on and taking off regular upper body clothing?: A Little Help from another person to put on and taking off regular lower body clothing?: Total 6 Click Score: 14   End of Session Nurse Communication: Mobility status  Activity Tolerance: Patient tolerated treatment well;Patient limited by pain Patient left: in bed;with call bell/phone within reach;with nursing/sitter in room (transferred frm ICU bed to standard bed to move to floor.)  OT Visit Diagnosis: Muscle weakness (generalized) (M62.81);Pain Pain - part of body:  (scrotum and R hand)                Time: 2426-8341 OT Time Calculation (min): 26 min Charges:  OT General Charges $OT Visit: 1 Visit OT Evaluation $OT Eval Moderate Complexity: [place], MS, OTR/L ascom [phone] 02/06/21,  10:56 AM

## 2021-02-07 DIAGNOSIS — I4891 Unspecified atrial fibrillation: Secondary | ICD-10-CM | POA: Diagnosis not present

## 2021-02-07 DIAGNOSIS — K8050 Calculus of bile duct without cholangitis or cholecystitis without obstruction: Secondary | ICD-10-CM | POA: Diagnosis not present

## 2021-02-07 LAB — CBC
HCT: 30.3 % — ABNORMAL LOW (ref 39.0–52.0)
Hemoglobin: 9.6 g/dL — ABNORMAL LOW (ref 13.0–17.0)
MCH: 29.7 pg (ref 26.0–34.0)
MCHC: 31.7 g/dL (ref 30.0–36.0)
MCV: 93.8 fL (ref 80.0–100.0)
Platelets: 52 10*3/uL — ABNORMAL LOW (ref 150–400)
RBC: 3.23 MIL/uL — ABNORMAL LOW (ref 4.22–5.81)
RDW: 17.2 % — ABNORMAL HIGH (ref 11.5–15.5)
WBC: 5.2 10*3/uL (ref 4.0–10.5)
nRBC: 0 % (ref 0.0–0.2)

## 2021-02-07 LAB — GLUCOSE, CAPILLARY
Glucose-Capillary: 105 mg/dL — ABNORMAL HIGH (ref 70–99)
Glucose-Capillary: 147 mg/dL — ABNORMAL HIGH (ref 70–99)
Glucose-Capillary: 159 mg/dL — ABNORMAL HIGH (ref 70–99)

## 2021-02-07 LAB — CULTURE, BLOOD (ROUTINE X 2)
Culture: NO GROWTH
Special Requests: ADEQUATE

## 2021-02-07 NOTE — NC FL2 (Signed)
[place] LEVEL OF CARE SCREENING TOOL     IDENTIFICATION  Patient Name: Antonio Phillips Birthdate: 06-May-1945 Sex: male Admission Date (Current Location): 01/31/2021  [place] and [place] Number:  Selena Lesser [id number] [place] and Address:  [facility], [address] [place], [place] [address]      Provider Number: [id number]  Attending Physician Name and Address:  Lorella Nimrod, MD  Relative Name and Phone Number:  None listed    Current Level of Care: Hospital Recommended Level of Care: Other (Comment), [place] (Return to [facility]) Prior Approval Number:    Date Approved/Denied: 05/29/17 PASRR Number: [id number]  Discharge Plan: SNF (Back to [facility])    Current Diagnoses: Patient Active Problem List   Diagnosis Date Noted   Normocytic anemia    Atrial fibrillation with rapid ventricular response ([place])    Preop cardiovascular exam    Abdominal pain 01/31/2021   Choledocholithiasis 01/31/2021   Morbid obesity ([place])    Sepsis due to Escherichia coli ([place])    Wound of skin    Chronic hypotension    Demand ischemia (HCC)    Permanent atrial fibrillation ([place])    Chest pain 09/26/2020   PVD (peripheral vascular disease) ([place])    Shortness of breath 09/20/2020   NSTEMI (non-ST elevated myocardial infarction) ([place]) 09/20/2020   Fungal skin infection    Atrial fibrillation, chronic (HCC)    Acquired thrombophilia (HCC)    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 Left Hip Stage 3 2x2x2. Left stump with foam dressing.)                       Personal Care Assistance Level of Assistance  Bathing, Feeding, Dressing Bathing Assistance: Maximum assistance Feeding assistance: Limited assistance Dressing Assistance: Maximum assistance (Upper body is a little help but Lower Body is total per OT notes.)     Functional Limitations Info   (Right BKA and Left AKA)          SPECIAL CARE FACTORS FREQUENCY  PT (By licensed PT), OT (By licensed OT), Blood pressure, Diabetic urine testing (chronic hypotension, diabetes) Blood Pressure Frequency: per orders Diabetic Urine Testing Frequency: per orders PT Frequency: 5X/week OT Frequency: 5x/week            Contractures Contractures Info: Not present    Additional Factors Info  Code Status, Allergies Code Status Info: DNR Allergies Info: Simvastatin (reaction not specified)           Current Medications (02/07/2021):  This is the current hospital active medication list Current Facility-Administered Medications  Medication Dose Route Frequency Provider Last Rate Last Admin   0.9 %  sodium chloride infusion (Manually program via Guardrails IV Fluids)   Intravenous Once Acheampong, Warnell Bureau, MD   Stopped at 02/02/21 1730  0.9 %  sodium chloride infusion  100 mL Intravenous PRN Breeze, Shantelle, NP       0.9 %  sodium chloride infusion  100 mL Intravenous PRN Colon Flattery, NP       acetaminophen (TYLENOL) tablet 650 mg  650 mg Oral Q6H PRN Cristal Deer, MD   650 mg at 02/06/21 0851   alteplase (CATHFLO ACTIVASE) injection 2 mg  2 mg Intracatheter Once PRN Colon Flattery, NP       amiodarone (NEXTERONE PREMIX) 360-4.14 MG/200ML-% (1.8 mg/mL) IV infusion  30 mg/hr Intravenous Continuous Lang Snow, NP 16.67  mL/hr at 02/07/21 1231 30 mg/hr at 02/07/21 1231   apixaban (ELIQUIS) tablet 5 mg  5 mg Oral BID Lorella Nimrod, MD   5 mg at 02/07/21 1059   atorvastatin (LIPITOR) tablet 40 mg  40 mg Oral QHS Cristal Deer, MD   40 mg at 02/06/21 2106   chlorhexidine (PERIDEX) 0.12 % solution 15 mL  15 mL Mouth Rinse BID Lang Snow, NP   15 mL at 02/07/21 1059   Chlorhexidine Gluconate Cloth 2 % PADS 6 each  6 each Topical Q0600 Artist Beach, MD   6 each at 02/07/21 0522   clopidogrel (PLAVIX) tablet 75 mg  75 mg Oral Q breakfast Lorella Nimrod, MD   75 mg at 02/07/21 0850   clotrimazole (LOTRIMIN) 1 % cream 1 application  1 application Topical BID Cristal Deer, MD   1 application at 51/02/58 1059   dextromethorphan-guaiFENesin ([place] DM) 30-600 MG per 12 hr tablet 1 tablet  1 tablet Oral BID Lorella Nimrod, MD   1 tablet at 02/07/21 1059   diclofenac Sodium (VOLTAREN) 1 % topical gel 2 g  2 g Topical q AM Cristal Deer, MD   2 g at 02/07/21 0850   epoetin alfa (EPOGEN) injection 10,000 Units  10,000 Units Intravenous Q T,Th,Sa-HD Colon Flattery, NP   10,000 Units at 02/06/21 1305   finasteride (PROSCAR) tablet 5 mg  5 mg Oral Daily Cristal Deer, MD   5 mg at 02/07/21 1059   heparin injection 1,000 Units  1,000 Units Dialysis PRN Colon Flattery, NP       HYDROcodone-acetaminophen (NORCO/VICODIN) 5-325 MG per tablet 1 tablet  1 tablet Oral Q6H PRN Cristal Deer, MD   1 tablet at 02/04/21 1813   ipratropium-albuterol (DUONEB) 0.5-2.5 (3) MG/3ML nebulizer solution 3 mL  3 mL Nebulization Q6H PRN Lorella Nimrod, MD       lactulose (CHRONULAC) 10 GM/15ML solution 10 g  10 g Oral BID PRN Cristal Deer, MD       lidocaine (PF) (XYLOCAINE) 1 % injection 5 mL  5 mL Intradermal PRN Colon Flattery, NP       lidocaine-prilocaine (EMLA) cream 1 application  1 application Topical PRN Colon Flattery, NP       loperamide (IMODIUM) capsule 4 mg  4 mg Oral QID PRN Cristal Deer,  MD       loratadine (CLARITIN) tablet 10 mg  10 mg Oral Once per day on Sun Mon Wed Fri Cristal Deer, MD   10 mg at 02/05/21 1222   MEDLINE mouth rinse  15 mL Mouth Rinse q12n4p Lang Snow, NP   15 mL at 02/05/21 1659   midodrine (PROAMATINE) tablet 10 mg  10 mg Oral TID WC Theora Gianotti, NP   10 mg at 02/07/21 1232   morphine 2 MG/ML injection 2 mg  2 mg Intravenous Q4H PRN Mansy, Jan  A, MD   2 mg at 01/31/21 2337   pantoprazole (PROTONIX) injection 40 mg  40 mg Intravenous Q12H Acheampong, Warnell Bureau, MD   40 mg at 02/07/21 1059   pentafluoroprop-tetrafluoroeth (GEBAUERS) aerosol 1 application  1 application Topical PRN Colon Flattery, NP       piperacillin-tazobactam (ZOSYN) IVPB 2.25 g  2.25 g Intravenous Q8H Beers, Shanon Brow, RPH 100 mL/hr at 02/07/21 1505 2.25 g at 02/07/21 1505   polyvinyl alcohol (LIQUIFILM TEARS) 1.4 % ophthalmic solution 1 drop  1 drop Both Eyes TID PRN Cristal Deer, MD       pregabalin (LYRICA) capsule 100 mg  100 mg Oral BID Lorella Nimrod, MD   100 mg at 02/07/21 1059   rOPINIRole (REQUIP) tablet 1 mg  1 mg Oral QHS Cristal Deer, MD   1 mg at 02/06/21 2106   sevelamer carbonate (RENVELA) tablet 1,600 mg  1,600 mg Oral 2 times per day on Tue Thu Sat Antonieta Pert, MD   1,600 mg at 02/06/21 1814   sevelamer carbonate (RENVELA) tablet 1,600 mg  1,600 mg Oral 3 times per day on Sun Mon Wed Fri Dallie Piles, RPH   1,600 mg at 02/07/21 1235   vitamin B-12 (CYANOCOBALAMIN) tablet 1,000 mcg  1,000 mcg Oral Daily Cristal Deer, MD   1,000 mcg at 02/07/21 1059     Discharge Medications: Please see discharge summary for a list of discharge medications.  Relevant Imaging Results:  Relevant Lab Results:   Additional Information [id number]  Izola Price, RN

## 2021-02-07 NOTE — Progress Notes (Signed)
[facility]  ROUNDING NOTE   Subjective:   Antonio Phillips is a 75 year old male with past medical history including hypotension, obesity, peripheral artery disease, diabetes, hyperlipidemia, and end-stage renal disease on dialysis.  Patient presents to emergency department with abdominal pain persisting for 2 days.  Patient admitted for further evaluation for Choledocholithiasis [K80.50] Pain [R52]  Patient is known to our practice for previous admissions and receives outpatient dialysis at [facility], supervised by Dr. Candiss Phillips.  He receives dialysis on a Tuesday Thursday Saturday schedule.       Patient offers no new specific physical complaints.  Patient was lying comfortably in the bed Patient offers no complaint of chest pain/shortness of breath     Objective:  Vital signs in last 24 hours:  Temp:  [97.3 F (36.3 C)-98.8 F (37.1 C)] 98.2 F (36.8 C) (12/04 0800) Pulse Rate:  [71-108] 104 (12/04 0800) Resp:  [16-18] 18 (12/04 0800) BP: (87-120)/(52-79) 120/79 (12/04 0800) SpO2:  [93 %-100 %] 100 % (12/04 0800) Weight:  [110.3 kg-111.4 kg] 110.3 kg (12/03 1543)  Weight change: 8.59 kg Filed Weights   02/05/21 1200 02/06/21 1202 02/06/21 1543  Weight: 101.8 kg 111.4 kg 110.3 kg    Intake/Output: I/O last 3 completed shifts: In: 1544.1 [P.O.:840; I.V.:504.1; IV Piggyback:200] Out: 1081 [Urine:80; Other:1001]   Intake/Output this shift:  No intake/output data recorded.  Physical Exam: General: NAD, resting comfortably  Head: Normocephalic, atraumatic. Moist oral mucosal membranes  Eyes: Anicteric  Lungs:  Clear to auscultation, normal effort  Heart: Irregular  Abdomen:  Soft, tender, mild distention  Extremities: No peripheral edema.  Right BKA left AKA  Neurologic: Nonfocal, moving all four extremities  Skin: No lesions  Access: Left AVF    Basic Metabolic Panel: Recent Labs  Lab 02/01/21 0618 02/02/21 0030 02/02/21 0647  02/02/21 1807 02/03/21 0501  NA 136 133* 135 136 133*  K 5.8* 3.9 4.3 4.4 4.5  CL 100 96* 98 100 98  CO2 23 26 26 25 25   GLUCOSE 78 87 87 85 89  BUN 77* 36* 42* 47* 54*  CREATININE 8.12* 4.52* 4.75* 5.06* 5.56*  CALCIUM 8.4* 8.7* 8.4* 8.7* 8.1*  MG  --  1.4*  --   --  1.8  PHOS 8.0*  --   --   --   --     Liver Function Tests: Recent Labs  Lab 02/01/21 0618 02/02/21 0647 02/02/21 1807 02/03/21 1302 02/05/21 0416  AST 51* 40 45* 34 15  ALT 42 32 38 30 18  ALKPHOS 332* 423* 457* 364* 268*  BILITOT 3.3* 3.6* 3.8* 2.6* 1.7*  PROT 5.2* 5.3* 5.5* 5.5* 5.2*  ALBUMIN 2.8* 2.8* 2.9* 2.7* 2.4*   No results for input(s): LIPASE, AMYLASE in the last 168 hours.  Recent Labs  Lab 02/02/21 0030  AMMONIA 29    CBC: Recent Labs  Lab 02/02/21 1807 02/03/21 0501 02/03/21 1302 02/04/21 0415 02/05/21 0416 02/06/21 0437 02/07/21 0544  WBC 4.6   < > 6.0 4.0 4.9 6.2 5.2  NEUTROABS 3.9  --  4.4  --   --   --   --   HGB 10.1*   < > 9.6* 9.1* 9.6* 9.3* 9.6*  HCT 31.4*   < > 30.2* 28.0* 29.8* 29.1* 30.3*  MCV 94.0   < > 94.7 94.3 94.9 93.0 93.8  PLT 41*   < > 57* 41* 51* 44* 52*   < > = values in this interval not displayed.  Cardiac Enzymes: No results for input(s): CKTOTAL, CKMB, CKMBINDEX, TROPONINI in the last 168 hours.  BNP: Invalid input(s): POCBNP  CBG: Recent Labs  Lab 02/05/21 2322 02/06/21 0736 02/06/21 1126 02/06/21 2138 02/07/21 0730  GLUCAP 99 84 118* 128* 105*    Microbiology: Results for orders placed or performed during the hospital encounter of 01/31/21  Resp Panel by RT-PCR (Flu A&B, Covid) Nasopharyngeal Swab     Status: None   Collection Time: 01/31/21  9:11 AM   Specimen: Nasopharyngeal Swab; Nasopharyngeal(NP) swabs in vial transport medium  Result Value Ref Range Status   SARS Coronavirus 2 by RT PCR NEGATIVE NEGATIVE Final    Comment: (NOTE) SARS-CoV-2 target nucleic acids are NOT DETECTED.  The SARS-CoV-2 RNA is generally detectable  in upper respiratory specimens during the acute phase of infection. The lowest concentration of SARS-CoV-2 viral copies this assay can detect is 138 copies/mL. A negative result does not preclude SARS-Cov-2 infection and should not be used as the sole basis for treatment or other patient management decisions. A negative result may occur with  improper specimen collection/handling, submission of specimen other than nasopharyngeal swab, presence of viral mutation(s) within the areas targeted by this assay, and inadequate number of viral copies(<138 copies/mL). A negative result must be combined with clinical observations, patient history, and epidemiological information. The expected result is Negative.  Fact Sheet for Patients:  EntrepreneurPulse.com.au  Fact Sheet for Healthcare Providers:  IncredibleEmployment.be  This test is no t yet approved or cleared by the [place] FDA and  has been authorized for detection and/or diagnosis of SARS-CoV-2 by FDA under an Emergency Use Authorization (EUA). This EUA will remain  in effect (meaning this test can be used) for the duration of the COVID-19 declaration under Section 564(b)(1) of the Act, 21 U.S.C.section 360bbb-3(b)(1), unless the authorization is terminated  or revoked sooner.       Influenza A by PCR NEGATIVE NEGATIVE Final   Influenza B by PCR NEGATIVE NEGATIVE Final    Comment: (NOTE) The Xpert Xpress SARS-CoV-2/FLU/RSV plus assay is intended as an aid in the diagnosis of influenza from Nasopharyngeal swab specimens and should not be used as a sole basis for treatment. Nasal washings and aspirates are unacceptable for Xpert Xpress SARS-CoV-2/FLU/RSV testing.  Fact Sheet for Patients: EntrepreneurPulse.com.au  Fact Sheet for Healthcare Providers: IncredibleEmployment.be  This test is not yet approved or cleared by the [place] FDA and has  been authorized for detection and/or diagnosis of SARS-CoV-2 by FDA under an Emergency Use Authorization (EUA). This EUA will remain in effect (meaning this test can be used) for the duration of the COVID-19 declaration under Section 564(b)(1) of the Act, 21 U.S.C. section 360bbb-3(b)(1), unless the authorization is terminated or revoked.  Performed at [facility], [address] [place], [place] [address]   MRSA Next Gen by PCR, Nasal     Status: None   Collection Time: 02/02/21  9:21 AM   Specimen: Nasal Mucosa; Nasal Swab  Result Value Ref Range Status   MRSA by PCR Next Gen NOT DETECTED NOT DETECTED Final    Comment: (NOTE) The GeneXpert MRSA Assay (FDA approved for NASAL specimens only), is one component of a comprehensive MRSA colonization surveillance program. It is not intended to diagnose MRSA infection nor to guide or monitor treatment for MRSA infections. Test performance is not FDA approved in patients less than 28 years old. Performed at [facility], [address], [place], [place] [address]  CULTURE, BLOOD (ROUTINE X 2) w Reflex to ID Panel     Status: Abnormal (Preliminary result)   Collection Time: 02/02/21  6:07 PM   Specimen: BLOOD  Result Value Ref Range Status   Specimen Description   Final    BLOOD BLOOD RIGHT HAND Performed at [facility], [address], [place], [place] [address]    Special Requests   Final    BOTTLES DRAWN AEROBIC AND ANAEROBIC Blood Culture adequate volume Performed at [facility], [address] [place], [place] [address]    Culture  Setup Time   Final    GRAM NEGATIVE RODS AEROBIC BOTTLE ONLY Organism ID to follow CRITICAL RESULT CALLED TO, READ BACK BY AND VERIFIED WITH: BRANDON BEERS 02/05/21 2049 MU Performed at [facility], [address] [place], [place] [address]    Culture (A)  Final    ESCHERICHIA COLI SUSCEPTIBILITIES TO FOLLOW Performed at [place], [place] [address], [place], [place] [address]    Report Status PENDING  Incomplete  CULTURE, BLOOD (ROUTINE X 2) w Reflex to ID Panel     Status: None   Collection Time: 02/02/21  6:07 PM   Specimen: BLOOD  Result Value Ref Range Status   Specimen Description BLOOD BLOOD RIGHT FOREARM  Final   Special Requests   Final    BOTTLES DRAWN AEROBIC AND ANAEROBIC Blood Culture adequate volume   Culture   Final    NO GROWTH 5 DAYS Performed at [facility], [address] [place], [place] [address]    Report Status 02/07/2021 FINAL  Final  Blood Culture ID Panel (Reflexed)     Status: Abnormal   Collection Time: 02/02/21  6:07 PM  Result Value Ref Range Status   Enterococcus faecalis NOT DETECTED NOT DETECTED Final   Enterococcus Faecium NOT DETECTED NOT DETECTED Final   Listeria monocytogenes NOT DETECTED NOT DETECTED Final   Staphylococcus species NOT DETECTED NOT DETECTED Final   Staphylococcus aureus (BCID) NOT DETECTED NOT DETECTED Final   Staphylococcus epidermidis NOT DETECTED NOT DETECTED Final   Staphylococcus lugdunensis NOT DETECTED NOT DETECTED Final   Streptococcus species NOT DETECTED NOT DETECTED Final   Streptococcus agalactiae NOT DETECTED NOT DETECTED Final   Streptococcus pneumoniae NOT DETECTED NOT DETECTED Final   Streptococcus pyogenes NOT DETECTED NOT DETECTED Final   A.calcoaceticus-baumannii NOT DETECTED NOT DETECTED Final   Bacteroides fragilis NOT DETECTED NOT DETECTED Final   Enterobacterales DETECTED (A) NOT DETECTED Final    Comment: Enterobacterales represent a large order of gram negative bacteria, not a single organism. CRITICAL RESULT CALLED TO, READ BACK BY AND VERIFIED WITH: BRANDOM BEERS @2049  ON 02/05/21 MU    Enterobacter cloacae complex NOT DETECTED NOT DETECTED Final   Escherichia coli DETECTED (A) NOT DETECTED Final    Comment: CRITICAL RESULT CALLED TO, READ BACK BY AND VERIFIED WITH: BRANDOM BEERS 02/05/21 2049 MU    Klebsiella  aerogenes NOT DETECTED NOT DETECTED Final   Klebsiella oxytoca NOT DETECTED NOT DETECTED Final   Klebsiella pneumoniae NOT DETECTED NOT DETECTED Final   Proteus species NOT DETECTED NOT DETECTED Final   Salmonella species NOT DETECTED NOT DETECTED Final   Serratia marcescens NOT DETECTED NOT DETECTED Final   Haemophilus influenzae NOT DETECTED NOT DETECTED Final   Neisseria meningitidis NOT DETECTED NOT DETECTED Final   Pseudomonas aeruginosa NOT DETECTED NOT DETECTED Final   Stenotrophomonas maltophilia NOT DETECTED NOT DETECTED Final   Candida albicans NOT DETECTED  NOT DETECTED Final   Candida auris NOT DETECTED NOT DETECTED Final   Candida glabrata NOT DETECTED NOT DETECTED Final   Candida krusei NOT DETECTED NOT DETECTED Final   Candida parapsilosis NOT DETECTED NOT DETECTED Final   Candida tropicalis NOT DETECTED NOT DETECTED Final   Cryptococcus neoformans/gattii NOT DETECTED NOT DETECTED Final   CTX-M ESBL NOT DETECTED NOT DETECTED Final   Carbapenem resistance IMP NOT DETECTED NOT DETECTED Final   Carbapenem resistance KPC NOT DETECTED NOT DETECTED Final   Carbapenem resistance NDM NOT DETECTED NOT DETECTED Final   Carbapenem resist OXA 48 LIKE NOT DETECTED NOT DETECTED Final   Carbapenem resistance VIM NOT DETECTED NOT DETECTED Final    Comment: Performed at [facility], [address] [place], [place] [address]    Coagulation Studies: No results for input(s): LABPROT, INR in the last 72 hours.   Urinalysis: No results for input(s): COLORURINE, LABSPEC, PHURINE, GLUCOSEU, HGBUR, BILIRUBINUR, KETONESUR, PROTEINUR, UROBILINOGEN, NITRITE, LEUKOCYTESUR in the last 72 hours.  Invalid input(s): APPERANCEUR    Imaging: DG Chest 2 View  Result Date: 02/05/2021 CLINICAL DATA:  Pleuritic chest pain. EXAM: CHEST - 2 VIEW COMPARISON:  CT examination 01/31/2021 FINDINGS: Stable enlargement of the cardiopericardial silhouette. Blunting of the posterior costophrenic  angles increased from 01/31/2021 suspicious for enlarging bilateral pleural effusions with associated passive atelectasis. Substantial thoracic spondylosis. Atherosclerotic calcification of the aortic arch. Clips in the right neck compatible with prior right thyroidectomy. IMPRESSION: 1. Small to moderate but enlarging bilateral pleural effusions with blunting of the posterior costophrenic angles and some fluid tracking along one of the major fissures. 2. Stable enlargement of the cardiopericardial silhouette, on recent CT this was due to a combination of both cardiomegaly and small pericardial effusion. 3. Aortic Atherosclerosis (ICD10-I70.0). Electronically Signed   By: Van Clines M.D.   On: 02/05/2021 15:33   DG Hand Complete Right  Result Date: 02/06/2021 CLINICAL DATA:  RIGHT hand pain, no history of injury. EXAM: RIGHT HAND - COMPLETE 3+ VIEW COMPARISON:  Wrist evaluation of February 02, 2021. FINDINGS: Degenerative changes about the hand in the distal interphalangeal joints. Degenerative changes about the wrist as seen on prior wrist imaging. Vascular calcifications in the soft tissues. Suggestion of soft tissue swelling diffusely about the hand. Limited due to limited range of motion with respect to phalanges on the lateral projection. No signs of acute fracture or dislocation or acute bony abnormality within the limitations of the current study. IMPRESSION: Degenerative changes about the hand in distal interphalangeal joints. No acute fracture. Suggestion of soft tissue swelling diffusely about the hand. Correlate with any signs of infection. Mildly limited exam due to limited range of motion about the hand. Electronically Signed   By: Zetta Bills M.D.   On: 02/06/2021 13:36     Medications:    sodium chloride     sodium chloride     amiodarone 30 mg/hr (02/06/21 1413)   piperacillin-tazobactam (ZOSYN)  IV 2.25 g (02/07/21 0629)    sodium chloride   Intravenous Once   apixaban  5  mg Oral BID   atorvastatin  40 mg Oral QHS   chlorhexidine  15 mL Mouth Rinse BID   Chlorhexidine Gluconate Cloth  6 each Topical Q0600   clopidogrel  75 mg Oral Q breakfast   clotrimazole  1 application Topical BID   dextromethorphan-guaiFENesin  1 tablet Oral BID   diclofenac Sodium  2 g Topical q AM   epoetin (EPOGEN/PROCRIT) injection  10,000  Units Intravenous Q T,Th,Sa-HD   finasteride  5 mg Oral Daily   loratadine  10 mg Oral Once per day on Sun Mon Wed Fri   mouth rinse  15 mL Mouth Rinse q12n4p   midodrine  10 mg Oral TID WC   pantoprazole (PROTONIX) IV  40 mg Intravenous Q12H   pregabalin  100 mg Oral BID   rOPINIRole  1 mg Oral QHS   sevelamer carbonate  1,600 mg Oral 2 times per day on Tue Thu Sat   sevelamer carbonate  1,600 mg Oral 3 times per day on Sun Mon Wed Fri   vitamin B-12  1,000 mcg Oral Daily     Assessment/ Plan:  Mr. IZAIA SAY is a 75 y.o.  male with past medical history including hypotension, obesity, peripheral artery disease, diabetes, hyperlipidemia, and end-stage renal disease on dialysis.  Patient presents to emergency department with abdominal pain persisting for 2 days.  Patient admitted for further evaluation for Choledocholithiasis [K80.50] Pain [R52]  [facility]/TTS/Lt AVF   Renal Patient is on end-stage disease. Patient is on hemodialysis Patient is on Tuesday Thursday Saturday schedule. Patient did receive dialysis on Friday as he did not go for his dialysis on his regular scheduled time on Thursday. Patient was last dialyzed yesterday No need for renal placement therapy today  2. Anemia of chronic kidney disease Lab Results  Component Value Date   HGB 9.6 (L) 02/07/2021    Hemoglobin is at goal-9-11 Will continue to maintain patient on Epogen.  3. Secondary Hyperparathyroidism: Lab Results  Component Value Date   CALCIUM 8.1 (L) 02/03/2021   PHOS 8.0 (H) 02/01/2021  Continue Renvela and periodic monitoring  of bone mineral metabolism parameters.   4.Acute biliary pancreatitis secondary to choledocholithiasis Patient underwent ERCP. Patient is now s/p stent placement Patient is being followed by primary and GI   5. Hypotension    On Midodrine    LOS: 7 Davante Gerke s Jeslin Bazinet 12/4/20229:37 AM

## 2021-02-07 NOTE — Progress Notes (Signed)
PROGRESS NOTE    [name]  HYW:[id number] DOB: 1945-10-11 DOA: 01/31/2021 PCP: Marsh Dolly, MD   Brief Narrative: Taken from prior notes. Antonio Phillips, 75 y.o. 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 fib on Eliquis, HLD, chronic hypotension on midodrine, morbid obesity who presented from nursing home with complaints of abdominal pain and emesis of 2-3 days duration.    Seen in the ED,cxr pulmonary edema,at least moderate pleural effusion with compressive atelectasis, CT chest abdomen pelvis showed acute pancreatitis, choledocholithiasis 1.4 cm calculus in the distal CBD with biliary duct dilatation suggesting obstruction .Clinically no concerns for acute cholangitis however.  ERCP was advised by Gastroenterology.  Initially canceled due to persistent hypotension, received 2 units of PRBC for hematochezia.  Holding Eliquis and Plavix. Transferred to stepdown due to worsening hypotension, map goal of 55.  Patient underwent successful ERCP with GI, found to have dilated bile duct with 1 stone, removal was not attempted but a stent was placed which needs to be removed in 1 month with another ERCP.  Patient is high risk for deterioration and death based on multiple life limiting comorbidities.  Palliative care was also consulted, initially patient would like to stay as full code but after another discussion today, CODE STATUS changed to DNR with full scope of medical care.  Subjective: Patient was seen and examined today.  No new complaints.  Left stump pain seems little better.  Still having right hand pain which increases with movement of fingers.  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  Assessment & Plan:   Principal Problem:   Abdominal pain Active Problems:   ESRD (end stage renal disease) (HCC)   Atrial fibrillation, chronic (HCC)   Wound  of skin   Choledocholithiasis   Morbid obesity (HCC)   Normocytic anemia   Atrial fibrillation with rapid ventricular response (HCC)  Acute on chronic anemia.  Most likely some GI loss.  Underwent ERCP with stent placement in common bile duct with GI.  Received 2 unit of PRBC.  PPI was initiated.  Hemoglobin improved to 10.1 after getting blood transfusions, currently seems stable with hemoglobin of 9.6 today.  Might be some element of bone marrow suppression with recent infection.   -Continue to monitor -Transfuse if below 8  Acute biliary pancreatitis secondary to choledocholithiasis.  Lipase greater than 1000 on admission.  This was confirmed on CT scan.  Underwent successful ERCP with GI, found to have severely dilated common bile duct with a bile stone, no attempt was made for stone removal but a stent was placed which needs to be removed in 1 month with a second ERCP. He was placed on Zosyn due to worsening hypotension. Preliminary blood cultures negative. -Continue with Zosyn-can be discharged on Cipro and Flagyl to complete a 7-day course. -Monitor for any sign of perforation or worsening cholangitis.  Nursing concern of dark-colored bowel movement.  Hemoglobin currently stable around 9.6.  Can be due to some bleeding during procedure.  -Monitor for any melena or hematochezia -Monitor hemoglobin  Acute on chronic thrombocytopenia.  Baseline platelet count around 100.  Today at 44,   Hematology was consulted and they are recommending 1 unit of platelet before the ERCP and 1 during the procedure. -Recommending to proceed with platelet transfusion is started bleeding or if counts drop below 15. -Some concern of  peripheral destruction but they are not recommending steroid at this time, might need steroid and/or IVIG. -Appreciate hematology recommendations.  Acute on chronic hypotension.  Blood pressure within goal.  On midodrine at baseline.  Goal MAP of 55. -Continue with  midodrine-dose was increased to 10 mg 3 times a day with improvement in blood pressure. -Continue to monitor  Permanent atrial fibrillation.  Cardiology is on board, he was placed on amiodarone.  Initially home dose of Eliquis was held and was restarted yesterday after ERCP and clearance from GI. -Continue home dose of Eliquis -Continue to monitor  Right hand pain.  Some edema with no erythema of right hand.  Right hand x-ray with advanced degenerative changes, no fractures. -Supportive care  Left stamped burning pain.  Some improvement after increasing the dose of Lyrica. -Increase the dose of Lyrica from 75 to 100 twice daily.  CAD with severe single-vessel disease.  Medical management per cardiology at this time. -Restarting home dose of Eliquis and Plavix. -Discontinue aspirin  Multiple decubitus ulcers.  Wound care was consulted -Continue with wound care  History of PAD.  S/p bilateral lower extremity amputations. -Continue with statin -Restart home dose of Eliquis and Plavix -Discontinue aspirin  Objective: Vitals:   02/07/21 0413 02/07/21 0800 02/07/21 0850 02/07/21 1159  BP: 100/62 120/79  119/81  Pulse: 71 (!) 104  (!) 102  Resp: 18 18  18   Temp: 98.8 F (37.1 C) 98.2 F (36.8 C)  98.3 F (36.8 C)  TempSrc:      SpO2: 93% 100% 93% 100%  Weight:      Height:        Intake/Output Summary (Last 24 hours) at 02/07/2021 1437 Last data filed at 02/07/2021 1431 Gross per 24 hour  Intake 1110 ml  Output 1001 ml  Net 109 ml    Filed Weights   02/05/21 1200 02/06/21 1202 02/06/21 1543  Weight: 101.8 kg 111.4 kg 110.3 kg    Examination:  General.     In no acute distress. Pulmonary.  Lungs clear bilaterally, normal respiratory effort. CV.  Regular rate and rhythm, no JVD, rub or murmur. Abdomen.  Soft, nontender, nondistended, BS positive. CNS.  Alert and oriented .  No focal neurologic deficit. Extremities.  Right BKA, left AKA.  DVT prophylaxis:  Eliquis Code Status: DNR Family Communication:  Disposition Plan:  Status is: Inpatient  Remains inpatient appropriate because: Severity of illness   Level of care: Telemetry Cardiac  All the records are reviewed and case discussed with Care Management/Social Worker. Management plans discussed with the patient, nursing and they are in agreement.  Consultants:  PCCM Cardiology GI Nephrology  Procedures:  Antimicrobials:  Zosyn  Data Reviewed: I have personally reviewed following labs and imaging studies  CBC: Recent Labs  Lab 02/02/21 1807 02/03/21 0501 02/03/21 1302 02/04/21 0415 02/05/21 0416 02/06/21 0437 02/07/21 0544  WBC 4.6   < > 6.0 4.0 4.9 6.2 5.2  NEUTROABS 3.9  --  4.4  --   --   --   --   HGB 10.1*   < > 9.6* 9.1* 9.6* 9.3* 9.6*  HCT 31.4*   < > 30.2* 28.0* 29.8* 29.1* 30.3*  MCV 94.0   < > 94.7 94.3 94.9 93.0 93.8  PLT 41*   < > 57* 41* 51* 44* 52*   < > = values in this interval not displayed.    Basic Metabolic Panel: Recent Labs  Lab 02/01/21 0618 02/02/21 0030 02/02/21 3532 02/02/21  1807 02/03/21 0501  NA 136 133* 135 136 133*  K 5.8* 3.9 4.3 4.4 4.5  CL 100 96* 98 100 98  CO2 23 26 26 25 25   GLUCOSE 78 87 87 85 89  BUN 77* 36* 42* 47* 54*  CREATININE 8.12* 4.52* 4.75* 5.06* 5.56*  CALCIUM 8.4* 8.7* 8.4* 8.7* 8.1*  MG  --  1.4*  --   --  1.8  PHOS 8.0*  --   --   --   --     GFR: Estimated Creatinine Clearance: 14.3 mL/min (A) (by C-G formula based on SCr of 5.56 mg/dL (H)). Liver Function Tests: Recent Labs  Lab 02/01/21 0618 02/02/21 0647 02/02/21 1807 02/03/21 1302 02/05/21 0416  AST 51* 40 45* 34 15  ALT 42 32 38 30 18  ALKPHOS 332* 423* 457* 364* 268*  BILITOT 3.3* 3.6* 3.8* 2.6* 1.7*  PROT 5.2* 5.3* 5.5* 5.5* 5.2*  ALBUMIN 2.8* 2.8* 2.9* 2.7* 2.4*    No results for input(s): LIPASE, AMYLASE in the last 168 hours.  Recent Labs  Lab 02/02/21 0030  AMMONIA 29    Coagulation Profile: Recent Labs  Lab  02/02/21 0647  INR 1.9*    Cardiac Enzymes: No results for input(s): CKTOTAL, CKMB, CKMBINDEX, TROPONINI in the last 168 hours. BNP (last 3 results) No results for input(s): PROBNP in the last 8760 hours. HbA1C: No results for input(s): HGBA1C in the last 72 hours. CBG: Recent Labs  Lab 02/06/21 0736 02/06/21 1126 02/06/21 2138 02/07/21 0730 02/07/21 1200  GLUCAP 84 118* 128* 105* 147*    Lipid Profile: No results for input(s): CHOL, HDL, LDLCALC, TRIG, CHOLHDL, LDLDIRECT in the last 72 hours. Thyroid Function Tests: No results for input(s): TSH, T4TOTAL, FREET4, T3FREE, THYROIDAB in the last 72 hours. Anemia Panel: No results for input(s): VITAMINB12, FOLATE, FERRITIN, TIBC, IRON, RETICCTPCT in the last 72 hours.  Sepsis Labs: Recent Labs  Lab 02/02/21 1807 02/02/21 2135 02/03/21 0430 02/04/21 0413  PROCALCITON 21.06  --  17.01 9.28  LATICACIDVEN 1.7 2.1*  --   --      Recent Results (from the past 240 hour(s))  Resp Panel by RT-PCR (Flu A&B, Covid) Nasopharyngeal Swab     Status: None   Collection Time: 01/31/21  9:11 AM   Specimen: Nasopharyngeal Swab; Nasopharyngeal(NP) swabs in vial transport medium  Result Value Ref Range Status   SARS Coronavirus 2 by RT PCR NEGATIVE NEGATIVE Final    Comment: (NOTE) SARS-CoV-2 target nucleic acids are NOT DETECTED.  The SARS-CoV-2 RNA is generally detectable in upper respiratory specimens during the acute phase of infection. The lowest concentration of SARS-CoV-2 viral copies this assay can detect is 138 copies/mL. A negative result does not preclude SARS-Cov-2 infection and should not be used as the sole basis for treatment or other patient management decisions. A negative result may occur with  improper specimen collection/handling, submission of specimen other than nasopharyngeal swab, presence of viral mutation(s) within the areas targeted by this assay, and inadequate number of viral copies(<138 copies/mL). A  negative result must be combined with clinical observations, patient history, and epidemiological information. The expected result is Negative.  Fact Sheet for Patients:  EntrepreneurPulse.com.au  Fact Sheet for Healthcare Providers:  IncredibleEmployment.be  This test is no t yet approved or cleared by the [place] FDA and  has been authorized for detection and/or diagnosis of SARS-CoV-2 by FDA under an Emergency Use Authorization (EUA). This EUA will remain  in effect (meaning this  test can be used) for the duration of the COVID-19 declaration under Section 564(b)(1) of the Act, 21 U.S.C.section 360bbb-3(b)(1), unless the authorization is terminated  or revoked sooner.       Influenza A by PCR NEGATIVE NEGATIVE Final   Influenza B by PCR NEGATIVE NEGATIVE Final    Comment: (NOTE) The Xpert Xpress SARS-CoV-2/FLU/RSV plus assay is intended as an aid in the diagnosis of influenza from Nasopharyngeal swab specimens and should not be used as a sole basis for treatment. Nasal washings and aspirates are unacceptable for Xpert Xpress SARS-CoV-2/FLU/RSV testing.  Fact Sheet for Patients: EntrepreneurPulse.com.au  Fact Sheet for Healthcare Providers: IncredibleEmployment.be  This test is not yet approved or cleared by the [place] FDA and has been authorized for detection and/or diagnosis of SARS-CoV-2 by FDA under an Emergency Use Authorization (EUA). This EUA will remain in effect (meaning this test can be used) for the duration of the COVID-19 declaration under Section 564(b)(1) of the Act, 21 U.S.C. section 360bbb-3(b)(1), unless the authorization is terminated or revoked.  Performed at [facility], [address] [place], [place] [address]   MRSA Next Gen by PCR, Nasal     Status: None   Collection Time: 02/02/21  9:21 AM   Specimen: Nasal Mucosa; Nasal Swab  Result Value Ref  Range Status   MRSA by PCR Next Gen NOT DETECTED NOT DETECTED Final    Comment: (NOTE) The GeneXpert MRSA Assay (FDA approved for NASAL specimens only), is one component of a comprehensive MRSA colonization surveillance program. It is not intended to diagnose MRSA infection nor to guide or monitor treatment for MRSA infections. Test performance is not FDA approved in patients less than 68 years old. Performed at [facility], [address] [place], [place] [address]   CULTURE, BLOOD (ROUTINE X 2) w Reflex to ID Panel     Status: Abnormal (Preliminary result)   Collection Time: 02/02/21  6:07 PM   Specimen: BLOOD  Result Value Ref Range Status   Specimen Description   Final    BLOOD BLOOD RIGHT HAND Performed at [facility], [address], [place], [place] [address]    Special Requests   Final    BOTTLES DRAWN AEROBIC AND ANAEROBIC Blood Culture adequate volume Performed at [facility], [address] [place], [place] [address]    Culture  Setup Time   Final    GRAM NEGATIVE RODS AEROBIC BOTTLE ONLY Organism ID to follow CRITICAL RESULT CALLED TO, READ BACK BY AND VERIFIED WITH: BRANDON BEERS 02/05/21 2049 MU Performed at [facility], [address], [place], [place] [address]    Culture (A)  Final    ESCHERICHIA COLI SUSCEPTIBILITIES TO FOLLOW Performed at [facility], [place] [address] [place], [place] [address]    Report Status PENDING  Incomplete  CULTURE, BLOOD (ROUTINE X 2) w Reflex to ID Panel     Status: None   Collection Time: 02/02/21  6:07 PM   Specimen: BLOOD  Result Value Ref Range Status   Specimen Description BLOOD BLOOD RIGHT FOREARM  Final   Special Requests   Final    BOTTLES DRAWN AEROBIC AND ANAEROBIC Blood Culture adequate volume   Culture   Final    NO GROWTH 5 DAYS Performed at [facility], [address] [place], [place] [address]    Report Status 02/07/2021 FINAL  Final  Blood  Culture ID Panel (Reflexed)     Status: Abnormal  Collection Time: 02/02/21  6:07 PM  Result Value Ref Range Status   Enterococcus faecalis NOT DETECTED NOT DETECTED Final   Enterococcus Faecium NOT DETECTED NOT DETECTED Final   Listeria monocytogenes NOT DETECTED NOT DETECTED Final   Staphylococcus species NOT DETECTED NOT DETECTED Final   Staphylococcus aureus (BCID) NOT DETECTED NOT DETECTED Final   Staphylococcus epidermidis NOT DETECTED NOT DETECTED Final   Staphylococcus lugdunensis NOT DETECTED NOT DETECTED Final   Streptococcus species NOT DETECTED NOT DETECTED Final   Streptococcus agalactiae NOT DETECTED NOT DETECTED Final   Streptococcus pneumoniae NOT DETECTED NOT DETECTED Final   Streptococcus pyogenes NOT DETECTED NOT DETECTED Final   A.calcoaceticus-baumannii NOT DETECTED NOT DETECTED Final   Bacteroides fragilis NOT DETECTED NOT DETECTED Final   Enterobacterales DETECTED (A) NOT DETECTED Final    Comment: Enterobacterales represent a large order of gram negative bacteria, not a single organism. CRITICAL RESULT CALLED TO, READ BACK BY AND VERIFIED WITH: BRANDOM BEERS @2049  ON 02/05/21 MU    Enterobacter cloacae complex NOT DETECTED NOT DETECTED Final   Escherichia coli DETECTED (A) NOT DETECTED Final    Comment: CRITICAL RESULT CALLED TO, READ BACK BY AND VERIFIED WITH: BRANDOM BEERS 02/05/21 2049 MU    Klebsiella aerogenes NOT DETECTED NOT DETECTED Final   Klebsiella oxytoca NOT DETECTED NOT DETECTED Final   Klebsiella pneumoniae NOT DETECTED NOT DETECTED Final   Proteus species NOT DETECTED NOT DETECTED Final   Salmonella species NOT DETECTED NOT DETECTED Final   Serratia marcescens NOT DETECTED NOT DETECTED Final   Haemophilus influenzae NOT DETECTED NOT DETECTED Final   Neisseria meningitidis NOT DETECTED NOT DETECTED Final   Pseudomonas aeruginosa NOT DETECTED NOT DETECTED Final   Stenotrophomonas maltophilia NOT DETECTED NOT DETECTED Final   Candida albicans  NOT DETECTED NOT DETECTED Final   Candida auris NOT DETECTED NOT DETECTED Final   Candida glabrata NOT DETECTED NOT DETECTED Final   Candida krusei NOT DETECTED NOT DETECTED Final   Candida parapsilosis NOT DETECTED NOT DETECTED Final   Candida tropicalis NOT DETECTED NOT DETECTED Final   Cryptococcus neoformans/gattii NOT DETECTED NOT DETECTED Final   CTX-M ESBL NOT DETECTED NOT DETECTED Final   Carbapenem resistance IMP NOT DETECTED NOT DETECTED Final   Carbapenem resistance KPC NOT DETECTED NOT DETECTED Final   Carbapenem resistance NDM NOT DETECTED NOT DETECTED Final   Carbapenem resist OXA 48 LIKE NOT DETECTED NOT DETECTED Final   Carbapenem resistance VIM NOT DETECTED NOT DETECTED Final    Comment: Performed at [facility], [address], [place], [place] [address]      Radiology Studies: DG Hand Complete Right  Result Date: 02/06/2021 CLINICAL DATA:  RIGHT hand pain, no history of injury. EXAM: RIGHT HAND - COMPLETE 3+ VIEW COMPARISON:  Wrist evaluation of February 02, 2021. FINDINGS: Degenerative changes about the hand in the distal interphalangeal joints. Degenerative changes about the wrist as seen on prior wrist imaging. Vascular calcifications in the soft tissues. Suggestion of soft tissue swelling diffusely about the hand. Limited due to limited range of motion with respect to phalanges on the lateral projection. No signs of acute fracture or dislocation or acute bony abnormality within the limitations of the current study. IMPRESSION: Degenerative changes about the hand in distal interphalangeal joints. No acute fracture. Suggestion of soft tissue swelling diffusely about the hand. Correlate with any signs of infection. Mildly limited exam due to limited range of motion about the hand. Electronically Signed   By: Jewel Baize.D.  On: 02/06/2021 13:36    Scheduled Meds:  sodium chloride   Intravenous Once   apixaban  5 mg Oral BID   atorvastatin  40 mg Oral QHS    chlorhexidine  15 mL Mouth Rinse BID   Chlorhexidine Gluconate Cloth  6 each Topical Q0600   clopidogrel  75 mg Oral Q breakfast   clotrimazole  1 application Topical BID   dextromethorphan-guaiFENesin  1 tablet Oral BID   diclofenac Sodium  2 g Topical q AM   epoetin (EPOGEN/PROCRIT) injection  10,000 Units Intravenous Q T,Th,Sa-HD   finasteride  5 mg Oral Daily   loratadine  10 mg Oral Once per day on Sun Mon Wed Fri   mouth rinse  15 mL Mouth Rinse q12n4p   midodrine  10 mg Oral TID WC   pantoprazole (PROTONIX) IV  40 mg Intravenous Q12H   pregabalin  100 mg Oral BID   rOPINIRole  1 mg Oral QHS   sevelamer carbonate  1,600 mg Oral 2 times per day on Tue Thu Sat   sevelamer carbonate  1,600 mg Oral 3 times per day on Sun Mon Wed Fri   vitamin B-12  1,000 mcg Oral Daily   Continuous Infusions:  sodium chloride     sodium chloride     amiodarone 30 mg/hr (02/07/21 1231)   piperacillin-tazobactam (ZOSYN)  IV Stopped (02/07/21 0725)     LOS: 7 days   Time spent: 40 minutes. More than 50% of the time was spent in counseling/coordination of care  Lorella Nimrod, MD [facility]  If 7PM-7AM, please contact night-coverage [email or web address]  02/07/2021, 2:37 PM   This record has been created using Systems analyst. Errors have been sought and corrected,but may not always be located. Such creation errors do not reflect on the standard of care.

## 2021-02-08 DIAGNOSIS — K8050 Calculus of bile duct without cholangitis or cholecystitis without obstruction: Secondary | ICD-10-CM | POA: Diagnosis not present

## 2021-02-08 DIAGNOSIS — I4891 Unspecified atrial fibrillation: Secondary | ICD-10-CM | POA: Diagnosis not present

## 2021-02-08 LAB — CBC
HCT: 32 % — ABNORMAL LOW (ref 39.0–52.0)
Hemoglobin: 10 g/dL — ABNORMAL LOW (ref 13.0–17.0)
MCH: 29.3 pg (ref 26.0–34.0)
MCHC: 31.3 g/dL (ref 30.0–36.0)
MCV: 93.8 fL (ref 80.0–100.0)
Platelets: 65 10*3/uL — ABNORMAL LOW (ref 150–400)
RBC: 3.41 MIL/uL — ABNORMAL LOW (ref 4.22–5.81)
RDW: 17.2 % — ABNORMAL HIGH (ref 11.5–15.5)
WBC: 5.9 10*3/uL (ref 4.0–10.5)
nRBC: 0 % (ref 0.0–0.2)

## 2021-02-08 LAB — RESP PANEL BY RT-PCR (FLU A&B, COVID) ARPGX2
Influenza A by PCR: NEGATIVE
Influenza B by PCR: NEGATIVE
SARS Coronavirus 2 by RT PCR: NEGATIVE

## 2021-02-08 LAB — CULTURE, BLOOD (ROUTINE X 2): Special Requests: ADEQUATE

## 2021-02-08 MED ORDER — METOPROLOL TARTRATE 25 MG PO TABS
12.5000 mg | ORAL_TABLET | Freq: Two times a day (BID) | ORAL | Status: DC
  Administered 2021-02-08 (×2): 12.5 mg via ORAL
  Filled 2021-02-08 (×3): qty 1

## 2021-02-08 NOTE — Care Management Important Message (Signed)
Important Message  Patient Details  Name: Antonio Phillips MRN: [id number] Date of Birth: 1945-07-21   Medicare Important Message Given:  Yes     Dannette Barbara 02/08/2021, 12:23 PM

## 2021-02-08 NOTE — TOC Progression Note (Signed)
Transition of Care [facility]) - Progression Note    Patient Details  Name: Antonio Phillips MRN: [id number] Date of Birth: 07/18/1945  Transition of Care [facility]) CM/SW [place], [place] Phone Number: 02/08/2021, 10:45 AM  Clinical Narrative:      CSW notes per MD patient to dc to [facility] tomorrow due to chest congestion this morning, Di Kindle has been updated.    Barriers to Discharge: No Barriers Identified  Expected Discharge Plan and Services                                                 Social Determinants of Health (SDOH) Interventions    Readmission Risk Interventions Readmission Risk Prevention Plan 09/10/2019 11/14/2018 11/09/2018  Transportation Screening Complete Complete Complete  PCP or Specialist Appt within 3-5 Days Patient refused Patient refused -  [place] or [place] Patient refused - -  Social Work Consult for [place] Planning/Counseling Complete - -  Palliative Care Screening Not Applicable Not Applicable -  Medication Review (RN Care Manager) Complete Complete Complete  Some recent data might be hidden

## 2021-02-08 NOTE — Progress Notes (Signed)
PROGRESS NOTE    Antonio Phillips  AOZ:[id number] DOB: Aug 13, 1945 DOA: 01/31/2021 PCP: Marsh Dolly, MD   Brief Narrative: Taken from prior notes. Antonio Phillips, 75 y.o. 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 fib on Eliquis, HLD, chronic hypotension on midodrine, morbid obesity who presented from nursing home with complaints of abdominal pain and emesis of 2-3 days duration.    Seen in the ED,cxr pulmonary edema,at least moderate pleural effusion with compressive atelectasis, CT chest abdomen pelvis showed acute pancreatitis, choledocholithiasis 1.4 cm calculus in the distal CBD with biliary duct dilatation suggesting obstruction .Clinically no concerns for acute cholangitis however.  ERCP was advised by Gastroenterology.  Initially canceled due to persistent hypotension, received 2 units of PRBC for hematochezia.  Holding Eliquis and Plavix. Transferred to stepdown due to worsening hypotension, map goal of 55.  Patient underwent successful ERCP with GI, found to have dilated bile duct with 1 stone, removal was not attempted but a stent was placed which needs to be removed in 1 month with another ERCP.  Patient is high risk for deterioration and death based on multiple life limiting comorbidities.  Palliative care was also consulted, initially patient would like to stay as full code but after another discussion today, CODE STATUS changed to DNR with full scope of medical care.  Subjective: Patient was complaining of some shortness of breath when seen today.  Stating that his chest is very congested and he was having some cough.  Remained afebrile.  Assessment & Plan:   Principal Problem:   Abdominal pain Active Problems:   ESRD (end stage renal disease) (HCC)   Atrial fibrillation, chronic (HCC)   Wound of skin   Choledocholithiasis   Morbid obesity (HCC)   Normocytic anemia   Atrial fibrillation with rapid  ventricular response (HCC)  Acute on chronic anemia.  Most likely some GI loss.  Underwent ERCP with stent placement in common bile duct with GI.  Received 2 unit of PRBC.  PPI was initiated.  Hemoglobin improved to 10.1 after getting blood transfusions, currently seems stable with hemoglobin of 10 today.  Might be some element of bone marrow suppression with recent infection.   -Continue to monitor -Transfuse if below 8  Acute biliary pancreatitis secondary to choledocholithiasis.  Lipase greater than 1000 on admission.  This was confirmed on CT scan.  Underwent successful ERCP with GI, found to have severely dilated common bile duct with a bile stone, no attempt was made for stone removal but a stent was placed which needs to be removed in 1 month with a second ERCP. He was placed on Zosyn due to worsening hypotension. Preliminary blood cultures negative. -Continue with Zosyn-we will complete a 7-day course today.  Nursing concern of dark-colored bowel movement.  Hemoglobin currently stable and improving .  Can be due to some bleeding during procedure.  -Monitor for any melena or hematochezia -Monitor hemoglobin  Shortness of breath.  Patient appears little volume up.  Talked with nephrology as he might get benefit from an extra dialysis.  ESRD.  On Tuesday, Thursday and Saturday schedule. -Continue with scheduled dialysis -We will get benefit from an added treatment before discharge as appears volume up.  Acute on chronic thrombocytopenia.  Baseline platelet count around 100.  Today at 62, started improving.   Hematology was consulted and they are recommending 1 unit of platelet before the ERCP and 1 during the  procedure. -Recommending to proceed with platelet transfusion is started bleeding or if counts drop below 15. -Some concern of peripheral destruction but they are not recommending steroid at this time, might need steroid and/or IVIG. -Appreciate hematology recommendations.  Acute  on chronic hypotension.  Blood pressure within goal.  On midodrine at baseline.  Goal MAP of 55. -Continue with midodrine-dose was increased to 10 mg 3 times a day with improvement in blood pressure. -Continue to monitor  Permanent atrial fibrillation.  Cardiology is on board, he was placed on amiodarone.  Initially home dose of Eliquis was held and was restarted yesterday after ERCP and clearance from GI. -Cardiology added low-dose metoprolol and hopefully we can stop IV amiodarone tomorrow. -Continue home dose of Eliquis -Continue to monitor  Right hand pain.  Some edema with no erythema of right hand.  Right hand x-ray with advanced degenerative changes, no fractures. -Supportive care  Left stamped burning pain.  Some improvement after increasing the dose of Lyrica. -Increase the dose of Lyrica from 75 to 100 twice daily.  CAD with severe single-vessel disease.  Medical management per cardiology at this time. -Restarting home dose of Eliquis and Plavix. -Discontinue aspirin  Multiple decubitus ulcers.  Wound care was consulted -Continue with wound care  History of PAD.  S/p bilateral lower extremity amputations. -Continue with statin -Restart home dose of Eliquis and Plavix -Discontinue aspirin  Objective: Vitals:   02/08/21 0336 02/08/21 0744 02/08/21 1137 02/08/21 1513  BP: 101/68 113/78 112/71 120/72  Pulse: (!) 101 (!) 104 (!) 102 92  Resp: 18 20 19 18   Temp: 97.8 F (36.6 C) 97.9 F (36.6 C) 98 F (36.7 C) 97.8 F (36.6 C)  TempSrc: Oral     SpO2: 100% 95% 100% 100%  Weight:      Height:        Intake/Output Summary (Last 24 hours) at 02/08/2021 1532 Last data filed at 02/08/2021 1416 Gross per 24 hour  Intake 1080 ml  Output --  Net 1080 ml    Filed Weights   02/05/21 1200 02/06/21 1202 02/06/21 1543  Weight: 101.8 kg 111.4 kg 110.3 kg    Examination:  General.  Chronically ill-appearing elderly man, in no acute distress. Pulmonary.  Bilateral  crackles, normal respiratory effort. CV.  Regular rate and rhythm, no JVD, rub or murmur. Abdomen.  Soft, nontender, nondistended, BS positive. CNS.  Alert and oriented .  No focal neurologic deficit. Extremities.  Right BKA, left AKA Psychiatry.  Judgment and insight appears normal.   DVT prophylaxis: Eliquis Code Status: DNR Family Communication:  Disposition Plan:  Status is: Inpatient  Remains inpatient appropriate because: Severity of illness   Level of care: Telemetry Cardiac  All the records are reviewed and case discussed with Care Management/Social Worker. Management plans discussed with the patient, nursing and they are in agreement.  Consultants:  PCCM Cardiology GI Nephrology  Procedures:  Antimicrobials:  Zosyn  Data Reviewed: I have personally reviewed following labs and imaging studies  CBC: Recent Labs  Lab 02/02/21 1807 02/03/21 0501 02/03/21 1302 02/04/21 0415 02/05/21 0416 02/06/21 0437 02/07/21 0544 02/08/21 0352  WBC 4.6   < > 6.0 4.0 4.9 6.2 5.2 5.9  NEUTROABS 3.9  --  4.4  --   --   --   --   --   HGB 10.1*   < > 9.6* 9.1* 9.6* 9.3* 9.6* 10.0*  HCT 31.4*   < > 30.2* 28.0* 29.8* 29.1* 30.3* 32.0*  MCV  94.0   < > 94.7 94.3 94.9 93.0 93.8 93.8  PLT 41*   < > 57* 41* 51* 44* 52* 65*   < > = values in this interval not displayed.    Basic Metabolic Panel: Recent Labs  Lab 02/02/21 0030 02/02/21 0647 02/02/21 1807 02/03/21 0501  NA 133* 135 136 133*  K 3.9 4.3 4.4 4.5  CL 96* 98 100 98  CO2 26 26 25 25   GLUCOSE 87 87 85 89  BUN 36* 42* 47* 54*  CREATININE 4.52* 4.75* 5.06* 5.56*  CALCIUM 8.7* 8.4* 8.7* 8.1*  MG 1.4*  --   --  1.8    GFR: Estimated Creatinine Clearance: 14.3 mL/min (A) (by C-G formula based on SCr of 5.56 mg/dL (H)). Liver Function Tests: Recent Labs  Lab 02/02/21 0647 02/02/21 1807 02/03/21 1302 02/05/21 0416  AST 40 45* 34 15  ALT 32 38 30 18  ALKPHOS 423* 457* 364* 268*  BILITOT 3.6* 3.8* 2.6* 1.7*   PROT 5.3* 5.5* 5.5* 5.2*  ALBUMIN 2.8* 2.9* 2.7* 2.4*    No results for input(s): LIPASE, AMYLASE in the last 168 hours.  Recent Labs  Lab 02/02/21 0030  AMMONIA 29    Coagulation Profile: Recent Labs  Lab 02/02/21 0647  INR 1.9*    Cardiac Enzymes: No results for input(s): CKTOTAL, CKMB, CKMBINDEX, TROPONINI in the last 168 hours. BNP (last 3 results) No results for input(s): PROBNP in the last 8760 hours. HbA1C: No results for input(s): HGBA1C in the last 72 hours. CBG: Recent Labs  Lab 02/06/21 1126 02/06/21 2138 02/07/21 0730 02/07/21 1200 02/07/21 1624  GLUCAP 118* 128* 105* 147* 159*    Lipid Profile: No results for input(s): CHOL, HDL, LDLCALC, TRIG, CHOLHDL, LDLDIRECT in the last 72 hours. Thyroid Function Tests: No results for input(s): TSH, T4TOTAL, FREET4, T3FREE, THYROIDAB in the last 72 hours. Anemia Panel: No results for input(s): VITAMINB12, FOLATE, FERRITIN, TIBC, IRON, RETICCTPCT in the last 72 hours.  Sepsis Labs: Recent Labs  Lab 02/02/21 1807 02/02/21 2135 02/03/21 0430 02/04/21 0413  PROCALCITON 21.06  --  17.01 9.28  LATICACIDVEN 1.7 2.1*  --   --      Recent Results (from the past 240 hour(s))  Resp Panel by RT-PCR (Flu A&B, Covid) Nasopharyngeal Swab     Status: None   Collection Time: 01/31/21  9:11 AM   Specimen: Nasopharyngeal Swab; Nasopharyngeal(NP) swabs in vial transport medium  Result Value Ref Range Status   SARS Coronavirus 2 by RT PCR NEGATIVE NEGATIVE Final    Comment: (NOTE) SARS-CoV-2 target nucleic acids are NOT DETECTED.  The SARS-CoV-2 RNA is generally detectable in upper respiratory specimens during the acute phase of infection. The lowest concentration of SARS-CoV-2 viral copies this assay can detect is 138 copies/mL. A negative result does not preclude SARS-Cov-2 infection and should not be used as the sole basis for treatment or other patient management decisions. A negative result may occur with   improper specimen collection/handling, submission of specimen other than nasopharyngeal swab, presence of viral mutation(s) within the areas targeted by this assay, and inadequate number of viral copies(<138 copies/mL). A negative result must be combined with clinical observations, patient history, and epidemiological information. The expected result is Negative.  Fact Sheet for Patients:  EntrepreneurPulse.com.au  Fact Sheet for Healthcare Providers:  IncredibleEmployment.be  This test is no t yet approved or cleared by the [place] FDA and  has been authorized for detection and/or diagnosis of SARS-CoV-2 by  FDA under an Emergency Use Authorization (EUA). This EUA will remain  in effect (meaning this test can be used) for the duration of the COVID-19 declaration under Section 564(b)(1) of the Act, 21 U.S.C.section 360bbb-3(b)(1), unless the authorization is terminated  or revoked sooner.       Influenza A by PCR NEGATIVE NEGATIVE Final   Influenza B by PCR NEGATIVE NEGATIVE Final    Comment: (NOTE) The Xpert Xpress SARS-CoV-2/FLU/RSV plus assay is intended as an aid in the diagnosis of influenza from Nasopharyngeal swab specimens and should not be used as a sole basis for treatment. Nasal washings and aspirates are unacceptable for Xpert Xpress SARS-CoV-2/FLU/RSV testing.  Fact Sheet for Patients: EntrepreneurPulse.com.au  Fact Sheet for Healthcare Providers: IncredibleEmployment.be  This test is not yet approved or cleared by the [place] FDA and has been authorized for detection and/or diagnosis of SARS-CoV-2 by FDA under an Emergency Use Authorization (EUA). This EUA will remain in effect (meaning this test can be used) for the duration of the COVID-19 declaration under Section 564(b)(1) of the Act, 21 U.S.C. section 360bbb-3(b)(1), unless the authorization is terminated  or revoked.  Performed at [facility], [address] [place], [place] [address]   MRSA Next Gen by PCR, Nasal     Status: None   Collection Time: 02/02/21  9:21 AM   Specimen: Nasal Mucosa; Nasal Swab  Result Value Ref Range Status   MRSA by PCR Next Gen NOT DETECTED NOT DETECTED Final    Comment: (NOTE) The GeneXpert MRSA Assay (FDA approved for NASAL specimens only), is one component of a comprehensive MRSA colonization surveillance program. It is not intended to diagnose MRSA infection nor to guide or monitor treatment for MRSA infections. Test performance is not FDA approved in patients less than 63 years old. Performed at [facility], [address] [place], [place] [address]   CULTURE, BLOOD (ROUTINE X 2) w Reflex to ID Panel     Status: Abnormal   Collection Time: 02/02/21  6:07 PM   Specimen: BLOOD  Result Value Ref Range Status   Specimen Description   Final    BLOOD BLOOD RIGHT HAND Performed at [facility], [address] [place], [place] [address]    Special Requests   Final    BOTTLES DRAWN AEROBIC AND ANAEROBIC Blood Culture adequate volume Performed at [facility], [address] [place], [place] [address]    Culture  Setup Time   Final    GRAM NEGATIVE RODS AEROBIC BOTTLE ONLY Organism ID to follow CRITICAL RESULT CALLED TO, READ BACK BY AND VERIFIED WITH: BRANDON BEERS 02/05/21 2049 MU Performed at [facility], [address] [place], [place] [address]    Culture ESCHERICHIA COLI (A)  Final   Report Status 02/08/2021 FINAL  Final   Organism ID, Bacteria ESCHERICHIA COLI  Final      Susceptibility   Escherichia coli - MIC*    AMPICILLIN 16 INTERMEDIATE Intermediate     CEFAZOLIN <=4 SENSITIVE Sensitive     CEFEPIME <=0.12 SENSITIVE Sensitive     CEFTAZIDIME <=1 SENSITIVE Sensitive     CEFTRIAXONE <=0.25 SENSITIVE Sensitive     CIPROFLOXACIN >=4 RESISTANT Resistant     GENTAMICIN <=1 SENSITIVE  Sensitive     IMIPENEM 0.5 SENSITIVE Sensitive     TRIMETH/SULFA >=320 RESISTANT Resistant     AMPICILLIN/SULBACTAM 16 INTERMEDIATE Intermediate     PIP/TAZO 8 SENSITIVE Sensitive     * ESCHERICHIA COLI  CULTURE, BLOOD (ROUTINE X 2) w Reflex to ID Panel     Status: None   Collection Time: 02/02/21  6:07 PM   Specimen: BLOOD  Result Value Ref Range Status   Specimen Description BLOOD BLOOD RIGHT FOREARM  Final   Special Requests   Final    BOTTLES DRAWN AEROBIC AND ANAEROBIC Blood Culture adequate volume   Culture   Final    NO GROWTH 5 DAYS Performed at [facility], [address] [place], [place] [address]    Report Status 02/07/2021 FINAL  Final  Blood Culture ID Panel (Reflexed)     Status: Abnormal   Collection Time: 02/02/21  6:07 PM  Result Value Ref Range Status   Enterococcus faecalis NOT DETECTED NOT DETECTED Final   Enterococcus Faecium NOT DETECTED NOT DETECTED Final   Listeria monocytogenes NOT DETECTED NOT DETECTED Final   Staphylococcus species NOT DETECTED NOT DETECTED Final   Staphylococcus aureus (BCID) NOT DETECTED NOT DETECTED Final   Staphylococcus epidermidis NOT DETECTED NOT DETECTED Final   Staphylococcus lugdunensis NOT DETECTED NOT DETECTED Final   Streptococcus species NOT DETECTED NOT DETECTED Final   Streptococcus agalactiae NOT DETECTED NOT DETECTED Final   Streptococcus pneumoniae NOT DETECTED NOT DETECTED Final   Streptococcus pyogenes NOT DETECTED NOT DETECTED Final   A.calcoaceticus-baumannii NOT DETECTED NOT DETECTED Final   Bacteroides fragilis NOT DETECTED NOT DETECTED Final   Enterobacterales DETECTED (A) NOT DETECTED Final    Comment: Enterobacterales represent a large order of gram negative bacteria, not a single organism. CRITICAL RESULT CALLED TO, READ BACK BY AND VERIFIED WITH: BRANDOM BEERS @2049  ON 02/05/21 MU    Enterobacter cloacae complex NOT DETECTED NOT DETECTED Final   Escherichia coli DETECTED (A) NOT DETECTED Final     Comment: CRITICAL RESULT CALLED TO, READ BACK BY AND VERIFIED WITH: BRANDOM BEERS 02/05/21 2049 MU    Klebsiella aerogenes NOT DETECTED NOT DETECTED Final   Klebsiella oxytoca NOT DETECTED NOT DETECTED Final   Klebsiella pneumoniae NOT DETECTED NOT DETECTED Final   Proteus species NOT DETECTED NOT DETECTED Final   Salmonella species NOT DETECTED NOT DETECTED Final   Serratia marcescens NOT DETECTED NOT DETECTED Final   Haemophilus influenzae NOT DETECTED NOT DETECTED Final   Neisseria meningitidis NOT DETECTED NOT DETECTED Final   Pseudomonas aeruginosa NOT DETECTED NOT DETECTED Final   Stenotrophomonas maltophilia NOT DETECTED NOT DETECTED Final   Candida albicans NOT DETECTED NOT DETECTED Final   Candida auris NOT DETECTED NOT DETECTED Final   Candida glabrata NOT DETECTED NOT DETECTED Final   Candida krusei NOT DETECTED NOT DETECTED Final   Candida parapsilosis NOT DETECTED NOT DETECTED Final   Candida tropicalis NOT DETECTED NOT DETECTED Final   Cryptococcus neoformans/gattii NOT DETECTED NOT DETECTED Final   CTX-M ESBL NOT DETECTED NOT DETECTED Final   Carbapenem resistance IMP NOT DETECTED NOT DETECTED Final   Carbapenem resistance KPC NOT DETECTED NOT DETECTED Final   Carbapenem resistance NDM NOT DETECTED NOT DETECTED Final   Carbapenem resist OXA 48 LIKE NOT DETECTED NOT DETECTED Final   Carbapenem resistance VIM NOT DETECTED NOT DETECTED Final    Comment: Performed at [facility], [address] [place], [place] [address]  Resp Panel by RT-PCR (Flu A&B, Covid) Nasopharyngeal Swab     Status: None   Collection Time: 02/08/21 10:40 AM   Specimen: Nasopharyngeal Swab; Nasopharyngeal(NP) swabs in vial transport medium  Result Value Ref Range Status   SARS Coronavirus 2 by RT PCR  NEGATIVE NEGATIVE Final    Comment: (NOTE) SARS-CoV-2 target nucleic acids are NOT DETECTED.  The SARS-CoV-2 RNA is generally detectable in upper respiratory specimens during the acute  phase of infection. The lowest concentration of SARS-CoV-2 viral copies this assay can detect is 138 copies/mL. A negative result does not preclude SARS-Cov-2 infection and should not be used as the sole basis for treatment or other patient management decisions. A negative result may occur with  improper specimen collection/handling, submission of specimen other than nasopharyngeal swab, presence of viral mutation(s) within the areas targeted by this assay, and inadequate number of viral copies(<138 copies/mL). A negative result must be combined with clinical observations, patient history, and epidemiological information. The expected result is Negative.  Fact Sheet for Patients:  EntrepreneurPulse.com.au  Fact Sheet for Healthcare Providers:  IncredibleEmployment.be  This test is no t yet approved or cleared by the [place] FDA and  has been authorized for detection and/or diagnosis of SARS-CoV-2 by FDA under an Emergency Use Authorization (EUA). This EUA will remain  in effect (meaning this test can be used) for the duration of the COVID-19 declaration under Section 564(b)(1) of the Act, 21 U.S.C.section 360bbb-3(b)(1), unless the authorization is terminated  or revoked sooner.       Influenza A by PCR NEGATIVE NEGATIVE Final   Influenza B by PCR NEGATIVE NEGATIVE Final    Comment: (NOTE) The Xpert Xpress SARS-CoV-2/FLU/RSV plus assay is intended as an aid in the diagnosis of influenza from Nasopharyngeal swab specimens and should not be used as a sole basis for treatment. Nasal washings and aspirates are unacceptable for Xpert Xpress SARS-CoV-2/FLU/RSV testing.  Fact Sheet for Patients: EntrepreneurPulse.com.au  Fact Sheet for Healthcare Providers: IncredibleEmployment.be  This test is not yet approved or cleared by the [place] FDA and has been authorized for detection and/or diagnosis of  SARS-CoV-2 by FDA under an Emergency Use Authorization (EUA). This EUA will remain in effect (meaning this test can be used) for the duration of the COVID-19 declaration under Section 564(b)(1) of the Act, 21 U.S.C. section 360bbb-3(b)(1), unless the authorization is terminated or revoked.  Performed at [facility], [address] [place],  [address]       Radiology Studies: No results found.  Scheduled Meds:  sodium chloride   Intravenous Once   apixaban  5 mg Oral BID   atorvastatin  40 mg Oral QHS   chlorhexidine  15 mL Mouth Rinse BID   Chlorhexidine Gluconate Cloth  6 each Topical Q0600   clopidogrel  75 mg Oral Q breakfast   clotrimazole  1 application Topical BID   dextromethorphan-guaiFENesin  1 tablet Oral BID   diclofenac Sodium  2 g Topical q AM   epoetin (EPOGEN/PROCRIT) injection  10,000 Units Intravenous Q T,Th,Sa-HD   finasteride  5 mg Oral Daily   loratadine  10 mg Oral Once per day on Sun Mon Wed Fri   mouth rinse  15 mL Mouth Rinse q12n4p   metoprolol tartrate  12.5 mg Oral BID   midodrine  10 mg Oral TID WC   pantoprazole (PROTONIX) IV  40 mg Intravenous Q12H   pregabalin  100 mg Oral BID   rOPINIRole  1 mg Oral QHS   sevelamer carbonate  1,600 mg Oral 2 times per day on Tue Thu Sat   sevelamer carbonate  1,600 mg Oral 3 times per day on Sun Mon Wed Fri   vitamin B-12  1,000 mcg Oral Daily   Continuous  Infusions:  sodium chloride     sodium chloride     amiodarone 30 mg/hr (02/08/21 0854)   piperacillin-tazobactam (ZOSYN)  IV 2.25 g (02/08/21 1318)     LOS: 8 days   Time spent: 42 minutes. More than 50% of the time was spent in counseling/coordination of care  Lorella Nimrod, MD [facility]  If 7PM-7AM, please contact night-coverage [email or web address]  02/08/2021, 3:32 PM   This record has been created using Systems analyst. Errors have been sought and corrected,but may not always be located. Such  creation errors do not reflect on the standard of care.

## 2021-02-08 NOTE — Progress Notes (Signed)
Progress Note  Patient Name: Antonio Phillips Date of Encounter: 02/08/2021  Primary Cardiologist: Rockey Situ  Subjective   No chest pain or palpitations. Cough persists and is largely unchanged. He feels like he is holding onto fluid. Due for HD tomorrow. BP soft, though stable.   Inpatient Medications    Scheduled Meds:  sodium chloride   Intravenous Once   apixaban  5 mg Oral BID   atorvastatin  40 mg Oral QHS   chlorhexidine  15 mL Mouth Rinse BID   Chlorhexidine Gluconate Cloth  6 each Topical Q0600   clopidogrel  75 mg Oral Q breakfast   clotrimazole  1 application Topical BID   dextromethorphan-guaiFENesin  1 tablet Oral BID   diclofenac Sodium  2 g Topical q AM   epoetin (EPOGEN/PROCRIT) injection  10,000 Units Intravenous Q T,Th,Sa-HD   finasteride  5 mg Oral Daily   loratadine  10 mg Oral Once per day on Sun Mon Wed Fri   mouth rinse  15 mL Mouth Rinse q12n4p   midodrine  10 mg Oral TID WC   pantoprazole (PROTONIX) IV  40 mg Intravenous Q12H   pregabalin  100 mg Oral BID   rOPINIRole  1 mg Oral QHS   sevelamer carbonate  1,600 mg Oral 2 times per day on Tue Thu Sat   sevelamer carbonate  1,600 mg Oral 3 times per day on Sun Mon Wed Fri   vitamin B-12  1,000 mcg Oral Daily   Continuous Infusions:  sodium chloride     sodium chloride     amiodarone 30 mg/hr (02/08/21 0854)   piperacillin-tazobactam (ZOSYN)  IV 2.25 g (02/08/21 0556)   PRN Meds: sodium chloride, sodium chloride, acetaminophen, alteplase, heparin, HYDROcodone-acetaminophen, ipratropium-albuterol, lactulose, lidocaine (PF), lidocaine-prilocaine, loperamide, morphine injection, pentafluoroprop-tetrafluoroeth, polyvinyl alcohol   Vital Signs    Vitals:   02/08/21 0022 02/08/21 0336 02/08/21 0744 02/08/21 1137  BP: 105/67 101/68 113/78 112/71  Pulse: 89 (!) 101 (!) 104 (!) 102  Resp: 18 18 20 19   Temp: 98.4 F (36.9 C) 97.8 F (36.6 C) 97.9 F (36.6 C) 98 F (36.7 C)  TempSrc: Oral Oral     SpO2: 100% 100% 95% 100%  Weight:      Height:        Intake/Output Summary (Last 24 hours) at 02/08/2021 1232 Last data filed at 02/08/2021 1043 Gross per 24 hour  Intake 1350 ml  Output --  Net 1350 ml   Filed Weights   02/05/21 1200 02/06/21 1202 02/06/21 1543  Weight: 101.8 kg 111.4 kg 110.3 kg    Telemetry    Afib with ventricular rates in the 90s to low 100s bpm - Personally Reviewed  ECG    No new tracings - Personally Reviewed  Physical Exam   GEN: No acute distress.   Neck: JVD difficult to assess secondary to body habitus. Cardiac: IRIR, no murmurs, rubs, or gallops.  Respiratory: Diminished and coarse breath sounds, particularly along the bases bilaterally.  GI: Soft, nontender, non-distended.   MS: Trace to 1+ bilateral lower extremity/stump edema; status post bilateral BKA. Neuro:  Alert and oriented x 3; Nonfocal.  Psych: Normal affect.  Labs    Chemistry Recent Labs  Lab 02/02/21 0647 02/02/21 1807 02/03/21 0501 02/03/21 1302 02/05/21 0416  NA 135 136 133*  --   --   K 4.3 4.4 4.5  --   --   CL 98 100 98  --   --  CO2 26 25 25   --   --   GLUCOSE 87 85 89  --   --   BUN 42* 47* 54*  --   --   CREATININE 4.75* 5.06* 5.56*  --   --   CALCIUM 8.4* 8.7* 8.1*  --   --   PROT 5.3* 5.5*  --  5.5* 5.2*  ALBUMIN 2.8* 2.9*  --  2.7* 2.4*  AST 40 45*  --  34 15  ALT 32 38  --  30 18  ALKPHOS 423* 457*  --  364* 268*  BILITOT 3.6* 3.8*  --  2.6* 1.7*  GFRNONAA 12* 11* 10*  --   --   ANIONGAP 11 11 10   --   --      Hematology Recent Labs  Lab 02/06/21 0437 02/07/21 0544 02/08/21 0352  WBC 6.2 5.2 5.9  RBC 3.13* 3.23* 3.41*  HGB 9.3* 9.6* 10.0*  HCT 29.1* 30.3* 32.0*  MCV 93.0 93.8 93.8  MCH 29.7 29.7 29.3  MCHC 32.0 31.7 31.3  RDW 17.2* 17.2* 17.2*  PLT 44* 52* 65*    Cardiac EnzymesNo results for input(s): TROPONINI in the last 168 hours. No results for input(s): TROPIPOC in the last 168 hours.   BNPNo results for input(s): BNP,  PROBNP in the last 168 hours.   DDimer No results for input(s): DDIMER in the last 168 hours.   Radiology    No results found.  Cardiac Studies   Limited echo 09/2020: 1. Left ventricular ejection fraction, by estimation, is 45 to 50%. The  left ventricle has mildly decreased function. Left ventricular endocardial  border not optimally defined to evaluate regional wall motion. There is  mild left ventricular hypertrophy.   2. Right ventricular systolic function is moderately reduced. The right  ventricular size is normal. Mildly increased right ventricular wall  thickness. There is moderately elevated pulmonary artery systolic  pressure.   3. The mitral valve is abnormal. Trivial mitral valve regurgitation.   4. The aortic valve is tricuspid. There is mild thickening of the aortic  valve.   5. The inferior vena cava is dilated in size with <50% respiratory  variability, suggesting right atrial pressure of 15 mmHg.  __________  Limited echo 09/2020: 1. Poor acoustic windows Endocardium is not well seen I would recomm  limited echo with Definity to help define LVEF and regional wall motion. .  There is mild left ventricular hypertrophy. Left ventricular diastolic  parameters are indeterminate.   2. RV is not seen well enough to evaluate function. . The right  ventricular size is mildly enlarged.   3. Left atrial size was severely dilated.   4. Right atrial size was severely dilated.   5. Mild mitral valve regurgitation.   6. Tricuspid valve regurgitation is mild to moderate.   7. The aortic valve is tricuspid. Aortic valve regurgitation is not  visualized. Mild aortic valve sclerosis is present, with no evidence of  aortic valve stenosis. __________  LHC 09/2020:   Ost LAD to Prox LAD lesion is 50% stenosed.   2nd Mrg lesion is 99% stenosed.   A drug-eluting stent was successfully placed using a STENT RESOLUTE ONYX 2.5X15.   Post intervention, there is a 30% residual  stenosis.   1.  Severe single-vessel coronary artery disease with 99% subtotal stenosis of the second obtuse marginal branch, treated with a 2.5 x 15 mm resolute Onyx DES.  30% residual stenosis at the case completion due to  resistant/calcified lesion type even with high-pressure noncompliant balloon angioplasty. 2.  Mild to moderate nonobstructive proximal LAD stenosis 3.  Mild nonobstructive RCA stenosis  Recommendations: Aggressive medical therapy, as long as no bleeding complications arise, would resume apixaban tomorrow, aspirin 81 mg daily x30 days, and clopidogrel 75 mg daily x minimum 6 months. __________  2D echo 09/2020: 1. Left ventricular ejection fraction, by estimation, is 60 to 65%. The  left ventricle has normal function. The left ventricle has no regional  wall motion abnormalities. Left ventricular diastolic parameters are  indeterminate.   2. Right ventricular systolic function is normal. The right ventricular  size is normal. Tricuspid regurgitation signal is inadequate for assessing  PA pressure.   3. Left atrial size was moderately dilated.   4. Right atrial size was moderately dilated.   5. Tricuspid valve regurgitation is mild to moderate.   6. The mitral valve is normal in structure. Mild mitral valve  regurgitation.   7. Rhythym is atrial fibrillation   Patient Profile     75 y.o. 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 fib on Eliquis, ESRD on HD, hypotension, DM2, anemia of chronic disease, morbid obesity, and demand ischemia who was admitted on 11/29 with abdominal pain and gallstone pancreatitis status post ERCP and stenting to the CBD on 50/0 complicated by GNR bacteremia who we are seeing for elevated troponin.  Assessment & Plan    1.  CAD involving the native coronary arteries with demand ischemia: -No angina  -Moderate high-sensitivity troponin elevation, peaking at 397, in the  setting of gallstone pancreatitis/sepsis/hypotension/ESRD/acute on chronic anemia -Now back on clopidogrel, with interventional cardiology recommendation this to be continued for a minimum of 6 months dating back to date of PCI (09/22/2020) -Statin -No plans for inpatient ischemic evaluation at this time  2.  Permanent A. fib: -Ventricular rates reasonably controlled  -He remains on IV amiodarone, for rate control, in the setting of beta-blocker being held for hypotension -Hopefully over the next 24 hours we will be able to discontinue IV amiodarone and place him back on low-dose beta-blocker -CHA2DS2-VASc at least 5 (CHF, age x2, DM2, vascular disease) -Initially Eliquis was held, though this was restarted on 12/2 after clearance from GI  3.  HFmrEF: -Volume up -EF 45 to 50% by echo in 09/2020 -Beta-blocker on hold in the setting of hypotension requiring midodrine -Resume/escalate GDMT as able as he continues to distance himself from acute illness -Volume management per nephrology/HD  4.  Gallstone pancreatitis: -Status post ERCP with stenting on 12/one of the CBD with plan for follow-up ERCP and stent removal in 1 month -Management per GI  5.  Hypotension: -BP soft, though stable in the low 938H systolic -Remains on midodrine  6.  ESRD: -HD per nephrology  7.  Acute on chronic anemia: -Felt to be GI loss -Status post 2 units PRBC -PPI -Stable -Monitor on [place] [facility]  8. Cough: -Largely unchanged -ABX, particularly in the setting of bacteremia, per IM -Fluid management per nephrology      For questions or updates, please contact [facility] Please consult [email or web address] for contact info under Cardiology/STEMI.    Signed, Christell Faith, PA-C [place] Pager: [phone] 02/08/2021, 12:32 PM

## 2021-02-08 NOTE — Progress Notes (Signed)
[facility]  ROUNDING NOTE   Subjective:   Antonio Phillips is a 75 year old male with past medical history including hypotension, obesity, peripheral artery disease, diabetes, hyperlipidemia, and end-stage renal disease on dialysis.  Patient presents to emergency department with abdominal pain persisting for 2 days.  Patient admitted for further evaluation for Choledocholithiasis [K80.50] Pain [R52]  Patient is known to our practice for previous admissions and receives outpatient dialysis at [facility], supervised by Dr. Candiss Norse.  He receives dialysis on a Tuesday Thursday Saturday schedule.    Patient seen sitting up in bed, eating breakfast Tolerating meals without nausea or vomiting Remains on 2 L oxygen States he continues to have trouble breathing when laying flat.    Objective:  Vital signs in last 24 hours:  Temp:  [97.8 F (36.6 C)-98.6 F (37 C)] 98 F (36.7 C) (12/05 1137) Pulse Rate:  [89-106] 102 (12/05 1137) Resp:  [16-20] 19 (12/05 1137) BP: (101-113)/(67-78) 112/71 (12/05 1137) SpO2:  [95 %-100 %] 100 % (12/05 1137)  Weight change:  Filed Weights   02/05/21 1200 02/06/21 1202 02/06/21 1543  Weight: 101.8 kg 111.4 kg 110.3 kg    Intake/Output: I/O last 3 completed shifts: In: 1350 [P.O.:1200; IV Piggyback:150] Out: -    Intake/Output this shift:  Total I/O In: 600 [P.O.:600] Out: -   Physical Exam: General: NAD, resting comfortably  Head: Normocephalic, atraumatic. Moist oral mucosal membranes  Eyes: Anicteric  Lungs:  Bilateral crackles normal effort, O2 2 L  Heart: Irregular  Abdomen:  Soft, tender, mild distention  Extremities: No peripheral edema.  Right BKA left AKA  Neurologic: Nonfocal, moving all four extremities  Skin: No lesions  Access: Left AVF    Basic Metabolic Panel: Recent Labs  Lab 02/02/21 0030 02/02/21 0647 02/02/21 1807 02/03/21 0501  NA 133* 135 136 133*  K 3.9 4.3 4.4 4.5  CL 96* 98 100 98  CO2  26 26 25 25   GLUCOSE 87 87 85 89  BUN 36* 42* 47* 54*  CREATININE 4.52* 4.75* 5.06* 5.56*  CALCIUM 8.7* 8.4* 8.7* 8.1*  MG 1.4*  --   --  1.8     Liver Function Tests: Recent Labs  Lab 02/02/21 0647 02/02/21 1807 02/03/21 1302 02/05/21 0416  AST 40 45* 34 15  ALT 32 38 30 18  ALKPHOS 423* 457* 364* 268*  BILITOT 3.6* 3.8* 2.6* 1.7*  PROT 5.3* 5.5* 5.5* 5.2*  ALBUMIN 2.8* 2.9* 2.7* 2.4*    No results for input(s): LIPASE, AMYLASE in the last 168 hours.  Recent Labs  Lab 02/02/21 0030  AMMONIA 29     CBC: Recent Labs  Lab 02/02/21 1807 02/03/21 0501 02/03/21 1302 02/04/21 0415 02/05/21 0416 02/06/21 0437 02/07/21 0544 02/08/21 0352  WBC 4.6   < > 6.0 4.0 4.9 6.2 5.2 5.9  NEUTROABS 3.9  --  4.4  --   --   --   --   --   HGB 10.1*   < > 9.6* 9.1* 9.6* 9.3* 9.6* 10.0*  HCT 31.4*   < > 30.2* 28.0* 29.8* 29.1* 30.3* 32.0*  MCV 94.0   < > 94.7 94.3 94.9 93.0 93.8 93.8  PLT 41*   < > 57* 41* 51* 44* 52* 65*   < > = values in this interval not displayed.     Cardiac Enzymes: No results for input(s): CKTOTAL, CKMB, CKMBINDEX, TROPONINI in the last 168 hours.  BNP: Invalid input(s): POCBNP  CBG: Recent Labs  Lab 02/06/21 1126 02/06/21 2138 02/07/21 0730 02/07/21 1200 02/07/21 1624  GLUCAP 118* 128* 105* 147* 159*     Microbiology: Results for orders placed or performed during the hospital encounter of 01/31/21  Resp Panel by RT-PCR (Flu A&B, Covid) Nasopharyngeal Swab     Status: None   Collection Time: 01/31/21  9:11 AM   Specimen: Nasopharyngeal Swab; Nasopharyngeal(NP) swabs in vial transport medium  Result Value Ref Range Status   SARS Coronavirus 2 by RT PCR NEGATIVE NEGATIVE Final    Comment: (NOTE) SARS-CoV-2 target nucleic acids are NOT DETECTED.  The SARS-CoV-2 RNA is generally detectable in upper respiratory specimens during the acute phase of infection. The lowest concentration of SARS-CoV-2 viral copies this assay can detect is 138  copies/mL. A negative result does not preclude SARS-Cov-2 infection and should not be used as the sole basis for treatment or other patient management decisions. A negative result may occur with  improper specimen collection/handling, submission of specimen other than nasopharyngeal swab, presence of viral mutation(s) within the areas targeted by this assay, and inadequate number of viral copies(<138 copies/mL). A negative result must be combined with clinical observations, patient history, and epidemiological information. The expected result is Negative.  Fact Sheet for Patients:  EntrepreneurPulse.com.au  Fact Sheet for Healthcare Providers:  IncredibleEmployment.be  This test is no t yet approved or cleared by the [place] FDA and  has been authorized for detection and/or diagnosis of SARS-CoV-2 by FDA under an Emergency Use Authorization (EUA). This EUA will remain  in effect (meaning this test can be used) for the duration of the COVID-19 declaration under Section 564(b)(1) of the Act, 21 U.S.C.section 360bbb-3(b)(1), unless the authorization is terminated  or revoked sooner.       Influenza A by PCR NEGATIVE NEGATIVE Final   Influenza B by PCR NEGATIVE NEGATIVE Final    Comment: (NOTE) The Xpert Xpress SARS-CoV-2/FLU/RSV plus assay is intended as an aid in the diagnosis of influenza from Nasopharyngeal swab specimens and should not be used as a sole basis for treatment. Nasal washings and aspirates are unacceptable for Xpert Xpress SARS-CoV-2/FLU/RSV testing.  Fact Sheet for Patients: EntrepreneurPulse.com.au  Fact Sheet for Healthcare Providers: IncredibleEmployment.be  This test is not yet approved or cleared by the [place] FDA and has been authorized for detection and/or diagnosis of SARS-CoV-2 by FDA under an Emergency Use Authorization (EUA). This EUA will remain in effect (meaning  this test can be used) for the duration of the COVID-19 declaration under Section 564(b)(1) of the Act, 21 U.S.C. section 360bbb-3(b)(1), unless the authorization is terminated or revoked.  Performed at [facility], [address] [place], [place] [address]   MRSA Next Gen by PCR, Nasal     Status: None   Collection Time: 02/02/21  9:21 AM   Specimen: Nasal Mucosa; Nasal Swab  Result Value Ref Range Status   MRSA by PCR Next Gen NOT DETECTED NOT DETECTED Final    Comment: (NOTE) The GeneXpert MRSA Assay (FDA approved for NASAL specimens only), is one component of a comprehensive MRSA colonization surveillance program. It is not intended to diagnose MRSA infection nor to guide or monitor treatment for MRSA infections. Test performance is not FDA approved in patients less than 38 years old. Performed at [facility], [address] [place], [place] [address]   CULTURE, BLOOD (ROUTINE X 2) w Reflex to ID Panel     Status: Abnormal   Collection Time: 02/02/21  6:07 PM  Specimen: BLOOD  Result Value Ref Range Status   Specimen Description   Final    BLOOD BLOOD RIGHT HAND Performed at [facility], [address] [place], [place] [address]    Special Requests   Final    BOTTLES DRAWN AEROBIC AND ANAEROBIC Blood Culture adequate volume Performed at [facility], [address] [place], [place] [address]    Culture  Setup Time   Final    GRAM NEGATIVE RODS AEROBIC BOTTLE ONLY Organism ID to follow CRITICAL RESULT CALLED TO, READ BACK BY AND VERIFIED WITH: BRANDON BEERS 02/05/21 2049 MU Performed at [facility], [address] [place], [place] [address]    Culture ESCHERICHIA COLI (A)  Final   Report Status 02/08/2021 FINAL  Final   Organism ID, Bacteria ESCHERICHIA COLI  Final      Susceptibility   Escherichia coli - MIC*    AMPICILLIN 16 INTERMEDIATE Intermediate     CEFAZOLIN <=4 SENSITIVE Sensitive     CEFEPIME  <=0.12 SENSITIVE Sensitive     CEFTAZIDIME <=1 SENSITIVE Sensitive     CEFTRIAXONE <=0.25 SENSITIVE Sensitive     CIPROFLOXACIN >=4 RESISTANT Resistant     GENTAMICIN <=1 SENSITIVE Sensitive     IMIPENEM 0.5 SENSITIVE Sensitive     TRIMETH/SULFA >=320 RESISTANT Resistant     AMPICILLIN/SULBACTAM 16 INTERMEDIATE Intermediate     PIP/TAZO 8 SENSITIVE Sensitive     * ESCHERICHIA COLI  CULTURE, BLOOD (ROUTINE X 2) w Reflex to ID Panel     Status: None   Collection Time: 02/02/21  6:07 PM   Specimen: BLOOD  Result Value Ref Range Status   Specimen Description BLOOD BLOOD RIGHT FOREARM  Final   Special Requests   Final    BOTTLES DRAWN AEROBIC AND ANAEROBIC Blood Culture adequate volume   Culture   Final    NO GROWTH 5 DAYS Performed at [facility], [address] [place],  [address]    Report Status 02/07/2021 FINAL  Final  Blood Culture ID Panel (Reflexed)     Status: Abnormal   Collection Time: 02/02/21  6:07 PM  Result Value Ref Range Status   Enterococcus faecalis NOT DETECTED NOT DETECTED Final   Enterococcus Faecium NOT DETECTED NOT DETECTED Final   Listeria monocytogenes NOT DETECTED NOT DETECTED Final   Staphylococcus species NOT DETECTED NOT DETECTED Final   Staphylococcus aureus (BCID) NOT DETECTED NOT DETECTED Final   Staphylococcus epidermidis NOT DETECTED NOT DETECTED Final   Staphylococcus lugdunensis NOT DETECTED NOT DETECTED Final   Streptococcus species NOT DETECTED NOT DETECTED Final   Streptococcus agalactiae NOT DETECTED NOT DETECTED Final   Streptococcus pneumoniae NOT DETECTED NOT DETECTED Final   Streptococcus pyogenes NOT DETECTED NOT DETECTED Final   A.calcoaceticus-baumannii NOT DETECTED NOT DETECTED Final   Bacteroides fragilis NOT DETECTED NOT DETECTED Final   Enterobacterales DETECTED (A) NOT DETECTED Final    Comment: Enterobacterales represent a large order of gram negative bacteria, not a single organism. CRITICAL RESULT CALLED TO,  READ BACK BY AND VERIFIED WITH: BRANDOM BEERS @2049  ON 02/05/21 MU    Enterobacter cloacae complex NOT DETECTED NOT DETECTED Final   Escherichia coli DETECTED (A) NOT DETECTED Final    Comment: CRITICAL RESULT CALLED TO, READ BACK BY AND VERIFIED WITH: BRANDOM BEERS 02/05/21 2049 MU    Klebsiella aerogenes NOT DETECTED NOT DETECTED Final   Klebsiella oxytoca NOT DETECTED NOT DETECTED Final   Klebsiella pneumoniae NOT DETECTED NOT DETECTED Final  Proteus species NOT DETECTED NOT DETECTED Final   Salmonella species NOT DETECTED NOT DETECTED Final   Serratia marcescens NOT DETECTED NOT DETECTED Final   Haemophilus influenzae NOT DETECTED NOT DETECTED Final   Neisseria meningitidis NOT DETECTED NOT DETECTED Final   Pseudomonas aeruginosa NOT DETECTED NOT DETECTED Final   Stenotrophomonas maltophilia NOT DETECTED NOT DETECTED Final   Candida albicans NOT DETECTED NOT DETECTED Final   Candida auris NOT DETECTED NOT DETECTED Final   Candida glabrata NOT DETECTED NOT DETECTED Final   Candida krusei NOT DETECTED NOT DETECTED Final   Candida parapsilosis NOT DETECTED NOT DETECTED Final   Candida tropicalis NOT DETECTED NOT DETECTED Final   Cryptococcus neoformans/gattii NOT DETECTED NOT DETECTED Final   CTX-M ESBL NOT DETECTED NOT DETECTED Final   Carbapenem resistance IMP NOT DETECTED NOT DETECTED Final   Carbapenem resistance KPC NOT DETECTED NOT DETECTED Final   Carbapenem resistance NDM NOT DETECTED NOT DETECTED Final   Carbapenem resist OXA 48 LIKE NOT DETECTED NOT DETECTED Final   Carbapenem resistance VIM NOT DETECTED NOT DETECTED Final    Comment: Performed at [facility], [address] [place], [place] [address]  Resp Panel by RT-PCR (Flu A&B, Covid) Nasopharyngeal Swab     Status: None   Collection Time: 02/08/21 10:40 AM   Specimen: Nasopharyngeal Swab; Nasopharyngeal(NP) swabs in vial transport medium  Result Value Ref Range Status   SARS Coronavirus 2 by RT PCR  NEGATIVE NEGATIVE Final    Comment: (NOTE) SARS-CoV-2 target nucleic acids are NOT DETECTED.  The SARS-CoV-2 RNA is generally detectable in upper respiratory specimens during the acute phase of infection. The lowest concentration of SARS-CoV-2 viral copies this assay can detect is 138 copies/mL. A negative result does not preclude SARS-Cov-2 infection and should not be used as the sole basis for treatment or other patient management decisions. A negative result may occur with  improper specimen collection/handling, submission of specimen other than nasopharyngeal swab, presence of viral mutation(s) within the areas targeted by this assay, and inadequate number of viral copies(<138 copies/mL). A negative result must be combined with clinical observations, patient history, and epidemiological information. The expected result is Negative.  Fact Sheet for Patients:  EntrepreneurPulse.com.au  Fact Sheet for Healthcare Providers:  IncredibleEmployment.be  This test is no t yet approved or cleared by the [place] FDA and  has been authorized for detection and/or diagnosis of SARS-CoV-2 by FDA under an Emergency Use Authorization (EUA). This EUA will remain  in effect (meaning this test can be used) for the duration of the COVID-19 declaration under Section 564(b)(1) of the Act, 21 U.S.C.section 360bbb-3(b)(1), unless the authorization is terminated  or revoked sooner.       Influenza A by PCR NEGATIVE NEGATIVE Final   Influenza B by PCR NEGATIVE NEGATIVE Final    Comment: (NOTE) The Xpert Xpress SARS-CoV-2/FLU/RSV plus assay is intended as an aid in the diagnosis of influenza from Nasopharyngeal swab specimens and should not be used as a sole basis for treatment. Nasal washings and aspirates are unacceptable for Xpert Xpress SARS-CoV-2/FLU/RSV testing.  Fact Sheet for Patients: EntrepreneurPulse.com.au  Fact Sheet for  Healthcare Providers: IncredibleEmployment.be  This test is not yet approved or cleared by the [place] FDA and has been authorized for detection and/or diagnosis of SARS-CoV-2 by FDA under an Emergency Use Authorization (EUA). This EUA will remain in effect (meaning this test can be used) for the duration of the COVID-19 declaration under Section 564(b)(1) of the  Act, 21 U.S.C. section 360bbb-3(b)(1), unless the authorization is terminated or revoked.  Performed at [facility], [address] [place], [place] [address]     Coagulation Studies: No results for input(s): LABPROT, INR in the last 72 hours.   Urinalysis: No results for input(s): COLORURINE, LABSPEC, PHURINE, GLUCOSEU, HGBUR, BILIRUBINUR, KETONESUR, PROTEINUR, UROBILINOGEN, NITRITE, LEUKOCYTESUR in the last 72 hours.  Invalid input(s): APPERANCEUR    Imaging: No results found.   Medications:    sodium chloride     sodium chloride     amiodarone 30 mg/hr (02/08/21 0854)   piperacillin-tazobactam (ZOSYN)  IV 2.25 g (02/08/21 1318)    sodium chloride   Intravenous Once   apixaban  5 mg Oral BID   atorvastatin  40 mg Oral QHS   chlorhexidine  15 mL Mouth Rinse BID   Chlorhexidine Gluconate Cloth  6 each Topical Q0600   clopidogrel  75 mg Oral Q breakfast   clotrimazole  1 application Topical BID   dextromethorphan-guaiFENesin  1 tablet Oral BID   diclofenac Sodium  2 g Topical q AM   epoetin (EPOGEN/PROCRIT) injection  10,000 Units Intravenous Q T,Th,Sa-HD   finasteride  5 mg Oral Daily   loratadine  10 mg Oral Once per day on Sun Mon Wed Fri   mouth rinse  15 mL Mouth Rinse q12n4p   metoprolol tartrate  12.5 mg Oral BID   midodrine  10 mg Oral TID WC   pantoprazole (PROTONIX) IV  40 mg Intravenous Q12H   pregabalin  100 mg Oral BID   rOPINIRole  1 mg Oral QHS   sevelamer carbonate  1,600 mg Oral 2 times per day on Tue Thu Sat   sevelamer carbonate  1,600 mg Oral 3  times per day on Sun Mon Wed Fri   vitamin B-12  1,000 mcg Oral Daily     Assessment/ Plan:  [name] is a 75 y.o.  male with past medical history including hypotension, obesity, peripheral artery disease, diabetes, hyperlipidemia, and end-stage renal disease on dialysis.  Patient presents to emergency department with abdominal pain persisting for 2 days.  Patient admitted for further evaluation for Choledocholithiasis [K80.50] Pain [R52]  [facility] Calcium/TTS/Lt AVF/96kg   End-stage renal disease with hyperkalemia on dialysis. Patient has dry weight of 96 kg, blood current charted weight is 110 kg. Patient will discuss received sequential only treatment today.  Next treatment will be tomorrow, to maintain outpatient schedule.  2. Anemia of chronic kidney disease Lab Results  Component Value Date   HGB 10.0 (L) 02/08/2021    Hemoglobin currently at goal.  We will decrease EPO to 4000 units with treatments.    3. Secondary Hyperparathyroidism: Lab Results  Component Value Date   CALCIUM 8.1 (L) 02/03/2021   PHOS 8.0 (H) 02/01/2021  We will continue to monitor bone mineral metabolism during this admission.  Continue Renvela with meals.    LOS: [place] 12/5/20222:39 PM

## 2021-02-08 NOTE — TOC Transition Note (Signed)
Transition of Care [facility]) - CM/SW Discharge Note   Patient Details  Name: QUINTAVIUS NIEBUHR MRN: [id number] Date of Birth: Aug 16, 1945  Transition of Care [facility]) CM/SW Contact:  Alberteen Sam, LCSW Phone Number:    Clinical Narrative:     Patient will DC to: [facility] Anticipated DC date: 02/08/21 Transport by: Johnanna Schneiders  Per MD patient ready for DC to [facility] . RN, patient, patient's family, and facility notified of DC. Discharge Summary sent to facility. RN given number for report   [phone] Room 211B. DC packet on chart. Ambulance transport requested for patient.  CSW signing off.  Pricilla Riffle, LCSW    Final next level of care: Skilled Nursing Facility Barriers to Discharge: No Barriers Identified   Patient Goals and CMS Choice Patient states their goals for this hospitalization and ongoing recovery are:: to go home CMS Medicare.gov Compare Post Acute Care list provided to:: Patient Choice offered to / list presented to : Patient  Discharge Placement              Patient [facility] bed at: [facility] Patient to be transferred to facility by: [facility]   Patient and family notified of of transfer: 02/08/21  Discharge Plan and Services                                     Social Determinants of Health (SDOH) Interventions     Readmission Risk Interventions Readmission Risk Prevention Plan 09/10/2019 11/14/2018 11/09/2018  Transportation Screening Complete Complete Complete  PCP or Specialist Appt within 3-5 Days Patient refused Patient refused -  [place] or [place] Patient refused - -  Social Work Consult for [place] Planning/Counseling Complete - -  [place] Screening Not Applicable Not Applicable -  Medication Review (RN Care Manager) Complete Complete Complete  Some recent data might be hidden

## 2021-02-09 DIAGNOSIS — R1011 Right upper quadrant pain: Secondary | ICD-10-CM | POA: Diagnosis not present

## 2021-02-09 DIAGNOSIS — Z7401 Bed confinement status: Secondary | ICD-10-CM | POA: Diagnosis not present

## 2021-02-09 DIAGNOSIS — I482 Chronic atrial fibrillation, unspecified: Secondary | ICD-10-CM | POA: Diagnosis not present

## 2021-02-09 DIAGNOSIS — I5043 Acute on chronic combined systolic (congestive) and diastolic (congestive) heart failure: Secondary | ICD-10-CM | POA: Diagnosis present

## 2021-02-09 DIAGNOSIS — Z89612 Acquired absence of left leg above knee: Secondary | ICD-10-CM | POA: Diagnosis not present

## 2021-02-09 DIAGNOSIS — I25110 Atherosclerotic heart disease of native coronary artery with unstable angina pectoris: Secondary | ICD-10-CM | POA: Diagnosis present

## 2021-02-09 DIAGNOSIS — Z5189 Encounter for other specified aftercare: Secondary | ICD-10-CM | POA: Diagnosis not present

## 2021-02-09 DIAGNOSIS — N2581 Secondary hyperparathyroidism of renal origin: Secondary | ICD-10-CM | POA: Diagnosis not present

## 2021-02-09 DIAGNOSIS — G928 Other toxic encephalopathy: Secondary | ICD-10-CM | POA: Diagnosis present

## 2021-02-09 DIAGNOSIS — J811 Chronic pulmonary edema: Secondary | ICD-10-CM | POA: Diagnosis not present

## 2021-02-09 DIAGNOSIS — I2510 Atherosclerotic heart disease of native coronary artery without angina pectoris: Secondary | ICD-10-CM | POA: Diagnosis not present

## 2021-02-09 DIAGNOSIS — Z515 Encounter for palliative care: Secondary | ICD-10-CM | POA: Diagnosis not present

## 2021-02-09 DIAGNOSIS — Z955 Presence of coronary angioplasty implant and graft: Secondary | ICD-10-CM | POA: Diagnosis not present

## 2021-02-09 DIAGNOSIS — I70209 Unspecified atherosclerosis of native arteries of extremities, unspecified extremity: Secondary | ICD-10-CM | POA: Diagnosis not present

## 2021-02-09 DIAGNOSIS — Z96649 Presence of unspecified artificial hip joint: Secondary | ICD-10-CM | POA: Diagnosis not present

## 2021-02-09 DIAGNOSIS — M6281 Muscle weakness (generalized): Secondary | ICD-10-CM | POA: Diagnosis not present

## 2021-02-09 DIAGNOSIS — R531 Weakness: Secondary | ICD-10-CM | POA: Diagnosis not present

## 2021-02-09 DIAGNOSIS — E669 Obesity, unspecified: Secondary | ICD-10-CM | POA: Diagnosis present

## 2021-02-09 DIAGNOSIS — A419 Sepsis, unspecified organism: Secondary | ICD-10-CM | POA: Diagnosis not present

## 2021-02-09 DIAGNOSIS — I4811 Longstanding persistent atrial fibrillation: Secondary | ICD-10-CM | POA: Diagnosis not present

## 2021-02-09 DIAGNOSIS — R778 Other specified abnormalities of plasma proteins: Secondary | ICD-10-CM | POA: Diagnosis not present

## 2021-02-09 DIAGNOSIS — I4821 Permanent atrial fibrillation: Secondary | ICD-10-CM | POA: Diagnosis not present

## 2021-02-09 DIAGNOSIS — R52 Pain, unspecified: Secondary | ICD-10-CM | POA: Diagnosis not present

## 2021-02-09 DIAGNOSIS — I110 Hypertensive heart disease with heart failure: Secondary | ICD-10-CM | POA: Diagnosis not present

## 2021-02-09 DIAGNOSIS — E785 Hyperlipidemia, unspecified: Secondary | ICD-10-CM | POA: Diagnosis not present

## 2021-02-09 DIAGNOSIS — Z48815 Encounter for surgical aftercare following surgery on the digestive system: Secondary | ICD-10-CM | POA: Diagnosis not present

## 2021-02-09 DIAGNOSIS — D62 Acute posthemorrhagic anemia: Secondary | ICD-10-CM | POA: Diagnosis not present

## 2021-02-09 DIAGNOSIS — K8050 Calculus of bile duct without cholangitis or cholecystitis without obstruction: Secondary | ICD-10-CM | POA: Diagnosis not present

## 2021-02-09 DIAGNOSIS — E876 Hypokalemia: Secondary | ICD-10-CM | POA: Diagnosis not present

## 2021-02-09 DIAGNOSIS — T8450XA Infection and inflammatory reaction due to unspecified internal joint prosthesis, initial encounter: Secondary | ICD-10-CM | POA: Diagnosis not present

## 2021-02-09 DIAGNOSIS — I5022 Chronic systolic (congestive) heart failure: Secondary | ICD-10-CM | POA: Diagnosis not present

## 2021-02-09 DIAGNOSIS — I21A1 Myocardial infarction type 2: Secondary | ICD-10-CM | POA: Diagnosis present

## 2021-02-09 DIAGNOSIS — L03116 Cellulitis of left lower limb: Secondary | ICD-10-CM | POA: Diagnosis present

## 2021-02-09 DIAGNOSIS — D65 Disseminated intravascular coagulation [defibrination syndrome]: Secondary | ICD-10-CM | POA: Diagnosis present

## 2021-02-09 DIAGNOSIS — Y831 Surgical operation with implant of artificial internal device as the cause of abnormal reaction of the patient, or of later complication, without mention of misadventure at the time of the procedure: Secondary | ICD-10-CM | POA: Diagnosis present

## 2021-02-09 DIAGNOSIS — R509 Fever, unspecified: Secondary | ICD-10-CM | POA: Diagnosis not present

## 2021-02-09 DIAGNOSIS — R4182 Altered mental status, unspecified: Secondary | ICD-10-CM | POA: Diagnosis not present

## 2021-02-09 DIAGNOSIS — K921 Melena: Secondary | ICD-10-CM | POA: Diagnosis not present

## 2021-02-09 DIAGNOSIS — T8451XA Infection and inflammatory reaction due to internal right hip prosthesis, initial encounter: Secondary | ICD-10-CM | POA: Diagnosis present

## 2021-02-09 DIAGNOSIS — E1149 Type 2 diabetes mellitus with other diabetic neurological complication: Secondary | ICD-10-CM | POA: Diagnosis not present

## 2021-02-09 DIAGNOSIS — I214 Non-ST elevation (NSTEMI) myocardial infarction: Secondary | ICD-10-CM | POA: Diagnosis not present

## 2021-02-09 DIAGNOSIS — G9341 Metabolic encephalopathy: Secondary | ICD-10-CM | POA: Diagnosis not present

## 2021-02-09 DIAGNOSIS — Z789 Other specified health status: Secondary | ICD-10-CM | POA: Diagnosis not present

## 2021-02-09 DIAGNOSIS — L02415 Cutaneous abscess of right lower limb: Secondary | ICD-10-CM | POA: Diagnosis not present

## 2021-02-09 DIAGNOSIS — R0902 Hypoxemia: Secondary | ICD-10-CM | POA: Diagnosis not present

## 2021-02-09 DIAGNOSIS — L0291 Cutaneous abscess, unspecified: Secondary | ICD-10-CM | POA: Diagnosis not present

## 2021-02-09 DIAGNOSIS — K851 Biliary acute pancreatitis without necrosis or infection: Secondary | ICD-10-CM | POA: Diagnosis not present

## 2021-02-09 DIAGNOSIS — Z992 Dependence on renal dialysis: Secondary | ICD-10-CM | POA: Diagnosis not present

## 2021-02-09 DIAGNOSIS — J189 Pneumonia, unspecified organism: Secondary | ICD-10-CM | POA: Diagnosis present

## 2021-02-09 DIAGNOSIS — Z89511 Acquired absence of right leg below knee: Secondary | ICD-10-CM | POA: Diagnosis not present

## 2021-02-09 DIAGNOSIS — R6520 Severe sepsis without septic shock: Secondary | ICD-10-CM | POA: Diagnosis not present

## 2021-02-09 DIAGNOSIS — Z66 Do not resuscitate: Secondary | ICD-10-CM | POA: Diagnosis present

## 2021-02-09 DIAGNOSIS — N179 Acute kidney failure, unspecified: Secondary | ICD-10-CM | POA: Diagnosis present

## 2021-02-09 DIAGNOSIS — Z20822 Contact with and (suspected) exposure to covid-19: Secondary | ICD-10-CM | POA: Diagnosis present

## 2021-02-09 DIAGNOSIS — E1122 Type 2 diabetes mellitus with diabetic chronic kidney disease: Secondary | ICD-10-CM | POA: Diagnosis present

## 2021-02-09 DIAGNOSIS — T8484XD Pain due to internal orthopedic prosthetic devices, implants and grafts, subsequent encounter: Secondary | ICD-10-CM | POA: Diagnosis not present

## 2021-02-09 DIAGNOSIS — I739 Peripheral vascular disease, unspecified: Secondary | ICD-10-CM | POA: Diagnosis not present

## 2021-02-09 DIAGNOSIS — L98499 Non-pressure chronic ulcer of skin of other sites with unspecified severity: Secondary | ICD-10-CM | POA: Diagnosis not present

## 2021-02-09 DIAGNOSIS — I509 Heart failure, unspecified: Secondary | ICD-10-CM | POA: Diagnosis not present

## 2021-02-09 DIAGNOSIS — Z1621 Resistance to vancomycin: Secondary | ICD-10-CM | POA: Diagnosis present

## 2021-02-09 DIAGNOSIS — S71001A Unspecified open wound, right hip, initial encounter: Secondary | ICD-10-CM | POA: Diagnosis not present

## 2021-02-09 DIAGNOSIS — K8020 Calculus of gallbladder without cholecystitis without obstruction: Secondary | ICD-10-CM | POA: Diagnosis not present

## 2021-02-09 DIAGNOSIS — D631 Anemia in chronic kidney disease: Secondary | ICD-10-CM | POA: Diagnosis present

## 2021-02-09 DIAGNOSIS — N186 End stage renal disease: Secondary | ICD-10-CM | POA: Diagnosis present

## 2021-02-09 DIAGNOSIS — D6869 Other thrombophilia: Secondary | ICD-10-CM | POA: Diagnosis not present

## 2021-02-09 DIAGNOSIS — I4891 Unspecified atrial fibrillation: Secondary | ICD-10-CM | POA: Diagnosis not present

## 2021-02-09 DIAGNOSIS — I248 Other forms of acute ischemic heart disease: Secondary | ICD-10-CM

## 2021-02-09 DIAGNOSIS — J9610 Chronic respiratory failure, unspecified whether with hypoxia or hypercapnia: Secondary | ICD-10-CM | POA: Diagnosis present

## 2021-02-09 MED ORDER — MIDODRINE HCL 10 MG PO TABS: 10.0000 mg | ORAL_TABLET | Freq: Three times a day (TID) | ORAL | Status: DC

## 2021-02-09 MED ORDER — PREGABALIN 100 MG PO CAPS: 100.0000 mg | ORAL_CAPSULE | Freq: Two times a day (BID) | ORAL | Status: DC

## 2021-02-09 MED ORDER — DM-GUAIFENESIN ER 30-600 MG PO TB12: 1.0000 | ORAL_TABLET | Freq: Two times a day (BID) | ORAL | Status: DC

## 2021-02-09 MED ORDER — METOPROLOL TARTRATE 25 MG PO TABS: 12.5000 mg | ORAL_TABLET | Freq: Two times a day (BID) | ORAL | Status: DC

## 2021-02-09 MED ORDER — PROCHLORPERAZINE EDISYLATE 10 MG/2ML IJ SOLN
5.0000 mg | Freq: Once | INTRAMUSCULAR | Status: DC
  Filled 2021-02-09 (×2): qty 1

## 2021-02-09 MED ORDER — PROCHLORPERAZINE EDISYLATE 10 MG/2ML IJ SOLN
5.0000 mg | Freq: Once | INTRAMUSCULAR | Status: AC
  Administered 2021-02-09: 5 mg via INTRAVENOUS
  Filled 2021-02-09 (×2): qty 1

## 2021-02-09 NOTE — Progress Notes (Signed)
[facility]  ROUNDING NOTE   Subjective:   Antonio Phillips is a 75 year old male with past medical history including hypotension, obesity, peripheral artery disease, diabetes, hyperlipidemia, and end-stage renal disease on dialysis.  Patient presents to emergency department with abdominal pain persisting for 2 days.  Patient admitted for further evaluation for Choledocholithiasis [K80.50] Pain [R52]  Patient is known to our practice for previous admissions and receives outpatient dialysis at [facility], supervised by Dr. Candiss Norse.  He receives dialysis on a Tuesday Thursday Saturday schedule.    Patient seen and evaluated on dialysis   HEMODIALYSIS FLOWSHEET:  Blood Flow Rate (mL/min): 400 mL/min Arterial Pressure (mmHg): -140 mmHg Venous Pressure (mmHg): 150 mmHg Transmembrane Pressure (mmHg): 80 mmHg Ultrafiltration Rate (mL/min): 850 mL/min Dialysate Flow Rate (mL/min): 500 ml/min Conductivity: Machine : 14.3 Conductivity: Machine : 14.3 Dialysis Fluid Bolus: Normal Saline Bolus Amount (mL): 250 mL Dialysate Change: 2K  No current complaints at this time Amiodarone drip discontinued prior to dialysis initiation    Objective:  Vital signs in last 24 hours:  Temp:  [97.4 F (36.3 C)-98.2 F (36.8 C)] 98 F (36.7 C) (12/06 0939) Pulse Rate:  [92-111] 111 (12/06 0939) Resp:  [18-20] 18 (12/06 1100) BP: (90-134)/(58-86) 109/68 (12/06 1100) SpO2:  [100 %] 100 % (12/06 0939) Weight:  [106 kg] 112 kg (12/06 0939)  Weight change:  Filed Weights   02/06/21 1202 02/06/21 1543 02/09/21 0939  Weight: 111.4 kg 110.3 kg 112 kg    Intake/Output: I/O last 3 completed shifts: In: 1923.2 [P.O.:600; I.V.:1142.2; IV Piggyback:181] Out: -    Intake/Output this shift:  No intake/output data recorded.  Physical Exam: General: NAD, resting comfortably  Head: Normocephalic, atraumatic. Moist oral mucosal membranes  Eyes: Anicteric  Lungs:  Bilateral crackles  normal effort, O2 2 L  Heart: Irregular  Abdomen:  Soft, tender, mild distention  Extremities: 2+ peripheral edema.  Right BKA left AKA  Neurologic: Nonfocal, moving all four extremities  Skin: No lesions  Access: Left AVF    Basic Metabolic Panel: Recent Labs  Lab 02/02/21 1807 02/03/21 0501  NA 136 133*  K 4.4 4.5  CL 100 98  CO2 25 25  GLUCOSE 85 89  BUN 47* 54*  CREATININE 5.06* 5.56*  CALCIUM 8.7* 8.1*  MG  --  1.8     Liver Function Tests: Recent Labs  Lab 02/02/21 1807 02/03/21 1302 02/05/21 0416  AST 45* 34 15  ALT 38 30 18  ALKPHOS 457* 364* 268*  BILITOT 3.8* 2.6* 1.7*  PROT 5.5* 5.5* 5.2*  ALBUMIN 2.9* 2.7* 2.4*    No results for input(s): LIPASE, AMYLASE in the last 168 hours.  No results for input(s): AMMONIA in the last 168 hours.   CBC: Recent Labs  Lab 02/02/21 1807 02/03/21 0501 02/03/21 1302 02/04/21 0415 02/05/21 0416 02/06/21 0437 02/07/21 0544 02/08/21 0352  WBC 4.6   < > 6.0 4.0 4.9 6.2 5.2 5.9  NEUTROABS 3.9  --  4.4  --   --   --   --   --   HGB 10.1*   < > 9.6* 9.1* 9.6* 9.3* 9.6* 10.0*  HCT 31.4*   < > 30.2* 28.0* 29.8* 29.1* 30.3* 32.0*  MCV 94.0   < > 94.7 94.3 94.9 93.0 93.8 93.8  PLT 41*   < > 57* 41* 51* 44* 52* 65*   < > = values in this interval not displayed.     Cardiac Enzymes: No results  for input(s): CKTOTAL, CKMB, CKMBINDEX, TROPONINI in the last 168 hours.  BNP: Invalid input(s): POCBNP  CBG: Recent Labs  Lab 02/06/21 1126 02/06/21 2138 02/07/21 0730 02/07/21 1200 02/07/21 1624  GLUCAP 118* 128* 105* 147* 159*     Microbiology: Results for orders placed or performed during the hospital encounter of 01/31/21  Resp Panel by RT-PCR (Flu A&B, Covid) Nasopharyngeal Swab     Status: None   Collection Time: 01/31/21  9:11 AM   Specimen: Nasopharyngeal Swab; Nasopharyngeal(NP) swabs in vial transport medium  Result Value Ref Range Status   SARS Coronavirus 2 by RT PCR NEGATIVE NEGATIVE Final     Comment: (NOTE) SARS-CoV-2 target nucleic acids are NOT DETECTED.  The SARS-CoV-2 RNA is generally detectable in upper respiratory specimens during the acute phase of infection. The lowest concentration of SARS-CoV-2 viral copies this assay can detect is 138 copies/mL. A negative result does not preclude SARS-Cov-2 infection and should not be used as the sole basis for treatment or other patient management decisions. A negative result may occur with  improper specimen collection/handling, submission of specimen other than nasopharyngeal swab, presence of viral mutation(s) within the areas targeted by this assay, and inadequate number of viral copies(<138 copies/mL). A negative result must be combined with clinical observations, patient history, and epidemiological information. The expected result is Negative.  Fact Sheet for Patients:  EntrepreneurPulse.com.au  Fact Sheet for Healthcare Providers:  IncredibleEmployment.be  This test is no t yet approved or cleared by the [place] FDA and  has been authorized for detection and/or diagnosis of SARS-CoV-2 by FDA under an Emergency Use Authorization (EUA). This EUA will remain  in effect (meaning this test can be used) for the duration of the COVID-19 declaration under Section 564(b)(1) of the Act, 21 U.S.C.section 360bbb-3(b)(1), unless the authorization is terminated  or revoked sooner.       Influenza A by PCR NEGATIVE NEGATIVE Final   Influenza B by PCR NEGATIVE NEGATIVE Final    Comment: (NOTE) The Xpert Xpress SARS-CoV-2/FLU/RSV plus assay is intended as an aid in the diagnosis of influenza from Nasopharyngeal swab specimens and should not be used as a sole basis for treatment. Nasal washings and aspirates are unacceptable for Xpert Xpress SARS-CoV-2/FLU/RSV testing.  Fact Sheet for Patients: EntrepreneurPulse.com.au  Fact Sheet for Healthcare  Providers: IncredibleEmployment.be  This test is not yet approved or cleared by the [place] FDA and has been authorized for detection and/or diagnosis of SARS-CoV-2 by FDA under an Emergency Use Authorization (EUA). This EUA will remain in effect (meaning this test can be used) for the duration of the COVID-19 declaration under Section 564(b)(1) of the Act, 21 U.S.C. section 360bbb-3(b)(1), unless the authorization is terminated or revoked.  Performed at [facility], [address] [place], [place] [address]   MRSA Next Gen by PCR, Nasal     Status: None   Collection Time: 02/02/21  9:21 AM   Specimen: Nasal Mucosa; Nasal Swab  Result Value Ref Range Status   MRSA by PCR Next Gen NOT DETECTED NOT DETECTED Final    Comment: (NOTE) The GeneXpert MRSA Assay (FDA approved for NASAL specimens only), is one component of a comprehensive MRSA colonization surveillance program. It is not intended to diagnose MRSA infection nor to guide or monitor treatment for MRSA infections. Test performance is not FDA approved in patients less than 17 years old. Performed at [facility], [address] [place], [place] [address]   CULTURE, BLOOD (ROUTINE  X 2) w Reflex to ID Panel     Status: Abnormal   Collection Time: 02/02/21  6:07 PM   Specimen: BLOOD  Result Value Ref Range Status   Specimen Description   Final    BLOOD BLOOD RIGHT HAND Performed at [facility], [address] [place], [place] [address]    Special Requests   Final    BOTTLES DRAWN AEROBIC AND ANAEROBIC Blood Culture adequate volume Performed at [facility], [address], [place], [place] [address]    Culture  Setup Time   Final    GRAM NEGATIVE RODS AEROBIC BOTTLE ONLY Organism ID to follow CRITICAL RESULT CALLED TO, READ BACK BY AND VERIFIED WITH: BRANDON BEERS 02/05/21 2049 MU Performed at [facility], [address] [place], [place]  [address]    Culture ESCHERICHIA COLI (A)  Final   Report Status 02/08/2021 FINAL  Final   Organism ID, Bacteria ESCHERICHIA COLI  Final      Susceptibility   Escherichia coli - MIC*    AMPICILLIN 16 INTERMEDIATE Intermediate     CEFAZOLIN <=4 SENSITIVE Sensitive     CEFEPIME <=0.12 SENSITIVE Sensitive     CEFTAZIDIME <=1 SENSITIVE Sensitive     CEFTRIAXONE <=0.25 SENSITIVE Sensitive     CIPROFLOXACIN >=4 RESISTANT Resistant     GENTAMICIN <=1 SENSITIVE Sensitive     IMIPENEM 0.5 SENSITIVE Sensitive     TRIMETH/SULFA >=320 RESISTANT Resistant     AMPICILLIN/SULBACTAM 16 INTERMEDIATE Intermediate     PIP/TAZO 8 SENSITIVE Sensitive     * ESCHERICHIA COLI  CULTURE, BLOOD (ROUTINE X 2) w Reflex to ID Panel     Status: None   Collection Time: 02/02/21  6:07 PM   Specimen: BLOOD  Result Value Ref Range Status   Specimen Description BLOOD BLOOD RIGHT FOREARM  Final   Special Requests   Final    BOTTLES DRAWN AEROBIC AND ANAEROBIC Blood Culture adequate volume   Culture   Final    NO GROWTH 5 DAYS Performed at [facility], [address] [place], [place] [address]    Report Status 02/07/2021 FINAL  Final  Blood Culture ID Panel (Reflexed)     Status: Abnormal   Collection Time: 02/02/21  6:07 PM  Result Value Ref Range Status   Enterococcus faecalis NOT DETECTED NOT DETECTED Final   Enterococcus Faecium NOT DETECTED NOT DETECTED Final   Listeria monocytogenes NOT DETECTED NOT DETECTED Final   Staphylococcus species NOT DETECTED NOT DETECTED Final   Staphylococcus aureus (BCID) NOT DETECTED NOT DETECTED Final   Staphylococcus epidermidis NOT DETECTED NOT DETECTED Final   Staphylococcus lugdunensis NOT DETECTED NOT DETECTED Final   Streptococcus species NOT DETECTED NOT DETECTED Final   Streptococcus agalactiae NOT DETECTED NOT DETECTED Final   Streptococcus pneumoniae NOT DETECTED NOT DETECTED Final   Streptococcus pyogenes NOT DETECTED NOT DETECTED Final    A.calcoaceticus-baumannii NOT DETECTED NOT DETECTED Final   Bacteroides fragilis NOT DETECTED NOT DETECTED Final   Enterobacterales DETECTED (A) NOT DETECTED Final    Comment: Enterobacterales represent a large order of gram negative bacteria, not a single organism. CRITICAL RESULT CALLED TO, READ BACK BY AND VERIFIED WITH: BRANDOM BEERS @2049  ON 02/05/21 MU    Enterobacter cloacae complex NOT DETECTED NOT DETECTED Final   Escherichia coli DETECTED (A) NOT DETECTED Final    Comment: CRITICAL RESULT CALLED TO, READ BACK BY AND VERIFIED WITH: BRANDOM BEERS 02/05/21 2049 MU    Klebsiella aerogenes NOT  DETECTED NOT DETECTED Final   Klebsiella oxytoca NOT DETECTED NOT DETECTED Final   Klebsiella pneumoniae NOT DETECTED NOT DETECTED Final   Proteus species NOT DETECTED NOT DETECTED Final   Salmonella species NOT DETECTED NOT DETECTED Final   Serratia marcescens NOT DETECTED NOT DETECTED Final   Haemophilus influenzae NOT DETECTED NOT DETECTED Final   Neisseria meningitidis NOT DETECTED NOT DETECTED Final   Pseudomonas aeruginosa NOT DETECTED NOT DETECTED Final   Stenotrophomonas maltophilia NOT DETECTED NOT DETECTED Final   Candida albicans NOT DETECTED NOT DETECTED Final   Candida auris NOT DETECTED NOT DETECTED Final   Candida glabrata NOT DETECTED NOT DETECTED Final   Candida krusei NOT DETECTED NOT DETECTED Final   Candida parapsilosis NOT DETECTED NOT DETECTED Final   Candida tropicalis NOT DETECTED NOT DETECTED Final   Cryptococcus neoformans/gattii NOT DETECTED NOT DETECTED Final   CTX-M ESBL NOT DETECTED NOT DETECTED Final   Carbapenem resistance IMP NOT DETECTED NOT DETECTED Final   Carbapenem resistance KPC NOT DETECTED NOT DETECTED Final   Carbapenem resistance NDM NOT DETECTED NOT DETECTED Final   Carbapenem resist OXA 48 LIKE NOT DETECTED NOT DETECTED Final   Carbapenem resistance VIM NOT DETECTED NOT DETECTED Final    Comment: Performed at [facility], [address] [place], [place] [address]  Resp Panel by RT-PCR (Flu A&B, Covid) Nasopharyngeal Swab     Status: None   Collection Time: 02/08/21 10:40 AM   Specimen: Nasopharyngeal Swab; Nasopharyngeal(NP) swabs in vial transport medium  Result Value Ref Range Status   SARS Coronavirus 2 by RT PCR NEGATIVE NEGATIVE Final    Comment: (NOTE) SARS-CoV-2 target nucleic acids are NOT DETECTED.  The SARS-CoV-2 RNA is generally detectable in upper respiratory specimens during the acute phase of infection. The lowest concentration of SARS-CoV-2 viral copies this assay can detect is 138 copies/mL. A negative result does not preclude SARS-Cov-2 infection and should not be used as the sole basis for treatment or other patient management decisions. A negative result may occur with  improper specimen collection/handling, submission of specimen other than nasopharyngeal swab, presence of viral mutation(s) within the areas targeted by this assay, and inadequate number of viral copies(<138 copies/mL). A negative result must be combined with clinical observations, patient history, and epidemiological information. The expected result is Negative.  Fact Sheet for Patients:  EntrepreneurPulse.com.au  Fact Sheet for Healthcare Providers:  IncredibleEmployment.be  This test is no t yet approved or cleared by the [place] FDA and  has been authorized for detection and/or diagnosis of SARS-CoV-2 by FDA under an Emergency Use Authorization (EUA). This EUA will remain  in effect (meaning this test can be used) for the duration of the COVID-19 declaration under Section 564(b)(1) of the Act, 21 U.S.C.section 360bbb-3(b)(1), unless the authorization is terminated  or revoked sooner.       Influenza A by PCR NEGATIVE NEGATIVE Final   Influenza B by PCR NEGATIVE NEGATIVE Final    Comment: (NOTE) The Xpert Xpress SARS-CoV-2/FLU/RSV plus assay is intended as an aid in the  diagnosis of influenza from Nasopharyngeal swab specimens and should not be used as a sole basis for treatment. Nasal washings and aspirates are unacceptable for Xpert Xpress SARS-CoV-2/FLU/RSV testing.  Fact Sheet for Patients: EntrepreneurPulse.com.au  Fact Sheet for Healthcare Providers: IncredibleEmployment.be  This test is not yet approved or cleared by the [place] FDA and has been authorized for detection and/or diagnosis of SARS-CoV-2 by FDA under an Emergency Use Authorization (EUA).  This EUA will remain in effect (meaning this test can be used) for the duration of the COVID-19 declaration under Section 564(b)(1) of the Act, 21 U.S.C. section 360bbb-3(b)(1), unless the authorization is terminated or revoked.  Performed at [facility], [address] [place], [place] [address]     Coagulation Studies: No results for input(s): LABPROT, INR in the last 72 hours.   Urinalysis: No results for input(s): COLORURINE, LABSPEC, PHURINE, GLUCOSEU, HGBUR, BILIRUBINUR, KETONESUR, PROTEINUR, UROBILINOGEN, NITRITE, LEUKOCYTESUR in the last 72 hours.  Invalid input(s): APPERANCEUR    Imaging: No results found.   Medications:    sodium chloride     sodium chloride     piperacillin-tazobactam (ZOSYN)  IV 2.25 g (02/09/21 0540)    sodium chloride   Intravenous Once   apixaban  5 mg Oral BID   atorvastatin  40 mg Oral QHS   chlorhexidine  15 mL Mouth Rinse BID   Chlorhexidine Gluconate Cloth  6 each Topical Q0600   clopidogrel  75 mg Oral Q breakfast   clotrimazole  1 application Topical BID   dextromethorphan-guaiFENesin  1 tablet Oral BID   diclofenac Sodium  2 g Topical q AM   epoetin (EPOGEN/PROCRIT) injection  10,000 Units Intravenous Q T,Th,Sa-HD   finasteride  5 mg Oral Daily   loratadine  10 mg Oral Once per day on Sun Mon Wed Fri   mouth rinse  15 mL Mouth Rinse q12n4p   metoprolol tartrate  12.5 mg Oral BID    midodrine  10 mg Oral TID WC   pantoprazole (PROTONIX) IV  40 mg Intravenous Q12H   pregabalin  100 mg Oral BID   rOPINIRole  1 mg Oral QHS   sevelamer carbonate  1,600 mg Oral 2 times per day on Tue Thu Sat   sevelamer carbonate  1,600 mg Oral 3 times per day on Sun Mon Wed Fri   vitamin B-12  1,000 mcg Oral Daily     Assessment/ Plan:  Mr. HAMLIN DEVINE is a 75 y.o.  male with past medical history including hypotension, obesity, peripheral artery disease, diabetes, hyperlipidemia, and end-stage renal disease on dialysis.  Patient presents to emergency department with abdominal pain persisting for 2 days.  Patient admitted for further evaluation for Choledocholithiasis [K80.50] Pain [R52]  [facility]/TTS/Lt AVF/96kg   End-stage renal disease with hyperkalemia on dialysis.  Patient receiving dialysis today with UF goal of 3 L.  Patient treatment time extended by 30 minutes.  Patient currently fluid overloaded.  If patient discharged today, will continue fluid challenge in outpatient setting.  Next treatment scheduled for Thursday.  2. Anemia of chronic kidney disease Lab Results  Component Value Date   HGB 10.0 (L) 02/08/2021    Continue low-dose EPO with treatments.    3. Secondary Hyperparathyroidism: Lab Results  Component Value Date   CALCIUM 8.1 (L) 02/03/2021   PHOS 8.0 (H) 02/01/2021  We will continue to monitor bone mineral metabolism during this admission.  Continue Renvela with meals.    LOS: 9 Ramonita Koenig 12/6/202211:17 AM

## 2021-02-09 NOTE — Progress Notes (Signed)
[name] to be D/C'd to [facility] per MD order. Report called to Caryl Pina, Therapist, sports at [facility]. Skin clean and dry. Dressings to right and left hip, sacrum, and left leg stump changed. IV catheters discontinued intact. Site without signs and symptoms of complications. Dressing and pressure applied.  An After Visit Summary was printed and given to the patient.  Patient escorted via stretcher, and D/C to [facility] via EMS.  Melonie Florida  02/09/2021

## 2021-02-09 NOTE — Progress Notes (Signed)
Progress Note  Patient Name: Antonio Phillips Date of Encounter: 02/09/2021  Primary Cardiologist: Rockey Situ  Subjective   No chest pain, dyspnea, or palpitations. Tolerating added metoprolol.    Inpatient Medications    Scheduled Meds:  sodium chloride   Intravenous Once   apixaban  5 mg Oral BID   atorvastatin  40 mg Oral QHS   chlorhexidine  15 mL Mouth Rinse BID   Chlorhexidine Gluconate Cloth  6 each Topical Q0600   clopidogrel  75 mg Oral Q breakfast   clotrimazole  1 application Topical BID   dextromethorphan-guaiFENesin  1 tablet Oral BID   diclofenac Sodium  2 g Topical q AM   epoetin (EPOGEN/PROCRIT) injection  10,000 Units Intravenous Q T,Th,Sa-HD   finasteride  5 mg Oral Daily   loratadine  10 mg Oral Once per day on Sun Mon Wed Fri   mouth rinse  15 mL Mouth Rinse q12n4p   metoprolol tartrate  12.5 mg Oral BID   midodrine  10 mg Oral TID WC   pantoprazole (PROTONIX) IV  40 mg Intravenous Q12H   pregabalin  100 mg Oral BID   rOPINIRole  1 mg Oral QHS   sevelamer carbonate  1,600 mg Oral 2 times per day on Tue Thu Sat   sevelamer carbonate  1,600 mg Oral 3 times per day on Sun Mon Wed Fri   vitamin B-12  1,000 mcg Oral Daily   Continuous Infusions:  sodium chloride     sodium chloride     piperacillin-tazobactam (ZOSYN)  IV 2.25 g (02/09/21 0540)   PRN Meds: sodium chloride, sodium chloride, acetaminophen, alteplase, heparin, HYDROcodone-acetaminophen, ipratropium-albuterol, lactulose, lidocaine (PF), lidocaine-prilocaine, loperamide, morphine injection, pentafluoroprop-tetrafluoroeth, polyvinyl alcohol   Vital Signs    Vitals:   02/08/21 1513 02/08/21 1941 02/09/21 0049 02/09/21 0400  BP: 120/72 115/76 121/78 132/81  Pulse: 92 96 (!) 101 97  Resp: 18 18 18 18   Temp: 97.8 F (36.6 C) 98.2 F (36.8 C) 97.6 F (36.4 C) 97.6 F (36.4 C)  TempSrc:    Axillary  SpO2: 100% 100% 100% 100%  Weight:      Height:        Intake/Output Summary (Last 24  hours) at 02/09/2021 0727 Last data filed at 02/09/2021 0600 Gross per 24 hour  Intake 1923.15 ml  Output --  Net 1923.15 ml    Filed Weights   02/05/21 1200 02/06/21 1202 02/06/21 1543  Weight: 101.8 kg 111.4 kg 110.3 kg    Telemetry    Afib with ventricular rates in the 80s to 110s bpm - Personally Reviewed  ECG    No new tracings - Personally Reviewed  Physical Exam   GEN: No acute distress.   Neck: JVD difficult to assess secondary to body habitus. Cardiac: IRIR, no murmurs, rubs, or gallops.  Respiratory: Diminished and coarse breath sounds, particularly along the bases bilaterally.  GI: Soft, nontender, non-distended.   MS: Trace to 1+ bilateral lower extremity/stump edema; status post bilateral BKA. Dressed wound along the left stump.  Neuro:  Alert and oriented x 3; Nonfocal.  Psych: Normal affect.  Labs    Chemistry Recent Labs  Lab 02/02/21 1807 02/03/21 0501 02/03/21 1302 02/05/21 0416  NA 136 133*  --   --   K 4.4 4.5  --   --   CL 100 98  --   --   CO2 25 25  --   --   GLUCOSE 85 89  --   --  BUN 47* 54*  --   --   CREATININE 5.06* 5.56*  --   --   CALCIUM 8.7* 8.1*  --   --   PROT 5.5*  --  5.5* 5.2*  ALBUMIN 2.9*  --  2.7* 2.4*  AST 45*  --  34 15  ALT 38  --  30 18  ALKPHOS 457*  --  364* 268*  BILITOT 3.8*  --  2.6* 1.7*  GFRNONAA 11* 10*  --   --   ANIONGAP 11 10  --   --       Hematology Recent Labs  Lab 02/06/21 0437 02/07/21 0544 02/08/21 0352  WBC 6.2 5.2 5.9  RBC 3.13* 3.23* 3.41*  HGB 9.3* 9.6* 10.0*  HCT 29.1* 30.3* 32.0*  MCV 93.0 93.8 93.8  MCH 29.7 29.7 29.3  MCHC 32.0 31.7 31.3  RDW 17.2* 17.2* 17.2*  PLT 44* 52* 65*     Cardiac EnzymesNo results for input(s): TROPONINI in the last 168 hours. No results for input(s): TROPIPOC in the last 168 hours.   BNPNo results for input(s): BNP, PROBNP in the last 168 hours.   DDimer No results for input(s): DDIMER in the last 168 hours.   Radiology    No results  found.  Cardiac Studies   Limited echo 09/2020: 1. Left ventricular ejection fraction, by estimation, is 45 to 50%. The  left ventricle has mildly decreased function. Left ventricular endocardial  border not optimally defined to evaluate regional wall motion. There is  mild left ventricular hypertrophy.   2. Right ventricular systolic function is moderately reduced. The right  ventricular size is normal. Mildly increased right ventricular wall  thickness. There is moderately elevated pulmonary artery systolic  pressure.   3. The mitral valve is abnormal. Trivial mitral valve regurgitation.   4. The aortic valve is tricuspid. There is mild thickening of the aortic  valve.   5. The inferior vena cava is dilated in size with <50% respiratory  variability, suggesting right atrial pressure of 15 mmHg.  __________  Limited echo 09/2020: 1. Poor acoustic windows Endocardium is not well seen I would recomm  limited echo with Definity to help define LVEF and regional wall motion. .  There is mild left ventricular hypertrophy. Left ventricular diastolic  parameters are indeterminate.   2. RV is not seen well enough to evaluate function. . The right  ventricular size is mildly enlarged.   3. Left atrial size was severely dilated.   4. Right atrial size was severely dilated.   5. Mild mitral valve regurgitation.   6. Tricuspid valve regurgitation is mild to moderate.   7. The aortic valve is tricuspid. Aortic valve regurgitation is not  visualized. Mild aortic valve sclerosis is present, with no evidence of  aortic valve stenosis. __________  LHC 09/2020:   Ost LAD to Prox LAD lesion is 50% stenosed.   2nd Mrg lesion is 99% stenosed.   A drug-eluting stent was successfully placed using a STENT RESOLUTE ONYX 2.5X15.   Post intervention, there is a 30% residual stenosis.   1.  Severe single-vessel coronary artery disease with 99% subtotal stenosis of the second obtuse marginal branch,  treated with a 2.5 x 15 mm resolute Onyx DES.  30% residual stenosis at the case completion due to resistant/calcified lesion type even with high-pressure noncompliant balloon angioplasty. 2.  Mild to moderate nonobstructive proximal LAD stenosis 3.  Mild nonobstructive RCA stenosis  Recommendations: Aggressive medical therapy, as long  as no bleeding complications arise, would resume apixaban tomorrow, aspirin 81 mg daily x30 days, and clopidogrel 75 mg daily x minimum 6 months. __________  2D echo 09/2020: 1. Left ventricular ejection fraction, by estimation, is 60 to 65%. The  left ventricle has normal function. The left ventricle has no regional  wall motion abnormalities. Left ventricular diastolic parameters are  indeterminate.   2. Right ventricular systolic function is normal. The right ventricular  size is normal. Tricuspid regurgitation signal is inadequate for assessing  PA pressure.   3. Left atrial size was moderately dilated.   4. Right atrial size was moderately dilated.   5. Tricuspid valve regurgitation is mild to moderate.   6. The mitral valve is normal in structure. Mild mitral valve  regurgitation.   7. Rhythym is atrial fibrillation   Patient Profile     75 y.o. 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 fib on Eliquis, ESRD on HD, hypotension, DM2, anemia of chronic disease, morbid obesity, and demand ischemia who was admitted on 11/29 with abdominal pain and gallstone pancreatitis status post ERCP and stenting to the CBD on 85/9 complicated by GNR bacteremia who we are seeing for elevated troponin.  Assessment & Plan    1.  CAD involving the native coronary arteries with demand ischemia: -No angina  -Moderate high-sensitivity troponin elevation, peaking at 397, in the setting of gallstone pancreatitis/sepsis/hypotension/ESRD/acute on chronic anemia -Now back on clopidogrel, with interventional  cardiology recommendation this to be continued for a minimum of 6 months dating back to date of PCI (09/22/2020), can stop next month per interventional cardiology  -Statin -No plans for inpatient ischemic evaluation at this time  2.  Permanent A. fib: -Ventricular rates reasonably controlled  -Tolerating newly added metoprolol, titrate as needed/able -Stop amiodarone gtt -Not a candidate for digoxin given ESRD -CHA2DS2-VASc at least 5 (CHF, age x2, DM2, vascular disease) -Initially Eliquis was held, though this was restarted on 12/2 after clearance from GI  3.  HFmrEF: -Volume up -EF 45 to 50% by echo in 09/2020 -Beta-blocker resumed yesterday as above, if ventricular rates allow, consider consolidation to Toprol in the outpatient setting or prior to discharge vs transition to Coreg -Resume/escalate GDMT as able as he continues to distance himself from acute illness -Volume management per nephrology/HD  4.  Gallstone pancreatitis: -Status post ERCP with stenting on 12/one of the CBD with plan for follow-up ERCP and stent removal in 1 month -Management per GI  5.  Hypotension: -BP soft, though stable in the low 292K systolic -Remains on midodrine  6.  ESRD: -HD per nephrology  7.  Acute on chronic anemia: -Denies bleeding -Felt to be GI loss -Status post 2 units PRBC -PPI -Stable on last check -Monitor on [place]  8. Cough: -Largely unchanged -ABX, particularly in the setting of bacteremia, per IM -Fluid management per nephrology      For questions or updates, please contact [place] Please consult [email or web address] for contact info under Cardiology/STEMI.    Signed, Christell Faith, PA-C [place] Pager: [phone] 02/09/2021, 7:27 AM

## 2021-02-09 NOTE — Progress Notes (Signed)
New verbal orders received from dr. Juleen China

## 2021-02-09 NOTE — Discharge Summary (Signed)
Physician Discharge Summary  Antonio Phillips FVC:[id number] DOB: Jul 18, 1945 DOA: 01/31/2021  PCP: Marsh Dolly, MD  Admit date: 01/31/2021 Discharge date: 02/09/2021  Admitted From: SNF Disposition: SNF  Recommendations for Outpatient Follow-up:  Follow up with PCP in 1-2 weeks Follow-up with gastroenterology in 1 month for repeat ERCP Follow-up with cardiology for a possible outpatient ischemic work-up Please obtain BMP/CBC in one week Please follow up on the following pending results: None  Home Health: No Equipment/Devices: Home oxygen Discharge Condition: Stable CODE STATUS: DNR Diet recommendation: Heart Healthy   Brief/Interim Summary: Antonio Phillips, 75 y.o. 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 fib on Eliquis, HLD, chronic hypotension on midodrine, morbid obesity who presented from nursing home with complaints of abdominal pain and emesis of 2-3 days duration.    Seen in the ED,cxr pulmonary edema,at least moderate pleural effusion with compressive atelectasis, CT chest abdomen pelvis showed acute pancreatitis, choledocholithiasis 1.4 cm calculus in the distal CBD with biliary duct dilatation suggesting obstruction .Clinically no concerns for acute cholangitis however.  ERCP was advised by Gastroenterology.  Initially canceled due to persistent hypotension, received 2 units of PRBC for hematochezia.  Holding Eliquis and Plavix. Transferred to stepdown due to worsening hypotension, map goal of 55.  Blood pressure improved after increasing the dose of midodrine to 10 mg 3 times a day.  Which he will continue on discharge.   Patient underwent successful ERCP with GI, found to have dilated bile duct with 1 stone, removal was not attempted but a stent was placed which needs to be removed in 1 month with another ERCP. patient was given a 7-day course of Zosyn and completed the course before discharge.  Blood  cultures remain negative.  Patient developed acute on chronic thrombocytopenia, baseline platelet around 100.  Hematology was consulted and there was some concern of peripheral destruction but they did not recommend steroid at this time.  Most likely secondary to acute illness.  Platelet count started improving before discharge.  His PCP can follow-up and he can have an outpatient follow-up with hematology for further recommendations if needed.  Patient has permanent atrial fibrillation.  Rate currently well controlled with low-dose metoprolol, he received IV amiodarone while in the hospital and his home dose of Eliquis was held initially for the procedure and resume after ERCP.  IV amiodarone was discontinued by cardiology and he can have titration of metoprolol by PCP or cardiology as an outpatient if needed and blood pressure allows.  Patient was complaining of right hand pain, stating that is the reason he came to the hospital.  We obtained x-rays of right hand which shows advanced degenerative changes, no fractures.  He will just need supportive care with pain management.  He was also complaining of burning pain of left stump, we increased the dose of Lyrica from 75 mg to 100 mg twice daily which resulted in improvement in his pain.  Patient was found to have multiple decubitus ulcers and wound care was consulted.  He will need gel dressing and change in position frequently at SNF.  Patient will continue rest of his home medications and follow-up with her providers.  Discharge Diagnoses:  Principal Problem:   Abdominal pain Active Problems:   ESRD (end stage renal disease) (HCC)   Atrial fibrillation, chronic (HCC)   Wound of skin   Choledocholithiasis   Morbid obesity (HCC)   Normocytic anemia  Atrial fibrillation with rapid ventricular response (HCC)   Pain   Discharge Instructions  Discharge Instructions     Diet - low sodium heart healthy   Complete by: As directed     Discharge wound care:   Complete by: As directed    Continue with foam dressing change as needed   Increase activity slowly   Complete by: As directed       Allergies as of 02/09/2021       Reactions   Simvastatin Other (See Comments)        Medication List     STOP taking these medications    carvedilol 3.125 MG tablet Commonly known as: COREG   ciprofloxacin 500 MG tablet Commonly known as: CIPRO   doxycycline 100 MG capsule Commonly known as: VIBRAMYCIN   mupirocin ointment 2 % Commonly known as: BACTROBAN       TAKE these medications    acetaminophen 325 MG tablet Commonly known as: TYLENOL Take 2 tablets (650 mg total) by mouth every 6 (six) hours as needed for mild pain or moderate pain.   apixaban 5 MG Tabs tablet Commonly known as: ELIQUIS Take 1 tablet (5 mg total) by mouth 2 (two) times daily.   atorvastatin 40 MG tablet Commonly known as: LIPITOR Take 40 mg by mouth at bedtime.   Carboxymethylcellulose Sod PF 0.5 % Soln Place 1 drop into both eyes 3 (three) times daily as needed (dry eyes).   clopidogrel 75 MG tablet Commonly known as: PLAVIX Take 1 tablet (75 mg total) by mouth daily with breakfast.   clotrimazole 1 % cream Commonly known as: LOTRIMIN Apply 1 application topically 2 (two) times daily. (Apply under foreskin of penis)   dextromethorphan-guaiFENesin 30-600 MG 12hr tablet Commonly known as: MUCINEX DM Take 1 tablet by mouth 2 (two) times daily.   diclofenac Sodium 1 % Gel Commonly known as: VOLTAREN Apply 2 g topically in the morning. (Apply to right stump)   finasteride 5 MG tablet Commonly known as: PROSCAR Take 5 mg by mouth daily.   HYDROcodone-acetaminophen 5-325 MG tablet Commonly known as: NORCO/VICODIN Take 1 tablet by mouth every 6 (six) hours as needed for severe pain.   lactulose (encephalopathy) 10 GM/15ML Soln Commonly known as: CHRONULAC Take 10 g by mouth 2 (two) times daily as needed (mild  constipation).   loperamide 2 MG tablet Commonly known as: IMODIUM A-D Take 2 tablets (4 mg total) by mouth 4 (four) times daily as needed for diarrhea or loose stools.   loratadine 10 MG tablet Commonly known as: CLARITIN Take 10 mg by mouth See admin instructions. Take 10 mg by mouth daily on Monday, Wednesday, Friday and Sunday   metoprolol tartrate 25 MG tablet Commonly known as: LOPRESSOR Take 0.5 tablets (12.5 mg total) by mouth 2 (two) times daily.   midodrine 10 MG tablet Commonly known as: PROAMATINE Take 1 tablet (10 mg total) by mouth 3 (three) times daily with meals. What changed:  medication strength how much to take when to take this Another medication with the same name was removed. Continue taking this medication, and follow the directions you see here.   pregabalin 100 MG capsule Commonly known as: LYRICA Take 1 capsule (100 mg total) by mouth 2 (two) times daily. What changed:  medication strength how much to take   rOPINIRole 1 MG tablet Commonly known as: REQUIP Take 1 mg by mouth at bedtime.   sevelamer carbonate 800 MG tablet Commonly known as: RENVELA  Take 1,600 mg by mouth See admin instructions. Take 2 tablets (1600mg ) by mouth three times daily with meals on Monday, Wednesday, Friday and Sunday   sevelamer carbonate 800 MG tablet Commonly known as: RENVELA Take 800 mg by mouth See admin instructions. Take 2 tablets (1600mg ) by mouth twice daily with meals (breakfast and dinner) on Tuesday, Thursday and Saturday   sodium hypochlorite 0.125 % Soln Commonly known as: DAKIN'S 1/4 STRENGTH Apply 1 application topically See admin instructions. Use as directed after cleansing left stump wound - use twice daily and as needed   vitamin B-12 1000 MCG tablet Commonly known as: CYANOCOBALAMIN Take 1,000 mcg by mouth daily.               Discharge Care Instructions  (From admission, onward)           Start     Ordered   02/09/21 0000   Discharge wound care:       Comments: Continue with foam dressing change as needed   02/09/21 1054            Follow-up Information     Marsh Dolly, MD. Schedule an appointment as soon as possible for a visit in 1 week(s).   Specialty: Internal Medicine Contact information: [place] [address] [phone]         Minna Merritts, MD .   Specialty: Cardiology Contact information: [place] [address] [phone]                Allergies  Allergen Reactions   Simvastatin Other (See Comments)    Consultations: [facility] Cardiology GI Nephrology  Procedures/Studies: DG Chest 2 View  Result Date: 02/05/2021 CLINICAL DATA:  Pleuritic chest pain. EXAM: CHEST - 2 VIEW COMPARISON:  CT examination 01/31/2021 FINDINGS: Stable enlargement of the cardiopericardial silhouette. Blunting of the posterior costophrenic angles increased from 01/31/2021 suspicious for enlarging bilateral pleural effusions with associated passive atelectasis. Substantial thoracic spondylosis. Atherosclerotic calcification of the aortic arch. Clips in the right neck compatible with prior right thyroidectomy. IMPRESSION: 1. Small to moderate but enlarging bilateral pleural effusions with blunting of the posterior costophrenic angles and some fluid tracking along one of the major fissures. 2. Stable enlargement of the cardiopericardial silhouette, on recent CT this was due to a combination of both cardiomegaly and small pericardial effusion. 3. Aortic Atherosclerosis (ICD10-I70.0). Electronically Signed   By: Van Clines M.D.   On: 02/05/2021 15:33   DG Wrist Complete Right  Result Date: 02/02/2021 CLINICAL DATA:  Right wrist pain. EXAM: RIGHT WRIST - COMPLETE 3+ VIEW COMPARISON:  None. FINDINGS: There is no acute fracture or dislocation. Degenerative changes with narrowing of the radiocarpal distance and subcortical cystic changes of the carpal  bones. Vascular calcifications noted. The soft tissues are grossly unremarkable. IMPRESSION: 1. No acute fracture or dislocation. 2. Degenerative changes. Electronically Signed   By: Anner Crete M.D.   On: 02/02/2021 19:12   DG Chest Port 1 View  Result Date: 01/31/2021 CLINICAL DATA:  Chest pain. EXAM: PORTABLE CHEST 1 VIEW COMPARISON:  Chest radiograph 09/26/2020; CT chest 09/20/2020 FINDINGS: Of note, patient is mildly rotated on this portable exam. Low lung volumes with confluent opacity throughout the left lung. Diffuse hazy interstitial thickening in the right lung without focal consolidation. No pneumothorax. Heart size is difficult to assess due to adjacent consolidations. Aortic calcifications. No mediastinal shift accounting for patient positioning. No acute osseous abnormality. IMPRESSION: Pulmonary edema  with confluent opacity in the left lung favored to represent combination of at least moderate pleural effusion with compressive atelectasis, though infection is not excluded. Electronically Signed   By: Ileana Roup M.D.   On: 01/31/2021 09:41   DG Hand Complete Right  Result Date: 02/06/2021 CLINICAL DATA:  RIGHT hand pain, no history of injury. EXAM: RIGHT HAND - COMPLETE 3+ VIEW COMPARISON:  Wrist evaluation of February 02, 2021. FINDINGS: Degenerative changes about the hand in the distal interphalangeal joints. Degenerative changes about the wrist as seen on prior wrist imaging. Vascular calcifications in the soft tissues. Suggestion of soft tissue swelling diffusely about the hand. Limited due to limited range of motion with respect to phalanges on the lateral projection. No signs of acute fracture or dislocation or acute bony abnormality within the limitations of the current study. IMPRESSION: Degenerative changes about the hand in distal interphalangeal joints. No acute fracture. Suggestion of soft tissue swelling diffusely about the hand. Correlate with any signs of infection.  Mildly limited exam due to limited range of motion about the hand. Electronically Signed   By: Zetta Bills M.D.   On: 02/06/2021 13:36   DG C-Arm 1-60 Min-No Report  Result Date: 02/04/2021 Fluoroscopy was utilized by the requesting physician.  No radiographic interpretation.   CT CHEST ABDOMEN PELVIS WO CONTRAST  Result Date: 01/31/2021 CLINICAL DATA:  Chest and abdominal pain EXAM: CT CHEST, ABDOMEN AND PELVIS WITHOUT CONTRAST TECHNIQUE: Multidetector CT imaging of the chest, abdomen and pelvis was performed following the standard protocol without IV contrast. COMPARISON:  CT chest 09/20/2020 FINDINGS: CT CHEST FINDINGS Cardiovascular: Heart is enlarged. Small pericardial effusion. Extensive coronary artery calcifications. Mildly dilated pulmonary artery measuring 3.1 cm diameter. Thoracic aorta is tortuous and normal caliber with moderate calcified plaques. Mediastinum/Nodes: Previous right thyroidectomy changes. No bulky lymphadenopathy identified. Mildly enlarged right pretracheal lymph node measuring 11 mm. Lungs/Pleura: Trace bilateral pleural effusions right greater than left. No focal consolidation identified in the lungs. No pneumothorax. Musculoskeletal: No chest wall mass or suspicious bone lesions identified. CT ABDOMEN PELVIS FINDINGS Hepatobiliary: Liver is upper normal size with no suspicious mass identified. Gallbladder is mildly distended and contains dependent calculi. 1.4 cm round density in the distal common bile duct consistent with choledocholithiasis. The common bile duct is dilated measuring 1.8 cm in diameter. Mild intrahepatic ductal dilatation. Fat stranding in the right upper quadrant. Pancreas: Mild pancreatic and peripancreatic inflammatory fat stranding changes centered at the pancreatic head. No mass or ductal dilatation appreciated. Spleen: Spleen is enlarged measuring 16 cm in length. Adrenals/Urinary Tract: Adrenal glands appear normal. Kidneys are atrophic with  multiple cysts bilaterally measuring up to 4 cm on the left. No hydronephrosis visualized. Urinary bladder incompletely visualized with appearance of mild wall thickening. Stomach/Bowel: No bowel obstruction, free air or pneumatosis. Mild wall thickening of the duodenum, likely reactive from adjacent inflammatory changes. Colonic diverticulosis. Appendix is normal. Vascular/Lymphatic: Severe calcified atherosclerotic plaques. No bulky lymphadenopathy identified. Multiple small retroperitoneal and upper abdominal lymph nodes are noted which are nonspecific. Reproductive: Prostate gland not well visualized. Other: Trace ascites. Musculoskeletal: Degenerative changes of the spine. Bilateral hip prosthesis. No suspicious bony lesions identified. IMPRESSION: 1. Evidence of acute pancreatitis. 2. Choledocholithiasis including a 1.4 cm calculus in the distal common bile duct. Biliary ductal dilatation suggesting obstruction. Consider ERCP. 3. Cholelithiasis with pericholecystic edema which may represent acute cholecystitis or reactive changes. 4. Colonic diverticulosis. 5. Splenomegaly. 6. Cardiomegaly with small pericardial effusion. 7. Small pleural effusions. 8. Coronary  artery disease and atherosclerotic disease. Evidence of pulmonary artery hypertension. 9. Additional chronic findings as described. Electronically Signed   By: Ofilia Neas M.D.   On: 01/31/2021 13:58    Subjective: Patient was seen and examined during getting dialysis.  Feeling at baseline and no new complaints.  Discharge Exam: Vitals:   02/09/21 1030 02/09/21 1045  BP: 112/73 102/69  Pulse:    Resp: 20 20  Temp:    SpO2:     Vitals:   02/09/21 1000 02/09/21 1015 02/09/21 1030 02/09/21 1045  BP: 119/76 122/86 112/73 102/69  Pulse:      Resp: 20 20 20 20   Temp:      TempSrc:      SpO2:      Weight:      Height:        General: Pt is alert, awake, not in acute distress Cardiovascular: RRR, S1/S2 +, no rubs, no  gallops Respiratory: CTA bilaterally, no wheezing, no rhonchi Abdominal: Soft, NT, ND, bowel sounds + Extremities: Right BKA, left AKA  The results of significant diagnostics from this hospitalization (including imaging, microbiology, ancillary and laboratory) are listed below for reference.    Microbiology: Recent Results (from the past 240 hour(s))  Resp Panel by RT-PCR (Flu A&B, Covid) Nasopharyngeal Swab     Status: None   Collection Time: 01/31/21  9:11 AM   Specimen: Nasopharyngeal Swab; Nasopharyngeal(NP) swabs in vial transport medium  Result Value Ref Range Status   SARS Coronavirus 2 by RT PCR NEGATIVE NEGATIVE Final    Comment: (NOTE) SARS-CoV-2 target nucleic acids are NOT DETECTED.  The SARS-CoV-2 RNA is generally detectable in upper respiratory specimens during the acute phase of infection. The lowest concentration of SARS-CoV-2 viral copies this assay can detect is 138 copies/mL. A negative result does not preclude SARS-Cov-2 infection and should not be used as the sole basis for treatment or other patient management decisions. A negative result may occur with  improper specimen collection/handling, submission of specimen other than nasopharyngeal swab, presence of viral mutation(s) within the areas targeted by this assay, and inadequate number of viral copies(<138 copies/mL). A negative result must be combined with clinical observations, patient history, and epidemiological information. The expected result is Negative.  Fact Sheet for Patients:  EntrepreneurPulse.com.au  Fact Sheet for Healthcare Providers:  IncredibleEmployment.be  This test is no t yet approved or cleared by the [place] FDA and  has been authorized for detection and/or diagnosis of SARS-CoV-2 by FDA under an Emergency Use Authorization (EUA). This EUA will remain  in effect (meaning this test can be used) for the duration of the COVID-19 declaration  under Section 564(b)(1) of the Act, 21 U.S.C.section 360bbb-3(b)(1), unless the authorization is terminated  or revoked sooner.       Influenza A by PCR NEGATIVE NEGATIVE Final   Influenza B by PCR NEGATIVE NEGATIVE Final    Comment: (NOTE) The Xpert Xpress SARS-CoV-2/FLU/RSV plus assay is intended as an aid in the diagnosis of influenza from Nasopharyngeal swab specimens and should not be used as a sole basis for treatment. Nasal washings and aspirates are unacceptable for Xpert Xpress SARS-CoV-2/FLU/RSV testing.  Fact Sheet for Patients: EntrepreneurPulse.com.au  Fact Sheet for Healthcare Providers: IncredibleEmployment.be  This test is not yet approved or cleared by the [place] FDA and has been authorized for detection and/or diagnosis of SARS-CoV-2 by FDA under an Emergency Use Authorization (EUA). This EUA will remain in effect (meaning this test can be used) for  the duration of the COVID-19 declaration under Section 564(b)(1) of the Act, 21 U.S.C. section 360bbb-3(b)(1), unless the authorization is terminated or revoked.  Performed at [facility], [address] [place], [place] [address]   MRSA Next Gen by PCR, Nasal     Status: None   Collection Time: 02/02/21  9:21 AM   Specimen: Nasal Mucosa; Nasal Swab  Result Value Ref Range Status   MRSA by PCR Next Gen NOT DETECTED NOT DETECTED Final    Comment: (NOTE) The GeneXpert MRSA Assay (FDA approved for NASAL specimens only), is one component of a comprehensive MRSA colonization surveillance program. It is not intended to diagnose MRSA infection nor to guide or monitor treatment for MRSA infections. Test performance is not FDA approved in patients less than 41 years old. Performed at [facility], [address] [place], [place] [address]   CULTURE, BLOOD (ROUTINE X 2) w Reflex to ID Panel     Status: Abnormal   Collection Time: 02/02/21  6:07 PM    Specimen: BLOOD  Result Value Ref Range Status   Specimen Description   Final    BLOOD BLOOD RIGHT HAND Performed at [facility], [address], [place], [place] [address]    Special Requests   Final    BOTTLES DRAWN AEROBIC AND ANAEROBIC Blood Culture adequate volume Performed at [facility], [address], [place], [place] [address]    Culture  Setup Time   Final    GRAM NEGATIVE RODS AEROBIC BOTTLE ONLY Organism ID to follow CRITICAL RESULT CALLED TO, READ BACK BY AND VERIFIED WITH: BRANDON BEERS 02/05/21 2049 MU Performed at [facility], [address] [place], [place] [address]    Culture ESCHERICHIA COLI (A)  Final   Report Status 02/08/2021 FINAL  Final   Organism ID, Bacteria ESCHERICHIA COLI  Final      Susceptibility   Escherichia coli - MIC*    AMPICILLIN 16 INTERMEDIATE Intermediate     CEFAZOLIN <=4 SENSITIVE Sensitive     CEFEPIME <=0.12 SENSITIVE Sensitive     CEFTAZIDIME <=1 SENSITIVE Sensitive     CEFTRIAXONE <=0.25 SENSITIVE Sensitive     CIPROFLOXACIN >=4 RESISTANT Resistant     GENTAMICIN <=1 SENSITIVE Sensitive     IMIPENEM 0.5 SENSITIVE Sensitive     TRIMETH/SULFA >=320 RESISTANT Resistant     AMPICILLIN/SULBACTAM 16 INTERMEDIATE Intermediate     PIP/TAZO 8 SENSITIVE Sensitive     * ESCHERICHIA COLI  CULTURE, BLOOD (ROUTINE X 2) w Reflex to ID Panel     Status: None   Collection Time: 02/02/21  6:07 PM   Specimen: BLOOD  Result Value Ref Range Status   Specimen Description BLOOD BLOOD RIGHT FOREARM  Final   Special Requests   Final    BOTTLES DRAWN AEROBIC AND ANAEROBIC Blood Culture adequate volume   Culture   Final    NO GROWTH 5 DAYS Performed at [facility], [address] [place], [place] [address]    Report Status 02/07/2021 FINAL  Final  Blood Culture ID Panel (Reflexed)     Status: Abnormal   Collection Time: 02/02/21  6:07 PM  Result Value Ref Range Status   Enterococcus faecalis NOT  DETECTED NOT DETECTED Final   Enterococcus Faecium NOT DETECTED NOT DETECTED Final   Listeria monocytogenes NOT DETECTED NOT DETECTED Final   Staphylococcus species NOT DETECTED NOT DETECTED Final   Staphylococcus aureus (BCID) NOT DETECTED NOT DETECTED Final  Staphylococcus epidermidis NOT DETECTED NOT DETECTED Final   Staphylococcus lugdunensis NOT DETECTED NOT DETECTED Final   Streptococcus species NOT DETECTED NOT DETECTED Final   Streptococcus agalactiae NOT DETECTED NOT DETECTED Final   Streptococcus pneumoniae NOT DETECTED NOT DETECTED Final   Streptococcus pyogenes NOT DETECTED NOT DETECTED Final   A.calcoaceticus-baumannii NOT DETECTED NOT DETECTED Final   Bacteroides fragilis NOT DETECTED NOT DETECTED Final   Enterobacterales DETECTED (A) NOT DETECTED Final    Comment: Enterobacterales represent a large order of gram negative bacteria, not a single organism. CRITICAL RESULT CALLED TO, READ BACK BY AND VERIFIED WITH: BRANDOM BEERS @2049  ON 02/05/21 MU    Enterobacter cloacae complex NOT DETECTED NOT DETECTED Final   Escherichia coli DETECTED (A) NOT DETECTED Final    Comment: CRITICAL RESULT CALLED TO, READ BACK BY AND VERIFIED WITH: BRANDOM BEERS 02/05/21 2049 MU    Klebsiella aerogenes NOT DETECTED NOT DETECTED Final   Klebsiella oxytoca NOT DETECTED NOT DETECTED Final   Klebsiella pneumoniae NOT DETECTED NOT DETECTED Final   Proteus species NOT DETECTED NOT DETECTED Final   Salmonella species NOT DETECTED NOT DETECTED Final   Serratia marcescens NOT DETECTED NOT DETECTED Final   Haemophilus influenzae NOT DETECTED NOT DETECTED Final   Neisseria meningitidis NOT DETECTED NOT DETECTED Final   Pseudomonas aeruginosa NOT DETECTED NOT DETECTED Final   Stenotrophomonas maltophilia NOT DETECTED NOT DETECTED Final   Candida albicans NOT DETECTED NOT DETECTED Final   Candida auris NOT DETECTED NOT DETECTED Final   Candida glabrata NOT DETECTED NOT DETECTED Final   Candida krusei  NOT DETECTED NOT DETECTED Final   Candida parapsilosis NOT DETECTED NOT DETECTED Final   Candida tropicalis NOT DETECTED NOT DETECTED Final   Cryptococcus neoformans/gattii NOT DETECTED NOT DETECTED Final   CTX-M ESBL NOT DETECTED NOT DETECTED Final   Carbapenem resistance IMP NOT DETECTED NOT DETECTED Final   Carbapenem resistance KPC NOT DETECTED NOT DETECTED Final   Carbapenem resistance NDM NOT DETECTED NOT DETECTED Final   Carbapenem resist OXA 48 LIKE NOT DETECTED NOT DETECTED Final   Carbapenem resistance VIM NOT DETECTED NOT DETECTED Final    Comment: Performed at [facility], [address] [place], [place] [address]  Resp Panel by RT-PCR (Flu A&B, Covid) Nasopharyngeal Swab     Status: None   Collection Time: 02/08/21 10:40 AM   Specimen: Nasopharyngeal Swab; Nasopharyngeal(NP) swabs in vial transport medium  Result Value Ref Range Status   SARS Coronavirus 2 by RT PCR NEGATIVE NEGATIVE Final    Comment: (NOTE) SARS-CoV-2 target nucleic acids are NOT DETECTED.  The SARS-CoV-2 RNA is generally detectable in upper respiratory specimens during the acute phase of infection. The lowest concentration of SARS-CoV-2 viral copies this assay can detect is 138 copies/mL. A negative result does not preclude SARS-Cov-2 infection and should not be used as the sole basis for treatment or other patient management decisions. A negative result may occur with  improper specimen collection/handling, submission of specimen other than nasopharyngeal swab, presence of viral mutation(s) within the areas targeted by this assay, and inadequate number of viral copies(<138 copies/mL). A negative result must be combined with clinical observations, patient history, and epidemiological information. The expected result is Negative.  Fact Sheet for Patients:  EntrepreneurPulse.com.au  Fact Sheet for Healthcare Providers:  IncredibleEmployment.be  This  test is no t yet approved or cleared by the [place] FDA and  has been authorized for detection and/or diagnosis of SARS-CoV-2 by FDA under an Emergency  Use Authorization (EUA). This EUA will remain  in effect (meaning this test can be used) for the duration of the COVID-19 declaration under Section 564(b)(1) of the Act, 21 U.S.C.section 360bbb-3(b)(1), unless the authorization is terminated  or revoked sooner.       Influenza A by PCR NEGATIVE NEGATIVE Final   Influenza B by PCR NEGATIVE NEGATIVE Final    Comment: (NOTE) The Xpert Xpress SARS-CoV-2/FLU/RSV plus assay is intended as an aid in the diagnosis of influenza from Nasopharyngeal swab specimens and should not be used as a sole basis for treatment. Nasal washings and aspirates are unacceptable for Xpert Xpress SARS-CoV-2/FLU/RSV testing.  Fact Sheet for Patients: EntrepreneurPulse.com.au  Fact Sheet for Healthcare Providers: IncredibleEmployment.be  This test is not yet approved or cleared by the [place] FDA and has been authorized for detection and/or diagnosis of SARS-CoV-2 by FDA under an Emergency Use Authorization (EUA). This EUA will remain in effect (meaning this test can be used) for the duration of the COVID-19 declaration under Section 564(b)(1) of the Act, 21 U.S.C. section 360bbb-3(b)(1), unless the authorization is terminated or revoked.  Performed at [facility], [address] [place], [place] [address]      Labs: BNP (last 3 results) Recent Labs    01/31/21 0909  BNP 6,301.6*   Basic Metabolic Panel: Recent Labs  Lab 02/02/21 1807 02/03/21 0501  NA 136 133*  K 4.4 4.5  CL 100 98  CO2 25 25  GLUCOSE 85 89  BUN 47* 54*  CREATININE 5.06* 5.56*  CALCIUM 8.7* 8.1*  MG  --  1.8   Liver Function Tests: Recent Labs  Lab 02/02/21 1807 02/03/21 1302 02/05/21 0416  AST 45* 34 15  ALT 38 30 18  ALKPHOS 457* 364* 268*  BILITOT  3.8* 2.6* 1.7*  PROT 5.5* 5.5* 5.2*  ALBUMIN 2.9* 2.7* 2.4*   No results for input(s): LIPASE, AMYLASE in the last 168 hours. No results for input(s): AMMONIA in the last 168 hours. CBC: Recent Labs  Lab 02/02/21 1807 02/03/21 0501 02/03/21 1302 02/04/21 0415 02/05/21 0416 02/06/21 0437 02/07/21 0544 02/08/21 0352  WBC 4.6   < > 6.0 4.0 4.9 6.2 5.2 5.9  NEUTROABS 3.9  --  4.4  --   --   --   --   --   HGB 10.1*   < > 9.6* 9.1* 9.6* 9.3* 9.6* 10.0*  HCT 31.4*   < > 30.2* 28.0* 29.8* 29.1* 30.3* 32.0*  MCV 94.0   < > 94.7 94.3 94.9 93.0 93.8 93.8  PLT 41*   < > 57* 41* 51* 44* 52* 65*   < > = values in this interval not displayed.   Cardiac Enzymes: No results for input(s): CKTOTAL, CKMB, CKMBINDEX, TROPONINI in the last 168 hours. BNP: Invalid input(s): POCBNP CBG: Recent Labs  Lab 02/06/21 1126 02/06/21 2138 02/07/21 0730 02/07/21 1200 02/07/21 1624  GLUCAP 118* 128* 105* 147* 159*   D-Dimer No results for input(s): DDIMER in the last 72 hours. Hgb A1c No results for input(s): HGBA1C in the last 72 hours. Lipid Profile No results for input(s): CHOL, HDL, LDLCALC, TRIG, CHOLHDL, LDLDIRECT in the last 72 hours. Thyroid function studies No results for input(s): TSH, T4TOTAL, T3FREE, THYROIDAB in the last 72 hours.  Invalid input(s): FREET3 Anemia work up No results for input(s): VITAMINB12, FOLATE, FERRITIN, TIBC, IRON, RETICCTPCT in the last 72 hours. Urinalysis    Component Value Date/Time   COLORURINE YELLOW (A) 09/27/2020 1455  APPEARANCEUR HAZY (A) 09/27/2020 1455   APPEARANCEUR Hazy 11/14/2012 2044   LABSPEC 1.009 09/27/2020 1455   LABSPEC 1.016 11/14/2012 2044   PHURINE 9.0 (H) 09/27/2020 1455   GLUCOSEU 150 (A) 09/27/2020 1455   GLUCOSEU 150 mg/dL 11/14/2012 2044   HGBUR SMALL (A) 09/27/2020 1455   BILIRUBINUR NEGATIVE 09/27/2020 1455   BILIRUBINUR Negative 11/14/2012 2044   KETONESUR NEGATIVE 09/27/2020 1455   PROTEINUR 100 (A) 09/27/2020 1455    NITRITE NEGATIVE 09/27/2020 1455   LEUKOCYTESUR LARGE (A) 09/27/2020 1455   LEUKOCYTESUR Trace 11/14/2012 2044   Sepsis Labs Invalid input(s): PROCALCITONIN,  WBC,  LACTICIDVEN Microbiology Recent Results (from the past 240 hour(s))  Resp Panel by RT-PCR (Flu A&B, Covid) Nasopharyngeal Swab     Status: None   Collection Time: 01/31/21  9:11 AM   Specimen: Nasopharyngeal Swab; Nasopharyngeal(NP) swabs in vial transport medium  Result Value Ref Range Status   SARS Coronavirus 2 by RT PCR NEGATIVE NEGATIVE Final    Comment: (NOTE) SARS-CoV-2 target nucleic acids are NOT DETECTED.  The SARS-CoV-2 RNA is generally detectable in upper respiratory specimens during the acute phase of infection. The lowest concentration of SARS-CoV-2 viral copies this assay can detect is 138 copies/mL. A negative result does not preclude SARS-Cov-2 infection and should not be used as the sole basis for treatment or other patient management decisions. A negative result may occur with  improper specimen collection/handling, submission of specimen other than nasopharyngeal swab, presence of viral mutation(s) within the areas targeted by this assay, and inadequate number of viral copies(<138 copies/mL). A negative result must be combined with clinical observations, patient history, and epidemiological information. The expected result is Negative.  Fact Sheet for Patients:  EntrepreneurPulse.com.au  Fact Sheet for Healthcare Providers:  IncredibleEmployment.be  This test is no t yet approved or cleared by the [place] FDA and  has been authorized for detection and/or diagnosis of SARS-CoV-2 by FDA under an Emergency Use Authorization (EUA). This EUA will remain  in effect (meaning this test can be used) for the duration of the COVID-19 declaration under Section 564(b)(1) of the Act, 21 U.S.C.section 360bbb-3(b)(1), unless the authorization is terminated  or  revoked sooner.       Influenza A by PCR NEGATIVE NEGATIVE Final   Influenza B by PCR NEGATIVE NEGATIVE Final    Comment: (NOTE) The Xpert Xpress SARS-CoV-2/FLU/RSV plus assay is intended as an aid in the diagnosis of influenza from Nasopharyngeal swab specimens and should not be used as a sole basis for treatment. Nasal washings and aspirates are unacceptable for Xpert Xpress SARS-CoV-2/FLU/RSV testing.  Fact Sheet for Patients: EntrepreneurPulse.com.au  Fact Sheet for Healthcare Providers: IncredibleEmployment.be  This test is not yet approved or cleared by the [place] FDA and has been authorized for detection and/or diagnosis of SARS-CoV-2 by FDA under an Emergency Use Authorization (EUA). This EUA will remain in effect (meaning this test can be used) for the duration of the COVID-19 declaration under Section 564(b)(1) of the Act, 21 U.S.C. section 360bbb-3(b)(1), unless the authorization is terminated or revoked.  Performed at [facility], [address] [place], [place] [address]   MRSA Next Gen by PCR, Nasal     Status: None   Collection Time: 02/02/21  9:21 AM   Specimen: Nasal Mucosa; Nasal Swab  Result Value Ref Range Status   MRSA by PCR Next Gen NOT DETECTED NOT DETECTED Final    Comment: (NOTE) The GeneXpert MRSA Assay (FDA approved for NASAL specimens  only), is one component of a comprehensive MRSA colonization surveillance program. It is not intended to diagnose MRSA infection nor to guide or monitor treatment for MRSA infections. Test performance is not FDA approved in patients less than 65 years old. Performed at [facility], [address] [place], [place] [address]   CULTURE, BLOOD (ROUTINE X 2) w Reflex to ID Panel     Status: Abnormal   Collection Time: 02/02/21  6:07 PM   Specimen: BLOOD  Result Value Ref Range Status   Specimen Description   Final    BLOOD BLOOD RIGHT  HAND Performed at [facility], [address], [place], [place] [address]    Special Requests   Final    BOTTLES DRAWN AEROBIC AND ANAEROBIC Blood Culture adequate volume Performed at [facility], [address] [place], [place] [address]    Culture  Setup Time   Final    GRAM NEGATIVE RODS AEROBIC BOTTLE ONLY Organism ID to follow CRITICAL RESULT CALLED TO, READ BACK BY AND VERIFIED WITH: BRANDON BEERS 02/05/21 2049 MU Performed at [facility], [address] [place], [place] [address]    Culture ESCHERICHIA COLI (A)  Final   Report Status 02/08/2021 FINAL  Final   Organism ID, Bacteria ESCHERICHIA COLI  Final      Susceptibility   Escherichia coli - MIC*    AMPICILLIN 16 INTERMEDIATE Intermediate     CEFAZOLIN <=4 SENSITIVE Sensitive     CEFEPIME <=0.12 SENSITIVE Sensitive     CEFTAZIDIME <=1 SENSITIVE Sensitive     CEFTRIAXONE <=0.25 SENSITIVE Sensitive     CIPROFLOXACIN >=4 RESISTANT Resistant     GENTAMICIN <=1 SENSITIVE Sensitive     IMIPENEM 0.5 SENSITIVE Sensitive     TRIMETH/SULFA >=320 RESISTANT Resistant     AMPICILLIN/SULBACTAM 16 INTERMEDIATE Intermediate     PIP/TAZO 8 SENSITIVE Sensitive     * ESCHERICHIA COLI  CULTURE, BLOOD (ROUTINE X 2) w Reflex to ID Panel     Status: None   Collection Time: 02/02/21  6:07 PM   Specimen: BLOOD  Result Value Ref Range Status   Specimen Description BLOOD BLOOD RIGHT FOREARM  Final   Special Requests   Final    BOTTLES DRAWN AEROBIC AND ANAEROBIC Blood Culture adequate volume   Culture   Final    NO GROWTH 5 DAYS Performed at [facility], [address] [place], [place] [address]    Report Status 02/07/2021 FINAL  Final  Blood Culture ID Panel (Reflexed)     Status: Abnormal   Collection Time: 02/02/21  6:07 PM  Result Value Ref Range Status   Enterococcus faecalis NOT DETECTED NOT DETECTED Final   Enterococcus Faecium NOT DETECTED NOT DETECTED Final   Listeria monocytogenes  NOT DETECTED NOT DETECTED Final   Staphylococcus species NOT DETECTED NOT DETECTED Final   Staphylococcus aureus (BCID) NOT DETECTED NOT DETECTED Final   Staphylococcus epidermidis NOT DETECTED NOT DETECTED Final   Staphylococcus lugdunensis NOT DETECTED NOT DETECTED Final   Streptococcus species NOT DETECTED NOT DETECTED Final   Streptococcus agalactiae NOT DETECTED NOT DETECTED Final   Streptococcus pneumoniae NOT DETECTED NOT DETECTED Final   Streptococcus pyogenes NOT DETECTED NOT DETECTED Final   A.calcoaceticus-baumannii NOT DETECTED NOT DETECTED Final   Bacteroides fragilis NOT DETECTED NOT DETECTED Final   Enterobacterales DETECTED (A) NOT DETECTED Final    Comment: Enterobacterales represent a large order of gram negative bacteria, not a single organism. CRITICAL RESULT CALLED  TO, READ BACK BY AND VERIFIED WITH: BRANDOM BEERS @2049  ON 02/05/21 MU    Enterobacter cloacae complex NOT DETECTED NOT DETECTED Final   Escherichia coli DETECTED (A) NOT DETECTED Final    Comment: CRITICAL RESULT CALLED TO, READ BACK BY AND VERIFIED WITH: BRANDOM BEERS 02/05/21 2049 MU    Klebsiella aerogenes NOT DETECTED NOT DETECTED Final   Klebsiella oxytoca NOT DETECTED NOT DETECTED Final   Klebsiella pneumoniae NOT DETECTED NOT DETECTED Final   Proteus species NOT DETECTED NOT DETECTED Final   Salmonella species NOT DETECTED NOT DETECTED Final   Serratia marcescens NOT DETECTED NOT DETECTED Final   Haemophilus influenzae NOT DETECTED NOT DETECTED Final   Neisseria meningitidis NOT DETECTED NOT DETECTED Final   Pseudomonas aeruginosa NOT DETECTED NOT DETECTED Final   Stenotrophomonas maltophilia NOT DETECTED NOT DETECTED Final   Candida albicans NOT DETECTED NOT DETECTED Final   Candida auris NOT DETECTED NOT DETECTED Final   Candida glabrata NOT DETECTED NOT DETECTED Final   Candida krusei NOT DETECTED NOT DETECTED Final   Candida parapsilosis NOT DETECTED NOT DETECTED Final   Candida tropicalis  NOT DETECTED NOT DETECTED Final   Cryptococcus neoformans/gattii NOT DETECTED NOT DETECTED Final   CTX-M ESBL NOT DETECTED NOT DETECTED Final   Carbapenem resistance IMP NOT DETECTED NOT DETECTED Final   Carbapenem resistance KPC NOT DETECTED NOT DETECTED Final   Carbapenem resistance NDM NOT DETECTED NOT DETECTED Final   Carbapenem resist OXA 48 LIKE NOT DETECTED NOT DETECTED Final   Carbapenem resistance VIM NOT DETECTED NOT DETECTED Final    Comment: Performed at [facility], [address] [place], [place] [address]  Resp Panel by RT-PCR (Flu A&B, Covid) Nasopharyngeal Swab     Status: None   Collection Time: 02/08/21 10:40 AM   Specimen: Nasopharyngeal Swab; Nasopharyngeal(NP) swabs in vial transport medium  Result Value Ref Range Status   SARS Coronavirus 2 by RT PCR NEGATIVE NEGATIVE Final    Comment: (NOTE) SARS-CoV-2 target nucleic acids are NOT DETECTED.  The SARS-CoV-2 RNA is generally detectable in upper respiratory specimens during the acute phase of infection. The lowest concentration of SARS-CoV-2 viral copies this assay can detect is 138 copies/mL. A negative result does not preclude SARS-Cov-2 infection and should not be used as the sole basis for treatment or other patient management decisions. A negative result may occur with  improper specimen collection/handling, submission of specimen other than nasopharyngeal swab, presence of viral mutation(s) within the areas targeted by this assay, and inadequate number of viral copies(<138 copies/mL). A negative result must be combined with clinical observations, patient history, and epidemiological information. The expected result is Negative.  Fact Sheet for Patients:  EntrepreneurPulse.com.au  Fact Sheet for Healthcare Providers:  IncredibleEmployment.be  This test is no t yet approved or cleared by the [place] FDA and  has been authorized for detection and/or  diagnosis of SARS-CoV-2 by FDA under an Emergency Use Authorization (EUA). This EUA will remain  in effect (meaning this test can be used) for the duration of the COVID-19 declaration under Section 564(b)(1) of the Act, 21 U.S.C.section 360bbb-3(b)(1), unless the authorization is terminated  or revoked sooner.       Influenza A by PCR NEGATIVE NEGATIVE Final   Influenza B by PCR NEGATIVE NEGATIVE Final    Comment: (NOTE) The Xpert Xpress SARS-CoV-2/FLU/RSV plus assay is intended as an aid in the diagnosis of influenza from Nasopharyngeal swab specimens and should not be used as a sole basis  for treatment. Nasal washings and aspirates are unacceptable for Xpert Xpress SARS-CoV-2/FLU/RSV testing.  Fact Sheet for Patients: EntrepreneurPulse.com.au  Fact Sheet for Healthcare Providers: IncredibleEmployment.be  This test is not yet approved or cleared by the [place] FDA and has been authorized for detection and/or diagnosis of SARS-CoV-2 by FDA under an Emergency Use Authorization (EUA). This EUA will remain in effect (meaning this test can be used) for the duration of the COVID-19 declaration under Section 564(b)(1) of the Act, 21 U.S.C. section 360bbb-3(b)(1), unless the authorization is terminated or revoked.  Performed at [facility], [address] [place], [place] [address]     Time coordinating discharge: Over 30 minutes  SIGNED:  Lorella Nimrod, MD  [facility] 02/09/2021, 10:56 AM  If 7PM-7AM, please contact night-coverage [email or web address]  This record has been created using Systems analyst. Errors have been sought and corrected,but may not always be located. Such creation errors do not reflect on the standard of care.

## 2021-02-10 DIAGNOSIS — Z5189 Encounter for other specified aftercare: Secondary | ICD-10-CM | POA: Diagnosis not present

## 2021-02-10 DIAGNOSIS — S71001A Unspecified open wound, right hip, initial encounter: Secondary | ICD-10-CM | POA: Diagnosis not present

## 2021-02-10 DIAGNOSIS — N186 End stage renal disease: Secondary | ICD-10-CM | POA: Diagnosis not present

## 2021-02-10 DIAGNOSIS — L98499 Non-pressure chronic ulcer of skin of other sites with unspecified severity: Secondary | ICD-10-CM | POA: Diagnosis not present

## 2021-02-10 DIAGNOSIS — I70209 Unspecified atherosclerosis of native arteries of extremities, unspecified extremity: Secondary | ICD-10-CM | POA: Diagnosis not present

## 2021-02-16 DIAGNOSIS — Z89612 Acquired absence of left leg above knee: Secondary | ICD-10-CM | POA: Diagnosis not present

## 2021-02-16 DIAGNOSIS — E785 Hyperlipidemia, unspecified: Secondary | ICD-10-CM | POA: Diagnosis not present

## 2021-02-16 DIAGNOSIS — Z89511 Acquired absence of right leg below knee: Secondary | ICD-10-CM | POA: Diagnosis not present

## 2021-02-16 DIAGNOSIS — L98499 Non-pressure chronic ulcer of skin of other sites with unspecified severity: Secondary | ICD-10-CM | POA: Diagnosis not present

## 2021-02-16 DIAGNOSIS — E1149 Type 2 diabetes mellitus with other diabetic neurological complication: Secondary | ICD-10-CM | POA: Diagnosis not present

## 2021-02-18 ENCOUNTER — Other Ambulatory Visit: Payer: Self-pay

## 2021-02-18 DIAGNOSIS — Z4582 Encounter for adjustment or removal of myringotomy device (stent) (tube): Secondary | ICD-10-CM

## 2021-02-20 ENCOUNTER — Emergency Department: Payer: Medicare Other

## 2021-02-20 ENCOUNTER — Encounter: Payer: Self-pay | Admitting: Emergency Medicine

## 2021-02-20 ENCOUNTER — Inpatient Hospital Stay
Admission: EM | Admit: 2021-02-20 | Discharge: 2021-03-03 | DRG: 500 | Discharge status: Against Medical Advice | Payer: Medicare Other | Source: Ambulatory Visit | Attending: Internal Medicine | Admitting: Internal Medicine

## 2021-02-20 ENCOUNTER — Other Ambulatory Visit: Payer: Self-pay

## 2021-02-20 DIAGNOSIS — A419 Sepsis, unspecified organism: Secondary | ICD-10-CM | POA: Diagnosis not present

## 2021-02-20 DIAGNOSIS — I509 Heart failure, unspecified: Secondary | ICD-10-CM

## 2021-02-20 DIAGNOSIS — R4182 Altered mental status, unspecified: Secondary | ICD-10-CM | POA: Diagnosis not present

## 2021-02-20 DIAGNOSIS — D65 Disseminated intravascular coagulation [defibrination syndrome]: Secondary | ICD-10-CM | POA: Diagnosis present

## 2021-02-20 DIAGNOSIS — I429 Cardiomyopathy, unspecified: Secondary | ICD-10-CM | POA: Diagnosis present

## 2021-02-20 DIAGNOSIS — Z96649 Presence of unspecified artificial hip joint: Secondary | ICD-10-CM

## 2021-02-20 DIAGNOSIS — J9610 Chronic respiratory failure, unspecified whether with hypoxia or hypercapnia: Secondary | ICD-10-CM | POA: Diagnosis present

## 2021-02-20 DIAGNOSIS — R6520 Severe sepsis without septic shock: Secondary | ICD-10-CM | POA: Diagnosis not present

## 2021-02-20 DIAGNOSIS — E669 Obesity, unspecified: Secondary | ICD-10-CM | POA: Diagnosis present

## 2021-02-20 DIAGNOSIS — K921 Melena: Secondary | ICD-10-CM | POA: Diagnosis not present

## 2021-02-20 DIAGNOSIS — R161 Splenomegaly, not elsewhere classified: Secondary | ICD-10-CM | POA: Diagnosis present

## 2021-02-20 DIAGNOSIS — Z955 Presence of coronary angioplasty implant and graft: Secondary | ICD-10-CM

## 2021-02-20 DIAGNOSIS — I9589 Other hypotension: Secondary | ICD-10-CM | POA: Diagnosis present

## 2021-02-20 DIAGNOSIS — Z66 Do not resuscitate: Secondary | ICD-10-CM | POA: Diagnosis present

## 2021-02-20 DIAGNOSIS — Z20822 Contact with and (suspected) exposure to covid-19: Secondary | ICD-10-CM | POA: Diagnosis present

## 2021-02-20 DIAGNOSIS — I21A1 Myocardial infarction type 2: Secondary | ICD-10-CM | POA: Diagnosis present

## 2021-02-20 DIAGNOSIS — I248 Other forms of acute ischemic heart disease: Secondary | ICD-10-CM | POA: Diagnosis present

## 2021-02-20 DIAGNOSIS — R509 Fever, unspecified: Secondary | ICD-10-CM

## 2021-02-20 DIAGNOSIS — I5043 Acute on chronic combined systolic (congestive) and diastolic (congestive) heart failure: Secondary | ICD-10-CM | POA: Diagnosis present

## 2021-02-20 DIAGNOSIS — T8451XA Infection and inflammatory reaction due to internal right hip prosthesis, initial encounter: Secondary | ICD-10-CM | POA: Diagnosis not present

## 2021-02-20 DIAGNOSIS — E875 Hyperkalemia: Secondary | ICD-10-CM | POA: Diagnosis present

## 2021-02-20 DIAGNOSIS — N179 Acute kidney failure, unspecified: Secondary | ICD-10-CM | POA: Diagnosis present

## 2021-02-20 DIAGNOSIS — L0291 Cutaneous abscess, unspecified: Secondary | ICD-10-CM

## 2021-02-20 DIAGNOSIS — I4811 Longstanding persistent atrial fibrillation: Secondary | ICD-10-CM

## 2021-02-20 DIAGNOSIS — L03116 Cellulitis of left lower limb: Secondary | ICD-10-CM | POA: Diagnosis present

## 2021-02-20 DIAGNOSIS — Z96642 Presence of left artificial hip joint: Secondary | ICD-10-CM | POA: Diagnosis present

## 2021-02-20 DIAGNOSIS — N390 Urinary tract infection, site not specified: Secondary | ICD-10-CM | POA: Diagnosis present

## 2021-02-20 DIAGNOSIS — Z1621 Resistance to vancomycin: Secondary | ICD-10-CM | POA: Diagnosis present

## 2021-02-20 DIAGNOSIS — I25110 Atherosclerotic heart disease of native coronary artery with unstable angina pectoris: Secondary | ICD-10-CM | POA: Diagnosis present

## 2021-02-20 DIAGNOSIS — G928 Other toxic encephalopathy: Secondary | ICD-10-CM | POA: Diagnosis present

## 2021-02-20 DIAGNOSIS — I214 Non-ST elevation (NSTEMI) myocardial infarction: Secondary | ICD-10-CM | POA: Diagnosis not present

## 2021-02-20 DIAGNOSIS — E1122 Type 2 diabetes mellitus with diabetic chronic kidney disease: Secondary | ICD-10-CM | POA: Diagnosis present

## 2021-02-20 DIAGNOSIS — K8020 Calculus of gallbladder without cholecystitis without obstruction: Secondary | ICD-10-CM | POA: Diagnosis not present

## 2021-02-20 DIAGNOSIS — Y831 Surgical operation with implant of artificial internal device as the cause of abnormal reaction of the patient, or of later complication, without mention of misadventure at the time of the procedure: Secondary | ICD-10-CM | POA: Diagnosis present

## 2021-02-20 DIAGNOSIS — Z7901 Long term (current) use of anticoagulants: Secondary | ICD-10-CM

## 2021-02-20 DIAGNOSIS — B957 Other staphylococcus as the cause of diseases classified elsewhere: Secondary | ICD-10-CM | POA: Diagnosis present

## 2021-02-20 DIAGNOSIS — E1151 Type 2 diabetes mellitus with diabetic peripheral angiopathy without gangrene: Secondary | ICD-10-CM | POA: Diagnosis present

## 2021-02-20 DIAGNOSIS — N186 End stage renal disease: Secondary | ICD-10-CM | POA: Diagnosis not present

## 2021-02-20 DIAGNOSIS — Z79899 Other long term (current) drug therapy: Secondary | ICD-10-CM

## 2021-02-20 DIAGNOSIS — D631 Anemia in chronic kidney disease: Secondary | ICD-10-CM | POA: Diagnosis present

## 2021-02-20 DIAGNOSIS — L02415 Cutaneous abscess of right lower limb: Secondary | ICD-10-CM | POA: Diagnosis present

## 2021-02-20 DIAGNOSIS — R188 Other ascites: Secondary | ICD-10-CM | POA: Diagnosis present

## 2021-02-20 DIAGNOSIS — E876 Hypokalemia: Secondary | ICD-10-CM | POA: Diagnosis present

## 2021-02-20 DIAGNOSIS — I252 Old myocardial infarction: Secondary | ICD-10-CM

## 2021-02-20 DIAGNOSIS — G9341 Metabolic encephalopathy: Secondary | ICD-10-CM | POA: Diagnosis present

## 2021-02-20 DIAGNOSIS — Z6832 Body mass index (BMI) 32.0-32.9, adult: Secondary | ICD-10-CM

## 2021-02-20 DIAGNOSIS — Z888 Allergy status to other drugs, medicaments and biological substances status: Secondary | ICD-10-CM

## 2021-02-20 DIAGNOSIS — Z89512 Acquired absence of left leg below knee: Secondary | ICD-10-CM

## 2021-02-20 DIAGNOSIS — E785 Hyperlipidemia, unspecified: Secondary | ICD-10-CM | POA: Diagnosis present

## 2021-02-20 DIAGNOSIS — Z89511 Acquired absence of right leg below knee: Secondary | ICD-10-CM

## 2021-02-20 DIAGNOSIS — D62 Acute posthemorrhagic anemia: Secondary | ICD-10-CM | POA: Diagnosis not present

## 2021-02-20 DIAGNOSIS — Z7902 Long term (current) use of antithrombotics/antiplatelets: Secondary | ICD-10-CM

## 2021-02-20 DIAGNOSIS — R531 Weakness: Secondary | ICD-10-CM | POA: Diagnosis not present

## 2021-02-20 DIAGNOSIS — J189 Pneumonia, unspecified organism: Secondary | ICD-10-CM | POA: Diagnosis present

## 2021-02-20 DIAGNOSIS — Z992 Dependence on renal dialysis: Secondary | ICD-10-CM

## 2021-02-20 DIAGNOSIS — I4821 Permanent atrial fibrillation: Secondary | ICD-10-CM | POA: Diagnosis present

## 2021-02-20 DIAGNOSIS — I110 Hypertensive heart disease with heart failure: Secondary | ICD-10-CM | POA: Diagnosis not present

## 2021-02-20 LAB — CBC WITH DIFFERENTIAL/PLATELET
Abs Immature Granulocytes: 0.03 10*3/uL (ref 0.00–0.07)
Basophils Absolute: 0 10*3/uL (ref 0.0–0.1)
Basophils Relative: 0 %
Eosinophils Absolute: 0 10*3/uL (ref 0.0–0.5)
Eosinophils Relative: 0 %
HCT: 36.7 % — ABNORMAL LOW (ref 39.0–52.0)
Hemoglobin: 11.3 g/dL — ABNORMAL LOW (ref 13.0–17.0)
Immature Granulocytes: 1 %
Lymphocytes Relative: 16 %
Lymphs Abs: 0.8 10*3/uL (ref 0.7–4.0)
MCH: 30.1 pg (ref 26.0–34.0)
MCHC: 30.8 g/dL (ref 30.0–36.0)
MCV: 97.6 fL (ref 80.0–100.0)
Monocytes Absolute: 0.5 10*3/uL (ref 0.1–1.0)
Monocytes Relative: 8 %
Neutro Abs: 4 10*3/uL (ref 1.7–7.7)
Neutrophils Relative %: 75 %
Platelets: 68 10*3/uL — ABNORMAL LOW (ref 150–400)
RBC: 3.76 MIL/uL — ABNORMAL LOW (ref 4.22–5.81)
RDW: 20.5 % — ABNORMAL HIGH (ref 11.5–15.5)
Smear Review: DECREASED
WBC: 5.4 10*3/uL (ref 4.0–10.5)
nRBC: 0.4 % — ABNORMAL HIGH (ref 0.0–0.2)

## 2021-02-20 LAB — COMPREHENSIVE METABOLIC PANEL
ALT: 9 U/L (ref 0–44)
AST: 18 U/L (ref 15–41)
Albumin: 3.1 g/dL — ABNORMAL LOW (ref 3.5–5.0)
Alkaline Phosphatase: 150 U/L — ABNORMAL HIGH (ref 38–126)
Anion gap: 12 (ref 5–15)
BUN: 24 mg/dL — ABNORMAL HIGH (ref 8–23)
CO2: 26 mmol/L (ref 22–32)
Calcium: 9.4 mg/dL (ref 8.9–10.3)
Chloride: 100 mmol/L (ref 98–111)
Creatinine, Ser: 3.94 mg/dL — ABNORMAL HIGH (ref 0.61–1.24)
GFR, Estimated: 15 mL/min — ABNORMAL LOW (ref >60)
Glucose, Bld: 124 mg/dL — ABNORMAL HIGH (ref 70–99)
Potassium: 3 mmol/L — ABNORMAL LOW (ref 3.5–5.1)
Sodium: 138 mmol/L (ref 135–145)
Total Bilirubin: 2.2 mg/dL — ABNORMAL HIGH (ref 0.3–1.2)
Total Protein: 6.8 g/dL (ref 6.5–8.1)

## 2021-02-20 LAB — BRAIN NATRIURETIC PEPTIDE: B Natriuretic Peptide: >4500 pg/mL — ABNORMAL HIGH (ref 0.0–100.0)

## 2021-02-20 LAB — RESP PANEL BY RT-PCR (FLU A&B, COVID) ARPGX2
Influenza A by PCR: NEGATIVE
Influenza B by PCR: NEGATIVE
SARS Coronavirus 2 by RT PCR: NEGATIVE

## 2021-02-20 LAB — TROPONIN I (HIGH SENSITIVITY)
Troponin I (High Sensitivity): 1557 ng/L (ref <18)
Troponin I (High Sensitivity): 2463 ng/L (ref <18)

## 2021-02-20 MED ORDER — VANCOMYCIN HCL IN DEXTROSE 1-5 GM/200ML-% IV SOLN
1000.0000 mg | Freq: Once | INTRAVENOUS | Status: AC
  Administered 2021-02-21: 1000 mg via INTRAVENOUS
  Filled 2021-02-20: qty 200

## 2021-02-20 MED ORDER — ASPIRIN 81 MG PO CHEW
324.0000 mg | CHEWABLE_TABLET | Freq: Once | ORAL | Status: AC
  Administered 2021-02-21: 81 mg via ORAL
  Filled 2021-02-20: qty 4

## 2021-02-20 MED ORDER — LACTATED RINGERS IV SOLN: INTRAVENOUS | Status: DC

## 2021-02-20 MED ORDER — SODIUM CHLORIDE 0.9 % IV SOLN
2.0000 g | Freq: Once | INTRAVENOUS | Status: AC
  Administered 2021-02-20: 2 g via INTRAVENOUS

## 2021-02-20 MED ORDER — SODIUM CHLORIDE 0.9 % IV BOLUS (SEPSIS): 500.0000 mL | Freq: Once | INTRAVENOUS | Status: DC

## 2021-02-20 MED ORDER — METRONIDAZOLE 500 MG/100ML IV SOLN
500.0000 mg | Freq: Once | INTRAVENOUS | Status: AC
  Administered 2021-02-20: 500 mg via INTRAVENOUS
  Filled 2021-02-20: qty 100

## 2021-02-20 MED ORDER — VANCOMYCIN HCL 1500 MG/300ML IV SOLN
1500.0000 mg | Freq: Once | INTRAVENOUS | Status: AC
  Administered 2021-02-21: 02:00:00 1500 mg via INTRAVENOUS
  Filled 2021-02-20: qty 300

## 2021-02-20 MED ORDER — VANCOMYCIN HCL IN DEXTROSE 1-5 GM/200ML-% IV SOLN: 1000.0000 mg | Freq: Once | INTRAVENOUS | Status: DC

## 2021-02-20 NOTE — Progress Notes (Signed)
PHARMACY -  BRIEF ANTIBIOTIC NOTE   Pharmacy has received consult(s) for cefepime and vancomycin from an ED provider.  The patient's profile has been reviewed for ht/wt/allergies/indication/available labs.    One time order(s) placed for: Cefepime 2 g IV Vancomycin 2.5 g IV  Further antibiotics/pharmacy consults should be ordered by admitting physician if indicated.                       Thank you, Forde Dandy Jai Bear 02/20/2021  9:02 PM

## 2021-02-20 NOTE — ED Provider Notes (Addendum)
[facility] Department Provider Note    Event Date/Time   First MD Initiated Contact with Patient 02/20/21 2051     (approximate)  I have reviewed the triage vital signs and the nursing notes.   HISTORY  Chief Complaint Altered Mental Status History limited by altered mental status   HPI RAMESES OU is a 75 y.o. male dialysis patient with altered mental status.  He is hot to touch.  Initially A. fib with RVR still with A. fib with RVR on arrival into the emergency room her heart rate is only 117 at this time.  Temperature is 100.4 O2 sats were 88%.  Patient is very altered and not his usual self at all he is unable to tell us much of anything.         Past Medical History:  Diagnosis Date   Demand ischemia ([place])    a. 11/2017 elev Trop in setting of AFib/SVT-->Echo [facility]): EF>55%. Mild LVH. Nl RV fxn.   Diabetes ([place])    a. Pt states that he has no hx of diabetes--A1c 5.5 11/2018.   ESRD (end stage renal disease) ([place])    a. On HD since 2019.   Hyperlipidemia    Hypotension    a. On midodrine.   Morbid obesity ([place])    PAD (peripheral artery disease) ([place])    a. s/p L AKA and R BKA.   Permanent atrial fibrillation ([place])    a. Dx 2019. CHA2DS2VASc = 3-4-->Eliquis.    Patient Active Problem List   Diagnosis Date Noted   Acute metabolic encephalopathy 28/31/5176   Pain    Normocytic anemia    Atrial fibrillation with rapid ventricular response ([place])    Preop cardiovascular exam    Abdominal pain 01/31/2021   Choledocholithiasis 01/31/2021   Morbid obesity ([place])    Sepsis due to Escherichia coli [facility])    Wound of skin    Chronic hypotension    Demand ischemia (HCC)    Permanent atrial fibrillation ([place])    Chest pain 09/26/2020   PVD (peripheral vascular disease) ([place])    Shortness of breath 09/20/2020   NSTEMI (non-ST elevated myocardial infarction) ([place]) 09/20/2020   Fungal skin infection    Atrial fibrillation,  chronic (HCC)    Acquired thrombophilia (HCC)    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[place])    Anemia of chronic disease    Thrombocytopenia ([place])    Sacral wound 01/13/2020   Pressure injury of skin 01/13/2020   Generalized weakness 01/13/2020   ESRD (end stage renal disease) ([place]) 04/25/2018    Past Surgical History:  Procedure Laterality Date   AV FISTULA REPAIR     BELOW KNEE LEG AMPUTATION Bilateral    CHOLECYSTECTOMY     ERCP N/A 02/04/2021   Procedure: ENDOSCOPIC RETROGRADE CHOLANGIOPANCREATOGRAPHY (ERCP);  Surgeon: Lucilla Lame, MD;  Location: [facility] ENDOSCOPY;  Service: Endoscopy;  Laterality: N/A;   LEFT HEART CATH AND CORONARY ANGIOGRAPHY N/A 09/22/2020   Procedure: LEFT HEART CATH AND CORONARY ANGIOGRAPHY;  Surgeon: Sherren Mocha, MD;  Location: [facility] CV LAB;  Service: Cardiovascular;  Laterality: N/A;   TOTAL HIP ARTHROPLASTY Bilateral     Prior to Admission medications   Medication Sig Start Date End Date Taking? Authorizing Provider  acetaminophen (TYLENOL) 325 MG tablet Take 2 tablets (650 mg total) by mouth every 6 (six) hours as needed for mild pain or moderate pain. 01/17/20  Yes Loletha Grayer, MD  apixaban (ELIQUIS) 5 MG TABS tablet Take 1 tablet (5 mg total) by mouth 2 (two) times daily. 01/17/20  Yes Wieting, Richard, MD  atorvastatin (LIPITOR) 40 MG tablet Take 40 mg by mouth at bedtime. 09/17/20  Yes [provider]  Carboxymethylcellulose Sod PF 0.5 % SOLN Place 1 drop into both eyes 3 (three) times daily as needed (dry eyes).   Yes [provider]  clopidogrel (PLAVIX) 75 MG tablet Take 1 tablet (75 mg total) by mouth daily with breakfast. 09/23/20  Yes Wieting, Richard, MD  dextromethorphan-guaiFENesin [facility] DM) 30-600 MG 12hr tablet Take 1 tablet by mouth 2 (two) times daily. 02/09/21  Yes Lorella Nimrod, MD  diclofenac Sodium (VOLTAREN) 1 % GEL Apply 2 g topically in the morning. (Apply to right stump)   Yes  [provider]  finasteride (PROSCAR) 5 MG tablet Take 5 mg by mouth daily. 03/13/19  Yes [provider]  HYDROcodone-acetaminophen (NORCO/VICODIN) 5-325 MG tablet Take 1 tablet by mouth every 6 (six) hours as needed for severe pain. 09/23/20  Yes Wieting, Richard, MD  lactulose, encephalopathy, (CHRONULAC) 10 GM/15ML SOLN Take 10 g by mouth 2 (two) times daily as needed (mild constipation).   Yes [provider]  loperamide (IMODIUM A-D) 2 MG tablet Take 2 tablets (4 mg total) by mouth 4 (four) times daily as needed for diarrhea or loose stools. 09/09/19  Yes Carrie Mew, MD  loratadine (CLARITIN) 10 MG tablet Take 10 mg by mouth See admin instructions. Take 10 mg by mouth daily on Monday, Wednesday, Friday and Sunday   Yes [provider]  metoprolol tartrate (LOPRESSOR) 25 MG tablet Take 0.5 tablets (12.5 mg total) by mouth 2 (two) times daily. 02/09/21  Yes Lorella Nimrod, MD  midodrine (PROAMATINE) 10 MG tablet Take 1 tablet (10 mg total) by mouth 3 (three) times daily with meals. 02/09/21  Yes Lorella Nimrod, MD  pregabalin (LYRICA) 100 MG capsule Take 1 capsule (100 mg total) by mouth 2 (two) times daily. 02/09/21  Yes Lorella Nimrod, MD  rOPINIRole (REQUIP) 1 MG tablet Take 1 mg by mouth at bedtime. 03/13/19  Yes [provider]  sevelamer carbonate (RENVELA) 800 MG tablet Take 1,600 mg by mouth See admin instructions. Take 2 tablets (1600mg ) by mouth three times daily with meals on Monday, Wednesday, Friday and Sunday   Yes [provider]  sevelamer carbonate (RENVELA) 800 MG tablet Take 800 mg by mouth See admin instructions. Take 2 tablets (1600mg ) by mouth twice daily with meals (breakfast and dinner) on Tuesday, Thursday and Saturday   Yes [provider]  sodium hypochlorite (DAKIN'S 1/4 STRENGTH) 0.125 % SOLN Apply 1 application topically See admin instructions. Use as directed after cleansing left stump wound - use twice daily and  as needed 01/15/21  Yes [provider]  vitamin B-12 (CYANOCOBALAMIN) 1000 MCG tablet Take 1,000 mcg by mouth daily.   Yes [provider]    Allergies Simvastatin  History reviewed. No pertinent family history.  Social History Social History   Tobacco Use   Smoking status: Never   Smokeless tobacco: Never  Vaping Use   Vaping Use: Never used  Substance Use Topics   Alcohol use: Never   Drug use: Never    Review of Systems Unable to obtain ____________________________________________   PHYSICAL EXAM:  VITAL SIGNS: ED Triage Vitals  Enc Vitals Group     BP 02/20/21 1446 134/90     Pulse Rate 02/20/21 1446 (!) 115  Resp 02/20/21 1446 20     Temp 02/20/21 1448 98.1 F (36.7 C)     Temp src --      SpO2 02/20/21 1446 92 %     Weight --      Height --      Head Circumference --      Peak Flow --      Pain Score --      Pain Loc --      Pain Edu? --      Excl. in [place]? --     Constitutional: Patient is awake but very groggy Eyes: Conjunctivae are normal. PER.  Head: Atraumatic. Nose: No congestion/rhinnorhea. Mouth/Throat: Mucous membranes are moist.  Oropharynx non-erythematous. Ears: TMs are pearly gray.  Not bulging not red.  There is no apparent mastoid tenderness. Neck: No stridor.   Cardiovascular: Rapid rate, regular rhythm. Grossly normal heart sounds.  Good peripheral circulation. Respiratory: Normal respiratory effort.  No retractions. Lungs CTAB. Gastrointestinal: Soft and nontender. No distention. No abdominal bruits.  Musculoskeletal: Right stump in good shape not tender left stump some erythema on the distal end of it with a small ulcer about quarter sized ulcer looks clean.   Neurologic: Patient can answer some questions but then starts rambling Skin:  Skin is warm, dry and intact. No rash noted.   ____________________________________________   LABS (all labs ordered are listed, but only abnormal results are  displayed)  Labs Reviewed  COMPREHENSIVE METABOLIC PANEL - Abnormal; Notable for the following components:      Result Value   Potassium 3.0 (*)    Glucose, Bld 124 (*)    BUN 24 (*)    Creatinine, Ser 3.94 (*)    Albumin 3.1 (*)    Alkaline Phosphatase 150 (*)    Total Bilirubin 2.2 (*)    GFR, Estimated 15 (*)    All other components within normal limits  CBC WITH DIFFERENTIAL/PLATELET - Abnormal; Notable for the following components:   RBC 3.76 (*)    Hemoglobin 11.3 (*)    HCT 36.7 (*)    RDW 20.5 (*)    Platelets 68 (*)    nRBC 0.4 (*)    All other components within normal limits  BRAIN NATRIURETIC PEPTIDE - Abnormal; Notable for the following components:   B Natriuretic Peptide >4,500.0 (*)    All other components within normal limits  BLOOD GAS, ARTERIAL - Abnormal; Notable for the following components:   Acid-Base Excess 2.9 (*)    All other components within normal limits  TROPONIN I (HIGH SENSITIVITY) - Abnormal; Notable for the following components:   Troponin I (High Sensitivity) 1,557 (*)    All other components within normal limits  TROPONIN I (HIGH SENSITIVITY) - Abnormal; Notable for the following components:   Troponin I (High Sensitivity) 2,463 (*)    All other components within normal limits  RESP PANEL BY RT-PCR (FLU A&B, COVID) ARPGX2  CULTURE, BLOOD (SINGLE)  URINE CULTURE  URINALYSIS, ROUTINE W REFLEX MICROSCOPIC  PROCALCITONIN  PROCALCITONIN  LACTIC ACID, PLASMA  LACTIC ACID, PLASMA  APTT  PROTIME-INR  HEPARIN LEVEL (UNFRACTIONATED)  STREP PNEUMONIAE URINARY ANTIGEN  LEGIONELLA PNEUMOPHILA SEROGP 1 UR AG  CBC  BASIC METABOLIC PANEL  MAGNESIUM  PHOSPHORUS   ____________________________________________  EKG  EKG interpreted by me shows A. fib with RVR at rate of 143 rightward axis no acute ST-T changes seen very irregular baseline. ___ EKG #2 read interpreted by me shows A. fib at a  rate of 81 normal axis no acute ST-T wave changes.  No  ST elevation or depression.  _________________________________________  RADIOLOGY Gertha Calkin, personally viewed and evaluated these images (plain radiographs) as part of my medical decision making, as well as reviewing the written report by the radiologist.  ED MD interpretation: Chest x-ray read by radiology reviewed by me shows cardiomegaly with some increased markings which could be alveolar edema atelectasis versus infection.  There is also possibility of a posterior effusion. CT of the head read by radiologist reviewed by me shows bilateral mastoid fluid and some thin gray behind the left TM.  Left TM does not look bulging or red when I see it. Official radiology report(s): DG Chest 2 View  Result Date: 02/20/2021 CLINICAL DATA:  Weakness EXAM: CHEST - 2 VIEW COMPARISON:  Radiograph 02/05/2021 FINDINGS: Unchanged enlarged cardiac silhouette. There are bibasilar airspace opacities. Mild diffuse interstitial opacities. Trace pleural effusions may be present. No visible pneumothorax. No acute osseous abnormality. Thoracic spondylosis. IMPRESSION: Cardiomegaly with interstitial pulmonary edema and bibasilar opacities which could be alveolar edema, atelectasis, or infection. Electronically Signed   By: Maurine Simmering M.D.   On: 02/20/2021 15:28   CT Head Wo Contrast  Result Date: 02/20/2021 CLINICAL DATA:  Altered mental status EXAM: CT HEAD WITHOUT CONTRAST TECHNIQUE: Contiguous axial images were obtained from the base of the skull through the vertex without intravenous contrast. COMPARISON:  12/03/2009 FINDINGS: Brain: Normal anatomic configuration. Parenchymal volume loss is commensurate with the patient's age, though progressive since prior examination. Mild periventricular white matter changes are present likely reflecting the sequela of small vessel ischemia. Physiologic calcification within the globus pallidi again noted. No abnormal intra or extra-axial mass lesion or fluid collection.  No abnormal mass effect or midline shift. No evidence of acute intracranial hemorrhage or infarct. Ventricular size is normal. Cerebellum unremarkable. Vascular: No asymmetric hyperdense vasculature at the skull base. Advanced calcifications are noted within the carotid siphons and distal vertebral arteries bilaterally. Skull: Intact Sinuses/Orbits: Mild mucosal thickening within the frontal sinuses. Remaining paranasal sinuses are clear. Ocular lenses have been removed. Orbits are otherwise unremarkable. Other: There is fluid opacification of the mastoid air cells bilaterally without osseous erosion. Fluid debris is also noted within the left middle ear cavity. IMPRESSION: No acute intracranial abnormality. Progressive senescent change. Mild bifrontal paranasal sinus disease. Bilateral mastoid effusions. Fluid within the left middle ear cavity. Clinical correlation for signs and symptoms of otomastoiditis may be helpful. Electronically Signed   By: Fidela Salisbury M.D.   On: 02/20/2021 21:59   CT CHEST ABDOMEN PELVIS WO CONTRAST  Result Date: 02/20/2021 CLINICAL DATA:  Suspected complicated pneumonia, abdominal discomfort also. End-stage renal failure patient on dialysis. EXAM: CT CHEST, ABDOMEN AND PELVIS WITHOUT CONTRAST TECHNIQUE: Multidetector CT imaging of the chest, abdomen and pelvis was performed following the standard protocol without IV contrast. COMPARISON:  Chest, abdomen and pelvis CT no contrast 01/31/2021, CT angiography chest 09/20/2020. FINDINGS: CT CHEST FINDINGS Cardiovascular: Moderate panchamber cardiomegaly with small stable pericardial effusion. Three-vessel heavy calcific CAD. Increasingly prominent pulmonary trunk measuring 3.8 cm indicating arterial hypertension. Patchy aortic atherosclerosis with tortuosity also noted without aneurysm. Mildly distended superior pulmonary veins. Mediastinum/Nodes: Old right hemithyroidectomy with unremarkable left lobe. Stable small subcentimeter  scattered mediastinal nodes. No bulky or encasing adenopathy. Axillary spaces are clear. Subareolar gynecomastia. Lungs/Pleura: Small to moderate-sized increased layering pleural effusions. Slight increased subpleural septal lines in the bases consistent with interstitial edema. There is consolidation or atelectasis  in the posterior lower lobes alongside the effusions, more simple compressive atelectasis posteriorly in the upper lobes. No filling defect in the trachea and central airways. Musculoskeletal: Mild but increased body wall anasarca. 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 Osteopenia and slight thoracic dextroscoliosis. No worrisome thoracic bone lesion. CT ABDOMEN PELVIS FINDINGS Hepatobiliary: No liver mass is seen without contrast. There are stones in the gallbladder, and mild gallbladder thickening or trace pericholecystic fluid but this was seen on both of the prior studies it could simply be congestive. The common bile duct has been stented since the prior study with the distal stent just inside the descending duodenum and interval common bile duct decompression. Pancreas: There is slight peripancreatic stranding, but significantly improved compared to the prior study. 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 Spleen: Stable mild enlargement measuring 15.4 cm AP, with vascular calcifications in the splenic substance. Adrenals/Urinary Tract: No adrenal mass. Atrophic kidneys with bilateral cysts. 1.3 cm probable hyperdense cyst in the superior pole left kidney of 52 Hounsfield units. Consider follow-up ultrasound or MRI to assess for internal complexity. Other cysts are low in density. There are heavy renal vascular calcifications. No hydronephrosis or stone. The bladder mostly obscured by bilateral hip replacements. The dome is mildly thickened as before. Stomach/Bowel: Increased thickened folds in  the stomach. Probable gastritis. No bowel wall thickening or dilatation including the appendix with sigmoid diverticulosis again seen. There is a surgically widened small bowel segment in the anterior mid abdomen which is unchanged. The rectum is obscured by the hip hardware. Vascular/Lymphatic: Extensive aortoiliac and visceral arterial calcific plaques. No AAA. Multiple subcentimeter up to borderline size retroperitoneal nodes are redemonstrated unchanged. Assessment for pelvic adenopathy very limited due to the hip hardware but no obvious mass. Reproductive: Obscured prostate, unable to evaluate due to the hip hardware. Other: Minimal ascites again noted in the abdomen and pelvis, but increased compared to the prior study with increased body wall anasarca. Small umbilical and inguinal fat hernias. Musculoskeletal: Bilateral hip replacements. Osteopenia and advanced degenerative change of the spine with multilevel bridging osteophytes. Ankylosis right SI joint. 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 IMPRESSION: 1. Mild features of CHF, with increased small to moderate bilateral layering pleural effusions and with consolidation or atelectasis in the adjacent lower lobes. No other focal pulmonary abnormality. 2. Increased body wall anasarca and small-volume abdominal and pelvic ascites. 3. Cardiomegaly with aortic and coronary artery atherosclerosis, extensive vascular calcifications in the abdomen including visceral arteries. 4. Increased prominence of the pulmonary trunk.  No aortic aneurysm. 5. Significantly improved findings of pancreatitis with only minimal peripancreatic stranding today. 6. Since 01/31/2021, interval CBD stenting and resolution of choledocholithiasis and biliary dilatation. 7. Cholelithiasis with mild gallbladder thickening and pericholecystic fluid which was seen on both prior studies and could be congestive. Correlate clinically for  cholecystitis. 8. Renal atrophy and cysts with 1.3 cm probable hyperdense cyst left superior pole. MRI or ultrasound recommended to assess for internal complexity. 9. Possible cystitis. 10. Hip hardware obscuring visualization of the lower bladder, rectum and pelvic sidewalls. 11. Probable gastritis and additional findings discussed above. No intestinal dilatation or wall thickening. Diverticulosis. Electronically Signed   By: Telford Nab M.D.   On: 02/20/2021 22:20    ____________________________________________   PROCEDURES  Procedure(s) performed (including Critical Care): Critical care time 45 minutes.  This includes talking to the patient and trying to evaluate  him several times.  Additionally I reviewed many of his old records and his current studies.  I have talked to the ICU.  I I have spoken with Dr. Zollie Scale, renal doctor.  Procedures   ____________________________________________   INITIAL IMPRESSION / ASSESSMENT AND PLAN / ED COURSE       Insert timestamp patient is very sick.  We will get another EKG as his troponin continues to go up.  He will need dialysis.  Patient has had antibiotics.  We will get him admitted and try and determine the cause of his rapid decompensation.         ____________________________________________   FINAL CLINICAL IMPRESSION(S) / ED DIAGNOSES  Final diagnoses:  Altered mental status, unspecified altered mental status type  NSTEMI, initial episode of care [facility])  Acute congestive heart failure, unspecified heart failure type ([place])  Fever, unspecified fever cause     ED Discharge Orders     None        Note:  This document was prepared using Dragon voice recognition software and may include unintentional dictation errors.    Nena Polio, MD 02/21/21 0029 ----------------------------------------- 12:29 AM on 02/21/2021 ----------------------------------------- Patient who was initially oozing from his dialysis site and  had stopped using his restarted and oozing also from his IV sites.  Discussed this with ICU.  We will hold the heparin until at least we get see what the coags look like.  May need to hold it for longer.  He has been taking Eliquis as well.   Nena Polio, MD 02/21/21 Lupita Shutter

## 2021-02-20 NOTE — ED Triage Notes (Signed)
Pt via EMS from Dialysis. Pt is confused and lethargic. Denies pain. Pt is alert to self only. Unknown of pt's baseline.

## 2021-02-20 NOTE — ED Triage Notes (Signed)
Pt in via EMS from [facility] dialysis. 154/96. EMS reports dialysis was stopped due to pt being lethargic. Pt takes eloquis.

## 2021-02-20 NOTE — ED Provider Notes (Signed)
°  Emergency Medicine Provider Triage Evaluation Note  Antonio Phillips , a 75 y.o.male,  was evaluated in triage.  Pt complains of altered mental status.  Patient is not able to communicate coherently with [place] at this time.   Review of Systems  Positive: AMS Negative: Denies fever, chest pain, vomiting  Physical Exam   Vitals:   02/20/21 1446 02/20/21 1448  BP: 134/90   Pulse: (!) 115   Resp: 20   Temp:  98.1 F (36.7 C)  SpO2: 92%    Gen:   Awake, no distress   Resp:  Normal effort  MSK:   Moves extremities without difficulty  Other:    Medical Decision Making  Given the patient's initial medical screening exam, the following diagnostic evaluation has been ordered. The patient will be placed in the appropriate treatment space, once one is available, to complete the evaluation and treatment. I have discussed the plan of care with the patient and I have advised the patient that an ED physician or mid-level practitioner will reevaluate their condition after the test results have been received, as the results may give them additional insight into the type of treatment they may need.    Diagnostics: Labs, EKG, CXR, UA  Treatments: none immediately   Teodoro Spray, [place] 02/20/21 1450    Vanessa Savage, MD 02/20/21 1620

## 2021-02-20 NOTE — Progress Notes (Signed)
CODE SEPSIS - PHARMACY COMMUNICATION  **Broad Spectrum Antibiotics should be administered within 1 hour of Sepsis diagnosis**  Time Code Sepsis Called/Page Received: 2100  Antibiotics Ordered: vancomycin, cefepime, metronidazole  Time of 1st antibiotic administration: 2129    Sherilyn Banker ,PharmD Clinical Pharmacist  02/20/2021  9:01 PM

## 2021-02-20 NOTE — Progress Notes (Signed)
Pt being followed by [facility] for Sepsis protocol. 

## 2021-02-20 NOTE — ED Notes (Signed)
Waiting on cefepime from pharmacy

## 2021-02-21 DIAGNOSIS — I4821 Permanent atrial fibrillation: Secondary | ICD-10-CM | POA: Diagnosis present

## 2021-02-21 DIAGNOSIS — D65 Disseminated intravascular coagulation [defibrination syndrome]: Secondary | ICD-10-CM | POA: Diagnosis present

## 2021-02-21 DIAGNOSIS — T8451XA Infection and inflammatory reaction due to internal right hip prosthesis, initial encounter: Secondary | ICD-10-CM | POA: Diagnosis present

## 2021-02-21 DIAGNOSIS — T8452XA Infection and inflammatory reaction due to internal left hip prosthesis, initial encounter: Secondary | ICD-10-CM | POA: Diagnosis not present

## 2021-02-21 DIAGNOSIS — T8484XD Pain due to internal orthopedic prosthetic devices, implants and grafts, subsequent encounter: Secondary | ICD-10-CM | POA: Diagnosis not present

## 2021-02-21 DIAGNOSIS — R102 Pelvic and perineal pain: Secondary | ICD-10-CM | POA: Diagnosis not present

## 2021-02-21 DIAGNOSIS — R404 Transient alteration of awareness: Secondary | ICD-10-CM | POA: Diagnosis not present

## 2021-02-21 DIAGNOSIS — E876 Hypokalemia: Secondary | ICD-10-CM | POA: Diagnosis not present

## 2021-02-21 DIAGNOSIS — K851 Biliary acute pancreatitis without necrosis or infection: Secondary | ICD-10-CM | POA: Diagnosis not present

## 2021-02-21 DIAGNOSIS — A419 Sepsis, unspecified organism: Secondary | ICD-10-CM | POA: Diagnosis present

## 2021-02-21 DIAGNOSIS — Z96649 Presence of unspecified artificial hip joint: Secondary | ICD-10-CM | POA: Diagnosis not present

## 2021-02-21 DIAGNOSIS — N179 Acute kidney failure, unspecified: Secondary | ICD-10-CM | POA: Diagnosis present

## 2021-02-21 DIAGNOSIS — R6520 Severe sepsis without septic shock: Secondary | ICD-10-CM | POA: Diagnosis present

## 2021-02-21 DIAGNOSIS — I4891 Unspecified atrial fibrillation: Secondary | ICD-10-CM | POA: Diagnosis not present

## 2021-02-21 DIAGNOSIS — M25551 Pain in right hip: Secondary | ICD-10-CM | POA: Diagnosis not present

## 2021-02-21 DIAGNOSIS — R509 Fever, unspecified: Secondary | ICD-10-CM | POA: Diagnosis not present

## 2021-02-21 DIAGNOSIS — K921 Melena: Secondary | ICD-10-CM | POA: Diagnosis not present

## 2021-02-21 DIAGNOSIS — W19XXXA Unspecified fall, initial encounter: Secondary | ICD-10-CM | POA: Diagnosis not present

## 2021-02-21 DIAGNOSIS — I5043 Acute on chronic combined systolic (congestive) and diastolic (congestive) heart failure: Secondary | ICD-10-CM | POA: Diagnosis present

## 2021-02-21 DIAGNOSIS — I509 Heart failure, unspecified: Secondary | ICD-10-CM | POA: Diagnosis not present

## 2021-02-21 DIAGNOSIS — T8143XA Infection following a procedure, organ and space surgical site, initial encounter: Secondary | ICD-10-CM | POA: Diagnosis not present

## 2021-02-21 DIAGNOSIS — L03116 Cellulitis of left lower limb: Secondary | ICD-10-CM | POA: Diagnosis not present

## 2021-02-21 DIAGNOSIS — I214 Non-ST elevation (NSTEMI) myocardial infarction: Secondary | ICD-10-CM | POA: Diagnosis not present

## 2021-02-21 DIAGNOSIS — D631 Anemia in chronic kidney disease: Secondary | ICD-10-CM | POA: Diagnosis not present

## 2021-02-21 DIAGNOSIS — Z7401 Bed confinement status: Secondary | ICD-10-CM | POA: Diagnosis not present

## 2021-02-21 DIAGNOSIS — I959 Hypotension, unspecified: Secondary | ICD-10-CM | POA: Diagnosis not present

## 2021-02-21 DIAGNOSIS — R778 Other specified abnormalities of plasma proteins: Secondary | ICD-10-CM | POA: Diagnosis not present

## 2021-02-21 DIAGNOSIS — M7989 Other specified soft tissue disorders: Secondary | ICD-10-CM | POA: Diagnosis not present

## 2021-02-21 DIAGNOSIS — J189 Pneumonia, unspecified organism: Secondary | ICD-10-CM | POA: Diagnosis present

## 2021-02-21 DIAGNOSIS — K6389 Other specified diseases of intestine: Secondary | ICD-10-CM | POA: Diagnosis not present

## 2021-02-21 DIAGNOSIS — G928 Other toxic encephalopathy: Secondary | ICD-10-CM | POA: Diagnosis present

## 2021-02-21 DIAGNOSIS — I4811 Longstanding persistent atrial fibrillation: Secondary | ICD-10-CM | POA: Diagnosis not present

## 2021-02-21 DIAGNOSIS — T8450XA Infection and inflammatory reaction due to unspecified internal joint prosthesis, initial encounter: Secondary | ICD-10-CM | POA: Diagnosis not present

## 2021-02-21 DIAGNOSIS — R601 Generalized edema: Secondary | ICD-10-CM | POA: Diagnosis not present

## 2021-02-21 DIAGNOSIS — Z96642 Presence of left artificial hip joint: Secondary | ICD-10-CM | POA: Diagnosis not present

## 2021-02-21 DIAGNOSIS — G9341 Metabolic encephalopathy: Secondary | ICD-10-CM

## 2021-02-21 DIAGNOSIS — Z789 Other specified health status: Secondary | ICD-10-CM | POA: Diagnosis not present

## 2021-02-21 DIAGNOSIS — L0390 Cellulitis, unspecified: Secondary | ICD-10-CM | POA: Diagnosis not present

## 2021-02-21 DIAGNOSIS — Y831 Surgical operation with implant of artificial internal device as the cause of abnormal reaction of the patient, or of later complication, without mention of misadventure at the time of the procedure: Secondary | ICD-10-CM | POA: Diagnosis present

## 2021-02-21 DIAGNOSIS — I2510 Atherosclerotic heart disease of native coronary artery without angina pectoris: Secondary | ICD-10-CM | POA: Diagnosis not present

## 2021-02-21 DIAGNOSIS — Z20822 Contact with and (suspected) exposure to covid-19: Secondary | ICD-10-CM | POA: Diagnosis present

## 2021-02-21 DIAGNOSIS — N186 End stage renal disease: Secondary | ICD-10-CM | POA: Diagnosis present

## 2021-02-21 DIAGNOSIS — I25110 Atherosclerotic heart disease of native coronary artery with unstable angina pectoris: Secondary | ICD-10-CM | POA: Diagnosis present

## 2021-02-21 DIAGNOSIS — J9610 Chronic respiratory failure, unspecified whether with hypoxia or hypercapnia: Secondary | ICD-10-CM | POA: Diagnosis present

## 2021-02-21 DIAGNOSIS — D62 Acute posthemorrhagic anemia: Secondary | ICD-10-CM | POA: Diagnosis not present

## 2021-02-21 DIAGNOSIS — R4182 Altered mental status, unspecified: Secondary | ICD-10-CM | POA: Diagnosis not present

## 2021-02-21 DIAGNOSIS — Z1621 Resistance to vancomycin: Secondary | ICD-10-CM | POA: Diagnosis present

## 2021-02-21 DIAGNOSIS — Z66 Do not resuscitate: Secondary | ICD-10-CM | POA: Diagnosis present

## 2021-02-21 DIAGNOSIS — L0291 Cutaneous abscess, unspecified: Secondary | ICD-10-CM | POA: Diagnosis not present

## 2021-02-21 DIAGNOSIS — I21A1 Myocardial infarction type 2: Secondary | ICD-10-CM | POA: Diagnosis present

## 2021-02-21 DIAGNOSIS — E669 Obesity, unspecified: Secondary | ICD-10-CM | POA: Diagnosis present

## 2021-02-21 DIAGNOSIS — D6869 Other thrombophilia: Secondary | ICD-10-CM | POA: Diagnosis not present

## 2021-02-21 DIAGNOSIS — Z992 Dependence on renal dialysis: Secondary | ICD-10-CM | POA: Diagnosis not present

## 2021-02-21 DIAGNOSIS — I5022 Chronic systolic (congestive) heart failure: Secondary | ICD-10-CM | POA: Diagnosis not present

## 2021-02-21 DIAGNOSIS — E1122 Type 2 diabetes mellitus with diabetic chronic kidney disease: Secondary | ICD-10-CM | POA: Diagnosis present

## 2021-02-21 DIAGNOSIS — Z96641 Presence of right artificial hip joint: Secondary | ICD-10-CM | POA: Diagnosis not present

## 2021-02-21 DIAGNOSIS — I248 Other forms of acute ischemic heart disease: Secondary | ICD-10-CM | POA: Diagnosis not present

## 2021-02-21 DIAGNOSIS — M79605 Pain in left leg: Secondary | ICD-10-CM | POA: Diagnosis not present

## 2021-02-21 DIAGNOSIS — Z515 Encounter for palliative care: Secondary | ICD-10-CM | POA: Diagnosis not present

## 2021-02-21 DIAGNOSIS — K5730 Diverticulosis of large intestine without perforation or abscess without bleeding: Secondary | ICD-10-CM | POA: Diagnosis not present

## 2021-02-21 DIAGNOSIS — I739 Peripheral vascular disease, unspecified: Secondary | ICD-10-CM | POA: Diagnosis not present

## 2021-02-21 DIAGNOSIS — J811 Chronic pulmonary edema: Secondary | ICD-10-CM | POA: Diagnosis not present

## 2021-02-21 DIAGNOSIS — N2581 Secondary hyperparathyroidism of renal origin: Secondary | ICD-10-CM | POA: Diagnosis not present

## 2021-02-21 DIAGNOSIS — L02415 Cutaneous abscess of right lower limb: Secondary | ICD-10-CM | POA: Diagnosis present

## 2021-02-21 LAB — BLOOD GAS, ARTERIAL
Acid-Base Excess: 2.9 mmol/L — ABNORMAL HIGH (ref 0.0–2.0)
Bicarbonate: 27.1 mmol/L (ref 20.0–28.0)
O2 Saturation: 97.8 %
Patient temperature: 37
pCO2 arterial: 39 mmHg (ref 32.0–48.0)
pH, Arterial: 7.45 (ref 7.350–7.450)
pO2, Arterial: 96 mmHg (ref 83.0–108.0)

## 2021-02-21 LAB — PROTIME-INR
INR: 2.4 — ABNORMAL HIGH (ref 0.8–1.2)
Prothrombin Time: 26.2 seconds — ABNORMAL HIGH (ref 11.4–15.2)

## 2021-02-21 LAB — BASIC METABOLIC PANEL
Anion gap: 12 (ref 5–15)
BUN: 27 mg/dL — ABNORMAL HIGH (ref 8–23)
CO2: 25 mmol/L (ref 22–32)
Calcium: 8.7 mg/dL — ABNORMAL LOW (ref 8.9–10.3)
Chloride: 104 mmol/L (ref 98–111)
Creatinine, Ser: 4.56 mg/dL — ABNORMAL HIGH (ref 0.61–1.24)
GFR, Estimated: 13 mL/min — ABNORMAL LOW (ref >60)
Glucose, Bld: 93 mg/dL (ref 70–99)
Potassium: 3.2 mmol/L — ABNORMAL LOW (ref 3.5–5.1)
Sodium: 141 mmol/L (ref 135–145)

## 2021-02-21 LAB — CBC
HCT: 34.1 % — ABNORMAL LOW (ref 39.0–52.0)
Hemoglobin: 10.3 g/dL — ABNORMAL LOW (ref 13.0–17.0)
MCH: 30.3 pg (ref 26.0–34.0)
MCHC: 30.2 g/dL (ref 30.0–36.0)
MCV: 100.3 fL — ABNORMAL HIGH (ref 80.0–100.0)
Platelets: 61 10*3/uL — ABNORMAL LOW (ref 150–400)
RBC: 3.4 MIL/uL — ABNORMAL LOW (ref 4.22–5.81)
RDW: 20.6 % — ABNORMAL HIGH (ref 11.5–15.5)
WBC: 5.1 10*3/uL (ref 4.0–10.5)
nRBC: 0.4 % — ABNORMAL HIGH (ref 0.0–0.2)

## 2021-02-21 LAB — PHOSPHORUS: Phosphorus: 3.8 mg/dL (ref 2.5–4.6)

## 2021-02-21 LAB — APTT: aPTT: 44 seconds — ABNORMAL HIGH (ref 24–36)

## 2021-02-21 LAB — LACTIC ACID, PLASMA
Lactic Acid, Venous: 1.9 mmol/L (ref 0.5–1.9)
Lactic Acid, Venous: 2.4 mmol/L (ref 0.5–1.9)

## 2021-02-21 LAB — TYPE AND SCREEN
ABO/RH(D): O POS
Antibody Screen: NEGATIVE

## 2021-02-21 LAB — PROCALCITONIN
Procalcitonin: 2.17 ng/mL
Procalcitonin: 2.65 ng/mL

## 2021-02-21 LAB — AMMONIA: Ammonia: 22 umol/L (ref 9–35)

## 2021-02-21 LAB — MRSA NEXT GEN BY PCR, NASAL: MRSA by PCR Next Gen: NOT DETECTED

## 2021-02-21 LAB — MAGNESIUM: Magnesium: 1.9 mg/dL (ref 1.7–2.4)

## 2021-02-21 LAB — HEPARIN LEVEL (UNFRACTIONATED): Heparin Unfractionated: >1.1 IU/mL — ABNORMAL HIGH (ref 0.30–0.70)

## 2021-02-21 LAB — GLUCOSE, CAPILLARY: Glucose-Capillary: 105 mg/dL — ABNORMAL HIGH (ref 70–99)

## 2021-02-21 MED ORDER — VANCOMYCIN HCL IN DEXTROSE 1-5 GM/200ML-% IV SOLN: 1000.0000 mg | INTRAVENOUS | Status: DC

## 2021-02-21 MED ORDER — CHLORHEXIDINE GLUCONATE CLOTH 2 % EX PADS
6.0000 | MEDICATED_PAD | Freq: Every day | CUTANEOUS | Status: DC
  Administered 2021-02-21 – 2021-02-23 (×2): 6 via TOPICAL

## 2021-02-21 MED ORDER — POLYETHYLENE GLYCOL 3350 17 G PO PACK: 17.0000 g | PACK | Freq: Every day | ORAL | Status: DC | PRN

## 2021-02-21 MED ORDER — LIDOCAINE 5 % EX PTCH
1.0000 | MEDICATED_PATCH | CUTANEOUS | Status: DC
  Administered 2021-02-21 – 2021-03-02 (×10): 1 via TRANSDERMAL
  Filled 2021-02-21 (×11): qty 1

## 2021-02-21 MED ORDER — HEPARIN BOLUS VIA INFUSION
4000.0000 [IU] | Freq: Once | INTRAVENOUS | Status: DC
  Filled 2021-02-21: qty 4000

## 2021-02-21 MED ORDER — HEPARIN (PORCINE) 25000 UT/250ML-% IV SOLN: 1300.0000 [IU]/h | INTRAVENOUS | Status: DC

## 2021-02-21 MED ORDER — HYDROCODONE-ACETAMINOPHEN 5-325 MG PO TABS
1.0000 | ORAL_TABLET | Freq: Four times a day (QID) | ORAL | Status: DC | PRN
  Administered 2021-02-21 – 2021-03-03 (×22): 1 via ORAL
  Filled 2021-02-21 (×22): qty 1

## 2021-02-21 MED ORDER — HYDROMORPHONE HCL 1 MG/ML IJ SOLN
1.0000 mg | INTRAMUSCULAR | Status: DC | PRN
  Administered 2021-02-22 – 2021-02-26 (×3): 1 mg via INTRAVENOUS
  Filled 2021-02-21 (×3): qty 1

## 2021-02-21 MED ORDER — DOCUSATE SODIUM 100 MG PO CAPS
100.0000 mg | ORAL_CAPSULE | Freq: Two times a day (BID) | ORAL | Status: DC | PRN
  Administered 2021-02-23: 22:00:00 100 mg via ORAL
  Filled 2021-02-21: qty 1

## 2021-02-21 MED ORDER — SODIUM CHLORIDE 0.9 % IV SOLN
1.0000 g | INTRAVENOUS | Status: DC
  Administered 2021-02-22: 21:00:00 1 g via INTRAVENOUS
  Filled 2021-02-21 (×2): qty 1

## 2021-02-21 NOTE — Consult Note (Addendum)
Cardiology Consultation:   Patient ID: Antonio Phillips MRN: [id number]; DOB: 04-26-1945  Admit date: 02/20/2021 Date of Consult: 02/21/2021  PCP:  Marsh Dolly, MD   [facility] Providers Cardiologist:  Ida Rogue, MD       Patient Profile:   Antonio Phillips is a 75 y.o. male with a hx of permanent atrial fib, severe peripheral vascular disease s/p multiple amputations who is being seen 02/21/2021 for the evaluation of atrial fib, dyspnea and elevated troponin and bnp at the request of Dr. Dr. Lanney Gins.  History of Present Illness:   [name] has a h/o the above problems and was admitted with a diagnosis on encephalopathy and abdominal pain. The patient has been treated with HD yesterday and his dyspnea is improved. His chest CT suggested pneumonia. He is on anti-biotics. He does not have any acute STT changes on his ECG and denies anginal symptoms. He has had low grade fever. He has known mild biventricular LV dysfunction by echo. He had left heart cath in July where he underwent PCI of an OM2 lesion. His initial troponin was 1557 and repeat 2463.    Past Medical History:  Diagnosis Date   Demand ischemia ([place])    a. 11/2017 elev Trop in setting of AFib/SVT-->Echo [facility]): EF>55%. Mild LVH. Nl RV fxn.   Diabetes ([place])    a. Pt states that he has no hx of diabetes--A1c 5.5 11/2018.   ESRD (end stage renal disease) ([place])    a. On HD since 2019.   Hyperlipidemia    Hypotension    a. On midodrine.   Morbid obesity ([place])    PAD (peripheral artery disease) ([place])    a. s/p L AKA and R BKA.   Permanent atrial fibrillation ([place])    a. Dx 2019. CHA2DS2VASc = 3-4-->Eliquis.    Past Surgical History:  Procedure Laterality Date   AV FISTULA REPAIR     BELOW KNEE LEG AMPUTATION Bilateral    CHOLECYSTECTOMY     ERCP N/A 02/04/2021   Procedure: ENDOSCOPIC RETROGRADE CHOLANGIOPANCREATOGRAPHY (ERCP);  Surgeon: Lucilla Lame, MD;  Location: [facility] ENDOSCOPY;  Service:  Endoscopy;  Laterality: N/A;   LEFT HEART CATH AND CORONARY ANGIOGRAPHY N/A 09/22/2020   Procedure: LEFT HEART CATH AND CORONARY ANGIOGRAPHY;  Surgeon: Sherren Mocha, MD;  Location: [place] [facility];  Service: Cardiovascular;  Laterality: N/A;   TOTAL HIP ARTHROPLASTY Bilateral      Home Medications:  Prior to Admission medications   Medication Sig Start Date End Date Taking? Authorizing Provider  acetaminophen (TYLENOL) 325 MG tablet Take 2 tablets (650 mg total) by mouth every 6 (six) hours as needed for mild pain or moderate pain. 01/17/20  Yes Wieting, Richard, MD  apixaban (ELIQUIS) 5 MG TABS tablet Take 1 tablet (5 mg total) by mouth 2 (two) times daily. 01/17/20  Yes Wieting, Richard, MD  atorvastatin (LIPITOR) 40 MG tablet Take 40 mg by mouth at bedtime. 09/17/20  Yes [provider]  Carboxymethylcellulose Sod PF 0.5 % SOLN Place 1 drop into both eyes 3 (three) times daily as needed (dry eyes).   Yes [provider]  clopidogrel (PLAVIX) 75 MG tablet Take 1 tablet (75 mg total) by mouth daily with breakfast. 09/23/20  Yes Wieting, Richard, MD  dextromethorphan-guaiFENesin [facility] DM) 30-600 MG 12hr tablet Take 1 tablet by mouth 2 (two) times daily. 02/09/21  Yes Lorella Nimrod, MD  diclofenac Sodium (VOLTAREN) 1 % GEL Apply 2 g topically in the morning. (Apply to  right stump)   Yes [provider]  finasteride (PROSCAR) 5 MG tablet Take 5 mg by mouth daily. 03/13/19  Yes [provider]  HYDROcodone-acetaminophen (NORCO/VICODIN) 5-325 MG tablet Take 1 tablet by mouth every 6 (six) hours as needed for severe pain. 09/23/20  Yes Wieting, Richard, MD  lactulose, encephalopathy, (CHRONULAC) 10 GM/15ML SOLN Take 10 g by mouth 2 (two) times daily as needed (mild constipation).   Yes [provider]  loperamide (IMODIUM A-D) 2 MG tablet Take 2 tablets (4 mg total) by mouth 4 (four) times daily as needed for diarrhea or loose stools. 09/09/19  Yes  Carrie Mew, MD  loratadine (CLARITIN) 10 MG tablet Take 10 mg by mouth See admin instructions. Take 10 mg by mouth daily on Monday, Wednesday, Friday and Sunday   Yes [provider]  metoprolol tartrate (LOPRESSOR) 25 MG tablet Take 0.5 tablets (12.5 mg total) by mouth 2 (two) times daily. 02/09/21  Yes Lorella Nimrod, MD  midodrine (PROAMATINE) 10 MG tablet Take 1 tablet (10 mg total) by mouth 3 (three) times daily with meals. 02/09/21  Yes Lorella Nimrod, MD  pregabalin (LYRICA) 100 MG capsule Take 1 capsule (100 mg total) by mouth 2 (two) times daily. 02/09/21  Yes Lorella Nimrod, MD  rOPINIRole (REQUIP) 1 MG tablet Take 1 mg by mouth at bedtime. 03/13/19  Yes [provider]  sevelamer carbonate (RENVELA) 800 MG tablet Take 1,600 mg by mouth See admin instructions. Take 2 tablets (1600mg ) by mouth three times daily with meals on Monday, Wednesday, Friday and Sunday   Yes [provider]  sevelamer carbonate (RENVELA) 800 MG tablet Take 800 mg by mouth See admin instructions. Take 2 tablets (1600mg ) by mouth twice daily with meals (breakfast and dinner) on Tuesday, Thursday and Saturday   Yes [provider]  sodium hypochlorite (DAKIN'S 1/4 STRENGTH) 0.125 % SOLN Apply 1 application topically See admin instructions. Use as directed after cleansing left stump wound - use twice daily and as needed 01/15/21  Yes [provider]  vitamin B-12 (CYANOCOBALAMIN) 1000 MCG tablet Take 1,000 mcg by mouth daily.   Yes [provider]    Inpatient Medications: Scheduled Meds:  lidocaine  1 patch Transdermal Q24H   Continuous Infusions:  [START ON 02/22/2021] ceFEPime (MAXIPIME) IV     [START ON 02/23/2021] vancomycin     PRN Meds: docusate sodium, HYDROmorphone (DILAUDID) injection, polyethylene glycol  Allergies:    Allergies  Allergen Reactions   Simvastatin Other (See Comments)    Social History:   Social History   Socioeconomic History    Marital status: Single    Spouse name: Not on file   Number of children: Not on file   Years of education: Not on file   Highest education level: Not on file  Occupational History   Not on file  Tobacco Use   Smoking status: Never   Smokeless tobacco: Never  Vaping Use   Vaping Use: Never used  Substance and Sexual Activity   Alcohol use: Never   Drug use: Never   Sexual activity: Not on file  Other Topics Concern   Not on file  Social History Narrative   Lives @ local nsg facility.  Does not routinely exercise.   Social Determinants of Health   Financial Resource Strain: Not on file  Food Insecurity: Not on file  Transportation Needs: Not on file  Physical Activity: Not on file  Stress: Not on file  Social Connections: Not  on file  Intimate Partner Violence: Not on file    Family History:   History reviewed. No pertinent family history.   ROS:  Please see the history of present illness.   All other ROS reviewed and negative.     Physical Exam/Data:   Vitals:   02/21/21 0300 02/21/21 0400 02/21/21 0700 02/21/21 0800  BP: 138/75 116/61 (!) 94/49 106/71  Pulse: 95 90 78 92  Resp: 18 11 12 13   Temp:  98.9 F (37.2 C)    TempSrc:  Oral    SpO2: 100% 100% 99% 98%  Weight:      Height:        Intake/Output Summary (Last 24 hours) at 02/21/2021 1113 Last data filed at 02/21/2021 0400 Gross per 24 hour  Intake 564.5 ml  Output --  Net 564.5 ml   Last 3 Weights 02/21/2021 02/09/2021 02/09/2021  Weight (lbs) 214 lb 8.1 oz 246 lb 7.6 oz 246 lb 14.6 oz  Weight (kg) 97.3 kg 111.8 kg 112 kg     Body mass index is 29.92 kg/m.  General:  Well nourished, well developed, in no acute distress HEENT: normal Neck: 7 cm JVD Vascular: No carotid bruits; Distal pulses 2+ bilaterally Cardiac:  normal S1, S2; IRIRR; no murmur  Lungs:  clear to auscultation bilaterally except for basilar rales, no wheezing, rhonchi  Abd: soft, nontender, no hepatomegaly  Ext: no edema,  s/p bilateral amputations. Musculoskeletal:  No deformities, BUE and BLE strength normal and equal Skin: warm and dry  Neuro:  CNs 2-12 intact, no focal abnormalities noted Psych:  Normal affect   EKG:  The EKG was personally reviewed and demonstrates:  atrial fib with a CVR Telemetry:  Telemetry was personally reviewed and demonstrates:  atrial fib with a CVR  Relevant CV Studies: none  Laboratory Data:  High Sensitivity Troponin:   Recent Labs  Lab 02/02/21 2135 02/05/21 0416 02/05/21 0845 02/20/21 2123 02/20/21 2315  TROPONINIHS 397* 147* 146* 1,557* 2,463*     Chemistry Recent Labs  Lab 02/20/21 1602 02/21/21 0347  NA 138 141  K 3.0* 3.2*  CL 100 104  CO2 26 25  GLUCOSE 124* 93  BUN 24* 27*  CREATININE 3.94* 4.56*  CALCIUM 9.4 8.7*  MG  --  1.9  GFRNONAA 15* 13*  ANIONGAP 12 12    Recent Labs  Lab 02/20/21 1602  PROT 6.8  ALBUMIN 3.1*  AST 18  ALT 9  ALKPHOS 150*  BILITOT 2.2*   Lipids No results for input(s): CHOL, TRIG, HDL, LABVLDL, LDLCALC, CHOLHDL in the last 168 hours.  Hematology Recent Labs  Lab 02/20/21 1602 02/21/21 0347  WBC 5.4 5.1  RBC 3.76* 3.40*  HGB 11.3* 10.3*  HCT 36.7* 34.1*  MCV 97.6 100.3*  MCH 30.1 30.3  MCHC 30.8 30.2  RDW 20.5* 20.6*  PLT 68* 61*   Thyroid No results for input(s): TSH, FREET4 in the last 168 hours.  BNP Recent Labs  Lab 02/20/21 2123  BNP >4,500.0*    DDimer No results for input(s): DDIMER in the last 168 hours.   Radiology/Studies:  DG Chest 2 View  Result Date: 02/20/2021 CLINICAL DATA:  Weakness EXAM: CHEST - 2 VIEW COMPARISON:  Radiograph 02/05/2021 FINDINGS: Unchanged enlarged cardiac silhouette. There are bibasilar airspace opacities. Mild diffuse interstitial opacities. Trace pleural effusions may be present. No visible pneumothorax. No acute osseous abnormality. Thoracic spondylosis. IMPRESSION: Cardiomegaly with interstitial pulmonary edema and bibasilar opacities which could be  alveolar edema,  atelectasis, or infection. Electronically Signed   By: Maurine Simmering M.D.   On: 02/20/2021 15:28   CT Head Wo Contrast  Result Date: 02/20/2021 CLINICAL DATA:  Altered mental status EXAM: CT HEAD WITHOUT CONTRAST TECHNIQUE: Contiguous axial images were obtained from the base of the skull through the vertex without intravenous contrast. COMPARISON:  12/03/2009 FINDINGS: Brain: Normal anatomic configuration. Parenchymal volume loss is commensurate with the patient's age, though progressive since prior examination. Mild periventricular white matter changes are present likely reflecting the sequela of small vessel ischemia. Physiologic calcification within the globus pallidi again noted. No abnormal intra or extra-axial mass lesion or fluid collection. No abnormal mass effect or midline shift. No evidence of acute intracranial hemorrhage or infarct. Ventricular size is normal. Cerebellum unremarkable. Vascular: No asymmetric hyperdense vasculature at the skull base. Advanced calcifications are noted within the carotid siphons and distal vertebral arteries bilaterally. Skull: Intact Sinuses/Orbits: Mild mucosal thickening within the frontal sinuses. Remaining paranasal sinuses are clear. Ocular lenses have been removed. Orbits are otherwise unremarkable. Other: There is fluid opacification of the mastoid air cells bilaterally without osseous erosion. Fluid debris is also noted within the left middle ear cavity. IMPRESSION: No acute intracranial abnormality. Progressive senescent change. Mild bifrontal paranasal sinus disease. Bilateral mastoid effusions. Fluid within the left middle ear cavity. Clinical correlation for signs and symptoms of otomastoiditis may be helpful. Electronically Signed   By: Fidela Salisbury M.D.   On: 02/20/2021 21:59   CT CHEST ABDOMEN PELVIS WO CONTRAST  Result Date: 02/20/2021 CLINICAL DATA:  Suspected complicated pneumonia, abdominal discomfort also. End-stage renal  failure patient on dialysis. EXAM: CT CHEST, ABDOMEN AND PELVIS WITHOUT CONTRAST TECHNIQUE: Multidetector CT imaging of the chest, abdomen and pelvis was performed following the standard protocol without IV contrast. COMPARISON:  Chest, abdomen and pelvis CT no contrast 01/31/2021, CT angiography chest 09/20/2020. FINDINGS: CT CHEST FINDINGS Cardiovascular: Moderate panchamber cardiomegaly with small stable pericardial effusion. Three-vessel heavy calcific CAD. Increasingly prominent pulmonary trunk measuring 3.8 cm indicating arterial hypertension. Patchy aortic atherosclerosis with tortuosity also noted without aneurysm. Mildly distended superior pulmonary veins. Mediastinum/Nodes: Old right hemithyroidectomy with unremarkable left lobe. Stable small subcentimeter scattered mediastinal nodes. No bulky or encasing adenopathy. Axillary spaces are clear. Subareolar gynecomastia. Lungs/Pleura: Small to moderate-sized increased layering pleural effusions. Slight increased subpleural septal lines in the bases consistent with interstitial edema. There is consolidation or atelectasis in the posterior lower lobes alongside the effusions, more simple compressive atelectasis posteriorly in the upper lobes. No filling defect in the trachea and central airways. Musculoskeletal: Mild but increased body wall anasarca. 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 Osteopenia and slight thoracic dextroscoliosis. No worrisome thoracic bone lesion. CT ABDOMEN PELVIS FINDINGS Hepatobiliary: No liver mass is seen without contrast. There are stones in the gallbladder, and mild gallbladder thickening or trace pericholecystic fluid but this was seen on both of the prior studies it could simply be congestive. The common bile duct has been stented since the prior study with the distal stent just inside the descending duodenum and interval common bile duct decompression. Pancreas: There is slight peripancreatic stranding, but  significantly improved compared to the prior study. 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 Spleen: Stable mild enlargement measuring 15.4 cm AP, with vascular calcifications in the splenic substance. Adrenals/Urinary Tract: No adrenal mass. Atrophic kidneys with bilateral cysts. 1.3 cm probable hyperdense cyst in the superior pole left kidney of 52  Hounsfield units. Consider follow-up ultrasound or MRI to assess for internal complexity. Other cysts are low in density. There are heavy renal vascular calcifications. No hydronephrosis or stone. The bladder mostly obscured by bilateral hip replacements. The dome is mildly thickened as before. Stomach/Bowel: Increased thickened folds in the stomach. Probable gastritis. No bowel wall thickening or dilatation including the appendix with sigmoid diverticulosis again seen. There is a surgically widened small bowel segment in the anterior mid abdomen which is unchanged. The rectum is obscured by the hip hardware. Vascular/Lymphatic: Extensive aortoiliac and visceral arterial calcific plaques. No AAA. Multiple subcentimeter up to borderline size retroperitoneal nodes are redemonstrated unchanged. Assessment for pelvic adenopathy very limited due to the hip hardware but no obvious mass. Reproductive: Obscured prostate, unable to evaluate due to the hip hardware. Other: Minimal ascites again noted in the abdomen and pelvis, but increased compared to the prior study with increased body wall anasarca. Small umbilical and inguinal fat hernias. Musculoskeletal: Bilateral hip replacements. Osteopenia and advanced degenerative change of the spine with multilevel bridging osteophytes. Ankylosis right SI joint. 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 IMPRESSION: 1. Mild features of CHF, with increased small to moderate bilateral layering pleural  effusions and with consolidation or atelectasis in the adjacent lower lobes. No other focal pulmonary abnormality. 2. Increased body wall anasarca and small-volume abdominal and pelvic ascites. 3. Cardiomegaly with aortic and coronary artery atherosclerosis, extensive vascular calcifications in the abdomen including visceral arteries. 4. Increased prominence of the pulmonary trunk.  No aortic aneurysm. 5. Significantly improved findings of pancreatitis with only minimal peripancreatic stranding today. 6. Since 01/31/2021, interval CBD stenting and resolution of choledocholithiasis and biliary dilatation. 7. Cholelithiasis with mild gallbladder thickening and pericholecystic fluid which was seen on both prior studies and could be congestive. Correlate clinically for cholecystitis. 8. Renal atrophy and cysts with 1.3 cm probable hyperdense cyst left superior pole. MRI or ultrasound recommended to assess for internal complexity. 9. Possible cystitis. 10. Hip hardware obscuring visualization of the lower bladder, rectum and pelvic sidewalls. 11. Probable gastritis and additional findings discussed above. No intestinal dilatation or wall thickening. Diverticulosis. Electronically Signed   By: Telford Nab M.D.   On: 02/20/2021 22:20     Assessment and Plan:   NSTEMI - he is asymptomatic and presentation was altered mentation. I would not work this up further. Repeat troponin tomorrow. I do not feel like IV heparin would benefit at this point. Uptitrate beta blocker as bp allows. ESRD - note HD plans.  Hypokalemia - replete Volume overload/acute on chronic systolic heart failure - better after HD.    Risk Assessment/Risk Scores:     TIMI Risk Score for Unstable Angina or Non-ST Elevation MI:   The patient's TIMI risk score is  , which indicates a  % risk of all cause mortality, new or recurrent myocardial infarction or need for urgent revascularization in the next 14 days.  New York Heart Association  (NYHA) Functional Class NYHA Class III  CHA2DS2-VASc Score =   4  This indicates a  % annual risk of stroke. The patient's score is based upon:           For questions or updates, please contact [place] Please consult [email or web address] for contact info under    Signed, Cristopher Peru, MD  02/21/2021 11:13 AM

## 2021-02-21 NOTE — Evaluation (Signed)
Clinical/Bedside Swallow Evaluation Patient Details  Name: Antonio Phillips MRN: [id number] Date of Birth: 11-11-1945  Today's Date: 02/21/2021 Time: SLP Start Time (ACUTE ONLY): 0915 SLP Stop Time (ACUTE ONLY): 0941 SLP Time Calculation (min) (ACUTE ONLY): 26 min  Past Medical History:  Past Medical History:  Diagnosis Date   Demand ischemia ([place])    a. 11/2017 elev Trop in setting of AFib/SVT-->Echo [facility]): EF>55%. Mild LVH. Nl RV fxn.   Diabetes ([place])    a. Pt states that he has no hx of diabetes--A1c 5.5 11/2018.   ESRD (end stage renal disease) ([place])    a. On HD since 2019.   Hyperlipidemia    Hypotension    a. On midodrine.   Morbid obesity ([place])    PAD (peripheral artery disease) ([place])    a. s/p L AKA and R BKA.   Permanent atrial fibrillation ([place])    a. Dx 2019. CHA2DS2VASc = 3-4-->Eliquis.   Past Surgical History:  Past Surgical History:  Procedure Laterality Date   AV FISTULA REPAIR     BELOW KNEE LEG AMPUTATION Bilateral    CHOLECYSTECTOMY     ERCP N/A 02/04/2021   Procedure: ENDOSCOPIC RETROGRADE CHOLANGIOPANCREATOGRAPHY (ERCP);  Surgeon: Lucilla Lame, MD;  Location: [facility] ENDOSCOPY;  Service: Endoscopy;  Laterality: N/A;   LEFT HEART CATH AND CORONARY ANGIOGRAPHY N/A 09/22/2020   Procedure: LEFT HEART CATH AND CORONARY ANGIOGRAPHY;  Surgeon: Sherren Mocha, MD;  Location: [place] CV LAB;  Service: Cardiovascular;  Laterality: N/A;   TOTAL HIP ARTHROPLASTY Bilateral    HPI:  Per 53 H&P "75  y.o with significant PMH of ESRD on HD, CAD, gallstone pancreatitis s/p ERCP and stenting of the CBD 12.1.2022., chronic thrombocytopenia, 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 fib on Eliquis, CAD/NSTEMI s/p DES stent to obtuse marginal mild RCA stenosis, mild to mod pLAD disease, HFmrEF who was sent to the ED from [facility] due to patient being too lethargic for HD.     ED Course: On arrival to the ED, he was febrile temp 100.4 with blood  pressure 134/90 mm Hg and pulse rate  115 beats/min with monitor showing atrial fibrillation,  sats 88%. There were no focal neurological deficits; however patient was noted to be very altered and lethargic.  Na+/ K+: 138/3.0, Glucose: 124, BUN/Cr.: 24/3.94, WBC/ TMAX: 5.4/100.4, Hgb/Hct: 11.3/36.7, Plts: 68, PCT: 2.17, Lactic acid: 2.4, Ammonia: 22, COVID PCR: Negative, Troponin: 6237>6283, BNP: >4500, Arterial Blood Gas result:  pO2 96; pCO2 39; pH 7.45;  HCO3 27.1, %O2 Sat 97.8. EKG: shows A. fib at a rate of 81 normal axis no acute ST-T wave changes.  No ST elevation or depression     Chest x-ray obtained showed cardiomegaly with interstitial pulmonary edema and bibasilar opacity which could be alveolar edema, atelectasis or infection.  CT chest abdomen pelvis showed small to moderate bilateral pleural effusion with consolidation in the lower lobes, increased body wall anasarca and small volume abdominal and pelvic ascites.  CT head showed no acute intracranial abnormality.  Given finding as above, code sepsis was initiated and patient started on broad-spectrum antibiotics.  Nephrology was consulted as patient will need HD for fluid removal.  Due to significantly elevated troponins, heparin was considered however patient was noted to be oozing from his dialysis and IV sites.  Heparin held for now pending coags and cardiology consult.  No dynamic EKG changes were noted.  Patient remained lethargic and altered with high risk for intubation.  PCCM consulted for admission and further management."    Assessment / Plan / Recommendation  Clinical Impression  Pt seen for clinical swallowing evaluation. Pt alert, pleasant. Mild confusion noted. HOH. Cleared with RN. Pt reported difficulty chewing due to dental status and occasional globus sensation with pills and solid food PTA. When asked to localize globus sensation, pt pointed retrosternally.   Per chart review, CT Chest, 12/18, "1. Mild features of CHF, with  increased small to moderate bilateral layering pleural effusions and with consolidation or atelectasis in the adjacent lower lobes. No other focal pulmonary abnormality...."  Pt given trials of ice chips (x3), thin liquids (~6 oz total; via tsp, cup, and straw), applesauce (4 oz), and saltines (x2 presented in halves). Pt presents with s/sx mild-moderate oral dysphagia c/b prolonged/inefficient mastication of solids and trace lingual residual which reduced with unprompted liquid wash. No overt s/sx pharyngeal dysphagia noted across trials. To palpation, seemingly timely swallow initiation and seemingly adequate laryngeal elevation. No change to vocal quality. Complaints of globus sensation not reproduced.   Recommend initiation of Dysphagia 2 diet with thin liquids and safe swallowing strategies as outlined below. Pt will need set up with meals and assistance with feeding PRN.   Pt is at increased risk for aspiration/aspiration PNA given AMS, dental status, multiple medical comorbidities, and overall deconditioning. Risks appear to be reduced with altered consistency diet with safe swallowing strategies/aspiration precaution.   SLP to f/u per POC for diet tolerance.  Pt may benefit from GI consult acute/post-acute if globus sensation persists.   Pt and RN made aware of results, recommendations, and safe swallowing strategies. Pt verbalized understanding/agreement.     SLP Visit Diagnosis: Dysphagia, oral phase (R13.11)    Aspiration Risk  Mild aspiration risk    Diet Recommendation Dysphagia 2 (Fine chop);Thin liquid (extra gravies/sauces to moisten food)   Medication Administration: Crushed with puree Supervision: Intermittent supervision to cue for compensatory strategies (assistance PRN) Compensations: Minimize environmental distractions;Slow rate;Small sips/bites;Follow solids with liquid Postural Changes: Seated upright at 90 degrees;Remain upright for at least 30 minutes after po  intake    Other  Recommendations Recommended Consults: Consider GI evaluation Oral Care Recommendations: Oral care BID    Recommendations for follow up therapy are one component of a multi-disciplinary discharge planning process, led by the attending physician.  Recommendations may be updated based on patient status, additional functional criteria and insurance authorization.  Follow up Recommendations Acute inpatient rehab (3hours/day)      Assistance Recommended at Discharge Intermittent Supervision/Assistance  Functional Status Assessment Patient has had a recent decline in their functional status and demonstrates the ability to make significant improvements in function in a reasonable and predictable amount of time.  Frequency and Duration min 2x/week  2 weeks       Prognosis Prognosis for Safe Diet Advancement: Fair Barriers to Reach Goals: Severity of deficits      Swallow Study   General Date of Onset: 02/21/21 HPI: Per 51 H&P "75  y.o with significant PMH of ESRD on HD, CAD, gallstone pancreatitis s/p ERCP and stenting of the CBD 12.1.2022., chronic thrombocytopenia, 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 fib on Eliquis, CAD/NSTEMI s/p DES stent to obtuse marginal mild RCA stenosis, mild to mod pLAD disease, HFmrEF who was sent to the ED from [facility] due to patient being too lethargic for HD.     ED Course: On arrival to the ED, he was febrile temp 100.4 with blood  pressure 134/90 mm Hg and pulse rate  115 beats/min with monitor showing atrial fibrillation,  sats 88%. There were no focal neurological deficits; however patient was noted to be very altered and lethargic.  Na+/ K+: 138/3.0, Glucose: 124, BUN/Cr.: 24/3.94, WBC/ TMAX: 5.4/100.4, Hgb/Hct: 11.3/36.7, Plts: 68, PCT: 2.17, Lactic acid: 2.4, Ammonia: 22, COVID PCR: Negative, Troponin: 5056>9794, BNP: >4500, Arterial Blood Gas result:  pO2 96; pCO2 39; pH 7.45;  HCO3 27.1, %O2 Sat 97.8. EKG:  shows A. fib at a rate of 81 normal axis no acute ST-T wave changes.  No ST elevation or depression     Chest x-ray obtained showed cardiomegaly with interstitial pulmonary edema and bibasilar opacity which could be alveolar edema, atelectasis or infection.  CT chest abdomen pelvis showed small to moderate bilateral pleural effusion with consolidation in the lower lobes, increased body wall anasarca and small volume abdominal and pelvic ascites.  CT head showed no acute intracranial abnormality.  Given finding as above, code sepsis was initiated and patient started on broad-spectrum antibiotics.  Nephrology was consulted as patient will need HD for fluid removal.  Due to significantly elevated troponins, heparin was considered however patient was noted to be oozing from his dialysis and IV sites.  Heparin held for now pending coags and cardiology consult.  No dynamic EKG changes were noted.  Patient remained lethargic and altered with high risk for intubation.  PCCM consulted for admission and further management." Type of Study: Bedside Swallow Evaluation Previous Swallow Assessment: unknown Diet Prior to this Study: NPO Temperature Spikes Noted: Yes Respiratory Status: Nasal cannula (2L/min) History of Recent Intubation: No Behavior/Cognition: Pleasant mood;Alert;Cooperative;Confused;Distractible Oral Cavity Assessment: Dry Oral Care Completed by SLP: Yes Oral Cavity - Dentition: Missing dentition Vision: Functional for self-feeding (self-feeding affected by BUE tremor and difficulty grasping with RUE) Self-Feeding Abilities: Needs set up (occasional assist) Patient Positioning: Upright in bed Baseline Vocal Quality: Normal Volitional Cough: Strong Volitional Swallow: Able to elicit    Oral/Motor/Sensory Function Overall Oral Motor/Sensory Function: Within functional limits   Ice Chips Ice chips: Within functional limits Presentation: Self Fed   Thin Liquid Thin Liquid: Within functional  limits Presentation: Self Fed;Cup;Spoon;Straw    Puree Puree: Within functional limits Presentation: Self Fed   Solid     Solid: Impaired Presentation: Self Fed Oral Phase Impairments: Impaired mastication Oral Phase Functional Implications: Impaired mastication;Oral residue     Cherrie Gauze, M.S., [facility] [phone] (ASCOM)  Clearnce Sorrel Truly Stankiewicz 02/21/2021,10:26 AM

## 2021-02-21 NOTE — Progress Notes (Signed)
eLink Physician-Brief Progress Note Patient Name: AEDON DEASON DOB: [birth year] MRN: [id number]   Date of Service  02/21/2021  HPI/Events of Note  84F HD patient with ACS.    eICU Interventions  Stable in room; on nasal canula; not on pressors.  RN's in room     Intervention Category Evaluation Type: New Patient Evaluation  Tilden Dome 02/21/2021, 1:21 AM

## 2021-02-21 NOTE — Plan of Care (Signed)
Discussed with patient plan of care for the evening, pain management and admission questions with some teach back displayed at this time.  Problem: Education: Goal: Knowledge of General Education information will improve Description: Including pain rating scale, medication(s)/side effects and non-pharmacologic comfort measures Outcome: Progressing

## 2021-02-21 NOTE — Progress Notes (Addendum)
Pharmacy Antibiotic Note  Antonio Phillips is a 75 y.o. male TThS HD pt admitted on 02/20/2021 with sepsis.  Pharmacy has been consulted for Cefepime and Vancomycin dosing.  Plan: Pt given initial doses of Cefepime 2 gm (2/17) and Vanc 2500 mg Ordered Cefepime 1 g q24h starting 2/19 Ordered Vancomycin 1000 mg TThS on dialysis days.  Pharmacy will continue to follow and adjust abx dosing as warranted.   Height: 5\' 11"  (180.3 cm) Weight: 97.3 kg (214 lb 8.1 oz) IBW/kg (Calculated) : 75.3  Temp (24hrs), Avg:99 F (37.2 C), Min:98.1 F (36.7 C), Max:100.3 F (37.9 C)  Recent Labs  Lab 02/20/21 1602 02/20/21 2106 02/21/21 0023  WBC 5.4  --   --   CREATININE 3.94*  --   --   LATICACIDVEN  --  2.4* 1.9    Estimated Creatinine Clearance: 19 mL/min (A) (by C-G formula based on SCr of 3.94 mg/dL (H)).    Allergies  Allergen Reactions   Simvastatin Other (See Comments)    Antimicrobials this admission: 12/17 Cefepime >>  12/18 Vancomycin >>  12/17 Flagyl x 1   Microbiology results: 12/17 BCx: Pending 12/17 UCx: Pending   Thank you for allowing pharmacy to be a part of this patients care.  Renda Rolls, PharmD, [facility] 02/21/2021 3:03 AM

## 2021-02-21 NOTE — Consult Note (Signed)
PHARMACY CONSULT NOTE - FOLLOW UP  Pharmacy Consult for Electrolyte Monitoring and Replacement   Recent Labs: Potassium (mmol/L)  Date Value  02/21/2021 3.2 (L)  11/14/2012 3.6   Magnesium (mg/dL)  Date Value  02/21/2021 1.9   Calcium (mg/dL)  Date Value  02/21/2021 8.7 (L)   Calcium, Total (mg/dL)  Date Value  11/14/2012 9.1   Albumin (g/dL)  Date Value  02/20/2021 3.1 (L)  11/14/2012 4.0   Phosphorus (mg/dL)  Date Value  02/21/2021 3.8   Sodium (mmol/L)  Date Value  02/21/2021 141  11/14/2012 141     Assessment: 75  y.o with significant PMH of ESRD on HD, CAD, gallstone pancreatitis s/p ERCP and stenting of the CBD 12.1.2022., chronic thrombocytopenia, 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 fib on Eliquis, CAD/NSTEMI s/p DES stent to obtuse marginal mild RCA stenosis, mild to mod pLAD disease, HFmrEF who was sent to the ED from [facility] due to patient being too lethargic for HD. Tmax of 100.4. on HD TTS.   Goal of Therapy:  WNL  Plan:  Will defer replacement today next HD session Tuesday.  F/u with AM labs.   Oswald Hillock ,PharmD Clinical Pharmacist 02/21/2021 7:45 AM

## 2021-02-21 NOTE — Progress Notes (Signed)
[facility]  ROUNDING NOTE   Subjective:   Antonio Phillips is a 75 year old male with past medical history including hypotension, obesity, peripheral artery disease, diabetes, hyperlipidemia, and end-stage renal disease on dialysis.  Patient presents to emergency department with abdominal pain persisting for 2 days.  Patient admitted for further evaluation for NSTEMI, initial episode of care [facility]) [I21.4] Fever, unspecified fever cause [R50.9] Altered mental status, unspecified altered mental status type [H41.93] Acute metabolic encephalopathy [X90.24] Acute congestive heart failure, unspecified heart failure type ([place]) [I50.9]  Patient well-known to [place].  He completed most of his dialysis session yesterday.  He developed fevers and chills.  Chest CT suggest underlying pneumonia.  There is also the question of aspiration as per nursing.  Being treated with vancomycin.  O2 sat much better this a.m. at 98% on O2 via nasal cannula.  He was actually under his dry weight yesterday.    Objective:  Vital signs in last 24 hours:  Temp:  [98.1 F (36.7 C)-100.3 F (37.9 C)] 98.9 F (37.2 C) (12/18 0400) Pulse Rate:  [75-150] 92 (12/18 0800) Resp:  [11-20] 13 (12/18 0800) BP: (94-138)/(49-90) 106/71 (12/18 0800) SpO2:  [88 %-100 %] 98 % (12/18 0800) Weight:  [97.3 kg] 97.3 kg (12/18 0141)  Weight change:  Filed Weights   02/21/21 0141  Weight: 97.3 kg    Intake/Output: I/O last 3 completed shifts: In: 564.5 [IV Piggyback:564.5] Out: -    Intake/Output this shift:  No intake/output data recorded.  Physical Exam: General: NAD, resting comfortably  Head: Normocephalic, atraumatic. Moist oral mucosal membranes  Eyes: Anicteric  Lungs:  Bilateral crackles normal effort  Heart: Irregular  Abdomen:  Soft, tender, mild distention  Extremities: 2+ peripheral edema.  Right BKA left AKA  Neurologic: Nonfocal, moving all four extremities  Skin: No lesions  Access: Left  AVF    Basic Metabolic Panel: Recent Labs  Lab 02/20/21 1602 02/21/21 0347  NA 138 141  K 3.0* 3.2*  CL 100 104  CO2 26 25  GLUCOSE 124* 93  BUN 24* 27*  CREATININE 3.94* 4.56*  CALCIUM 9.4 8.7*  MG  --  1.9  PHOS  --  3.8     Liver Function Tests: Recent Labs  Lab 02/20/21 1602  AST 18  ALT 9  ALKPHOS 150*  BILITOT 2.2*  PROT 6.8  ALBUMIN 3.1*    No results for input(s): LIPASE, AMYLASE in the last 168 hours.  Recent Labs  Lab 02/21/21 0042  AMMONIA 22     CBC: Recent Labs  Lab 02/20/21 1602 02/21/21 0347  WBC 5.4 5.1  NEUTROABS 4.0  --   HGB 11.3* 10.3*  HCT 36.7* 34.1*  MCV 97.6 100.3*  PLT 68* 61*     Cardiac Enzymes: No results for input(s): CKTOTAL, CKMB, CKMBINDEX, TROPONINI in the last 168 hours.  BNP: Invalid input(s): POCBNP  CBG: Recent Labs  Lab 02/21/21 0115  GLUCAP 105*     Microbiology: Results for orders placed or performed during the hospital encounter of 02/20/21  Resp Panel by RT-PCR (Flu A&B, Covid) Nasopharyngeal Swab     Status: None   Collection Time: 02/20/21  2:49 PM   Specimen: Nasopharyngeal Swab; Nasopharyngeal(NP) swabs in vial transport medium  Result Value Ref Range Status   SARS Coronavirus 2 by RT PCR NEGATIVE NEGATIVE Final    Comment: (NOTE) SARS-CoV-2 target nucleic acids are NOT DETECTED.  The SARS-CoV-2 RNA is generally detectable in upper respiratory specimens during the  acute phase of infection. The lowest concentration of SARS-CoV-2 viral copies this assay can detect is 138 copies/mL. A negative result does not preclude SARS-Cov-2 infection and should not be used as the sole basis for treatment or other patient management decisions. A negative result may occur with  improper specimen collection/handling, submission of specimen other than nasopharyngeal swab, presence of viral mutation(s) within the areas targeted by this assay, and inadequate number of viral copies(<138 copies/mL). A  negative result must be combined with clinical observations, patient history, and epidemiological information. The expected result is Negative.  Fact Sheet for Patients:  EntrepreneurPulse.com.au  Fact Sheet for Healthcare Providers:  IncredibleEmployment.be  This test is no t yet approved or cleared by the [place] FDA and  has been authorized for detection and/or diagnosis of SARS-CoV-2 by FDA under an Emergency Use Authorization (EUA). This EUA will remain  in effect (meaning this test can be used) for the duration of the COVID-19 declaration under Section 564(b)(1) of the Act, 21 U.S.C.section 360bbb-3(b)(1), unless the authorization is terminated  or revoked sooner.       Influenza A by PCR NEGATIVE NEGATIVE Final   Influenza B by PCR NEGATIVE NEGATIVE Final    Comment: (NOTE) The Xpert Xpress SARS-CoV-2/FLU/RSV plus assay is intended as an aid in the diagnosis of influenza from Nasopharyngeal swab specimens and should not be used as a sole basis for treatment. Nasal washings and aspirates are unacceptable for Xpert Xpress SARS-CoV-2/FLU/RSV testing.  Fact Sheet for Patients: EntrepreneurPulse.com.au  Fact Sheet for Healthcare Providers: IncredibleEmployment.be  This test is not yet approved or cleared by the [place] FDA and has been authorized for detection and/or diagnosis of SARS-CoV-2 by FDA under an Emergency Use Authorization (EUA). This EUA will remain in effect (meaning this test can be used) for the duration of the COVID-19 declaration under Section 564(b)(1) of the Act, 21 U.S.C. section 360bbb-3(b)(1), unless the authorization is terminated or revoked.  Performed at [facility], [address] [place], [place] [address]   Culture, blood (single)     Status: None (Preliminary result)   Collection Time: 02/20/21  9:05 PM   Specimen: BLOOD  Result Value Ref Range  Status   Specimen Description BLOOD BLOOD RIGHT FOREARM  Final   Special Requests   Final    BOTTLES DRAWN AEROBIC AND ANAEROBIC Blood Culture results may not be optimal due to an excessive volume of blood received in culture bottles   Culture   Final    NO GROWTH < 12 HOURS Performed at [facility], [address], [place], [place] [address]    Report Status PENDING  Incomplete    Coagulation Studies: Recent Labs    02/21/21 0023  LABPROT 26.2*  INR 2.4*     Urinalysis: No results for input(s): COLORURINE, LABSPEC, PHURINE, GLUCOSEU, HGBUR, BILIRUBINUR, KETONESUR, PROTEINUR, UROBILINOGEN, NITRITE, LEUKOCYTESUR in the last 72 hours.  Invalid input(s): APPERANCEUR    Imaging: DG Chest 2 View  Result Date: 02/20/2021 CLINICAL DATA:  Weakness EXAM: CHEST - 2 VIEW COMPARISON:  Radiograph 02/05/2021 FINDINGS: Unchanged enlarged cardiac silhouette. There are bibasilar airspace opacities. Mild diffuse interstitial opacities. Trace pleural effusions may be present. No visible pneumothorax. No acute osseous abnormality. Thoracic spondylosis. IMPRESSION: Cardiomegaly with interstitial pulmonary edema and bibasilar opacities which could be alveolar edema, atelectasis, or infection. Electronically Signed   By: Maurine Simmering M.D.   On: 02/20/2021 15:28   CT Head Wo Contrast  Result Date: 02/20/2021 CLINICAL DATA:  Altered mental status EXAM: CT HEAD WITHOUT CONTRAST TECHNIQUE: Contiguous axial images were obtained from the base of the skull through the vertex without intravenous contrast. COMPARISON:  12/03/2009 FINDINGS: Brain: Normal anatomic configuration. Parenchymal volume loss is commensurate with the patient's age, though progressive since prior examination. Mild periventricular white matter changes are present likely reflecting the sequela of small vessel ischemia. Physiologic calcification within the globus pallidi again noted. No abnormal intra or extra-axial mass lesion or  fluid collection. No abnormal mass effect or midline shift. No evidence of acute intracranial hemorrhage or infarct. Ventricular size is normal. Cerebellum unremarkable. Vascular: No asymmetric hyperdense vasculature at the skull base. Advanced calcifications are noted within the carotid siphons and distal vertebral arteries bilaterally. Skull: Intact Sinuses/Orbits: Mild mucosal thickening within the frontal sinuses. Remaining paranasal sinuses are clear. Ocular lenses have been removed. Orbits are otherwise unremarkable. Other: There is fluid opacification of the mastoid air cells bilaterally without osseous erosion. Fluid debris is also noted within the left middle ear cavity. IMPRESSION: No acute intracranial abnormality. Progressive senescent change. Mild bifrontal paranasal sinus disease. Bilateral mastoid effusions. Fluid within the left middle ear cavity. Clinical correlation for signs and symptoms of otomastoiditis may be helpful. Electronically Signed   By: Fidela Salisbury M.D.   On: 02/20/2021 21:59   CT CHEST ABDOMEN PELVIS WO CONTRAST  Result Date: 02/20/2021 CLINICAL DATA:  Suspected complicated pneumonia, abdominal discomfort also. End-stage renal failure patient on dialysis. EXAM: CT CHEST, ABDOMEN AND PELVIS WITHOUT CONTRAST TECHNIQUE: Multidetector CT imaging of the chest, abdomen and pelvis was performed following the standard protocol without IV contrast. COMPARISON:  Chest, abdomen and pelvis CT no contrast 01/31/2021, CT angiography chest 09/20/2020. FINDINGS: CT CHEST FINDINGS Cardiovascular: Moderate panchamber cardiomegaly with small stable pericardial effusion. Three-vessel heavy calcific CAD. Increasingly prominent pulmonary trunk measuring 3.8 cm indicating arterial hypertension. Patchy aortic atherosclerosis with tortuosity also noted without aneurysm. Mildly distended superior pulmonary veins. Mediastinum/Nodes: Old right hemithyroidectomy with unremarkable left lobe. Stable small  subcentimeter scattered mediastinal nodes. No bulky or encasing adenopathy. Axillary spaces are clear. Subareolar gynecomastia. Lungs/Pleura: Small to moderate-sized increased layering pleural effusions. Slight increased subpleural septal lines in the bases consistent with interstitial edema. There is consolidation or atelectasis in the posterior lower lobes alongside the effusions, more simple compressive atelectasis posteriorly in the upper lobes. No filling defect in the trachea and central airways. Musculoskeletal: Mild but increased body wall anasarca. 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 Osteopenia and slight thoracic dextroscoliosis. No worrisome thoracic bone lesion. CT ABDOMEN PELVIS FINDINGS Hepatobiliary: No liver mass is seen without contrast. There are stones in the gallbladder, and mild gallbladder thickening or trace pericholecystic fluid but this was seen on both of the prior studies it could simply be congestive. The common bile duct has been stented since the prior study with the distal stent just inside the descending duodenum and interval common bile duct decompression. Pancreas: There is slight peripancreatic stranding, but significantly improved compared to the prior study. 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 Spleen: Stable mild enlargement measuring 15.4 cm AP, with vascular calcifications in the splenic substance. Adrenals/Urinary Tract: No adrenal mass. Atrophic kidneys with bilateral cysts. 1.3 cm probable hyperdense cyst in the superior pole left kidney of 52 Hounsfield units. Consider follow-up ultrasound or MRI to assess for internal complexity. Other cysts are low in density. There are heavy renal vascular calcifications. No hydronephrosis or stone. The bladder  mostly obscured by bilateral hip replacements. The dome is mildly thickened as before. Stomach/Bowel: Increased  thickened folds in the stomach. Probable gastritis. No bowel wall thickening or dilatation including the appendix with sigmoid diverticulosis again seen. There is a surgically widened small bowel segment in the anterior mid abdomen which is unchanged. The rectum is obscured by the hip hardware. Vascular/Lymphatic: Extensive aortoiliac and visceral arterial calcific plaques. No AAA. Multiple subcentimeter up to borderline size retroperitoneal nodes are redemonstrated unchanged. Assessment for pelvic adenopathy very limited due to the hip hardware but no obvious mass. Reproductive: Obscured prostate, unable to evaluate due to the hip hardware. Other: Minimal ascites again noted in the abdomen and pelvis, but increased compared to the prior study with increased body wall anasarca. Small umbilical and inguinal fat hernias. Musculoskeletal: Bilateral hip replacements. Osteopenia and advanced degenerative change of the spine with multilevel bridging osteophytes. Ankylosis right SI joint. 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 IMPRESSION: 1. Mild features of CHF, with increased small to moderate bilateral layering pleural effusions and with consolidation or atelectasis in the adjacent lower lobes. No other focal pulmonary abnormality. 2. Increased body wall anasarca and small-volume abdominal and pelvic ascites. 3. Cardiomegaly with aortic and coronary artery atherosclerosis, extensive vascular calcifications in the abdomen including visceral arteries. 4. Increased prominence of the pulmonary trunk.  No aortic aneurysm. 5. Significantly improved findings of pancreatitis with only minimal peripancreatic stranding today. 6. Since 01/31/2021, interval CBD stenting and resolution of choledocholithiasis and biliary dilatation. 7. Cholelithiasis with mild gallbladder thickening and pericholecystic fluid which was seen on both prior studies and could be congestive. Correlate  clinically for cholecystitis. 8. Renal atrophy and cysts with 1.3 cm probable hyperdense cyst left superior pole. MRI or ultrasound recommended to assess for internal complexity. 9. Possible cystitis. 10. Hip hardware obscuring visualization of the lower bladder, rectum and pelvic sidewalls. 11. Probable gastritis and additional findings discussed above. No intestinal dilatation or wall thickening. Diverticulosis. Electronically Signed   By: Telford Nab M.D.   On: 02/20/2021 22:20     Medications:    [START ON 02/22/2021] ceFEPime (MAXIPIME) IV     [START ON 02/23/2021] vancomycin         Assessment/ Plan:  Antonio Phillips is a 75 y.o.  male with past medical history including hypotension, obesity, peripheral artery disease, diabetes, hyperlipidemia, and end-stage renal disease on dialysis.  Patient presents to emergency department with abdominal pain persisting for 2 days.  Patient admitted for further evaluation for NSTEMI, initial episode of care ([place]) [I21.4] Fever, unspecified fever cause [R50.9] Altered mental status, unspecified altered mental status type [H41.74] Acute metabolic encephalopathy [Y81.44] Acute congestive heart failure, unspecified heart failure type ([place]) [I50.9]  [facility]/TTS/Lt AVF/96kg   End-stage renal disease on HD TTS.  Patient actually presented below his dry weight yesterday.  Ultrafiltration achieved was 0.5 kg.  His O2 sat this a.m. is good at 98%.  Therefore no additional dialysis needed at the moment.  We will reassess for dialysis tomorrow.  2. Anemia of chronic kidney disease Lab Results  Component Value Date   HGB 10.3 (L) 02/21/2021    Hemoglobin currently 10.3.  Consider restarting Epogen with dialysis next treatment.  3. Secondary Hyperparathyroidism: Lab Results  Component Value Date   CALCIUM 8.7 (L) 02/21/2021   PHOS 3.8 02/21/2021  Previously phosphorus well controlled at 3.8.  Continue to monitor  periodically.  4.  Fever.  Patient may be having  pneumonia as there were infiltrates at the bases.  In addition there is some question of aspiration per nursing.  Awaiting swallow study.   LOS: 0 Hillery Bhalla 12/18/20228:45 AM

## 2021-02-21 NOTE — Progress Notes (Signed)
ANTICOAGULATION CONSULT NOTE   Pharmacy Consult for heparin infusion Indication: nstemi trop >2000  Allergies  Allergen Reactions   Simvastatin Other (See Comments)    Patient Measurements:   Heparin Dosing Weight: 99.4 kg  Vital Signs: Temp: 100.3 F (37.9 C) (12/17 2108) Temp Source: Oral (12/17 2108) BP: 104/70 (12/17 2353) Pulse Rate: 75 (12/17 2230)  Labs: Recent Labs    02/20/21 1602 02/20/21 2123 02/20/21 2315  HGB 11.3*  --   --   HCT 36.7*  --   --   PLT 68*  --   --   CREATININE 3.94*  --   --   TROPONINIHS  --  1,557* 2,463*    Estimated Creatinine Clearance: 20.3 mL/min (A) (by C-G formula based on SCr of 3.94 mg/dL (H)).   Medical History: Past Medical History:  Diagnosis Date   Demand ischemia ([place])    a. 11/2017 elev Trop in setting of AFib/SVT-->[facility]): EF>55%. Mild LVH. Nl RV fxn.   Diabetes ([place])    a. Pt states that he has no hx of diabetes--A1c 5.5 11/2018.   ESRD (end stage renal disease) ([place])    a. On HD since 2019.   Hyperlipidemia    Hypotension    a. On midodrine.   Morbid obesity ([place])    PAD (peripheral artery disease) ([place])    a. s/p L AKA and R BKA.   Permanent atrial fibrillation ([place])    a. Dx 2019. CHA2DS2VASc = 3-4-->Eliquis.    Medications:  PTA:  Eliquis 5 mg BID, last dose 12/17 (likely AM per ED arrival time)  Assessment: Pt is 75 yo dialysis pt arriving to ER with AMS, initially found w/ A. Fib with RVR and elevated troponin I.  Goal of Therapy:  Heparin level 0.3-0.7 units/ml aPTT 66-102 seconds Monitor platelets by anticoagulation protocol: Yes   Plan:  Due to pt h/o bleed, NP request to d/c heparin drip/consult prior to starting for now, with plan to f/u with cardiology in AM.  Renda Rolls, PharmD, [facility] 02/21/2021 12:14 AM

## 2021-02-21 NOTE — H&P (Addendum)
NAME:  Antonio Phillips, MRN:  [id number], DOB:  [birth year], LOS: 0 ADMISSION DATE:  02/20/2021, CONSULTATION DATE: 02/21/2021 REFERRING MD: Conni Slipper, MD, CHIEF COMPLAINT: Altered mental status   HPI  75  y.o with significant PMH of ESRD on HD, CAD, gallstone pancreatitis s/p ERCP and stenting of the CBD 12.1.2022., chronic thrombocytopenia, 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 fib on Eliquis, CAD/NSTEMI s/p DES stent to obtuse marginal mild RCA stenosis, mild to mod pLAD disease, HFmrEF who was sent to the ED from [facility] due to patient being too lethargic for HD.  ED Course: On arrival to the ED, he was febrile temp 100.4 with blood pressure 134/90 mm Hg and pulse rate  115 beats/min with monitor showing atrial fibrillation,  sats 88%. There were no focal neurological deficits; however patient was noted to be very altered and lethargic.  Na+/ K+: 138/3.0, Glucose: 124, BUN/Cr.: 24/3.94, WBC/ TMAX: 5.4/100.4, Hgb/Hct: 11.3/36.7, Plts: 68, PCT: 2.17, Lactic acid: 2.4, Ammonia: 22, COVID PCR: Negative, Troponin: 0093>8182, BNP: >4500, Arterial Blood Gas result:  pO2 96; pCO2 39; pH 7.45;  HCO3 27.1, %O2 Sat 97.8. EKG: shows A. fib at a rate of 81 normal axis no acute ST-T wave changes.  No ST elevation or depression  Chest x-ray obtained showed cardiomegaly with interstitial pulmonary edema and bibasilar opacity which could be alveolar edema, atelectasis or infection.  CT chest abdomen pelvis showed small to moderate bilateral pleural effusion with consolidation in the lower lobes, increased body wall anasarca and small volume abdominal and pelvic ascites.  CT head showed no acute intracranial abnormality.  Given finding as above, code sepsis was initiated and patient started on broad-spectrum antibiotics.  Nephrology was consulted as patient will need HD for fluid removal.  Due to significantly elevated troponins, heparin was considered however patient was noted to be  oozing from his dialysis and IV sites.  Heparin held for now pending coags and cardiology consult.  No dynamic EKG changes were noted.  Patient remained lethargic and altered with high risk for intubation.  PCCM consulted for admission and further management.  Past Medical History  ESRD on HD CAD Gallstone pancreatitis s/p ERCP and stenting of the CBD 12.1.2022. Chronic thrombocytopenia PAD s/p L BKA and R BKA Diabetes mellitus Permanent A. fib on Eliquis CAD/NSTEMI s/p DES stent to obtuse marginal mild RCA stenosis, mild to mod pLAD disease.  [place] [facility] Events   12/18: Admitted to ICU with altered mental status  Consults:  Nephrology Cardiology  Procedures:  None  Significant Diagnostic Tests:  12/18: Chest Xray>cardiomegaly with interstitial pulmonary edema and bibasilar opacity which could be alveolar edema, atelectasis or infection. 12/18: Noncontrast CT head> no acute intracranial abnormality 12/18: CT Chest, abdomen and pelvis>1. Mild features of CHF, with increased small to moderate bilateral layering pleural effusions and with consolidation or atelectasis in the adjacent lower lobes. No other focal pulmonary abnormality. 2. Increased body wall anasarca and small-volume abdominal and pelvic ascites. 3. Cardiomegaly with aortic and coronary artery atherosclerosis, extensive vascular calcifications in the abdomen including visceral arteries. 4. Increased prominence of the pulmonary trunk.  No aortic aneurysm. 5. Significantly improved findings of pancreatitis with only minimal peripancreatic stranding today. 6. Since 01/31/2021, interval CBD stenting and resolution of choledocholithiasis and biliary dilatation. 7. Cholelithiasis with mild gallbladder thickening and pericholecystic fluid which was seen on both prior studies and could be congestive. Correlate clinically for cholecystitis. 8. Renal atrophy and cysts with  1.3 cm probable hyperdense cyst  left superior pole. MRI or ultrasound recommended to assess for internal complexity. 9. Possible cystitis. 10. Hip hardware obscuring visualization of the lower bladder, rectum and pelvic sidewalls. 11. Probable gastritis and additional findings discussed above. No intestinal dilatation or wall thickening. Diverticulosis.  Micro Data:  12/18: SARS-CoV-2 PCR> negative 12/18: Influenza PCR> negative 12/18: Blood culture x2> 12/18: Urine Culture> 12/18: MRSA PCR>>  12/18: Strep pneumo urinary antigen> 12/18: Legionella urinary antigen>  Antimicrobials:  Vancomycin: 12/18>> Cefepime: 12/18>> Metronidazole: 12/18>>  OBJECTIVE  Blood pressure 104/70, pulse 75, temperature 100.3 F (37.9 C), temperature source Oral, resp. rate 15, SpO2 100 %.        Intake/Output Summary (Last 24 hours) at 02/21/2021 0013 Last data filed at 02/20/2021 2230 Gross per 24 hour  Intake 34.5 ml  Output --  Net 34.5 ml   There were no vitals filed for this visit.   Physical Examination  GENERAL: 75 year-old patient lying in the bed ill appearing and lethargic EYES: Pupils equal, round, reactive to light and accommodation. No scleral icterus. Extraocular muscles intact.  HEENT: Head atraumatic, normocephalic. Oropharynx and nasopharynx clear.  NECK:  Supple, no jugular venous distention. No thyroid enlargement, no tenderness.  LUNGS: Decreased breath sounds bilaterally, no wheezing, rales,rhonchi or crepitation. No use of accessory muscles of respiration.  CARDIOVASCULAR: Irregular heart rythm. No murmurs, rubs, or gallops.  ABDOMEN: Soft, nontender, distended. Bowel sounds present. No organomegaly or mass.  EXTREMITIES: Bilateral dependent edema of lower extremities, No cyanosis, or clubbing. Right below-knee amputation, left above-knee amputation NEUROLOGIC: Cranial nerves II through XII are intact.  Muscle strength 5/5 in all extremities. Sensation intact.  PSYCHIATRIC: The patient is alert and  oriented x 1 SKIN: No obvious rash, lesion, Left stump ulcer.   Labs/imaging that I havepersonally reviewed  (right click and "Reselect all SmartList Selections" daily)     Labs   CBC: Recent Labs  Lab 02/20/21 1602  WBC 5.4  NEUTROABS 4.0  HGB 11.3*  HCT 36.7*  MCV 97.6  PLT 68*    Basic Metabolic Panel: Recent Labs  Lab 02/20/21 1602  NA 138  K 3.0*  CL 100  CO2 26  GLUCOSE 124*  BUN 24*  CREATININE 3.94*  CALCIUM 9.4   GFR: Estimated Creatinine Clearance: 20.3 mL/min (A) (by C-G formula based on SCr of 3.94 mg/dL (H)). Recent Labs  Lab 02/20/21 1602  WBC 5.4    Liver Function Tests: Recent Labs  Lab 02/20/21 1602  AST 18  ALT 9  ALKPHOS 150*  BILITOT 2.2*  PROT 6.8  ALBUMIN 3.1*   No results for input(s): LIPASE, AMYLASE in the last 168 hours. No results for input(s): AMMONIA in the last 168 hours.  ABG    Component Value Date/Time   PHART 7.45 02/20/2021 2343   PCO2ART 39 02/20/2021 2343   PO2ART 96 02/20/2021 2343   HCO3 27.1 02/20/2021 2343   ACIDBASEDEF 0.9 09/09/2019 2259   O2SAT 97.8 02/20/2021 2343     Coagulation Profile: No results for input(s): INR, PROTIME in the last 168 hours.  Cardiac Enzymes: No results for input(s): CKTOTAL, CKMB, CKMBINDEX, TROPONINI in the last 168 hours.  HbA1C: Hgb A1c MFr Bld  Date/Time Value Ref Range Status  11/10/2018 11:06 AM 5.5 4.8 - 5.6 % Final    Comment:    (NOTE) Pre diabetes:          5.7%-6.4% Diabetes:              >  6.4% Glycemic control for   <7.0% adults with diabetes     CBG: No results for input(s): GLUCAP in the last 168 hours.  Review of Systems:   UNABLE TO OBTAIN PATIENT IS CURRENTLY ALTERED  Past Medical History  He,  has a past medical history of Demand ischemia ([place]), Diabetes ([place]), ESRD (end stage renal disease) ([place]), Hyperlipidemia, Hypotension, Morbid obesity ([place]), PAD (peripheral artery disease) ([place]), and Permanent atrial fibrillation ([place]).   Surgical  History    Past Surgical History:  Procedure Laterality Date   AV FISTULA REPAIR     BELOW KNEE LEG AMPUTATION Bilateral    CHOLECYSTECTOMY     ERCP N/A 02/04/2021   Procedure: ENDOSCOPIC RETROGRADE CHOLANGIOPANCREATOGRAPHY (ERCP);  Surgeon: Lucilla Lame, MD;  Location: [facility] ENDOSCOPY;  Service: Endoscopy;  Laterality: N/A;   LEFT HEART CATH AND CORONARY ANGIOGRAPHY N/A 09/22/2020   Procedure: LEFT HEART CATH AND CORONARY ANGIOGRAPHY;  Surgeon: Sherren Mocha, MD;  Location: [facility];  Service: Cardiovascular;  Laterality: N/A;   TOTAL HIP ARTHROPLASTY Bilateral      Social History   reports that he has never smoked. He has never used smokeless tobacco. He reports that he does not drink alcohol and does not use drugs.   Family History   His family history is not on file.   Allergies Allergies  Allergen Reactions   Simvastatin Other (See Comments)     Home Medications  Prior to Admission medications   Medication Sig Start Date End Date Taking? Authorizing Provider  acetaminophen (TYLENOL) 325 MG tablet Take 2 tablets (650 mg total) by mouth every 6 (six) hours as needed for mild pain or moderate pain. 01/17/20  Yes Wieting, Richard, MD  apixaban (ELIQUIS) 5 MG TABS tablet Take 1 tablet (5 mg total) by mouth 2 (two) times daily. 01/17/20  Yes Wieting, Richard, MD  atorvastatin (LIPITOR) 40 MG tablet Take 40 mg by mouth at bedtime. 09/17/20  Yes [provider]  Carboxymethylcellulose Sod PF 0.5 % SOLN Place 1 drop into both eyes 3 (three) times daily as needed (dry eyes).   Yes [provider]  clopidogrel (PLAVIX) 75 MG tablet Take 1 tablet (75 mg total) by mouth daily with breakfast. 09/23/20  Yes Wieting, Richard, MD  dextromethorphan-guaiFENesin [facility] DM) 30-600 MG 12hr tablet Take 1 tablet by mouth 2 (two) times daily. 02/09/21  Yes Lorella Nimrod, MD  diclofenac Sodium (VOLTAREN) 1 % GEL Apply 2 g topically in the morning. (Apply to right stump)    Yes [provider]  finasteride (PROSCAR) 5 MG tablet Take 5 mg by mouth daily. 03/13/19  Yes [provider]  HYDROcodone-acetaminophen (NORCO/VICODIN) 5-325 MG tablet Take 1 tablet by mouth every 6 (six) hours as needed for severe pain. 09/23/20  Yes Wieting, Richard, MD  lactulose, encephalopathy, (CHRONULAC) 10 GM/15ML SOLN Take 10 g by mouth 2 (two) times daily as needed (mild constipation).   Yes [provider]  loperamide (IMODIUM A-D) 2 MG tablet Take 2 tablets (4 mg total) by mouth 4 (four) times daily as needed for diarrhea or loose stools. 09/09/19  Yes Carrie Mew, MD  loratadine (CLARITIN) 10 MG tablet Take 10 mg by mouth See admin instructions. Take 10 mg by mouth daily on Monday, Wednesday, Friday and Sunday   Yes [provider]  metoprolol tartrate (LOPRESSOR) 25 MG tablet Take 0.5 tablets (12.5 mg total) by mouth 2 (two) times daily. 02/09/21  Yes Lorella Nimrod, MD  midodrine ([place]) 10  MG tablet Take 1 tablet (10 mg total) by mouth 3 (three) times daily with meals. 02/09/21  Yes Lorella Nimrod, MD  pregabalin (LYRICA) 100 MG capsule Take 1 capsule (100 mg total) by mouth 2 (two) times daily. 02/09/21  Yes Lorella Nimrod, MD  rOPINIRole (REQUIP) 1 MG tablet Take 1 mg by mouth at bedtime. 03/13/19  Yes [provider]  sevelamer carbonate (RENVELA) 800 MG tablet Take 1,600 mg by mouth See admin instructions. Take 2 tablets (1600mg ) by mouth three times daily with meals on Monday, Wednesday, Friday and Sunday   Yes [provider]  sevelamer carbonate (RENVELA) 800 MG tablet Take 800 mg by mouth See admin instructions. Take 2 tablets (1600mg ) by mouth twice daily with meals (breakfast and dinner) on Tuesday, Thursday and Saturday   Yes [provider]  sodium hypochlorite (DAKIN'S 1/4 STRENGTH) 0.125 % SOLN Apply 1 application topically See admin instructions. Use as directed after cleansing left stump wound - use twice daily  and as needed 01/15/21  Yes [provider]  vitamin B-12 (CYANOCOBALAMIN) 1000 MCG tablet Take 1,000 mcg by mouth daily.   Yes [provider]  Scheduled Meds: Continuous Infusions:  vancomycin 1,000 mg (02/21/21 0010)   Followed by   vancomycin     PRN Meds:.docusate sodium, polyethylene glycol   Assessment & Plan:  Sepsis without septic shock due to suspected Pneumonia and probable UTI Lactic: 2.4, Baseline PCT: Pending, CT: Pneumonia and possible cystitis  -Supplemental oxygen as needed, to maintain SpO2 > 90% -Follow blood cultures, trend lactic/ PCT -Monitor WBC/ fever curve -Continue abx with cefepime & vancomycin  -Pressors for MAP goal >65 -Strict I/O's  Acute Metabolic Encephalopathy  -Provide supportive care -CT head negative for acute intracranial abnormality -Check ammonia levels, TSH -Continue home lactulose  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% by echo in 09/2020 Volume overloaded, BNP >45000 -Beta-blocker on hold in the setting of hypotension requiring midodrine -Volume management per nephrology/HD  CAD/NSTEMI s/p DES to 2nd Margin on 09/22/20 HsT elevated 2585>2778 Suspect Demand Ischemia in the absence of dynamic EKG change -Serial EKGs -Trend Troponin -ASA 81mg  PO daily /rectal if unable to tolerate po -Clopidogrel 75mg  PO daily  -Atorvastatin 40mg  PO daily, check lipid panel  -Metoprolol as BP permits -Anti-coagulation (Heparin drip per ACS protocol) however this is being held due to current active bleeding from Dialysis and IV sites -Cardiology Consult / Interventional Assessment  Atrial Fibrillation now in RVR -Rate controlled for now, holding metoprolol in the setting of hypotension -Will consider Amiodarone if remains in RVR -CHA2DS2-VASc at least 5 (CHF, age x2, DM2, vascular disease) -Eliquis HELD due to suspected bleeding, resume when stable  ESRD on Hemodialysis on TTS/Lt AVF BUN/Cr Last HD on 12/17 however did not  complete session due to lethargy. -Monitor I&O's / urinary output -Follow BMP -Ensure adequate renal perfusion -Avoid nephrotoxic agents as able -Replace electrolytes as indicated -Nephrology consult, will need HD for volume removal  Chronic Thrombocytopenia Splenomegaly without cirrhosis -Current platelet 68 likely in the setting of acute issues as above -Will check PT/PTT/INR -Previously evaluated by Hematology during last admission, if continue to drop would consider their input to assist with management.  -Will continue to monitor s/s of bleeding  Anemia of chronic Disease -Monitor for S/Sx of bleeding -Trend CBC -SCD's for VTE Prophylaxis (no chemical prophylaxis) -Transfuse for Hgb <8  Best practice:  Diet:  NPO Pain/Anxiety/Delirium protocol (if indicated): No VAP protocol (if indicated): Not indicated DVT prophylaxis:  Contraindicated GI prophylaxis: N/A Glucose control:  SSI No Central venous access:  N/A Arterial line:  N/A Foley:  N/A Mobility:  bed rest  PT consulted: N/A Last date of multidisciplinary goals of care discussion [12/18] Code Status:  full code Disposition: ICU   = Goals of Care = Code Status Order: FULL   Wishes to pursue full aggressive treatment and intervention options, including CPR and intubation, but goals of care will be addressed on going with family if that should become necessary.   Critical care time: 45 minutes     Rufina Falco, DNP, CCRN, FNP-C, AGACNP-BC Acute Care Nurse Practitioner  [facility] Pager: [phone] [place] [facility]  .

## 2021-02-22 ENCOUNTER — Inpatient Hospital Stay (HOSPITAL_COMMUNITY)
Admit: 2021-02-22 | Discharge: 2021-02-22 | Disposition: A | Discharge status: Home / Self Care | Payer: Medicare Other | Attending: Cardiovascular Disease | Admitting: Cardiovascular Disease

## 2021-02-22 DIAGNOSIS — I5022 Chronic systolic (congestive) heart failure: Secondary | ICD-10-CM

## 2021-02-22 DIAGNOSIS — R778 Other specified abnormalities of plasma proteins: Secondary | ICD-10-CM

## 2021-02-22 DIAGNOSIS — I248 Other forms of acute ischemic heart disease: Secondary | ICD-10-CM

## 2021-02-22 LAB — ECHOCARDIOGRAM COMPLETE
AR max vel: 1.46 cm2
AV Area VTI: 1.56 cm2
AV Area mean vel: 1.44 cm2
AV Mean grad: 9 mmHg
AV Peak grad: 17.6 mmHg
Ao pk vel: 2.1 m/s
Area-P 1/2: 5.68 cm2
Height: 71 in
MV VTI: 2.42 cm2
S' Lateral: 3.64 cm
Weight: 3499.14 oz

## 2021-02-22 LAB — BASIC METABOLIC PANEL
Anion gap: 10 (ref 5–15)
BUN: 44 mg/dL — ABNORMAL HIGH (ref 8–23)
CO2: 25 mmol/L (ref 22–32)
Calcium: 8.3 mg/dL — ABNORMAL LOW (ref 8.9–10.3)
Chloride: 103 mmol/L (ref 98–111)
Creatinine, Ser: 5.51 mg/dL — ABNORMAL HIGH (ref 0.61–1.24)
GFR, Estimated: 10 mL/min — ABNORMAL LOW (ref >60)
Glucose, Bld: 134 mg/dL — ABNORMAL HIGH (ref 70–99)
Potassium: 3.3 mmol/L — ABNORMAL LOW (ref 3.5–5.1)
Sodium: 138 mmol/L (ref 135–145)

## 2021-02-22 LAB — PHOSPHORUS: Phosphorus: 4.5 mg/dL (ref 2.5–4.6)

## 2021-02-22 LAB — MAGNESIUM: Magnesium: 1.8 mg/dL (ref 1.7–2.4)

## 2021-02-22 LAB — PROCALCITONIN: Procalcitonin: 3.24 ng/mL

## 2021-02-22 MED ORDER — CLOPIDOGREL BISULFATE 75 MG PO TABS
75.0000 mg | ORAL_TABLET | Freq: Every day | ORAL | Status: DC
  Administered 2021-02-22 – 2021-02-23 (×2): 75 mg via ORAL
  Filled 2021-02-22 (×2): qty 1

## 2021-02-22 MED ORDER — PERFLUTREN LIPID MICROSPHERE
1.0000 mL | INTRAVENOUS | Status: AC | PRN
Start: 2021-02-22 — End: 2021-02-22
  Administered 2021-02-22: 13:00:00 2 mL via INTRAVENOUS
  Filled 2021-02-22: qty 10

## 2021-02-22 MED ORDER — APIXABAN 5 MG PO TABS
5.0000 mg | ORAL_TABLET | Freq: Two times a day (BID) | ORAL | Status: DC
  Administered 2021-02-22 – 2021-02-23 (×3): 5 mg via ORAL
  Filled 2021-02-22 (×3): qty 1

## 2021-02-22 MED ORDER — NEPRO/CARBSTEADY PO LIQD
237.0000 mL | Freq: Three times a day (TID) | ORAL | Status: DC
  Administered 2021-02-23 – 2021-03-03 (×8): 237 mL via ORAL

## 2021-02-22 MED ORDER — RENA-VITE PO TABS
1.0000 | ORAL_TABLET | Freq: Every day | ORAL | Status: DC
  Administered 2021-02-22 – 2021-03-02 (×8): 1 via ORAL
  Filled 2021-02-22 (×10): qty 1

## 2021-02-22 NOTE — Progress Notes (Signed)
*  PRELIMINARY RESULTS* Echocardiogram 2D Echocardiogram has been performed.  Wallie Char Jessika Rothery 02/22/2021, 12:57 PM

## 2021-02-22 NOTE — Progress Notes (Signed)
Speech Language Pathology Treatment:    Patient Details Name: Antonio Phillips MRN: [id number] DOB: 1946-03-03 Today's Date: 02/22/2021 Time: 1030-1050 SLP Time Calculation (min) (ACUTE ONLY): 20 min  Assessment / Plan / Recommendation Clinical Impression  Pt seen for diet tolerance. Upon SLP entrance to room, pt consuming breakfast tray. Pt alert, pleasant, and cooperative.   Pt observed directly with breakfast tray consisting of scrambled egg, grits, diced potatoes, and milk. Pt able to feed self with set-up assistance. Pt's oral phase was functional with Dysphagia 2 diet and thin liquids. Adequate oral clearance with solids when pt followed solids with unprompted liquid wash. No overt s/sx pharyngeal dysphagia. No appreciable changes to vocal quality or vital signs with POs. Pt denied complaints of globus sensation with Dysphagia 2 diet and thin liquids which pt noted on regular consistency diet 02/21/21.  Per chart review, WBC and temp WNL. No recent chest imaging. Pt maintaining SpO2 on room air for duration of treatment session.   Recommend continuation of Dysphagia 2 diet with thin liquids and safe swallowing strategies/aspiration precautions as outlined below.   Reviewed diet recommendations, safe swallowing strategies/aspiration precautions, and SLP POC with pt and RN. Pt verbalized understanding/agreement.   SLP to sign off as pt has no acute SLP needs at this time. Should pt exhibit increased complaints of globus sensation consider esophageal work up/GI consult.    HPI HPI: Per 9 H&P "75  y.o with significant PMH of ESRD on HD, CAD, gallstone pancreatitis s/p ERCP and stenting of the CBD 12.1.2022., chronic thrombocytopenia, 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 fib on Eliquis, CAD/NSTEMI s/p DES stent to obtuse marginal mild RCA stenosis, mild to mod pLAD disease, HFmrEF who was sent to the ED from [facility] due to patient being too lethargic for  HD.     ED Course: On arrival to the ED, he was febrile temp 100.4 with blood pressure 134/90 mm Hg and pulse rate  115 beats/min with monitor showing atrial fibrillation,  sats 88%. There were no focal neurological deficits; however patient was noted to be very altered and lethargic.  Na+/ K+: 138/3.0, Glucose: 124, BUN/Cr.: 24/3.94, WBC/ TMAX: 5.4/100.4, Hgb/Hct: 11.3/36.7, Plts: 68, PCT: 2.17, Lactic acid: 2.4, Ammonia: 22, COVID PCR: Negative, Troponin: 4235>3614, BNP: >4500, Arterial Blood Gas result:  pO2 96; pCO2 39; pH 7.45;  HCO3 27.1, %O2 Sat 97.8. EKG: shows A. fib at a rate of 81 normal axis no acute ST-T wave changes.  No ST elevation or depression     Chest x-ray obtained showed cardiomegaly with interstitial pulmonary edema and bibasilar opacity which could be alveolar edema, atelectasis or infection.  CT chest abdomen pelvis showed small to moderate bilateral pleural effusion with consolidation in the lower lobes, increased body wall anasarca and small volume abdominal and pelvic ascites.  CT head showed no acute intracranial abnormality.  Given finding as above, code sepsis was initiated and patient started on broad-spectrum antibiotics.  Nephrology was consulted as patient will need HD for fluid removal.  Due to significantly elevated troponins, heparin was considered however patient was noted to be oozing from his dialysis and IV sites.  Heparin held for now pending coags and cardiology consult.  No dynamic EKG changes were noted.  Patient remained lethargic and altered with high risk for intubation.  PCCM consulted for admission and further management."     SLP Plan  All goals met      Recommendations for follow up therapy  are one component of a multi-disciplinary discharge planning process, led by the attending physician.  Recommendations may be updated based on patient status, additional functional criteria and insurance authorization.    Recommendations  Diet recommendations:  Dysphagia 2 (fine chop);Thin liquid Medication Administration: Crushed with puree Supervision: Patient able to self feed Compensations: Minimize environmental distractions;Slow rate;Small sips/bites;Follow solids with liquid Postural Changes and/or Swallow Maneuvers: Out of bed for meals;Seated upright 90 degrees;Upright 30-60 min after meal                Oral Care Recommendations: Oral care BID Follow Up Recommendations: No SLP follow up Assistance recommended at discharge: Intermittent Supervision/Assistance SLP Visit Diagnosis: Dysphagia, oral phase (R13.11) Plan: All goals met         Cherrie Gauze, M.S., [facility] [phone] ([place])   Quintella Baton  02/22/2021, 11:14 AM

## 2021-02-22 NOTE — Consult Note (Signed)
PHARMACY CONSULT NOTE - FOLLOW UP  Pharmacy Consult for Electrolyte Monitoring and Replacement   Recent Labs: Potassium (mmol/L)  Date Value  02/21/2021 3.2 (L)  11/14/2012 3.6   Magnesium (mg/dL)  Date Value  02/21/2021 1.9   Calcium (mg/dL)  Date Value  02/21/2021 8.7 (L)   Calcium, Total (mg/dL)  Date Value  11/14/2012 9.1   Albumin (g/dL)  Date Value  02/20/2021 3.1 (L)  11/14/2012 4.0   Phosphorus (mg/dL)  Date Value  02/21/2021 3.8   Sodium (mmol/L)  Date Value  02/21/2021 141  11/14/2012 141     Assessment: 75  y.o with significant PMH of ESRD on HD, CAD, gallstone pancreatitis s/p ERCP and stenting of the CBD 12.1.2022., chronic thrombocytopenia, 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 fib on Eliquis, CAD/NSTEMI s/p DES stent to obtuse marginal mild RCA stenosis, mild to mod pLAD disease, HFmrEF who was sent to the ED from [facility] due to patient being too lethargic for HD. Tmax of 100.4. on HD TTS.   Labs Na 141>138 K 3.2>3.3 Phos 3.8>4.5 Mg 1.9>1.8  Goal of Therapy:  WNL  Plan:  --Will defer replacement of K given next HD session scheduled for 12/20 --Will continue to monitor electrolytes and replace as clinically indicated  Narda Rutherford, PharmD Pharmacy Resident  02/22/2021 11:45 AM

## 2021-02-22 NOTE — Progress Notes (Signed)
NAME:  Antonio Phillips, MRN:  [id number], DOB:  19-Aug-1945, LOS: 1 ADMISSION DATE:  02/20/2021, CONSULTATION DATE: 02/21/2021 REFERRING MD: Conni Slipper, MD, CHIEF COMPLAINT: Altered mental status   HPI  75  y.o with significant PMH of ESRD on HD, CAD, gallstone pancreatitis s/p ERCP and stenting of the CBD 12.1.2022., chronic thrombocytopenia, 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 fib on Eliquis, CAD/NSTEMI s/p DES stent to obtuse marginal mild RCA stenosis, mild to mod pLAD disease, HFmrEF who was sent to the ED from [facility] due to patient being too lethargic for HD.  02/22/21- patient improved and is transferred to [facility] service.    Past Medical History  ESRD on HD CAD Gallstone pancreatitis s/p ERCP and stenting of the CBD 12.1.2022. Chronic thrombocytopenia PAD s/p L BKA and R BKA Diabetes mellitus Permanent A. fib on Eliquis CAD/NSTEMI s/p DES stent to obtuse marginal mild RCA stenosis, mild to mod pLAD disease.  [facility] Events   12/18: Admitted to ICU with altered mental status  Consults:  Nephrology Cardiology  Procedures:  None  Significant Diagnostic Tests:  12/18: Chest Xray>cardiomegaly with interstitial pulmonary edema and bibasilar opacity which could be alveolar edema, atelectasis or infection. 12/18: Noncontrast CT head> no acute intracranial abnormality 12/18: CT Chest, abdomen and pelvis>1. Mild features of CHF, with increased small to moderate bilateral layering pleural effusions and with consolidation or atelectasis in the adjacent lower lobes. No other focal pulmonary abnormality. 2. Increased body wall anasarca and small-volume abdominal and pelvic ascites. 3. Cardiomegaly with aortic and coronary artery atherosclerosis, extensive vascular calcifications in the abdomen including visceral arteries. 4. Increased prominence of the pulmonary trunk.  No aortic aneurysm. 5. Significantly improved findings  of pancreatitis with only minimal peripancreatic stranding today. 6. Since 01/31/2021, interval CBD stenting and resolution of choledocholithiasis and biliary dilatation. 7. Cholelithiasis with mild gallbladder thickening and pericholecystic fluid which was seen on both prior studies and could be congestive. Correlate clinically for cholecystitis. 8. Renal atrophy and cysts with 1.3 cm probable hyperdense cyst left superior pole. MRI or ultrasound recommended to assess for internal complexity. 9. Possible cystitis. 10. Hip hardware obscuring visualization of the lower bladder, rectum and pelvic sidewalls. 11. Probable gastritis and additional findings discussed above. No intestinal dilatation or wall thickening. Diverticulosis.  Micro Data:  12/18: SARS-CoV-2 PCR> negative 12/18: Influenza PCR> negative 12/18: Blood culture x2> 12/18: Urine Culture> 12/18: MRSA PCR>>  12/18: Strep pneumo urinary antigen> 12/18: Legionella urinary antigen>  Antimicrobials:  Vancomycin: 12/18>> Cefepime: 12/18>> Metronidazole: 12/18>>  OBJECTIVE  Blood pressure 90/65, pulse 94, temperature (!) 97.5 F (36.4 C), temperature source Oral, resp. rate 15, height 5\' 11"  (1.803 m), weight 99.2 kg, SpO2 100 %.        Intake/Output Summary (Last 24 hours) at 02/22/2021 1057 Last data filed at 02/22/2021 0900 Gross per 24 hour  Intake 1080 ml  Output --  Net 1080 ml    Filed Weights   02/21/21 0141 02/22/21 0444  Weight: 97.3 kg 99.2 kg     Physical Examination  GENERAL: 75 year-old patient lying in the bed ill appearing and lethargic EYES: Pupils equal, round, reactive to light and accommodation. No scleral icterus. Extraocular muscles intact.  HEENT: Head atraumatic, normocephalic. Oropharynx and nasopharynx clear.  NECK:  Supple, no jugular venous distention. No thyroid enlargement, no tenderness.  LUNGS: Decreased breath sounds bilaterally, no wheezing, rales,rhonchi or crepitation. No  use of accessory muscles of respiration.  CARDIOVASCULAR: Irregular heart rythm. No murmurs, rubs, or gallops.  ABDOMEN: Soft, nontender, distended. Bowel sounds present. No organomegaly or mass.  EXTREMITIES: Bilateral dependent edema of lower extremities, No cyanosis, or clubbing. Right below-knee amputation, left above-knee amputation NEUROLOGIC: Cranial nerves II through XII are intact.  Muscle strength 5/5 in all extremities. Sensation intact.  PSYCHIATRIC: The patient is alert and oriented x 1 SKIN: No obvious rash, lesion, Left stump ulcer.   Labs/imaging that I havepersonally reviewed  (right click and "Reselect all SmartList Selections" daily)     Labs   CBC: Recent Labs  Lab 02/20/21 1602 02/21/21 0347  WBC 5.4 5.1  NEUTROABS 4.0  --   HGB 11.3* 10.3*  HCT 36.7* 34.1*  MCV 97.6 100.3*  PLT 68* 61*     Basic Metabolic Panel: Recent Labs  Lab 02/20/21 1602 02/21/21 0347 02/22/21 0349  NA 138 141 138  K 3.0* 3.2* 3.3*  CL 100 104 103  CO2 26 25 25   GLUCOSE 124* 93 134*  BUN 24* 27* 44*  CREATININE 3.94* 4.56* 5.51*  CALCIUM 9.4 8.7* 8.3*  MG  --  1.9 1.8  PHOS  --  3.8 4.5    GFR: Estimated Creatinine Clearance: 13.7 mL/min (A) (by C-G formula based on SCr of 5.51 mg/dL (H)). Recent Labs  Lab 02/20/21 1602 02/20/21 2106 02/20/21 2315 02/21/21 0023 02/21/21 0347 02/22/21 0349  PROCALCITON  --   --  2.17  --  2.65 3.24  WBC 5.4  --   --   --  5.1  --   LATICACIDVEN  --  2.4*  --  1.9  --   --      Liver Function Tests: Recent Labs  Lab 02/20/21 1602  AST 18  ALT 9  ALKPHOS 150*  BILITOT 2.2*  PROT 6.8  ALBUMIN 3.1*    No results for input(s): LIPASE, AMYLASE in the last 168 hours. Recent Labs  Lab 02/21/21 0042  AMMONIA 22    ABG    Component Value Date/Time   PHART 7.45 02/20/2021 2343   PCO2ART 39 02/20/2021 2343   PO2ART 96 02/20/2021 2343   HCO3 27.1 02/20/2021 2343   ACIDBASEDEF 0.9 09/09/2019 2259   O2SAT 97.8  02/20/2021 2343      Coagulation Profile: Recent Labs  Lab 02/21/21 0023  INR 2.4*    Cardiac Enzymes: No results for input(s): CKTOTAL, CKMB, CKMBINDEX, TROPONINI in the last 168 hours.  HbA1C: Hgb A1c MFr Bld  Date/Time Value Ref Range Status  11/10/2018 11:06 AM 5.5 4.8 - 5.6 % Final    Comment:    (NOTE) Pre diabetes:          5.7%-6.4% Diabetes:              >6.4% Glycemic control for   <7.0% adults with diabetes     CBG: Recent Labs  Lab 02/21/21 0115  GLUCAP 105*    Review of Systems:   UNABLE TO OBTAIN PATIENT IS CURRENTLY ALTERED  Past Medical History  He,  has a past medical history of Demand ischemia ([place]), Diabetes ([place]), ESRD (end stage renal disease) ([place]), Hyperlipidemia, Hypotension, Morbid obesity ([place]), PAD (peripheral artery disease) ([place]), and Permanent atrial fibrillation ([place]).   Surgical History    Past Surgical History:  Procedure Laterality Date   AV FISTULA REPAIR     BELOW KNEE LEG AMPUTATION Bilateral    CHOLECYSTECTOMY     ERCP N/A 02/04/2021   Procedure: ENDOSCOPIC RETROGRADE CHOLANGIOPANCREATOGRAPHY (ERCP);  Surgeon: Lucilla Lame, MD;  Location: [facility] ENDOSCOPY;  Service: Endoscopy;  Laterality: N/A;   LEFT HEART CATH AND CORONARY ANGIOGRAPHY N/A 09/22/2020   Procedure: LEFT HEART CATH AND CORONARY ANGIOGRAPHY;  Surgeon: Sherren Mocha, MD;  Location: [facility];  Service: Cardiovascular;  Laterality: N/A;   TOTAL HIP ARTHROPLASTY Bilateral      Social History   reports that he has never smoked. He has never used smokeless tobacco. He reports that he does not drink alcohol and does not use drugs.   Family History   His family history is not on file.   Allergies Allergies  Allergen Reactions   Simvastatin Other (See Comments)     Home Medications  Prior to Admission medications   Medication Sig Start Date End Date Taking? Authorizing Provider  acetaminophen (TYLENOL) 325 MG tablet Take 2 tablets (650 mg total)  by mouth every 6 (six) hours as needed for mild pain or moderate pain. 01/17/20  Yes Wieting, Richard, MD  apixaban (ELIQUIS) 5 MG TABS tablet Take 1 tablet (5 mg total) by mouth 2 (two) times daily. 01/17/20  Yes Wieting, Richard, MD  atorvastatin (LIPITOR) 40 MG tablet Take 40 mg by mouth at bedtime. 09/17/20  Yes [provider]  Carboxymethylcellulose Sod PF 0.5 % SOLN Place 1 drop into both eyes 3 (three) times daily as needed (dry eyes).   Yes [provider]  clopidogrel (PLAVIX) 75 MG tablet Take 1 tablet (75 mg total) by mouth daily with breakfast. 09/23/20  Yes Wieting, Richard, MD  dextromethorphan-guaiFENesin [facility] DM) 30-600 MG 12hr tablet Take 1 tablet by mouth 2 (two) times daily. 02/09/21  Yes Lorella Nimrod, MD  diclofenac Sodium (VOLTAREN) 1 % GEL Apply 2 g topically in the morning. (Apply to right stump)   Yes [provider]  finasteride (PROSCAR) 5 MG tablet Take 5 mg by mouth daily. 03/13/19  Yes [provider]  HYDROcodone-acetaminophen (NORCO/VICODIN) 5-325 MG tablet Take 1 tablet by mouth every 6 (six) hours as needed for severe pain. 09/23/20  Yes Wieting, Richard, MD  lactulose, encephalopathy, (CHRONULAC) 10 GM/15ML SOLN Take 10 g by mouth 2 (two) times daily as needed (mild constipation).   Yes [provider]  loperamide (IMODIUM A-D) 2 MG tablet Take 2 tablets (4 mg total) by mouth 4 (four) times daily as needed for diarrhea or loose stools. 09/09/19  Yes Carrie Mew, MD  loratadine (CLARITIN) 10 MG tablet Take 10 mg by mouth See admin instructions. Take 10 mg by mouth daily on Monday, Wednesday, Friday and Sunday   Yes [provider]  metoprolol tartrate (LOPRESSOR) 25 MG tablet Take 0.5 tablets (12.5 mg total) by mouth 2 (two) times daily. 02/09/21  Yes Lorella Nimrod, MD  midodrine (PROAMATINE) 10 MG tablet Take 1 tablet (10 mg total) by mouth 3 (three) times daily with meals. 02/09/21  Yes Lorella Nimrod, MD   pregabalin (LYRICA) 100 MG capsule Take 1 capsule (100 mg total) by mouth 2 (two) times daily. 02/09/21  Yes Lorella Nimrod, MD  rOPINIRole (REQUIP) 1 MG tablet Take 1 mg by mouth at bedtime. 03/13/19  Yes [provider]  sevelamer carbonate (RENVELA) 800 MG tablet Take 1,600 mg by mouth See admin instructions. Take 2 tablets (1600mg ) by mouth three times daily with meals on Monday, Wednesday, Friday and Sunday   Yes [provider]  sevelamer carbonate (RENVELA) 800 MG tablet Take 800 mg by mouth See admin instructions. Take 2 tablets (1600mg ) by mouth  twice daily with meals (breakfast and dinner) on Tuesday, Thursday and Saturday   Yes [provider]  sodium hypochlorite (DAKIN'S 1/4 STRENGTH) 0.125 % SOLN Apply 1 application topically See admin instructions. Use as directed after cleansing left stump wound - use twice daily and as needed 01/15/21  Yes [provider]  vitamin B-12 (CYANOCOBALAMIN) 1000 MCG tablet Take 1,000 mcg by mouth daily.   Yes [provider]  Scheduled Meds:  Chlorhexidine Gluconate Cloth  6 each Topical Daily   lidocaine  1 patch Transdermal Q24H   Continuous Infusions:  ceFEPime (MAXIPIME) IV     PRN Meds:.docusate sodium, HYDROcodone-acetaminophen, HYDROmorphone (DILAUDID) injection, polyethylene glycol   Assessment & Plan:   UTI -sepsis ruled out Lactic: 2.4, Baseline PCT: Pending, CT: Pneumonia and possible cystitis  -Supplemental oxygen as needed, to maintain SpO2 > 90% -Follow blood cultures, trend lactic/ PCT -Monitor WBC/ fever curve -Continue abx with cefepime & vancomycin  -Pressors for MAP goal >65 -Strict I/O's  Acute Metabolic Encephalopathy -resolved -Provide supportive care -CT head negative for acute intracranial abnormality -Check ammonia levels, TSH -Continue home lactulose  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% by echo in 09/2020 Volume overloaded, BNP >45000 -Beta-blocker on hold in the  setting of hypotension requiring midodrine -Volume management per nephrology/HD  CAD/NSTEMI s/p DES to 2nd Margin on 09/22/20 HsT elevated 1607>3710 Suspect Demand Ischemia in the absence of dynamic EKG change -Serial EKGs -Trend Troponin -ASA 81mg  PO daily /rectal if unable to tolerate po -Clopidogrel 75mg  PO daily  -Atorvastatin 40mg  PO daily, check lipid panel  -Metoprolol as BP permits -Anti-coagulation (Heparin drip per ACS protocol) however this is being held due to current active bleeding from Dialysis and IV sites -Cardiology Consult / Interventional Assessment  Atrial Fibrillation now in RVR -Rate controlled for now, holding metoprolol in the setting of hypotension -Will consider Amiodarone if remains in RVR -CHA2DS2-VASc at least 5 (CHF, age x2, DM2, vascular disease) -Eliquis HELD due to suspected bleeding, resume when stable  ESRD on Hemodialysis on TTS/Lt AVF BUN/Cr Last HD on 12/17 however did not complete session due to lethargy. -Monitor I&O's / urinary output -Follow BMP -Ensure adequate renal perfusion -Avoid nephrotoxic agents as able -Replace electrolytes as indicated -Nephrology consult, will need HD for volume removal  Chronic Thrombocytopenia Splenomegaly without cirrhosis -Current platelet 68 likely in the setting of acute issues as above -Will check PT/PTT/INR -Previously evaluated by Hematology during last admission, if continue to drop would consider their input to assist with management.  -Will continue to monitor s/s of bleeding  Anemia of chronic Disease -Monitor for S/Sx of bleeding -Trend CBC -SCD's for VTE Prophylaxis (no chemical prophylaxis) -Transfuse for Hgb <8  Best practice:  Diet:  NPO Pain/Anxiety/Delirium protocol (if indicated): No VAP protocol (if indicated): Not indicated DVT prophylaxis: Contraindicated GI prophylaxis: N/A Glucose control:  SSI No Central venous access:  N/A Arterial line:  N/A Foley:  N/A Mobility:   bed rest  PT consulted: N/A Last date of multidisciplinary goals of care discussion [12/18] Code Status:  full code Disposition: ICU   = Goals of Care = Code Status Order: FULL   Wishes to pursue full aggressive treatment and intervention options, including CPR and intubation, but goals of care will be addressed on going with family if that should become necessary.   Critical care provider statement:   Total critical care time: 33 minutes   Performed by: Lanney Gins MD   Critical care time was exclusive of separately  billable procedures and treating other patients.   Critical care was necessary to treat or prevent imminent or life-threatening deterioration.   Critical care was time spent personally by me on the following activities: development of treatment plan with patient and/or surrogate as well as nursing, discussions with consultants, evaluation of patient's response to treatment, examination of patient, obtaining history from patient or surrogate, ordering and performing treatments and interventions, ordering and review of laboratory studies, ordering and review of radiographic studies, pulse oximetry and re-evaluation of patient's condition.    Ottie Glazier, M.D.  Pulmonary & Critical Care Medicine     .

## 2021-02-22 NOTE — Progress Notes (Signed)
[facility]  ROUNDING NOTE   Subjective:   Antonio Phillips is a 75 year old male with past medical history including hypotension, obesity, peripheral artery disease, diabetes, hyperlipidemia, and end-stage renal disease on dialysis.  Patient presents to emergency department with abdominal pain persisting for 2 days.  Patient admitted for further evaluation for NSTEMI, initial episode of care [facility]) [I21.4] Fever, unspecified fever cause [R50.9] Altered mental status, unspecified altered mental status type [K44.01] Acute metabolic encephalopathy [U27.25] Acute congestive heart failure, unspecified heart failure type ([place]) [I50.9]  Patient appears to be in good spirits today. Due for hemodialysis treatment again tomorrow. Reports that he is having some spasms in his lower extremities.    Objective:  Vital signs in last 24 hours:  Temp:  [97.5 F (36.4 C)-99.5 F (37.5 C)] 97.5 F (36.4 C) (12/19 0800) Pulse Rate:  [52-115] 94 (12/19 1000) Resp:  [10-21] 15 (12/19 1000) BP: (90-121)/(41-76) 90/65 (12/19 1000) SpO2:  [84 %-100 %] 100 % (12/19 1000) Weight:  [99.2 kg] 99.2 kg (12/19 0444)  Weight change: 1.9 kg Filed Weights   02/21/21 0141 02/22/21 0444  Weight: 97.3 kg 99.2 kg    Intake/Output: I/O last 3 completed shifts: In: 1524.5 [P.O.:960; IV Piggyback:564.5] Out: -    Intake/Output this shift:  Total I/O In: 360 [P.O.:360] Out: -   Physical Exam: General: NAD, resting comfortably  Head: Normocephalic, atraumatic. Moist oral mucosal membranes  Eyes: Anicteric  Lungs:  Bilateral crackles normal effort  Heart: Irregular  Abdomen:  Soft, tender, mild distention  Extremities: 2+ peripheral edema.  Right BKA left AKA  Neurologic: Nonfocal, moving all four extremities  Skin: No lesions  Access: Left AVF    Basic Metabolic Panel: Recent Labs  Lab 02/20/21 1602 02/21/21 0347 02/22/21 0349  NA 138 141 138  K 3.0* 3.2* 3.3*  CL 100 104 103   CO2 26 25 25   GLUCOSE 124* 93 134*  BUN 24* 27* 44*  CREATININE 3.94* 4.56* 5.51*  CALCIUM 9.4 8.7* 8.3*  MG  --  1.9 1.8  PHOS  --  3.8 4.5     Liver Function Tests: Recent Labs  Lab 02/20/21 1602  AST 18  ALT 9  ALKPHOS 150*  BILITOT 2.2*  PROT 6.8  ALBUMIN 3.1*    No results for input(s): LIPASE, AMYLASE in the last 168 hours.  Recent Labs  Lab 02/21/21 0042  AMMONIA 22     CBC: Recent Labs  Lab 02/20/21 1602 02/21/21 0347  WBC 5.4 5.1  NEUTROABS 4.0  --   HGB 11.3* 10.3*  HCT 36.7* 34.1*  MCV 97.6 100.3*  PLT 68* 61*     Cardiac Enzymes: No results for input(s): CKTOTAL, CKMB, CKMBINDEX, TROPONINI in the last 168 hours.  BNP: Invalid input(s): POCBNP  CBG: Recent Labs  Lab 02/21/21 0115  GLUCAP 105*     Microbiology: Results for orders placed or performed during the hospital encounter of 02/20/21  Resp Panel by RT-PCR (Flu A&B, Covid) Nasopharyngeal Swab     Status: None   Collection Time: 02/20/21  2:49 PM   Specimen: Nasopharyngeal Swab; Nasopharyngeal(NP) swabs in vial transport medium  Result Value Ref Range Status   SARS Coronavirus 2 by RT PCR NEGATIVE NEGATIVE Final    Comment: (NOTE) SARS-CoV-2 target nucleic acids are NOT DETECTED.  The SARS-CoV-2 RNA is generally detectable in upper respiratory specimens during the acute phase of infection. The lowest concentration of SARS-CoV-2 viral copies this assay can detect is 138  copies/mL. A negative result does not preclude SARS-Cov-2 infection and should not be used as the sole basis for treatment or other patient management decisions. A negative result may occur with  improper specimen collection/handling, submission of specimen other than nasopharyngeal swab, presence of viral mutation(s) within the areas targeted by this assay, and inadequate number of viral copies(<138 copies/mL). A negative result must be combined with clinical observations, patient history, and  epidemiological information. The expected result is Negative.  Fact Sheet for Patients:  EntrepreneurPulse.com.au  Fact Sheet for Healthcare Providers:  IncredibleEmployment.be  This test is no t yet approved or cleared by the [place] FDA and  has been authorized for detection and/or diagnosis of SARS-CoV-2 by FDA under an Emergency Use Authorization (EUA). This EUA will remain  in effect (meaning this test can be used) for the duration of the COVID-19 declaration under Section 564(b)(1) of the Act, 21 U.S.C.section 360bbb-3(b)(1), unless the authorization is terminated  or revoked sooner.       Influenza A by PCR NEGATIVE NEGATIVE Final   Influenza B by PCR NEGATIVE NEGATIVE Final    Comment: (NOTE) The Xpert Xpress SARS-CoV-2/FLU/RSV plus assay is intended as an aid in the diagnosis of influenza from Nasopharyngeal swab specimens and should not be used as a sole basis for treatment. Nasal washings and aspirates are unacceptable for Xpert Xpress SARS-CoV-2/FLU/RSV testing.  Fact Sheet for Patients: EntrepreneurPulse.com.au  Fact Sheet for Healthcare Providers: IncredibleEmployment.be  This test is not yet approved or cleared by the [place] FDA and has been authorized for detection and/or diagnosis of SARS-CoV-2 by FDA under an Emergency Use Authorization (EUA). This EUA will remain in effect (meaning this test can be used) for the duration of the COVID-19 declaration under Section 564(b)(1) of the Act, 21 U.S.C. section 360bbb-3(b)(1), unless the authorization is terminated or revoked.  Performed at [facility], [address] [place], [place] [address]   Culture, blood (single)     Status: None (Preliminary result)   Collection Time: 02/20/21  9:05 PM   Specimen: BLOOD  Result Value Ref Range Status   Specimen Description BLOOD BLOOD RIGHT FOREARM  Final   Special Requests    Final    BOTTLES DRAWN AEROBIC AND ANAEROBIC Blood Culture results may not be optimal due to an excessive volume of blood received in culture bottles   Culture   Final    NO GROWTH 2 DAYS Performed at [facility], [address] [place], [place] [address]    Report Status PENDING  Incomplete  MRSA Next Gen by PCR, Nasal     Status: None   Collection Time: 02/21/21  7:39 AM   Specimen: Nasal Mucosa; Nasal Swab  Result Value Ref Range Status   MRSA by PCR Next Gen NOT DETECTED NOT DETECTED Final    Comment: (NOTE) The GeneXpert MRSA Assay (FDA approved for NASAL specimens only), is one component of a comprehensive MRSA colonization surveillance program. It is not intended to diagnose MRSA infection nor to guide or monitor treatment for MRSA infections. Test performance is not FDA approved in patients less than 45 years old. Performed at [facility], [place] [place], [place] [address]     Coagulation Studies: Recent Labs    02/21/21 0023  LABPROT 26.2*  INR 2.4*     Urinalysis: No results for input(s): COLORURINE, LABSPEC, PHURINE, GLUCOSEU, HGBUR, BILIRUBINUR, KETONESUR, PROTEINUR, UROBILINOGEN, NITRITE, LEUKOCYTESUR in the last 72 hours.  Invalid input(s): APPERANCEUR  Imaging: DG Chest 2 View  Result Date: 02/20/2021 CLINICAL DATA:  Weakness EXAM: CHEST - 2 VIEW COMPARISON:  Radiograph 02/05/2021 FINDINGS: Unchanged enlarged cardiac silhouette. There are bibasilar airspace opacities. Mild diffuse interstitial opacities. Trace pleural effusions may be present. No visible pneumothorax. No acute osseous abnormality. Thoracic spondylosis. IMPRESSION: Cardiomegaly with interstitial pulmonary edema and bibasilar opacities which could be alveolar edema, atelectasis, or infection. Electronically Signed   By: Maurine Simmering M.D.   On: 02/20/2021 15:28   CT Head Wo Contrast  Result Date: 02/20/2021 CLINICAL DATA:  Altered mental status EXAM: CT  HEAD WITHOUT CONTRAST TECHNIQUE: Contiguous axial images were obtained from the base of the skull through the vertex without intravenous contrast. COMPARISON:  12/03/2009 FINDINGS: Brain: Normal anatomic configuration. Parenchymal volume loss is commensurate with the patient's age, though progressive since prior examination. Mild periventricular white matter changes are present likely reflecting the sequela of small vessel ischemia. Physiologic calcification within the globus pallidi again noted. No abnormal intra or extra-axial mass lesion or fluid collection. No abnormal mass effect or midline shift. No evidence of acute intracranial hemorrhage or infarct. Ventricular size is normal. Cerebellum unremarkable. Vascular: No asymmetric hyperdense vasculature at the skull base. Advanced calcifications are noted within the carotid siphons and distal vertebral arteries bilaterally. Skull: Intact Sinuses/Orbits: Mild mucosal thickening within the frontal sinuses. Remaining paranasal sinuses are clear. Ocular lenses have been removed. Orbits are otherwise unremarkable. Other: There is fluid opacification of the mastoid air cells bilaterally without osseous erosion. Fluid debris is also noted within the left middle ear cavity. IMPRESSION: No acute intracranial abnormality. Progressive senescent change. Mild bifrontal paranasal sinus disease. Bilateral mastoid effusions. Fluid within the left middle ear cavity. Clinical correlation for signs and symptoms of otomastoiditis may be helpful. Electronically Signed   By: Fidela Salisbury M.D.   On: 02/20/2021 21:59   CT CHEST ABDOMEN PELVIS WO CONTRAST  Result Date: 02/20/2021 CLINICAL DATA:  Suspected complicated pneumonia, abdominal discomfort also. End-stage renal failure patient on dialysis. EXAM: CT CHEST, ABDOMEN AND PELVIS WITHOUT CONTRAST TECHNIQUE: Multidetector CT imaging of the chest, abdomen and pelvis was performed following the standard protocol without IV  contrast. COMPARISON:  Chest, abdomen and pelvis CT no contrast 01/31/2021, CT angiography chest 09/20/2020. FINDINGS: CT CHEST FINDINGS Cardiovascular: Moderate panchamber cardiomegaly with small stable pericardial effusion. Three-vessel heavy calcific CAD. Increasingly prominent pulmonary trunk measuring 3.8 cm indicating arterial hypertension. Patchy aortic atherosclerosis with tortuosity also noted without aneurysm. Mildly distended superior pulmonary veins. Mediastinum/Nodes: Old right hemithyroidectomy with unremarkable left lobe. Stable small subcentimeter scattered mediastinal nodes. No bulky or encasing adenopathy. Axillary spaces are clear. Subareolar gynecomastia. Lungs/Pleura: Small to moderate-sized increased layering pleural effusions. Slight increased subpleural septal lines in the bases consistent with interstitial edema. There is consolidation or atelectasis in the posterior lower lobes alongside the effusions, more simple compressive atelectasis posteriorly in the upper lobes. No filling defect in the trachea and central airways. Musculoskeletal: Mild but increased body wall anasarca. 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 Osteopenia and slight thoracic dextroscoliosis. No worrisome thoracic bone lesion. CT ABDOMEN PELVIS FINDINGS Hepatobiliary: No liver mass is seen without contrast. There are stones in the gallbladder, and mild gallbladder thickening or trace pericholecystic fluid but this was seen on both of the prior studies it could simply be congestive. The common bile duct has been stented since the prior study with the distal stent just inside the descending duodenum and interval common bile duct decompression. Pancreas: There is slight peripancreatic stranding,  but significantly improved compared to the prior study. 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 Spleen: Stable mild  enlargement measuring 15.4 cm AP, with vascular calcifications in the splenic substance. Adrenals/Urinary Tract: No adrenal mass. Atrophic kidneys with bilateral cysts. 1.3 cm probable hyperdense cyst in the superior pole left kidney of 52 Hounsfield units. Consider follow-up ultrasound or MRI to assess for internal complexity. Other cysts are low in density. There are heavy renal vascular calcifications. No hydronephrosis or stone. The bladder mostly obscured by bilateral hip replacements. The dome is mildly thickened as before. Stomach/Bowel: Increased thickened folds in the stomach. Probable gastritis. No bowel wall thickening or dilatation including the appendix with sigmoid diverticulosis again seen. There is a surgically widened small bowel segment in the anterior mid abdomen which is unchanged. The rectum is obscured by the hip hardware. Vascular/Lymphatic: Extensive aortoiliac and visceral arterial calcific plaques. No AAA. Multiple subcentimeter up to borderline size retroperitoneal nodes are redemonstrated unchanged. Assessment for pelvic adenopathy very limited due to the hip hardware but no obvious mass. Reproductive: Obscured prostate, unable to evaluate due to the hip hardware. Other: Minimal ascites again noted in the abdomen and pelvis, but increased compared to the prior study with increased body wall anasarca. Small umbilical and inguinal fat hernias. Musculoskeletal: Bilateral hip replacements. Osteopenia and advanced degenerative change of the spine with multilevel bridging osteophytes. Ankylosis right SI joint. 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 IMPRESSION: 1. Mild features of CHF, with increased small to moderate bilateral layering pleural effusions and with consolidation or atelectasis in the adjacent lower lobes. No other focal pulmonary abnormality. 2. Increased body wall anasarca and small-volume abdominal and pelvic ascites. 3. Cardiomegaly  with aortic and coronary artery atherosclerosis, extensive vascular calcifications in the abdomen including visceral arteries. 4. Increased prominence of the pulmonary trunk.  No aortic aneurysm. 5. Significantly improved findings of pancreatitis with only minimal peripancreatic stranding today. 6. Since 01/31/2021, interval CBD stenting and resolution of choledocholithiasis and biliary dilatation. 7. Cholelithiasis with mild gallbladder thickening and pericholecystic fluid which was seen on both prior studies and could be congestive. Correlate clinically for cholecystitis. 8. Renal atrophy and cysts with 1.3 cm probable hyperdense cyst left superior pole. MRI or ultrasound recommended to assess for internal complexity. 9. Possible cystitis. 10. Hip hardware obscuring visualization of the lower bladder, rectum and pelvic sidewalls. 11. Probable gastritis and additional findings discussed above. No intestinal dilatation or wall thickening. Diverticulosis. Electronically Signed   By: Telford Nab M.D.   On: 02/20/2021 22:20     Medications:    ceFEPime (MAXIPIME) IV     [START ON 02/23/2021] vancomycin      Chlorhexidine Gluconate Cloth  6 each Topical Daily   lidocaine  1 patch Transdermal Q24H      Assessment/ Plan:  [name] is a 75 y.o.  male with past medical history including hypotension, obesity, peripheral artery disease, diabetes, hyperlipidemia, and end-stage renal disease on dialysis.  Patient presents to emergency department with abdominal pain persisting for 2 days.  Patient admitted for further evaluation for NSTEMI, initial episode of care ([place]) [I21.4] Fever, unspecified fever cause [R50.9] Altered mental status, unspecified altered mental status type [E08.14] Acute metabolic encephalopathy [G81.85] Acute congestive heart failure, unspecified heart failure type ([place]) [I50.9]  [facility]/TTS/Lt AVF/96kg   End-stage renal disease on HD TTS.  No plan  for hemodialysis treatment  today.  We are planning for hemodialysis treatment again tomorrow.  2. Anemia of chronic kidney disease Lab Results  Component Value Date   HGB 10.3 (L) 02/21/2021    Hemoglobin currently 10.3.  Consider restarting Epogen with dialysis next treatment.  Serum phosphorus 4.5 and acceptable.  Continue to monitor.  3. Secondary Hyperparathyroidism: Lab Results  Component Value Date   CALCIUM 8.3 (L) 02/22/2021   PHOS 4.5 02/22/2021  Previously phosphorus well controlled at 3.8.  Continue to monitor periodically.  4.  Fever.  Pneumonia being treated.   LOS: 1 Shaylene Paganelli 12/19/202210:53 AM

## 2021-02-22 NOTE — Progress Notes (Signed)
Pharmacy Antibiotic Note  Antonio Phillips is a 75 y.o. male TThS HD pt admitted on 02/20/2021 with sepsis.  Pharmacy has been consulted for Cefepime dosing.  Afeb >24h, WBC WNL, procal 2.17>2.65>3.24  Bcx NGx2 days, MRSA PCR negative, Ucx sent  Plan: Cefepime --Will continue cefepime 1g IV q24h  Vancomycin --Given result of MRSA PCR, vancomycin was discontinued   Pharmacy will continue to follow and adjust abx dosing as warranted.   Height: 5\' 11"  (180.3 cm) Weight: 99.2 kg (218 lb 11.1 oz) IBW/kg (Calculated) : 75.3  Temp (24hrs), Avg:98.7 F (37.1 C), Min:98.4 F (36.9 C), Max:99.5 F (37.5 C)  Recent Labs  Lab 02/20/21 1602 02/20/21 2106 02/21/21 0023 02/21/21 0347  WBC 5.4  --   --  5.1  CREATININE 3.94*  --   --  4.56*  LATICACIDVEN  --  2.4* 1.9  --      Estimated Creatinine Clearance: 16.5 mL/min (A) (by C-G formula based on SCr of 4.56 mg/dL (H)).    Allergies  Allergen Reactions   Simvastatin Other (See Comments)    Antimicrobials this admission: Ongoing 12/17 Cefepime >>   Completed 12/18 Vancomycin >> 12/19 12/17 Flagyl x 1   Microbiology results: 12/18 MRSA PCR: not detected  12/17 BCx: NG x2 days 12/17 UCx: sent  Thank you for allowing pharmacy to be a part of this patients care.  Narda Rutherford, PharmD Pharmacy Resident  02/22/2021 11:36 AM

## 2021-02-22 NOTE — Progress Notes (Signed)
Progress Note  Patient Name: Antonio Phillips Date of Encounter: 02/22/2021  Primary Cardiologist: Rockey Situ  Subjective   The patient reports that he presented because of chills and shaking at home with no pain.  There was reported abdominal pain but the patient denies at the present time.  He does complain of sharp pain at the amputation site bilaterally.  He had a prolonged hospitalization last month for abdominal pain and gallstone pancreatitis status post ERCP and stenting to the CBD on 30/1 complicated by GNR bacteremia .  He had elevated troponin at that time thought to be due to supply demand ischemia.  Inpatient Medications    Scheduled Meds:  Chlorhexidine Gluconate Cloth  6 each Topical Daily   lidocaine  1 patch Transdermal Q24H   Continuous Infusions:  ceFEPime (MAXIPIME) IV     [START ON 02/23/2021] vancomycin     PRN Meds: docusate sodium, HYDROcodone-acetaminophen, HYDROmorphone (DILAUDID) injection, polyethylene glycol   Vital Signs    Vitals:   02/22/21 0700 02/22/21 0800 02/22/21 0900 02/22/21 1000  BP: 105/67 (!) 111/54 113/69 90/65  Pulse: 78 76 69 94  Resp: 11 11 10 15   Temp:  (!) 97.5 F (36.4 C)    TempSrc:  Oral    SpO2: 98% 99% 98% 100%  Weight:      Height:        Intake/Output Summary (Last 24 hours) at 02/22/2021 1056 Last data filed at 02/22/2021 0900 Gross per 24 hour  Intake 1080 ml  Output --  Net 1080 ml    Filed Weights   02/21/21 0141 02/22/21 0444  Weight: 97.3 kg 99.2 kg    Telemetry    Afib with ventricular rates in the 90s to low 100s bpm - Personally Reviewed  ECG    No new tracings - Personally Reviewed  Physical Exam   GEN: No acute distress.   Neck: JVD difficult to assess secondary to body habitus. Cardiac: IRIR, no murmurs, rubs, or gallops.  Respiratory: Diminished and coarse breath sounds, particularly along the bases bilaterally.  GI: Soft, nontender, non-distended.   MS: Trace to 1+ bilateral  lower extremity/stump edema; status post bilateral BKA. Neuro:  Alert and oriented x 3; Nonfocal.  Psych: Normal affect.  Labs    Chemistry Recent Labs  Lab 02/20/21 1602 02/21/21 0347 02/22/21 0349  NA 138 141 138  K 3.0* 3.2* 3.3*  CL 100 104 103  CO2 26 25 25   GLUCOSE 124* 93 134*  BUN 24* 27* 44*  CREATININE 3.94* 4.56* 5.51*  CALCIUM 9.4 8.7* 8.3*  PROT 6.8  --   --   ALBUMIN 3.1*  --   --   AST 18  --   --   ALT 9  --   --   ALKPHOS 150*  --   --   BILITOT 2.2*  --   --   GFRNONAA 15* 13* 10*  ANIONGAP 12 12 10       Hematology Recent Labs  Lab 02/20/21 1602 02/21/21 0347  WBC 5.4 5.1  RBC 3.76* 3.40*  HGB 11.3* 10.3*  HCT 36.7* 34.1*  MCV 97.6 100.3*  MCH 30.1 30.3  MCHC 30.8 30.2  RDW 20.5* 20.6*  PLT 68* 61*     Cardiac EnzymesNo results for input(s): TROPONINI in the last 168 hours. No results for input(s): TROPIPOC in the last 168 hours.   BNP Recent Labs  Lab 02/20/21 2123  BNP >4,500.0*      DDimer  No results for input(s): DDIMER in the last 168 hours.   Radiology    DG Chest 2 View  Result Date: 02/20/2021 CLINICAL DATA:  Weakness EXAM: CHEST - 2 VIEW COMPARISON:  Radiograph 02/05/2021 FINDINGS: Unchanged enlarged cardiac silhouette. There are bibasilar airspace opacities. Mild diffuse interstitial opacities. Trace pleural effusions may be present. No visible pneumothorax. No acute osseous abnormality. Thoracic spondylosis. IMPRESSION: Cardiomegaly with interstitial pulmonary edema and bibasilar opacities which could be alveolar edema, atelectasis, or infection. Electronically Signed   By: Maurine Simmering M.D.   On: 02/20/2021 15:28   CT Head Wo Contrast  Result Date: 02/20/2021 CLINICAL DATA:  Altered mental status EXAM: CT HEAD WITHOUT CONTRAST TECHNIQUE: Contiguous axial images were obtained from the base of the skull through the vertex without intravenous contrast. COMPARISON:  12/03/2009 FINDINGS: Brain: Normal anatomic  configuration. Parenchymal volume loss is commensurate with the patient's age, though progressive since prior examination. Mild periventricular white matter changes are present likely reflecting the sequela of small vessel ischemia. Physiologic calcification within the globus pallidi again noted. No abnormal intra or extra-axial mass lesion or fluid collection. No abnormal mass effect or midline shift. No evidence of acute intracranial hemorrhage or infarct. Ventricular size is normal. Cerebellum unremarkable. Vascular: No asymmetric hyperdense vasculature at the skull base. Advanced calcifications are noted within the carotid siphons and distal vertebral arteries bilaterally. Skull: Intact Sinuses/Orbits: Mild mucosal thickening within the frontal sinuses. Remaining paranasal sinuses are clear. Ocular lenses have been removed. Orbits are otherwise unremarkable. Other: There is fluid opacification of the mastoid air cells bilaterally without osseous erosion. Fluid debris is also noted within the left middle ear cavity. IMPRESSION: No acute intracranial abnormality. Progressive senescent change. Mild bifrontal paranasal sinus disease. Bilateral mastoid effusions. Fluid within the left middle ear cavity. Clinical correlation for signs and symptoms of otomastoiditis may be helpful. Electronically Signed   By: Fidela Salisbury M.D.   On: 02/20/2021 21:59   CT CHEST ABDOMEN PELVIS WO CONTRAST  Result Date: 02/20/2021 CLINICAL DATA:  Suspected complicated pneumonia, abdominal discomfort also. End-stage renal failure patient on dialysis. EXAM: CT CHEST, ABDOMEN AND PELVIS WITHOUT CONTRAST TECHNIQUE: Multidetector CT imaging of the chest, abdomen and pelvis was performed following the standard protocol without IV contrast. COMPARISON:  Chest, abdomen and pelvis CT no contrast 01/31/2021, CT angiography chest 09/20/2020. FINDINGS: CT CHEST FINDINGS Cardiovascular: Moderate panchamber cardiomegaly with small stable  pericardial effusion. Three-vessel heavy calcific CAD. Increasingly prominent pulmonary trunk measuring 3.8 cm indicating arterial hypertension. Patchy aortic atherosclerosis with tortuosity also noted without aneurysm. Mildly distended superior pulmonary veins. Mediastinum/Nodes: Old right hemithyroidectomy with unremarkable left lobe. Stable small subcentimeter scattered mediastinal nodes. No bulky or encasing adenopathy. Axillary spaces are clear. Subareolar gynecomastia. Lungs/Pleura: Small to moderate-sized increased layering pleural effusions. Slight increased subpleural septal lines in the bases consistent with interstitial edema. There is consolidation or atelectasis in the posterior lower lobes alongside the effusions, more simple compressive atelectasis posteriorly in the upper lobes. No filling defect in the trachea and central airways. Musculoskeletal: Mild but increased body wall anasarca. 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 Osteopenia and slight thoracic dextroscoliosis. No worrisome thoracic bone lesion. CT ABDOMEN PELVIS FINDINGS Hepatobiliary: No liver mass is seen without contrast. There are stones in the gallbladder, and mild gallbladder thickening or trace pericholecystic fluid but this was seen on both of the prior studies it could simply be congestive. The common bile duct has been stented since the prior study with the distal stent just inside  the descending duodenum and interval common bile duct decompression. Pancreas: There is slight peripancreatic stranding, but significantly improved compared to the prior study. 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 Spleen: Stable mild enlargement measuring 15.4 cm AP, with vascular calcifications in the splenic substance. Adrenals/Urinary Tract: No adrenal mass. Atrophic kidneys with bilateral cysts. 1.3 cm probable hyperdense cyst in the  superior pole left kidney of 52 Hounsfield units. Consider follow-up ultrasound or MRI to assess for internal complexity. Other cysts are low in density. There are heavy renal vascular calcifications. No hydronephrosis or stone. The bladder mostly obscured by bilateral hip replacements. The dome is mildly thickened as before. Stomach/Bowel: Increased thickened folds in the stomach. Probable gastritis. No bowel wall thickening or dilatation including the appendix with sigmoid diverticulosis again seen. There is a surgically widened small bowel segment in the anterior mid abdomen which is unchanged. The rectum is obscured by the hip hardware. Vascular/Lymphatic: Extensive aortoiliac and visceral arterial calcific plaques. No AAA. Multiple subcentimeter up to borderline size retroperitoneal nodes are redemonstrated unchanged. Assessment for pelvic adenopathy very limited due to the hip hardware but no obvious mass. Reproductive: Obscured prostate, unable to evaluate due to the hip hardware. Other: Minimal ascites again noted in the abdomen and pelvis, but increased compared to the prior study with increased body wall anasarca. Small umbilical and inguinal fat hernias. Musculoskeletal: Bilateral hip replacements. Osteopenia and advanced degenerative change of the spine with multilevel bridging osteophytes. Ankylosis right SI joint. 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 IMPRESSION: 1. Mild features of CHF, with increased small to moderate bilateral layering pleural effusions and with consolidation or atelectasis in the adjacent lower lobes. No other focal pulmonary abnormality. 2. Increased body wall anasarca and small-volume abdominal and pelvic ascites. 3. Cardiomegaly with aortic and coronary artery atherosclerosis, extensive vascular calcifications in the abdomen including visceral arteries. 4. Increased prominence of the pulmonary trunk.  No aortic aneurysm. 5.  Significantly improved findings of pancreatitis with only minimal peripancreatic stranding today. 6. Since 01/31/2021, interval CBD stenting and resolution of choledocholithiasis and biliary dilatation. 7. Cholelithiasis with mild gallbladder thickening and pericholecystic fluid which was seen on both prior studies and could be congestive. Correlate clinically for cholecystitis. 8. Renal atrophy and cysts with 1.3 cm probable hyperdense cyst left superior pole. MRI or ultrasound recommended to assess for internal complexity. 9. Possible cystitis. 10. Hip hardware obscuring visualization of the lower bladder, rectum and pelvic sidewalls. 11. Probable gastritis and additional findings discussed above. No intestinal dilatation or wall thickening. Diverticulosis. Electronically Signed   By: Telford Nab M.D.   On: 02/20/2021 22:20    Cardiac Studies   Limited echo 09/2020: 1. Left ventricular ejection fraction, by estimation, is 45 to 50%. The  left ventricle has mildly decreased function. Left ventricular endocardial  border not optimally defined to evaluate regional wall motion. There is  mild left ventricular hypertrophy.   2. Right ventricular systolic function is moderately reduced. The right  ventricular size is normal. Mildly increased right ventricular wall  thickness. There is moderately elevated pulmonary artery systolic  pressure.   3. The mitral valve is abnormal. Trivial mitral valve regurgitation.   4. The aortic valve is tricuspid. There is mild thickening of the aortic  valve.   5. The inferior vena cava is dilated in size with <50% respiratory  variability, suggesting right atrial pressure of 15 mmHg.  __________  Limited echo 09/2020: 1. Poor acoustic windows Endocardium is not well seen I would recomm  limited echo with Definity to help define LVEF and regional wall motion. .  There is mild left ventricular hypertrophy. Left ventricular diastolic  parameters are indeterminate.    2. RV is not seen well enough to evaluate function. . The right  ventricular size is mildly enlarged.   3. Left atrial size was severely dilated.   4. Right atrial size was severely dilated.   5. Mild mitral valve regurgitation.   6. Tricuspid valve regurgitation is mild to moderate.   7. The aortic valve is tricuspid. Aortic valve regurgitation is not  visualized. Mild aortic valve sclerosis is present, with no evidence of  aortic valve stenosis. __________  LHC 09/2020:   Ost LAD to Prox LAD lesion is 50% stenosed.   2nd Mrg lesion is 99% stenosed.   A drug-eluting stent was successfully placed using a STENT RESOLUTE ONYX 2.5X15.   Post intervention, there is a 30% residual stenosis.   1.  Severe single-vessel coronary artery disease with 99% subtotal stenosis of the second obtuse marginal branch, treated with a 2.5 x 15 mm resolute Onyx DES.  30% residual stenosis at the case completion due to resistant/calcified lesion type even with high-pressure noncompliant balloon angioplasty. 2.  Mild to moderate nonobstructive proximal LAD stenosis 3.  Mild nonobstructive RCA stenosis  Recommendations: Aggressive medical therapy, as long as no bleeding complications arise, would resume apixaban tomorrow, aspirin 81 mg daily x30 days, and clopidogrel 75 mg daily x minimum 6 months. __________  2D echo 09/2020: 1. Left ventricular ejection fraction, by estimation, is 60 to 65%. The  left ventricle has normal function. The left ventricle has no regional  wall motion abnormalities. Left ventricular diastolic parameters are  indeterminate.   2. Right ventricular systolic function is normal. The right ventricular  size is normal. Tricuspid regurgitation signal is inadequate for assessing  PA pressure.   3. Left atrial size was moderately dilated.   4. Right atrial size was moderately dilated.   5. Tricuspid valve regurgitation is mild to moderate.   6. The mitral valve is normal in structure.  Mild mitral valve  regurgitation.   7. Rhythym is atrial fibrillation   Patient Profile     75 y.o. 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 fib on Eliquis, ESRD on HD, hypotension, DM2, anemia of chronic disease, morbid obesity presented with low-grade fever and chills with mental status changes and reported abdominal pain.  He was found to have elevated troponin.  Assessment & Plan    1.  CAD involving the native coronary arteries with demand ischemia: -No angina  -Elevated troponin to 2400 without ischemic symptoms.  Suspect again this is due to supply demand ischemia.  No plans for ischemic cardiac evaluation but will obtain an echocardiogram to ensure stability of ejection fraction and wall motion.   -Given the patient's continued issues with thrombocytopenia, no need to resume clopidogrel as his PCI was close to 6 months ago. Continue treatment with a statin.-  2.  Permanent A. fib: -Ventricular rates reasonably controlled  -Consider resuming small dose metoprolol 12.5 mg twice daily for rate control and anticoagulation with Eliquis 5 mg twice daily.  3.  HFmrEF: -Volume up -EF 45 to 50% by echo in 09/2020 -I requested a repeat echocardiogram.  4.  Recent gallstone pancreatitis: -Status post  ERCP with stenting   5.  ESRD: -HD per nephrology  6.  Possible bacteremia: Blood culture so far is negative but his symptoms are suggestive of bacteremia.   For questions or updates, please contact [facility] Please consult [email or web address] for contact info under Cardiology/STEMI.    Signed, Kathlyn Sacramento, MD [facility] 02/22/2021, 10:56 AM

## 2021-02-23 DIAGNOSIS — I4811 Longstanding persistent atrial fibrillation: Secondary | ICD-10-CM | POA: Diagnosis not present

## 2021-02-23 DIAGNOSIS — N186 End stage renal disease: Secondary | ICD-10-CM | POA: Diagnosis not present

## 2021-02-23 DIAGNOSIS — G9341 Metabolic encephalopathy: Secondary | ICD-10-CM | POA: Diagnosis not present

## 2021-02-23 DIAGNOSIS — I214 Non-ST elevation (NSTEMI) myocardial infarction: Secondary | ICD-10-CM | POA: Diagnosis not present

## 2021-02-23 LAB — RENAL FUNCTION PANEL
Albumin: 2.3 g/dL — ABNORMAL LOW (ref 3.5–5.0)
Anion gap: 13 (ref 5–15)
BUN: 57 mg/dL — ABNORMAL HIGH (ref 8–23)
CO2: 22 mmol/L (ref 22–32)
Calcium: 8.3 mg/dL — ABNORMAL LOW (ref 8.9–10.3)
Chloride: 102 mmol/L (ref 98–111)
Creatinine, Ser: 6.33 mg/dL — ABNORMAL HIGH (ref 0.61–1.24)
GFR, Estimated: 9 mL/min — ABNORMAL LOW (ref >60)
Glucose, Bld: 118 mg/dL — ABNORMAL HIGH (ref 70–99)
Phosphorus: 5.6 mg/dL — ABNORMAL HIGH (ref 2.5–4.6)
Potassium: 4.1 mmol/L (ref 3.5–5.1)
Sodium: 137 mmol/L (ref 135–145)

## 2021-02-23 LAB — CBC
HCT: 28.2 % — ABNORMAL LOW (ref 39.0–52.0)
Hemoglobin: 8.7 g/dL — ABNORMAL LOW (ref 13.0–17.0)
MCH: 30.5 pg (ref 26.0–34.0)
MCHC: 30.9 g/dL (ref 30.0–36.0)
MCV: 98.9 fL (ref 80.0–100.0)
Platelets: 55 10*3/uL — ABNORMAL LOW (ref 150–400)
RBC: 2.85 MIL/uL — ABNORMAL LOW (ref 4.22–5.81)
RDW: 20.3 % — ABNORMAL HIGH (ref 11.5–15.5)
WBC: 4.8 10*3/uL (ref 4.0–10.5)
nRBC: 0 % (ref 0.0–0.2)

## 2021-02-23 LAB — MAGNESIUM: Magnesium: 1.8 mg/dL (ref 1.7–2.4)

## 2021-02-23 MED ORDER — FINASTERIDE 5 MG PO TABS
5.0000 mg | ORAL_TABLET | Freq: Every day | ORAL | Status: DC
  Administered 2021-02-23 – 2021-03-03 (×9): 5 mg via ORAL
  Filled 2021-02-23 (×9): qty 1

## 2021-02-23 MED ORDER — METOPROLOL TARTRATE 25 MG PO TABS
12.5000 mg | ORAL_TABLET | Freq: Two times a day (BID) | ORAL | Status: DC
  Administered 2021-02-23 – 2021-03-03 (×14): 12.5 mg via ORAL
  Filled 2021-02-23 (×15): qty 1

## 2021-02-23 MED ORDER — VITAMIN B-12 1000 MCG PO TABS
1000.0000 ug | ORAL_TABLET | Freq: Every day | ORAL | Status: DC
  Administered 2021-02-24 – 2021-03-03 (×8): 1000 ug via ORAL
  Filled 2021-02-23 (×8): qty 1

## 2021-02-23 MED ORDER — CLOPIDOGREL BISULFATE 75 MG PO TABS: 75.0000 mg | ORAL_TABLET | Freq: Every day | ORAL | Status: DC

## 2021-02-23 MED ORDER — APIXABAN 5 MG PO TABS
5.0000 mg | ORAL_TABLET | Freq: Two times a day (BID) | ORAL | Status: DC
  Administered 2021-02-23 – 2021-02-26 (×6): 5 mg via ORAL
  Filled 2021-02-23 (×6): qty 1

## 2021-02-23 MED ORDER — MIDODRINE HCL 5 MG PO TABS
10.0000 mg | ORAL_TABLET | Freq: Three times a day (TID) | ORAL | Status: DC
  Administered 2021-02-23 – 2021-03-03 (×20): 10 mg via ORAL
  Filled 2021-02-23 (×20): qty 2

## 2021-02-23 MED ORDER — SEVELAMER CARBONATE 800 MG PO TABS: 1600.0000 mg | ORAL_TABLET | ORAL | Status: DC

## 2021-02-23 MED ORDER — ROPINIROLE HCL 1 MG PO TABS
1.0000 mg | ORAL_TABLET | Freq: Every day | ORAL | Status: DC
  Administered 2021-02-23 – 2021-03-02 (×7): 1 mg via ORAL
  Filled 2021-02-23 (×8): qty 1

## 2021-02-23 MED ORDER — SEVELAMER CARBONATE 800 MG PO TABS: 800.0000 mg | ORAL_TABLET | ORAL | Status: DC

## 2021-02-23 MED ORDER — SEVELAMER CARBONATE 800 MG PO TABS
1600.0000 mg | ORAL_TABLET | Freq: Two times a day (BID) | ORAL | Status: DC
  Administered 2021-02-24 – 2021-03-03 (×12): 1600 mg via ORAL
  Filled 2021-02-23 (×12): qty 2

## 2021-02-23 MED ORDER — ATORVASTATIN CALCIUM 20 MG PO TABS
40.0000 mg | ORAL_TABLET | Freq: Every day | ORAL | Status: DC
  Administered 2021-02-23 – 2021-03-02 (×7): 40 mg via ORAL
  Filled 2021-02-23 (×7): qty 2

## 2021-02-23 MED ORDER — SODIUM CHLORIDE 0.9% FLUSH
10.0000 mL | Freq: Two times a day (BID) | INTRAVENOUS | Status: DC
  Administered 2021-02-23 – 2021-03-03 (×15): 10 mL via INTRAVENOUS

## 2021-02-23 MED ORDER — EPOETIN ALFA 10000 UNIT/ML IJ SOLN
6000.0000 [IU] | INTRAMUSCULAR | Status: DC
  Administered 2021-02-23 – 2021-03-02 (×4): 6000 [IU] via INTRAVENOUS
  Filled 2021-02-23 (×3): qty 0.6

## 2021-02-23 MED ORDER — SEVELAMER CARBONATE 800 MG PO TABS
1600.0000 mg | ORAL_TABLET | ORAL | Status: DC
  Administered 2021-02-24 – 2021-02-26 (×2): 1600 mg via ORAL
  Filled 2021-02-23 (×3): qty 2

## 2021-02-23 NOTE — Progress Notes (Deleted)
Pharmacy Antibiotic Note  RENA SWEEDEN is a 75 y.o. male [facility] HD pt admitted on 02/20/2021 with sepsis.  Pharmacy has been consulted for Cefepime dosing.  Afeb >48h, WBC WNL, procal 2.17>2.65>3.24  Bcx NG x3 days, MRSA PCR negative, Ucx sent  Plan: Cefepime --Will continue cefepime 1g IV q24h  Pharmacy will continue to follow and adjust abx dosing as warranted.   Height: 5\' 11"  (180.3 cm) Weight: 99.2 kg (218 lb 11.1 oz) IBW/kg (Calculated) : 75.3  Temp (24hrs), Avg:98 F (36.7 C), Min:97.3 F (36.3 C), Max:98.3 F (36.8 C)  Recent Labs  Lab 02/20/21 1602 02/20/21 2106 02/21/21 0023 02/21/21 0347 02/22/21 0349 02/23/21 0556  WBC 5.4  --   --  5.1  --  4.8  CREATININE 3.94*  --   --  4.56* 5.51* 6.33*  LATICACIDVEN  --  2.4* 1.9  --   --   --      Estimated Creatinine Clearance: 11.9 mL/min (A) (by C-G formula based on SCr of 6.33 mg/dL (H)).    Allergies  Allergen Reactions   Simvastatin Other (See Comments)    Antimicrobials this admission: Ongoing 12/17 Cefepime >>   Completed 12/18 Vancomycin >> 12/19 12/17 Flagyl x 1   Microbiology results: 12/18 MRSA PCR: not detected  12/17 BCx: NG x3 days 12/17 UCx: sent  Thank you for allowing pharmacy to be a part of this patients care.  Narda Rutherford, PharmD Pharmacy Resident  02/23/2021 11:25 AM

## 2021-02-23 NOTE — Consult Note (Signed)
WOC Nurse Consult Note: Contact by Bedside RN as 1/4 inch plain gauze packing strip is not available. Order provided by my associate D. Barbie Haggis is amended for use of 1/4 inch iodoform gauze packing strip to be used. Kellie Simmering # [phone])  [facility] nursing team will not follow, but will remain available to this patient, the nursing and medical teams.  Please re-consult if needed. Thanks, Maudie Flakes, MSN, RN, [place], Arther Abbott  Pager# [phone]

## 2021-02-23 NOTE — Progress Notes (Signed)
[facility]  ROUNDING NOTE   Subjective:   Antonio Phillips is a 75 year old male with past medical history including hypotension, obesity, peripheral artery disease, diabetes, hyperlipidemia, and end-stage renal disease on dialysis.  Patient presents to emergency department with abdominal pain persisting for 2 days.  Patient admitted for further evaluation for NSTEMI, initial episode of care [facility]) [I21.4] Fever, unspecified fever cause [R50.9] Altered mental status, unspecified altered mental status type [Q94.76] Acute metabolic encephalopathy [L46.50] Acute congestive heart failure, unspecified heart failure type ([place]) [I50.9]  Patient resting in bed comfortably at the moment. Due for hemodialysis treatment today.    Objective:  Vital signs in last 24 hours:  Temp:  [97.3 F (36.3 C)-98.3 F (36.8 C)] 97.3 F (36.3 C) (12/20 0745) Pulse Rate:  [69-102] 81 (12/20 0745) Resp:  [6-18] 16 (12/20 0745) BP: (90-121)/(54-69) 104/67 (12/20 0400) SpO2:  [78 %-100 %] 100 % (12/20 0745) Weight:  [99.2 kg] 99.2 kg (12/20 0408)  Weight change: 0 kg Filed Weights   02/21/21 0141 02/22/21 0444 02/23/21 0408  Weight: 97.3 kg 99.2 kg 99.2 kg    Intake/Output: I/O last 3 completed shifts: In: 40 [P.O.:720; IV Piggyback:100] Out: 0    Intake/Output this shift:  No intake/output data recorded.  Physical Exam: General: NAD, resting comfortably  Head: Normocephalic, atraumatic. Moist oral mucosal membranes  Eyes: Anicteric  Lungs:  Basilar rales, normal effort  Heart: Irregular  Abdomen:  Soft, tender, mild distention  Extremities: Trace peripheral edema.  Right BKA left AKA  Neurologic: Nonfocal, moving all four extremities  Skin: No lesions  Access: Left AVF    Basic Metabolic Panel: Recent Labs  Lab 02/20/21 1602 02/21/21 0347 02/22/21 0349 02/23/21 0556  NA 138 141 138 137  K 3.0* 3.2* 3.3* 4.1  CL 100 104 103 102  CO2 26 25 25 22   GLUCOSE 124* 93  134* 118*  BUN 24* 27* 44* 57*  CREATININE 3.94* 4.56* 5.51* 6.33*  CALCIUM 9.4 8.7* 8.3* 8.3*  MG  --  1.9 1.8 1.8  PHOS  --  3.8 4.5 5.6*     Liver Function Tests: Recent Labs  Lab 02/20/21 1602 02/23/21 0556  AST 18  --   ALT 9  --   ALKPHOS 150*  --   BILITOT 2.2*  --   PROT 6.8  --   ALBUMIN 3.1* 2.3*    No results for input(s): LIPASE, AMYLASE in the last 168 hours.  Recent Labs  Lab 02/21/21 0042  AMMONIA 22     CBC: Recent Labs  Lab 02/20/21 1602 02/21/21 0347 02/23/21 0556  WBC 5.4 5.1 4.8  NEUTROABS 4.0  --   --   HGB 11.3* 10.3* 8.7*  HCT 36.7* 34.1* 28.2*  MCV 97.6 100.3* 98.9  PLT 68* 61* 55*     Cardiac Enzymes: No results for input(s): CKTOTAL, CKMB, CKMBINDEX, TROPONINI in the last 168 hours.  BNP: Invalid input(s): POCBNP  CBG: Recent Labs  Lab 02/21/21 0115  GLUCAP 105*     Microbiology: Results for orders placed or performed during the hospital encounter of 02/20/21  Resp Panel by RT-PCR (Flu A&B, Covid) Nasopharyngeal Swab     Status: None   Collection Time: 02/20/21  2:49 PM   Specimen: Nasopharyngeal Swab; Nasopharyngeal(NP) swabs in vial transport medium  Result Value Ref Range Status   SARS Coronavirus 2 by RT PCR NEGATIVE NEGATIVE Final    Comment: (NOTE) SARS-CoV-2 target nucleic acids are NOT DETECTED.  The  SARS-CoV-2 RNA is generally detectable in upper respiratory specimens during the acute phase of infection. The lowest concentration of SARS-CoV-2 viral copies this assay can detect is 138 copies/mL. A negative result does not preclude SARS-Cov-2 infection and should not be used as the sole basis for treatment or other patient management decisions. A negative result may occur with  improper specimen collection/handling, submission of specimen other than nasopharyngeal swab, presence of viral mutation(s) within the areas targeted by this assay, and inadequate number of viral copies(<138 copies/mL). A negative  result must be combined with clinical observations, patient history, and epidemiological information. The expected result is Negative.  Fact Sheet for Patients:  EntrepreneurPulse.com.au  Fact Sheet for Healthcare Providers:  IncredibleEmployment.be  This test is no t yet approved or cleared by the [place] FDA and  has been authorized for detection and/or diagnosis of SARS-CoV-2 by FDA under an Emergency Use Authorization (EUA). This EUA will remain  in effect (meaning this test can be used) for the duration of the COVID-19 declaration under Section 564(b)(1) of the Act, 21 U.S.C.section 360bbb-3(b)(1), unless the authorization is terminated  or revoked sooner.       Influenza A by PCR NEGATIVE NEGATIVE Final   Influenza B by PCR NEGATIVE NEGATIVE Final    Comment: (NOTE) The Xpert Xpress SARS-CoV-2/FLU/RSV plus assay is intended as an aid in the diagnosis of influenza from Nasopharyngeal swab specimens and should not be used as a sole basis for treatment. Nasal washings and aspirates are unacceptable for Xpert Xpress SARS-CoV-2/FLU/RSV testing.  Fact Sheet for Patients: EntrepreneurPulse.com.au  Fact Sheet for Healthcare Providers: IncredibleEmployment.be  This test is not yet approved or cleared by the [place] FDA and has been authorized for detection and/or diagnosis of SARS-CoV-2 by FDA under an Emergency Use Authorization (EUA). This EUA will remain in effect (meaning this test can be used) for the duration of the COVID-19 declaration under Section 564(b)(1) of the Act, 21 U.S.C. section 360bbb-3(b)(1), unless the authorization is terminated or revoked.  Performed at [facility], [address] [place], [place] [address]   Culture, blood (single)     Status: None (Preliminary result)   Collection Time: 02/20/21  9:05 PM   Specimen: BLOOD  Result Value Ref Range Status    Specimen Description BLOOD BLOOD RIGHT FOREARM  Final   Special Requests   Final    BOTTLES DRAWN AEROBIC AND ANAEROBIC Blood Culture results may not be optimal due to an excessive volume of blood received in culture bottles   Culture   Final    NO GROWTH 3 DAYS Performed at [facility], [address] [place], [place] [address]    Report Status PENDING  Incomplete  MRSA Next Gen by PCR, Nasal     Status: None   Collection Time: 02/21/21  7:39 AM   Specimen: Nasal Mucosa; Nasal Swab  Result Value Ref Range Status   MRSA by PCR Next Gen NOT DETECTED NOT DETECTED Final    Comment: (NOTE) The GeneXpert MRSA Assay (FDA approved for NASAL specimens only), is one component of a comprehensive MRSA colonization surveillance program. It is not intended to diagnose MRSA infection nor to guide or monitor treatment for MRSA infections. Test performance is not FDA approved in patients less than 54 years old. Performed at [facility] [address], [place], [place] [address]     Coagulation Studies: Recent Labs    02/21/21 0023  LABPROT 26.2*  INR 2.4*  Urinalysis: No results for input(s): COLORURINE, LABSPEC, PHURINE, GLUCOSEU, HGBUR, BILIRUBINUR, KETONESUR, PROTEINUR, UROBILINOGEN, NITRITE, LEUKOCYTESUR in the last 72 hours.  Invalid input(s): APPERANCEUR    Imaging: ECHOCARDIOGRAM COMPLETE  Result Date: 02/22/2021    ECHOCARDIOGRAM REPORT   Patient Name:   LEOVANNI BJORKMAN [facility] Date of Exam: 02/22/2021 Medical Rec #:  [id number]          Height:       71.0 in Accession #:    [id number]         Weight:       218.7 lb Date of Birth:  [birth year]          BSA:          2.190 m Patient Age:    27 years           BP:           105/68 mmHg Patient Gender: M                  HR:           108 bpm. Exam Location:  [facility] Procedure: 2D Echo, Color Doppler, Cardiac Doppler and Intracardiac            Opacification Agent Indications:     Elevated troponin  History:          Patient has prior history of Echocardiogram examinations, most                  recent 09/28/2020. ESRD and PAD, Arrythmias:Atrial Fibrillation;                  Risk Factors:Diabetes and Dyslipidemia.  Sonographer:     Charmayne Sheer Referring Phys:  [id number] XFGHWEXH A ARIDA Diagnosing Phys: Kathlyn Sacramento MD  Sonographer Comments: Suboptimal apical window and suboptimal subcostal window. Image acquisition challenging due to patient body habitus. IMPRESSIONS  1. Left ventricular ejection fraction, by estimation, is 40 to 45%. The left ventricle has mildly decreased function. Left ventricular endocardial border not optimally defined to evaluate regional wall motion. Left ventricular diastolic parameters are indeterminate.  2. Right ventricular systolic function is normal. The right ventricular size is normal. Tricuspid regurgitation signal is inadequate for assessing PA pressure.  3. Left atrial size was mildly dilated.  4. A small pericardial effusion is present. The pericardial effusion is posterior to the left ventricle. Moderate pleural effusion in the left lateral region.  5. The mitral valve is normal in structure. Mild mitral valve regurgitation. No evidence of mitral stenosis.  6. The aortic valve is normal in structure. Aortic valve regurgitation is not visualized. Aortic valve sclerosis/calcification is present, without any evidence of aortic stenosis.  7. Moderately dilated pulmonary artery.  8. The inferior vena cava is dilated in size with <50% respiratory variability, suggesting right atrial pressure of 15 mmHg. FINDINGS  Left Ventricle: Left ventricular ejection fraction, by estimation, is 40 to 45%. The left ventricle has mildly decreased function. Left ventricular endocardial border not optimally defined to evaluate regional wall motion. Definity contrast agent was given IV to delineate the left ventricular endocardial borders. The left ventricular internal cavity size was normal in size. There is no left  ventricular hypertrophy. Left ventricular diastolic parameters are indeterminate. Right Ventricle: The right ventricular size is normal. No increase in right ventricular wall thickness. Right ventricular systolic function is normal. Tricuspid regurgitation signal is inadequate for assessing PA pressure. Left Atrium: Left atrial size was mildly dilated. Right Atrium: Right atrial size  was normal in size. Pericardium: A small pericardial effusion is present. The pericardial effusion is posterior to the left ventricle. Mitral Valve: The mitral valve is normal in structure. Mild mitral annular calcification. Mild mitral valve regurgitation. No evidence of mitral valve stenosis. MV peak gradient, 5.4 mmHg. The mean mitral valve gradient is 2.0 mmHg. Tricuspid Valve: The tricuspid valve is normal in structure. Tricuspid valve regurgitation is not demonstrated. No evidence of tricuspid stenosis. Aortic Valve: The aortic valve is normal in structure. Aortic valve regurgitation is not visualized. Aortic valve sclerosis/calcification is present, without any evidence of aortic stenosis. Aortic valve mean gradient measures 9.0 mmHg. Aortic valve peak  gradient measures 17.6 mmHg. Aortic valve area, by VTI measures 1.56 cm. Pulmonic Valve: The pulmonic valve was normal in structure. Pulmonic valve regurgitation is mild. No evidence of pulmonic stenosis. Aorta: The aortic root is normal in size and structure. Pulmonary Artery: The pulmonary artery is moderately dilated. Venous: The inferior vena cava is dilated in size with less than 50% respiratory variability, suggesting right atrial pressure of 15 mmHg. IAS/Shunts: No atrial level shunt detected by color flow Doppler. Additional Comments: There is a moderate pleural effusion in the left lateral region.  LEFT VENTRICLE PLAX 2D LVIDd:         5.03 cm   Diastology LVIDs:         3.64 cm   LV e' medial:    6.96 cm/s LV PW:         0.91 cm   LV E/e' medial:  13.1 LV IVS:         0.92 cm   LV e' lateral:   7.29 cm/s LVOT diam:     2.10 cm   LV E/e' lateral: 12.5 LV SV:         53 LV SV Index:   24 LVOT Area:     3.46 cm  RIGHT VENTRICLE RV Basal diam:  3.11 cm LEFT ATRIUM         Index       RIGHT ATRIUM           Index LA diam:    4.30 cm 1.96 cm/m  RA Area:     19.90 cm                                 RA Volume:   49.90 ml  22.78 ml/m  AORTIC VALVE                     PULMONIC VALVE AV Area (Vmax):    1.46 cm      PV Vmax:          1.09 m/s AV Area (Vmean):   1.44 cm      PV Vmean:         69.800 cm/s AV Area (VTI):     1.56 cm      PV VTI:           0.190 m AV Vmax:           210.00 cm/s   PV Peak grad:     4.8 mmHg AV Vmean:          140.000 cm/s  PV Mean grad:     2.0 mmHg AV VTI:            0.343 m       PR End Diast Vel:  3.07 msec AV Peak Grad:      17.6 mmHg AV Mean Grad:      9.0 mmHg LVOT Vmax:         88.80 cm/s LVOT Vmean:        58.400 cm/s LVOT VTI:          0.154 m LVOT/AV VTI ratio: 0.45  AORTA Ao Root diam: 3.30 cm MITRAL VALVE MV Area (PHT): 5.68 cm    SHUNTS MV Area VTI:   2.42 cm    Systemic VTI:  0.15 m MV Peak grad:  5.4 mmHg    Systemic Diam: 2.10 cm MV Mean grad:  2.0 mmHg MV Vmax:       1.16 m/s MV Vmean:      54.8 cm/s MV Decel Time: 134 msec MV E velocity: 90.83 cm/s Kathlyn Sacramento MD Electronically signed by Kathlyn Sacramento MD Signature Date/Time: 02/22/2021/3:32:30 PM    Final      Medications:    ceFEPime (MAXIPIME) IV Stopped (02/23/21 0550)    apixaban  5 mg Oral BID   Chlorhexidine Gluconate Cloth  6 each Topical Daily   clopidogrel  75 mg Oral Daily   feeding supplement (NEPRO CARB STEADY)  237 mL Oral TID BM   lidocaine  1 patch Transdermal Q24H   multivitamin  1 tablet Oral QHS      Assessment/ Plan:  [name] is a 75 y.o.  male with past medical history including hypotension, obesity, peripheral artery disease, diabetes, hyperlipidemia, and end-stage renal disease on dialysis.  Patient presents to emergency  department with abdominal pain persisting for 2 days.  Patient admitted for further evaluation for NSTEMI, initial episode of care ([place]) [I21.4] Fever, unspecified fever cause [R50.9] Altered mental status, unspecified altered mental status type [E99.37] Acute metabolic encephalopathy [J69.67] Acute congestive heart failure, unspecified heart failure type ([place]) [I50.9]  [facility] N /TTS/Lt AVF/96kg   End-stage renal disease on HD TTS.  Patient due for hemodialysis treatment today.  Orders to be prepared.  2. Anemia of chronic kidney disease Lab Results  Component Value Date   HGB 8.7 (L) 02/23/2021    Hemoglobin down to 8.7.  Start the patient on Epogen 6000 IV with dialysis.  3. Secondary Hyperparathyroidism: Lab Results  Component Value Date   CALCIUM 8.3 (L) 02/23/2021   PHOS 5.6 (H) 02/23/2021  Phosphorus slightly higher today at 5.6.  Should come down with dialysis treatment.  4.  Fever.  Pneumonia being treated.  Currently on cefepime.   LOS: 2 Antha Niday 12/20/20227:48 AM

## 2021-02-23 NOTE — Progress Notes (Signed)
Progress Note  Patient Name: Antonio Phillips Date of Encounter: 02/23/2021  Primary Cardiologist: Rockey Situ  Subjective   He reports improvement in symptoms overall with less shortness of breath.  No chest pain.  He seems to be more alert.  Inpatient Medications    Scheduled Meds:  apixaban  5 mg Oral BID   atorvastatin  40 mg Oral QHS   Chlorhexidine Gluconate Cloth  6 each Topical Daily   clopidogrel  75 mg Oral Daily   epoetin (EPOGEN/PROCRIT) injection  6,000 Units Intravenous Q T,Th,Sa-HD   feeding supplement (NEPRO CARB STEADY)  237 mL Oral TID BM   finasteride  5 mg Oral Daily   lidocaine  1 patch Transdermal Q24H   metoprolol tartrate  12.5 mg Oral BID   midodrine  10 mg Oral TID WC   multivitamin  1 tablet Oral QHS   rOPINIRole  1 mg Oral QHS   [START ON 02/24/2021] sevelamer carbonate  1,600 mg Oral Once per day on Sun Mon Wed Fri   [START ON 02/24/2021] sevelamer carbonate  1,600 mg Oral BID WC   vitamin B-12  1,000 mcg Oral Daily   Continuous Infusions:   PRN Meds: docusate sodium, HYDROcodone-acetaminophen, HYDROmorphone (DILAUDID) injection, polyethylene glycol   Vital Signs    Vitals:   02/23/21 0745 02/23/21 0800 02/23/21 1145 02/23/21 1200  BP:  (!) 99/57  98/65  Pulse: 81 85  91  Resp: 16 (!) 9  15  Temp: (!) 97.3 F (36.3 C)  (!) 96.7 F (35.9 C)   TempSrc: Oral  Oral   SpO2: 100% 100%  100%  Weight:      Height:        Intake/Output Summary (Last 24 hours) at 02/23/2021 1503 Last data filed at 02/23/2021 1300 Gross per 24 hour  Intake 700 ml  Output --  Net 700 ml    Filed Weights   02/21/21 0141 02/22/21 0444 02/23/21 0408  Weight: 97.3 kg 99.2 kg 99.2 kg    Telemetry    Afib with ventricular rates in the 90s to low 100s bpm - Personally Reviewed  ECG    No new tracings - Personally Reviewed  Physical Exam   GEN: No acute distress.   Neck: JVD difficult to assess secondary to body habitus. Cardiac: IRIR, no  murmurs, rubs, or gallops.  Respiratory: Diminished and coarse breath sounds, particularly along the bases bilaterally.  GI: Soft, nontender, non-distended.   MS: Trace to 1+ bilateral lower extremity/stump edema; status post bilateral BKA. Neuro:  Alert and oriented x 3; Nonfocal.  Psych: Normal affect.  Labs    Chemistry Recent Labs  Lab 02/20/21 1602 02/21/21 0347 02/22/21 0349 02/23/21 0556  NA 138 141 138 137  K 3.0* 3.2* 3.3* 4.1  CL 100 104 103 102  CO2 26 25 25 22   GLUCOSE 124* 93 134* 118*  BUN 24* 27* 44* 57*  CREATININE 3.94* 4.56* 5.51* 6.33*  CALCIUM 9.4 8.7* 8.3* 8.3*  PROT 6.8  --   --   --   ALBUMIN 3.1*  --   --  2.3*  AST 18  --   --   --   ALT 9  --   --   --   ALKPHOS 150*  --   --   --   BILITOT 2.2*  --   --   --   GFRNONAA 15* 13* 10* 9*  ANIONGAP 12 12 10  13  Hematology Recent Labs  Lab 02/20/21 1602 02/21/21 0347 02/23/21 0556  WBC 5.4 5.1 4.8  RBC 3.76* 3.40* 2.85*  HGB 11.3* 10.3* 8.7*  HCT 36.7* 34.1* 28.2*  MCV 97.6 100.3* 98.9  MCH 30.1 30.3 30.5  MCHC 30.8 30.2 30.9  RDW 20.5* 20.6* 20.3*  PLT 68* 61* 55*     Cardiac EnzymesNo results for input(s): TROPONINI in the last 168 hours. No results for input(s): TROPIPOC in the last 168 hours.   BNP Recent Labs  Lab 02/20/21 2123  BNP >4,500.0*      DDimer No results for input(s): DDIMER in the last 168 hours.   Radiology    ECHOCARDIOGRAM COMPLETE  Result Date: 02/22/2021    ECHOCARDIOGRAM REPORT   Patient Name:   Antonio Phillips [facility] Date of Exam: 02/22/2021 Medical Rec #:  [id number]          Height:       71.0 in Accession #:    [id number]         Weight:       218.7 lb Date of Birth:  1945-05-05          BSA:          2.190 m Patient Age:    75 years           BP:           105/68 mmHg Patient Gender: M                  HR:           108 bpm. Exam Location:  [facility] Procedure: 2D Echo, Color Doppler, Cardiac Doppler and Intracardiac            Opacification Agent  Indications:     Elevated troponin  History:         Patient has prior history of Echocardiogram examinations, most                  recent 09/28/2020. ESRD and PAD, Arrythmias:Atrial Fibrillation;                  Risk Factors:Diabetes and Dyslipidemia.  Sonographer:     Charmayne Sheer Referring Phys:  [id number] URKYHCWC [name] Diagnosing Phys: Kathlyn Sacramento MD  Sonographer Comments: Suboptimal apical window and suboptimal subcostal window. Image acquisition challenging due to patient body habitus. IMPRESSIONS  1. Left ventricular ejection fraction, by estimation, is 40 to 45%. The left ventricle has mildly decreased function. Left ventricular endocardial border not optimally defined to evaluate regional wall motion. Left ventricular diastolic parameters are indeterminate.  2. Right ventricular systolic function is normal. The right ventricular size is normal. Tricuspid regurgitation signal is inadequate for assessing PA pressure.  3. Left atrial size was mildly dilated.  4. A small pericardial effusion is present. The pericardial effusion is posterior to the left ventricle. Moderate pleural effusion in the left lateral region.  5. The mitral valve is normal in structure. Mild mitral valve regurgitation. No evidence of mitral stenosis.  6. The aortic valve is normal in structure. Aortic valve regurgitation is not visualized. Aortic valve sclerosis/calcification is present, without any evidence of aortic stenosis.  7. Moderately dilated pulmonary artery.  8. The inferior vena cava is dilated in size with <50% respiratory variability, suggesting right atrial pressure of 15 mmHg. FINDINGS  Left Ventricle: Left ventricular ejection fraction, by estimation, is 40 to 45%. The left ventricle has mildly decreased function. Left ventricular endocardial border not  optimally defined to evaluate regional wall motion. Definity contrast agent was given IV to delineate the left ventricular endocardial borders. The left ventricular  internal cavity size was normal in size. There is no left ventricular hypertrophy. Left ventricular diastolic parameters are indeterminate. Right Ventricle: The right ventricular size is normal. No increase in right ventricular wall thickness. Right ventricular systolic function is normal. Tricuspid regurgitation signal is inadequate for assessing PA pressure. Left Atrium: Left atrial size was mildly dilated. Right Atrium: Right atrial size was normal in size. Pericardium: A small pericardial effusion is present. The pericardial effusion is posterior to the left ventricle. Mitral Valve: The mitral valve is normal in structure. Mild mitral annular calcification. Mild mitral valve regurgitation. No evidence of mitral valve stenosis. MV peak gradient, 5.4 mmHg. The mean mitral valve gradient is 2.0 mmHg. Tricuspid Valve: The tricuspid valve is normal in structure. Tricuspid valve regurgitation is not demonstrated. No evidence of tricuspid stenosis. Aortic Valve: The aortic valve is normal in structure. Aortic valve regurgitation is not visualized. Aortic valve sclerosis/calcification is present, without any evidence of aortic stenosis. Aortic valve mean gradient measures 9.0 mmHg. Aortic valve peak  gradient measures 17.6 mmHg. Aortic valve area, by VTI measures 1.56 cm. Pulmonic Valve: The pulmonic valve was normal in structure. Pulmonic valve regurgitation is mild. No evidence of pulmonic stenosis. Aorta: The aortic root is normal in size and structure. Pulmonary Artery: The pulmonary artery is moderately dilated. Venous: The inferior vena cava is dilated in size with less than 50% respiratory variability, suggesting right atrial pressure of 15 mmHg. IAS/Shunts: No atrial level shunt detected by color flow Doppler. Additional Comments: There is a moderate pleural effusion in the left lateral region.  LEFT VENTRICLE PLAX 2D LVIDd:         5.03 cm   Diastology LVIDs:         3.64 cm   LV e' medial:    6.96 cm/s LV PW:          0.91 cm   LV E/e' medial:  13.1 LV IVS:        0.92 cm   LV e' lateral:   7.29 cm/s LVOT diam:     2.10 cm   LV E/e' lateral: 12.5 LV SV:         53 LV SV Index:   24 LVOT Area:     3.46 cm  RIGHT VENTRICLE RV Basal diam:  3.11 cm LEFT ATRIUM         Index       RIGHT ATRIUM           Index LA diam:    4.30 cm 1.96 cm/m  RA Area:     19.90 cm                                 RA Volume:   49.90 ml  22.78 ml/m  AORTIC VALVE                     PULMONIC VALVE AV Area (Vmax):    1.46 cm      PV Vmax:          1.09 m/s AV Area (Vmean):   1.44 cm      PV Vmean:         69.800 cm/s AV Area (VTI):     1.56 cm      PV  VTI:           0.190 m AV Vmax:           210.00 cm/s   PV Peak grad:     4.8 mmHg AV Vmean:          140.000 cm/s  PV Mean grad:     2.0 mmHg AV VTI:            0.343 m       PR End Diast Vel: 3.07 msec AV Peak Grad:      17.6 mmHg AV Mean Grad:      9.0 mmHg LVOT Vmax:         88.80 cm/s LVOT Vmean:        58.400 cm/s LVOT VTI:          0.154 m LVOT/AV VTI ratio: 0.45  AORTA Ao Root diam: 3.30 cm MITRAL VALVE MV Area (PHT): 5.68 cm    SHUNTS MV Area VTI:   2.42 cm    Systemic VTI:  0.15 m MV Peak grad:  5.4 mmHg    Systemic Diam: 2.10 cm MV Mean grad:  2.0 mmHg MV Vmax:       1.16 m/s MV Vmean:      54.8 cm/s MV Decel Time: 134 msec MV E velocity: 90.83 cm/s Kathlyn Sacramento MD Electronically signed by Kathlyn Sacramento MD Signature Date/Time: 02/22/2021/3:32:30 PM    Final     Cardiac Studies   Limited echo 09/2020: 1. Left ventricular ejection fraction, by estimation, is 45 to 50%. The  left ventricle has mildly decreased function. Left ventricular endocardial  border not optimally defined to evaluate regional wall motion. There is  mild left ventricular hypertrophy.   2. Right ventricular systolic function is moderately reduced. The right  ventricular size is normal. Mildly increased right ventricular wall  thickness. There is moderately elevated pulmonary artery systolic  pressure.    3. The mitral valve is abnormal. Trivial mitral valve regurgitation.   4. The aortic valve is tricuspid. There is mild thickening of the aortic  valve.   5. The inferior vena cava is dilated in size with <50% respiratory  variability, suggesting right atrial pressure of 15 mmHg.  __________  Limited echo 09/2020: 1. Poor acoustic windows Endocardium is not well seen I would recomm  limited echo with Definity to help define LVEF and regional wall motion. .  There is mild left ventricular hypertrophy. Left ventricular diastolic  parameters are indeterminate.   2. RV is not seen well enough to evaluate function. . The right  ventricular size is mildly enlarged.   3. Left atrial size was severely dilated.   4. Right atrial size was severely dilated.   5. Mild mitral valve regurgitation.   6. Tricuspid valve regurgitation is mild to moderate.   7. The aortic valve is tricuspid. Aortic valve regurgitation is not  visualized. Mild aortic valve sclerosis is present, with no evidence of  aortic valve stenosis. __________  LHC 09/2020:   Ost LAD to Prox LAD lesion is 50% stenosed.   2nd Mrg lesion is 99% stenosed.   A drug-eluting stent was successfully placed using a STENT RESOLUTE ONYX 2.5X15.   Post intervention, there is a 30% residual stenosis.   1.  Severe single-vessel coronary artery disease with 99% subtotal stenosis of the second obtuse marginal branch, treated with a 2.5 x 15 mm resolute Onyx DES.  30% residual stenosis at the case completion due to resistant/calcified  lesion type even with high-pressure noncompliant balloon angioplasty. 2.  Mild to moderate nonobstructive proximal LAD stenosis 3.  Mild nonobstructive RCA stenosis  Recommendations: Aggressive medical therapy, as long as no bleeding complications arise, would resume apixaban tomorrow, aspirin 81 mg daily x30 days, and clopidogrel 75 mg daily x minimum 6 months. __________  2D echo 09/2020: 1. Left ventricular  ejection fraction, by estimation, is 60 to 65%. The  left ventricle has normal function. The left ventricle has no regional  wall motion abnormalities. Left ventricular diastolic parameters are  indeterminate.   2. Right ventricular systolic function is normal. The right ventricular  size is normal. Tricuspid regurgitation signal is inadequate for assessing  PA pressure.   3. Left atrial size was moderately dilated.   4. Right atrial size was moderately dilated.   5. Tricuspid valve regurgitation is mild to moderate.   6. The mitral valve is normal in structure. Mild mitral valve  regurgitation.   7. Rhythym is atrial fibrillation   Patient Profile     75 y.o. 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 fib on Eliquis, ESRD on HD, hypotension, DM2, anemia of chronic disease, morbid obesity presented with low-grade fever and chills with mental status changes and reported abdominal pain.  He was found to have elevated troponin.  Assessment & Plan    1.  CAD involving the native coronary arteries with demand ischemia: -No angina  -Elevated troponin to 2400 without ischemic symptoms.  Suspect again this is due to supply demand ischemia.  No plans for ischemic cardiac evaluation . Echocardiogram showed an EF of 40 to 45% with mild mitral regurgitation. -Given the patient's continued issues with thrombocytopenia, I am going to discontinue clopidogrel  as his PCI was close to 6 months ago. Continue treatment with a statin.-  2.  Permanent A. fib: -Ventricular rates reasonably controlled  -Consider resuming small dose metoprolol 12.5 mg twice daily for rate control . Is currently on anticoagulation with Eliquis 5 mg twice daily but if platelet count continues to drop, this might have to be discontinued.  Platelet count from today is 55,000.  3.  HFmrEF: -Relatively stable ejection fraction at 40 to 45%.  4.  Recent  gallstone pancreatitis: -Status post ERCP with stenting   5.  ESRD: -HD per nephrology     For questions or updates, please contact [place] Please consult [email or web address] for contact info under Cardiology/STEMI.    Signed, Kathlyn Sacramento, MD [facility] 02/23/2021, 3:03 PM

## 2021-02-23 NOTE — Progress Notes (Signed)
[facility] [place] NAME: Antonio Phillips    MR#:  [id number]  DATE OF BIRTH:  1945/08/01  SUBJECTIVE:  patient sitting up in the ICU bed. Feels a lot better. Eating good diet. Sats more than 92% on room air. Denies any complaints. No shortness of chest pain. Mentation is at baseline. No family at bed side  REVIEW OF SYSTEMS:   Review of Systems  Constitutional:  Negative for chills, fever and weight loss.  HENT:  Negative for ear discharge, ear pain and nosebleeds.   Eyes:  Negative for blurred vision, pain and discharge.  Respiratory:  Negative for sputum production, shortness of breath, wheezing and stridor.   Cardiovascular:  Negative for chest pain, palpitations, orthopnea and PND.  Gastrointestinal:  Negative for abdominal pain, diarrhea, nausea and vomiting.  Genitourinary:  Negative for frequency and urgency.  Musculoskeletal:  Negative for back pain and joint pain.  Neurological:  Positive for weakness. Negative for sensory change, speech change and focal weakness.  Psychiatric/Behavioral:  Negative for depression and hallucinations. The patient is not nervous/anxious.   Tolerating Diet: Tolerating PT:   DRUG ALLERGIES:   Allergies  Allergen Reactions   Simvastatin Other (See Comments)    VITALS:  Blood pressure 109/88, pulse (!) 55, temperature 98.3 F (36.8 C), temperature source Oral, resp. rate (!) 23, height 5\' 11"  (1.803 m), weight 104 kg, SpO2 99 %.  PHYSICAL EXAMINATION:   Physical Exam  GENERAL:  75 y.o.-year-old patient lying in the bed with no acute distress. Chronically ill HEENT: Head atraumatic, normocephalic. Oropharynx and nasopharynx clear.  LUNGS: Normal breath sounds bilaterally, no wheezing, rales, rhonchi. No use of accessory muscles of respiration.  CARDIOVASCULAR: S1, S2 normal. No murmurs, rubs, or gallops.  ABDOMEN: Soft, nontender, nondistended. Bowel sounds present. No organomegaly or mass.   EXTREMITIES: right BKA stump well healed. Left a.k.a. stump small ulcer no discharge. Right hip small drainage chronic from previous surgery NEUROLOGIC: nonfocal PSYCHIATRIC:  patient is alert and oriented x 3.  SKIN:  Pressure Injury 02/02/21 Hip Left Stage 3 -  Full thickness tissue loss. Subcutaneous fat may be visible but bone, tendon or muscle are NOT exposed. open hole (Active)  02/02/21 0941  Location: Hip  Location Orientation: Left  Staging: Stage 3 -  Full thickness tissue loss. Subcutaneous fat may be visible but bone, tendon or muscle are NOT exposed.  Wound Description (Comments): open hole  Present on Admission: Yes     Pressure Injury 02/02/21 Coccyx Mid Stage 2 -  Partial thickness loss of dermis presenting as a shallow open injury with a red, pink wound bed without slough. opening to middle of coccyx (Active)  02/02/21 1252  Location: Coccyx  Location Orientation: Mid  Staging: Stage 2 -  Partial thickness loss of dermis presenting as a shallow open injury with a red, pink wound bed without slough.  Wound Description (Comments): opening to middle of coccyx  Present on Admission: Yes        LABORATORY PANEL:  CBC Recent Labs  Lab 02/23/21 0556  WBC 4.8  HGB 8.7*  HCT 28.2*  PLT 55*    Chemistries  Recent Labs  Lab 02/20/21 1602 02/21/21 0347 02/23/21 0556  NA 138   < > 137  K 3.0*   < > 4.1  CL 100   < > 102  CO2 26   < > 22  GLUCOSE 124*   < > 118*  BUN  24*   < > 57*  CREATININE 3.94*   < > 6.33*  CALCIUM 9.4   < > 8.3*  MG  --    < > 1.8  AST 18  --   --   ALT 9  --   --   ALKPHOS 150*  --   --   BILITOT 2.2*  --   --    < > = values in this interval not displayed.   Cardiac Enzymes No results for input(s): TROPONINI in the last 168 hours. RADIOLOGY:  ECHOCARDIOGRAM COMPLETE  Result Date: 02/22/2021    ECHOCARDIOGRAM REPORT   Patient Name:   Antonio Phillips [facility] Date of Exam: 02/22/2021 Medical Rec #:  [id number]          Height:        71.0 in Accession #:    [id number]         Weight:       218.7 lb Date of Birth:  [birth year]          BSA:          2.190 m Patient Age:    26 years           BP:           105/68 mmHg Patient Gender: M                  HR:           108 bpm. Exam Location:  [facility] Procedure: 2D Echo, Color Doppler, Cardiac Doppler and Intracardiac            Opacification Agent Indications:     Elevated troponin  History:         Patient has prior history of Echocardiogram examinations, most                  recent 09/28/2020. ESRD and PAD, Arrythmias:Atrial Fibrillation;                  Risk Factors:Diabetes and Dyslipidemia.  Sonographer:     Charmayne Sheer Referring Phys:  [id number] A ARIDA Diagnosing Phys: Kathlyn Sacramento MD  Sonographer Comments: Suboptimal apical window and suboptimal subcostal window. Image acquisition challenging due to patient body habitus. IMPRESSIONS  1. Left ventricular ejection fraction, by estimation, is 40 to 45%. The left ventricle has mildly decreased function. Left ventricular endocardial border not optimally defined to evaluate regional wall motion. Left ventricular diastolic parameters are indeterminate.  2. Right ventricular systolic function is normal. The right ventricular size is normal. Tricuspid regurgitation signal is inadequate for assessing PA pressure.  3. Left atrial size was mildly dilated.  4. A small pericardial effusion is present. The pericardial effusion is posterior to the left ventricle. Moderate pleural effusion in the left lateral region.  5. The mitral valve is normal in structure. Mild mitral valve regurgitation. No evidence of mitral stenosis.  6. The aortic valve is normal in structure. Aortic valve regurgitation is not visualized. Aortic valve sclerosis/calcification is present, without any evidence of aortic stenosis.  7. Moderately dilated pulmonary artery.  8. The inferior vena cava is dilated in size with <50% respiratory variability, suggesting right atrial pressure  of 15 mmHg. FINDINGS  Left Ventricle: Left ventricular ejection fraction, by estimation, is 40 to 45%. The left ventricle has mildly decreased function. Left ventricular endocardial border not optimally defined to evaluate regional wall motion. Definity contrast agent was given IV to delineate the left ventricular endocardial  borders. The left ventricular internal cavity size was normal in size. There is no left ventricular hypertrophy. Left ventricular diastolic parameters are indeterminate. Right Ventricle: The right ventricular size is normal. No increase in right ventricular wall thickness. Right ventricular systolic function is normal. Tricuspid regurgitation signal is inadequate for assessing PA pressure. Left Atrium: Left atrial size was mildly dilated. Right Atrium: Right atrial size was normal in size. Pericardium: A small pericardial effusion is present. The pericardial effusion is posterior to the left ventricle. Mitral Valve: The mitral valve is normal in structure. Mild mitral annular calcification. Mild mitral valve regurgitation. No evidence of mitral valve stenosis. MV peak gradient, 5.4 mmHg. The mean mitral valve gradient is 2.0 mmHg. Tricuspid Valve: The tricuspid valve is normal in structure. Tricuspid valve regurgitation is not demonstrated. No evidence of tricuspid stenosis. Aortic Valve: The aortic valve is normal in structure. Aortic valve regurgitation is not visualized. Aortic valve sclerosis/calcification is present, without any evidence of aortic stenosis. Aortic valve mean gradient measures 9.0 mmHg. Aortic valve peak  gradient measures 17.6 mmHg. Aortic valve area, by VTI measures 1.56 cm. Pulmonic Valve: The pulmonic valve was normal in structure. Pulmonic valve regurgitation is mild. No evidence of pulmonic stenosis. Aorta: The aortic root is normal in size and structure. Pulmonary Artery: The pulmonary artery is moderately dilated. Venous: The inferior vena cava is dilated in size  with less than 50% respiratory variability, suggesting right atrial pressure of 15 mmHg. IAS/Shunts: No atrial level shunt detected by color flow Doppler. Additional Comments: There is a moderate pleural effusion in the left lateral region.  LEFT VENTRICLE PLAX 2D LVIDd:         5.03 cm   Diastology LVIDs:         3.64 cm   LV e' medial:    6.96 cm/s LV PW:         0.91 cm   LV E/e' medial:  13.1 LV IVS:        0.92 cm   LV e' lateral:   7.29 cm/s LVOT diam:     2.10 cm   LV E/e' lateral: 12.5 LV SV:         53 LV SV Index:   24 LVOT Area:     3.46 cm  RIGHT VENTRICLE RV Basal diam:  3.11 cm LEFT ATRIUM         Index       RIGHT ATRIUM           Index LA diam:    4.30 cm 1.96 cm/m  RA Area:     19.90 cm                                 RA Volume:   49.90 ml  22.78 ml/m  AORTIC VALVE                     PULMONIC VALVE AV Area (Vmax):    1.46 cm      PV Vmax:          1.09 m/s AV Area (Vmean):   1.44 cm      PV Vmean:         69.800 cm/s AV Area (VTI):     1.56 cm      PV VTI:           0.190 m AV Vmax:  210.00 cm/s   PV Peak grad:     4.8 mmHg AV Vmean:          140.000 cm/s  PV Mean grad:     2.0 mmHg AV VTI:            0.343 m       PR End Diast Vel: 3.07 msec AV Peak Grad:      17.6 mmHg AV Mean Grad:      9.0 mmHg LVOT Vmax:         88.80 cm/s LVOT Vmean:        58.400 cm/s LVOT VTI:          0.154 m LVOT/AV VTI ratio: 0.45  AORTA Ao Root diam: 3.30 cm MITRAL VALVE MV Area (PHT): 5.68 cm    SHUNTS MV Area VTI:   2.42 cm    Systemic VTI:  0.15 m MV Peak grad:  5.4 mmHg    Systemic Diam: 2.10 cm MV Mean grad:  2.0 mmHg MV Vmax:       1.16 m/s MV Vmean:      54.8 cm/s MV Decel Time: 134 msec MV E velocity: 90.83 cm/s Kathlyn Sacramento MD Electronically signed by Kathlyn Sacramento MD Signature Date/Time: 02/22/2021/3:32:30 PM    Final    ASSESSMENT AND PLAN:  75  y.o with significant PMH of ESRD on HD, CAD, gallstone pancreatitis s/p ERCP and stenting of the CBD 12.1.2022., chronic thrombocytopenia, PAD  s/p L BKA and R BKA, Diabetes mellitus, permanent A. fib on Eliquis, CAD/NSTEMI s/p DES stent to obtuse marginal mild RCA stenosis, mild to mod pLAD disease, HFmrEF who was sent to the ED from [facility] due to patient being too lethargic for HD.  Acute metabolic encephalopathy improved -- CT head negative -- patient's mentation is back to baseline  suspected sepsis from possible UTI -- patient is end-stage renal disease does not make much urine -- received three days of cephalosporin. Patient is afebrile white count is normal. -- Will discontinue antibiotics and observe. -- Blood culture negative -- M RSA PCR negative -- COVID negative  end-stage renal disease on hemodialysis -- nephrology consulted for in-house dialysis  CAD status post DS second marginal on July 2022 -- continue aspirin -- continue statins and metoprolol -- per Dr. Fletcher Anon discontinue Plavix secondary to platelet count. permanent atrial fibrillation -- continue metoprolol and eliquis -- per Dr. Fletcher Anon "Is currently on anticoagulation with Eliquis 5 mg twice daily but if platelet count continues to drop, this might have to be discontinued.  Platelet count from today is 55,000.'  History of recent gallstone pancreatitis -- status post ERCP with stent    Procedures: Family communication : none Consults : cardiology, nephrology CODE STATUS: full code DVT Prophylaxis : eliquis Level of care: Progressive Status is: Inpatient  Remains inpatient appropriate because: altered mental status. Improving anticipate discharge tomorrow to his long-term facility.  TOC for discharge planning        TOTAL TIME TAKING CARE OF THIS PATIENT: 25 minutes.  >50% time spent on counselling and coordination of care  Note: This dictation was prepared with Dragon dictation along with smaller phrase technology. Any transcriptional errors that result from this process are unintentional.  Fritzi Mandes M.D    [facility]   CC: Primary care physician; Marsh Dolly, MD Patient ID: Antonio Phillips, male   DOB: Aug 08, 1945, 75 y.o.   MRN: [id number]

## 2021-02-23 NOTE — Progress Notes (Addendum)
Right hip wound with packing noted on patient assessment.  Patient also has a stage 3 wound on left hip and a full thickness wound on left stump.  Reviewed orders and did not see a wound care order.  Patient advised WOC RN was following patient at his assisted living facility and that there was a WOC consult on prior admission.  Reviewed notes and located [place] nurse note from prior admission on 02/02/2021:  [place] Nurse wound follow up Consult requested for bilat hip wounds and left stump.  Performed yesterday to right hip and left stump, but I was not aware of left hip wound at that time.  Refer to previous progress notes for assessment and measurements.   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 Darker colored skin surrounding. Pressure injury present on admission: yes Topical treatment orders provided for bedside nurses to perform as follows: 1. Tuck a piece of gauze packing strip Kellie Simmering # [phone]) into right hip wound Q day, using a swab to fill and leaving a tail sticking out of the wound. Cover with foam dressing.  (Change foam dressing Q 3 days or PRN soiling.) 2. Foam dressing to left stump and left hip, change Q 3 days or PRN soiling  Placed wound care orders.   0900 - Packing strip Lawson # [phone] not available.  Re-consulted WOC RN to advise.  Received new order for iodoform gauze packing strip Kellie Simmering # [phone]).

## 2021-02-24 ENCOUNTER — Inpatient Hospital Stay: Payer: Medicare Other

## 2021-02-24 DIAGNOSIS — I4811 Longstanding persistent atrial fibrillation: Secondary | ICD-10-CM

## 2021-02-24 DIAGNOSIS — J811 Chronic pulmonary edema: Secondary | ICD-10-CM

## 2021-02-24 DIAGNOSIS — K851 Biliary acute pancreatitis without necrosis or infection: Secondary | ICD-10-CM

## 2021-02-24 LAB — CBC WITH DIFFERENTIAL/PLATELET
Abs Immature Granulocytes: 0.02 10*3/uL (ref 0.00–0.07)
Basophils Absolute: 0 10*3/uL (ref 0.0–0.1)
Basophils Relative: 1 %
Eosinophils Absolute: 0.1 10*3/uL (ref 0.0–0.5)
Eosinophils Relative: 2 %
HCT: 29.2 % — ABNORMAL LOW (ref 39.0–52.0)
Hemoglobin: 9.2 g/dL — ABNORMAL LOW (ref 13.0–17.0)
Immature Granulocytes: 0 %
Lymphocytes Relative: 35 %
Lymphs Abs: 2 10*3/uL (ref 0.7–4.0)
MCH: 30.8 pg (ref 26.0–34.0)
MCHC: 31.5 g/dL (ref 30.0–36.0)
MCV: 97.7 fL (ref 80.0–100.0)
Monocytes Absolute: 0.6 10*3/uL (ref 0.1–1.0)
Monocytes Relative: 11 %
Neutro Abs: 2.8 10*3/uL (ref 1.7–7.7)
Neutrophils Relative %: 51 %
Platelets: 60 10*3/uL — ABNORMAL LOW (ref 150–400)
RBC: 2.99 MIL/uL — ABNORMAL LOW (ref 4.22–5.81)
RDW: 20.7 % — ABNORMAL HIGH (ref 11.5–15.5)
WBC: 5.6 10*3/uL (ref 4.0–10.5)
nRBC: 0.4 % — ABNORMAL HIGH (ref 0.0–0.2)

## 2021-02-24 MED ORDER — IOHEXOL 300 MG/ML  SOLN
100.0000 mL | Freq: Once | INTRAMUSCULAR | Status: AC | PRN
  Administered 2021-02-24: 12:00:00 100 mL via INTRAVENOUS

## 2021-02-24 NOTE — Progress Notes (Signed)
Late Entry:  Patient completed dialysis treatment as ordered. Nurse attending to other patient when patient machine alarmed that treatment was completed. Nurse immediately went to turn alarm and return patient blood. Patient started to question nurse regarding her leaving him and going home soon. Patient was reassured that nurse shift is not over and until all dialysis treatments are done nurse will be here to take care of him. Patient started to complain that he hasn't been treated well and threaten to call the police. Patient took his personal cell phone and made several phone calls. When nurse was ready to remove needles patient refused. Nursing manager called and instructed nurse to call security. Security came over, patient appears much calmer but still upset. Patient allowed nurse to remove needles. Transport came over to take patient back to his room after report was given to ICU nurse.

## 2021-02-24 NOTE — Progress Notes (Signed)
PROGRESS NOTE   Antonio Phillips  FIE:[id number] DOB: [birth year] DOA: 02/20/2021 PCP: Antonio Dolly, MD  Brief Narrative:  75 year old skilled facility dwelling white male ESRD HD TTS with chronic hypotension on midodrine PVD right BKA left AKA DM TY 2 CAD status post DES to obtuse marginal Permanent A. fib CHADS2 score >4 Eliquis  Recent admission 11/27 through 12/6 choledocholithiasis status post ERCP-brief stepdown stay-patient had biliary duct dilatation removal of 1 stone received 7-day course of Zosyn That admission pertinent for acute superimposed on chronic thrombocytopenia felt secondary to acute illness-at that admission Lyrica dosage increased from 75 to 100 mg daily  Readmit to ICU on 02/21/2021 altered mental status Lactic acid 2.4 troponin 1557-->2463 BNP >4500 CT head negative Code sepsis called started broad-spectrum antibiotics-CT chest not suggestive pneumonia-thought to have  UTI Nephrology consulted for HD, cardiology consulted Echo -12/19--- EF 40-45 % down from prior 60-65% in 09/2020  Hospital-Problem based course  Toxic metabolic encephalopathy secondary to possible infection Mental status is stable-- note patient had been recently placed on higher dosing of Lyrica Careful dosing Dilaudid 1 mg every 4 as needed, Norco for mild to moderate pain UTI versus hip pathology Initial diagnosis entertained was UTI--received cefepime 12/17 through 12/19, blood culture NGTD 12/17 Patient has had some drainage per his report from the right hip for the past 4 weeks Phillips get CT of hip stat rule out any collection of fluid-patient has [facility] Recent admission choledocholithiasis s/p ERCP stone removal completing 7 days Zosyn No concerns at this time no abdominal pain CAD status post DES 09/2020 previously on Plavix/aspirin 81 completed--demand type II MI  this admission troponin peak 2400' Permanent A. fib CHADS2 score >4 HFrEF  t-Echo this admission EF 40-45% (similar to 09/2020) continue low-dose of metoprolol 12.5 twice daily If platelets drop below 50 as per my discussion with Antonio Phillips, we Phillips stop in addition apixaban twice daily Chronic respiratory failure?  Present on admission Patient states has been using oxygen for the past month-we Phillips get a desat screen to see if he actually requires ESRD TTS Continuing Renvela 1.6 twice daily meals, erythropoietin TTS Phillips require midodrine 10 3 times daily with meals to maintain blood pressure Rest as per renal PVD with multiple amputations and hip repairs Ideally with secondary prevention would require Plavix Needs outpatient coordination to see if can resume if platelets resolve Thrombocytopenia?  Acquired-previous baseline 100s currently 4s to 43s Seen by Antonio Phillips 02/02/2021 Scheduled for outpatient reeval for consideration of work-up of ITP Labs from that admission showed immature platelet function 12.6, LDH 88 which may be concerning for the same-I Phillips message Antonio Phillips closer to discharge to ensure follow-up with complete Gallstone pancreatitis Had plastic stenting on ERCP 12/1-plastic stent was placed and it was recommended to repeat ERCP to remove the stent off of anticoagulation-we Phillips CC Gastroenterology Antonio Phillips to ensure not lost to follow-up  DVT prophylaxis: Apixaban Code Status: Full Family Communication: None present Disposition:  Status is: Inpatient Further work-up of Hip pathology   Consultants:  Cardiology  Procedures:   Antimicrobials: Cefepime metronidazole 12/17 through 12/19   Subjective: Somewhat hard of hearing otherwise is fair no distress Waiting on breakfast Has bilateral hip and thigh pain States that the right hip has been draining on and off for the past month No fever no chills is hungyr  Objective: Vitals:   02/24/21 0500 02/24/21 0600 02/24/21 0700 02/24/21 0800  BP: 99/72 97/65 Marland Kitchen)  85/54 97/68  Pulse: (!) 47  80 76 (!) 134  Resp:      Temp:    97.6 F (36.4 C)  TempSrc:    Oral  SpO2: 100% 100% 100% 97%  Weight: 99.7 kg     Height:        Intake/Output Summary (Last 24 hours) at 02/24/2021 1000 Last data filed at 02/24/2021 0800 Gross per 24 hour  Intake 340 ml  Output 1600 ml  Net -1260 ml   Filed Weights   02/23/21 1531 02/23/21 2109 02/24/21 0500  Weight: 104 kg 101.8 kg 99.7 kg    Examination: EOMI NCAT no focal deficit thick neck Mallampati 4 on oxygen CTA B no rales no rhonchi no added sound Abdomen obese nontender nondistended no rebound no guarding\ Right hip has dime sized area of scab with some tenderness and firmness of the upper thigh Left stump is slightly red around the area of the BKA and has similar tightness of the skin muscles Neurologically intact moving all 4 limbs equally power 5/5   Data Reviewed: personally reviewed   CBC    Component Value Date/Time   WBC 4.8 02/23/2021 0556   RBC 2.85 (L) 02/23/2021 0556   HGB 8.7 (L) 02/23/2021 0556   HGB 15.2 11/14/2012 2045   HCT 28.2 (L) 02/23/2021 0556   HCT 45.0 11/14/2012 2045   PLT 55 (L) 02/23/2021 0556   PLT 116 (L) 11/14/2012 2045   MCV 98.9 02/23/2021 0556   MCV 95 11/14/2012 2045   MCH 30.5 02/23/2021 0556   MCHC 30.9 02/23/2021 0556   RDW 20.3 (H) 02/23/2021 0556   RDW 15.2 (H) 11/14/2012 2045   LYMPHSABS 0.8 02/20/2021 1602   MONOABS 0.5 02/20/2021 1602   EOSABS 0.0 02/20/2021 1602   BASOSABS 0.0 02/20/2021 1602   CMP Latest Ref Rng & Units 02/23/2021 02/22/2021 02/21/2021  Glucose 70 - 99 mg/dL 118(H) 134(H) 93  BUN 8 - 23 mg/dL 57(H) 44(H) 27(H)  Creatinine 0.61 - 1.24 mg/dL 6.33(H) 5.51(H) 4.56(H)  Sodium 135 - 145 mmol/L 137 138 141  Potassium 3.5 - 5.1 mmol/L 4.1 3.3(L) 3.2(L)  Chloride 98 - 111 mmol/L 102 103 104  CO2 22 - 32 mmol/L 22 25 25   Calcium 8.9 - 10.3 mg/dL 8.3(L) 8.3(L) 8.7(L)  Total Protein 6.5 - 8.1 g/dL - - -  Total Bilirubin 0.3 - 1.2 mg/dL - - -  Alkaline Phos  38 - 126 U/L - - -  AST 15 - 41 U/L - - -  ALT 0 - 44 U/L - - -     Radiology Studies: ECHOCARDIOGRAM COMPLETE  Result Date: 02/22/2021    ECHOCARDIOGRAM REPORT   Patient Name:   Antonio WILLEMS [facility] Date of Exam: 02/22/2021 Medical Rec #:  [id number]          Height:       71.0 in Accession #:    [id number]         Weight:       218.7 lb Date of Birth:  Apr 28, 1945          BSA:          2.190 m Patient Age:    73 years           BP:           105/68 mmHg Patient Gender: M                  HR:  108 bpm. Exam Location:  [facility] Procedure: 2D Echo, Color Doppler, Cardiac Doppler and Intracardiac            Opacification Agent Indications:     Elevated troponin  History:         Patient has prior history of Echocardiogram examinations, most                  recent 09/28/2020. ESRD and PAD, Arrythmias:Atrial Fibrillation;                  Risk Factors:Diabetes and Dyslipidemia.  Sonographer:     Charmayne Sheer Referring Phys:  [id number] JHERDEYC A ARIDA Diagnosing Phys: Kathlyn Sacramento MD  Sonographer Comments: Suboptimal apical window and suboptimal subcostal window. Image acquisition challenging due to patient body habitus. IMPRESSIONS  1. Left ventricular ejection fraction, by estimation, is 40 to 45%. The left ventricle has mildly decreased function. Left ventricular endocardial border not optimally defined to evaluate regional wall motion. Left ventricular diastolic parameters are indeterminate.  2. Right ventricular systolic function is normal. The right ventricular size is normal. Tricuspid regurgitation signal is inadequate for assessing PA pressure.  3. Left atrial size was mildly dilated.  4. A small pericardial effusion is present. The pericardial effusion is posterior to the left ventricle. Moderate pleural effusion in the left lateral region.  5. The mitral valve is normal in structure. Mild mitral valve regurgitation. No evidence of mitral stenosis.  6. The aortic valve is normal in structure. Aortic  valve regurgitation is not visualized. Aortic valve sclerosis/calcification is present, without any evidence of aortic stenosis.  7. Moderately dilated pulmonary artery.  8. The inferior vena cava is dilated in size with <50% respiratory variability, suggesting right atrial pressure of 15 mmHg. FINDINGS  Left Ventricle: Left ventricular ejection fraction, by estimation, is 40 to 45%. The left ventricle has mildly decreased function. Left ventricular endocardial border not optimally defined to evaluate regional wall motion. Definity contrast agent was given IV to delineate the left ventricular endocardial borders. The left ventricular internal cavity size was normal in size. There is no left ventricular hypertrophy. Left ventricular diastolic parameters are indeterminate. Right Ventricle: The right ventricular size is normal. No increase in right ventricular wall thickness. Right ventricular systolic function is normal. Tricuspid regurgitation signal is inadequate for assessing PA pressure. Left Atrium: Left atrial size was mildly dilated. Right Atrium: Right atrial size was normal in size. Pericardium: A small pericardial effusion is present. The pericardial effusion is posterior to the left ventricle. Mitral Valve: The mitral valve is normal in structure. Mild mitral annular calcification. Mild mitral valve regurgitation. No evidence of mitral valve stenosis. MV peak gradient, 5.4 mmHg. The mean mitral valve gradient is 2.0 mmHg. Tricuspid Valve: The tricuspid valve is normal in structure. Tricuspid valve regurgitation is not demonstrated. No evidence of tricuspid stenosis. Aortic Valve: The aortic valve is normal in structure. Aortic valve regurgitation is not visualized. Aortic valve sclerosis/calcification is present, without any evidence of aortic stenosis. Aortic valve mean gradient measures 9.0 mmHg. Aortic valve peak  gradient measures 17.6 mmHg. Aortic valve area, by VTI measures 1.56 cm. Pulmonic Valve:  The pulmonic valve was normal in structure. Pulmonic valve regurgitation is mild. No evidence of pulmonic stenosis. Aorta: The aortic root is normal in size and structure. Pulmonary Artery: The pulmonary artery is moderately dilated. Venous: The inferior vena cava is dilated in size with less than 50% respiratory variability, suggesting right atrial pressure of 15 mmHg. IAS/Shunts: No  atrial level shunt detected by color flow Doppler. Additional Comments: There is a moderate pleural effusion in the left lateral region.  LEFT VENTRICLE PLAX 2D LVIDd:         5.03 cm   Diastology LVIDs:         3.64 cm   LV e' medial:    6.96 cm/s LV PW:         0.91 cm   LV E/e' medial:  13.1 LV IVS:        0.92 cm   LV e' lateral:   7.29 cm/s LVOT diam:     2.10 cm   LV E/e' lateral: 12.5 LV SV:         53 LV SV Index:   24 LVOT Area:     3.46 cm  RIGHT VENTRICLE RV Basal diam:  3.11 cm LEFT ATRIUM         Index       RIGHT ATRIUM           Index LA diam:    4.30 cm 1.96 cm/m  RA Area:     19.90 cm                                 RA Volume:   49.90 ml  22.78 ml/m  AORTIC VALVE                     PULMONIC VALVE AV Area (Vmax):    1.46 cm      PV Vmax:          1.09 m/s AV Area (Vmean):   1.44 cm      PV Vmean:         69.800 cm/s AV Area (VTI):     1.56 cm      PV VTI:           0.190 m AV Vmax:           210.00 cm/s   PV Peak grad:     4.8 mmHg AV Vmean:          140.000 cm/s  PV Mean grad:     2.0 mmHg AV VTI:            0.343 m       PR End Diast Vel: 3.07 msec AV Peak Grad:      17.6 mmHg AV Mean Grad:      9.0 mmHg LVOT Vmax:         88.80 cm/s LVOT Vmean:        58.400 cm/s LVOT VTI:          0.154 m LVOT/AV VTI ratio: 0.45  AORTA Ao Root diam: 3.30 cm MITRAL VALVE MV Area (PHT): 5.68 cm    SHUNTS MV Area VTI:   2.42 cm    Systemic VTI:  0.15 m MV Peak grad:  5.4 mmHg    Systemic Diam: 2.10 cm MV Mean grad:  2.0 mmHg MV Vmax:       1.16 m/s MV Vmean:      54.8 cm/s MV Decel Time: 134 msec MV E velocity: 90.83 cm/s  Kathlyn Sacramento MD Electronically signed by Kathlyn Sacramento MD Signature Date/Time: 02/22/2021/3:32:30 PM    Final      Scheduled Meds:  apixaban  5 mg Oral BID   atorvastatin  40 mg Oral QHS   Chlorhexidine Gluconate  Cloth  6 each Topical Daily   epoetin (EPOGEN/PROCRIT) injection  6,000 Units Intravenous Q T,Th,Sa-HD   feeding supplement (NEPRO CARB STEADY)  237 mL Oral TID BM   finasteride  5 mg Oral Daily   lidocaine  1 patch Transdermal Q24H   metoprolol tartrate  12.5 mg Oral BID   midodrine  10 mg Oral TID WC   multivitamin  1 tablet Oral QHS   rOPINIRole  1 mg Oral QHS   sevelamer carbonate  1,600 mg Oral Once per day on Sun Mon Wed Fri   sevelamer carbonate  1,600 mg Oral BID WC   sodium chloride flush  10 mL Intravenous Q12H   vitamin B-12  1,000 mcg Oral Daily   Continuous Infusions:   LOS: 3 days   Time spent: [place], MD [facility] To contact the attending provider between 7A-7P or the covering provider during after hours 7P-7A, please log into the web site [email or web address] and access using universal [place] password for that web site. If you do not have the password, please call the hospital operator.  02/24/2021, 10:00 AM

## 2021-02-24 NOTE — Progress Notes (Signed)
Progress Note  Patient Name: Antonio Phillips Date of Encounter: 02/24/2021  Primary Cardiologist: Ida Rogue, MD  Subjective   No significant change in breathing.  Still notes dyspnea with moving around in bed.  No chest pain.  Inpatient Medications    Scheduled Meds:  apixaban  5 mg Oral BID   atorvastatin  40 mg Oral QHS   epoetin (EPOGEN/PROCRIT) injection  6,000 Units Intravenous Q T,Th,Sa-HD   feeding supplement (NEPRO CARB STEADY)  237 mL Oral TID BM   finasteride  5 mg Oral Daily   lidocaine  1 patch Transdermal Q24H   metoprolol tartrate  12.5 mg Oral BID   midodrine  10 mg Oral TID WC   multivitamin  1 tablet Oral QHS   rOPINIRole  1 mg Oral QHS   sevelamer carbonate  1,600 mg Oral Once per day on Sun Mon Wed Fri   sevelamer carbonate  1,600 mg Oral BID WC   sodium chloride flush  10 mL Intravenous Q12H   vitamin B-12  1,000 mcg Oral Daily   Continuous Infusions:  PRN Meds: docusate sodium, HYDROcodone-acetaminophen, HYDROmorphone (DILAUDID) injection, polyethylene glycol   Vital Signs    Vitals:   02/24/21 1000 02/24/21 1100 02/24/21 1213 02/24/21 1512  BP: 106/75 92/69 (!) 104/56 (!) 106/58  Pulse: (!) 133 76 90 71  Resp: 15 14 18 17   Temp:   98.2 F (36.8 C) 98.3 F (36.8 C)  TempSrc:    Oral  SpO2: 97% 100% 94% 96%  Weight:      Height:        Intake/Output Summary (Last 24 hours) at 02/24/2021 1540 Last data filed at 02/24/2021 1500 Gross per 24 hour  Intake 1060 ml  Output 1600 ml  Net -540 ml   Filed Weights   02/23/21 1531 02/23/21 2109 02/24/21 0500  Weight: 104 kg 101.8 kg 99.7 kg    Physical Exam   GEN: Obese, in no acute distress.  HEENT: Grossly normal.  Neck: Obese, unable to gauge JVP 2/2 body habitus, no carotid bruits or masses. Cardiac: IR, IR, no murmurs, rubs, or gallops. Bilat BKA. Radials 2+. Respiratory:  Respirations regular and unlabored, bibasilar crackles, otw CTA. GI: Obese, soft, nontender,  nondistended, BS + x 4. MS: bilat bka. Skin: warm and dry, no rash. Neuro:  Strength and sensation are intact. Psych: AAOx3.  Flat affect.  Labs    Chemistry Recent Labs  Lab 02/20/21 1602 02/21/21 0347 02/22/21 0349 02/23/21 0556  NA 138 141 138 137  K 3.0* 3.2* 3.3* 4.1  CL 100 104 103 102  CO2 26 25 25 22   GLUCOSE 124* 93 134* 118*  BUN 24* 27* 44* 57*  CREATININE 3.94* 4.56* 5.51* 6.33*  CALCIUM 9.4 8.7* 8.3* 8.3*  PROT 6.8  --   --   --   ALBUMIN 3.1*  --   --  2.3*  AST 18  --   --   --   ALT 9  --   --   --   ALKPHOS 150*  --   --   --   BILITOT 2.2*  --   --   --   GFRNONAA 15* 13* 10* 9*  ANIONGAP 12 12 10 13      Hematology Recent Labs  Lab 02/21/21 0347 02/23/21 0556 02/24/21 0942  WBC 5.1 4.8 5.6  RBC 3.40* 2.85* 2.99*  HGB 10.3* 8.7* 9.2*  HCT 34.1* 28.2* 29.2*  MCV 100.3* 98.9 97.7  MCH 30.3 30.5 30.8  MCHC 30.2 30.9 31.5  RDW 20.6* 20.3* 20.7*  PLT 61* 55* 60*    Cardiac Enzymes  Recent Labs  Lab 02/02/21 2135 02/05/21 0416 02/05/21 0845 02/20/21 2123 02/20/21 2315  TROPONINIHS 397* 147* 146* 1,557* 2,463*      BNP Recent Labs  Lab 02/20/21 2123  BNP >4,500.0*    Lipids  Lab Results  Component Value Date   CHOL 58 09/23/2020   HDL 23 (L) 09/23/2020   LDLCALC 22 09/23/2020   TRIG 63 09/23/2020   CHOLHDL 2.5 09/23/2020    HbA1c  Lab Results  Component Value Date   HGBA1C 5.5 11/10/2018    Radiology    DG Chest 2 View  Result Date: 02/20/2021 CLINICAL DATA:  Weakness EXAM: CHEST - 2 VIEW COMPARISON:  Radiograph 02/05/2021 FINDINGS: Unchanged enlarged cardiac silhouette. There are bibasilar airspace opacities. Mild diffuse interstitial opacities. Trace pleural effusions may be present. No visible pneumothorax. No acute osseous abnormality. Thoracic spondylosis. IMPRESSION: Cardiomegaly with interstitial pulmonary edema and bibasilar opacities which could be alveolar edema, atelectasis, or infection. Electronically  Signed   By: Maurine Simmering M.D.   On: 02/20/2021 15:28   CT Head Wo Contrast  Result Date: 02/20/2021 CLINICAL DATA:  Altered mental status EXAM: CT HEAD WITHOUT CONTRAST TECHNIQUE: Contiguous axial images were obtained from the base of the skull through the vertex without intravenous contrast. COMPARISON:  12/03/2009 FINDINGS: Brain: Normal anatomic configuration. Parenchymal volume loss is commensurate with the patient's age, though progressive since prior examination. Mild periventricular white matter changes are present likely reflecting the sequela of small vessel ischemia. Physiologic calcification within the globus pallidi again noted. No abnormal intra or extra-axial mass lesion or fluid collection. No abnormal mass effect or midline shift. No evidence of acute intracranial hemorrhage or infarct. Ventricular size is normal. Cerebellum unremarkable. Vascular: No asymmetric hyperdense vasculature at the skull base. Advanced calcifications are noted within the carotid siphons and distal vertebral arteries bilaterally. Skull: Intact Sinuses/Orbits: Mild mucosal thickening within the frontal sinuses. Remaining paranasal sinuses are clear. Ocular lenses have been removed. Orbits are otherwise unremarkable. Other: There is fluid opacification of the mastoid air cells bilaterally without osseous erosion. Fluid debris is also noted within the left middle ear cavity. IMPRESSION: No acute intracranial abnormality. Progressive senescent change. Mild bifrontal paranasal sinus disease. Bilateral mastoid effusions. Fluid within the left middle ear cavity. Clinical correlation for signs and symptoms of otomastoiditis may be helpful. Electronically Signed   By: Fidela Salisbury M.D.   On: 02/20/2021 21:59   CT CHEST ABDOMEN PELVIS WO CONTRAST  Result Date: 02/20/2021 CLINICAL DATA:  Suspected complicated pneumonia, abdominal discomfort also. End-stage renal failure patient on dialysis. EXAM: CT CHEST, ABDOMEN AND  PELVIS WITHOUT CONTRAST TECHNIQUE: Multidetector CT imaging of the chest, abdomen and pelvis was performed following the standard protocol without IV contrast. COMPARISON:  Chest, abdomen and pelvis CT no contrast 01/31/2021, CT angiography chest 09/20/2020. FINDINGS: CT CHEST FINDINGS Cardiovascular: Moderate panchamber cardiomegaly with small stable pericardial effusion. Three-vessel heavy calcific CAD. Increasingly prominent pulmonary trunk measuring 3.8 cm indicating arterial hypertension. Patchy aortic atherosclerosis with tortuosity also noted without aneurysm. Mildly distended superior pulmonary veins. Mediastinum/Nodes: Old right hemithyroidectomy with unremarkable left lobe. Stable small subcentimeter scattered mediastinal nodes. No bulky or encasing adenopathy. Axillary spaces are clear. Subareolar gynecomastia. Lungs/Pleura: Small to moderate-sized increased layering pleural effusions. Slight increased subpleural septal lines in the bases consistent with interstitial edema. There is consolidation or atelectasis in the posterior  lower lobes alongside the effusions, more simple compressive atelectasis posteriorly in the upper lobes. No filling defect in the trachea and central airways. Musculoskeletal: Mild but increased body wall anasarca. 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 Osteopenia and slight thoracic dextroscoliosis. No worrisome thoracic bone lesion. CT ABDOMEN PELVIS FINDINGS Hepatobiliary: No liver mass is seen without contrast. There are stones in the gallbladder, and mild gallbladder thickening or trace pericholecystic fluid but this was seen on both of the prior studies it could simply be congestive. The common bile duct has been stented since the prior study with the distal stent just inside the descending duodenum and interval common bile duct decompression. Pancreas: There is slight peripancreatic stranding, but significantly improved compared to the prior study. Punctate  parenchymal calcifications of chronic calcific pancreatitis are again shown but there is no visible mass or ductal dilatation. Spleen: Stable mild enlargement measuring 15.4 cm AP, with vascular calcifications in the splenic substance. Adrenals/Urinary Tract: No adrenal mass. Atrophic kidneys with bilateral cysts. 1.3 cm probable hyperdense cyst in the superior pole left kidney of 52 Hounsfield units. Consider follow-up ultrasound or MRI to assess for internal complexity. Other cysts are low in density. There are heavy renal vascular calcifications. No hydronephrosis or stone. The bladder mostly obscured by bilateral hip replacements. The dome is mildly thickened as before. Stomach/Bowel: Increased thickened folds in the stomach. Probable gastritis. No bowel wall thickening or dilatation including the appendix with sigmoid diverticulosis again seen. There is a surgically widened small bowel segment in the anterior mid abdomen which is unchanged. The rectum is obscured by the hip hardware. Vascular/Lymphatic: Extensive aortoiliac and visceral arterial calcific plaques. No AAA. Multiple subcentimeter up to borderline size retroperitoneal nodes are redemonstrated unchanged. Assessment for pelvic adenopathy very limited due to the hip hardware but no obvious mass. Reproductive: Obscured prostate, unable to evaluate due to the hip hardware. Other: Minimal ascites again noted in the abdomen and pelvis, but increased compared to the prior study with increased body wall anasarca. Small umbilical and inguinal fat hernias. Musculoskeletal: Bilateral hip replacements. Osteopenia and advanced degenerative change of the spine with multilevel bridging osteophytes. Ankylosis right SI joint. 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 IMPRESSION: 1. Mild features of CHF, with increased small to moderate bilateral layering pleural effusions and with consolidation or atelectasis in the  adjacent lower lobes. No other focal pulmonary abnormality. 2. Increased body wall anasarca and small-volume abdominal and pelvic ascites. 3. Cardiomegaly with aortic and coronary artery atherosclerosis, extensive vascular calcifications in the abdomen including visceral arteries. 4. Increased prominence of the pulmonary trunk.  No aortic aneurysm. 5. Significantly improved findings of pancreatitis with only minimal peripancreatic stranding today. 6. Since 01/31/2021, interval CBD stenting and resolution of choledocholithiasis and biliary dilatation. 7. Cholelithiasis with mild gallbladder thickening and pericholecystic fluid which was seen on both prior studies and could be congestive. Correlate clinically for cholecystitis. 8. Renal atrophy and cysts with 1.3 cm probable hyperdense cyst left superior pole. MRI or ultrasound recommended to assess for internal complexity. 9. Possible cystitis. 10. Hip hardware obscuring visualization of the lower bladder, rectum and pelvic sidewalls. 11. Probable gastritis and additional findings discussed above. No intestinal dilatation or wall thickening. Diverticulosis. Electronically Signed   By: Telford Nab M.D.   On: 02/20/2021 22:20    Telemetry    Afib, PVCs, 90's to 100's - Personally Reviewed  Cardiac Studies   Limited echo 09/2020: 1. Left ventricular ejection fraction, by estimation, is 45  to 50%. The  left ventricle has mildly decreased function. Left ventricular endocardial  border not optimally defined to evaluate regional wall motion. There is  mild left ventricular hypertrophy.   2. Right ventricular systolic function is moderately reduced. The right  ventricular size is normal. Mildly increased right ventricular wall  thickness. There is moderately elevated pulmonary artery systolic  pressure.   3. The mitral valve is abnormal. Trivial mitral valve regurgitation.   4. The aortic valve is tricuspid. There is mild thickening of the aortic  valve.    5. The inferior vena cava is dilated in size with <50% respiratory  variability, suggesting right atrial pressure of 15 mmHg.  __________   LHC 09/2020:   Ost LAD to Prox LAD lesion is 50% stenosed.   2nd Mrg lesion is 99% stenosed.   A drug-eluting stent was successfully placed using a STENT RESOLUTE ONYX 2.5X15.   Post intervention, there is a 30% residual stenosis.   1.  Severe single-vessel coronary artery disease with 99% subtotal stenosis of the second obtuse marginal branch, treated with a 2.5 x 15 mm resolute Onyx DES.  30% residual stenosis at the case completion due to resistant/calcified lesion type even with high-pressure noncompliant balloon angioplasty. 2.  Mild to moderate nonobstructive proximal LAD stenosis 3.  Mild nonobstructive RCA stenosis  Recommendations: Aggressive medical therapy, as long as no bleeding complications arise, would resume apixaban tomorrow, aspirin 81 mg daily x30 days, and clopidogrel 75 mg daily x minimum 6 months. __________   2D Echocardiogram 12.19.2022   1. Left ventricular ejection fraction, by estimation, is 40 to 45%. The  left ventricle has mildly decreased function. Left ventricular endocardial  border not optimally defined to evaluate regional wall motion. Left  ventricular diastolic parameters are  indeterminate.   2. Right ventricular systolic function is normal. The right ventricular  size is normal. Tricuspid regurgitation signal is inadequate for assessing  PA pressure.   3. Left atrial size was mildly dilated.   4. A small pericardial effusion is present. The pericardial effusion is  posterior to the left ventricle. Moderate pleural effusion in the left  lateral region.   5. The mitral valve is normal in structure. Mild mitral valve  regurgitation. No evidence of mitral stenosis.   6. The aortic valve is normal in structure. Aortic valve regurgitation is  not visualized. Aortic valve sclerosis/calcification is present,  without  any evidence of aortic stenosis.   7. Moderately dilated pulmonary artery.   8. The inferior vena cava is dilated in size with <50% respiratory  variability, suggesting right atrial pressure of 15 mmHg.  _____________   Patient Profile     75 y.o. 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 fib on Eliquis, ESRD on HD, hypotension, DM2, anemia of chronic disease, morbid obesity presented with low-grade fever and chills with mental status changes and reported abdominal pain.  He was found to have elevated troponin.  Assessment & Plan    1.  Coronary artery disease/demand ischemia: Elevated troponins of 2400 in the setting of fever, chills, abdominal pain, and altered mental status.  Diagnostic catheterization in July 2022 showed severe second obtuse marginal disease and he underwent drug-eluting stent placement.  Echo this admission with an EF of 40 to 45%, which is down slightly from 45 to 50% in July.  He continues to note dyspnea, which appears to be chronic but  has not been having chest pain.  Suspect demand ischemia.  Will not plan ischemic evaluation at this time.  We discontinued clopidogrel therapy on December 20 in the setting of thrombocytopenia.  Continue statin therapy.  Low-dose beta-blocker was added yesterday  2.  Permanent atrial fibrillation: Rate reasonably controlled on low-dose metoprolol.  Follow blood pressures closely as he continues to require midodrine.  He is anticoagulated with Eliquis-as previously noted, any further drop in platelets, and we would consider discontinuation of Eliquis.  3.  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  Volume management per nephrology.  Heart rate and blood pressure relatively stable.  4.  Recent gallstone pancreatitis: Status post ERCP with stenting.  5.  End-stage renal disease: Hemodialysis per nephrology.  6.   Normocytic anemia/thrombocytopenia: Stable.  Clopidogrel discontinued December 20.  Signed, Murray Hodgkins, NP  02/24/2021, 3:40 PM    For questions or updates, please contact   Please consult [email or web address] for contact info under Cardiology/STEMI.

## 2021-02-24 NOTE — Progress Notes (Signed)
[facility]  ROUNDING NOTE   Subjective:   Antonio Phillips is a 75 year old male with past medical history including hypotension, obesity, peripheral artery disease, diabetes, hyperlipidemia, and end-stage renal disease on dialysis.  Patient presents to emergency department with abdominal pain persisting for 2 days.  Patient admitted for further evaluation for NSTEMI, initial episode of care ([place]) [I21.4] Fever, unspecified fever cause [R50.9] Altered mental status, unspecified altered mental status type [J47.82] Acute metabolic encephalopathy [N56.21] Acute congestive heart failure, unspecified heart failure type ([place]) [I50.9]  Update Patient seen resting in bed, alert and oriented Tolerating meals without nausea and vomiting Denies shortness of breath  Dialysis received yesterday, tolerated well Patient states he was not treated well by dialysis staff yesterday   Objective:  Vital signs in last 24 hours:  Temp:  [97.6 F (36.4 C)-99.2 F (37.3 C)] 98.3 F (36.8 C) (12/21 1512) Pulse Rate:  [26-155] 71 (12/21 1512) Resp:  [12-27] 17 (12/21 1512) BP: (85-127)/(48-100) 106/58 (12/21 1512) SpO2:  [81 %-100 %] 96 % (12/21 1512) FiO2 (%):  [2 %] 2 % (12/20 2200) Weight:  [99.7 kg-101.8 kg] 99.7 kg (12/21 0500)  Weight change: 4.8 kg Filed Weights   02/23/21 1531 02/23/21 2109 02/24/21 0500  Weight: 104 kg 101.8 kg 99.7 kg    Intake/Output: I/O last 3 completed shifts: In: 800 [P.O.:700; IV Piggyback:100] Out: 1500 [Other:1500]   Intake/Output this shift:  Total I/O In: 960 [P.O.:960] Out: 100 [Urine:100]  Physical Exam: General: NAD, resting comfortably  Head: Normocephalic, atraumatic. Moist oral mucosal membranes  Eyes: Anicteric  Lungs:  Basilar wheeze, normal effort  Heart: Irregular  Abdomen:  Soft, tender, mild distention  Extremities: Trace peripheral edema.  Right BKA left AKA  Neurologic: Nonfocal, moving all four extremities  Skin: No  lesions  Access: Left AVF    Basic Metabolic Panel: Recent Labs  Lab 02/20/21 1602 02/21/21 0347 02/22/21 0349 02/23/21 0556  NA 138 141 138 137  K 3.0* 3.2* 3.3* 4.1  CL 100 104 103 102  CO2 26 25 25 22   GLUCOSE 124* 93 134* 118*  BUN 24* 27* 44* 57*  CREATININE 3.94* 4.56* 5.51* 6.33*  CALCIUM 9.4 8.7* 8.3* 8.3*  MG  --  1.9 1.8 1.8  PHOS  --  3.8 4.5 5.6*     Liver Function Tests: Recent Labs  Lab 02/20/21 1602 02/23/21 0556  AST 18  --   ALT 9  --   ALKPHOS 150*  --   BILITOT 2.2*  --   PROT 6.8  --   ALBUMIN 3.1* 2.3*    No results for input(s): LIPASE, AMYLASE in the last 168 hours.  Recent Labs  Lab 02/21/21 0042  AMMONIA 22     CBC: Recent Labs  Lab 02/20/21 1602 02/21/21 0347 02/23/21 0556 02/24/21 0942  WBC 5.4 5.1 4.8 5.6  NEUTROABS 4.0  --   --  2.8  HGB 11.3* 10.3* 8.7* 9.2*  HCT 36.7* 34.1* 28.2* 29.2*  MCV 97.6 100.3* 98.9 97.7  PLT 68* 61* 55* 60*     Cardiac Enzymes: No results for input(s): CKTOTAL, CKMB, CKMBINDEX, TROPONINI in the last 168 hours.  BNP: Invalid input(s): POCBNP  CBG: Recent Labs  Lab 02/21/21 0115  GLUCAP 105*     Microbiology: Results for orders placed or performed during the hospital encounter of 02/20/21  Resp Panel by RT-PCR (Flu A&B, Covid) Nasopharyngeal Swab     Status: None   Collection Time: 02/20/21  2:49 PM   Specimen: Nasopharyngeal Swab; Nasopharyngeal(NP) swabs in vial transport medium  Result Value Ref Range Status   SARS Coronavirus 2 by RT PCR NEGATIVE NEGATIVE Final    Comment: (NOTE) SARS-CoV-2 target nucleic acids are NOT DETECTED.  The SARS-CoV-2 RNA is generally detectable in upper respiratory specimens during the acute phase of infection. The lowest concentration of SARS-CoV-2 viral copies this assay can detect is 138 copies/mL. A negative result does not preclude SARS-Cov-2 infection and should not be used as the sole basis for treatment or other patient management  decisions. A negative result may occur with  improper specimen collection/handling, submission of specimen other than nasopharyngeal swab, presence of viral mutation(s) within the areas targeted by this assay, and inadequate number of viral copies(<138 copies/mL). A negative result must be combined with clinical observations, patient history, and epidemiological information. The expected result is Negative.  Fact Sheet for Patients:  EntrepreneurPulse.com.au  Fact Sheet for Healthcare Providers:  IncredibleEmployment.be  This test is no t yet approved or cleared by the [place] FDA and  has been authorized for detection and/or diagnosis of SARS-CoV-2 by FDA under an Emergency Use Authorization (EUA). This EUA will remain  in effect (meaning this test can be used) for the duration of the COVID-19 declaration under Section 564(b)(1) of the Act, 21 U.S.C.section 360bbb-3(b)(1), unless the authorization is terminated  or revoked sooner.       Influenza A by PCR NEGATIVE NEGATIVE Final   Influenza B by PCR NEGATIVE NEGATIVE Final    Comment: (NOTE) The Xpert Xpress SARS-CoV-2/FLU/RSV plus assay is intended as an aid in the diagnosis of influenza from Nasopharyngeal swab specimens and should not be used as a sole basis for treatment. Nasal washings and aspirates are unacceptable for Xpert Xpress SARS-CoV-2/FLU/RSV testing.  Fact Sheet for Patients: EntrepreneurPulse.com.au  Fact Sheet for Healthcare Providers: IncredibleEmployment.be  This test is not yet approved or cleared by the [place] FDA and has been authorized for detection and/or diagnosis of SARS-CoV-2 by FDA under an Emergency Use Authorization (EUA). This EUA will remain in effect (meaning this test can be used) for the duration of the COVID-19 declaration under Section 564(b)(1) of the Act, 21 U.S.C. section 360bbb-3(b)(1), unless the  authorization is terminated or revoked.  Performed at [facility], [address] [place], [place] [address]   Culture, blood (single)     Status: None (Preliminary result)   Collection Time: 02/20/21  9:05 PM   Specimen: BLOOD  Result Value Ref Range Status   Specimen Description BLOOD BLOOD RIGHT FOREARM  Final   Special Requests   Final    BOTTLES DRAWN AEROBIC AND ANAEROBIC Blood Culture results may not be optimal due to an excessive volume of blood received in culture bottles   Culture   Final    NO GROWTH 4 DAYS Performed at [facility], [address], [place], [place] [address]    Report Status PENDING  Incomplete  MRSA Next Gen by PCR, Nasal     Status: None   Collection Time: 02/21/21  7:39 AM   Specimen: Nasal Mucosa; Nasal Swab  Result Value Ref Range Status   MRSA by PCR Next Gen NOT DETECTED NOT DETECTED Final    Comment: (NOTE) The GeneXpert MRSA Assay (FDA approved for NASAL specimens only), is one component of a comprehensive MRSA colonization surveillance program. It is not intended to diagnose MRSA infection nor to guide or monitor treatment for MRSA infections. Test performance  is not FDA approved in patients less than 60 years old. Performed at [facility], [address] [place], [place] [address]     Coagulation Studies: No results for input(s): LABPROT, INR in the last 72 hours.   Urinalysis: No results for input(s): COLORURINE, LABSPEC, PHURINE, GLUCOSEU, HGBUR, BILIRUBINUR, KETONESUR, PROTEINUR, UROBILINOGEN, NITRITE, LEUKOCYTESUR in the last 72 hours.  Invalid input(s): APPERANCEUR    Imaging: CT PELVIS W CONTRAST  Result Date: 02/24/2021 CLINICAL DATA:  Right hip drainage.  Evaluate for infection. EXAM: CT PELVIS WITH CONTRAST TECHNIQUE: Multidetector CT imaging of the pelvis was performed using the standard protocol following the bolus administration of intravenous contrast. CONTRAST:  163mL OMNIPAQUE  IOHEXOL 300 MG/ML  SOLN COMPARISON:  CT chest, abdomen, and pelvis dated February 20, 2021. FINDINGS: Urinary Tract:  Underdistended bladder. Bowel: Small bowel anastomosis again noted. Extensive sigmoid colonic diverticulosis again noted. Vascular/Lymphatic: Aortoiliac atherosclerotic vascular disease. Unchanged small reactive bilateral iliac and inguinal lymph nodes. Reproductive: The prostate is obscured by streak artifact from hip arthroplasties. Other:  Diffuse anasarca. Musculoskeletal: Prior bilateral total hip arthroplasties. No evidence of hardware failure or loosening. Suspected right hip joint effusion with joint fluid bulging along the medial margin of the inferior acetabulum (series 8, image 89). Incompletely visualized 3.0 x 1.9 x 9.7 cm rim enhancing fluid collection with a tiny focus of air along the lateral aspect of the vastus lateralis muscle tracking superiorly adjacent to the greater trochanter. This appears to drain to the lateral skin surface via an ill-defined sinus tract (series 3, image 91). IMPRESSION: 1. Incompletely visualized 3.0 x 1.9 x 9.7 cm rim enhancing fluid collection with a tiny focus of air along the lateral aspect of the vastus lateralis muscle tracking superiorly adjacent to the greater trochanter, concerning for abscess. This appears to drain to the lateral skin surface via an ill-defined sinus tract. 2. Suspected small right hip joint effusion with joint fluid bulging along the medial margin of the inferior acetabulum. 3. Prior bilateral total hip arthroplasties without evidence of hardware failure or loosening. 4. Diffuse anasarca. 5. Aortic Atherosclerosis (ICD10-I70.0). Electronically Signed   By: Titus Dubin M.D.   On: 02/24/2021 12:21     Medications:      apixaban  5 mg Oral BID   atorvastatin  40 mg Oral QHS   epoetin (EPOGEN/PROCRIT) injection  6,000 Units Intravenous Q T,Th,Sa-HD   feeding supplement (NEPRO CARB STEADY)  237 mL Oral TID BM    finasteride  5 mg Oral Daily   lidocaine  1 patch Transdermal Q24H   metoprolol tartrate  12.5 mg Oral BID   midodrine  10 mg Oral TID WC   multivitamin  1 tablet Oral QHS   rOPINIRole  1 mg Oral QHS   sevelamer carbonate  1,600 mg Oral Once per day on Sun Mon Wed Fri   sevelamer carbonate  1,600 mg Oral BID WC   sodium chloride flush  10 mL Intravenous Q12H   vitamin B-12  1,000 mcg Oral Daily      Assessment/ Plan:  Antonio Phillips is a 75 y.o.  male with past medical history including hypotension, obesity, peripheral artery disease, diabetes, hyperlipidemia, and end-stage renal disease on dialysis.  Patient presents to emergency department with abdominal pain persisting for 2 days.  Patient admitted for further evaluation for NSTEMI, initial episode of care [facility]) [I21.4] Fever, unspecified fever cause [R50.9] Altered mental status, unspecified altered mental status type [D74.12] Acute metabolic encephalopathy [I78.67] Acute  congestive heart failure, unspecified heart failure type ([place]) [I50.9]  [facility]/TTS/Lt AVF/96kg   End-stage renal disease on HD TTS.  Patient received dialysis yesterday evening, UF 1.5 L achieved.  HD staff mention patient's increased confusion during treatment.  Stating patient even refused to have needles pulled at end of treatment.  Next treatment scheduled for Thursday.  2. Anemia of chronic kidney disease Lab Results  Component Value Date   HGB 9.2 (L) 02/24/2021    Hemoglobin 9.2..  Continue patient on Epogen 6000 IV with dialysis.  3. Secondary Hyperparathyroidism: Lab Results  Component Value Date   CALCIUM 8.3 (L) 02/23/2021   PHOS 5.6 (H) 02/23/2021  Phosphorus elevated but should correct with dialysis.  We will continue to monitor.  Calcium within acceptable range  4.  Fever.  Afebrile in previous 24 hours.  Receiving cefepime.   LOS: 3 Nelani Schmelzle 12/21/20225:13 PM

## 2021-02-24 NOTE — Hospital Course (Addendum)
75 year old skilled facility dwelling white male ESRD HD TTS with chronic hypotension on midodrine PVD right BKA left AKA DM TY 2 CAD status post DES to obtuse marginal Permanent A. fib CHADS2 score >4 Eliquis  Recent admission 11/27 through 12/6 choledocholithiasis status post ERCP-brief stepdown stay-patient had biliary duct dilatation removal of 1 stone received 7-day course of Zosyn That admission pertinent for acute superimposed on chronic thrombocytopenia felt secondary to acute illness-at that admission Lyrica dosage increased from 75 to 100 mg daily  Readmit to ICU on 02/21/2021 altered mental status Lactic acid 2.4 troponin 1557-->2463 BNP >4500 CT head negative Code sepsis called started broad-spectrum antibiotics-CT chest suggestive underlying pneumonia?  Aspiration vs UTI Nephrology consulted for HD, cardiology consulted Echo -12/19--- EF 40-45 % down from prior 60-65% in 09/2020   IMPRESSION: 1. Mild features of CHF, with increased small to moderate bilateral layering pleural effusions and with consolidation or atelectasis in the adjacent lower lobes. No other focal pulmonary abnormality. 2. Increased body wall anasarca and small-volume abdominal and pelvic ascites. 3. Cardiomegaly with aortic and coronary artery atherosclerosis, extensive vascular calcifications in the abdomen including visceral arteries. 4. Increased prominence of the pulmonary trunk.  No aortic aneurysm. 5. Significantly improved findings of pancreatitis with only minimal peripancreatic stranding today. 6. Since 01/31/2021, interval CBD stenting and resolution of choledocholithiasis and biliary dilatation. 7. Cholelithiasis with mild gallbladder thickening and pericholecystic fluid which was seen on both prior studies and could be congestive. Correlate clinically for cholecystitis. 8. Renal atrophy and cysts with 1.3 cm probable hyperdense cyst left superior pole. MRI or ultrasound recommended to  assess for internal complexity. 9. Possible cystitis. 10. Hip hardware obscuring visualization of the lower bladder, rectum and pelvic sidewalls. 11. Probable gastritis and additional findings discussed above. No intestinal dilatation or wall thickening. Diverticulosis.

## 2021-02-25 ENCOUNTER — Other Ambulatory Visit: Payer: Self-pay | Admitting: *Deleted

## 2021-02-25 DIAGNOSIS — T8450XA Infection and inflammatory reaction due to unspecified internal joint prosthesis, initial encounter: Secondary | ICD-10-CM

## 2021-02-25 DIAGNOSIS — D696 Thrombocytopenia, unspecified: Secondary | ICD-10-CM

## 2021-02-25 DIAGNOSIS — R4182 Altered mental status, unspecified: Secondary | ICD-10-CM

## 2021-02-25 DIAGNOSIS — L0291 Cutaneous abscess, unspecified: Secondary | ICD-10-CM

## 2021-02-25 DIAGNOSIS — L03116 Cellulitis of left lower limb: Secondary | ICD-10-CM

## 2021-02-25 DIAGNOSIS — Z96649 Presence of unspecified artificial hip joint: Secondary | ICD-10-CM

## 2021-02-25 DIAGNOSIS — T8484XD Pain due to internal orthopedic prosthetic devices, implants and grafts, subsequent encounter: Secondary | ICD-10-CM

## 2021-02-25 DIAGNOSIS — R509 Fever, unspecified: Secondary | ICD-10-CM

## 2021-02-25 LAB — CULTURE, BLOOD (SINGLE): Culture: NO GROWTH

## 2021-02-25 LAB — SEDIMENTATION RATE: Sed Rate: 25 mm/hr — ABNORMAL HIGH (ref 0–20)

## 2021-02-25 NOTE — TOC Initial Note (Signed)
Transition of Care [facility]) - Initial/Assessment Note    Patient Details  Name: Antonio Phillips MRN: [id number] Date of Birth: 1945/08/12  [facility] CM/SW Contact:    Alberteen Sam, LCSW Phone Number: 02/25/2021, 4:17 PM  Clinical Narrative:                  Per Neoma Laming at [facility] patient is long term care resident there. Plans to return at dc.   TOC continuing to follow for discharge readiness.    Expected Discharge Plan: Skilled Nursing Facility Barriers to Discharge: Continued Medical Work up   Patient Goals and CMS Choice   CMS Medicare.gov Compare Post Acute Care list provided to:: Patient Choice offered to / list presented to : Patient  Expected Discharge Plan and Services Expected Discharge Plan: [place]                                              Prior Living Arrangements/Services                       Activities of Daily Living Home Assistive Devices/Equipment: CBG Meter, Prosthesis, Wheelchair ADL Screening (condition at time of admission) Patient's cognitive ability adequate to safely complete daily activities?: Yes Is the patient deaf or have difficulty hearing?: No Does the patient have difficulty seeing, even when wearing glasses/contacts?: No Does the patient have difficulty concentrating, remembering, or making decisions?: No Patient able to express need for assistance with ADLs?: Yes Does the patient have difficulty dressing or bathing?: Yes Independently performs ADLs?: No Communication: Independent Dressing (OT): Needs assistance Is this a change from baseline?: Pre-admission baseline Grooming: Needs assistance Is this a change from baseline?: Pre-admission baseline Feeding: Independent Is this a change from baseline?: Pre-admission baseline Bathing: Needs assistance Is this a change from baseline?: Pre-admission baseline Toileting: Needs assistance Is this a change from baseline?:  Pre-admission baseline In/Out Bed: Needs assistance Is this a change from baseline?: Pre-admission baseline Walks in Home: Needs assistance Is this a change from baseline?: Pre-admission baseline Does the patient have difficulty walking or climbing stairs?: Yes Weakness of Legs: Both Weakness of Arms/Hands: None  Permission Sought/Granted                  Emotional Assessment         Alcohol / Substance Use: Not Applicable Psych Involvement: No (comment)  Admission diagnosis:  NSTEMI, initial episode of care ([place]) [I21.4] Fever, unspecified fever cause [R50.9] Altered mental status, unspecified altered mental status type [S06.30] Acute metabolic encephalopathy [Z60.10] Acute congestive heart failure, unspecified heart failure type ([place]) [I50.9] Patient Active Problem List   Diagnosis Date Noted   Longstanding persistent atrial fibrillation ([place])    Acute metabolic encephalopathy 93/23/5573   Pain    Normocytic anemia    Atrial fibrillation with rapid ventricular response ([place])    Preop cardiovascular exam    Abdominal pain 01/31/2021   Choledocholithiasis 01/31/2021   Morbid obesity ([place])    Sepsis due to Escherichia coli ([place])    Wound of skin    Chronic hypotension    Demand ischemia ([place])    Permanent atrial fibrillation ([place])    Chest pain 09/26/2020   PVD (peripheral vascular disease) ([place])    Shortness of breath 09/20/2020   NSTEMI, initial episode of care [facility])  09/20/2020   Fungal skin infection    Atrial fibrillation, chronic (HCC)    Acquired thrombophilia ([place])    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[place])    Anemia of chronic disease    Thrombocytopenia ([place])    Sacral wound 01/13/2020   Pressure injury of skin 01/13/2020   Generalized weakness 01/13/2020   ESRD (end stage renal disease) ([place]) 04/25/2018   PCP:  Marsh Dolly, MD Pharmacy:   [facility], [place]  [address] Phone: [phone] Fax: [place], [place] [address] Phone: [phone] Fax: [phone]     Social Determinants of Health (SDOH) Interventions    Readmission Risk Interventions Readmission Risk Prevention Plan 09/10/2019 11/14/2018 11/09/2018  Transportation Screening Complete Complete Complete  PCP or Specialist Appt within 3-5 Days Patient refused Patient refused -  [place] or [place] Patient refused - -  Social Work Consult for [place] Planning/Counseling Complete - -  [place] Not Applicable Not Applicable -  Medication Review (RN Care Manager) Complete Complete Complete  Some recent data might be hidden

## 2021-02-25 NOTE — Consult Note (Signed)
NAME: Antonio Phillips  DOB: [birth year]  MRN: [id number]  Date/Time: 02/25/2021 12:01 PM  REQUESTING PROVIDER: Dr.Samtani Subjective:  REASON FOR CONSULT: recent sepsis- now rt hip fluid ? Antonio Phillips is a 75 y.o. male with a history of ESRD on HD, CAD s/p  stents, left AKA, rt BKA, B/l THA , Afib on eliquis, presents to the hospital from HD because he was very lethargic. Pt says he has been having chills at home In the ED temp 100.4, 134/90, pulse 115, sats 88% Wbc 5.4, PLT 68, BNP > 4500, Troponin 1557. CXR showed cardiomegaly, interstitial pulmonary edema . CT chest , abd, pelvis b/l pleural effusion increased body wall anasarca. Improved pancreatitis, resolution of choledocholithiasis and biliary dilatation.  Blood culture sent and he was diagnosed with sepsis and started on broad spectrum antibiotics-vanco, cefepime and metronidazole Pt also has a sinus discharging fluid at the site of rt femur scar. He says he has had it for more than a month and had an appt to see his ortho at [place] this week  As pt was doing fine with no fever or wbc and blood culture was neg antibiotic was stopped after 48 hrs Pt had CT pelvis which showed 3.0 x 1.9 x 9.7 cm rim enhancing fluid collection with a tiny focus of air along the lateral aspect of the vastus lateralis muscle tracking superiorly adjacent to the greater trochanter, concerning for abscess. This appears to drain to the lateral skin surface via an ill-defined sinus tract. 2. Suspected small right hip joint effusion with joint fluid bulging along the medial margin of the inferior acetabulum.3. Prior bilateral total hip arthroplasties without evidence of hardware failure or loosening. He also  c/o pain and swelling left thigh Seen by Dr.Menz who recommends treating with antibiotics and follow up with his ortho. I am asked to see the patient for the same   Pt was recently in [facility] between 01/31/21 to 02/09/21 for acute pancreatitis,  choledocholithiasis, had e.coli bacteremia, underwent ERCP and CBD plastic stent placement on 02/04/21. During that hospitalization he received 7 days of Iv zosyn   Past Medical History:  Diagnosis Date   Demand ischemia ([place])    a. 11/2017 elev Trop in setting of AFib/SVT-->Echo [facility]): EF>55%. Mild LVH. Nl RV fxn.   Diabetes ([place])    a. Pt states that he has no hx of diabetes--A1c 5.5 11/2018.   ESRD (end stage renal disease) ([place])    a. On HD since 2019.   Hyperlipidemia    Hypotension    a. On midodrine.   Morbid obesity ([place])    PAD (peripheral artery disease) ([place])    a. s/p L AKA and R BKA.   Permanent atrial fibrillation ([place])    a. Dx 2019. CHA2DS2VASc = 3-4-->Eliquis.    Past Surgical History:  Procedure Laterality Date   AV FISTULA REPAIR     BELOW KNEE LEG AMPUTATION Bilateral    CHOLECYSTECTOMY     ERCP N/A 02/04/2021   Procedure: ENDOSCOPIC RETROGRADE CHOLANGIOPANCREATOGRAPHY (ERCP);  Surgeon: Lucilla Lame, MD;  Location: [facility] ENDOSCOPY;  Service: Endoscopy;  Laterality: N/A;   LEFT HEART CATH AND CORONARY ANGIOGRAPHY N/A 09/22/2020   Procedure: LEFT HEART CATH AND CORONARY ANGIOGRAPHY;  Surgeon: Sherren Mocha, MD;  Location: [place] CV LAB;  Service: Cardiovascular;  Laterality: N/A;   TOTAL HIP ARTHROPLASTY Bilateral     Social History   Socioeconomic History   Marital status: Single    Spouse name: Not on file  Number of children: Not on file   Years of education: Not on file   Highest education level: Not on file  Occupational History   Not on file  Tobacco Use   Smoking status: Never   Smokeless tobacco: Never  Vaping Use   Vaping Use: Never used  Substance and Sexual Activity   Alcohol use: Never   Drug use: Never   Sexual activity: Not on file  Other Topics Concern   Not on file  Social History Narrative   Lives @ local nsg facility.  Does not routinely exercise.   Social Determinants of Health   Financial Resource Strain: Not on file   Food Insecurity: Not on file  Transportation Needs: Not on file  Physical Activity: Not on file  Stress: Not on file  Social Connections: Not on file  Intimate Partner Violence: Not on file    History reviewed. No pertinent family history. Allergies  Allergen Reactions   Simvastatin Other (See Comments)   I? Current Facility-Administered Medications  Medication Dose Route Frequency Provider Last Rate Last Admin   apixaban (ELIQUIS) tablet 5 mg  5 mg Oral BID Fritzi Mandes, MD   5 mg at 02/25/21 0820   atorvastatin (LIPITOR) tablet 40 mg  40 mg Oral QHS Fritzi Mandes, MD   40 mg at 02/24/21 2242   docusate sodium (COLACE) capsule 100 mg  100 mg Oral BID PRN Lang Snow, NP   100 mg at 02/23/21 2200   epoetin alfa (EPOGEN) injection 6,000 Units  6,000 Units Intravenous Q T,Th,Sa-HD Lateef, Munsoor, MD   6,000 Units at 02/25/21 1059   feeding supplement (NEPRO CARB STEADY) liquid 237 mL  237 mL Oral TID BM Ottie Glazier, MD   237 mL at 02/25/21 0820   finasteride (PROSCAR) tablet 5 mg  5 mg Oral Daily Fritzi Mandes, MD   5 mg at 02/25/21 6948   HYDROcodone-acetaminophen (NORCO/VICODIN) 5-325 MG per tablet 1 tablet  1 tablet Oral Q6H PRN Ottie Glazier, MD   1 tablet at 02/25/21 0513   HYDROmorphone (DILAUDID) injection 1 mg  1 mg Intravenous Q4H PRN Ottie Glazier, MD   1 mg at 02/23/21 0133   lidocaine (LIDODERM) 5 % 1 patch  1 patch Transdermal Q24H Ottie Glazier, MD   1 patch at 02/25/21 5462   metoprolol tartrate (LOPRESSOR) tablet 12.5 mg  12.5 mg Oral BID Fritzi Mandes, MD   12.5 mg at 02/24/21 2242   midodrine (PROAMATINE) tablet 10 mg  10 mg Oral TID WC Fritzi Mandes, MD   10 mg at 02/25/21 0820   multivitamin (RENA-VIT) tablet 1 tablet  1 tablet Oral QHS Ottie Glazier, MD   1 tablet at 02/24/21 2243   polyethylene glycol (MIRALAX / GLYCOLAX) packet 17 g  17 g Oral Daily PRN Lang Snow, NP       rOPINIRole (REQUIP) tablet 1 mg  1 mg Oral QHS Fritzi Mandes, MD    1 mg at 02/24/21 2243   sevelamer carbonate (RENVELA) tablet 1,600 mg  1,600 mg Oral Once per day on Sun Mon Wed Fri Dorothe Pea, RPH   1,600 mg at 02/24/21 1714   sevelamer carbonate (RENVELA) tablet 1,600 mg  1,600 mg Oral BID WC Dorothe Pea, RPH   1,600 mg at 02/25/21 7035   sodium chloride flush (NS) 0.9 % injection 10 mL  10 mL Intravenous Q12H Fritzi Mandes, MD   10 mL at 02/25/21 0093   vitamin B-12 (CYANOCOBALAMIN)  tablet 1,000 mcg  1,000 mcg Oral Daily Fritzi Mandes, MD   1,000 mcg at 02/25/21 7001     Abtx:  Anti-infectives (From admission, onward)    Start     Dose/Rate Route Frequency Ordered Stop   02/23/21 1200  vancomycin (VANCOCIN) IVPB 1000 mg/200 mL premix  Status:  Discontinued        1,000 mg 200 mL/hr over 60 Minutes Intravenous Every T-Th-Sa (Hemodialysis) 02/21/21 0259 02/22/21 1057   02/22/21 2100  ceFEPIme (MAXIPIME) 1 g in sodium chloride 0.9 % 100 mL IVPB  Status:  Discontinued        1 g 200 mL/hr over 30 Minutes Intravenous Every 24 hours 02/21/21 0259 02/23/21 1426   02/20/21 2200  vancomycin (VANCOREADY) IVPB 1500 mg/300 mL       See Hyperspace for full Linked Orders Report.   1,500 mg 150 mL/hr over 120 Minutes Intravenous  Once 02/20/21 2103 02/21/21 0428   02/20/21 2115  vancomycin (VANCOCIN) IVPB 1000 mg/200 mL premix       See Hyperspace for full Linked Orders Report.   1,000 mg 200 mL/hr over 60 Minutes Intravenous  Once 02/20/21 2103 02/21/21 0131   02/20/21 2100  ceFEPIme (MAXIPIME) 2 g in sodium chloride 0.9 % 100 mL IVPB        2 g 200 mL/hr over 30 Minutes Intravenous  Once 02/20/21 2059 02/20/21 2311   02/20/21 2100  metroNIDAZOLE (FLAGYL) IVPB 500 mg        500 mg 100 mL/hr over 60 Minutes Intravenous  Once 02/20/21 2059 02/20/21 2230   02/20/21 2100  vancomycin (VANCOCIN) IVPB 1000 mg/200 mL premix  Status:  Discontinued        1,000 mg 200 mL/hr over 60 Minutes Intravenous  Once 02/20/21 2059 02/20/21 2103       REVIEW OF  SYSTEMS:  Const: no fever now but had chills and fever last week, negative weight loss Eyes: negative diplopia or visual changes, negative eye pain ENT: negative coryza, negative sore throat Resp: negative cough, hemoptysis, dyspnea Cards: negative for chest pain, palpitations, ++ edema GU: negative for frequency, dysuria and hematuria GI: has abdominal pain Skin: negative for rash and pruritus Heme: negative for easy bruising and gum/nose bleeding MS: weakness, b/l thigh pain- left more than right Neurolo:negative for headaches, dizziness, vertigo, memory problems  Psych: negative for feelings of anxiety, depression  Endocrine: negative for thyroid, diabetes Allergy/Immunology- simvastatin Objective:  VITALS:  BP (!) 92/47    Pulse 69    Temp 97.9 F (36.6 C) (Oral)    Resp 13    Ht 5\' 11"  (1.803 m)    Wt 101.6 kg    SpO2 99%    BMI 31.24 kg/m  PHYSICAL EXAM:  General: Alert, cooperative, no distress, appears stated age.  Head: Normocephalic, without obvious abnormality, atraumatic. Eyes: Conjunctivae clear, anicteric sclerae. Pupils are equal ENT Nares normal. No drainage or sinus tenderness. Lips, mucosa, and tongue normal. No Thrush Neck: Supple, symmetrical, no adenopathy, thyroid: non tender no carotid bruit and no JVD. Back: No CVA tenderness. Lungs:b/l air entry Decreased air entry bases . Heart: irregular - rate controlled Abdomen: Soft, epigastric/umbilical hernia Tenderness over that area Extremities: rt thigh- surgical scar- sinus seen Left thigh swollen with some erythema posteriorly A dry wound left AKA stump      Skin: as above Lymph: Cervical, supraclavicular normal. Neurologic: Grossly non-focal Pertinent Labs       Lab Results CBC  Component Value Date/Time   WBC 5.6 02/24/2021 0942   RBC 2.99 (L) 02/24/2021 0942   HGB 9.2 (L) 02/24/2021 0942   HGB 15.2 11/14/2012 2045   HCT 29.2 (L) 02/24/2021 0942   HCT 45.0 11/14/2012 2045   PLT 60  (L) 02/24/2021 0942   PLT 116 (L) 11/14/2012 2045   MCV 97.7 02/24/2021 0942   MCV 95 11/14/2012 2045   MCH 30.8 02/24/2021 0942   MCHC 31.5 02/24/2021 0942   RDW 20.7 (H) 02/24/2021 0942   RDW 15.2 (H) 11/14/2012 2045   LYMPHSABS 2.0 02/24/2021 0942   MONOABS 0.6 02/24/2021 0942   EOSABS 0.1 02/24/2021 0942   BASOSABS 0.0 02/24/2021 0942    CMP Latest Ref Rng & Units 02/23/2021 02/22/2021 02/21/2021  Glucose 70 - 99 mg/dL 118(H) 134(H) 93  BUN 8 - 23 mg/dL 57(H) 44(H) 27(H)  Creatinine 0.61 - 1.24 mg/dL 6.33(H) 5.51(H) 4.56(H)  Sodium 135 - 145 mmol/L 137 138 141  Potassium 3.5 - 5.1 mmol/L 4.1 3.3(L) 3.2(L)  Chloride 98 - 111 mmol/L 102 103 104  CO2 22 - 32 mmol/L 22 25 25   Calcium 8.9 - 10.3 mg/dL 8.3(L) 8.3(L) 8.7(L)  Total Protein 6.5 - 8.1 g/dL - - -  Total Bilirubin 0.3 - 1.2 mg/dL - - -  Alkaline Phos 38 - 126 U/L - - -  AST 15 - 41 U/L - - -  ALT 0 - 44 U/L - - -      Microbiology: Recent Results (from the past 240 hour(s))  Resp Panel by RT-PCR (Flu A&B, Covid) Nasopharyngeal Swab     Status: None   Collection Time: 02/20/21  2:49 PM   Specimen: Nasopharyngeal Swab; Nasopharyngeal(NP) swabs in vial transport medium  Result Value Ref Range Status   SARS Coronavirus 2 by RT PCR NEGATIVE NEGATIVE Final    Comment: (NOTE) SARS-CoV-2 target nucleic acids are NOT DETECTED.  The SARS-CoV-2 RNA is generally detectable in upper respiratory specimens during the acute phase of infection. The lowest concentration of SARS-CoV-2 viral copies this assay can detect is 138 copies/mL. A negative result does not preclude SARS-Cov-2 infection and should not be used as the sole basis for treatment or other patient management decisions. A negative result may occur with  improper specimen collection/handling, submission of specimen other than nasopharyngeal swab, presence of viral mutation(s) within the areas targeted by this assay, and inadequate number of viral copies(<138  copies/mL). A negative result must be combined with clinical observations, patient history, and epidemiological information. The expected result is Negative.  Fact Sheet for Patients:  EntrepreneurPulse.com.au  Fact Sheet for Healthcare Providers:  IncredibleEmployment.be  This test is no t yet approved or cleared by the [place] FDA and  has been authorized for detection and/or diagnosis of SARS-CoV-2 by FDA under an Emergency Use Authorization (EUA). This EUA will remain  in effect (meaning this test can be used) for the duration of the COVID-19 declaration under Section 564(b)(1) of the Act, 21 U.S.C.section 360bbb-3(b)(1), unless the authorization is terminated  or revoked sooner.       Influenza A by PCR NEGATIVE NEGATIVE Final   Influenza B by PCR NEGATIVE NEGATIVE Final    Comment: (NOTE) The Xpert Xpress SARS-CoV-2/FLU/RSV plus assay is intended as an aid in the diagnosis of influenza from Nasopharyngeal swab specimens and should not be used as a sole basis for treatment. Nasal washings and aspirates are unacceptable for Xpert Xpress SARS-CoV-2/FLU/RSV testing.  Fact Sheet for Patients: EntrepreneurPulse.com.au  Fact Sheet for Healthcare Providers: IncredibleEmployment.be  This test is not yet approved or cleared by the [place] FDA and has been authorized for detection and/or diagnosis of SARS-CoV-2 by FDA under an Emergency Use Authorization (EUA). This EUA will remain in effect (meaning this test can be used) for the duration of the COVID-19 declaration under Section 564(b)(1) of the Act, 21 U.S.C. section 360bbb-3(b)(1), unless the authorization is terminated or revoked.  Performed at [facility], [address] [place], [place] [address]   Culture, blood (single)     Status: None   Collection Time: 02/20/21  9:05 PM   Specimen: BLOOD  Result Value Ref Range Status    Specimen Description BLOOD BLOOD RIGHT FOREARM  Final   Special Requests   Final    BOTTLES DRAWN AEROBIC AND ANAEROBIC Blood Culture results may not be optimal due to an excessive volume of blood received in culture bottles   Culture   Final    NO GROWTH 5 DAYS Performed at [facility], [address], [place], [place] [address]    Report Status 02/25/2021 FINAL  Final  MRSA Next Gen by PCR, Nasal     Status: None   Collection Time: 02/21/21  7:39 AM   Specimen: Nasal Mucosa; Nasal Swab  Result Value Ref Range Status   MRSA by PCR Next Gen NOT DETECTED NOT DETECTED Final    Comment: (NOTE) The GeneXpert MRSA Assay (FDA approved for NASAL specimens only), is one component of a comprehensive MRSA colonization surveillance program. It is not intended to diagnose MRSA infection nor to guide or monitor treatment for MRSA infections. Test performance is not FDA approved in patients less than 20 years old. Performed at [facility], [address] [place], [place] [address]     IMAGING RESULTS:  I have personally reviewed the films  ?CT pelvis 3.0 x 1.9 x 9.7 cm rim enhancing fluid collection with a tiny focus of air along the lateral aspect of the vastus lateralis muscle tracking superiorly adjacent to the greater trochanter, concerning for abscess. This appears to drain to the lateral skin surface via an ill-defined sinus tract. 2. Suspected small right hip joint effusion with joint fluid bulging along the medial margin of the inferior acetabulum.3. Prior bilateral total hip arthroplasties without evidence of hardware failure or loosening.   Impression/Recommendation ?75 yr male presented with lethargy from HD   Was thought to have sepsis- source was not clear- CT abdomen showed cystitis but no urine was sent and he is a dialysis patient who doe snot make much urine  Now he has a rt thigh surgical site sinus and need to r/o rt PJI- The CT showed a  collection- IR aspiration has been ordered He also has left thigh swelling and mild erythema - is this cellulitis/ or deep tissue infection from the AKA stump wound. He needs imaging of the left thigh- CT scan As he has no fever or wbc and very stable will hold off antibiotics until we have more information in the way of imaging, culture IF his status changes like fever, tachycardia, hypotension or worsening pain or swelling or erythema left thigh then would send repeat blood culture and start broad spectrum antibiotics- vanco/cefepime Recent E.coli bacteremia due to choledocholithiasis, acute pancreatitis s/p ERCP and CBD stent placement on 02/04/21. Repeat CT shows resolution of CBD dilatation  Rt AKA- stump fine Left BKA stump dry wound  CHF Anasarca  ESRD on HD  CAD  _________________________________________________ Discussed with patient, requesting provider Note:  This document was prepared using Dragon voice recognition software and may include unintentional dictation errors.

## 2021-02-25 NOTE — Progress Notes (Addendum)
PROGRESS NOTE   Antonio Phillips  NIO:[id number] DOB: [birth year] DOA: 02/20/2021 PCP: Marsh Dolly, MD  Brief Narrative:  75 year old skilled facility dwelling white male ESRD HD TTS with chronic hypotension on midodrine PVD right BKA left AKA DM TY 2 CAD status post DES to obtuse marginal Permanent A. fib CHADS2 score >4 Eliquis  Recent admission 11/27 through 12/6 choledocholithiasis status post ERCP-brief stepdown stay-patient had biliary duct dilatation removal of 1 stone received 7-day course of Zosyn That admission pertinent for acute superimposed on chronic thrombocytopenia felt secondary to acute illness-at that admission Lyrica dosage increased from 75 to 100 mg daily  Readmit to ICU on 02/21/2021 altered mental status Lactic acid 2.4 troponin 1557-->2463 BNP >4500 CT head negative Code sepsis called started broad-spectrum antibiotics-CT chest not suggestive pneumonia-thought to have  UTI Nephrology consulted for HD, cardiology consulted Echo -12/19--- EF 40-45 % down from prior 60-65% in 09/2020  Hospital-Problem based course  Toxic metabolic encephalopathy secondary to possible infection Mental status is stable-- note patient had been recently placed on higher dosing of Lyrica Careful dosing Dilaudid 1 mg every 4 as needed, Norco for mild to moderate pain 9 cm X 2 cm X 3 cm abscess of right hip Probable low-grade DIC picture with 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 fib CHADS2 score >4 HFrEF t-Echo this admission EF 40-45% (similar to 09/2020) continue low-dose of metoprolol 12.5 twice daily Patient is on Eliquis which may need to be held if attempting Ir procedure Chronic respiratory failure?  Present on admission Patient states has been using oxygen for the past month-we will get a desat screen to see if he actually requires ESRD TTS Continuing Renvela 1.6 twice daily meals, erythropoietin TTS Continuing midodrine 10 tid to maintain blood pressure Rest as per renal PVD with multiple amputations and hip repairs Ideally with secondary prevention would require Plavix Needs outpatient coordination once evaluated in the outpatient at [facility] to determine when this can be resumed Thrombocytopenia?  Acquired-previous baseline 100s currently 20s to 28s Seen by Dr. Janese Banks 02/02/2021 Labs  indicate DIC based onimmature platelet function 12.6, LDH 88 which may be concerning for the same Gallstone pancreatitis Had plastic stenting on ERCP 12/1-plastic stent was placed and it was recommended to repeat ERCP to remove the stent off of anticoagulation- [facility] Gastroenterology Dr. Allen Norris on d/c to ensure not lost to follow-up  DVT prophylaxis: Apixaban Code Status: Full Family Communication: None present Disposition:  Status is: Inpatient Further work-up of Hip pathology   Consultants:  Cardiology  Procedures:   Antimicrobials: Cefepime metronidazole 12/17 through 12/19   Subjective:  Coherent pleasant no distress Complaining more of left pain and stump pain than right hip pain Not on oxygen at this time but does needed it seems at night No fever no chills  Objective: Vitals:   02/24/21 2016 02/25/21 0041 02/25/21 0518 02/25/21 0752  BP: 114/78 107/76 104/72 (!) 100/58  Pulse: (!) 110 83 88 79  Resp: 18 20 18 15   Temp: 98.4 F (36.9 C) 97.7 F (36.5 C) (!)  97.5 F (36.4 C) 98 F (36.7 C)  TempSrc: Oral  Oral Oral  SpO2:  95% 100% 99% 90%  Weight:      Height:        Intake/Output Summary (Last 24 hours) at 02/25/2021 0912 Last data filed at 02/24/2021 2244 Gross per 24 hour  Intake 730 ml  Output --  Net 730 ml    Filed Weights   02/23/21 1531 02/23/21 2109 02/24/21 0500  Weight: 104 kg 101.8 kg 99.7 kg    Examination:  Coherent white male thick neck Mallampati 4 moderate dentition neck soft supple S1-S2 no murmur Abdomen slightly tender with hernia in the midline Wound not examined on  right upper thigh Left leg slightly erythematous with scab over. Moving 4 limbs equally   Data Reviewed: personally reviewed   CBC    Component Value Date/Time   WBC 5.6 02/24/2021 0942   RBC 2.99 (L) 02/24/2021 0942   HGB 9.2 (L) 02/24/2021 0942   HGB 15.2 11/14/2012 2045   HCT 29.2 (L) 02/24/2021 0942   HCT 45.0 11/14/2012 2045   PLT 60 (L) 02/24/2021 0942   PLT 116 (L) 11/14/2012 2045   MCV 97.7 02/24/2021 0942   MCV 95 11/14/2012 2045   MCH 30.8 02/24/2021 0942   MCHC 31.5 02/24/2021 0942   RDW 20.7 (H) 02/24/2021 0942   RDW 15.2 (H) 11/14/2012 2045   LYMPHSABS 2.0 02/24/2021 0942   MONOABS 0.6 02/24/2021 0942   EOSABS 0.1 02/24/2021 0942   BASOSABS 0.0 02/24/2021 0942   CMP Latest Ref Rng & Units 02/23/2021 02/22/2021 02/21/2021  Glucose 70 - 99 mg/dL 118(H) 134(H) 93  BUN 8 - 23 mg/dL 57(H) 44(H) 27(H)  Creatinine 0.61 - 1.24 mg/dL 6.33(H) 5.51(H) 4.56(H)  Sodium 135 - 145 mmol/L 137 138 141  Potassium 3.5 - 5.1 mmol/L 4.1 3.3(L) 3.2(L)  Chloride 98 - 111 mmol/L 102 103 104  CO2 22 - 32 mmol/L 22 25 25   Calcium 8.9 - 10.3 mg/dL 8.3(L) 8.3(L) 8.7(L)  Total Protein 6.5 - 8.1 g/dL - - -  Total Bilirubin 0.3 - 1.2 mg/dL - - -  Alkaline Phos 38 - 126 U/L - - -  AST 15 - 41 U/L - - -  ALT 0 - 44 U/L - - -     Radiology Studies: DG Pelvis 1-2 Views  Result Date: 02/24/2021 CLINICAL DATA:  Right hip pain, known soft tissue fluid collection along the lateral aspect of the known  right hip prosthesis EXAM: PELVIS - 1-2 VIEW COMPARISON:  CT from earlier in the same day. FINDINGS: Bilateral hip prostheses are seen. Pelvic ring is intact. No acute fracture or dislocation is noted. Diffuse vascular calcifications are seen. IMPRESSION: No acute abnormality noted. Electronically Signed   By: Inez Catalina M.D.   On: 02/24/2021 20:23   CT PELVIS W CONTRAST  Result Date: 02/24/2021 CLINICAL DATA:  Right hip drainage.  Evaluate for infection. EXAM: CT PELVIS WITH CONTRAST TECHNIQUE: Multidetector CT imaging of the pelvis was performed using the standard protocol following the bolus administration of intravenous contrast. CONTRAST:  166mL OMNIPAQUE IOHEXOL 300 MG/ML  SOLN COMPARISON:  CT chest, abdomen, and pelvis dated February 20, 2021. FINDINGS: Urinary Tract:  Underdistended bladder. Bowel: Small bowel anastomosis again noted. Extensive sigmoid colonic diverticulosis again noted. Vascular/Lymphatic: Aortoiliac atherosclerotic vascular disease. Unchanged small reactive bilateral iliac and inguinal lymph nodes. Reproductive: The prostate is obscured by streak artifact from hip arthroplasties. Other:  Diffuse anasarca. Musculoskeletal: Prior bilateral  total hip arthroplasties. No evidence of hardware failure or loosening. Suspected right hip joint effusion with joint fluid bulging along the medial margin of the inferior acetabulum (series 8, image 89). Incompletely visualized 3.0 x 1.9 x 9.7 cm rim enhancing fluid collection with a tiny focus of air along the lateral aspect of the vastus lateralis muscle tracking superiorly adjacent to the greater trochanter. This appears to drain to the lateral skin surface via an ill-defined sinus tract (series 3, image 91). IMPRESSION: 1. Incompletely visualized 3.0 x 1.9 x 9.7 cm rim enhancing fluid collection with a tiny focus of air along the lateral aspect of the vastus lateralis muscle tracking superiorly adjacent to the greater trochanter, concerning for  abscess. This appears to drain to the lateral skin surface via an ill-defined sinus tract. 2. Suspected small right hip joint effusion with joint fluid bulging along the medial margin of the inferior acetabulum. 3. Prior bilateral total hip arthroplasties without evidence of hardware failure or loosening. 4. Diffuse anasarca. 5. Aortic Atherosclerosis (ICD10-I70.0). Electronically Signed   By: Titus Dubin M.D.   On: 02/24/2021 12:21     Scheduled Meds:  apixaban  5 mg Oral BID   atorvastatin  40 mg Oral QHS   epoetin (EPOGEN/PROCRIT) injection  6,000 Units Intravenous Q T,Th,Sa-HD   feeding supplement (NEPRO CARB STEADY)  237 mL Oral TID BM   finasteride  5 mg Oral Daily   lidocaine  1 patch Transdermal Q24H   metoprolol tartrate  12.5 mg Oral BID   midodrine  10 mg Oral TID WC   multivitamin  1 tablet Oral QHS   rOPINIRole  1 mg Oral QHS   sevelamer carbonate  1,600 mg Oral Once per day on Sun Mon Wed Fri   sevelamer carbonate  1,600 mg Oral BID WC   sodium chloride flush  10 mL Intravenous Q12H   vitamin B-12  1,000 mcg Oral Daily   Continuous Infusions:   LOS: 4 days   Time spent: 12  Nita Sells, MD [facility] To contact the attending provider between 7A-7P or the covering provider during after hours 7P-7A, please log into the web site [email or web address] and access using universal [place] password for that web site. If you do not have the password, please call the hospital operator.  02/25/2021, 9:12 AM

## 2021-02-25 NOTE — Consult Note (Signed)
Reason for Consult: Deep infection right total hip Referring Physician: Dr. Antonietta Jewel is an 75 y.o. male.  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  He has had problems with sepsis and has had stent for gallstone.  He noted a month ago that he started having some drainage from his right hip and was scheduled to go to the [place] to have this assessed.  He had CT yesterday that shows draining sinus with abscess along the vastus lateralis that appears to communicate with his hip.  He is also complaining of left thigh tenderness he has not used a prosthesis for some time on either leg.  He transfers well normally but over the last month or 2 has had much more difficulty secondary to pain in the hips.  Past Medical History:  Diagnosis Date   Demand ischemia ([place])    a. 11/2017 elev Trop in setting of AFib/SVT-->Echo [facility]): EF>55%. Mild LVH. Nl RV fxn.   Diabetes ([place])    a. Pt states that he has no hx of diabetes--A1c 5.5 11/2018.   ESRD (end stage renal disease) ([place])    a. On HD since 2019.   Hyperlipidemia    Hypotension    a. On midodrine.   Morbid obesity ([place])    PAD (peripheral artery disease) ([place])    a. s/p L AKA and R BKA.   Permanent atrial fibrillation ([place])    a. Dx 2019. CHA2DS2VASc = 3-4-->Eliquis.    Past Surgical History:  Procedure Laterality Date   AV FISTULA REPAIR     BELOW KNEE LEG AMPUTATION Bilateral    CHOLECYSTECTOMY     ERCP N/A 02/04/2021   Procedure: ENDOSCOPIC RETROGRADE CHOLANGIOPANCREATOGRAPHY (ERCP);  Surgeon: Lucilla Lame, MD;  Location: [facility] ENDOSCOPY;  Service: Endoscopy;  Laterality: N/A;   LEFT HEART CATH AND CORONARY ANGIOGRAPHY N/A 09/22/2020   Procedure: LEFT HEART CATH AND CORONARY ANGIOGRAPHY;  Surgeon: Sherren Mocha, MD;   Location: [facility];  Service: Cardiovascular;  Laterality: N/A;   TOTAL HIP ARTHROPLASTY Bilateral     History reviewed. No pertinent family history.  Social History:  reports that he has never smoked. He has never used smokeless tobacco. He reports that he does not drink alcohol and does not use drugs.  Allergies:  Allergies  Allergen Reactions   Simvastatin Other (See Comments)    Medications: I have reviewed the patient's current medications.  Results for orders placed or performed during the hospital encounter of 02/20/21 (from the past 48 hour(s))  CBC with Differential/Platelet     Status: Abnormal   Collection Time: 02/24/21  9:42 AM  Result Value Ref Range   WBC 5.6 4.0 - 10.5 K/uL   RBC 2.99 (L) 4.22 - 5.81 MIL/uL   Hemoglobin 9.2 (L) 13.0 - 17.0 g/dL   HCT 29.2 (L) 39.0 - 52.0 %   MCV 97.7 80.0 - 100.0 fL   MCH 30.8 26.0 - 34.0 pg   MCHC 31.5 30.0 - 36.0 g/dL   RDW 20.7 (H) 11.5 - 15.5 %   Platelets 60 (L) 150 - 400 K/uL    Comment: Immature Platelet Fraction may be clinically indicated, consider ordering this additional test [id number]    nRBC 0.4 (H) 0.0 - 0.2 %   Neutrophils Relative % 51 %  Neutro Abs 2.8 1.7 - 7.7 K/uL   Lymphocytes Relative 35 %   Lymphs Abs 2.0 0.7 - 4.0 K/uL   Monocytes Relative 11 %   Monocytes Absolute 0.6 0.1 - 1.0 K/uL   Eosinophils Relative 2 %   Eosinophils Absolute 0.1 0.0 - 0.5 K/uL   Basophils Relative 1 %   Basophils Absolute 0.0 0.0 - 0.1 K/uL   Immature Granulocytes 0 %   Abs Immature Granulocytes 0.02 0.00 - 0.07 K/uL    Comment: Performed at [facility], [address] [place], [place] [id number]    DG Pelvis 1-2 Views  Result Date: 02/24/2021 CLINICAL DATA:  Right hip pain, known soft tissue fluid collection along the lateral aspect of the known right hip prosthesis EXAM: PELVIS - 1-2 VIEW COMPARISON:  CT from earlier in the same day. FINDINGS: Bilateral hip prostheses are seen. Pelvic ring is  intact. No acute fracture or dislocation is noted. Diffuse vascular calcifications are seen. IMPRESSION: No acute abnormality noted. Electronically Signed   By: Inez Catalina M.D.   On: 02/24/2021 20:23   CT PELVIS W CONTRAST  Result Date: 02/24/2021 CLINICAL DATA:  Right hip drainage.  Evaluate for infection. EXAM: CT PELVIS WITH CONTRAST TECHNIQUE: Multidetector CT imaging of the pelvis was performed using the standard protocol following the bolus administration of intravenous contrast. CONTRAST:  131mL OMNIPAQUE IOHEXOL 300 MG/ML  SOLN COMPARISON:  CT chest, abdomen, and pelvis dated February 20, 2021. FINDINGS: Urinary Tract:  Underdistended bladder. Bowel: Small bowel anastomosis again noted. Extensive sigmoid colonic diverticulosis again noted. Vascular/Lymphatic: Aortoiliac atherosclerotic vascular disease. Unchanged small reactive bilateral iliac and inguinal lymph nodes. Reproductive: The prostate is obscured by streak artifact from hip arthroplasties. Other:  Diffuse anasarca. Musculoskeletal: Prior bilateral total hip arthroplasties. No evidence of hardware failure or loosening. Suspected right hip joint effusion with joint fluid bulging along the medial margin of the inferior acetabulum (series 8, image 89). Incompletely visualized 3.0 x 1.9 x 9.7 cm rim enhancing fluid collection with a tiny focus of air along the lateral aspect of the vastus lateralis muscle tracking superiorly adjacent to the greater trochanter. This appears to drain to the lateral skin surface via an ill-defined sinus tract (series 3, image 91). IMPRESSION: 1. Incompletely visualized 3.0 x 1.9 x 9.7 cm rim enhancing fluid collection with a tiny focus of air along the lateral aspect of the vastus lateralis muscle tracking superiorly adjacent to the greater trochanter, concerning for abscess. This appears to drain to the lateral skin surface via an ill-defined sinus tract. 2. Suspected small right hip joint effusion with joint  fluid bulging along the medial margin of the inferior acetabulum. 3. Prior bilateral total hip arthroplasties without evidence of hardware failure or loosening. 4. Diffuse anasarca. 5. Aortic Atherosclerosis (ICD10-I70.0). Electronically Signed   By: Titus Dubin M.D.   On: 02/24/2021 12:21    Review of Systems Blood pressure 104/72, pulse 88, temperature (!) 97.5 F (36.4 C), temperature source Oral, resp. rate 18, height 5\' 11"  (1.803 m), weight 99.7 kg, SpO2 99 %. Physical Exam He has bilateral amputations left above knee right below knee with intact skin he has a small draining sinus in the midportion of his prior right hip incision with purulent drainage present.  He has mild tenderness with hip range of motion.  On the left side he has tenderness diffusely around the lateral and anterior thigh.  There is no drainage and he is able to flex and extend his hip  with minimal pain.  CT and x-rays reviewed. assessment/Plan: 1.  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 With his amputations this is a much more complicated problem.  At this time my recommendation is for IV antibiotics for coverage of infection sed rates been ordered to evaluate for the level of infection as he has had a sed rate in the past that was normal.  With his health problems and inability ambulate may be determined that suppressive antibiotics are optimal treatment but I would recommend evaluation at the [place] when he stable and can do that as an outpatient.  Hessie Knows 02/25/2021, 7:19 AM

## 2021-02-25 NOTE — Progress Notes (Signed)
[facility]  ROUNDING NOTE   Subjective:   Antonio Phillips is a 75 year old male with past medical history including hypotension, obesity, peripheral artery disease, diabetes, hyperlipidemia, and end-stage renal disease on dialysis.  Patient presents to emergency department with abdominal pain persisting for 2 days.  Patient admitted for further evaluation for NSTEMI, initial episode of care ([place]) [I21.4] Fever, unspecified fever cause [R50.9] Altered mental status, unspecified altered mental status type [K24.09] Acute metabolic encephalopathy [B35.32] Acute congestive heart failure, unspecified heart failure type ([place]) [I50.9]  Update Patient seen and evaluated during dialysis   HEMODIALYSIS FLOWSHEET:  Blood Flow Rate (mL/min): 400 mL/min Arterial Pressure (mmHg): -170 mmHg Venous Pressure (mmHg): 150 mmHg Transmembrane Pressure (mmHg): 60 mmHg Ultrafiltration Rate (mL/min): 430 mL/min Dialysate Flow Rate (mL/min): 600 ml/min Conductivity: Machine : 14.2 Conductivity: Machine : 14.2 Dialysis Fluid Bolus: Normal Saline Bolus Amount (mL): 250 mL  No complaints at this time   Objective:  Vital signs in last 24 hours:  Temp:  [97.5 F (36.4 C)-98.4 F (36.9 C)] 97.9 F (36.6 C) (12/22 0924) Pulse Rate:  [64-110] 95 (12/22 1300) Resp:  [12-20] 15 (12/22 1300) BP: (91-120)/(47-84) 116/62 (12/22 1300) SpO2:  [90 %-100 %] 96 % (12/22 1300) Weight:  [101.6 kg] 101.6 kg (12/22 0924)  Weight change:  Filed Weights   02/23/21 2109 02/24/21 0500 02/25/21 0924  Weight: 101.8 kg 99.7 kg 101.6 kg    Intake/Output: I/O last 3 completed shifts: In: 9924 [P.O.:1060; I.V.:10] Out: 1600 [Urine:100; Other:1500]   Intake/Output this shift:  No intake/output data recorded.  Physical Exam: General: NAD, resting comfortably  Head: Normocephalic, atraumatic. Moist oral mucosal membranes  Eyes: Anicteric  Lungs:  Basilar wheeze, normal effort  Heart: Irregular   Abdomen:  Soft, tender, mild distention  Extremities: Trace peripheral edema.  Right BKA left AKA  Neurologic: Nonfocal, moving all four extremities  Skin: No lesions  Access: Left AVF    Basic Metabolic Panel: Recent Labs  Lab 02/20/21 1602 02/21/21 0347 02/22/21 0349 02/23/21 0556  NA 138 141 138 137  K 3.0* 3.2* 3.3* 4.1  CL 100 104 103 102  CO2 26 25 25 22   GLUCOSE 124* 93 134* 118*  BUN 24* 27* 44* 57*  CREATININE 3.94* 4.56* 5.51* 6.33*  CALCIUM 9.4 8.7* 8.3* 8.3*  MG  --  1.9 1.8 1.8  PHOS  --  3.8 4.5 5.6*     Liver Function Tests: Recent Labs  Lab 02/20/21 1602 02/23/21 0556  AST 18  --   ALT 9  --   ALKPHOS 150*  --   BILITOT 2.2*  --   PROT 6.8  --   ALBUMIN 3.1* 2.3*    No results for input(s): LIPASE, AMYLASE in the last 168 hours.  Recent Labs  Lab 02/21/21 0042  AMMONIA 22     CBC: Recent Labs  Lab 02/20/21 1602 02/21/21 0347 02/23/21 0556 02/24/21 0942  WBC 5.4 5.1 4.8 5.6  NEUTROABS 4.0  --   --  2.8  HGB 11.3* 10.3* 8.7* 9.2*  HCT 36.7* 34.1* 28.2* 29.2*  MCV 97.6 100.3* 98.9 97.7  PLT 68* 61* 55* 60*     Cardiac Enzymes: No results for input(s): CKTOTAL, CKMB, CKMBINDEX, TROPONINI in the last 168 hours.  BNP: Invalid input(s): POCBNP  CBG: Recent Labs  Lab 02/21/21 0115  GLUCAP 105*     Microbiology: Results for orders placed or performed during the hospital encounter of 02/20/21  Resp Panel by RT-PCR (  Flu A&B, Covid) Nasopharyngeal Swab     Status: None   Collection Time: 02/20/21  2:49 PM   Specimen: Nasopharyngeal Swab; Nasopharyngeal(NP) swabs in vial transport medium  Result Value Ref Range Status   SARS Coronavirus 2 by RT PCR NEGATIVE NEGATIVE Final    Comment: (NOTE) SARS-CoV-2 target nucleic acids are NOT DETECTED.  The SARS-CoV-2 RNA is generally detectable in upper respiratory specimens during the acute phase of infection. The lowest concentration of SARS-CoV-2 viral copies this assay can detect  is 138 copies/mL. A negative result does not preclude SARS-Cov-2 infection and should not be used as the sole basis for treatment or other patient management decisions. A negative result may occur with  improper specimen collection/handling, submission of specimen other than nasopharyngeal swab, presence of viral mutation(s) within the areas targeted by this assay, and inadequate number of viral copies(<138 copies/mL). A negative result must be combined with clinical observations, patient history, and epidemiological information. The expected result is Negative.  Fact Sheet for Patients:  EntrepreneurPulse.com.au  Fact Sheet for Healthcare Providers:  IncredibleEmployment.be  This test is no t yet approved or cleared by the [place] FDA and  has been authorized for detection and/or diagnosis of SARS-CoV-2 by FDA under an Emergency Use Authorization (EUA). This EUA will remain  in effect (meaning this test can be used) for the duration of the COVID-19 declaration under Section 564(b)(1) of the Act, 21 U.S.C.section 360bbb-3(b)(1), unless the authorization is terminated  or revoked sooner.       Influenza A by PCR NEGATIVE NEGATIVE Final   Influenza B by PCR NEGATIVE NEGATIVE Final    Comment: (NOTE) The Xpert Xpress SARS-CoV-2/FLU/RSV plus assay is intended as an aid in the diagnosis of influenza from Nasopharyngeal swab specimens and should not be used as a sole basis for treatment. Nasal washings and aspirates are unacceptable for Xpert Xpress SARS-CoV-2/FLU/RSV testing.  Fact Sheet for Patients: EntrepreneurPulse.com.au  Fact Sheet for Healthcare Providers: IncredibleEmployment.be  This test is not yet approved or cleared by the [place] FDA and has been authorized for detection and/or diagnosis of SARS-CoV-2 by FDA under an Emergency Use Authorization (EUA). This EUA will remain in effect  (meaning this test can be used) for the duration of the COVID-19 declaration under Section 564(b)(1) of the Act, 21 U.S.C. section 360bbb-3(b)(1), unless the authorization is terminated or revoked.  Performed at [facility], [address] [place], [place] [address]   Culture, blood (single)     Status: None   Collection Time: 02/20/21  9:05 PM   Specimen: BLOOD  Result Value Ref Range Status   Specimen Description BLOOD BLOOD RIGHT FOREARM  Final   Special Requests   Final    BOTTLES DRAWN AEROBIC AND ANAEROBIC Blood Culture results may not be optimal due to an excessive volume of blood received in culture bottles   Culture   Final    NO GROWTH 5 DAYS Performed at [facility], [address] [place], [place] [address]    Report Status 02/25/2021 FINAL  Final  MRSA Next Gen by PCR, Nasal     Status: None   Collection Time: 02/21/21  7:39 AM   Specimen: Nasal Mucosa; Nasal Swab  Result Value Ref Range Status   MRSA by PCR Next Gen NOT DETECTED NOT DETECTED Final    Comment: (NOTE) The GeneXpert MRSA Assay (FDA approved for NASAL specimens only), is one component of a comprehensive MRSA colonization surveillance program. It is not  intended to diagnose MRSA infection nor to guide or monitor treatment for MRSA infections. Test performance is not FDA approved in patients less than 39 years old. Performed at [facility], [address] [place], [place] [address]     Coagulation Studies: No results for input(s): LABPROT, INR in the last 72 hours.   Urinalysis: No results for input(s): COLORURINE, LABSPEC, PHURINE, GLUCOSEU, HGBUR, BILIRUBINUR, KETONESUR, PROTEINUR, UROBILINOGEN, NITRITE, LEUKOCYTESUR in the last 72 hours.  Invalid input(s): APPERANCEUR    Imaging: DG Pelvis 1-2 Views  Result Date: 02/24/2021 CLINICAL DATA:  Right hip pain, known soft tissue fluid collection along the lateral aspect of the known right hip prosthesis EXAM:  PELVIS - 1-2 VIEW COMPARISON:  CT from earlier in the same day. FINDINGS: Bilateral hip prostheses are seen. Pelvic ring is intact. No acute fracture or dislocation is noted. Diffuse vascular calcifications are seen. IMPRESSION: No acute abnormality noted. Electronically Signed   By: Inez Catalina M.D.   On: 02/24/2021 20:23   CT PELVIS W CONTRAST  Result Date: 02/24/2021 CLINICAL DATA:  Right hip drainage.  Evaluate for infection. EXAM: CT PELVIS WITH CONTRAST TECHNIQUE: Multidetector CT imaging of the pelvis was performed using the standard protocol following the bolus administration of intravenous contrast. CONTRAST:  123mL OMNIPAQUE IOHEXOL 300 MG/ML  SOLN COMPARISON:  CT chest, abdomen, and pelvis dated February 20, 2021. FINDINGS: Urinary Tract:  Underdistended bladder. Bowel: Small bowel anastomosis again noted. Extensive sigmoid colonic diverticulosis again noted. Vascular/Lymphatic: Aortoiliac atherosclerotic vascular disease. Unchanged small reactive bilateral iliac and inguinal lymph nodes. Reproductive: The prostate is obscured by streak artifact from hip arthroplasties. Other:  Diffuse anasarca. Musculoskeletal: Prior bilateral total hip arthroplasties. No evidence of hardware failure or loosening. Suspected right hip joint effusion with joint fluid bulging along the medial margin of the inferior acetabulum (series 8, image 89). Incompletely visualized 3.0 x 1.9 x 9.7 cm rim enhancing fluid collection with a tiny focus of air along the lateral aspect of the vastus lateralis muscle tracking superiorly adjacent to the greater trochanter. This appears to drain to the lateral skin surface via an ill-defined sinus tract (series 3, image 91). IMPRESSION: 1. Incompletely visualized 3.0 x 1.9 x 9.7 cm rim enhancing fluid collection with a tiny focus of air along the lateral aspect of the vastus lateralis muscle tracking superiorly adjacent to the greater trochanter, concerning for abscess. This appears to  drain to the lateral skin surface via an ill-defined sinus tract. 2. Suspected small right hip joint effusion with joint fluid bulging along the medial margin of the inferior acetabulum. 3. Prior bilateral total hip arthroplasties without evidence of hardware failure or loosening. 4. Diffuse anasarca. 5. Aortic Atherosclerosis (ICD10-I70.0). Electronically Signed   By: Titus Dubin M.D.   On: 02/24/2021 12:21     Medications:      apixaban  5 mg Oral BID   atorvastatin  40 mg Oral QHS   epoetin (EPOGEN/PROCRIT) injection  6,000 Units Intravenous Q T,Th,Sa-HD   feeding supplement (NEPRO CARB STEADY)  237 mL Oral TID BM   finasteride  5 mg Oral Daily   lidocaine  1 patch Transdermal Q24H   metoprolol tartrate  12.5 mg Oral BID   midodrine  10 mg Oral TID WC   multivitamin  1 tablet Oral QHS   rOPINIRole  1 mg Oral QHS   sevelamer carbonate  1,600 mg Oral Once per day on Sun Mon Wed Fri   sevelamer carbonate  1,600 mg Oral BID  WC   sodium chloride flush  10 mL Intravenous Q12H   vitamin B-12  1,000 mcg Oral Daily      Assessment/ Plan:  [name] is a 75 y.o.  male with past medical history including hypotension, obesity, peripheral artery disease, diabetes, hyperlipidemia, and end-stage renal disease on dialysis.  Patient presents to emergency department with abdominal pain persisting for 2 days.  Patient admitted for further evaluation for NSTEMI, initial episode of care ([place]) [I21.4] Fever, unspecified fever cause [R50.9] Altered mental status, unspecified altered mental status type [Q22.97] Acute metabolic encephalopathy [L89.21] Acute congestive heart failure, unspecified heart failure type ([place]) [I50.9]  [facility]/TTS/Lt AVF/96kg   End-stage renal disease on HD TTS.  Currently receiving dialysis, UF goal 1.5L.   Next treatment scheduled for Thursday.  2. Anemia of chronic kidney disease Lab Results  Component Value Date   HGB 9.2 (L)  02/24/2021    Continue patient on Epogen 6000 IV with dialysis.  3. Secondary Hyperparathyroidism: Lab Results  Component Value Date   CALCIUM 8.3 (L) 02/23/2021   PHOS 5.6 (H) 02/23/2021  We will continue to monitor.   4.  Fever. Remains afebrile. Receiving cefepime.    LOS: 4 Skyllar Notarianni 12/22/20221:22 PM

## 2021-02-25 NOTE — Progress Notes (Signed)
Progress Note  Patient Name: Antonio Phillips Date of Encounter: 02/25/2021  [facility] Cardiologist: Ida Rogue, MD   Subjective   Seen during dialysis No complaints, no chest pain, no abdominal pain, eating well Blood pressure low but stable, receiving midodrine  Inpatient Medications    Scheduled Meds:  apixaban  5 mg Oral BID   atorvastatin  40 mg Oral QHS   epoetin (EPOGEN/PROCRIT) injection  6,000 Units Intravenous Q T,Th,Sa-HD   feeding supplement (NEPRO CARB STEADY)  237 mL Oral TID BM   finasteride  5 mg Oral Daily   lidocaine  1 patch Transdermal Q24H   metoprolol tartrate  12.5 mg Oral BID   midodrine  10 mg Oral TID WC   multivitamin  1 tablet Oral QHS   rOPINIRole  1 mg Oral QHS   sevelamer carbonate  1,600 mg Oral Once per day on Sun Mon Wed Fri   sevelamer carbonate  1,600 mg Oral BID WC   sodium chloride flush  10 mL Intravenous Q12H   vitamin B-12  1,000 mcg Oral Daily   Continuous Infusions:  PRN Meds: docusate sodium, HYDROcodone-acetaminophen, HYDROmorphone (DILAUDID) injection, polyethylene glycol   Vital Signs    Vitals:   02/25/21 1315 02/25/21 1330 02/25/21 1345 02/25/21 1400  BP: 134/60 (!) 115/57 (!) 115/55 (!) 110/59  Pulse: 96 (!) 59 78 (!) 44  Resp: (!) 22 15 16 15   Temp:      TempSrc:      SpO2: 100% 95% 97% 95%  Weight:      Height:        Intake/Output Summary (Last 24 hours) at 02/25/2021 1536 Last data filed at 02/25/2021 1311 Gross per 24 hour  Intake 10 ml  Output 1000 ml  Net -990 ml   Last 3 Weights 02/25/2021 02/24/2021 02/23/2021  Weight (lbs) 223 lb 15.8 oz 219 lb 12.8 oz 224 lb 6.9 oz  Weight (kg) 101.6 kg 99.7 kg 101.8 kg      Telemetry    Atrial fibrillation rate 90- Personally Reviewed  ECG     - Personally Reviewed  Physical Exam   GEN: No acute distress.   Neck: No JVD Cardiac: Irregularly irregular no murmurs, rubs, or gallops.  Respiratory: Clear to auscultation bilaterally. GI:  Soft, nontender, non-distended  MS: No edema; bilateral amputations Neuro:  Nonfocal  Psych: Normal affect   Labs    High Sensitivity Troponin:   Recent Labs  Lab 02/02/21 2135 02/05/21 0416 02/05/21 0845 02/20/21 2123 02/20/21 2315  TROPONINIHS 397* 147* 146* 1,557* 2,463*     Chemistry Recent Labs  Lab 02/20/21 1602 02/21/21 0347 02/22/21 0349 02/23/21 0556  NA 138 141 138 137  K 3.0* 3.2* 3.3* 4.1  CL 100 104 103 102  CO2 26 25 25 22   GLUCOSE 124* 93 134* 118*  BUN 24* 27* 44* 57*  CREATININE 3.94* 4.56* 5.51* 6.33*  CALCIUM 9.4 8.7* 8.3* 8.3*  MG  --  1.9 1.8 1.8  PROT 6.8  --   --   --   ALBUMIN 3.1*  --   --  2.3*  AST 18  --   --   --   ALT 9  --   --   --   ALKPHOS 150*  --   --   --   BILITOT 2.2*  --   --   --   GFRNONAA 15* 13* 10* 9*  ANIONGAP 12 12 10 13     Lipids  No results for input(s): CHOL, TRIG, HDL, LABVLDL, LDLCALC, CHOLHDL in the last 168 hours.  Hematology Recent Labs  Lab 02/21/21 0347 02/23/21 0556 02/24/21 0942  WBC 5.1 4.8 5.6  RBC 3.40* 2.85* 2.99*  HGB 10.3* 8.7* 9.2*  HCT 34.1* 28.2* 29.2*  MCV 100.3* 98.9 97.7  MCH 30.3 30.5 30.8  MCHC 30.2 30.9 31.5  RDW 20.6* 20.3* 20.7*  PLT 61* 55* 60*   Thyroid No results for input(s): TSH, FREET4 in the last 168 hours.  BNP Recent Labs  Lab 02/20/21 2123  BNP >4,500.0*    DDimer No results for input(s): DDIMER in the last 168 hours.   Radiology    DG Pelvis 1-2 Views  Result Date: 02/24/2021 CLINICAL DATA:  Right hip pain, known soft tissue fluid collection along the lateral aspect of the known right hip prosthesis EXAM: PELVIS - 1-2 VIEW COMPARISON:  CT from earlier in the same day. FINDINGS: Bilateral hip prostheses are seen. Pelvic ring is intact. No acute fracture or dislocation is noted. Diffuse vascular calcifications are seen. IMPRESSION: No acute abnormality noted. Electronically Signed   By: Inez Catalina M.D.   On: 02/24/2021 20:23   CT PELVIS W CONTRAST  Result  Date: 02/24/2021 CLINICAL DATA:  Right hip drainage.  Evaluate for infection. EXAM: CT PELVIS WITH CONTRAST TECHNIQUE: Multidetector CT imaging of the pelvis was performed using the standard protocol following the bolus administration of intravenous contrast. CONTRAST:  13mL OMNIPAQUE IOHEXOL 300 MG/ML  SOLN COMPARISON:  CT chest, abdomen, and pelvis dated February 20, 2021. FINDINGS: Urinary Tract:  Underdistended bladder. Bowel: Small bowel anastomosis again noted. Extensive sigmoid colonic diverticulosis again noted. Vascular/Lymphatic: Aortoiliac atherosclerotic vascular disease. Unchanged small reactive bilateral iliac and inguinal lymph nodes. Reproductive: The prostate is obscured by streak artifact from hip arthroplasties. Other:  Diffuse anasarca. Musculoskeletal: Prior bilateral total hip arthroplasties. No evidence of hardware failure or loosening. Suspected right hip joint effusion with joint fluid bulging along the medial margin of the inferior acetabulum (series 8, image 89). Incompletely visualized 3.0 x 1.9 x 9.7 cm rim enhancing fluid collection with a tiny focus of air along the lateral aspect of the vastus lateralis muscle tracking superiorly adjacent to the greater trochanter. This appears to drain to the lateral skin surface via an ill-defined sinus tract (series 3, image 91). IMPRESSION: 1. Incompletely visualized 3.0 x 1.9 x 9.7 cm rim enhancing fluid collection with a tiny focus of air along the lateral aspect of the vastus lateralis muscle tracking superiorly adjacent to the greater trochanter, concerning for abscess. This appears to drain to the lateral skin surface via an ill-defined sinus tract. 2. Suspected small right hip joint effusion with joint fluid bulging along the medial margin of the inferior acetabulum. 3. Prior bilateral total hip arthroplasties without evidence of hardware failure or loosening. 4. Diffuse anasarca. 5. Aortic Atherosclerosis (ICD10-I70.0). Electronically  Signed   By: Titus Dubin M.D.   On: 02/24/2021 12:21    Cardiac Studies     Patient Profile     75 y.o. 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 fib on Eliquis, ESRD on HD, hypotension, DM2, anemia of chronic disease, morbid obesity presented with low-grade fever and chills with mental status changes and reported abdominal pain.  He was found to have elevated troponin.  Assessment & Plan    Coronary artery disease/demand ischemia Troponin 2400 in the setting of gallstone  pancreatitis, chills fever abdominal pain mental status changes -Ejection fraction 40 to 45% -Has been seen by interventional cardiology,  -----no plan for ischemic work-up at this time -----Plavix held for low platelets -We will continue Eliquis, low-dose beta-blocker, statin   Permanent atrial fibrillation Rate controlled on metoprolol, on Eliquis   Cardiomyopathy/chronic diastolic and systolic CHF On hemodialysis for fluid management   Gallstone pancreatitis ERCP with stenting On antibiotics, afebrile   Chronic infection right total hip Possible infection left total hip Plan for long-term IV antibiotics  [facility] will sign off.   Medication Recommendations: Medications as above Other recommendations (labs, testing, etc): No further cardiac testing Follow up as an outpatient: We will arrange outpatient follow-up   Total encounter time more than 25 minutes  Greater than 50% was spent in counseling and coordination of care with the patient    For questions or updates, please contact [place] Please consult [email or web address] for contact info under        Signed, Ida Rogue, MD  02/25/2021, 3:36 PM

## 2021-02-26 ENCOUNTER — Inpatient Hospital Stay: Payer: Medicare Other

## 2021-02-26 ENCOUNTER — Encounter: Payer: Self-pay | Admitting: Pulmonary Disease

## 2021-02-26 DIAGNOSIS — K921 Melena: Secondary | ICD-10-CM | POA: Diagnosis not present

## 2021-02-26 DIAGNOSIS — I214 Non-ST elevation (NSTEMI) myocardial infarction: Secondary | ICD-10-CM | POA: Diagnosis not present

## 2021-02-26 DIAGNOSIS — L0291 Cutaneous abscess, unspecified: Secondary | ICD-10-CM | POA: Diagnosis not present

## 2021-02-26 DIAGNOSIS — I739 Peripheral vascular disease, unspecified: Secondary | ICD-10-CM

## 2021-02-26 LAB — RENAL FUNCTION PANEL
Albumin: 2.3 g/dL — ABNORMAL LOW (ref 3.5–5.0)
Anion gap: 8 (ref 5–15)
BUN: 35 mg/dL — ABNORMAL HIGH (ref 8–23)
CO2: 28 mmol/L (ref 22–32)
Calcium: 8.4 mg/dL — ABNORMAL LOW (ref 8.9–10.3)
Chloride: 99 mmol/L (ref 98–111)
Creatinine, Ser: 3.47 mg/dL — ABNORMAL HIGH (ref 0.61–1.24)
GFR, Estimated: 18 mL/min — ABNORMAL LOW (ref >60)
Glucose, Bld: 91 mg/dL (ref 70–99)
Phosphorus: 3.4 mg/dL (ref 2.5–4.6)
Potassium: 4.7 mmol/L (ref 3.5–5.1)
Sodium: 135 mmol/L (ref 135–145)

## 2021-02-26 LAB — CBC WITH DIFFERENTIAL/PLATELET
Abs Immature Granulocytes: 0.05 10*3/uL (ref 0.00–0.07)
Abs Immature Granulocytes: 0.04 10*3/uL (ref 0.00–0.07)
Basophils Absolute: 0 10*3/uL (ref 0.0–0.1)
Basophils Absolute: 0 10*3/uL (ref 0.0–0.1)
Basophils Relative: 0 %
Basophils Relative: 1 %
Eosinophils Absolute: 0.2 10*3/uL (ref 0.0–0.5)
Eosinophils Absolute: 0.2 10*3/uL (ref 0.0–0.5)
Eosinophils Relative: 2 %
Eosinophils Relative: 3 %
HCT: 27.3 % — ABNORMAL LOW (ref 39.0–52.0)
HCT: 26.1 % — ABNORMAL LOW (ref 39.0–52.0)
Hemoglobin: 8.5 g/dL — ABNORMAL LOW (ref 13.0–17.0)
Hemoglobin: 8.1 g/dL — ABNORMAL LOW (ref 13.0–17.0)
Immature Granulocytes: 1 %
Immature Granulocytes: 1 %
Lymphocytes Relative: 32 %
Lymphocytes Relative: 30 %
Lymphs Abs: 2.1 10*3/uL (ref 0.7–4.0)
Lymphs Abs: 1.8 10*3/uL (ref 0.7–4.0)
MCH: 31.3 pg (ref 26.0–34.0)
MCH: 31 pg (ref 26.0–34.0)
MCHC: 31.1 g/dL (ref 30.0–36.0)
MCHC: 31 g/dL (ref 30.0–36.0)
MCV: 100.4 fL — ABNORMAL HIGH (ref 80.0–100.0)
MCV: 100 fL (ref 80.0–100.0)
Monocytes Absolute: 0.5 10*3/uL (ref 0.1–1.0)
Monocytes Absolute: 0.5 10*3/uL (ref 0.1–1.0)
Monocytes Relative: 8 %
Monocytes Relative: 9 %
Neutro Abs: 3.7 10*3/uL (ref 1.7–7.7)
Neutro Abs: 3.5 10*3/uL (ref 1.7–7.7)
Neutrophils Relative %: 57 %
Neutrophils Relative %: 56 %
Platelets: 87 10*3/uL — ABNORMAL LOW (ref 150–400)
Platelets: 91 10*3/uL — ABNORMAL LOW (ref 150–400)
RBC: 2.72 MIL/uL — ABNORMAL LOW (ref 4.22–5.81)
RBC: 2.61 MIL/uL — ABNORMAL LOW (ref 4.22–5.81)
RDW: 21.2 % — ABNORMAL HIGH (ref 11.5–15.5)
RDW: 21.2 % — ABNORMAL HIGH (ref 11.5–15.5)
Smear Review: NORMAL
WBC: 6.6 10*3/uL (ref 4.0–10.5)
WBC: 6 10*3/uL (ref 4.0–10.5)
nRBC: 0 % (ref 0.0–0.2)
nRBC: 0 % (ref 0.0–0.2)

## 2021-02-26 LAB — OCCULT BLOOD X 1 CARD TO LAB, STOOL: Fecal Occult Bld: POSITIVE — AB

## 2021-02-26 MED ORDER — SODIUM CHLORIDE 0.9 % IV SOLN
1.0000 g | INTRAVENOUS | Status: DC
Start: 2021-02-26 — End: 2021-03-02
  Administered 2021-02-26 – 2021-03-01 (×4): 1 g via INTRAVENOUS
  Filled 2021-02-26 (×6): qty 1

## 2021-02-26 MED ORDER — PANTOPRAZOLE 80MG IVPB - SIMPLE MED
80.0000 mg | Freq: Once | INTRAVENOUS | Status: AC
  Administered 2021-02-26: 17:00:00 80 mg via INTRAVENOUS
  Filled 2021-02-26: qty 100

## 2021-02-26 MED ORDER — PANTOPRAZOLE SODIUM 40 MG IV SOLR
40.0000 mg | Freq: Two times a day (BID) | INTRAVENOUS | Status: DC
  Administered 2021-02-26 – 2021-03-03 (×9): 40 mg via INTRAVENOUS
  Filled 2021-02-26 (×9): qty 40

## 2021-02-26 MED ORDER — PANTOPRAZOLE SODIUM 40 MG IV SOLR: 40.0000 mg | Freq: Two times a day (BID) | INTRAVENOUS | Status: DC

## 2021-02-26 MED ORDER — SODIUM CHLORIDE 0.9 % IV SOLN: INTRAVENOUS | Status: DC

## 2021-02-26 MED ORDER — IOHEXOL 300 MG/ML  SOLN
100.0000 mL | Freq: Once | INTRAMUSCULAR | Status: AC | PRN
  Administered 2021-02-26: 09:00:00 100 mL via INTRAVENOUS

## 2021-02-26 MED ORDER — VANCOMYCIN HCL IN DEXTROSE 1-5 GM/200ML-% IV SOLN
1000.0000 mg | INTRAVENOUS | Status: DC
  Administered 2021-02-27: 1000 mg via INTRAVENOUS
  Filled 2021-02-26 (×2): qty 200

## 2021-02-26 MED ORDER — VANCOMYCIN HCL 2000 MG/400ML IV SOLN
2000.0000 mg | Freq: Once | INTRAVENOUS | Status: AC
  Administered 2021-02-26: 22:00:00 2000 mg via INTRAVENOUS
  Filled 2021-02-26: qty 400

## 2021-02-26 NOTE — Progress Notes (Signed)
Date of Admission:  02/20/2021     ID: Antonio Phillips is a 75 y.o. male  Principal Problem:   Acute metabolic encephalopathy Active Problems:   NSTEMI, initial episode of care ([facility])   Longstanding persistent atrial fibrillation (HCC)    Subjective: Pt had aspiration of the rt thigh fluid collection Serosanguinous as per IR Pt has pain and swelling left thigh  Medications:   atorvastatin  40 mg Oral QHS   epoetin (EPOGEN/PROCRIT) injection  6,000 Units Intravenous Q T,Th,Sa-HD   feeding supplement (NEPRO CARB STEADY)  237 mL Oral TID BM   finasteride  5 mg Oral Daily   lidocaine  1 patch Transdermal Q24H   metoprolol tartrate  12.5 mg Oral BID   midodrine  10 mg Oral TID WC   multivitamin  1 tablet Oral QHS   pantoprazole (PROTONIX) IV  40 mg Intravenous Q12H   rOPINIRole  1 mg Oral QHS   sevelamer carbonate  1,600 mg Oral Once per day on Sun Mon Wed Fri   sevelamer carbonate  1,600 mg Oral BID WC   sodium chloride flush  10 mL Intravenous Q12H   vitamin B-12  1,000 mcg Oral Daily    Objective: Vital signs in last 24 hours: Temp:  [97.5 F (36.4 C)-98.6 F (37 C)] 97.7 F (36.5 C) (12/23 1505) Pulse Rate:  [77-112] 78 (12/23 1505) Resp:  [16-20] 19 (12/23 1116) BP: (86-133)/(49-80) 118/77 (12/23 1505) SpO2:  [96 %-100 %] 99 % (12/23 1221) Weight:  [102.1 kg] 102.1 kg (12/23 0302)  PHYSICAL EXAM:  General: Alert, cooperative, no distress, appears stated age. pale Head: Normocephalic, without obvious abnormality, atraumatic. Eyes: Conjunctivae clear, anicteric sclerae. Pupils are equal ENT Nares normal. No drainage or sinus tenderness. Lips, mucosa, and tongue normal. No Thrush Neck: Supple, symmetrical, no adenopathy, thyroid: non tender no carotid bruit and no JVD. Back: No CVA tenderness. Lungs: Clear to auscultation bilaterally. No Wheezing or Rhonchi. No rales. Heart: Regular rate and rhythm, no murmur, rub or gallop. Abdomen: Soft, non-tender,not  distended. Bowel sounds normal. No masses Extremities: left AKA-      stump wound covered with dressing/ thigh swollen, indurated, tender and erythematous Rt BKA Sinus over the lateral thigh surgical scar Skin: No rashes or lesions. Or bruising Lymph: Cervical, supraclavicular normal. Neurologic: Grossly non-focal  Lab Results Recent Labs    02/26/21 0520 02/26/21 1528  WBC 6.6 6.0  HGB 8.5* 8.1*  HCT 27.3* 26.1*  NA 135  --   K 4.7  --   CL 99  --   CO2 28  --   BUN 35*  --   CREATININE 3.47*  --    Liver Panel Recent Labs    02/26/21 0520  ALBUMIN 2.3*   Sedimentation Rate Recent Labs    02/25/21 0706  ESRSEDRATE 25*   C-Reactive Protein No results for input(s): CRP in the last 72 hours.  Microbiology:  Studies/Results: DG Pelvis 1-2 Views  Result Date: 02/24/2021 CLINICAL DATA:  Right hip pain, known soft tissue fluid collection along the lateral aspect of the known right hip prosthesis EXAM: PELVIS - 1-2 VIEW COMPARISON:  CT from earlier in the same day. FINDINGS: Bilateral hip prostheses are seen. Pelvic ring is intact. No acute fracture or dislocation is noted. Diffuse vascular calcifications are seen. IMPRESSION: No acute abnormality noted. Electronically Signed   By: Inez Catalina M.D.   On: 02/24/2021 20:23   CT FEMUR LEFT W CONTRAST  Result Date:  02/26/2021 CLINICAL DATA:  Concern for soft tissue infection. Thigh swelling. Left leg pain. EXAM: CT OF THE LOWER RIGHT EXTREMITY WITH CONTRAST TECHNIQUE: Multidetector CT imaging of the lower right extremity was performed according to the standard protocol following intravenous contrast administration. CONTRAST:  150mL OMNIPAQUE IOHEXOL 300 MG/ML  SOLN COMPARISON:  CT pelvis 02/24/2021 FINDINGS: Bones/Joint/Cartilage Left total hip arthroplasty without hardware failure or complication. Beam hardening artifact resulting from the orthopedic hardware partially obscures adjacent soft tissue and osseous structures.  No fracture or dislocation. Normal alignment. No joint effusion. 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 Ligaments Ligaments are suboptimally evaluated by CT. Muscles and Tendons Generalized muscle atrophy of the extensor compartment muscles. Severe atrophy of the flexor compartment muscles. No intramuscular fluid collection or hematoma. Soft tissue No fluid collection or hematoma. No soft tissue mass. Soft tissue edema in the subcutaneous fat of the left thigh most severe along the lateral aspect with skin thickening and extending into the pelvis most consistent with cellulitis. No drainable fluid collection to suggest an abscess. IMPRESSION: 1. Soft tissue edema in the subcutaneous fat of the left thigh most severe along the lateral aspect with skin thickening and extending into the pelvis most consistent with cellulitis. No drainable fluid collection to suggest an abscess. Electronically Signed   By: Kathreen Devoid M.D.   On: 02/26/2021 09:37   [place] IMAGE GUIDED DRAINAGE BY PERCUTANEOUS CATHETER  Result Date: 02/26/2021 INDICATION: Patient with history of right hip arthroplasty and recent complaints of right lateral thigh open wound drainage, recent imaging revealed fluid collection along the lateral aspect of the vastus lateralis muscle concerning for abscess. Request received for diagnostic aspiration. EXAM: RIGHT THIGH FLUID COLLECTION ASPIRATION UNDER ULTRASOUND MEDICATIONS: Local 1% Lidocaine only. ANESTHESIA/SEDATION: Local 1% Lidocaine only. COMPLICATIONS: None immediate. PROCEDURE: Risks and benefits of the procedure were discussed today with the patient/patient's legal guardian, all questions were answered and informed written consent was obtained from the patient/patient's legal guardian prior to the procedure. Potential complications that were discussed included, but are not limited to: introduction of infection, bleeding, and inability to  aspirate fluid. The patient/patient's legal guardian voiced understanding of these risks and agreed to proceed. The usual time-out protocol was performed immediately prior to the procedure. The appropriate skin site for needle placement was chosen with the aid of ultrasound. Using the usual sterile technique with chlorhexidine and lidocaine 1% as local anesthetic, a Yueh catheter was advanced into the right lateral thigh fluid collection under ultrasound guidance. Approximately 9 cc of serosanguineous colored fluid was aspirated and sent to the laboratory for the requested studies. The needle was removed in its entirety and a sterile bandage was applied. The patient tolerated the procedure well without apparent immediate complications and was discharged from the radiology department in good condition. IMPRESSION: Technically successful ultrasound guided right thigh fluid collection aspiration. This exam was performed by Tsosie Billing PA-C, and was supervised and interpreted by Dr. Pascal Lux. Electronically Signed   By: Sandi Mariscal M.D.   On: 02/26/2021 16:20     Assessment/Plan: ?75 yr male presented with lethargy from HD    Was thought to have sepsis- source was not clear- CT abdomen showed cystitis but no urine was sent and he is a dialysis patient who doe snot make much urine   Now he has a rt thigh surgical site sinus and need to r/o rt PJI- The CT showed a collection- IR aspiration done today- culture sent  He also has left thigh swelling and erythema - imaging done today no abscess- looks lie cellulitis/soft tissue infection  with wound on the stump. Culture from wound sent Will restart vanco and cefepime till cultures are available I  Recent E.coli bacteremia due to choledocholithiasis, acute pancreatitis s/p ERCP and CBD stent placement on 02/04/21. 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

## 2021-02-26 NOTE — Progress Notes (Signed)
[facility]  ROUNDING NOTE   Subjective:   Antonio Phillips is a 75 year old male with past medical history including hypotension, obesity, peripheral artery disease, diabetes, hyperlipidemia, and end-stage renal disease on dialysis.  Patient presents to emergency department with abdominal pain persisting for 2 days.  Patient admitted for further evaluation for NSTEMI, initial episode of care ([place]) [I21.4] Fever, unspecified fever cause [R50.9] Altered mental status, unspecified altered mental status type [G83.66] Acute metabolic encephalopathy [Q94.76] Acute congestive heart failure, unspecified heart failure type ([place]) [I50.9]  Update Patient sitting up in bed Tolerating meals Denies shortness of breath  Dialysis yesterday, tolerated well  Objective:  Vital signs in last 24 hours:  Temp:  [97.5 F (36.4 C)-98.6 F (37 C)] 97.5 F (36.4 C) (12/23 1116) Pulse Rate:  [44-112] 112 (12/23 1221) Resp:  [15-20] 19 (12/23 1116) BP: (86-133)/(49-80) 86/53 (12/23 1221) SpO2:  [95 %-100 %] 99 % (12/23 1221) Weight:  [102.1 kg] 102.1 kg (12/23 0302)  Weight change:  Filed Weights   02/24/21 0500 02/25/21 0924 02/26/21 0302  Weight: 99.7 kg 101.6 kg 102.1 kg    Intake/Output: I/O last 3 completed shifts: In: 190 [P.O.:180; I.V.:10] Out: 1075 [Urine:75; Other:1000]   Intake/Output this shift:  No intake/output data recorded.  Physical Exam: General: NAD, resting comfortably  Head: Normocephalic, atraumatic. Moist oral mucosal membranes  Eyes: Anicteric  Lungs:  Basilar wheeze, normal effort  Heart: Irregular  Abdomen:  Soft, tender, mild distention  Extremities: Trace peripheral edema.  Right BKA left AKA  Neurologic: Nonfocal, moving all four extremities  Skin: No lesions  Access: Left AVF    Basic Metabolic Panel: Recent Labs  Lab 02/20/21 1602 02/21/21 0347 02/22/21 0349 02/23/21 0556 02/26/21 0520  NA 138 141 138 137 135  K 3.0* 3.2* 3.3* 4.1  4.7  CL 100 104 103 102 99  CO2 26 25 25 22 28   GLUCOSE 124* 93 134* 118* 91  BUN 24* 27* 44* 57* 35*  CREATININE 3.94* 4.56* 5.51* 6.33* 3.47*  CALCIUM 9.4 8.7* 8.3* 8.3* 8.4*  MG  --  1.9 1.8 1.8  --   PHOS  --  3.8 4.5 5.6* 3.4     Liver Function Tests: Recent Labs  Lab 02/20/21 1602 02/23/21 0556 02/26/21 0520  AST 18  --   --   ALT 9  --   --   ALKPHOS 150*  --   --   BILITOT 2.2*  --   --   PROT 6.8  --   --   ALBUMIN 3.1* 2.3* 2.3*    No results for input(s): LIPASE, AMYLASE in the last 168 hours.  Recent Labs  Lab 02/21/21 0042  AMMONIA 22     CBC: Recent Labs  Lab 02/20/21 1602 02/21/21 0347 02/23/21 0556 02/24/21 0942 02/26/21 0520  WBC 5.4 5.1 4.8 5.6 6.6  NEUTROABS 4.0  --   --  2.8 3.7  HGB 11.3* 10.3* 8.7* 9.2* 8.5*  HCT 36.7* 34.1* 28.2* 29.2* 27.3*  MCV 97.6 100.3* 98.9 97.7 100.4*  PLT 68* 61* 55* 60* 87*     Cardiac Enzymes: No results for input(s): CKTOTAL, CKMB, CKMBINDEX, TROPONINI in the last 168 hours.  BNP: Invalid input(s): POCBNP  CBG: Recent Labs  Lab 02/21/21 0115  GLUCAP 105*     Microbiology: Results for orders placed or performed during the hospital encounter of 02/20/21  Resp Panel by RT-PCR (Flu A&B, Covid) Nasopharyngeal Swab     Status: None  Collection Time: 02/20/21  2:49 PM   Specimen: Nasopharyngeal Swab; Nasopharyngeal(NP) swabs in vial transport medium  Result Value Ref Range Status   SARS Coronavirus 2 by RT PCR NEGATIVE NEGATIVE Final    Comment: (NOTE) SARS-CoV-2 target nucleic acids are NOT DETECTED.  The SARS-CoV-2 RNA is generally detectable in upper respiratory specimens during the acute phase of infection. The lowest concentration of SARS-CoV-2 viral copies this assay can detect is 138 copies/mL. A negative result does not preclude SARS-Cov-2 infection and should not be used as the sole basis for treatment or other patient management decisions. A negative result may occur with  improper  specimen collection/handling, submission of specimen other than nasopharyngeal swab, presence of viral mutation(s) within the areas targeted by this assay, and inadequate number of viral copies(<138 copies/mL). A negative result must be combined with clinical observations, patient history, and epidemiological information. The expected result is Negative.  Fact Sheet for Patients:  EntrepreneurPulse.com.au  Fact Sheet for Healthcare Providers:  IncredibleEmployment.be  This test is no t yet approved or cleared by the [place] FDA and  has been authorized for detection and/or diagnosis of SARS-CoV-2 by FDA under an Emergency Use Authorization (EUA). This EUA will remain  in effect (meaning this test can be used) for the duration of the COVID-19 declaration under Section 564(b)(1) of the Act, 21 U.S.C.section 360bbb-3(b)(1), unless the authorization is terminated  or revoked sooner.       Influenza A by PCR NEGATIVE NEGATIVE Final   Influenza B by PCR NEGATIVE NEGATIVE Final    Comment: (NOTE) The Xpert Xpress SARS-CoV-2/FLU/RSV plus assay is intended as an aid in the diagnosis of influenza from Nasopharyngeal swab specimens and should not be used as a sole basis for treatment. Nasal washings and aspirates are unacceptable for Xpert Xpress SARS-CoV-2/FLU/RSV testing.  Fact Sheet for Patients: EntrepreneurPulse.com.au  Fact Sheet for Healthcare Providers: IncredibleEmployment.be  This test is not yet approved or cleared by the [place] FDA and has been authorized for detection and/or diagnosis of SARS-CoV-2 by FDA under an Emergency Use Authorization (EUA). This EUA will remain in effect (meaning this test can be used) for the duration of the COVID-19 declaration under Section 564(b)(1) of the Act, 21 U.S.C. section 360bbb-3(b)(1), unless the authorization is terminated or revoked.  Performed at  [facility], [address] [place], [place] [address]   Culture, blood (single)     Status: None   Collection Time: 02/20/21  9:05 PM   Specimen: BLOOD  Result Value Ref Range Status   Specimen Description BLOOD BLOOD RIGHT FOREARM  Final   Special Requests   Final    BOTTLES DRAWN AEROBIC AND ANAEROBIC Blood Culture results may not be optimal due to an excessive volume of blood received in culture bottles   Culture   Final    NO GROWTH 5 DAYS Performed at [facility], [address] [place], [place] [address]    Report Status 02/25/2021 FINAL  Final  MRSA Next Gen by PCR, Nasal     Status: None   Collection Time: 02/21/21  7:39 AM   Specimen: Nasal Mucosa; Nasal Swab  Result Value Ref Range Status   MRSA by PCR Next Gen NOT DETECTED NOT DETECTED Final    Comment: (NOTE) The GeneXpert MRSA Assay (FDA approved for NASAL specimens only), is one component of a comprehensive MRSA colonization surveillance program. It is not intended to diagnose MRSA infection nor to guide or monitor treatment for MRSA  infections. Test performance is not FDA approved in patients less than 49 years old. Performed at [facility], [address] [place], [place] [address]   Aerobic Culture w Gram Stain (superficial specimen)     Status: None (Preliminary result)   Collection Time: 02/25/21  5:25 PM   Specimen: Joint, Hip; Wound  Result Value Ref Range Status   Specimen Description   Final    HIP RIGHT HIP WOUND Performed at [facility], [address], [place], [place] [address]    Special Requests   Final    NONE Performed at [facility], [address] [place], [place] [address]    Gram Stain   Final    FEW WBC PRESENT, PREDOMINANTLY MONONUCLEAR NO ORGANISMS SEEN    Culture   Final    NO GROWTH < 24 HOURS Performed at [facility] [address] [place], [place] [address]    Report Status PENDING  Incomplete     Coagulation Studies: No results for input(s): LABPROT, INR in the last 72 hours.   Urinalysis: No results for input(s): COLORURINE, LABSPEC, PHURINE, GLUCOSEU, HGBUR, BILIRUBINUR, KETONESUR, PROTEINUR, UROBILINOGEN, NITRITE, LEUKOCYTESUR in the last 72 hours.  Invalid input(s): APPERANCEUR    Imaging: DG Pelvis 1-2 Views  Result Date: 02/24/2021 CLINICAL DATA:  Right hip pain, known soft tissue fluid collection along the lateral aspect of the known right hip prosthesis EXAM: PELVIS - 1-2 VIEW COMPARISON:  CT from earlier in the same day. FINDINGS: Bilateral hip prostheses are seen. Pelvic ring is intact. No acute fracture or dislocation is noted. Diffuse vascular calcifications are seen. IMPRESSION: No acute abnormality noted. Electronically Signed   By: Inez Catalina M.D.   On: 02/24/2021 20:23   CT FEMUR LEFT W CONTRAST  Result Date: 02/26/2021 CLINICAL DATA:  Concern for soft tissue infection. Thigh swelling. Left leg pain. EXAM: CT OF THE LOWER RIGHT EXTREMITY WITH CONTRAST TECHNIQUE: Multidetector CT imaging of the lower right extremity was performed according to the standard protocol following intravenous contrast administration. CONTRAST:  158mL OMNIPAQUE IOHEXOL 300 MG/ML  SOLN COMPARISON:  CT pelvis 02/24/2021 FINDINGS: Bones/Joint/Cartilage Left total hip arthroplasty without hardware failure or complication. Beam hardening artifact resulting from the orthopedic hardware partially obscures adjacent soft tissue and osseous structures. No fracture or dislocation. Normal alignment. No joint effusion. 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 Ligaments Ligaments are suboptimally evaluated by CT. Muscles and Tendons Generalized muscle atrophy of the extensor compartment muscles. Severe atrophy of the flexor compartment muscles. No intramuscular fluid collection or hematoma. Soft tissue No fluid collection or hematoma. No soft tissue  mass. Soft tissue edema in the subcutaneous fat of the left thigh most severe along the lateral aspect with skin thickening and extending into the pelvis most consistent with cellulitis. No drainable fluid collection to suggest an abscess. IMPRESSION: 1. Soft tissue edema in the subcutaneous fat of the left thigh most severe along the lateral aspect with skin thickening and extending into the pelvis most consistent with cellulitis. No drainable fluid collection to suggest an abscess. Electronically Signed   By: Kathreen Devoid M.D.   On: 02/26/2021 09:37     Medications:      apixaban  5 mg Oral BID   atorvastatin  40 mg Oral QHS   epoetin (EPOGEN/PROCRIT) injection  6,000 Units Intravenous Q T,Th,Sa-HD   feeding supplement (NEPRO CARB STEADY)  237 mL Oral TID BM  finasteride  5 mg Oral Daily   lidocaine  1 patch Transdermal Q24H   metoprolol tartrate  12.5 mg Oral BID   midodrine  10 mg Oral TID WC   multivitamin  1 tablet Oral QHS   rOPINIRole  1 mg Oral QHS   sevelamer carbonate  1,600 mg Oral Once per day on Sun Mon Wed Fri   sevelamer carbonate  1,600 mg Oral BID WC   sodium chloride flush  10 mL Intravenous Q12H   vitamin B-12  1,000 mcg Oral Daily      Assessment/ Plan:  Antonio Phillips is a 75 y.o.  male with past medical history including hypotension, obesity, peripheral artery disease, diabetes, hyperlipidemia, and end-stage renal disease on dialysis.  Patient presents to emergency department with abdominal pain persisting for 2 days.  Patient admitted for further evaluation for NSTEMI, initial episode of care ([place]) [I21.4] Fever, unspecified fever cause [R50.9] Altered mental status, unspecified altered mental status type [J09.32] Acute metabolic encephalopathy [I71.24] Acute congestive heart failure, unspecified heart failure type ([place]) [I50.9]  [facility]/TTS/Lt AVF/96kg   End-stage renal disease on HD TTS.  Received dialysis yesterday, UF goal 1L  removed. Next treatment scheduled for Saturday  2. Anemia of chronic kidney disease Lab Results  Component Value Date   HGB 8.5 (L) 02/26/2021   Hgb remains below target Continue Epogen 6000 units IV with dialysis.  3. Secondary Hyperparathyroidism: Lab Results  Component Value Date   CALCIUM 8.4 (L) 02/26/2021   PHOS 3.4 02/26/2021  We will continue to monitor.   4.  Fever. Evaluating infectious source, possible dry wound on Lt thigh. CT completed this am. Afebrile Hold antibiotics.     LOS: 5 Estha Few 12/23/20221:28 PM

## 2021-02-26 NOTE — Care Management Important Message (Signed)
Important Message  Patient Details  Name: Antonio Phillips MRN: [id number] Date of Birth: [birth year]   Medicare Important Message Given:  Yes  Patient is in an isolation room so I reviewed his Important Message from Medicare by phone [phone] and he stated he understood.  I asked if he would like me to send him a copy but he declined. I wished him well and thanked him for his time.   Antonio Phillips 02/26/2021, 1:48 PM

## 2021-02-26 NOTE — Progress Notes (Signed)
PROGRESS NOTE   Antonio Phillips  YTK:[id number] DOB: [birth year] DOA: 02/20/2021 PCP: Marsh Dolly, MD  Brief Narrative:  75 year old skilled facility dwelling white male ESRD HD TTS with chronic hypotension on midodrine PVD right BKA left AKA DM TY 2 CAD status post DES to obtuse marginal Permanent A. fib CHADS2 score >4 Eliquis  Recent admission 11/27 through 12/6 choledocholithiasis status post ERCP-brief stepdown stay-patient had biliary duct dilatation removal of 1 stone received 7-day course of Zosyn That admission pertinent for acute superimposed on chronic thrombocytopenia felt secondary to acute illness-at that admission Lyrica dosage increased from 75 to 100 mg daily  Readmit to ICU on 02/21/2021 altered mental status Lactic acid 2.4 troponin 1557-->2463 BNP >4500 CT head negative Code sepsis called started broad-spectrum antibiotics-CT chest not suggestive pneumonia-thought to have  UTI Nephrology consulted for HD, cardiology consulted Echo -12/19--- EF 40-45 % down from prior 60-65% in 09/2020  Eventually found to have possible source of infection in the right with.jprog Scab over the lateral aspect and a large 9 cm abscess layer which was sampled on 12/23 Awaiting Gram stain and culture and then will need to discuss suppressive antibiotics until patient can return to the [place] where he had his prior surgeries done and discuss hardware removal etc. He will require outpatient coordination with gastroenterologist Dr. Allen Norris With regards to plastic stent removal still has left >right sided pain  Hospital-Problem based course  Toxic metabolic encephalopathy secondary to possible infection Mental status is stable-- note patient had been recently placed on higher dosing of Lyrica Careful dosing Dilaudid 1 mg every 4 as needed, Nor likely co for mild to moderate pain 9 cm X 2 cm X 3 cm abscess of right hip Cellulitis on left hip and thigh Probable low-grade DIC picture with  thrombocytopenia from chronic infection Initial diagnosis entertained was UTI--received cefepime 12/17 through 12/19, blood culture NGTD 12/17 CT 12/21 =R hip abscess as above, CT 12/23 left hip shows mainly soft tissue swelling no abscess Orthopedics input appreciated-IR consulted for sampling of abscess collection --gram stain and culture await [note-is on eliquis for AFIB]-appreciate ID input in advance Recent admission choledocholithiasis s/p ERCP stone removal completing 7 days Zosyn No concerns at this time no abdominal pain CAD status post DES 09/2020 previously on Plavix/aspirin 81 completed--demand type II MI  this admission troponin peak 2400 Permanent A. fib CHADS2 score >4 HFrEF t-Echo this admission EF 40-45% (similar to 09/2020) continue low-dose of metoprolol 12.5 twice daily Patient is on Eliquis 2.5 twice daily which we will continue Chronic respiratory failure?  Present on admission Patient states has been using oxygen for the past month-we will get a desat screen to see if he actually requires ESRD TTS Continuing Renvela 1.6 twice daily meals, erythropoietin TTS Continuing midodrine 10 tid to maintain blood pressure Rest as per renal PVD with multiple amputations and hip repairs Ideally with secondary prevention would require Plavix Needs outpatient coordination once evaluated in the outpatient at [facility] to determine when this can be resumed Thrombocytopenia?  Acquired-previous baseline 100s currently 39s to 35s Seen by Dr. Janese Banks 02/02/2021 Labs indicate DIC based onimmature platelet function 12.6, LDH 88 which may be concerning for the same  platelets are slightly improved from the 50-80 range Gallstone pancreatitis Had plastic stenting on ERCP 12/1-plastic stent was placed and it was recommended to repeat ERCP to remove the stent off of anticoagulation- CC Gastroenterology Dr. Allen Norris on d/c to ensure not lost to follow-up  DVT prophylaxis: Apixaban Code  Status: Full Family  Communication: None present Disposition:  Status is: Inpatient Further work-up of Hip pathology   Consultants:  Cardiology  Procedures:   Antimicrobials: Cefepime metronidazole 12/17 through 12/19   Subjective:  Coherent 80 No fever no chills No chest pain  Objective: Vitals:   02/26/21 0820 02/26/21 1116 02/26/21 1201 02/26/21 1221  BP: 106/80 133/71 (!) 87/49 (!) 86/53  Pulse: 77 (!) 107 (!) 112 (!) 112  Resp: 16 19    Temp: 98.6 F (37 C) (!) 97.5 F (36.4 C)    TempSrc:      SpO2: 100% 96%  99%  Weight:      Height:        Intake/Output Summary (Last 24 hours) at 02/26/2021 1409 Last data filed at 02/26/2021 0500 Gross per 24 hour  Intake 180 ml  Output 75 ml  Net 105 ml    Filed Weights   02/24/21 0500 02/25/21 0924 02/26/21 0302  Weight: 99.7 kg 101.6 kg 102.1 kg    Examination:  No overt changes to exam  Coherent white male thick neck Mallampati 4 moderate dentition neck soft supple S1-S2 no murmur Abdomen slightly tender with hernia in the midline Wound not examined on  right upper thigh Left leg slightly erythematous with scab over. Moving 4 limbs equally   Data Reviewed: personally reviewed   CBC    Component Value Date/Time   WBC 6.6 02/26/2021 0520   RBC 2.72 (L) 02/26/2021 0520   HGB 8.5 (L) 02/26/2021 0520   HGB 15.2 11/14/2012 2045   HCT 27.3 (L) 02/26/2021 0520   HCT 45.0 11/14/2012 2045   PLT 87 (L) 02/26/2021 0520   PLT 116 (L) 11/14/2012 2045   MCV 100.4 (H) 02/26/2021 0520   MCV 95 11/14/2012 2045   MCH 31.3 02/26/2021 0520   MCHC 31.1 02/26/2021 0520   RDW 21.2 (H) 02/26/2021 0520   RDW 15.2 (H) 11/14/2012 2045   LYMPHSABS 2.1 02/26/2021 0520   MONOABS 0.5 02/26/2021 0520   EOSABS 0.2 02/26/2021 0520   BASOSABS 0.0 02/26/2021 0520   CMP Latest Ref Rng & Units 02/26/2021 02/23/2021 02/22/2021  Glucose 70 - 99 mg/dL 91 118(H) 134(H)  BUN 8 - 23 mg/dL 35(H) 57(H) 44(H)  Creatinine 0.61 - 1.24 mg/dL 3.47(H) 6.33(H)  5.51(H)  Sodium 135 - 145 mmol/L 135 137 138  Potassium 3.5 - 5.1 mmol/L 4.7 4.1 3.3(L)  Chloride 98 - 111 mmol/L 99 102 103  CO2 22 - 32 mmol/L 28 22 25   Calcium 8.9 - 10.3 mg/dL 8.4(L) 8.3(L) 8.3(L)  Total Protein 6.5 - 8.1 g/dL - - -  Total Bilirubin 0.3 - 1.2 mg/dL - - -  Alkaline Phos 38 - 126 U/L - - -  AST 15 - 41 U/L - - -  ALT 0 - 44 U/L - - -     Radiology Studies: DG Pelvis 1-2 Views  Result Date: 02/24/2021 CLINICAL DATA:  Right hip pain, known soft tissue fluid collection along the lateral aspect of the known right hip prosthesis EXAM: PELVIS - 1-2 VIEW COMPARISON:  CT from earlier in the same day. FINDINGS: Bilateral hip prostheses are seen. Pelvic ring is intact. No acute fracture or dislocation is noted. Diffuse vascular calcifications are seen. IMPRESSION: No acute abnormality noted. Electronically Signed   By: Inez Catalina M.D.   On: 02/24/2021 20:23   CT FEMUR LEFT W CONTRAST  Result Date: 02/26/2021 CLINICAL DATA:  Concern for soft tissue infection. Thigh swelling. Left  leg pain. EXAM: CT OF THE LOWER RIGHT EXTREMITY WITH CONTRAST TECHNIQUE: Multidetector CT imaging of the lower right extremity was performed according to the standard protocol following intravenous contrast administration. CONTRAST:  185mL OMNIPAQUE IOHEXOL 300 MG/ML  SOLN COMPARISON:  CT pelvis 02/24/2021 FINDINGS: Bones/Joint/Cartilage Left total hip arthroplasty without hardware failure or complication. Beam hardening artifact resulting from the orthopedic hardware partially obscures adjacent soft tissue and osseous structures. No fracture or dislocation. Normal alignment. No joint effusion. 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 Ligaments Ligaments are suboptimally evaluated by CT. Muscles and Tendons Generalized muscle atrophy of the extensor compartment muscles. Severe atrophy of the flexor compartment muscles. No intramuscular fluid collection or  hematoma. Soft tissue No fluid collection or hematoma. No soft tissue mass. Soft tissue edema in the subcutaneous fat of the left thigh most severe along the lateral aspect with skin thickening and extending into the pelvis most consistent with cellulitis. No drainable fluid collection to suggest an abscess. IMPRESSION: 1. Soft tissue edema in the subcutaneous fat of the left thigh most severe along the lateral aspect with skin thickening and extending into the pelvis most consistent with cellulitis. No drainable fluid collection to suggest an abscess. Electronically Signed   By: Kathreen Devoid M.D.   On: 02/26/2021 09:37     Scheduled Meds:  apixaban  5 mg Oral BID   atorvastatin  40 mg Oral QHS   epoetin (EPOGEN/PROCRIT) injection  6,000 Units Intravenous Q T,Th,Sa-HD   feeding supplement (NEPRO CARB STEADY)  237 mL Oral TID BM   finasteride  5 mg Oral Daily   lidocaine  1 patch Transdermal Q24H   metoprolol tartrate  12.5 mg Oral BID   midodrine  10 mg Oral TID WC   multivitamin  1 tablet Oral QHS   rOPINIRole  1 mg Oral QHS   sevelamer carbonate  1,600 mg Oral Once per day on Sun Mon Wed Fri   sevelamer carbonate  1,600 mg Oral BID WC   sodium chloride flush  10 mL Intravenous Q12H   vitamin B-12  1,000 mcg Oral Daily   Continuous Infusions:   LOS: 5 days   Time spent: 18  Nita Sells, MD [facility] To contact the attending provider between 7A-7P or the covering provider during after hours 7P-7A, please log into the web site [email or web address] and access using universal Bernice password for that web site. If you do not have the password, please call the hospital operator.  02/26/2021, 2:09 PM

## 2021-02-26 NOTE — Plan of Care (Signed)

## 2021-02-26 NOTE — Consult Note (Signed)
Pharmacy Antibiotic Note  Antonio Phillips is a 74 y.o. 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 Pharmacy has been consulted for vancomycin and cefepime dosing.  Plan: Vancomycin 2000 mg IV loading dose followed by 1000 mg every TTS post-HD sessions   Cefepime 1 g IV q24h in the evening after HD sessions  Monitor clinical picture and vancomycin levels at steady state F/U C&S, abx deescalation / LOT   Height: 5\' 11"  (180.3 cm) Weight: 102.1 kg (225 lb 1.4 oz) IBW/kg (Calculated) : 75.3  Temp (24hrs), Avg:97.9 F (36.6 C), Min:97.5 F (36.4 C), Max:98.6 F (37 C)  Recent Labs  Lab 02/20/21 1602 02/20/21 2106 02/21/21 0023 02/21/21 0347 02/22/21 0349 02/23/21 0556 02/24/21 0942 02/26/21 0520 02/26/21 1528  WBC 5.4  --   --  5.1  --  4.8 5.6 6.6 6.0  CREATININE 3.94*  --   --  4.56* 5.51* 6.33*  --  3.47*  --   LATICACIDVEN  --  2.4* 1.9  --   --   --   --   --   --     Estimated Creatinine Clearance: 22 mL/min (A) (by C-G formula based on SCr of 3.47 mg/dL (H)).    Allergies  Allergen Reactions   Simvastatin Other (See Comments)    Antimicrobials this admission: 12/23 vancomycin >>  12/23 cefepime >>  12/19 cefepime x 1 12/18 vancomycin x 1  12/17 cefepime/flagyl x 1  Dose adjustments this admission: N/A  Microbiology results: 12/23 Abscess cx: pending  12/23: wound cx: pending  12/17 BCx: NG 12/18 MRSA PCR: negative  Thank you for allowing pharmacy to be a part of this patients care.  Darnelle Bos, PharmD 02/26/2021 6:34 PM

## 2021-02-26 NOTE — Progress Notes (Addendum)
Nursing notified me of melena which was confirmed at bedside on my assessment He seems stable and not in any distress no shortness of breath and blood pressure is stable He does not recall any prior colonoscopy or bleeding  He is on Eliquis for paroxysmal A. Fib Hemoccult is positive hemoglobin is down from 8.5-8.1-threshold for transfusion is below 8 so he has been typed and screened I have reached out to both cardiology and gastroenterology and appreciate their comments with appreciation in advance with regards to timing of possible scope-  I have held his Eliquis placed on Protonix twice daily, 2 large-bore IVs in place and he will be started on saline 50 cc/H Defer timing of possible scope at this time to cardiology and gastroenterology  CRITICAL CARE Performed by: Nita Sells   Total critical care time: 20 minutes  Critical care time was exclusive of separately billable procedures and treating other patients.  Critical care was necessary to treat or prevent imminent or life-threatening deterioration.  Critical care was time spent personally by me on the following activities: development of treatment plan with patient and/or surrogate as well as nursing, discussions with consultants, evaluation of patient's response to treatment, examination of patient, obtaining history from patient or surrogate, ordering and performing treatments and interventions, ordering and review of laboratory studies, ordering and review of radiographic studies, pulse oximetry and re-evaluation of patient's condition.   Verneita Griffes, MD [facility] 4:18 PM

## 2021-02-26 NOTE — Procedures (Signed)
PROCEDURE SUMMARY:  Successful US guided right lateral thigh fluid collection aspiration. Yielded 9 mL of serosanguinous fluid.  No immediate complications.  Pt tolerated well.   Specimen was sent for labs.  EBL < 76mL  Rockney Ghee 02/26/2021 12:33 PM

## 2021-02-26 NOTE — Progress Notes (Signed)
Notified of recent melena in the setting of thrombocytopenia on Eliquis Plavix has been previously held given thrombocytopenia by interventional cardiology  He is a high risk individual with recent amputations, chronic infection right total hip, possible infection left total hip, Recovering from sepsis, gallstone pancreatitis, ERCP stenting, Non-STEMI secondary to sepsis with recommendation for medical management by interventional cardiology Stent placed July 2022 to obtuse marginal #2 Recent amputations lower extremities  Given the above, will hold his Eliquis in the setting of melena Continue to hold Plavix Unless hemodynamically unstable requiring multiple transfusions ,packed red blood cells, would delay EGD/colonoscopy until more stable,  consider outpatient procedure if clinically indicated  Signed, Esmond Plants, MD, Ph.D [facility]

## 2021-02-26 NOTE — Consult Note (Signed)
Vonda Antigua, MD [address], [place], [place], [place], [address] [address], [place], [place], [place], [address] Phone: [phone]  Fax: [phone]  Consultation  Referring Provider:     Dr. Verlon Au Primary Care Physician:  Marsh Dolly, MD Reason for Consultation:     GI bleed  Date of Admission:  02/20/2021 Date of Consultation:  02/26/2021         HPI:   Antonio Phillips is a 75 y.o. male with history of A. fib, on Eliquis, with significantly elevated troponin on this admission in the 2000's, admitted with altered mental status, sepsis, with GI being consulted for GI bleed.  Patient is alert and oriented and did not see his stool himself today.  Flowsheets document type VI black and red large stool at 1500 today.  Brown stool was documented yesterday evening.  No emesis, no hematemesis.  Patient had GI evaluation on recent admission in November 2022 for gallstone pancreatitis at the time, and removal of CBD stone and stent placement at that time.  There was some concern for melena at that time as noted on GI notes, but due to a stable hemoglobin, conservative management was recommended then.  Patient reports history of a remote colonoscopy at the [facility] [place].   Patient denies any abdominal pain, dysphagia, altered bowel habits at home.  Patient has a trash can in the bathroom in his room, that has white paper towels with dark red and some black stool in it.  Last dose of Eliquis was this morning  Past Medical History:  Diagnosis Date   Demand ischemia ([place])    a. 11/2017 elev Trop in setting of AFib/SVT-->Echo [facility]): EF>55%. Mild LVH. Nl RV fxn.   Diabetes ([place])    a. Pt states that he has no hx of diabetes--A1c 5.5 11/2018.   ESRD (end stage renal disease) ([place])    a. On HD since 2019.   Hyperlipidemia    Hypotension    a. On midodrine.   Morbid obesity ([place])    PAD (peripheral artery disease) ([place])    a. s/p L AKA and R BKA.   Permanent atrial  fibrillation ([place])    a. Dx 2019. CHA2DS2VASc = 3-4-->Eliquis.    Past Surgical History:  Procedure Laterality Date   AV FISTULA REPAIR     BELOW KNEE LEG AMPUTATION Bilateral    CHOLECYSTECTOMY     ERCP N/A 02/04/2021   Procedure: ENDOSCOPIC RETROGRADE CHOLANGIOPANCREATOGRAPHY (ERCP);  Surgeon: Lucilla Lame, MD;  Location[facility] ENDOSCOPY;  Service: Endoscopy;  Laterality: N/A;   LEFT HEART CATH AND CORONARY ANGIOGRAPHY N/A 09/22/2020   Procedure: LEFT HEART CATH AND CORONARY ANGIOGRAPHY;  Surgeon: Sherren Mocha, MD;  Location: [facility];  Service: Cardiovascular;  Laterality: N/A;   TOTAL HIP ARTHROPLASTY Bilateral     Prior to Admission medications   Medication Sig Start Date End Date Taking? Authorizing Provider  acetaminophen (TYLENOL) 325 MG tablet Take 2 tablets (650 mg total) by mouth every 6 (six) hours as needed for mild pain or moderate pain. 01/17/20  Yes Wieting, Richard, MD  apixaban (ELIQUIS) 5 MG TABS tablet Take 1 tablet (5 mg total) by mouth 2 (two) times daily. 01/17/20  Yes Wieting, Richard, MD  atorvastatin (LIPITOR) 40 MG tablet Take 40 mg by mouth at bedtime. 09/17/20  Yes [provider]  Carboxymethylcellulose Sod PF 0.5 % SOLN Place 1 drop into both eyes 3 (three) times daily as needed (dry eyes).   Yes  [provider]  clopidogrel (PLAVIX) 75 MG tablet Take 1 tablet (75 mg total) by mouth daily with breakfast. 09/23/20  Yes Wieting, Richard, MD  dextromethorphan-guaiFENesin [facility] DM) 30-600 MG 12hr tablet Take 1 tablet by mouth 2 (two) times daily. 02/09/21  Yes Lorella Nimrod, MD  diclofenac Sodium (VOLTAREN) 1 % GEL Apply 2 g topically in the morning. (Apply to right stump)   Yes [provider]  finasteride (PROSCAR) 5 MG tablet Take 5 mg by mouth daily. 03/13/19  Yes [provider]  HYDROcodone-acetaminophen (NORCO/VICODIN) 5-325 MG tablet Take 1 tablet by mouth every 6 (six) hours as needed for severe pain. 09/23/20   Yes Wieting, Richard, MD  lactulose, encephalopathy, (CHRONULAC) 10 GM/15ML SOLN Take 10 g by mouth 2 (two) times daily as needed (mild constipation).   Yes [provider]  loperamide (IMODIUM A-D) 2 MG tablet Take 2 tablets (4 mg total) by mouth 4 (four) times daily as needed for diarrhea or loose stools. 09/09/19  Yes Carrie Mew, MD  loratadine (CLARITIN) 10 MG tablet Take 10 mg by mouth See admin instructions. Take 10 mg by mouth daily on Monday, Wednesday, Friday and Sunday   Yes [provider]  metoprolol tartrate (LOPRESSOR) 25 MG tablet Take 0.5 tablets (12.5 mg total) by mouth 2 (two) times daily. 02/09/21  Yes Lorella Nimrod, MD  midodrine (PROAMATINE) 10 MG tablet Take 1 tablet (10 mg total) by mouth 3 (three) times daily with meals. 02/09/21  Yes Lorella Nimrod, MD  pregabalin (LYRICA) 100 MG capsule Take 1 capsule (100 mg total) by mouth 2 (two) times daily. 02/09/21  Yes Lorella Nimrod, MD  rOPINIRole (REQUIP) 1 MG tablet Take 1 mg by mouth at bedtime. 03/13/19  Yes [provider]  sevelamer carbonate (RENVELA) 800 MG tablet Take 1,600 mg by mouth See admin instructions. Take 2 tablets (1600mg ) by mouth three times daily with meals on Monday, Wednesday, Friday and Sunday   Yes [provider]  sevelamer carbonate (RENVELA) 800 MG tablet Take 800 mg by mouth See admin instructions. Take 2 tablets (1600mg ) by mouth twice daily with meals (breakfast and dinner) on Tuesday, Thursday and Saturday   Yes [provider]  sodium hypochlorite (DAKIN'S 1/4 STRENGTH) 0.125 % SOLN Apply 1 application topically See admin instructions. Use as directed after cleansing left stump wound - use twice daily and as needed 01/15/21  Yes [provider]  vitamin B-12 (CYANOCOBALAMIN) 1000 MCG tablet Take 1,000 mcg by mouth daily.   Yes [provider]    History reviewed. No pertinent family history.   Social History   Tobacco Use   Smoking  status: Never   Smokeless tobacco: Never  Vaping Use   Vaping Use: Never used  Substance Use Topics   Alcohol use: Never   Drug use: Never    Allergies as of 02/20/2021 - Review Complete 02/20/2021  Allergen Reaction Noted   Simvastatin Other (See Comments) 03/19/2010    Review of Systems:    All systems reviewed and negative except where noted in HPI.   Physical Exam:  Constitutional: General:   Alert,  Well-developed, well-nourished, pleasant and cooperative in NAD BP 118/77 (BP Location: Right Arm)    Pulse 78    Temp 97.7 F (36.5 C) (Oral)    Resp 19    Ht 5\' 11"  (1.803 m)    Wt 102.1 kg    SpO2 99%    BMI 31.39 kg/m   Eyes:  Sclera  clear, no icterus.   Conjunctiva pink. PERRLA  Ears:  No scars, lesions or masses, Normal auditory acuity. Nose:  No deformity, discharge, or lesions. Mouth:  No deformity or lesions, oropharynx pink & moist.  Neck:  Supple; no masses or thyromegaly.  Respiratory: Normal respiratory effort, Normal percussion  Gastrointestinal:  Normal bowel sounds.  No bruits.  Soft, non-tender and non-distended without masses, hepatosplenomegaly or hernias noted.  No guarding or rebound tenderness.     Cardiac: No clubbing or edema.  No cyanosis. Normal posterior tibial pedal pulses noted.  Lymphatic:  No significant cervical or axillary adenopathy.  Psych:  Alert and cooperative. Normal mood and affect.  Musculoskeletal:  Normal gait. Head normocephalic, atraumatic. Symmetrical without gross deformities. 5/5 Upper and Lower extremity strength bilaterally.  Skin: Warm. Intact without significant lesions or rashes. No jaundice.  Neurologic:  Face symmetrical, tongue midline, Normal sensation to touch;  grossly normal neurologically.  Psych:  Alert and oriented x3, Alert and cooperative. Normal mood and affect.   LAB RESULTS: Recent Labs    02/24/21 0942 02/26/21 0520  WBC 5.6 6.6  HGB 9.2* 8.5*  HCT 29.2* 27.3*  PLT 60* 87*   BMET Recent  Labs    02/26/21 0520  NA 135  K 4.7  CL 99  CO2 28  GLUCOSE 91  BUN 35*  CREATININE 3.47*  CALCIUM 8.4*   LFT Recent Labs    02/26/21 0520  ALBUMIN 2.3*   PT/INR No results for input(s): LABPROT, INR in the last 72 hours.  STUDIES: DG Pelvis 1-2 Views  Result Date: 02/24/2021 CLINICAL DATA:  Right hip pain, known soft tissue fluid collection along the lateral aspect of the known right hip prosthesis EXAM: PELVIS - 1-2 VIEW COMPARISON:  CT from earlier in the same day. FINDINGS: Bilateral hip prostheses are seen. Pelvic ring is intact. No acute fracture or dislocation is noted. Diffuse vascular calcifications are seen. IMPRESSION: No acute abnormality noted. Electronically Signed   By: Inez Catalina M.D.   On: 02/24/2021 20:23   CT FEMUR LEFT W CONTRAST  Result Date: 02/26/2021 CLINICAL DATA:  Concern for soft tissue infection. Thigh swelling. Left leg pain. EXAM: CT OF THE LOWER RIGHT EXTREMITY WITH CONTRAST TECHNIQUE: Multidetector CT imaging of the lower right extremity was performed according to the standard protocol following intravenous contrast administration. CONTRAST:  160mL OMNIPAQUE IOHEXOL 300 MG/ML  SOLN COMPARISON:  CT pelvis 02/24/2021 FINDINGS: Bones/Joint/Cartilage Left total hip arthroplasty without hardware failure or complication. Beam hardening artifact resulting from the orthopedic hardware partially obscures adjacent soft tissue and osseous structures. No fracture or dislocation. Normal alignment. No joint effusion. 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 Ligaments Ligaments are suboptimally evaluated by CT. Muscles and Tendons Generalized muscle atrophy of the extensor compartment muscles. Severe atrophy of the flexor compartment muscles. No intramuscular fluid collection or hematoma. Soft tissue No fluid collection or hematoma. No soft tissue mass. Soft tissue edema in the subcutaneous fat of the left thigh  most severe along the lateral aspect with skin thickening and extending into the pelvis most consistent with cellulitis. No drainable fluid collection to suggest an abscess. IMPRESSION: 1. Soft tissue edema in the subcutaneous fat of the left thigh most severe along the lateral aspect with skin thickening and extending into the pelvis most consistent with cellulitis. No drainable fluid collection to suggest an abscess. Electronically Signed   By: Kathreen Devoid M.D.   On: 02/26/2021 09:37  Impression / Plan:   KELVIN SENNETT is a 75 y.o. y/o male with sepsis, with right hip abscess, status post drainage, significantly elevated troponin, currently on Eliquis, with GI being consulted for GI bleed  There was suspicion of blood in his stool on his last admission but due to stable hemoglobin, this was managed conservatively at the time  With clear evidence of GI bleed at this time, with stool containing both black and dark red, EGD and colonoscopy would help evaluate this further  I did request cardiology clearance given his significantly elevated troponin.  Dr. Rockey Situ is already following the patient and states that given his ongoing issues, he is not at low risk for endoscopic procedures.  Because of this, he questions if stopping his Eliquis and watchful waiting may be a better option at this time for this patient.  I discussed with him that if he would be at lower risk for endoscopic procedures from a cardiac standpoint, in the near future in the next few weeks as opposed to now, and he does not have any further bleeding after stopping Eliquis, watchful waiting at this time may be a safer option for this patient  Dr. Rockey Situ will evaluate the patient and leave a note in his chart with his thoughts as well  We will continue to monitor the patient carefully.  This is a complicated picture and risks and benefits of procedure and appropriate timeline will need to be determined based on clinical  symptoms and stability  Repeat CBC is 8.1 on repeat after the episode of GI bleed today, which is quite stable compared to this morning at 8.5.  With hemodynamic stability and a stable hemoglobin at this time, and patient being high risk for procedures, risks of procedure may outweigh benefits at this time given his current medical issues  Up-to-date recommends Protonix 80 mg x 1 initially in the setting of active bleeding and I have ordered this.  After this patient will be on 40 mg IV twice daily  He is currently hemodynamically stable  PPI IV twice daily  Continue serial CBCs and transfuse PRN Avoid NSAIDs Maintain 2 large-bore IV lines Please page GI with any acute hemodynamic changes, or signs of active GI bleeding  However, if patient becomes hemodynamically unstable, procedure may need to be done more emergently, despite Eliquis still being in his system since last dose was this morning  Ideally, patient will need both EGD and colonoscopy for further evaluation of his symptoms when safe to proceed from a cardiac standpoint  Will continue to follow closely  Can start clear liquid diet if patient does not have any further episodes of GI bleeding  Above discussed with Dr. Rockey Situ and Dr. Verlon Au    Thank you for involving me in the care of this patient.      LOS: 5 days   Virgel Manifold, MD  02/26/2021, 3:59 PM

## 2021-02-26 NOTE — Progress Notes (Signed)
Patient complained of bilateral leg pain at 8/10. Dilaudid administered at 1pm. Will follow up/ reassess.

## 2021-02-27 DIAGNOSIS — I4891 Unspecified atrial fibrillation: Secondary | ICD-10-CM

## 2021-02-27 DIAGNOSIS — D62 Acute posthemorrhagic anemia: Secondary | ICD-10-CM | POA: Diagnosis not present

## 2021-02-27 DIAGNOSIS — G9341 Metabolic encephalopathy: Secondary | ICD-10-CM | POA: Diagnosis not present

## 2021-02-27 DIAGNOSIS — D6869 Other thrombophilia: Secondary | ICD-10-CM

## 2021-02-27 DIAGNOSIS — I214 Non-ST elevation (NSTEMI) myocardial infarction: Secondary | ICD-10-CM | POA: Diagnosis not present

## 2021-02-27 DIAGNOSIS — I4821 Permanent atrial fibrillation: Secondary | ICD-10-CM

## 2021-02-27 DIAGNOSIS — I2510 Atherosclerotic heart disease of native coronary artery without angina pectoris: Secondary | ICD-10-CM

## 2021-02-27 DIAGNOSIS — K921 Melena: Secondary | ICD-10-CM | POA: Diagnosis not present

## 2021-02-27 LAB — CBC WITH DIFFERENTIAL/PLATELET
Abs Immature Granulocytes: 0.06 10*3/uL (ref 0.00–0.07)
Basophils Absolute: 0 10*3/uL (ref 0.0–0.1)
Basophils Relative: 1 %
Eosinophils Absolute: 0.2 10*3/uL (ref 0.0–0.5)
Eosinophils Relative: 3 %
HCT: 26.3 % — ABNORMAL LOW (ref 39.0–52.0)
Hemoglobin: 8 g/dL — ABNORMAL LOW (ref 13.0–17.0)
Immature Granulocytes: 1 %
Lymphocytes Relative: 37 %
Lymphs Abs: 2.2 10*3/uL (ref 0.7–4.0)
MCH: 30.7 pg (ref 26.0–34.0)
MCHC: 30.4 g/dL (ref 30.0–36.0)
MCV: 100.8 fL — ABNORMAL HIGH (ref 80.0–100.0)
Monocytes Absolute: 0.5 10*3/uL (ref 0.1–1.0)
Monocytes Relative: 8 %
Neutro Abs: 3 10*3/uL (ref 1.7–7.7)
Neutrophils Relative %: 50 %
Platelets: 105 10*3/uL — ABNORMAL LOW (ref 150–400)
RBC: 2.61 MIL/uL — ABNORMAL LOW (ref 4.22–5.81)
RDW: 21.3 % — ABNORMAL HIGH (ref 11.5–15.5)
Smear Review: DECREASED
WBC: 5.9 10*3/uL (ref 4.0–10.5)
nRBC: 0 % (ref 0.0–0.2)

## 2021-02-27 LAB — COMPREHENSIVE METABOLIC PANEL
ALT: 11 U/L (ref 0–44)
AST: 15 U/L (ref 15–41)
Albumin: 2.5 g/dL — ABNORMAL LOW (ref 3.5–5.0)
Alkaline Phosphatase: 100 U/L (ref 38–126)
Anion gap: 10 (ref 5–15)
BUN: 50 mg/dL — ABNORMAL HIGH (ref 8–23)
CO2: 27 mmol/L (ref 22–32)
Calcium: 8.6 mg/dL — ABNORMAL LOW (ref 8.9–10.3)
Chloride: 97 mmol/L — ABNORMAL LOW (ref 98–111)
Creatinine, Ser: 4.24 mg/dL — ABNORMAL HIGH (ref 0.61–1.24)
GFR, Estimated: 14 mL/min — ABNORMAL LOW (ref >60)
Glucose, Bld: 90 mg/dL (ref 70–99)
Potassium: 5.6 mmol/L — ABNORMAL HIGH (ref 3.5–5.1)
Sodium: 134 mmol/L — ABNORMAL LOW (ref 135–145)
Total Bilirubin: 1.3 mg/dL — ABNORMAL HIGH (ref 0.3–1.2)
Total Protein: 5.5 g/dL — ABNORMAL LOW (ref 6.5–8.1)

## 2021-02-27 LAB — PREPARE RBC (CROSSMATCH)

## 2021-02-27 LAB — TROPONIN I (HIGH SENSITIVITY): Troponin I (High Sensitivity): 336 ng/L (ref <18)

## 2021-02-27 MED ORDER — EPOETIN ALFA 10000 UNIT/ML IJ SOLN
INTRAMUSCULAR | Status: AC
  Filled 2021-02-27: qty 1

## 2021-02-27 MED ORDER — SODIUM CHLORIDE 0.9% IV SOLUTION: Freq: Once | INTRAVENOUS | Status: DC

## 2021-02-27 MED ORDER — NITROGLYCERIN 0.4 MG SL SUBL
0.4000 mg | SUBLINGUAL_TABLET | SUBLINGUAL | Status: DC | PRN
  Administered 2021-02-27 – 2021-02-28 (×2): 0.4 mg via SUBLINGUAL
  Filled 2021-02-27 (×2): qty 1

## 2021-02-27 NOTE — Progress Notes (Signed)
Pt BP 91/59 (69) pulse at 122- pt asymptomatic, UF off

## 2021-02-27 NOTE — Progress Notes (Signed)
Pt c/o 7/10 chest pain post HR/ pt states this happens sometimes after HD/ VSS/ no changes on tele/ MD made aware/ EKG and troponin ordered/ 1 SL nitro given/ pain relived/ will monitor close

## 2021-02-27 NOTE — Progress Notes (Signed)
[facility]  ROUNDING NOTE   Subjective:   Seen and examined on hemodialysis. Tolerating treatment well. UF goal of 1 liter.   +Hard of hearing.   Black tarry stool yesterday. Apixaban being held    HEMODIALYSIS FLOWSHEET:  Blood Flow Rate (mL/min): 400 mL/min Arterial Pressure (mmHg): 150 mmHg Venous Pressure (mmHg): 140 mmHg Transmembrane Pressure (mmHg): 70 mmHg Ultrafiltration Rate (mL/min): 430 mL/min Dialysate Flow Rate (mL/min): 600 ml/min Conductivity: Machine : 13.8 Conductivity: Machine : 13.8 Dialysis Fluid Bolus: Normal Saline Bolus Amount (mL): 250 mL   Objective:  Vital signs in last 24 hours:  Temp:  [97.5 F (36.4 C)-98.2 F (36.8 C)] 97.6 F (36.4 C) (12/24 0834) Pulse Rate:  [77-112] 92 (12/24 0945) Resp:  [11-20] 12 (12/24 0945) BP: (86-133)/(49-77) 94/67 (12/24 0945) SpO2:  [96 %-100 %] 100 % (12/24 0945) Weight:  [102 kg-102.8 kg] 102 kg (12/24 0845)  Weight change: 1.2 kg Filed Weights   02/27/21 0031 02/27/21 0431 02/27/21 0845  Weight: 102.8 kg 102.8 kg 102 kg    Intake/Output: I/O last 3 completed shifts: In: 480 [P.O.:180; I.V.:300] Out: 375 [Urine:375]   Intake/Output this shift:  No intake/output data recorded.  Physical Exam: General: NAD, laying in bed  Head: Normocephalic, atraumatic. Moist oral mucosal membranes  Eyes: Anicteric  Lungs:  +wheezing  Heart: Irregular  Abdomen:  Soft, obese  Extremities: No peripheral edema.  Right BKA left AKA  Neurologic: Nonfocal, moving all four extremities  Skin: No lesions  Access: Left AVF    Basic Metabolic Panel: Recent Labs  Lab 02/21/21 0347 02/22/21 0349 02/23/21 0556 02/26/21 0520 02/27/21 0630  NA 141 138 137 135 134*  K 3.2* 3.3* 4.1 4.7 5.6*  CL 104 103 102 99 97*  CO2 25 25 22 28 27   GLUCOSE 93 134* 118* 91 90  BUN 27* 44* 57* 35* 50*  CREATININE 4.56* 5.51* 6.33* 3.47* 4.24*  CALCIUM 8.7* 8.3* 8.3* 8.4* 8.6*  MG 1.9 1.8 1.8  --   --   PHOS 3.8  4.5 5.6* 3.4  --      Liver Function Tests: Recent Labs  Lab 02/20/21 1602 02/23/21 0556 02/26/21 0520 02/27/21 0630  AST 18  --   --  15  ALT 9  --   --  11  ALKPHOS 150*  --   --  100  BILITOT 2.2*  --   --  1.3*  PROT 6.8  --   --  5.5*  ALBUMIN 3.1* 2.3* 2.3* 2.5*    No results for input(s): LIPASE, AMYLASE in the last 168 hours.  Recent Labs  Lab 02/21/21 0042  AMMONIA 22     CBC: Recent Labs  Lab 02/20/21 1602 02/21/21 0347 02/23/21 0556 02/24/21 0942 02/26/21 0520 02/26/21 1528 02/27/21 0630  WBC 5.4   < > 4.8 5.6 6.6 6.0 5.9  NEUTROABS 4.0  --   --  2.8 3.7 3.5 3.0  HGB 11.3*   < > 8.7* 9.2* 8.5* 8.1* 8.0*  HCT 36.7*   < > 28.2* 29.2* 27.3* 26.1* 26.3*  MCV 97.6   < > 98.9 97.7 100.4* 100.0 100.8*  PLT 68*   < > 55* 60* 87* 91* 105*   < > = values in this interval not displayed.     Cardiac Enzymes: No results for input(s): CKTOTAL, CKMB, CKMBINDEX, TROPONINI in the last 168 hours.  BNP: Invalid input(s): POCBNP  CBG: Recent Labs  Lab 02/21/21 0115  GLUCAP 105*  Microbiology: Results for orders placed or performed during the hospital encounter of 02/20/21  Resp Panel by RT-PCR (Flu A&B, Covid) Nasopharyngeal Swab     Status: None   Collection Time: 02/20/21  2:49 PM   Specimen: Nasopharyngeal Swab; Nasopharyngeal(NP) swabs in vial transport medium  Result Value Ref Range Status   SARS Coronavirus 2 by RT PCR NEGATIVE NEGATIVE Final    Comment: (NOTE) SARS-CoV-2 target nucleic acids are NOT DETECTED.  The SARS-CoV-2 RNA is generally detectable in upper respiratory specimens during the acute phase of infection. The lowest concentration of SARS-CoV-2 viral copies this assay can detect is 138 copies/mL. A negative result does not preclude SARS-Cov-2 infection and should not be used as the sole basis for treatment or other patient management decisions. A negative result may occur with  improper specimen collection/handling,  submission of specimen other than nasopharyngeal swab, presence of viral mutation(s) within the areas targeted by this assay, and inadequate number of viral copies(<138 copies/mL). A negative result must be combined with clinical observations, patient history, and epidemiological information. The expected result is Negative.  Fact Sheet for Patients:  EntrepreneurPulse.com.au  Fact Sheet for Healthcare Providers:  IncredibleEmployment.be  This test is no t yet approved or cleared by the [place] FDA and  has been authorized for detection and/or diagnosis of SARS-CoV-2 by FDA under an Emergency Use Authorization (EUA). This EUA will remain  in effect (meaning this test can be used) for the duration of the COVID-19 declaration under Section 564(b)(1) of the Act, 21 U.S.C.section 360bbb-3(b)(1), unless the authorization is terminated  or revoked sooner.       Influenza A by PCR NEGATIVE NEGATIVE Final   Influenza B by PCR NEGATIVE NEGATIVE Final    Comment: (NOTE) The Xpert Xpress SARS-CoV-2/FLU/RSV plus assay is intended as an aid in the diagnosis of influenza from Nasopharyngeal swab specimens and should not be used as a sole basis for treatment. Nasal washings and aspirates are unacceptable for Xpert Xpress SARS-CoV-2/FLU/RSV testing.  Fact Sheet for Patients: EntrepreneurPulse.com.au  Fact Sheet for Healthcare Providers: IncredibleEmployment.be  This test is not yet approved or cleared by the [place] FDA and has been authorized for detection and/or diagnosis of SARS-CoV-2 by FDA under an Emergency Use Authorization (EUA). This EUA will remain in effect (meaning this test can be used) for the duration of the COVID-19 declaration under Section 564(b)(1) of the Act, 21 U.S.C. section 360bbb-3(b)(1), unless the authorization is terminated or revoked.  Performed at [facility], [address] [place], [place] [address]   Culture, blood (single)     Status: None   Collection Time: 02/20/21  9:05 PM   Specimen: BLOOD  Result Value Ref Range Status   Specimen Description BLOOD BLOOD RIGHT FOREARM  Final   Special Requests   Final    BOTTLES DRAWN AEROBIC AND ANAEROBIC Blood Culture results may not be optimal due to an excessive volume of blood received in culture bottles   Culture   Final    NO GROWTH 5 DAYS Performed at [facility], [address] [place], [place] [address]    Report Status 02/25/2021 FINAL  Final  MRSA Next Gen by PCR, Nasal     Status: None   Collection Time: 02/21/21  7:39 AM   Specimen: Nasal Mucosa; Nasal Swab  Result Value Ref Range Status   MRSA by PCR Next Gen NOT DETECTED NOT DETECTED Final    Comment: (NOTE) The GeneXpert MRSA Assay (FDA  approved for NASAL specimens only), is one component of a comprehensive MRSA colonization surveillance program. It is not intended to diagnose MRSA infection nor to guide or monitor treatment for MRSA infections. Test performance is not FDA approved in patients less than 26 years old. Performed at [facility], [address] [place], [place] [address]   Aerobic Culture w Gram Stain (superficial specimen)     Status: None (Preliminary result)   Collection Time: 02/25/21  5:25 PM   Specimen: Joint, Hip; Wound  Result Value Ref Range Status   Specimen Description   Final    HIP RIGHT HIP WOUND Performed at [facility], [address] [place], [place] [address]    Special Requests   Final    NONE Performed at [facility], [address] [place], [place] [address]    Gram Stain   Final    FEW WBC PRESENT, PREDOMINANTLY MONONUCLEAR NO ORGANISMS SEEN    Culture   Final    NO GROWTH < 24 HOURS Performed at [place] [address], [place], [place] [address]    Report Status PENDING  Incomplete  Anaerobic culture w Gram Stain      Status: None (Preliminary result)   Collection Time: 02/26/21 12:54 PM   Specimen: Fluid; Abscess  Result Value Ref Range Status   Specimen Description   Final    FLUID Performed at [facility], [address] [place], [place] [address]    Special Requests   Final    NONE Performed at [facility], [address] [place], [place] [address]    Gram Stain   Final    RARE WBC PRESENT, PREDOMINANTLY MONONUCLEAR NO ORGANISMS SEEN Performed at [facility], [place] [address] [place], [place] [address]    Culture PENDING  Incomplete   Report Status PENDING  Incomplete    Coagulation Studies: No results for input(s): LABPROT, INR in the last 72 hours.   Urinalysis: No results for input(s): COLORURINE, LABSPEC, PHURINE, GLUCOSEU, HGBUR, BILIRUBINUR, KETONESUR, PROTEINUR, UROBILINOGEN, NITRITE, LEUKOCYTESUR in the last 72 hours.  Invalid input(s): APPERANCEUR    Imaging: CT FEMUR LEFT W CONTRAST  Result Date: 02/26/2021 CLINICAL DATA:  Concern for soft tissue infection. Thigh swelling. Left leg pain. EXAM: CT OF THE LOWER RIGHT EXTREMITY WITH CONTRAST TECHNIQUE: Multidetector CT imaging of the lower right extremity was performed according to the standard protocol following intravenous contrast administration. CONTRAST:  138mL OMNIPAQUE IOHEXOL 300 MG/ML  SOLN COMPARISON:  CT pelvis 02/24/2021 FINDINGS: Bones/Joint/Cartilage Left total hip arthroplasty without hardware failure or complication. Beam hardening artifact resulting from the orthopedic hardware partially obscures adjacent soft tissue and osseous structures. No fracture or dislocation. Normal alignment. No joint effusion. 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 Ligaments Ligaments are suboptimally evaluated by CT. Muscles and Tendons Generalized muscle atrophy of the extensor compartment muscles. Severe atrophy of the flexor compartment muscles. No  intramuscular fluid collection or hematoma. Soft tissue No fluid collection or hematoma. No soft tissue mass. Soft tissue edema in the subcutaneous fat of the left thigh most severe along the lateral aspect with skin thickening and extending into the pelvis most consistent with cellulitis. No drainable fluid collection to suggest an abscess. IMPRESSION: 1. Soft tissue edema in the subcutaneous fat of the left thigh most severe along the lateral aspect with skin thickening and extending into the pelvis most consistent with cellulitis. No drainable  fluid collection to suggest an abscess. Electronically Signed   By: Kathreen Devoid M.D.   On: 02/26/2021 09:37   [place] IMAGE GUIDED DRAINAGE BY PERCUTANEOUS CATHETER  Result Date: 02/26/2021 INDICATION: Patient with history of right hip arthroplasty and recent complaints of right lateral thigh open wound drainage, recent imaging revealed fluid collection along the lateral aspect of the vastus lateralis muscle concerning for abscess. Request received for diagnostic aspiration. EXAM: RIGHT THIGH FLUID COLLECTION ASPIRATION UNDER ULTRASOUND MEDICATIONS: Local 1% Lidocaine only. ANESTHESIA/SEDATION: Local 1% Lidocaine only. COMPLICATIONS: None immediate. PROCEDURE: Risks and benefits of the procedure were discussed today with the patient/patient's legal guardian, all questions were answered and informed written consent was obtained from the patient/patient's legal guardian prior to the procedure. Potential complications that were discussed included, but are not limited to: introduction of infection, bleeding, and inability to aspirate fluid. The patient/patient's legal guardian voiced understanding of these risks and agreed to proceed. The usual time-out protocol was performed immediately prior to the procedure. The appropriate skin site for needle placement was chosen with the aid of ultrasound. Using the usual sterile technique with chlorhexidine and lidocaine 1% as local  anesthetic, a Yueh catheter was advanced into the right lateral thigh fluid collection under ultrasound guidance. Approximately 9 cc of serosanguineous colored fluid was aspirated and sent to the laboratory for the requested studies. The needle was removed in its entirety and a sterile bandage was applied. The patient tolerated the procedure well without apparent immediate complications and was discharged from the radiology department in good condition. IMPRESSION: Technically successful ultrasound guided right thigh fluid collection aspiration. This exam was performed by Tsosie Billing PA-C, and was supervised and interpreted by Dr. Pascal Lux. Electronically Signed   By: Sandi Mariscal M.D.   On: 02/26/2021 16:20     Medications:    sodium chloride 50 mL/hr at 02/26/21 2234   ceFEPime (MAXIPIME) IV 1 g (02/26/21 2029)   vancomycin       atorvastatin  40 mg Oral QHS   epoetin (EPOGEN/PROCRIT) injection  6,000 Units Intravenous Q T,Th,Sa-HD   feeding supplement (NEPRO CARB STEADY)  237 mL Oral TID BM   finasteride  5 mg Oral Daily   lidocaine  1 patch Transdermal Q24H   metoprolol tartrate  12.5 mg Oral BID   midodrine  10 mg Oral TID WC   multivitamin  1 tablet Oral QHS   pantoprazole (PROTONIX) IV  40 mg Intravenous Q12H   rOPINIRole  1 mg Oral QHS   sevelamer carbonate  1,600 mg Oral Once per day on Sun Mon Wed Fri   sevelamer carbonate  1,600 mg Oral BID WC   sodium chloride flush  10 mL Intravenous Q12H   vitamin B-12  1,000 mcg Oral Daily      Assessment/ Plan:  Antonio Phillips is a 75 y.o. white male with end stage renal disease on hemodialysis, peripheral vascular disease, hypotension, diabetes mellitus type II, hyperlipidemia, right BKA and left AKA who is admitted to [facility] on 02/20/2021 for NSTEMI, initial episode of care [facility]) [I21.4] Fever, unspecified fever cause [R50.9] Altered mental status, unspecified altered mental status type [X41.28] Acute metabolic encephalopathy  [N86.76] Acute congestive heart failure, unspecified heart failure type ([place]) [I50.9]  [place] left AVF 96kg   End-stage renal disease on HD TTS.  Seen and examined on hemodialysis treatment.   2. Anemia of chronic kidney disease: now with GI bleed.  Lab Results  Component Value Date  HGB 8.0 (L) 02/27/2021   Continue EPO with HD treatment.  Appreciate GI input.   3. Secondary Hyperparathyroidism: Lab Results  Component Value Date   CALCIUM 8.6 (L) 02/27/2021   PHOS 3.4 02/26/2021  Continue sevelamer with meals.   4.  Hypotension: 107/64. Continue midodrine.   5. Cellulitis: afebrile.  Continue cefepime and vancomycin Appreciate Infectious Disease    LOS: 6 Terriann Difonzo 12/24/202210:33 AM

## 2021-02-27 NOTE — Progress Notes (Signed)
Antonio Antigua, MD [address] [place], [place], [place], [address] [address], [place], [name], [place], [address] Phone: [phone]  Fax: [phone]   Subjective: Patient evaluated during hemodialysis today.  Denies any further bowel movement since the 1 yesterday.  No abdominal pain   Objective: Exam: Vital signs in last 24 hours: Vitals:   02/27/21 1145 02/27/21 1200 02/27/21 1206 02/27/21 1215  BP: 102/65 106/61 97/63 101/65  Pulse: (!) 135 (!) 132 (!) 102   Resp: 16 17 14 17   Temp:    98.2 F (36.8 C)  TempSrc:    Oral  SpO2: 100% 100% 100%   Weight:   103 kg   Height:       Weight change: 1.2 kg  Intake/Output Summary (Last 24 hours) at 02/27/2021 1335 Last data filed at 02/27/2021 1206 Gross per 24 hour  Intake 300 ml  Output 993 ml  Net -693 ml    Constitutional: General:   Alert,  Well-developed, well-nourished, pleasant and cooperative in NAD BP 101/65    Pulse (!) 102    Temp 98.2 F (36.8 C) (Oral)    Resp 17    Ht 5\' 11"  (1.803 m)    Wt 103 kg    SpO2 100%    BMI 31.67 kg/m   Eyes:  Sclera clear, no icterus.   Conjunctiva pink.   Ears:  No scars, lesions or masses, Normal auditory acuity. Nose:  No deformity, discharge, or lesions. Mouth:  No deformity or lesions, oropharynx pink & moist.  Neck:  Supple; no masses, trachea midline  Respiratory: Normal respiratory effort  Gastrointestinal:  Soft, non-tender and non-distended without masses, hepatosplenomegaly or hernias noted.  No guarding or rebound tenderness.     Cardiac: No clubbing or edema.  No cyanosis. Normal posterior tibial pedal pulses noted.  Lymphatic:  No significant cervical adenopathy.  Psych:  Alert and cooperative. Normal mood and affect.  Musculoskeletal:  Head normocephalic, atraumatic.  Skin: Warm. Intact without significant lesions or rashes. No jaundice.  Neurologic:  Face symmetrical, tongue midline, Normal sensation to touch  Psych:  Alert and  oriented x3, Alert and cooperative. Normal mood and affect.   Lab Results: Lab Results  Component Value Date   WBC 5.9 02/27/2021   HGB 8.0 (L) 02/27/2021   HCT 26.3 (L) 02/27/2021   MCV 100.8 (H) 02/27/2021   PLT 105 (L) 02/27/2021   Micro Results: Recent Results (from the past 240 hour(s))  Resp Panel by RT-PCR (Flu A&B, Covid) Nasopharyngeal Swab     Status: None   Collection Time: 02/20/21  2:49 PM   Specimen: Nasopharyngeal Swab; Nasopharyngeal(NP) swabs in vial transport medium  Result Value Ref Range Status   SARS Coronavirus 2 by RT PCR NEGATIVE NEGATIVE Final    Comment: (NOTE) SARS-CoV-2 target nucleic acids are NOT DETECTED.  The SARS-CoV-2 RNA is generally detectable in upper respiratory specimens during the acute phase of infection. The lowest concentration of SARS-CoV-2 viral copies this assay can detect is 138 copies/mL. A negative result does not preclude SARS-Cov-2 infection and should not be used as the sole basis for treatment or other patient management decisions. A negative result may occur with  improper specimen collection/handling, submission of specimen other than nasopharyngeal swab, presence of viral mutation(s) within the areas targeted by this assay, and inadequate number of viral copies(<138 copies/mL). A negative result must be combined with clinical observations, patient history, and epidemiological information. The expected result is Negative.  Fact  Sheet for Patients:  EntrepreneurPulse.com.au  Fact Sheet for Healthcare Providers:  IncredibleEmployment.be  This test is no t yet approved or cleared by the [place] FDA and  has been authorized for detection and/or diagnosis of SARS-CoV-2 by FDA under an Emergency Use Authorization (EUA). This EUA will remain  in effect (meaning this test can be used) for the duration of the COVID-19 declaration under Section 564(b)(1) of the Act, 21 U.S.C.section  360bbb-3(b)(1), unless the authorization is terminated  or revoked sooner.       Influenza A by PCR NEGATIVE NEGATIVE Final   Influenza B by PCR NEGATIVE NEGATIVE Final    Comment: (NOTE) The Xpert Xpress SARS-CoV-2/FLU/RSV plus assay is intended as an aid in the diagnosis of influenza from Nasopharyngeal swab specimens and should not be used as a sole basis for treatment. Nasal washings and aspirates are unacceptable for Xpert Xpress SARS-CoV-2/FLU/RSV testing.  Fact Sheet for Patients: EntrepreneurPulse.com.au  Fact Sheet for Healthcare Providers: IncredibleEmployment.be  This test is not yet approved or cleared by the [place] FDA and has been authorized for detection and/or diagnosis of SARS-CoV-2 by FDA under an Emergency Use Authorization (EUA). This EUA will remain in effect (meaning this test can be used) for the duration of the COVID-19 declaration under Section 564(b)(1) of the Act, 21 U.S.C. section 360bbb-3(b)(1), unless the authorization is terminated or revoked.  Performed at [facility], [address] [place], [place] [address]   Culture, blood (single)     Status: None   Collection Time: 02/20/21  9:05 PM   Specimen: BLOOD  Result Value Ref Range Status   Specimen Description BLOOD BLOOD RIGHT FOREARM  Final   Special Requests   Final    BOTTLES DRAWN AEROBIC AND ANAEROBIC Blood Culture results may not be optimal due to an excessive volume of blood received in culture bottles   Culture   Final    NO GROWTH 5 DAYS Performed at [facility], [address] [place], [place] [address]    Report Status 02/25/2021 FINAL  Final  MRSA Next Gen by PCR, Nasal     Status: None   Collection Time: 02/21/21  7:39 AM   Specimen: Nasal Mucosa; Nasal Swab  Result Value Ref Range Status   MRSA by PCR Next Gen NOT DETECTED NOT DETECTED Final    Comment: (NOTE) The GeneXpert MRSA Assay (FDA approved for NASAL  specimens only), is one component of a comprehensive MRSA colonization surveillance program. It is not intended to diagnose MRSA infection nor to guide or monitor treatment for MRSA infections. Test performance is not FDA approved in patients less than 65 years old. Performed at [facility], [address] [place], [place] [address]   Aerobic Culture w Gram Stain (superficial specimen)     Status: None (Preliminary result)   Collection Time: 02/25/21  5:25 PM   Specimen: Joint, Hip; Wound  Result Value Ref Range Status   Specimen Description   Final    HIP RIGHT HIP WOUND Performed at [facility], [address], [place], [place] [address]    Special Requests   Final    NONE Performed at [facility], [address] [place], [place] [address]    Gram Stain   Final    FEW WBC PRESENT, PREDOMINANTLY MONONUCLEAR NO ORGANISMS SEEN    Culture   Final    CULTURE REINCUBATED FOR BETTER GROWTH Performed at [facility], [place] [address]  [place], [place] [address]    Report Status PENDING  Incomplete  Anaerobic culture w Gram Stain     Status: None (Preliminary result)   Collection Time: 02/26/21 12:54 PM   Specimen: Fluid; Abscess  Result Value Ref Range Status   Specimen Description   Final    FLUID Performed at [facility], [address] [place], [place] [address]    Special Requests   Final    NONE Performed at [facility], [address] [place], [place] [address]    Gram Stain   Final    RARE WBC PRESENT, PREDOMINANTLY MONONUCLEAR NO ORGANISMS SEEN Performed at [facility], [place] [address], [place], [place] [address]    Culture PENDING  Incomplete   Report Status PENDING  Incomplete  Aerobic Culture w Gram Stain (superficial specimen)     Status: None (Preliminary result)   Collection Time: 02/26/21  6:26 PM   Specimen: Wound  Result Value Ref Range Status   Specimen Description   Final     WOUND Performed at [facility], [address], [place], [place] [address]    Special Requests   Final    NONE Performed at [facility], [address] [place], [place] [address]    Gram Stain PENDING  Incomplete   Culture   Final    CULTURE REINCUBATED FOR BETTER GROWTH Performed at [facility], [place] [address] [place], [place] [address]    Report Status PENDING  Incomplete   Studies/Results: CT FEMUR LEFT W CONTRAST  Result Date: 02/26/2021 CLINICAL DATA:  Concern for soft tissue infection. Thigh swelling. Left leg pain. EXAM: CT OF THE LOWER RIGHT EXTREMITY WITH CONTRAST TECHNIQUE: Multidetector CT imaging of the lower right extremity was performed according to the standard protocol following intravenous contrast administration. 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 Beam hardening artifact resulting from the orthopedic hardware partially obscures adjacent soft tissue and osseous structures. No fracture or dislocation. Normal alignment. No joint effusion. 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 Ligaments Ligaments are suboptimally evaluated by CT. Muscles and Tendons Generalized muscle atrophy of the extensor compartment muscles. Severe atrophy of the flexor compartment muscles. No intramuscular fluid collection or hematoma. Soft tissue No fluid collection or hematoma. No soft tissue mass. Soft tissue edema in the subcutaneous fat of the left thigh most severe along the lateral aspect with skin thickening and extending into the pelvis most consistent with cellulitis. No drainable fluid collection to suggest an abscess. IMPRESSION: 1. Soft tissue edema in the subcutaneous fat of the left thigh most severe along the lateral aspect with skin thickening and extending  into the pelvis most consistent with cellulitis. No drainable fluid collection to suggest an abscess. Electronically Signed   By: Kathreen Devoid M.D.   On: 02/26/2021 09:37   [place] IMAGE GUIDED DRAINAGE BY PERCUTANEOUS CATHETER  Result Date: 02/26/2021 INDICATION: Patient with history of right hip arthroplasty and recent complaints of right lateral thigh open wound drainage, recent imaging revealed fluid collection along the lateral aspect of the vastus lateralis muscle concerning for abscess. Request received for diagnostic aspiration. EXAM: RIGHT THIGH FLUID COLLECTION ASPIRATION UNDER ULTRASOUND MEDICATIONS: Local 1% Lidocaine only. ANESTHESIA/SEDATION: Local 1% Lidocaine only. COMPLICATIONS: None immediate. PROCEDURE: Risks and benefits of the procedure were discussed today with the patient/patient's legal  guardian, all questions were answered and informed written consent was obtained from the patient/patient's legal guardian prior to the procedure. Potential complications that were discussed included, but are not limited to: introduction of infection, bleeding, and inability to aspirate fluid. The patient/patient's legal guardian voiced understanding of these risks and agreed to proceed. The usual time-out protocol was performed immediately prior to the procedure. The appropriate skin site for needle placement was chosen with the aid of ultrasound. Using the usual sterile technique with chlorhexidine and lidocaine 1% as local anesthetic, a Yueh catheter was advanced into the right lateral thigh fluid collection under ultrasound guidance. Approximately 9 cc of serosanguineous colored fluid was aspirated and sent to the laboratory for the requested studies. The needle was removed in its entirety and a sterile bandage was applied. The patient tolerated the procedure well without apparent immediate complications and was discharged from the radiology department in good condition. IMPRESSION: Technically successful  ultrasound guided right thigh fluid collection aspiration. This exam was performed by Tsosie Billing PA-C, and was supervised and interpreted by Dr. Pascal Lux. Electronically Signed   By: Sandi Mariscal M.D.   On: 02/26/2021 16:20   Medications:  Scheduled Meds:  sodium chloride   Intravenous Once   atorvastatin  40 mg Oral QHS   epoetin alfa       epoetin (EPOGEN/PROCRIT) injection  6,000 Units Intravenous Q T,Th,Sa-HD   feeding supplement (NEPRO CARB STEADY)  237 mL Oral TID BM   finasteride  5 mg Oral Daily   lidocaine  1 patch Transdermal Q24H   metoprolol tartrate  12.5 mg Oral BID   midodrine  10 mg Oral TID WC   multivitamin  1 tablet Oral QHS   pantoprazole (PROTONIX) IV  40 mg Intravenous Q12H   rOPINIRole  1 mg Oral QHS   sevelamer carbonate  1,600 mg Oral Once per day on Sun Mon Wed Fri   sevelamer carbonate  1,600 mg Oral BID WC   sodium chloride flush  10 mL Intravenous Q12H   vitamin B-12  1,000 mcg Oral Daily   Continuous Infusions:  ceFEPime (MAXIPIME) IV 1 g (02/26/21 2029)   vancomycin 1,000 mg (02/27/21 1105)   PRN Meds:.HYDROcodone-acetaminophen   Assessment: Principal Problem:   Acute metabolic encephalopathy Active Problems:   NSTEMI, initial episode of care ([place])   Longstanding persistent atrial fibrillation ([place]) Melena   Plan: Last bowel movement was at 3 PM yesterday and patient has not had any further bowel movement since then  Cardiology following and as per their note today, patient is having high risk for anesthesia  Eliquis has been held and hemoglobin has been relatively stable since yesterday  Given patient is at high risk for endoscopic procedures at this time, and has not had any further episodes of melena after stopping Eliquis, no indication for emergent EGD  However, if clinical status changes, patient can be evaluated for this again  PPI IV twice daily  Continue serial CBCs and transfuse PRN Avoid NSAIDs Maintain 2 large-bore IV  lines Please page GI with any acute hemodynamic changes, or signs of active GI bleeding    LOS: 6 days   Antonio Antigua, MD 02/27/2021, 1:35 PM

## 2021-02-27 NOTE — Plan of Care (Signed)
  Problem: Education: Goal: Knowledge of General Education information will improve Description: Including pain rating scale, medication(s)/side effects and non-pharmacologic comfort measures Outcome: Progressing   Problem: Pain Managment: Goal: General experience of comfort will improve Outcome: Progressing   Problem: Safety: Goal: Ability to remain free from injury will improve Outcome: Progressing   

## 2021-02-27 NOTE — Progress Notes (Signed)
Progress Note  Patient Name: Antonio Phillips Date of Encounter: 02/27/2021  [facility] Cardiologist: Ida Rogue, MD   Subjective   Pt seen in dialysis. Hgb 8 this AM. Eliquis on hold.   Inpatient Medications    Scheduled Meds:  atorvastatin  40 mg Oral QHS   epoetin (EPOGEN/PROCRIT) injection  6,000 Units Intravenous Q T,Th,Sa-HD   feeding supplement (NEPRO CARB STEADY)  237 mL Oral TID BM   finasteride  5 mg Oral Daily   lidocaine  1 patch Transdermal Q24H   metoprolol tartrate  12.5 mg Oral BID   midodrine  10 mg Oral TID WC   multivitamin  1 tablet Oral QHS   pantoprazole (PROTONIX) IV  40 mg Intravenous Q12H   rOPINIRole  1 mg Oral QHS   sevelamer carbonate  1,600 mg Oral Once per day on Sun Mon Wed Fri   sevelamer carbonate  1,600 mg Oral BID WC   sodium chloride flush  10 mL Intravenous Q12H   vitamin B-12  1,000 mcg Oral Daily   Continuous Infusions:  sodium chloride 50 mL/hr at 02/26/21 2234   ceFEPime (MAXIPIME) IV 1 g (02/26/21 2029)   vancomycin     PRN Meds: HYDROcodone-acetaminophen, HYDROmorphone (DILAUDID) injection   Vital Signs    Vitals:   02/26/21 1921 02/27/21 0031 02/27/21 0322 02/27/21 0431  BP: 106/70 99/64 90/65    Pulse: 77     Resp: 18 20 18    Temp: 98.2 F (36.8 C) 97.8 F (36.6 C) 98.2 F (36.8 C)   TempSrc:  Oral    SpO2: 99% 100% 100%   Weight:  102.8 kg  102.8 kg  Height:        Intake/Output Summary (Last 24 hours) at 02/27/2021 0817 Last data filed at 02/27/2021 0434 Gross per 24 hour  Intake 300 ml  Output 300 ml  Net 0 ml   Last 3 Weights 02/27/2021 02/27/2021 02/26/2021  Weight (lbs) 226 lb 10.1 oz 226 lb 10.1 oz 225 lb 1.4 oz  Weight (kg) 102.8 kg 102.8 kg 102.1 kg      Telemetry    Afib, HR 80-90s- Personally Reviewed  ECG    No new - Personally Reviewed  Physical Exam   GEN: No acute distress.   Neck: No JVD Cardiac: Irreg IRreg, no murmurs, rubs, or gallops.  Respiratory: Clear to  auscultation bilaterally. GI: Soft, nontender, non-distended  MS: No edema; b/l amputee Neuro:  Nonfocal  Psych: Normal affect   Labs    High Sensitivity Troponin:   Recent Labs  Lab 02/02/21 2135 02/05/21 0416 02/05/21 0845 02/20/21 2123 02/20/21 2315  TROPONINIHS 397* 147* 146* 1,557* 2,463*     Chemistry Recent Labs  Lab 02/20/21 1602 02/21/21 0347 02/22/21 0349 02/23/21 0556 02/26/21 0520 02/27/21 0630  NA 138 141 138 137 135 134*  K 3.0* 3.2* 3.3* 4.1 4.7 5.6*  CL 100 104 103 102 99 97*  CO2 26 25 25 22 28 27   GLUCOSE 124* 93 134* 118* 91 90  BUN 24* 27* 44* 57* 35* 50*  CREATININE 3.94* 4.56* 5.51* 6.33* 3.47* 4.24*  CALCIUM 9.4 8.7* 8.3* 8.3* 8.4* 8.6*  MG  --  1.9 1.8 1.8  --   --   PROT 6.8  --   --   --   --  5.5*  ALBUMIN 3.1*  --   --  2.3* 2.3* 2.5*  AST 18  --   --   --   --  15  ALT 9  --   --   --   --  11  ALKPHOS 150*  --   --   --   --  100  BILITOT 2.2*  --   --   --   --  1.3*  GFRNONAA 15* 13* 10* 9* 18* 14*  ANIONGAP 12 12 10 13 8 10     Lipids No results for input(s): CHOL, TRIG, HDL, LABVLDL, LDLCALC, CHOLHDL in the last 168 hours.  Hematology Recent Labs  Lab 02/26/21 0520 02/26/21 1528 02/27/21 0630  WBC 6.6 6.0 5.9  RBC 2.72* 2.61* 2.61*  HGB 8.5* 8.1* 8.0*  HCT 27.3* 26.1* 26.3*  MCV 100.4* 100.0 100.8*  MCH 31.3 31.0 30.7  MCHC 31.1 31.0 30.4  RDW 21.2* 21.2* 21.3*  PLT 87* 91* 105*   Thyroid No results for input(s): TSH, FREET4 in the last 168 hours.  BNP Recent Labs  Lab 02/20/21 2123  BNP >4,500.0*    DDimer No results for input(s): DDIMER in the last 168 hours.   Radiology    CT FEMUR LEFT W CONTRAST  Result Date: 02/26/2021 CLINICAL DATA:  Concern for soft tissue infection. Thigh swelling. Left leg pain. EXAM: CT OF THE LOWER RIGHT EXTREMITY WITH CONTRAST TECHNIQUE: Multidetector CT imaging of the lower right extremity was performed according to the standard protocol following intravenous contrast  administration. CONTRAST:  161mL OMNIPAQUE IOHEXOL 300 MG/ML  SOLN COMPARISON:  CT pelvis 02/24/2021 FINDINGS: Bones/Joint/Cartilage Left total hip arthroplasty without hardware failure or complication. Beam hardening artifact resulting from the orthopedic hardware partially obscures adjacent soft tissue and osseous structures. No fracture or dislocation. Normal alignment. No joint effusion. 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 Ligaments Ligaments are suboptimally evaluated by CT. Muscles and Tendons Generalized muscle atrophy of the extensor compartment muscles. Severe atrophy of the flexor compartment muscles. No intramuscular fluid collection or hematoma. Soft tissue No fluid collection or hematoma. No soft tissue mass. Soft tissue edema in the subcutaneous fat of the left thigh most severe along the lateral aspect with skin thickening and extending into the pelvis most consistent with cellulitis. No drainable fluid collection to suggest an abscess. IMPRESSION: 1. Soft tissue edema in the subcutaneous fat of the left thigh most severe along the lateral aspect with skin thickening and extending into the pelvis most consistent with cellulitis. No drainable fluid collection to suggest an abscess. Electronically Signed   By: Kathreen Devoid M.D.   On: 02/26/2021 09:37   [place] IMAGE GUIDED DRAINAGE BY PERCUTANEOUS CATHETER  Result Date: 02/26/2021 INDICATION: Patient with history of right hip arthroplasty and recent complaints of right lateral thigh open wound drainage, recent imaging revealed fluid collection along the lateral aspect of the vastus lateralis muscle concerning for abscess. Request received for diagnostic aspiration. EXAM: RIGHT THIGH FLUID COLLECTION ASPIRATION UNDER ULTRASOUND MEDICATIONS: Local 1% Lidocaine only. ANESTHESIA/SEDATION: Local 1% Lidocaine only. COMPLICATIONS: None immediate. PROCEDURE: Risks and benefits of the procedure were  discussed today with the patient/patient's legal guardian, all questions were answered and informed written consent was obtained from the patient/patient's legal guardian prior to the procedure. Potential complications that were discussed included, but are not limited to: introduction of infection, bleeding, and inability to aspirate fluid. The patient/patient's legal guardian voiced understanding of these risks and agreed to proceed. The usual time-out protocol was performed immediately prior to the procedure. The appropriate skin site for needle placement was chosen with the aid of  ultrasound. Using the usual sterile technique with chlorhexidine and lidocaine 1% as local anesthetic, a Yueh catheter was advanced into the right lateral thigh fluid collection under ultrasound guidance. Approximately 9 cc of serosanguineous colored fluid was aspirated and sent to the laboratory for the requested studies. The needle was removed in its entirety and a sterile bandage was applied. The patient tolerated the procedure well without apparent immediate complications and was discharged from the radiology department in good condition. IMPRESSION: Technically successful ultrasound guided right thigh fluid collection aspiration. This exam was performed by Tsosie Billing PA-C, and was supervised and interpreted by Dr. Pascal Lux. Electronically Signed   By: Sandi Mariscal M.D.   On: 02/26/2021 16:20    Cardiac Studies   Limited echo 09/2020: 1. Left ventricular ejection fraction, by estimation, is 45 to 50%. The  left ventricle has mildly decreased function. Left ventricular endocardial  border not optimally defined to evaluate regional wall motion. There is  mild left ventricular hypertrophy.   2. Right ventricular systolic function is moderately reduced. The right  ventricular size is normal. Mildly increased right ventricular wall  thickness. There is moderately elevated pulmonary artery systolic  pressure.   3. The mitral  valve is abnormal. Trivial mitral valve regurgitation.   4. The aortic valve is tricuspid. There is mild thickening of the aortic  valve.   5. The inferior vena cava is dilated in size with <50% respiratory  variability, suggesting right atrial pressure of 15 mmHg.  __________   LHC 09/2020:   Ost LAD to Prox LAD lesion is 50% stenosed.   2nd Mrg lesion is 99% stenosed.   A drug-eluting stent was successfully placed using a STENT RESOLUTE ONYX 2.5X15.   Post intervention, there is a 30% residual stenosis.   1.  Severe single-vessel coronary artery disease with 99% subtotal stenosis of the second obtuse marginal branch, treated with a 2.5 x 15 mm resolute Onyx DES.  30% residual stenosis at the case completion due to resistant/calcified lesion type even with high-pressure noncompliant balloon angioplasty. 2.  Mild to moderate nonobstructive proximal LAD stenosis 3.  Mild nonobstructive RCA stenosis  Recommendations: Aggressive medical therapy, as long as no bleeding complications arise, would resume apixaban tomorrow, aspirin 81 mg daily x30 days, and clopidogrel 75 mg daily x minimum 6 months. __________   2D Echocardiogram 12.19.2022    1. Left ventricular ejection fraction, by estimation, is 40 to 45%. The  left ventricle has mildly decreased function. Left ventricular endocardial  border not optimally defined to evaluate regional wall motion. Left  ventricular diastolic parameters are  indeterminate.   2. Right ventricular systolic function is normal. The right ventricular  size is normal. Tricuspid regurgitation signal is inadequate for assessing  PA pressure.   3. Left atrial size was mildly dilated.   4. A small pericardial effusion is present. The pericardial effusion is  posterior to the left ventricle. Moderate pleural effusion in the left  lateral region.   5. The mitral valve is normal in structure. Mild mitral valve  regurgitation. No evidence of mitral stenosis.   6.  The aortic valve is normal in structure. Aortic valve regurgitation is  not visualized. Aortic valve sclerosis/calcification is present, without  any evidence of aortic stenosis.   7. Moderately dilated pulmonary artery.   8. The inferior vena cava is dilated in size with <50% respiratory  variability, suggesting right atrial pressure of 15 mmHg.   Patient Profile     75 y.o.  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 fib on Eliquis, ESRD on HD, hypotension, DM2, anemia of chronic disease, morbid obesity presented with low-grade fever and chills with mental status changes and reported abdominal pain.  He was found to have elevated troponin.    Assessment & Plan    Melena - Noted to have melena 02/25/21 and Eliquis held - Fecal occult positive - started on PPI - GI consulted and wondering about EGD/colonoscopy - Pt is high risk and would prefer conservative management if able, may be done as OP if stable - Hgb 8.5>8.1>8.1. Low threshold for transfusion  CAD/demand ischemia - elevated troponin 2400 in the setting of fever, chills, abdominal pain and AMS - Cath in July 2022 showed severe second obtuse marginal disease and he underwent DES placement - Echo this admission showed LVEF 40-45%, which is slightly down of 45-50% in July - suspect demand ischemia - No plan for ischemic evaluation - plavix previously held for thrombocytopenia - Hold Eliquis for melena  Permanent Afib - Eliquis held for melena - he is in rete controlled afib - requires midodrine for pressure support  ESRD - HD per nephrology  HFmrEF - volume management per HD  For questions or updates, please contact [facility] Please consult [email or web address] for contact info under        Signed, Kammie Scioli Ninfa Meeker, PA-C  02/27/2021, 8:17 AM

## 2021-02-27 NOTE — Progress Notes (Signed)
Chaplain Maggie offered support to pt who was experiencing a moment of anxiety and requesting someone to talk with. Empathetic listening was central to this visit. Pt shared complex life experiences of the last 5 years related to health issues. He has endured many losses and expressed being tired. Pt was teary at times as he noted a lack of support system. Talking gave him a moment of relief as he stated, "I feel a little bit better."

## 2021-02-27 NOTE — Progress Notes (Signed)
PROGRESS NOTE   Antonio Phillips  ZOX:[id number] DOB: [birth year] DOA: 02/20/2021 PCP: Marsh Dolly, MD  Brief Narrative:  75 year old skilled facility dwelling white male ESRD HD TTS with chronic hypotension on midodrine PVD right BKA left AKA DM TY 2 CAD status post DES to obtuse marginal Permanent A. fib CHADS2 score >4 Eliquis  Recent admission 11/27 through 12/6 choledocholithiasis status post ERCP-brief stepdown stay-patient had biliary duct dilatation removal of 1 stone received 7-day course of Zosyn That admission pertinent for acute superimposed on chronic thrombocytopenia felt secondary to acute illness-at that admission Lyrica dosage increased from 75 to 100 mg daily  Readmit to ICU on 02/21/2021 altered mental status Lactic acid 2.4 troponin 1557-->2463 BNP >4500 CT head negative Code sepsis called started broad-spectrum antibiotics-CT chest not suggestive pneumonia-thought to have  UTI Nephrology consulted for HD, cardiology consulted Echo -12/19--- EF 40-45 % down from prior 60-65% in 09/2020  Eventually found to have possible source of infection in the right with.jprog Scab over the lateral aspect and a large 9 cm abscess layer which was sampled on 12/23 Awaiting Gram stain and culture and then will need to discuss suppressive antibiotics until patient can return to the [place] where he had his prior surgeries done and discuss hardware removal etc. He will require outpatient coordination with gastroenterologist Dr. Allen Norris With regards to plastic stent removal still has left >right sided pain  Hospital-Problem based course  Toxic metabolic encephalopathy secondary to possible infection, resolved  Melena --observed on 12/23.  Hgb stable ~8 --GI consulted.  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  Cardiology following  Permanent A. fib CHADS2 score >4 --cont metop --hold Eliquis due to GI bleed  HFrEF t-Echo this admission EF 40-45% (similar to 09/2020) continue low-dose of metoprolol 12.5 twice daily  Chronic respiratory failure?  Present on admission Patient states has been using oxygen for the past month-we will get a desat screen to see if he actually requires  ESRD TTS Continuing Renvela 1.6 twice daily meals, erythropoietin TTS Continuing midodrine 10 tid to maintain blood pressure Rest as per renal  PVD with multiple amputations and hip repairs --hold ASA and plavix due to GI bleed  Thrombocytopenia?  Acquired-previous baseline 100s currently 26s to 89s Seen by Dr. Janese Banks 02/02/2021 Labs indicate DIC based on immature platelet function 12.6, LDH 88 which may be concerning for the same  platelets are slightly improved from the 50-80 range  Gallstone pancreatitis Recent admission choledocholithiasis s/p ERCP stone removal completing 7 days Zosyn Had plastic stenting on ERCP 12/1-plastic stent was placed and it was recommended to repeat ERCP to remove the stent off of anticoagulation- [facility] Gastroenterology Dr. Allen Norris on d/c to ensure not lost to follow-up  DVT prophylaxis: none in the setting of double amputation and GI  bleeding Code Status: Full Family Communication: None present Disposition:  Status is: Inpatient Further work-up of Hip  pathology   Consultants:  Cardiology  Procedures:   Antimicrobials: Cefepime metronidazole 12/17 through 12/19   Subjective: Pt complained of chest pain after dialysis today, resolved with SL nitro.  EKG no acute changes, and trop down from prior.   Objective: Vitals:   02/27/21 1657 02/27/21 1850 02/27/21 1943 02/27/21 2347  BP: 120/75 110/70 119/76 115/82  Pulse: (!) 109 89 97 (!) 109  Resp: 18  18 20   Temp: 97.8 F (36.6 C) 98.1 F (36.7 C) 97.8 F (36.6 C) 98.3 F (36.8 C)  TempSrc:    Oral  SpO2: 100% 100% 100% 100%  Weight:      Height:        Intake/Output Summary (Last 24 hours) at 02/28/2021 0143 Last data filed at 02/27/2021 1912 Gross per 24 hour  Intake 698 ml  Output 893 ml  Net -195 ml   Filed Weights   02/27/21 0431 02/27/21 0845 02/27/21 1206  Weight: 102.8 kg 102 kg 103 kg    Examination:  Constitutional: NAD, AAOx3 HEENT: conjunctivae and lids normal, EOMI CV: No cyanosis.   RESP: normal respiratory effort Extremities: left AKA, right BKA SKIN: warm, dry Neuro: II - XII grossly intact.   Psych: Normal mood and affect.     Data Reviewed: personally reviewed   CBC    Component Value Date/Time   WBC 5.9 02/27/2021 0630   RBC 2.61 (L) 02/27/2021 0630   HGB 8.0 (L) 02/27/2021 0630   HGB 15.2 11/14/2012 2045   HCT 26.3 (L) 02/27/2021 0630   HCT 45.0 11/14/2012 2045   PLT 105 (L) 02/27/2021 0630   PLT 116 (L) 11/14/2012 2045   MCV 100.8 (H) 02/27/2021 0630   MCV 95 11/14/2012 2045   MCH 30.7 02/27/2021 0630   MCHC 30.4 02/27/2021 0630   RDW 21.3 (H) 02/27/2021 0630   RDW 15.2 (H) 11/14/2012 2045   LYMPHSABS 2.2 02/27/2021 0630   MONOABS 0.5 02/27/2021 0630   EOSABS 0.2 02/27/2021 0630   BASOSABS 0.0 02/27/2021 0630   CMP Latest Ref Rng & Units 02/27/2021 02/26/2021 02/23/2021  Glucose 70 - 99 mg/dL 90 91 118(H)  BUN 8 - 23 mg/dL 50(H) 35(H) 57(H)  Creatinine 0.61 - 1.24 mg/dL 4.24(H) 3.47(H) 6.33(H)  Sodium 135 - 145 mmol/L  134(L) 135 137  Potassium 3.5 - 5.1 mmol/L 5.6(H) 4.7 4.1  Chloride 98 - 111 mmol/L 97(L) 99 102  CO2 22 - 32 mmol/L 27 28 22   Calcium 8.9 - 10.3 mg/dL 8.6(L) 8.4(L) 8.3(L)  Total Protein 6.5 - 8.1 g/dL 5.5(L) - -  Total Bilirubin 0.3 - 1.2 mg/dL 1.3(H) - -  Alkaline Phos 38 - 126 U/L 100 - -  AST 15 - 41 U/L 15 - -  ALT 0 - 44 U/L 11 - -     Radiology Studies: CT FEMUR LEFT W CONTRAST  Result Date: 02/26/2021 CLINICAL DATA:  Concern for soft tissue infection. Thigh swelling. Left leg pain. EXAM: CT OF THE LOWER RIGHT EXTREMITY WITH CONTRAST TECHNIQUE: Multidetector CT imaging of the lower right extremity was performed according to the standard protocol following intravenous contrast administration. CONTRAST:  160mL OMNIPAQUE IOHEXOL 300 MG/ML  SOLN COMPARISON:  CT pelvis 02/24/2021 FINDINGS: Bones/Joint/Cartilage Left total hip arthroplasty without hardware failure or complication. Beam hardening artifact resulting from the orthopedic hardware partially obscures adjacent soft tissue and osseous structures. No fracture or dislocation. Normal  alignment. No joint effusion. 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 Ligaments Ligaments are suboptimally evaluated by CT. Muscles and Tendons Generalized muscle atrophy of the extensor compartment muscles. Severe atrophy of the flexor compartment muscles. No intramuscular fluid collection or hematoma. Soft tissue No fluid collection or hematoma. No soft tissue mass. Soft tissue edema in the subcutaneous fat of the left thigh most severe along the lateral aspect with skin thickening and extending into the pelvis most consistent with cellulitis. No drainable fluid collection to suggest an abscess. IMPRESSION: 1. Soft tissue edema in the subcutaneous fat of the left thigh most severe along the lateral aspect with skin thickening and extending into the pelvis most consistent with cellulitis. No drainable fluid  collection to suggest an abscess. Electronically Signed   By: Kathreen Devoid M.D.   On: 02/26/2021 09:37   [place] IMAGE GUIDED DRAINAGE BY PERCUTANEOUS CATHETER  Result Date: 02/26/2021 INDICATION: Patient with history of right hip arthroplasty and recent complaints of right lateral thigh open wound drainage, recent imaging revealed fluid collection along the lateral aspect of the vastus lateralis muscle concerning for abscess. Request received for diagnostic aspiration. EXAM: RIGHT THIGH FLUID COLLECTION ASPIRATION UNDER ULTRASOUND MEDICATIONS: Local 1% Lidocaine only. ANESTHESIA/SEDATION: Local 1% Lidocaine only. COMPLICATIONS: None immediate. PROCEDURE: Risks and benefits of the procedure were discussed today with the patient/patient's legal guardian, all questions were answered and informed written consent was obtained from the patient/patient's legal guardian prior to the procedure. Potential complications that were discussed included, but are not limited to: introduction of infection, bleeding, and inability to aspirate fluid. The patient/patient's legal guardian voiced understanding of these risks and agreed to proceed. The usual time-out protocol was performed immediately prior to the procedure. The appropriate skin site for needle placement was chosen with the aid of ultrasound. Using the usual sterile technique with chlorhexidine and lidocaine 1% as local anesthetic, a Yueh catheter was advanced into the right lateral thigh fluid collection under ultrasound guidance. Approximately 9 cc of serosanguineous colored fluid was aspirated and sent to the laboratory for the requested studies. The needle was removed in its entirety and a sterile bandage was applied. The patient tolerated the procedure well without apparent immediate complications and was discharged from the radiology department in good condition. IMPRESSION: Technically successful ultrasound guided right thigh fluid collection aspiration. This exam  was performed by Tsosie Billing PA-C, and was supervised and interpreted by Dr. Pascal Lux. Electronically Signed   By: Sandi Mariscal M.D.   On: 02/26/2021 16:20     Scheduled Meds:  sodium chloride   Intravenous Once   atorvastatin  40 mg Oral QHS   epoetin (EPOGEN/PROCRIT) injection  6,000 Units Intravenous Q T,Th,Sa-HD   feeding supplement (NEPRO CARB STEADY)  237 mL Oral TID BM   finasteride  5 mg Oral Daily   lidocaine  1 patch Transdermal Q24H   melatonin  5 mg Oral QHS   metoprolol tartrate  12.5 mg Oral BID   midodrine  10 mg Oral TID WC   multivitamin  1 tablet Oral QHS   pantoprazole (PROTONIX) IV  40 mg Intravenous Q12H   rOPINIRole  1 mg Oral QHS   sevelamer carbonate  1,600 mg Oral Once per day on Sun Mon Wed Fri   sevelamer carbonate  1,600 mg Oral BID WC   sodium chloride flush  10 mL Intravenous Q12H   vitamin B-12  1,000 mcg Oral Daily   Continuous Infusions:  ceFEPime (MAXIPIME) IV 1 g (02/27/21 2038)   vancomycin Stopped (02/27/21 1205)     LOS: 7 days    Enzo Bi, MD [facility] To contact the attending provider between 7A-7P or the covering provider during after hours 7P-7A, please log into the web site [email or web address] and access using universal [place] password for that web site. If you do not have the password, please call the hospital operator.  02/28/2021, 1:43 AM

## 2021-02-28 DIAGNOSIS — G9341 Metabolic encephalopathy: Secondary | ICD-10-CM | POA: Diagnosis not present

## 2021-02-28 LAB — BASIC METABOLIC PANEL
Anion gap: 8 (ref 5–15)
BUN: 32 mg/dL — ABNORMAL HIGH (ref 8–23)
CO2: 27 mmol/L (ref 22–32)
Calcium: 8.6 mg/dL — ABNORMAL LOW (ref 8.9–10.3)
Chloride: 97 mmol/L — ABNORMAL LOW (ref 98–111)
Creatinine, Ser: 3.58 mg/dL — ABNORMAL HIGH (ref 0.61–1.24)
GFR, Estimated: 17 mL/min — ABNORMAL LOW (ref >60)
Glucose, Bld: 118 mg/dL — ABNORMAL HIGH (ref 70–99)
Potassium: 5.2 mmol/L — ABNORMAL HIGH (ref 3.5–5.1)
Sodium: 132 mmol/L — ABNORMAL LOW (ref 135–145)

## 2021-02-28 LAB — CBC
HCT: 27.9 % — ABNORMAL LOW (ref 39.0–52.0)
Hemoglobin: 8.7 g/dL — ABNORMAL LOW (ref 13.0–17.0)
MCH: 31.3 pg (ref 26.0–34.0)
MCHC: 31.2 g/dL (ref 30.0–36.0)
MCV: 100.4 fL — ABNORMAL HIGH (ref 80.0–100.0)
Platelets: 113 10*3/uL — ABNORMAL LOW (ref 150–400)
RBC: 2.78 MIL/uL — ABNORMAL LOW (ref 4.22–5.81)
RDW: 21.2 % — ABNORMAL HIGH (ref 11.5–15.5)
WBC: 4.8 10*3/uL (ref 4.0–10.5)
nRBC: 0 % (ref 0.0–0.2)

## 2021-02-28 LAB — AEROBIC CULTURE W GRAM STAIN (SUPERFICIAL SPECIMEN)

## 2021-02-28 LAB — HEPARIN LEVEL (UNFRACTIONATED): Heparin Unfractionated: >1.1 IU/mL — ABNORMAL HIGH (ref 0.30–0.70)

## 2021-02-28 LAB — TYPE AND SCREEN
ABO/RH(D): O POS
Antibody Screen: NEGATIVE
Unit division: 0

## 2021-02-28 LAB — MAGNESIUM: Magnesium: 1.8 mg/dL (ref 1.7–2.4)

## 2021-02-28 LAB — BPAM RBC
Blood Product Expiration Date: 202301192359
ISSUE DATE / TIME: 202212241524
Unit Type and Rh: 5100

## 2021-02-28 LAB — APTT: aPTT: 156 seconds — ABNORMAL HIGH (ref 24–36)

## 2021-02-28 MED ORDER — HEPARIN (PORCINE) 25000 UT/250ML-% IV SOLN
1350.0000 [IU]/h | INTRAVENOUS | Status: DC
  Administered 2021-02-28: 14:00:00 1350 [IU]/h via INTRAVENOUS
  Filled 2021-02-28: qty 250

## 2021-02-28 MED ORDER — HEPARIN BOLUS VIA INFUSION
4800.0000 [IU] | Freq: Once | INTRAVENOUS | Status: AC
  Administered 2021-02-28: 14:00:00 4800 [IU] via INTRAVENOUS
  Filled 2021-02-28: qty 4800

## 2021-02-28 MED ORDER — ALPRAZOLAM 0.25 MG PO TABS
0.2500 mg | ORAL_TABLET | Freq: Once | ORAL | Status: AC
  Administered 2021-02-28: 06:00:00 0.25 mg via ORAL
  Filled 2021-02-28: qty 1

## 2021-02-28 MED ORDER — MELATONIN 5 MG PO TABS
5.0000 mg | ORAL_TABLET | Freq: Every day | ORAL | Status: DC
  Administered 2021-02-28 – 2021-03-02 (×4): 5 mg via ORAL
  Filled 2021-02-28 (×4): qty 1

## 2021-02-28 NOTE — Progress Notes (Signed)
[facility]  ROUNDING NOTE   Subjective:   Hemodialysis treatment yesterday. Tolerated treatment well. UF of 1 liter.  +Hard of hearing.   2 units PRBC transfusion yesterday.   Apixaban being held   Objective:  Vital signs in last 24 hours:  Temp:  [97.7 F (36.5 C)-98.3 F (36.8 C)] 97.7 F (36.5 C) (12/25 0800) Pulse Rate:  [78-135] 78 (12/25 0800) Resp:  [12-20] 18 (12/25 0947) BP: (91-120)/(56-82) 111/61 (12/25 0800) SpO2:  [76 %-100 %] 96 % (12/25 0800) Weight:  [103 kg-103.4 kg] 103.4 kg (12/25 0500)  Weight change: -0.8 kg Filed Weights   02/27/21 0845 02/27/21 1206 02/28/21 0500  Weight: 102 kg 103 kg 103.4 kg    Intake/Output: I/O last 3 completed shifts: In: 8416 [I.V.:300; Blood:448; IV Piggyback:400] Out: 606 [Urine:200; Other:693]   Intake/Output this shift:  No intake/output data recorded.  Physical Exam: General: NAD, laying in bed  Head: Normocephalic, atraumatic. Moist oral mucosal membranes  Eyes: Anicteric  Lungs:  clear  Heart: Irregular  Abdomen:  Soft, obese  Extremities: No peripheral edema.  Right BKA left AKA  Neurologic: Nonfocal, moving all four extremities  Skin: No lesions  Access: Left AVF    Basic Metabolic Panel: Recent Labs  Lab 02/22/21 0349 02/23/21 0556 02/26/21 0520 02/27/21 0630 02/28/21 0615  NA 138 137 135 134* 132*  K 3.3* 4.1 4.7 5.6* 5.2*  CL 103 102 99 97* 97*  CO2 25 22 28 27 27   GLUCOSE 134* 118* 91 90 118*  BUN 44* 57* 35* 50* 32*  CREATININE 5.51* 6.33* 3.47* 4.24* 3.58*  CALCIUM 8.3* 8.3* 8.4* 8.6* 8.6*  MG 1.8 1.8  --   --  1.8  PHOS 4.5 5.6* 3.4  --   --      Liver Function Tests: Recent Labs  Lab 02/23/21 0556 02/26/21 0520 02/27/21 0630  AST  --   --  15  ALT  --   --  11  ALKPHOS  --   --  100  BILITOT  --   --  1.3*  PROT  --   --  5.5*  ALBUMIN 2.3* 2.3* 2.5*    No results for input(s): LIPASE, AMYLASE in the last 168 hours.  No results for input(s): AMMONIA  in the last 168 hours.   CBC: Recent Labs  Lab 02/24/21 0942 02/26/21 0520 02/26/21 1528 02/27/21 0630 02/28/21 0615  WBC 5.6 6.6 6.0 5.9 4.8  NEUTROABS 2.8 3.7 3.5 3.0  --   HGB 9.2* 8.5* 8.1* 8.0* 8.7*  HCT 29.2* 27.3* 26.1* 26.3* 27.9*  MCV 97.7 100.4* 100.0 100.8* 100.4*  PLT 60* 87* 91* 105* 113*     Cardiac Enzymes: No results for input(s): CKTOTAL, CKMB, CKMBINDEX, TROPONINI in the last 168 hours.  BNP: Invalid input(s): POCBNP  CBG: No results for input(s): GLUCAP in the last 168 hours.   Microbiology: Results for orders placed or performed during the hospital encounter of 02/20/21  Resp Panel by RT-PCR (Flu A&B, Covid) Nasopharyngeal Swab     Status: None   Collection Time: 02/20/21  2:49 PM   Specimen: Nasopharyngeal Swab; Nasopharyngeal(NP) swabs in vial transport medium  Result Value Ref Range Status   SARS Coronavirus 2 by RT PCR NEGATIVE NEGATIVE Final    Comment: (NOTE) SARS-CoV-2 target nucleic acids are NOT DETECTED.  The SARS-CoV-2 RNA is generally detectable in upper respiratory specimens during the acute phase of infection. The lowest concentration of SARS-CoV-2 viral copies this assay  can detect is 138 copies/mL. A negative result does not preclude SARS-Cov-2 infection and should not be used as the sole basis for treatment or other patient management decisions. A negative result may occur with  improper specimen collection/handling, submission of specimen other than nasopharyngeal swab, presence of viral mutation(s) within the areas targeted by this assay, and inadequate number of viral copies(<138 copies/mL). A negative result must be combined with clinical observations, patient history, and epidemiological information. The expected result is Negative.  Fact Sheet for Patients:  EntrepreneurPulse.com.au  Fact Sheet for Healthcare Providers:  IncredibleEmployment.be  This test is no t yet approved or  cleared by the [place] FDA and  has been authorized for detection and/or diagnosis of SARS-CoV-2 by FDA under an Emergency Use Authorization (EUA). This EUA will remain  in effect (meaning this test can be used) for the duration of the COVID-19 declaration under Section 564(b)(1) of the Act, 21 U.S.C.section 360bbb-3(b)(1), unless the authorization is terminated  or revoked sooner.       Influenza A by PCR NEGATIVE NEGATIVE Final   Influenza B by PCR NEGATIVE NEGATIVE Final    Comment: (NOTE) The Xpert Xpress SARS-CoV-2/FLU/RSV plus assay is intended as an aid in the diagnosis of influenza from Nasopharyngeal swab specimens and should not be used as a sole basis for treatment. Nasal washings and aspirates are unacceptable for Xpert Xpress SARS-CoV-2/FLU/RSV testing.  Fact Sheet for Patients: EntrepreneurPulse.com.au  Fact Sheet for Healthcare Providers: IncredibleEmployment.be  This test is not yet approved or cleared by the [place] FDA and has been authorized for detection and/or diagnosis of SARS-CoV-2 by FDA under an Emergency Use Authorization (EUA). This EUA will remain in effect (meaning this test can be used) for the duration of the COVID-19 declaration under Section 564(b)(1) of the Act, 21 U.S.C. section 360bbb-3(b)(1), unless the authorization is terminated or revoked.  Performed at [facility], [address] [place], [place] [address]   Culture, blood (single)     Status: None   Collection Time: 02/20/21  9:05 PM   Specimen: BLOOD  Result Value Ref Range Status   Specimen Description BLOOD BLOOD RIGHT FOREARM  Final   Special Requests   Final    BOTTLES DRAWN AEROBIC AND ANAEROBIC Blood Culture results may not be optimal due to an excessive volume of blood received in culture bottles   Culture   Final    NO GROWTH 5 DAYS Performed at [facility], [address], [place], [place] [address]     Report Status 02/25/2021 FINAL  Final  MRSA Next Gen by PCR, Nasal     Status: None   Collection Time: 02/21/21  7:39 AM   Specimen: Nasal Mucosa; Nasal Swab  Result Value Ref Range Status   MRSA by PCR Next Gen NOT DETECTED NOT DETECTED Final    Comment: (NOTE) The GeneXpert MRSA Assay (FDA approved for NASAL specimens only), is one component of a comprehensive MRSA colonization surveillance program. It is not intended to diagnose MRSA infection nor to guide or monitor treatment for MRSA infections. Test performance is not FDA approved in patients less than 86 years old. Performed at [facility], [place], [place] [address]   Aerobic Culture w Gram Stain (superficial specimen)     Status: None (Preliminary result)   Collection Time: 02/25/21  5:25 PM   Specimen: Joint, Hip; Wound  Result Value Ref Range Status   Specimen Description   Final  HIP RIGHT HIP WOUND Performed at [facility], [address] [place], [place] [address]    Special Requests   Final    NONE Performed at [facility], [place], [place] [address]    Gram Stain   Final    FEW WBC PRESENT, PREDOMINANTLY MONONUCLEAR NO ORGANISMS SEEN    Culture   Final    RARE STAPHYLOCOCCUS EPIDERMIDIS SUSCEPTIBILITIES TO FOLLOW Performed at [facility], [place] [address] [place], [place] [address]    Report Status PENDING  Incomplete  Anaerobic culture w Gram Stain     Status: None (Preliminary result)   Collection Time: 02/26/21 12:54 PM   Specimen: Fluid; Abscess  Result Value Ref Range Status   Specimen Description   Final    FLUID Performed at [facility], [address], [place], [place] [address]    Special Requests   Final    NONE Performed at [facility], [address] [place], [place] [address]    Gram Stain   Final    RARE WBC PRESENT, PREDOMINANTLY MONONUCLEAR NO ORGANISMS SEEN Performed at [facility], [place] [address] [place], [place] [address]    Culture   Final    NO ANAEROBES ISOLATED; CULTURE IN PROGRESS FOR 5 DAYS   Report Status PENDING  Incomplete  Aerobic Culture w Gram Stain (superficial specimen)     Status: None (Preliminary result)   Collection Time: 02/26/21  6:26 PM   Specimen: Wound  Result Value Ref Range Status   Specimen Description   Final    WOUND Performed at [facility], [address], [place], [place] [address]    Special Requests   Final    NONE Performed at [facility], [address] [place], [place] [address]    Gram Stain   Final    NO WBC SEEN RARE GRAM POSITIVE COCCI RARE GRAM POSITIVE RODS    Culture   Final    CULTURE REINCUBATED FOR BETTER GROWTH Performed at [facility], [place] [address] [place],  [address]    Report Status PENDING  Incomplete    Coagulation Studies: No results for input(s): LABPROT, INR in the last 72 hours.   Urinalysis: No results for input(s): COLORURINE, LABSPEC, PHURINE, GLUCOSEU, HGBUR, BILIRUBINUR, KETONESUR, PROTEINUR, UROBILINOGEN, NITRITE, LEUKOCYTESUR in the last 72 hours.  Invalid input(s): APPERANCEUR    Imaging: [place] IMAGE GUIDED DRAINAGE BY PERCUTANEOUS CATHETER  Result Date: 02/26/2021 INDICATION: Patient with history of right hip arthroplasty and recent complaints of right lateral thigh open wound drainage, recent imaging revealed fluid collection along the lateral aspect of the vastus lateralis muscle concerning for abscess. Request received for diagnostic aspiration. EXAM: RIGHT THIGH FLUID COLLECTION ASPIRATION UNDER ULTRASOUND MEDICATIONS: Local 1% Lidocaine only. ANESTHESIA/SEDATION: Local 1% Lidocaine only. COMPLICATIONS: None immediate. PROCEDURE: Risks and benefits of the procedure were discussed today with the patient/patient's legal guardian, all questions were answered and informed written consent was obtained from the patient/patient's legal  guardian prior to the procedure. Potential complications that were discussed included, but are not limited to: introduction of infection, bleeding, and inability to aspirate fluid. The patient/patient's legal guardian voiced understanding of these risks and agreed to proceed. The usual time-out protocol was performed immediately prior to the procedure. The appropriate skin site for needle placement was chosen with the aid of ultrasound. Using the usual sterile technique with chlorhexidine and lidocaine 1% as local anesthetic, a Teressa Lower  catheter was advanced into the right lateral thigh fluid collection under ultrasound guidance. Approximately 9 cc of serosanguineous colored fluid was aspirated and sent to the laboratory for the requested studies. The needle was removed in its entirety and a sterile bandage was applied. The patient tolerated the procedure well without apparent immediate complications and was discharged from the radiology department in good condition. IMPRESSION: Technically successful ultrasound guided right thigh fluid collection aspiration. This exam was performed by Tsosie Billing PA-C, and was supervised and interpreted by Dr. Pascal Lux. Electronically Signed   By: Sandi Mariscal M.D.   On: 02/26/2021 16:20     Medications:    ceFEPime (MAXIPIME) IV Stopped (02/27/21 2108)   vancomycin Stopped (02/27/21 1205)     sodium chloride   Intravenous Once   atorvastatin  40 mg Oral QHS   epoetin (EPOGEN/PROCRIT) injection  6,000 Units Intravenous Q T,Th,Sa-HD   feeding supplement (NEPRO CARB STEADY)  237 mL Oral TID BM   finasteride  5 mg Oral Daily   lidocaine  1 patch Transdermal Q24H   melatonin  5 mg Oral QHS   metoprolol tartrate  12.5 mg Oral BID   midodrine  10 mg Oral TID WC   multivitamin  1 tablet Oral QHS   pantoprazole (PROTONIX) IV  40 mg Intravenous Q12H   rOPINIRole  1 mg Oral QHS   sevelamer carbonate  1,600 mg Oral Once per day on Sun Mon Wed Fri   sevelamer carbonate  1,600  mg Oral BID WC   sodium chloride flush  10 mL Intravenous Q12H   vitamin B-12  1,000 mcg Oral Daily      Assessment/ Plan:  [name] is a 75 y.o. white male with end stage renal disease on hemodialysis, peripheral vascular disease, hypotension, diabetes mellitus type II, hyperlipidemia, right BKA and left AKA who is admitted to [facility] on 02/20/2021 for NSTEMI, initial episode of care [facility]) [I21.4] Fever, unspecified fever cause [R50.9] Altered mental status, unspecified altered mental status type [P59.16] Acute metabolic encephalopathy [B84.66] Acute congestive heart failure, unspecified heart failure type ([place]) [I50.9]  [place] left AVF 96kg   End-stage renal disease on HD TTS.  Next dialysis treatment scheduled for Tuesday, 12/27.   2. Anemia of chronic kidney disease: now with GI bleed.  Lab Results  Component Value Date   HGB 8.7 (L) 02/28/2021   Continue EPO with HD treatment.  Status post PRBC transfusions Appreciate GI input.   3. Secondary Hyperparathyroidism: Lab Results  Component Value Date   CALCIUM 8.6 (L) 02/28/2021   PHOS 3.4 02/26/2021  Continue sevelamer with meals.   4.  Hypotension:   Continue midodrine.   5. Cellulitis: afebrile.  Continue cefepime and vancomycin Appreciate Infectious Disease input.     LOS: 7 Swanson Farnell 12/25/202210:20 AM

## 2021-02-28 NOTE — Plan of Care (Signed)
  Problem: Pain Managment: Goal: General experience of comfort will improve Outcome: Progressing   

## 2021-02-28 NOTE — Progress Notes (Signed)
PROGRESS NOTE   Antonio Phillips  PYK:[id number] DOB: 1945-07-19 DOA: 02/20/2021 PCP: Marsh Dolly, MD  Brief Narrative:  75 year old skilled facility dwelling white male ESRD HD TTS with chronic hypotension on midodrine PVD right BKA left AKA DM TY 2 CAD status post DES to obtuse marginal Permanent A. fib CHADS2 score >4 Eliquis  Recent admission 11/27 through 12/6 choledocholithiasis status post ERCP-brief stepdown stay-patient had biliary duct dilatation removal of 1 stone received 7-day course of Zosyn That admission pertinent for acute superimposed on chronic thrombocytopenia felt secondary to acute illness-at that admission Lyrica dosage increased from 75 to 100 mg daily  Readmit to ICU on 02/21/2021 altered mental status Lactic acid 2.4 troponin 1557-->2463 BNP >4500 CT head negative Code sepsis called started broad-spectrum antibiotics-CT chest not suggestive pneumonia-thought to have  UTI Nephrology consulted for HD, cardiology consulted Echo -12/19--- EF 40-45 % down from prior 60-65% in 09/2020  Eventually found to have possible source of infection in the right with.jprog Scab over the lateral aspect and a large 9 cm abscess layer which was sampled on 12/23 Awaiting Gram stain and culture and then will need to discuss suppressive antibiotics until patient can return to the [place] where he had his prior surgeries done and discuss hardware removal etc. He will require outpatient coordination with gastroenterologist Dr. Allen Norris With regards to plastic stent removal still has left >right sided pain  Hospital-Problem based course  Toxic metabolic encephalopathy secondary to possible infection, resolved  Melena --observed on 12/23.  Hgb stable ~8 --GI consulted.  Plan no endoscopy for now given high risk with anesthesia, per cardio --transfuse 1u pRBC on 12/24 for Hgb 8 (given extensive CAD) --monitor Hgb  9 cm X 2 cm X 3 cm abscess of right hip S/p IR drain Cellulitis on left  hip and thigh --Initial diagnosis entertained was UTI--received cefepime 12/17 through 12/19, blood culture NGTD 12/17 --CT 12/21 =R hip abscess as above, CT 12/23 left hip shows mainly soft tissue swelling no abscess --ID consulted --cont vanc and cefepime  CAD status post DES 09/2020  --hold ASA and plavix due to GI bleed --cont statin  demand type II MI   troponin peak 2400.  Cardiology following  Permanent A. fib CHADS2 score >4 --cont metop --hold Eliquis due to GI bleed --start heparin to see if pt can tolerate anticoagulation without further GI bleed  HFrEF t-Echo this admission EF 40-45% (similar to 09/2020) --continue low-dose of metoprolol 12.5 twice daily  Chronic respiratory failure on 3.5L O2 at baseline --Continue supplemental O2 to keep sats >=90%, wean as tolerated  Hypotension --cont midodrine  ESRD TTS --Continuing Renvela 1.6 twice daily  --iHD per nephrology  PVD with multiple amputations and hip repairs --hold ASA and plavix due to GI bleed  Thrombocytopenia?  Acquired-previous baseline 100s currently 36s to 35s Seen by Dr. Janese Banks 02/02/2021 Labs indicate DIC based on immature platelet function 12.6, LDH 88 which may be concerning for the same  platelets are slightly improved from the 50-80 range  Gallstone pancreatitis Recent admission choledocholithiasis s/p ERCP stone removal completing 7 days Zosyn --Had plastic stenting on ERCP 12/1-plastic stent was placed and it was recommended to repeat ERCP to remove the stent off of anticoagulation  DVT prophylaxis: NL:ZJQBHAL gtt Code Status: Full code  Family Communication:  Status is: inpatient Dispo:   The patient is from: SNF Anticipated d/c is to: SNF Anticipated d/c date is: undetermined Patient currently is not medically stable to d/c due to: IV  heparin, IV abx    Consultants:  Cardiology  Procedures:   Antimicrobials: Cefepime metronidazole 12/17 through 12/19   Subjective: Pt reported  chronic dyspnea.     Objective: Vitals:   02/28/21 1030 02/28/21 1145 02/28/21 1618 02/28/21 1950  BP:  96/74 101/78 110/72  Pulse:  90 95 97  Resp: 16 16 18 20   Temp:  98 F (36.7 C) 98.2 F (36.8 C) 97.8 F (36.6 C)  TempSrc:  Axillary Axillary Oral  SpO2:  100% 99% 100%  Weight:      Height:        Intake/Output Summary (Last 24 hours) at 02/28/2021 2104 Last data filed at 02/28/2021 2017 Gross per 24 hour  Intake 500 ml  Output 100 ml  Net 400 ml   Filed Weights   02/27/21 0845 02/27/21 1206 02/28/21 0500  Weight: 102 kg 103 kg 103.4 kg    Examination:  Constitutional: NAD, AAOx3 HEENT: conjunctivae and lids normal, EOMI CV: No cyanosis.   RESP: normal respiratory effort, on 2L Extremities: left AKA, right BKA SKIN: warm, dry Neuro: II - XII grossly intact.   Psych: depressed mood and affect.     Data Reviewed: personally reviewed   CBC    Component Value Date/Time   WBC 4.8 02/28/2021 0615   RBC 2.78 (L) 02/28/2021 0615   HGB 8.7 (L) 02/28/2021 0615   HGB 15.2 11/14/2012 2045   HCT 27.9 (L) 02/28/2021 0615   HCT 45.0 11/14/2012 2045   PLT 113 (L) 02/28/2021 0615   PLT 116 (L) 11/14/2012 2045   MCV 100.4 (H) 02/28/2021 0615   MCV 95 11/14/2012 2045   MCH 31.3 02/28/2021 0615   MCHC 31.2 02/28/2021 0615   RDW 21.2 (H) 02/28/2021 0615   RDW 15.2 (H) 11/14/2012 2045   LYMPHSABS 2.2 02/27/2021 0630   MONOABS 0.5 02/27/2021 0630   EOSABS 0.2 02/27/2021 0630   BASOSABS 0.0 02/27/2021 0630   CMP Latest Ref Rng & Units 02/28/2021 02/27/2021 02/26/2021  Glucose 70 - 99 mg/dL 118(H) 90 91  BUN 8 - 23 mg/dL 32(H) 50(H) 35(H)  Creatinine 0.61 - 1.24 mg/dL 3.58(H) 4.24(H) 3.47(H)  Sodium 135 - 145 mmol/L 132(L) 134(L) 135  Potassium 3.5 - 5.1 mmol/L 5.2(H) 5.6(H) 4.7  Chloride 98 - 111 mmol/L 97(L) 97(L) 99  CO2 22 - 32 mmol/L 27 27 28   Calcium 8.9 - 10.3 mg/dL 8.6(L) 8.6(L) 8.4(L)  Total Protein 6.5 - 8.1 g/dL - 5.5(L) -  Total Bilirubin 0.3 - 1.2  mg/dL - 1.3(H) -  Alkaline Phos 38 - 126 U/L - 100 -  AST 15 - 41 U/L - 15 -  ALT 0 - 44 U/L - 11 -     Radiology Studies: No results found.   Scheduled Meds:  atorvastatin  40 mg Oral QHS   epoetin (EPOGEN/PROCRIT) injection  6,000 Units Intravenous Q T,Th,Sa-HD   feeding supplement (NEPRO CARB STEADY)  237 mL Oral TID BM   finasteride  5 mg Oral Daily   lidocaine  1 patch Transdermal Q24H   melatonin  5 mg Oral QHS   metoprolol tartrate  12.5 mg Oral BID   midodrine  10 mg Oral TID WC   multivitamin  1 tablet Oral QHS   pantoprazole (PROTONIX) IV  40 mg Intravenous Q12H   rOPINIRole  1 mg Oral QHS   sevelamer carbonate  1,600 mg Oral Once per day on Sun Mon Wed Fri   sevelamer carbonate  1,600 mg Oral  BID WC   sodium chloride flush  10 mL Intravenous Q12H   vitamin B-12  1,000 mcg Oral Daily   Continuous Infusions:  ceFEPime (MAXIPIME) IV 1 g (02/28/21 2017)   heparin 1,350 Units/hr (02/28/21 1416)   vancomycin Stopped (02/27/21 1205)     LOS: 7 days    Enzo Bi, MD [facility] To contact the attending provider between 7A-7P or the covering provider during after hours 7P-7A, please log into the web site [email or web address] and access using universal [place] password for that web site. If you do not have the password, please call the hospital operator.  02/28/2021, 9:04 PM

## 2021-02-28 NOTE — Progress Notes (Signed)
ANTICOAGULATION CONSULT NOTE  Pharmacy Consult for heparin Indication: atrial fibrillation  Allergies  Allergen Reactions   Simvastatin Other (See Comments)    Patient Measurements: Height: 5\' 11"  (180.3 cm) Weight: 103.4 kg (227 lb 15.3 oz) IBW/kg (Calculated) : 75.3 Heparin Dosing Weight: 97 kg  Vital Signs: Temp: 98 F (36.7 C) (12/25 1145) Temp Source: Axillary (12/25 1145) BP: 96/74 (12/25 1145) Pulse Rate: 90 (12/25 1145)  Labs: Recent Labs    02/26/21 0520 02/26/21 1528 02/27/21 0630 02/27/21 1459 02/28/21 0615  HGB 8.5* 8.1* 8.0*  --  8.7*  HCT 27.3* 26.1* 26.3*  --  27.9*  PLT 87* 91* 105*  --  113*  CREATININE 3.47*  --  4.24*  --  3.58*  TROPONINIHS  --   --   --  336*  --     Estimated Creatinine Clearance: 21.5 mL/min (A) (by C-G formula based on SCr of 3.58 mg/dL (H)).   Medical History: Past Medical History:  Diagnosis Date   Demand ischemia ([place])    a. 11/2017 elev Trop in setting of AFib/SVT-->Echo [facility]): EF>55%. Mild LVH. Nl RV fxn.   Diabetes ([place])    a. Pt states that he has no hx of diabetes--A1c 5.5 11/2018.   ESRD (end stage renal disease) ([place])    a. On HD since 2019.   Hyperlipidemia    Hypotension    a. On midodrine.   Morbid obesity ([place])    PAD (peripheral artery disease) ([place])    a. s/p L AKA and R BKA.   Permanent atrial fibrillation ([place])    a. Dx 2019. CHA2DS2VASc = 3-4-->Eliquis.    Medications:  Eliquis 5 mg BID, last dose 12/23 am  Assessment: 75 year old male with atrial fibrillation on Eliquis, which has been on hold secondary to thrombocytopenia and anemia. Patient with some melena this admission and Eliquis held. Pharmacy consulted by Cardiology for heparin challenge to assess for further bleeding concerns prior to restarting DOAC therapy.  Goal of Therapy:  Heparin level 0.3-0.7 units/ml aPTT 66-102 seconds Monitor platelets by anticoagulation protocol: Yes   Plan:  --Heparin 4800 unit bolus --Start heparin  drip at 1350 units/hr --Check HL and aPTT at 2200 (plan to follow HL alone if correlating) --Monitor CBC closely   Tawnya Crook, PharmD, BCPS Clinical Pharmacist 02/28/2021 1:32 PM

## 2021-02-28 NOTE — Progress Notes (Signed)
Chaplain Maggie made follow up visit per nurse request. Pt requested someone to talk to. Storytelling and compassionate presence were central to this visit. Pt expressed tears as he shared stories from his life. Grief support may help pt manage complex losses he has experienced in his lifetime.

## 2021-02-28 NOTE — Progress Notes (Signed)
Progress Note  Patient Name: Antonio Phillips Date of Encounter: 02/28/2021  [facility] Cardiologist: Ida Rogue, MD   Subjective   Had chest pain after dialysis yesterday. Resolved with nitro. He cannot tell me much else about it. HsTn was 336 (prior 2463 8 days ago). Received 1 unit PRBC transfusion yesterday, Hgb 8 -> 8.7.   Overall he is very frustrated today. He is tired of being in the hospital and just wants to go home. He feels like he is not getting any better.  Many concerns, feels that breathing is worse, when he does make urine he goes all over himself, feels like he just keeps coming back to the hospital. He told me to call EMS to take him home today. I tried to talk to him further about this but he said he is tired of being in the hospital and not getting better.  I discussed a heparin challenge with him. After I explained it to him, I asked if he would be ok with this. He said, "Do anything you want, put a gun to my head, I don't care anymore." He said he has no family and no one to visit him. He said he cried all night. He did not express any thoughts of hurting himself but did comment that he didn't care if he died.  Inpatient Medications    Scheduled Meds:  atorvastatin  40 mg Oral QHS   epoetin (EPOGEN/PROCRIT) injection  6,000 Units Intravenous Q T,Th,Sa-HD   feeding supplement (NEPRO CARB STEADY)  237 mL Oral TID BM   finasteride  5 mg Oral Daily   lidocaine  1 patch Transdermal Q24H   melatonin  5 mg Oral QHS   metoprolol tartrate  12.5 mg Oral BID   midodrine  10 mg Oral TID WC   multivitamin  1 tablet Oral QHS   pantoprazole (PROTONIX) IV  40 mg Intravenous Q12H   rOPINIRole  1 mg Oral QHS   sevelamer carbonate  1,600 mg Oral Once per day on Sun Mon Wed Fri   sevelamer carbonate  1,600 mg Oral BID WC   sodium chloride flush  10 mL Intravenous Q12H   vitamin B-12  1,000 mcg Oral Daily   Continuous Infusions:  ceFEPime (MAXIPIME) IV Stopped  (02/27/21 2108)   vancomycin Stopped (02/27/21 1205)   PRN Meds: HYDROcodone-acetaminophen, nitroGLYCERIN   Vital Signs    Vitals:   02/28/21 0800 02/28/21 0947 02/28/21 1030 02/28/21 1145  BP: 111/61   96/74  Pulse: 78   90  Resp: 16 18 16 16   Temp: 97.7 F (36.5 C)   98 F (36.7 C)  TempSrc: Axillary   Axillary  SpO2: 96%   100%  Weight:      Height:        Intake/Output Summary (Last 24 hours) at 02/28/2021 1311 Last data filed at 02/28/2021 0400 Gross per 24 hour  Intake 848 ml  Output --  Net 848 ml   Last 3 Weights 02/28/2021 02/27/2021 02/27/2021  Weight (lbs) 227 lb 15.3 oz 227 lb 1.2 oz 224 lb 13.9 oz  Weight (kg) 103.4 kg 103 kg 102 kg      Telemetry    Telemetry leads off - Personally Reviewed  ECG    02/27/21 afib RVR at 125 bpm - Personally Reviewed  Physical Exam   GEN: Well nourished, well developed in no acute distress HEENT: Normal, moist mucous membranes NECK: No JVD CARDIAC: irregularly irregular rhythm, normal S1 and  S2, no rubs or gallops. No murmur. VASCULAR: Radial pulses 2+ bilaterally.  RESPIRATORY:  Distant breath sounds, [place] in place ABDOMEN: Soft, non-tender, non-distended MUSCULOSKELETAL:   s/p L AKA and R BKA. L stump very tender, with warmth, erythema. R stump with drainage, firm edema NEUROLOGIC:  Alert and oriented x 3. No focal neuro deficits noted. PSYCHIATRIC:  Normal affect    Labs    High Sensitivity Troponin:   Recent Labs  Lab 02/05/21 0416 02/05/21 0845 02/20/21 2123 02/20/21 2315 02/27/21 1459  TROPONINIHS 147* 146* 1,557* 2,463* 336*     Chemistry Recent Labs  Lab 02/22/21 0349 02/23/21 0556 02/26/21 0520 02/27/21 0630 02/28/21 0615  NA 138 137 135 134* 132*  K 3.3* 4.1 4.7 5.6* 5.2*  CL 103 102 99 97* 97*  CO2 25 22 28 27 27   GLUCOSE 134* 118* 91 90 118*  BUN 44* 57* 35* 50* 32*  CREATININE 5.51* 6.33* 3.47* 4.24* 3.58*  CALCIUM 8.3* 8.3* 8.4* 8.6* 8.6*  MG 1.8 1.8  --   --  1.8  PROT  --    --   --  5.5*  --   ALBUMIN  --  2.3* 2.3* 2.5*  --   AST  --   --   --  15  --   ALT  --   --   --  11  --   ALKPHOS  --   --   --  100  --   BILITOT  --   --   --  1.3*  --   GFRNONAA 10* 9* 18* 14* 17*  ANIONGAP 10 13 8 10 8     Lipids No results for input(s): CHOL, TRIG, HDL, LABVLDL, LDLCALC, CHOLHDL in the last 168 hours.  Hematology Recent Labs  Lab 02/26/21 1528 02/27/21 0630 02/28/21 0615  WBC 6.0 5.9 4.8  RBC 2.61* 2.61* 2.78*  HGB 8.1* 8.0* 8.7*  HCT 26.1* 26.3* 27.9*  MCV 100.0 100.8* 100.4*  MCH 31.0 30.7 31.3  MCHC 31.0 30.4 31.2  RDW 21.2* 21.3* 21.2*  PLT 91* 105* 113*   Thyroid No results for input(s): TSH, FREET4 in the last 168 hours.  BNPNo results for input(s): BNP, PROBNP in the last 168 hours.  DDimer No results for input(s): DDIMER in the last 168 hours.   Radiology    No results found.  Cardiac Studies   Limited echo 09/2020: 1. Left ventricular ejection fraction, by estimation, is 45 to 50%. The  left ventricle has mildly decreased function. Left ventricular endocardial  border not optimally defined to evaluate regional wall motion. There is  mild left ventricular hypertrophy.   2. Right ventricular systolic function is moderately reduced. The right  ventricular size is normal. Mildly increased right ventricular wall  thickness. There is moderately elevated pulmonary artery systolic  pressure.   3. The mitral valve is abnormal. Trivial mitral valve regurgitation.   4. The aortic valve is tricuspid. There is mild thickening of the aortic  valve.   5. The inferior vena cava is dilated in size with <50% respiratory  variability, suggesting right atrial pressure of 15 mmHg.  __________   LHC 09/2020:   Ost LAD to Prox LAD lesion is 50% stenosed.   2nd Mrg lesion is 99% stenosed.   A drug-eluting stent was successfully placed using a STENT RESOLUTE ONYX 2.5X15.   Post intervention, there is a 30% residual stenosis.   1.  Severe single-vessel  coronary artery disease with 99% subtotal  stenosis of the second obtuse marginal branch, treated with a 2.5 x 15 mm resolute Onyx DES.  30% residual stenosis at the case completion due to resistant/calcified lesion type even with high-pressure noncompliant balloon angioplasty. 2.  Mild to moderate nonobstructive proximal LAD stenosis 3.  Mild nonobstructive RCA stenosis  Recommendations: Aggressive medical therapy, as long as no bleeding complications arise, would resume apixaban tomorrow, aspirin 81 mg daily x30 days, and clopidogrel 75 mg daily x minimum 6 months. __________   2D Echocardiogram 12.19.2022    1. Left ventricular ejection fraction, by estimation, is 40 to 45%. The  left ventricle has mildly decreased function. Left ventricular endocardial  border not optimally defined to evaluate regional wall motion. Left  ventricular diastolic parameters are  indeterminate.   2. Right ventricular systolic function is normal. The right ventricular  size is normal. Tricuspid regurgitation signal is inadequate for assessing  PA pressure.   3. Left atrial size was mildly dilated.   4. A small pericardial effusion is present. The pericardial effusion is  posterior to the left ventricle. Moderate pleural effusion in the left  lateral region.   5. The mitral valve is normal in structure. Mild mitral valve  regurgitation. No evidence of mitral stenosis.   6. The aortic valve is normal in structure. Aortic valve regurgitation is  not visualized. Aortic valve sclerosis/calcification is present, without  any evidence of aortic stenosis.   7. Moderately dilated pulmonary artery.   8. The inferior vena cava is dilated in size with <50% respiratory  variability, suggesting right atrial pressure of 15 mmHg.   Patient Profile     75 y.o. 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 fib on Eliquis, ESRD on HD, hypotension, DM2,  anemia of chronic disease, morbid obesity presented with low-grade fever and chills with mental status changes and reported abdominal pain.  He was found to have elevated troponin. Course complicated by melena, with holding of anticoagulation.  Assessment & Plan    I am very worried about the patient. He seems very down today, passive comments about not caring if he died but no active thoughts. I communicated this to Dr. Billie Ruddy.  He reports that his breathing is worse today, though on exam seems similar.   From a cardiovascular standpoint, we are in a difficult spot. He requires midodrine to support his low blood pressures. He had melena (though he denies any further bleeding) and is now off clopidogrel and apixaban.    He also has cellulitis and drainage in his stumps, being managed by ID.   With CAD and stent in 09/2020, he is already on no antiplatelet agents due to thrombocytopenia and bleeding. He is out of the highest risk window for stent thrombosis, but there is still some residual risk being on no antiplatelets or anticoagulation.   CHA2DS2/VAS Stroke Risk Points=5, so he is also elevated risk of a stroke being off anticoagulation. However, acute bleeding risk with recent melena takes precedence.    I do not anticipate that his risk for anesthesia will decrease in the near future, given his long term comorbidities. Agree that endoscopy may need to be considered if his clinical picture rapidly deteriorates.   I tried to discuss a heparin challenge with him today. He said he understood but I question this.  Will trial IV heparin to see if melena recurs and/or Hgb drops. If so, heparin can be  stopped rapidly. If he tolerates this, may need to trial restarting DOAC.  Overall I am concerned about his frustration and what seems to be depression today. He has been through a great deal recently, and I think he is tired of it. I am just meeting him for the first time this weekend, but I wonder if a  discussion with palliative care might be helpful to him to discuss goals of care and support.   Time Spent with Patient: I have spent a total of 45 with patient reviewing hospital notes, telemetry, EKGs, labs and examining the patient as well as establishing an assessment and plan that was discussed with the patient.  > 50% of time was spent in direct patient care.   For questions or updates, please contact [place] Please consult [email or web address] for contact info under     Signed, Buford Dresser, MD  02/28/2021, 1:11 PM

## 2021-03-01 DIAGNOSIS — G9341 Metabolic encephalopathy: Secondary | ICD-10-CM | POA: Diagnosis not present

## 2021-03-01 LAB — BASIC METABOLIC PANEL
Anion gap: 9 (ref 5–15)
BUN: 44 mg/dL — ABNORMAL HIGH (ref 8–23)
CO2: 26 mmol/L (ref 22–32)
Calcium: 8.6 mg/dL — ABNORMAL LOW (ref 8.9–10.3)
Chloride: 97 mmol/L — ABNORMAL LOW (ref 98–111)
Creatinine, Ser: 4.56 mg/dL — ABNORMAL HIGH (ref 0.61–1.24)
GFR, Estimated: 13 mL/min — ABNORMAL LOW (ref >60)
Glucose, Bld: 109 mg/dL — ABNORMAL HIGH (ref 70–99)
Potassium: 5.4 mmol/L — ABNORMAL HIGH (ref 3.5–5.1)
Sodium: 132 mmol/L — ABNORMAL LOW (ref 135–145)

## 2021-03-01 LAB — CBC
HCT: 27.6 % — ABNORMAL LOW (ref 39.0–52.0)
Hemoglobin: 8.6 g/dL — ABNORMAL LOW (ref 13.0–17.0)
MCH: 31.4 pg (ref 26.0–34.0)
MCHC: 31.2 g/dL (ref 30.0–36.0)
MCV: 100.7 fL — ABNORMAL HIGH (ref 80.0–100.0)
Platelets: 123 10*3/uL — ABNORMAL LOW (ref 150–400)
RBC: 2.74 MIL/uL — ABNORMAL LOW (ref 4.22–5.81)
RDW: 21.1 % — ABNORMAL HIGH (ref 11.5–15.5)
WBC: 4.8 10*3/uL (ref 4.0–10.5)
nRBC: 0 % (ref 0.0–0.2)

## 2021-03-01 LAB — AEROBIC CULTURE W GRAM STAIN (SUPERFICIAL SPECIMEN): Gram Stain: NONE SEEN

## 2021-03-01 LAB — MAGNESIUM: Magnesium: 1.8 mg/dL (ref 1.7–2.4)

## 2021-03-01 LAB — HEPARIN LEVEL (UNFRACTIONATED): Heparin Unfractionated: 1 IU/mL — ABNORMAL HIGH (ref 0.30–0.70)

## 2021-03-01 LAB — APTT: aPTT: 57 seconds — ABNORMAL HIGH (ref 24–36)

## 2021-03-01 MED ORDER — APIXABAN 2.5 MG PO TABS
2.5000 mg | ORAL_TABLET | Freq: Two times a day (BID) | ORAL | Status: DC
  Administered 2021-03-01 – 2021-03-03 (×5): 2.5 mg via ORAL
  Filled 2021-03-01 (×5): qty 1

## 2021-03-01 MED ORDER — ALPRAZOLAM 0.25 MG PO TABS
0.2500 mg | ORAL_TABLET | Freq: Two times a day (BID) | ORAL | Status: DC | PRN
  Administered 2021-03-01 – 2021-03-03 (×3): 0.25 mg via ORAL
  Filled 2021-03-01 (×3): qty 1

## 2021-03-01 MED ORDER — HEPARIN (PORCINE) 25000 UT/250ML-% IV SOLN
1000.0000 [IU]/h | INTRAVENOUS | Status: DC
  Administered 2021-03-01: 02:00:00 1000 [IU]/h via INTRAVENOUS
  Filled 2021-03-01: qty 250

## 2021-03-01 MED ORDER — LORAZEPAM 2 MG/ML IJ SOLN
1.0000 mg | Freq: Once | INTRAMUSCULAR | Status: AC
  Administered 2021-03-01: 16:00:00 1 mg via INTRAVENOUS
  Filled 2021-03-01: qty 1

## 2021-03-01 NOTE — Progress Notes (Signed)
ANTICOAGULATION CONSULT NOTE  Pharmacy Consult for heparin Indication: atrial fibrillation  Allergies  Allergen Reactions   Simvastatin Other (See Comments)    Patient Measurements: Height: 5\' 11"  (180.3 cm) Weight: 103.4 kg (227 lb 15.3 oz) IBW/kg (Calculated) : 75.3 Heparin Dosing Weight: 97 kg  Vital Signs: Temp: 98.2 F (36.8 C) (12/25 2349) Temp Source: Oral (12/25 2349) BP: 111/76 (12/25 2349) Pulse Rate: 97 (12/25 2349)  Labs: Recent Labs    02/26/21 0520 02/26/21 1528 02/27/21 0630 02/27/21 1459 02/28/21 0615 02/28/21 2314  HGB 8.5* 8.1* 8.0*  --  8.7*  --   HCT 27.3* 26.1* 26.3*  --  27.9*  --   PLT 87* 91* 105*  --  113*  --   APTT  --   --   --   --   --  156*  HEPARINUNFRC  --   --   --   --   --  >1.10*  CREATININE 3.47*  --  4.24*  --  3.58*  --   TROPONINIHS  --   --   --  336*  --   --      Estimated Creatinine Clearance: 21.5 mL/min (A) (by C-G formula based on SCr of 3.58 mg/dL (H)).   Medical History: Past Medical History:  Diagnosis Date   Demand ischemia ([place])    a. 11/2017 elev Trop in setting of AFib/SVT-->Echo [facility]): EF>55%. Mild LVH. Nl RV fxn.   Diabetes ([place])    a. Pt states that he has no hx of diabetes--A1c 5.5 11/2018.   ESRD (end stage renal disease) ([place])    a. On HD since 2019.   Hyperlipidemia    Hypotension    a. On midodrine.   Morbid obesity ([place])    PAD (peripheral artery disease) ([place])    a. s/p L AKA and R BKA.   Permanent atrial fibrillation ([place])    a. Dx 2019. CHA2DS2VASc = 3-4-->Eliquis.    Medications:  Eliquis 5 mg BID, last dose 12/23 am  Assessment: 75 year old male with atrial fibrillation on Eliquis, which has been on hold secondary to thrombocytopenia and anemia. Patient with some melena this admission and Eliquis held. Pharmacy consulted by Cardiology for heparin challenge to assess for further bleeding concerns prior to restarting DOAC therapy.  Goal of Therapy:  Heparin level 0.3-0.7  units/ml aPTT 66-102 seconds Monitor platelets by anticoagulation protocol: Yes   Plan:  12/26:  aPTT @ 9480 = 156, elevated  Will hold heparin drip for 1 hr and restart @ 1000 units/hr. Will recheck HL and aPTT 8 hrs after restart.   Orene Desanctis, PharmD Clinical Pharmacist 03/01/2021 12:28 AM

## 2021-03-01 NOTE — Progress Notes (Signed)
°   03/01/21 1320  Clinical Encounter Type  Visited With Patient  Visit Type Initial;Spiritual support;Social support  Referral From Other (Comment) (rounding)  [place] sat with Pt for an extended period. Provided compassionate, non-anxious presence and active listening. Responded initially when overhearing Pt becoming agitated. Pt continues to experience periods of anxious feeling. Toward end of visit Pt expressed feeling he could not breathe well & chaplain left when nursing presence returned to bedside.

## 2021-03-01 NOTE — Progress Notes (Signed)
PROGRESS NOTE   Antonio Phillips  BWI:[id number] DOB: [birth year] DOA: 02/20/2021 PCP: Marsh Dolly, MD  Brief Narrative:  75 year old skilled facility dwelling white male ESRD HD TTS with chronic hypotension on midodrine PVD right BKA left AKA DM TY 2 CAD status post DES to obtuse marginal Permanent A. fib CHADS2 score >4 Eliquis  Recent admission 11/27 through 12/6 choledocholithiasis status post ERCP-brief stepdown stay-patient had biliary duct dilatation removal of 1 stone received 7-day course of Zosyn That admission pertinent for acute superimposed on chronic thrombocytopenia felt secondary to acute illness-at that admission Lyrica dosage increased from 75 to 100 mg daily  Readmit to ICU on 02/21/2021 altered mental status Lactic acid 2.4 troponin 1557-->2463 BNP >4500 CT head negative Code sepsis called started broad-spectrum antibiotics-CT chest not suggestive pneumonia-thought to have  UTI Nephrology consulted for HD, cardiology consulted Echo -12/19--- EF 40-45 % down from prior 60-65% in 09/2020  Eventually found to have possible source of infection in the right with.jprog Scab over the lateral aspect and a large 9 cm abscess layer which was sampled on 12/23 Awaiting Gram stain and culture and then will need to discuss suppressive antibiotics until patient can return to the [place] where he had his prior surgeries done and discuss hardware removal etc. He will require outpatient coordination with gastroenterologist Dr. Allen Norris With regards to plastic stent removal still has left >right sided pain  Hospital-Problem based course  Toxic metabolic encephalopathy secondary to possible infection, resolved  Melena --observed on 12/23.  Hgb stable ~8 --GI consulted.  Plan no endoscopy for now given high risk with anesthesia, per cardio --transfuse 1u pRBC on 12/24 for Hgb 8 (given extensive CAD) --monitor Hgb while resuming anticoagulation  9 cm X 2 cm X 3 cm abscess of right  hip S/p IR drain Cellulitis on left hip and thigh --Initial diagnosis entertained was UTI--received cefepime 12/17 through 12/19, blood culture NGTD 12/17 --CT 12/21 =R hip abscess as above, CT 12/23 left hip shows mainly soft tissue swelling no abscess --ID consulted --cont Vanc and cefepime   CAD status post DES 09/2020  --hold ASA and plavix for now 2/2 GI bleed --cont statin  demand type II MI   troponin peak 2400.  Cardiology following  Permanent A. fib CHADS2 score >4 --cont metop --resume Eliquis at 2.5 mg BID, per cardio, and monitor for GI bleed.  HFrEF  Echo this admission EF 40-45% (similar to 09/2020) --cont metop  Chronic respiratory failure on 3.5L O2 at baseline --Continue supplemental O2 to keep sats >=90%, wean as tolerated  Hypotension --cont midodrine  ESRD TTS --Continuing Renvela 1.6 twice daily  --iHD per nephrology  Hyperkalemia --iHD for removal  PVD with multiple amputations and hip repairs --hold ASA and plavix due to GI bleed  Thrombocytopenia?  Acquired-previous baseline 100s currently 34s to 72s Seen by Dr. Janese Banks 02/02/2021 Labs indicate DIC based on immature platelet function 12.6, LDH 88 which may be concerning for the same  platelets are slightly improved from the 50-80 range   Recent admission for Gallstone pancreatitis and choledocholithiasis s/p ERCP stone removal completing 7 days Zosyn --Had plastic stenting on ERCP 12/1-plastic stent was placed and it was recommended to repeat ERCP to remove the stent off of anticoagulation   DVT prophylaxis: UL:[id number] Code Status: Full code  Family Communication:  Status is: inpatient Dispo:   The patient is from: SNF Anticipated d/c is to: SNF Anticipated d/c date is: undetermined Patient currently is not medically stable to d/c  due to: IV abx, monitor for bleeding while resuming anticoagulation   Consultants:  Cardiology  Procedures:   Antimicrobials: Cefepime metronidazole 12/17  through 12/19   Subjective: Pt was very dissatisfied today, complained about treatment from nursing, and wanted to leave the hospital.    Later, RN noted pt to be anxious asking for Xanax, and then appeared to be having a panic attack.  IV Ativan ordered and pt calmed down and went to sleep.   Objective: Vitals:   03/01/21 1114 03/01/21 1439 03/01/21 1618 03/01/21 1804  BP: 100/67 (!) 125/96 113/76 113/69  Pulse: 92 99 93 90  Resp: 19 19  19   Temp: (!) 97.5 F (36.4 C) (!) 97.5 F (36.4 C)  (!) 97.5 F (36.4 C)  TempSrc: Oral Oral    SpO2: 94% 100% 99% 100%  Weight:      Height:        Intake/Output Summary (Last 24 hours) at 03/01/2021 2059 Last data filed at 03/01/2021 0600 Gross per 24 hour  Intake 41.85 ml  Output 100 ml  Net -58.15 ml   Filed Weights   02/27/21 1206 02/28/21 0500 03/01/21 0500  Weight: 103 kg 103.4 kg 103.2 kg    Examination:  Constitutional: NAD, AAOx3 HEENT: conjunctivae and lids normal, EOMI CV: No cyanosis.   RESP: normal respiratory effort, on 3L Extremities: left AKA, right BKA.   SKIN: warm, dry Neuro: II - XII grossly intact.   Psych: depressed mood and affect.      Data Reviewed: personally reviewed   CBC    Component Value Date/Time   WBC 4.8 03/01/2021 1056   RBC 2.74 (L) 03/01/2021 1056   HGB 8.6 (L) 03/01/2021 1056   HGB 15.2 11/14/2012 2045   HCT 27.6 (L) 03/01/2021 1056   HCT 45.0 11/14/2012 2045   PLT 123 (L) 03/01/2021 1056   PLT 116 (L) 11/14/2012 2045   MCV 100.7 (H) 03/01/2021 1056   MCV 95 11/14/2012 2045   MCH 31.4 03/01/2021 1056   MCHC 31.2 03/01/2021 1056   RDW 21.1 (H) 03/01/2021 1056   RDW 15.2 (H) 11/14/2012 2045   LYMPHSABS 2.2 02/27/2021 0630   MONOABS 0.5 02/27/2021 0630   EOSABS 0.2 02/27/2021 0630   BASOSABS 0.0 02/27/2021 0630   CMP Latest Ref Rng & Units 03/01/2021 02/28/2021 02/27/2021  Glucose 70 - 99 mg/dL 109(H) 118(H) 90  BUN 8 - 23 mg/dL 44(H) 32(H) 50(H)  Creatinine 0.61 - 1.24  mg/dL 4.56(H) 3.58(H) 4.24(H)  Sodium 135 - 145 mmol/L 132(L) 132(L) 134(L)  Potassium 3.5 - 5.1 mmol/L 5.4(H) 5.2(H) 5.6(H)  Chloride 98 - 111 mmol/L 97(L) 97(L) 97(L)  CO2 22 - 32 mmol/L 26 27 27   Calcium 8.9 - 10.3 mg/dL 8.6(L) 8.6(L) 8.6(L)  Total Protein 6.5 - 8.1 g/dL - - 5.5(L)  Total Bilirubin 0.3 - 1.2 mg/dL - - 1.3(H)  Alkaline Phos 38 - 126 U/L - - 100  AST 15 - 41 U/L - - 15  ALT 0 - 44 U/L - - 11     Radiology Studies: No results found.   Scheduled Meds:  apixaban  2.5 mg Oral BID   atorvastatin  40 mg Oral QHS   epoetin (EPOGEN/PROCRIT) injection  6,000 Units Intravenous Q T,Th,Sa-HD   feeding supplement (NEPRO CARB STEADY)  237 mL Oral TID BM   finasteride  5 mg Oral Daily   lidocaine  1 patch Transdermal Q24H   melatonin  5 mg Oral QHS   metoprolol  tartrate  12.5 mg Oral BID   midodrine  10 mg Oral TID WC   multivitamin  1 tablet Oral QHS   pantoprazole (PROTONIX) IV  40 mg Intravenous Q12H   rOPINIRole  1 mg Oral QHS   sevelamer carbonate  1,600 mg Oral Once per day on Sun Mon Wed Fri   sevelamer carbonate  1,600 mg Oral BID WC   sodium chloride flush  10 mL Intravenous Q12H   vitamin B-12  1,000 mcg Oral Daily   Continuous Infusions:  ceFEPime (MAXIPIME) IV 1 g (02/28/21 2017)   vancomycin Stopped (02/27/21 1205)     LOS: 8 days    Enzo Bi, MD [facility] To contact the attending provider between 7A-7P or the covering provider during after hours 7P-7A, please log into the web site [email or web address] and access using universal [place] password for that web site. If you do not have the password, please call the hospital operator.  03/01/2021, 8:59 PM

## 2021-03-01 NOTE — Progress Notes (Signed)
Progress Note  Patient Name: Antonio Phillips Date of Encounter: 03/01/2021  [facility] Cardiologist: Ida Rogue, MD   Subjective   Pt with no complaints this AM. No chest pain. Breathing is the same. AM labs pending.   Inpatient Medications    Scheduled Meds:  atorvastatin  40 mg Oral QHS   epoetin (EPOGEN/PROCRIT) injection  6,000 Units Intravenous Q T,Th,Sa-HD   feeding supplement (NEPRO CARB STEADY)  237 mL Oral TID BM   finasteride  5 mg Oral Daily   lidocaine  1 patch Transdermal Q24H   melatonin  5 mg Oral QHS   metoprolol tartrate  12.5 mg Oral BID   midodrine  10 mg Oral TID WC   multivitamin  1 tablet Oral QHS   pantoprazole (PROTONIX) IV  40 mg Intravenous Q12H   rOPINIRole  1 mg Oral QHS   sevelamer carbonate  1,600 mg Oral Once per day on Sun Mon Wed Fri   sevelamer carbonate  1,600 mg Oral BID WC   sodium chloride flush  10 mL Intravenous Q12H   vitamin B-12  1,000 mcg Oral Daily   Continuous Infusions:  ceFEPime (MAXIPIME) IV 1 g (02/28/21 2017)   heparin 1,000 Units/hr (03/01/21 0135)   vancomycin Stopped (02/27/21 1205)   PRN Meds: HYDROcodone-acetaminophen, nitroGLYCERIN   Vital Signs    Vitals:   02/28/21 2349 03/01/21 0305 03/01/21 0500 03/01/21 0727  BP: 111/76 109/68  102/70  Pulse: 97 96  76  Resp: 20 20  16   Temp: 98.2 F (36.8 C) 97.7 F (36.5 C)  97.6 F (36.4 C)  TempSrc: Oral Axillary  Oral  SpO2: 99% 100%  100%  Weight:   103.2 kg   Height:        Intake/Output Summary (Last 24 hours) at 03/01/2021 1610 Last data filed at 03/01/2021 0600 Gross per 24 hour  Intake 141.85 ml  Output 200 ml  Net -58.15 ml   Last 3 Weights 03/01/2021 02/28/2021 02/27/2021  Weight (lbs) 227 lb 8.2 oz 227 lb 15.3 oz 227 lb 1.2 oz  Weight (kg) 103.2 kg 103.4 kg 103 kg      Telemetry    Afib. HR 90s - Personally Reviewed  ECG    No new - Personally Reviewed  Physical Exam   GEN: No acute distress.   Neck: No JVD Cardiac:  Irreg Irreg, no murmurs, rubs, or gallops.  Respiratory: Clear to auscultation bilaterally. GI: Soft, nontender, non-distended  MS: No edema; No deformity. Neuro:  Nonfocal  Psych: Normal affect   Labs    High Sensitivity Troponin:   Recent Labs  Lab 02/05/21 0416 02/05/21 0845 02/20/21 2123 02/20/21 2315 02/27/21 1459  TROPONINIHS 147* 146* 1,557* 2,463* 336*     Chemistry Recent Labs  Lab 02/23/21 0556 02/26/21 0520 02/27/21 0630 02/28/21 0615  NA 137 135 134* 132*  K 4.1 4.7 5.6* 5.2*  CL 102 99 97* 97*  CO2 22 28 27 27   GLUCOSE 118* 91 90 118*  BUN 57* 35* 50* 32*  CREATININE 6.33* 3.47* 4.24* 3.58*  CALCIUM 8.3* 8.4* 8.6* 8.6*  MG 1.8  --   --  1.8  PROT  --   --  5.5*  --   ALBUMIN 2.3* 2.3* 2.5*  --   AST  --   --  15  --   ALT  --   --  11  --   ALKPHOS  --   --  100  --  BILITOT  --   --  1.3*  --   GFRNONAA 9* 18* 14* 17*  ANIONGAP 13 8 10 8     Lipids No results for input(s): CHOL, TRIG, HDL, LABVLDL, LDLCALC, CHOLHDL in the last 168 hours.  Hematology Recent Labs  Lab 02/26/21 1528 02/27/21 0630 02/28/21 0615  WBC 6.0 5.9 4.8  RBC 2.61* 2.61* 2.78*  HGB 8.1* 8.0* 8.7*  HCT 26.1* 26.3* 27.9*  MCV 100.0 100.8* 100.4*  MCH 31.0 30.7 31.3  MCHC 31.0 30.4 31.2  RDW 21.2* 21.3* 21.2*  PLT 91* 105* 113*   Thyroid No results for input(s): TSH, FREET4 in the last 168 hours.  BNPNo results for input(s): BNP, PROBNP in the last 168 hours.  DDimer No results for input(s): DDIMER in the last 168 hours.   Radiology    No results found.  Cardiac Studies   Limited echo 09/2020: 1. Left ventricular ejection fraction, by estimation, is 45 to 50%. The  left ventricle has mildly decreased function. Left ventricular endocardial  border not optimally defined to evaluate regional wall motion. There is  mild left ventricular hypertrophy.   2. Right ventricular systolic function is moderately reduced. The right  ventricular size is normal. Mildly  increased right ventricular wall  thickness. There is moderately elevated pulmonary artery systolic  pressure.   3. The mitral valve is abnormal. Trivial mitral valve regurgitation.   4. The aortic valve is tricuspid. There is mild thickening of the aortic  valve.   5. The inferior vena cava is dilated in size with <50% respiratory  variability, suggesting right atrial pressure of 15 mmHg.  __________   LHC 09/2020:   Ost LAD to Prox LAD lesion is 50% stenosed.   2nd Mrg lesion is 99% stenosed.   A drug-eluting stent was successfully placed using a STENT RESOLUTE ONYX 2.5X15.   Post intervention, there is a 30% residual stenosis.   1.  Severe single-vessel coronary artery disease with 99% subtotal stenosis of the second obtuse marginal branch, treated with a 2.5 x 15 mm resolute Onyx DES.  30% residual stenosis at the case completion due to resistant/calcified lesion type even with high-pressure noncompliant balloon angioplasty. 2.  Mild to moderate nonobstructive proximal LAD stenosis 3.  Mild nonobstructive RCA stenosis  Recommendations: Aggressive medical therapy, as long as no bleeding complications arise, would resume apixaban tomorrow, aspirin 81 mg daily x30 days, and clopidogrel 75 mg daily x minimum 6 months. __________   2D Echocardiogram 12.19.2022    1. Left ventricular ejection fraction, by estimation, is 40 to 45%. The  left ventricle has mildly decreased function. Left ventricular endocardial  border not optimally defined to evaluate regional wall motion. Left  ventricular diastolic parameters are  indeterminate.   2. Right ventricular systolic function is normal. The right ventricular  size is normal. Tricuspid regurgitation signal is inadequate for assessing  PA pressure.   3. Left atrial size was mildly dilated.   4. A small pericardial effusion is present. The pericardial effusion is  posterior to the left ventricle. Moderate pleural effusion in the left  lateral  region.   5. The mitral valve is normal in structure. Mild mitral valve  regurgitation. No evidence of mitral stenosis.   6. The aortic valve is normal in structure. Aortic valve regurgitation is  not visualized. Aortic valve sclerosis/calcification is present, without  any evidence of aortic stenosis.   7. Moderately dilated pulmonary artery.   8. The inferior vena cava is dilated  in size with <50% respiratory  variability, suggesting right atrial pressure of 15 mmHg.     Patient Profile     75 y.o. 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 fib on Eliquis, ESRD on HD, hypotension, DM2, anemia of chronic disease, morbid obesity presented with low-grade fever and chills with mental status changes and reported abdominal pain.  He was found to have elevated troponin. Course complicated by melena, with holding of anticoagulation.  Assessment & Plan   Melena - Noted to have melena 02/25/21 and Eliquis held - Fecal occult positive - started on PPI - Hgb down to 8.1 s/p 1 unit PRBCs - GI consulted and wondering about EGD/colonoscopy. Pt  high risk and would prefer conservative management if able, may be done as OP if stable - H/o Afib and CAD. IV heparin trial started 12/25 - Hgb pending. If Hgb worsens may need EGD/colonoscopy eval   CAD/demand ischemia - elevated troponin 2400 in the setting of fever, chills, abdominal pain and AMS - Cath in July 2022 showed severe second obtuse marginal disease and he underwent DES placement - Echo this admission showed LVEF 40-45%, which is slightly down of 45-50% in July - plavix previously held for thrombocytopenia - CP episode 12/25 however HS trop continued to show down trend. - suspect demand ischemia - No plan for ischemic evaluation   Permanent Afib - Eliquis held for melena - he is in rate controlled afib - requires midodrine for pressure support - IV heparin trial as  above. IF hgb remains stable, can possibly restart Eliquis. MD to see   ESRD - HD per nephrology   HFmrEF - volume management per HD  For questions or updates, please contact [place] Please consult [email or web address] for contact info under        Signed, Celita Aron Ninfa Meeker, PA-C  03/01/2021, 9:22 AM

## 2021-03-01 NOTE — Progress Notes (Signed)
[facility]  ROUNDING NOTE   Subjective:   Patient states he is not comfortable. He is having pain from an IV infiltration of his right forearm. He is having left stump pain.   Denies any more blood stools. Denies any chest pains.    Objective:  Vital signs in last 24 hours:  Temp:  [97.6 F (36.4 C)-98.2 F (36.8 C)] 97.6 F (36.4 C) (12/26 0727) Pulse Rate:  [76-97] 76 (12/26 0727) Resp:  [16-20] 16 (12/26 0727) BP: (96-111)/(68-78) 102/70 (12/26 0727) SpO2:  [99 %-100 %] 100 % (12/26 0727) Weight:  [103.2 kg] 103.2 kg (12/26 0500)  Weight change: 1.2 kg Filed Weights   02/27/21 1206 02/28/21 0500 03/01/21 0500  Weight: 103 kg 103.4 kg 103.2 kg    Intake/Output: I/O last 3 completed shifts: In: 989.9 [I.V.:41.9; Blood:448; IV [id number]:124] Out: 200 [Urine:200]   Intake/Output this shift:  No intake/output data recorded.  Physical Exam: General: NAD, laying in bed  Head: Normocephalic, atraumatic. Moist oral mucosal membranes  Eyes: Anicteric  Lungs:  clear  Heart: Irregular  Abdomen:  Soft, obese  Extremities: +dependent edema.  Right BKA left AKA - clean dressings  Neurologic: Nonfocal, moving all four extremities  Skin: No lesions  Access: Left AVF    Basic Metabolic Panel: Recent Labs  Lab 02/23/21 0556 02/26/21 0520 02/27/21 0630 02/28/21 0615  NA 137 135 134* 132*  K 4.1 4.7 5.6* 5.2*  CL 102 99 97* 97*  CO2 22 28 27 27   GLUCOSE 118* 91 90 118*  BUN 57* 35* 50* 32*  CREATININE 6.33* 3.47* 4.24* 3.58*  CALCIUM 8.3* 8.4* 8.6* 8.6*  MG 1.8  --   --  1.8  PHOS 5.6* 3.4  --   --      Liver Function Tests: Recent Labs  Lab 02/23/21 0556 02/26/21 0520 02/27/21 0630  AST  --   --  15  ALT  --   --  11  ALKPHOS  --   --  100  BILITOT  --   --  1.3*  PROT  --   --  5.5*  ALBUMIN 2.3* 2.3* 2.5*    No results for input(s): LIPASE, AMYLASE in the last 168 hours.  No results for input(s): AMMONIA in the last 168  hours.   CBC: Recent Labs  Lab 02/24/21 0942 02/26/21 0520 02/26/21 1528 02/27/21 0630 02/28/21 0615  WBC 5.6 6.6 6.0 5.9 4.8  NEUTROABS 2.8 3.7 3.5 3.0  --   HGB 9.2* 8.5* 8.1* 8.0* 8.7*  HCT 29.2* 27.3* 26.1* 26.3* 27.9*  MCV 97.7 100.4* 100.0 100.8* 100.4*  PLT 60* 87* 91* 105* 113*     Cardiac Enzymes: No results for input(s): CKTOTAL, CKMB, CKMBINDEX, TROPONINI in the last 168 hours.  BNP: Invalid input(s): POCBNP  CBG: No results for input(s): GLUCAP in the last 168 hours.   Microbiology: Results for orders placed or performed during the hospital encounter of 02/20/21  Resp Panel by RT-PCR (Flu A&B, Covid) Nasopharyngeal Swab     Status: None   Collection Time: 02/20/21  2:49 PM   Specimen: Nasopharyngeal Swab; Nasopharyngeal(NP) swabs in vial transport medium  Result Value Ref Range Status   SARS Coronavirus 2 by RT PCR NEGATIVE NEGATIVE Final    Comment: (NOTE) SARS-CoV-2 target nucleic acids are NOT DETECTED.  The SARS-CoV-2 RNA is generally detectable in upper respiratory specimens during the acute phase of infection. The lowest concentration of SARS-CoV-2 viral copies this assay can detect  is 138 copies/mL. A negative result does not preclude SARS-Cov-2 infection and should not be used as the sole basis for treatment or other patient management decisions. A negative result may occur with  improper specimen collection/handling, submission of specimen other than nasopharyngeal swab, presence of viral mutation(s) within the areas targeted by this assay, and inadequate number of viral copies(<138 copies/mL). A negative result must be combined with clinical observations, patient history, and epidemiological information. The expected result is Negative.  Fact Sheet for Patients:  EntrepreneurPulse.com.au  Fact Sheet for Healthcare Providers:  IncredibleEmployment.be  This test is no t yet approved or cleared by the  [place] FDA and  has been authorized for detection and/or diagnosis of SARS-CoV-2 by FDA under an Emergency Use Authorization (EUA). This EUA will remain  in effect (meaning this test can be used) for the duration of the COVID-19 declaration under Section 564(b)(1) of the Act, 21 U.S.C.section 360bbb-3(b)(1), unless the authorization is terminated  or revoked sooner.       Influenza A by PCR NEGATIVE NEGATIVE Final   Influenza B by PCR NEGATIVE NEGATIVE Final    Comment: (NOTE) The Xpert Xpress SARS-CoV-2/FLU/RSV plus assay is intended as an aid in the diagnosis of influenza from Nasopharyngeal swab specimens and should not be used as a sole basis for treatment. Nasal washings and aspirates are unacceptable for Xpert Xpress SARS-CoV-2/FLU/RSV testing.  Fact Sheet for Patients: EntrepreneurPulse.com.au  Fact Sheet for Healthcare Providers: IncredibleEmployment.be  This test is not yet approved or cleared by the [place] FDA and has been authorized for detection and/or diagnosis of SARS-CoV-2 by FDA under an Emergency Use Authorization (EUA). This EUA will remain in effect (meaning this test can be used) for the duration of the COVID-19 declaration under Section 564(b)(1) of the Act, 21 U.S.C. section 360bbb-3(b)(1), unless the authorization is terminated or revoked.  Performed at [facility], [address] [place], [place] [address]   Culture, blood (single)     Status: None   Collection Time: 02/20/21  9:05 PM   Specimen: BLOOD  Result Value Ref Range Status   Specimen Description BLOOD BLOOD RIGHT FOREARM  Final   Special Requests   Final    BOTTLES DRAWN AEROBIC AND ANAEROBIC Blood Culture results may not be optimal due to an excessive volume of blood received in culture bottles   Culture   Final    NO GROWTH 5 DAYS Performed at [facility], [address] [place], [place] [address]    Report  Status 02/25/2021 FINAL  Final  MRSA Next Gen by PCR, Nasal     Status: None   Collection Time: 02/21/21  7:39 AM   Specimen: Nasal Mucosa; Nasal Swab  Result Value Ref Range Status   MRSA by PCR Next Gen NOT DETECTED NOT DETECTED Final    Comment: (NOTE) The GeneXpert MRSA Assay (FDA approved for NASAL specimens only), is one component of a comprehensive MRSA colonization surveillance program. It is not intended to diagnose MRSA infection nor to guide or monitor treatment for MRSA infections. Test performance is not FDA approved in patients less than 51 years old. Performed at [facility], [place], [place] [address]   Aerobic Culture w Gram Stain (superficial specimen)     Status: None   Collection Time: 02/25/21  5:25 PM   Specimen: Joint, Hip; Wound  Result Value Ref Range Status   Specimen Description   Final    HIP RIGHT HIP  WOUND Performed at [facility], [address], [place], [place] [address]    Special Requests   Final    NONE Performed at [facility], [place], [place] [address]    Gram Stain   Final    FEW WBC PRESENT, PREDOMINANTLY MONONUCLEAR NO ORGANISMS SEEN Performed at [facility], [place] [address], [place], [place] [address]    Culture RARE STAPHYLOCOCCUS EPIDERMIDIS  Final   Report Status 02/28/2021 FINAL  Final   Organism ID, Bacteria STAPHYLOCOCCUS EPIDERMIDIS  Final      Susceptibility   Staphylococcus epidermidis - MIC*    CIPROFLOXACIN >=8 RESISTANT Resistant     ERYTHROMYCIN >=8 RESISTANT Resistant     GENTAMICIN 8 INTERMEDIATE Intermediate     OXACILLIN >=4 RESISTANT Resistant     TETRACYCLINE >=16 RESISTANT Resistant     VANCOMYCIN 1 SENSITIVE Sensitive     TRIMETH/SULFA 80 RESISTANT Resistant     CLINDAMYCIN >=8 RESISTANT Resistant     RIFAMPIN <=0.5 SENSITIVE Sensitive     Inducible Clindamycin NEGATIVE Sensitive     * RARE STAPHYLOCOCCUS EPIDERMIDIS  Anaerobic culture  w Gram Stain     Status: None (Preliminary result)   Collection Time: 02/26/21 12:54 PM   Specimen: Fluid; Abscess  Result Value Ref Range Status   Specimen Description   Final    FLUID Performed at [facility], [address], [place], [place] [address]    Special Requests   Final    NONE Performed at [facility], [address] [place], [place] [address]    Gram Stain   Final    RARE WBC PRESENT, PREDOMINANTLY MONONUCLEAR NO ORGANISMS SEEN Performed at [facility], [place] [address], [place], [place] [address]    Culture   Final    NO ANAEROBES ISOLATED; CULTURE IN PROGRESS FOR 5 DAYS   Report Status PENDING  Incomplete  Aerobic Culture w Gram Stain (superficial specimen)     Status: None   Collection Time: 02/26/21  6:26 PM   Specimen: Wound  Result Value Ref Range Status   Specimen Description   Final    WOUND Performed at [facility], [address] [place], [place] [address]    Special Requests   Final    NONE Performed at [facility], [address], [place], [place] [address]    Gram Stain   Final    NO WBC SEEN RARE GRAM POSITIVE COCCI RARE GRAM POSITIVE RODS Performed at [facility], [place] [address] [place],  [address]    Culture   Final    RARE STAPHYLOCOCCUS WARNERI RARE ENTEROCOCCUS FAECIUM VANCOMYCIN RESISTANT ENTEROCOCCUS ISOLATED    Report Status 03/01/2021 FINAL  Final   Organism ID, Bacteria STAPHYLOCOCCUS WARNERI  Final   Organism ID, Bacteria ENTEROCOCCUS FAECIUM  Final      Susceptibility   Enterococcus faecium - MIC*    AMPICILLIN >=32 RESISTANT Resistant     VANCOMYCIN >=32 RESISTANT Resistant     GENTAMICIN SYNERGY SENSITIVE Sensitive     LINEZOLID 2 SENSITIVE Sensitive     * RARE ENTEROCOCCUS FAECIUM   Staphylococcus warneri - MIC*    CIPROFLOXACIN 4 RESISTANT Resistant     ERYTHROMYCIN >=8 RESISTANT Resistant     GENTAMICIN <=0.5 SENSITIVE Sensitive     OXACILLIN >=4  RESISTANT Resistant     TETRACYCLINE >=16 RESISTANT Resistant     VANCOMYCIN 1 SENSITIVE Sensitive  TRIMETH/SULFA <=10 SENSITIVE Sensitive     CLINDAMYCIN RESISTANT Resistant     RIFAMPIN <=0.5 SENSITIVE Sensitive     Inducible Clindamycin POSITIVE Resistant     * RARE STAPHYLOCOCCUS WARNERI    Coagulation Studies: No results for input(s): LABPROT, INR in the last 72 hours.   Urinalysis: No results for input(s): COLORURINE, LABSPEC, PHURINE, GLUCOSEU, HGBUR, BILIRUBINUR, KETONESUR, PROTEINUR, UROBILINOGEN, NITRITE, LEUKOCYTESUR in the last 72 hours.  Invalid input(s): APPERANCEUR    Imaging: No results found.   Medications:    ceFEPime (MAXIPIME) IV 1 g (02/28/21 2017)   heparin 1,000 Units/hr (03/01/21 0135)   vancomycin Stopped (02/27/21 1205)     atorvastatin  40 mg Oral QHS   epoetin (EPOGEN/PROCRIT) injection  6,000 Units Intravenous Q T,Th,Sa-HD   feeding supplement (NEPRO CARB STEADY)  237 mL Oral TID BM   finasteride  5 mg Oral Daily   lidocaine  1 patch Transdermal Q24H   melatonin  5 mg Oral QHS   metoprolol tartrate  12.5 mg Oral BID   midodrine  10 mg Oral TID WC   multivitamin  1 tablet Oral QHS   pantoprazole (PROTONIX) IV  40 mg Intravenous Q12H   rOPINIRole  1 mg Oral QHS   sevelamer carbonate  1,600 mg Oral Once per day on Sun Mon Wed Fri   sevelamer carbonate  1,600 mg Oral BID WC   sodium chloride flush  10 mL Intravenous Q12H   vitamin B-12  1,000 mcg Oral Daily      Assessment/ Plan:  [name] is a 75 y.o. white male with end stage renal disease on hemodialysis, peripheral vascular disease, hypotension, diabetes mellitus type II, hyperlipidemia, right BKA and left AKA who is admitted to [facility] on 02/20/2021 for NSTEMI, initial episode of care [facility]) [I21.4] Fever, unspecified fever cause [R50.9] Altered mental status, unspecified altered mental status type [D74.12] Acute metabolic encephalopathy [I78.67] Acute congestive heart  failure, unspecified heart failure type ([place]) [I50.9]  [place] left AVF 96kg  End-stage renal disease on HD TTS.  Next dialysis treatment scheduled for Tuesday, 12/27.   2. Anemia of chronic kidney disease: now with GI bleed. With thrombocytopenia - improving to 113k today Lab Results  Component Value Date   HGB 8.7 (L) 02/28/2021   Continue EPO with HD treatment.  Status post PRBC transfusions Appreciate GI input. Holding apixaban  3. Secondary Hyperparathyroidism: Lab Results  Component Value Date   CALCIUM 8.6 (L) 02/28/2021   PHOS 3.4 02/26/2021  Continue sevelamer with meals.   4.  Hypotension:   Continue midodrine.   5. Cellulitis: afebrile. No leukocytosis.  Continue cefepime and vancomycin Appreciate Infectious Disease input.     LOS: 8 Miki Blank 12/26/202210:53 AM

## 2021-03-01 NOTE — Progress Notes (Addendum)
Contacted to provide presence to patient. Chaplain assigned to unit will follow up.

## 2021-03-01 NOTE — Progress Notes (Signed)
Paged Dr. Sidney Ace, - Pt is complaining of pain in his rt arm and he wants an MD to look at it. I removed an IV that would not flush around 2015, and inserted a new one. His arm bled and I had to put 3 different pressure dressings on it. He is on a heparin gtt that is infusing in the same arm. He has a fistula in the lf arm. Now he is complaining that it feels like a needle is in his arm. There are two IV's in his right arm. When I removed the IV the catheter was intact. I told him you are working remotely and no one can come bedside until morning. I'm informing you just in case you think he needs an X-Ray.  Dr. Sidney Ace replied to give Tylenol - pt had Norco ordered and I gave it at 0314- and apply a warm compress. If the pain continues to order an x-ray of the rt arm.   Pt is has been sleeping since 0400. The warm compress is on his rt arm.

## 2021-03-02 DIAGNOSIS — I214 Non-ST elevation (NSTEMI) myocardial infarction: Secondary | ICD-10-CM | POA: Diagnosis not present

## 2021-03-02 DIAGNOSIS — G9341 Metabolic encephalopathy: Secondary | ICD-10-CM | POA: Diagnosis not present

## 2021-03-02 DIAGNOSIS — D62 Acute posthemorrhagic anemia: Secondary | ICD-10-CM | POA: Diagnosis not present

## 2021-03-02 DIAGNOSIS — K921 Melena: Secondary | ICD-10-CM | POA: Diagnosis not present

## 2021-03-02 LAB — BASIC METABOLIC PANEL
Anion gap: 13 (ref 5–15)
BUN: 50 mg/dL — ABNORMAL HIGH (ref 8–23)
CO2: 24 mmol/L (ref 22–32)
Calcium: 9.2 mg/dL (ref 8.9–10.3)
Chloride: 95 mmol/L — ABNORMAL LOW (ref 98–111)
Creatinine, Ser: 4.86 mg/dL — ABNORMAL HIGH (ref 0.61–1.24)
GFR, Estimated: 12 mL/min — ABNORMAL LOW (ref >60)
Glucose, Bld: 83 mg/dL (ref 70–99)
Potassium: 6.1 mmol/L — ABNORMAL HIGH (ref 3.5–5.1)
Sodium: 132 mmol/L — ABNORMAL LOW (ref 135–145)

## 2021-03-02 LAB — HEPATITIS B SURFACE ANTIGEN: Hepatitis B Surface Ag: NONREACTIVE

## 2021-03-02 LAB — CBC
HCT: 29.3 % — ABNORMAL LOW (ref 39.0–52.0)
Hemoglobin: 9.2 g/dL — ABNORMAL LOW (ref 13.0–17.0)
MCH: 31.6 pg (ref 26.0–34.0)
MCHC: 31.4 g/dL (ref 30.0–36.0)
MCV: 100.7 fL — ABNORMAL HIGH (ref 80.0–100.0)
Platelets: 127 10*3/uL — ABNORMAL LOW (ref 150–400)
RBC: 2.91 MIL/uL — ABNORMAL LOW (ref 4.22–5.81)
RDW: 21 % — ABNORMAL HIGH (ref 11.5–15.5)
WBC: 6 10*3/uL (ref 4.0–10.5)
nRBC: 0 % (ref 0.0–0.2)

## 2021-03-02 LAB — VANCOMYCIN, RANDOM: Vancomycin Rm: 35

## 2021-03-02 LAB — MAGNESIUM: Magnesium: 1.9 mg/dL (ref 1.7–2.4)

## 2021-03-02 LAB — HEPATITIS B CORE ANTIBODY, IGM: Hep B C IgM: NONREACTIVE

## 2021-03-02 MED ORDER — VANCOMYCIN VARIABLE DOSE PER UNSTABLE RENAL FUNCTION (PHARMACIST DOSING): Status: DC

## 2021-03-02 MED ORDER — SODIUM CHLORIDE 0.9 % IV SOLN
8.0000 mg/kg | INTRAVENOUS | Status: DC
  Administered 2021-03-03: 09:00:00 850 mg via INTRAVENOUS
  Filled 2021-03-02: qty 17

## 2021-03-02 NOTE — Progress Notes (Signed)
Progress Note  Patient Name: Antonio Phillips Date of Encounter: 03/02/2021  Primary Cardiologist: Rockey Situ  Subjective   No chest pain, palpitations, or worsening dyspnea with stable chronic dyspnea.  Hemoglobin stable on low-dose apixaban.  He does report having a BM this morning and was told there was no hematochezia or melena associated with this.  Potassium 6.1 this morning, he is due for HD today.  Inpatient Medications    Scheduled Meds:  apixaban  2.5 mg Oral BID   atorvastatin  40 mg Oral QHS   epoetin (EPOGEN/PROCRIT) injection  6,000 Units Intravenous Q T,Th,Sa-HD   feeding supplement (NEPRO CARB STEADY)  237 mL Oral TID BM   finasteride  5 mg Oral Daily   lidocaine  1 patch Transdermal Q24H   melatonin  5 mg Oral QHS   metoprolol tartrate  12.5 mg Oral BID   midodrine  10 mg Oral TID WC   multivitamin  1 tablet Oral QHS   pantoprazole (PROTONIX) IV  40 mg Intravenous Q12H   rOPINIRole  1 mg Oral QHS   sevelamer carbonate  1,600 mg Oral Once per day on Sun Mon Wed Fri   sevelamer carbonate  1,600 mg Oral BID WC   sodium chloride flush  10 mL Intravenous Q12H   vancomycin variable dose per unstable renal function (pharmacist dosing)   Does not apply See admin instructions   vitamin B-12  1,000 mcg Oral Daily   Continuous Infusions:  ceFEPime (MAXIPIME) IV Stopped (03/02/21 0012)   PRN Meds: ALPRAZolam, HYDROcodone-acetaminophen, nitroGLYCERIN   Vital Signs    Vitals:   03/01/21 1804 03/01/21 2300 03/02/21 0500 03/02/21 0850  BP: 113/69 126/72  128/75  Pulse: 90 91  76  Resp: 19 18    Temp: (!) 97.5 F (36.4 C) 97.9 F (36.6 C)  97.7 F (36.5 C)  TempSrc:  Oral    SpO2: 100% 100%  100%  Weight:   104.5 kg   Height:        Intake/Output Summary (Last 24 hours) at 03/02/2021 0938 Last data filed at 03/02/2021 0847 Gross per 24 hour  Intake 300 ml  Output --  Net 300 ml   Filed Weights   02/28/21 0500 03/01/21 0500 03/02/21 0500  Weight:  103.4 kg 103.2 kg 104.5 kg    Telemetry    A. fib, 70s to 80s bpm - Personally Reviewed  ECG    No new tracings - Personally Reviewed  Physical Exam   GEN: No acute distress.   Neck: No JVD. Cardiac: IRIR, II/VI systolic murmurs RUSB, no rubs, or gallops.  Respiratory: Clear to auscultation bilaterally.  GI: Soft, nontender, non-distended.   MS: No edema. Neuro:  Alert and oriented x 3; Nonfocal.  Psych: Normal affect.  Labs    Chemistry Recent Labs  Lab 02/26/21 0520 02/27/21 0630 02/28/21 0615 03/01/21 1056 03/02/21 0556  NA 135 134* 132* 132* 132*  K 4.7 5.6* 5.2* 5.4* 6.1*  CL 99 97* 97* 97* 95*  CO2 28 27 27 26 24   GLUCOSE 91 90 118* 109* 83  BUN 35* 50* 32* 44* 50*  CREATININE 3.47* 4.24* 3.58* 4.56* 4.86*  CALCIUM 8.4* 8.6* 8.6* 8.6* 9.2  PROT  --  5.5*  --   --   --   ALBUMIN 2.3* 2.5*  --   --   --   AST  --  15  --   --   --   ALT  --  11  --   --   --   ALKPHOS  --  100  --   --   --   BILITOT  --  1.3*  --   --   --   GFRNONAA 18* 14* 17* 13* 12*  ANIONGAP 8 10 8 9 13      Hematology Recent Labs  Lab 02/28/21 0615 03/01/21 1056 03/02/21 0556  WBC 4.8 4.8 6.0  RBC 2.78* 2.74* 2.91*  HGB 8.7* 8.6* 9.2*  HCT 27.9* 27.6* 29.3*  MCV 100.4* 100.7* 100.7*  MCH 31.3 31.4 31.6  MCHC 31.2 31.2 31.4  RDW 21.2* 21.1* 21.0*  PLT 113* 123* 127*    Cardiac EnzymesNo results for input(s): TROPONINI in the last 168 hours. No results for input(s): TROPIPOC in the last 168 hours.   BNPNo results for input(s): BNP, PROBNP in the last 168 hours.   DDimer No results for input(s): DDIMER in the last 168 hours.   Radiology    No results found.  Cardiac Studies   2D echo 02/22/2021: 1. Left ventricular ejection fraction, by estimation, is 40 to 45%. The  left ventricle has mildly decreased function. Left ventricular endocardial  border not optimally defined to evaluate regional wall motion. Left  ventricular diastolic parameters are   indeterminate.   2. Right ventricular systolic function is normal. The right ventricular  size is normal. Tricuspid regurgitation signal is inadequate for assessing  PA pressure.   3. Left atrial size was mildly dilated.   4. A small pericardial effusion is present. The pericardial effusion is  posterior to the left ventricle. Moderate pleural effusion in the left  lateral region.   5. The mitral valve is normal in structure. Mild mitral valve  regurgitation. No evidence of mitral stenosis.   6. The aortic valve is normal in structure. Aortic valve regurgitation is  not visualized. Aortic valve sclerosis/calcification is present, without  any evidence of aortic stenosis.   7. Moderately dilated pulmonary artery.   8. The inferior vena cava is dilated in size with <50% respiratory  variability, suggesting right atrial pressure of 15 mmHg. __________  Limited echo 09/28/2020: 1. Left ventricular ejection fraction, by estimation, is 45 to 50%. The  left ventricle has mildly decreased function. Left ventricular endocardial  border not optimally defined to evaluate regional wall motion. There is  mild left ventricular hypertrophy.   2. Right ventricular systolic function is moderately reduced. The right  ventricular size is normal. Mildly increased right ventricular wall  thickness. There is moderately elevated pulmonary artery systolic  pressure.   3. The mitral valve is abnormal. Trivial mitral valve regurgitation.   4. The aortic valve is tricuspid. There is mild thickening of the aortic  valve.   5. The inferior vena cava is dilated in size with <50% respiratory  variability, suggesting right atrial pressure of 15 mmHg.  __________  2D echo 09/27/2020: 1. Poor acoustic windows Endocardium is not well seen I would recomm  limited echo with Definity to help define LVEF and regional wall motion. .  There is mild left ventricular hypertrophy. Left ventricular diastolic  parameters  are indeterminate.   2. RV is not seen well enough to evaluate function. . The right  ventricular size is mildly enlarged.   3. Left atrial size was severely dilated.   4. Right atrial size was severely dilated.   5. Mild mitral valve regurgitation.   6. Tricuspid valve regurgitation is mild to moderate.   7. The  aortic valve is tricuspid. Aortic valve regurgitation is not  visualized. Mild aortic valve sclerosis is present, with no evidence of  aortic valve stenosis.  __________  LHC 09/2020:   Ost LAD to Prox LAD lesion is 50% stenosed.   2nd Mrg lesion is 99% stenosed.   A drug-eluting stent was successfully placed using a STENT RESOLUTE ONYX 2.5X15.   Post intervention, there is a 30% residual stenosis.   1.  Severe single-vessel coronary artery disease with 99% subtotal stenosis of the second obtuse marginal branch, treated with a 2.5 x 15 mm resolute Onyx DES.  30% residual stenosis at the case completion due to resistant/calcified lesion type even with high-pressure noncompliant balloon angioplasty. 2.  Mild to moderate nonobstructive proximal LAD stenosis 3.  Mild nonobstructive RCA stenosis  Recommendations: Aggressive medical therapy, as long as no bleeding complications arise, would resume apixaban tomorrow, aspirin 81 mg daily x30 days, and clopidogrel 75 mg daily x minimum 6 months. __________   2D echo 09/2020: 1. Left ventricular ejection fraction, by estimation, is 60 to 65%. The  left ventricle has normal function. The left ventricle has no regional  wall motion abnormalities. Left ventricular diastolic parameters are  indeterminate.   2. Right ventricular systolic function is normal. The right ventricular  size is normal. Tricuspid regurgitation signal is inadequate for assessing  PA pressure.   3. Left atrial size was moderately dilated.   4. Right atrial size was moderately dilated.   5. Tricuspid valve regurgitation is mild to moderate.   6. The mitral valve is  normal in structure. Mild mitral valve  regurgitation.   7. Rhythym is atrial fibrillation   Patient Profile     75 y.o. 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 fib on Eliquis, ESRD on HD, hypotension, DM2, anemia of chronic disease, morbid obesity, and demand ischemia who was recently admitted in late 01/2021 with choledocholithiasis status post ERCP and stenting to the CBD on 86/7 complicated by GNR bacteremia who was readmitted on 12/17 with AMS felt to be related to toxic metabolic encephalopathy secondary to possible infection including cellulitis of the left hip and thigh as well as an abscess of the right hip status post draining with admission complicated by melena and acute on chronic anemia of chronic disease requiring 1 unit of PRBC on 12/24.  Assessment & Plan    1.  Melena with acute on chronic anemia of chronic disease: -Reports having had a BM this morning with no hematochezia or melena -Status post 1 unit of PRBC on 12/24 -Hemoglobin stable on low-dose apixaban -Continue to monitor Hgb, if this remains stable, in the outpatient setting, would recommend titration of apixaban to 5 mg twice daily with continued close monitoring of Hgb -No invasive procedures planned given his comorbid conditions  2.  CAD involving the native coronary arteries with elevated high-sensitivity troponin: -Never with chest pain -Chronic stable dyspnea -High-sensitivity troponin 2463, felt to be elevated in the setting of demand ischemia with known CAD, ESRD requiring HD, acute on chronic anemia of chronic disease in the context of GI bleeding, and possible infection -No longer on Plavix given the GI bleeding issues and thrombocytopenia with most recent stenting being greater than 6 months prior -Apixaban as outlined above  3.  Permanent A. fib: -Ventricular rate is well controlled -Continue Lopressor -CHA2DS2-VASc at least  5 -Tolerating low-dose apixaban as outlined above with recommendation  to escalate to 5 mg twice daily down the road if Hgb remains stable  4.  HFmrEF: -Volume management per HD -Lopressor as outlined above -Relative hypotension requiring midodrine precludes escalation of GDMT at this time, escalate as/if able  5.  ESRD/hyperkalemia: -HD per nephrology      For questions or updates, please contact [place] Please consult [email or web address] for contact info under Cardiology/STEMI.    Signed, Christell Faith, PA-C [place] Pager: [phone] 03/02/2021, 9:38 AM

## 2021-03-02 NOTE — Progress Notes (Signed)
Chaplain attempted reach patient by phone.

## 2021-03-02 NOTE — Progress Notes (Signed)
[facility]  ROUNDING NOTE   Subjective:   Patient seen and evaluated during dialysis   HEMODIALYSIS FLOWSHEET:  Blood Flow Rate (mL/min): 350 mL/min Arterial Pressure (mmHg): -130 mmHg Venous Pressure (mmHg): 100 mmHg Transmembrane Pressure (mmHg): 70 mmHg Ultrafiltration Rate (mL/min): 580 mL/min Dialysate Flow Rate (mL/min): 500 ml/min Conductivity: Machine : 13.9 Conductivity: Machine : 13.9 Dialysis Fluid Bolus: Normal Saline Bolus Amount (mL): 250 mL  Patient resting well, tolerating treatment well No complaints at this time   Objective:  Vital signs in last 24 hours:  Temp:  [97.5 F (36.4 C)-97.9 F (36.6 C)] 97.8 F (36.6 C) (12/27 1026) Pulse Rate:  [76-102] 99 (12/27 1145) Resp:  [13-21] 13 (12/27 1145) BP: (94-128)/(60-96) 97/64 (12/27 1145) SpO2:  [99 %-100 %] 100 % (12/27 1115) Weight:  [104.5 kg-106.1 kg] 106.1 kg (12/27 1026)  Weight change: 1.3 kg Filed Weights   03/01/21 0500 03/02/21 0500 03/02/21 1026  Weight: 103.2 kg 104.5 kg 106.1 kg    Intake/Output: I/O last 3 completed shifts: In: 141.9 [I.V.:41.9; IV Piggyback:100] Out: 200 [Urine:200]   Intake/Output this shift:  Total I/O In: 300 [IV Piggyback:300] Out: -   Physical Exam: General: NAD, laying in bed  Head: Normocephalic, atraumatic. Moist oral mucosal membranes  Eyes: Anicteric  Lungs:  clear bilaterally, normal effort  Heart: Irregular  Abdomen:  Soft, obese  Extremities: +dependent edema.  Right BKA left AKA - clean dressings  Neurologic: Nonfocal, moving all four extremities  Skin: No lesions  Access: Left AVF    Basic Metabolic Panel: Recent Labs  Lab 02/26/21 0520 02/27/21 0630 02/28/21 0615 03/01/21 1056 03/02/21 0556  NA 135 134* 132* 132* 132*  K 4.7 5.6* 5.2* 5.4* 6.1*  CL 99 97* 97* 97* 95*  CO2 28 27 27 26 24   GLUCOSE 91 90 118* 109* 83  BUN 35* 50* 32* 44* 50*  CREATININE 3.47* 4.24* 3.58* 4.56* 4.86*  CALCIUM 8.4* 8.6* 8.6* 8.6* 9.2   MG  --   --  1.8 1.8 1.9  PHOS 3.4  --   --   --   --      Liver Function Tests: Recent Labs  Lab 02/26/21 0520 02/27/21 0630  AST  --  15  ALT  --  11  ALKPHOS  --  100  BILITOT  --  1.3*  PROT  --  5.5*  ALBUMIN 2.3* 2.5*    No results for input(s): LIPASE, AMYLASE in the last 168 hours.  No results for input(s): AMMONIA in the last 168 hours.   CBC: Recent Labs  Lab 02/24/21 0942 02/26/21 0520 02/26/21 1528 02/27/21 0630 02/28/21 0615 03/01/21 1056 03/02/21 0556  WBC 5.6 6.6 6.0 5.9 4.8 4.8 6.0  NEUTROABS 2.8 3.7 3.5 3.0  --   --   --   HGB 9.2* 8.5* 8.1* 8.0* 8.7* 8.6* 9.2*  HCT 29.2* 27.3* 26.1* 26.3* 27.9* 27.6* 29.3*  MCV 97.7 100.4* 100.0 100.8* 100.4* 100.7* 100.7*  PLT 60* 87* 91* 105* 113* 123* 127*     Cardiac Enzymes: No results for input(s): CKTOTAL, CKMB, CKMBINDEX, TROPONINI in the last 168 hours.  BNP: Invalid input(s): POCBNP  CBG: No results for input(s): GLUCAP in the last 168 hours.   Microbiology: Results for orders placed or performed during the hospital encounter of 02/20/21  Resp Panel by RT-PCR (Flu A&B, Covid) Nasopharyngeal Swab     Status: None   Collection Time: 02/20/21  2:49 PM   Specimen: Nasopharyngeal Swab;  Nasopharyngeal(NP) swabs in vial transport medium  Result Value Ref Range Status   SARS Coronavirus 2 by RT PCR NEGATIVE NEGATIVE Final    Comment: (NOTE) SARS-CoV-2 target nucleic acids are NOT DETECTED.  The SARS-CoV-2 RNA is generally detectable in upper respiratory specimens during the acute phase of infection. The lowest concentration of SARS-CoV-2 viral copies this assay can detect is 138 copies/mL. A negative result does not preclude SARS-Cov-2 infection and should not be used as the sole basis for treatment or other patient management decisions. A negative result may occur with  improper specimen collection/handling, submission of specimen other than nasopharyngeal swab, presence of viral mutation(s)  within the areas targeted by this assay, and inadequate number of viral copies(<138 copies/mL). A negative result must be combined with clinical observations, patient history, and epidemiological information. The expected result is Negative.  Fact Sheet for Patients:  EntrepreneurPulse.com.au  Fact Sheet for Healthcare Providers:  IncredibleEmployment.be  This test is no t yet approved or cleared by the [place] FDA and  has been authorized for detection and/or diagnosis of SARS-CoV-2 by FDA under an Emergency Use Authorization (EUA). This EUA will remain  in effect (meaning this test can be used) for the duration of the COVID-19 declaration under Section 564(b)(1) of the Act, 21 U.S.C.section 360bbb-3(b)(1), unless the authorization is terminated  or revoked sooner.       Influenza A by PCR NEGATIVE NEGATIVE Final   Influenza B by PCR NEGATIVE NEGATIVE Final    Comment: (NOTE) The Xpert Xpress SARS-CoV-2/FLU/RSV plus assay is intended as an aid in the diagnosis of influenza from Nasopharyngeal swab specimens and should not be used as a sole basis for treatment. Nasal washings and aspirates are unacceptable for Xpert Xpress SARS-CoV-2/FLU/RSV testing.  Fact Sheet for Patients: EntrepreneurPulse.com.au  Fact Sheet for Healthcare Providers: IncredibleEmployment.be  This test is not yet approved or cleared by the [place] FDA and has been authorized for detection and/or diagnosis of SARS-CoV-2 by FDA under an Emergency Use Authorization (EUA). This EUA will remain in effect (meaning this test can be used) for the duration of the COVID-19 declaration under Section 564(b)(1) of the Act, 21 U.S.C. section 360bbb-3(b)(1), unless the authorization is terminated or revoked.  Performed at [facility], [address] [place], [place] [address]   Culture, blood (single)     Status: None    Collection Time: 02/20/21  9:05 PM   Specimen: BLOOD  Result Value Ref Range Status   Specimen Description BLOOD BLOOD RIGHT FOREARM  Final   Special Requests   Final    BOTTLES DRAWN AEROBIC AND ANAEROBIC Blood Culture results may not be optimal due to an excessive volume of blood received in culture bottles   Culture   Final    NO GROWTH 5 DAYS Performed at [facility], [address] [place], [place] [address]    Report Status 02/25/2021 FINAL  Final  MRSA Next Gen by PCR, Nasal     Status: None   Collection Time: 02/21/21  7:39 AM   Specimen: Nasal Mucosa; Nasal Swab  Result Value Ref Range Status   MRSA by PCR Next Gen NOT DETECTED NOT DETECTED Final    Comment: (NOTE) The GeneXpert MRSA Assay (FDA approved for NASAL specimens only), is one component of a comprehensive MRSA colonization surveillance program. It is not intended to diagnose MRSA infection nor to guide or monitor treatment for MRSA infections. Test performance is not FDA approved in patients less than  64 years old. Performed at [facility], [place], [place] [address]   Aerobic Culture w Gram Stain (superficial specimen)     Status: None   Collection Time: 02/25/21  5:25 PM   Specimen: Joint, Hip; Wound  Result Value Ref Range Status   Specimen Description   Final    HIP RIGHT HIP WOUND Performed at [facility], [address] [place], [place] [address]    Special Requests   Final    NONE Performed at [facility], [address] [place], [place] [address]    Gram Stain   Final    FEW WBC PRESENT, PREDOMINANTLY MONONUCLEAR NO ORGANISMS SEEN Performed at [facility], [place] [address] [place], [place] [address]    Culture RARE STAPHYLOCOCCUS EPIDERMIDIS  Final   Report Status 02/28/2021 FINAL  Final   Organism ID, Bacteria STAPHYLOCOCCUS EPIDERMIDIS  Final      Susceptibility   Staphylococcus epidermidis - MIC*    CIPROFLOXACIN >=8  RESISTANT Resistant     ERYTHROMYCIN >=8 RESISTANT Resistant     GENTAMICIN 8 INTERMEDIATE Intermediate     OXACILLIN >=4 RESISTANT Resistant     TETRACYCLINE >=16 RESISTANT Resistant     VANCOMYCIN 1 SENSITIVE Sensitive     TRIMETH/SULFA 80 RESISTANT Resistant     CLINDAMYCIN >=8 RESISTANT Resistant     RIFAMPIN <=0.5 SENSITIVE Sensitive     Inducible Clindamycin NEGATIVE Sensitive     * RARE STAPHYLOCOCCUS EPIDERMIDIS  Anaerobic culture w Gram Stain     Status: None (Preliminary result)   Collection Time: 02/26/21 12:54 PM   Specimen: Fluid; Abscess  Result Value Ref Range Status   Specimen Description   Final    FLUID Performed at [facility], [address], [place], [place] [address]    Special Requests   Final    NONE Performed at [facility], [address] [place], [place] [address]    Gram Stain   Final    RARE WBC PRESENT, PREDOMINANTLY MONONUCLEAR NO ORGANISMS SEEN Performed at [facility], [place] [address] [place], [place] [address]    Culture   Final    NO ANAEROBES ISOLATED; CULTURE IN PROGRESS FOR 5 DAYS   Report Status PENDING  Incomplete  Aerobic Culture w Gram Stain (superficial specimen)     Status: None   Collection Time: 02/26/21  6:26 PM   Specimen: Wound  Result Value Ref Range Status   Specimen Description   Final    WOUND Performed at [facility], [address], [place], [place] [address]    Special Requests   Final    NONE Performed at [facility], [address], [place], [place] [address]    Gram Stain   Final    NO WBC SEEN RARE GRAM POSITIVE COCCI RARE GRAM POSITIVE RODS Performed at [facility], [employer] [address], [place],  [address]    Culture   Final    RARE STAPHYLOCOCCUS WARNERI RARE ENTEROCOCCUS FAECIUM VANCOMYCIN RESISTANT ENTEROCOCCUS ISOLATED    Report Status 03/01/2021 FINAL  Final   Organism ID, Bacteria STAPHYLOCOCCUS WARNERI  Final   Organism ID, Bacteria  ENTEROCOCCUS FAECIUM  Final      Susceptibility   Enterococcus faecium - MIC*    AMPICILLIN >=32 RESISTANT Resistant     VANCOMYCIN >=32 RESISTANT Resistant     GENTAMICIN SYNERGY SENSITIVE Sensitive     LINEZOLID 2 SENSITIVE Sensitive     *  RARE ENTEROCOCCUS FAECIUM   Staphylococcus warneri - MIC*    CIPROFLOXACIN 4 RESISTANT Resistant     ERYTHROMYCIN >=8 RESISTANT Resistant     GENTAMICIN <=0.5 SENSITIVE Sensitive     OXACILLIN >=4 RESISTANT Resistant     TETRACYCLINE >=16 RESISTANT Resistant     VANCOMYCIN 1 SENSITIVE Sensitive     TRIMETH/SULFA <=10 SENSITIVE Sensitive     CLINDAMYCIN RESISTANT Resistant     RIFAMPIN <=0.5 SENSITIVE Sensitive     Inducible Clindamycin POSITIVE Resistant     * RARE STAPHYLOCOCCUS WARNERI    Coagulation Studies: No results for input(s): LABPROT, INR in the last 72 hours.   Urinalysis: No results for input(s): COLORURINE, LABSPEC, PHURINE, GLUCOSEU, HGBUR, BILIRUBINUR, KETONESUR, PROTEINUR, UROBILINOGEN, NITRITE, LEUKOCYTESUR in the last 72 hours.  Invalid input(s): APPERANCEUR    Imaging: No results found.   Medications:    ceFEPime (MAXIPIME) IV Stopped (03/02/21 0012)     apixaban  2.5 mg Oral BID   atorvastatin  40 mg Oral QHS   epoetin (EPOGEN/PROCRIT) injection  6,000 Units Intravenous Q T,Th,Sa-HD   feeding supplement (NEPRO CARB STEADY)  237 mL Oral TID BM   finasteride  5 mg Oral Daily   lidocaine  1 patch Transdermal Q24H   melatonin  5 mg Oral QHS   metoprolol tartrate  12.5 mg Oral BID   midodrine  10 mg Oral TID WC   multivitamin  1 tablet Oral QHS   pantoprazole (PROTONIX) IV  40 mg Intravenous Q12H   rOPINIRole  1 mg Oral QHS   sevelamer carbonate  1,600 mg Oral Once per day on Sun Mon Wed Fri   sevelamer carbonate  1,600 mg Oral BID WC   sodium chloride flush  10 mL Intravenous Q12H   vancomycin variable dose per unstable renal function (pharmacist dosing)   Does not apply See admin instructions   vitamin  B-12  1,000 mcg Oral Daily      Assessment/ Plan:  Mr. JAHZION BROGDEN is a 75 y.o. white male with end stage renal disease on hemodialysis, peripheral vascular disease, hypotension, diabetes mellitus type II, hyperlipidemia, right BKA and left AKA who is admitted to [facility] on 02/20/2021 for NSTEMI, initial episode of care [facility]) [I21.4] Fever, unspecified fever cause [R50.9] Altered mental status, unspecified altered mental status type [A07.62] Acute metabolic encephalopathy [U63.33] Acute congestive heart failure, unspecified heart failure type ([place]) [I50.9]  [place] left AVF 96kg  End-stage renal disease on HD TTS.  Currently receiving dialysis with UF goal 1.5 L as tolerated.  Monitoring blood pressure with decreased blood flow rate to ensure UF goal.  Next treatment scheduled for Thursday.   2. Anemia of chronic kidney disease: now with GI bleed. With thrombocytopenia -continues to improve to 127 today Lab Results  Component Value Date   HGB 9.2 (L) 03/02/2021   Continue EPO with HD treatment.  Status post PRBC transfusions Appreciate GI input. Holding apixaban  3. Secondary Hyperparathyroidism: Lab Results  Component Value Date   CALCIUM 9.2 03/02/2021   PHOS 3.4 02/26/2021  Calcium and phosphorus at desired goal. Continue sevelamer with meals.   4.  Hypotension: BP 98/58 during dialysis Continue midodrine.   5. Cellulitis: afebrile. No leukocytosis.  Continue cefepime and vancomycin Appreciate Infectious Disease input.     LOS: 9 Antonio Phillips 12/27/202212:31 PM

## 2021-03-02 NOTE — Progress Notes (Signed)
PROGRESS NOTE   Antonio Phillips  XBJ:[id number] DOB: June 29, 1945 DOA: 02/20/2021 PCP: Marsh Dolly, MD  Brief Narrative:  75 year old skilled facility dwelling white male ESRD HD TTS with chronic hypotension on midodrine PVD right BKA left AKA DM TY 2 CAD status post DES to obtuse marginal Permanent A. fib CHADS2 score >4 Eliquis  Recent admission 11/27 through 12/6 choledocholithiasis status post ERCP-brief stepdown stay-patient had biliary duct dilatation removal of 1 stone received 7-day course of Zosyn That admission pertinent for acute superimposed on chronic thrombocytopenia felt secondary to acute illness-at that admission Lyrica dosage increased from 75 to 100 mg daily  Current Readmit to ICU on 02/21/2021 altered mental status Lactic acid 2.4 troponin 1557-->2463 BNP >4500 CT head negative Code sepsis called started broad-spectrum antibiotics-CT chest not suggestive pneumonia-thought to have UTI but no urine was sent and he is a dialysis patient who doe snot make much urine  Eventually found to have possible source of infection in the right thigh.  Hospital-Problem based course  9 cm X 2 cm X 3 cm abscess of right hip S/p IR drain Cellulitis on left hip and thigh --CT showed a collection at rt thigh surgical site sinus s/p IR aspiration with cx growing staph warneri and VRE --Pt  also has left thigh swelling and erythema - imaging no abscess- looks like cellulitis/soft tissue infection  with wound on the stump --ID consulted --was on vanc and cefepime Plan: --change vanc to dapto, and d/c cefepime today, per ID --right prosthetic joint needs to be removed, per ID  Toxic metabolic encephalopathy secondary to possible infection, resolved  Melena --observed on 12/23.  Hgb stable ~8 --GI consulted.  Plan no endoscopy for now given high risk with anesthesia, per cardio --transfuse 1u pRBC on 12/24 for Hgb 8 (given extensive CAD) --monitor Hgb while resuming  anticoagulation --cont PPI BID  CAD status post DES 09/2020  --hold ASA and plavix for now 2/2 GI bleed --cont statin  demand type II MI   troponin peak 2400.  Cardiology following  Permanent A. fib CHADS2 score >4 --cont metop --cont Eliquis at 2.5 mg BID, per cardio, and monitor for GI bleed. --If no further bleeding ensues, escalation to 5 mg BID can be considered in the future as outpatient.  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  Recent admission for Gallstone pancreatitis and choledocholithiasis s/p ERCP stone removal completing 7 days Zosyn --Had plastic stenting on ERCP 12/1-plastic stent was placed and it was recommended to repeat ERCP to remove the stent off of anticoagulation   DVT prophylaxis: HY:QMVHQIO Code Status: Full code  Family Communication:  Status is: inpatient Dispo:   The patient is from: SNF Anticipated d/c is to: SNF Anticipated d/c date is: undetermined Patient currently is not medically stable to d/c due to: IV abx, monitor for bleeding while resuming anticoagulation   Subjective: No further bloody BM.  Pt was not agitated about leaving the hospital today.   Objective: Vitals:   03/02/21 1500 03/02/21  1521 03/02/21 1818 03/02/21 1942  BP: 107/65 112/80 111/74 103/61  Pulse:  (!) 110 (!) 109 66  Resp: 14 18 19 16   Temp:  97.7 F (36.5 C) 97.6 F (36.4 C) 97.8 F (36.6 C)  TempSrc:   Oral Oral  SpO2:   100% 95% 100%  Weight:      Height:        Intake/Output Summary (Last 24 hours) at 03/02/2021 2032 Last data filed at 03/02/2021 1421 Gross per 24 hour  Intake 300 ml  Output 1501 ml  Net -1201 ml   Filed Weights   03/02/21 0500 03/02/21 1026 03/02/21 1433  Weight: 104.5 kg 106.1 kg 105.7 kg    Examination:  Constitutional: NAD, AAOx3 HEENT: conjunctivae and lids normal, EOMI CV: No cyanosis.   RESP: normal respiratory effort, on 3L Extremities: left AKA, right BKA SKIN: warm, dry Neuro: II - XII grossly intact.   Psych: resigned mood and affect.     Data Reviewed: personally reviewed   CBC    Component Value Date/Time   WBC 6.0 03/02/2021 0556   RBC 2.91 (L) 03/02/2021 0556   HGB 9.2 (L) 03/02/2021 0556   HGB 15.2 11/14/2012 2045   HCT 29.3 (L) 03/02/2021 0556   HCT 45.0 11/14/2012 2045   PLT 127 (L) 03/02/2021 0556   PLT 116 (L) 11/14/2012 2045   MCV 100.7 (H) 03/02/2021 0556   MCV 95 11/14/2012 2045   MCH 31.6 03/02/2021 0556   MCHC 31.4 03/02/2021 0556   RDW 21.0 (H) 03/02/2021 0556   RDW 15.2 (H) 11/14/2012 2045   LYMPHSABS 2.2 02/27/2021 0630   MONOABS 0.5 02/27/2021 0630   EOSABS 0.2 02/27/2021 0630   BASOSABS 0.0 02/27/2021 0630   CMP Latest Ref Rng & Units 03/02/2021 03/01/2021 02/28/2021  Glucose 70 - 99 mg/dL 83 109(H) 118(H)  BUN 8 - 23 mg/dL 50(H) 44(H) 32(H)  Creatinine 0.61 - 1.24 mg/dL 4.86(H) 4.56(H) 3.58(H)  Sodium 135 - 145 mmol/L 132(L) 132(L) 132(L)  Potassium 3.5 - 5.1 mmol/L 6.1(H) 5.4(H) 5.2(H)  Chloride 98 - 111 mmol/L 95(L) 97(L) 97(L)  CO2 22 - 32 mmol/L 24 26 27   Calcium 8.9 - 10.3 mg/dL 9.2 8.6(L) 8.6(L)  Total Protein 6.5 - 8.1 g/dL - - -  Total Bilirubin 0.3 - 1.2 mg/dL - - -  Alkaline Phos 38 - 126 U/L - - -  AST 15 - 41 U/L - - -  ALT 0 - 44 U/L - - -     Radiology Studies: No results found.   Scheduled Meds:  apixaban  2.5 mg Oral BID   atorvastatin  40 mg Oral QHS   epoetin (EPOGEN/PROCRIT) injection   6,000 Units Intravenous Q T,Th,Sa-HD   feeding supplement (NEPRO CARB STEADY)  237 mL Oral TID BM   finasteride  5 mg Oral Daily   lidocaine  1 patch Transdermal Q24H   melatonin  5 mg Oral QHS   metoprolol tartrate  12.5 mg Oral BID   midodrine  10 mg Oral TID WC   multivitamin  1 tablet Oral QHS   pantoprazole (PROTONIX) IV  40 mg Intravenous Q12H   rOPINIRole  1 mg Oral QHS   sevelamer carbonate  1,600 mg Oral Once per day on Sun Mon Wed Fri   sevelamer carbonate  1,600 mg Oral BID WC   sodium chloride flush  10 mL Intravenous Q12H   vitamin B-12  1,000 mcg Oral Daily   Continuous Infusions:  LOS: 9 days    Enzo Bi, MD [facility] To contact the attending provider between 7A-7P or the covering provider during after hours 7P-7A, please log into the web site [email or web address] and access using universal [place] password for that web site. If you do not have the password, please call the hospital operator.  03/02/2021, 8:32 PM

## 2021-03-02 NOTE — Consult Note (Signed)
Pharmacy Antibiotic Note  Antonio Phillips is a 75 y.o. male admitted on 02/20/2021 with cellulitis/soft tissue infection. Pharmacy has been consulted for vancomycin and cefepime dosing.   Wound culture growing staphylococcus warneri and enterococcus faecium (resistance to vancomycin). This was a superficial swab. ID is following.   Plan: Day 5 of abx. Vancomycin random is 35 will hold giving vancomycin today post HD. Will obtain a vancomycin random with AM labs. May need to switch to linezolid or dapto. .   Continue cefepime 1 g q12H.    Height: 5\' 11"  (180.3 cm) Weight: 104.5 kg (230 lb 6.1 oz) IBW/kg (Calculated) : 75.3  Temp (24hrs), Avg:97.6 F (36.4 C), Min:97.5 F (36.4 C), Max:97.9 F (36.6 C)  Recent Labs  Lab 02/26/21 0520 02/26/21 1528 02/27/21 0630 02/28/21 0615 03/01/21 1056 03/02/21 0556  WBC 6.6 6.0 5.9 4.8 4.8 6.0  CREATININE 3.47*  --  4.24* 3.58* 4.56* 4.86*  VANCORANDOM  --   --   --   --   --  35     Estimated Creatinine Clearance: 15.9 mL/min (A) (by C-G formula based on SCr of 4.86 mg/dL (H)).    Allergies  Allergen Reactions   Simvastatin Other (See Comments)    Antimicrobials this admission: 12/23 vancomycin >>  12/23 cefepime >>  12/19 cefepime x 1 12/18 vancomycin x 1  12/17 cefepime/flagyl x 1  Microbiology results: 12/17 BCx: NGTD 12/18 MRSA PCR: negative 12/22 wound cx: RARE STAPHYLOCOCCUS EPIDERMIDIS 12/23: wound cx:  RARE STAPHYLOCOCCUS WARNERI RARE ENTEROCOCCUS FAECIUM (VANCOMYCIN RESISTANT ENTEROCOCCUS ISOLATED) 12/23 Abscess cx: NGTD.   Thank you for allowing pharmacy to be a part of this patients care.  Oswald Hillock, PharmD 03/02/2021 7:53 AM

## 2021-03-02 NOTE — Consult Note (Signed)
Pharmacy Antibiotic Note  Antonio Phillips is a 75 y.o. male admitted on 02/20/2021 with cellulitis w/ r/o for Rt PJI (staph warneri and VRE).  Pharmacy has been consulted for Daptomycin dosing.  Plan:  **last HD on Tuesday 11/27 Initiate Daptomycin 8mg /kg (850mg ) q48h starting on 12/28 per ID rec. (HD-dosing TTS as outpatient)   Height: 5\' 11"  (180.3 cm) Weight: 105.7 kg (233 lb 0.4 oz) IBW/kg (Calculated) : 75.3  Temp (24hrs), Avg:97.8 F (36.6 C), Min:97.6 F (36.4 C), Max:98.1 F (36.7 C)  Recent Labs  Lab 02/26/21 0520 02/26/21 1528 02/27/21 0630 02/28/21 0615 03/01/21 1056 03/02/21 0556  WBC 6.6 6.0 5.9 4.8 4.8 6.0  CREATININE 3.47*  --  4.24* 3.58* 4.56* 4.86*  VANCORANDOM  --   --   --   --   --  35    Estimated Creatinine Clearance: 16 mL/min (A) (by C-G formula based on SCr of 4.86 mg/dL (H)).    Allergies  Allergen Reactions   Simvastatin Other (See Comments)    Antimicrobials this admission: Cefepime & Vancomycin (12/23-26) Daptomycin (12/28>>  Dose adjustments this admission: ESRD-HD (f/u Nephrology note for full IP HD schedule.)  Microbiology results: 12/17 Bcx (single): NGTD UCx: cancelled  12/23 superficial wound: Rare Staph warneri & rare Enterococcus faecium (VRE) -- see below 12/23 abscess from fluid: NGTD 12/22 (from Rt Hip joint) wound: Rare Staph Epidermidis -- see below 12/18 MRSA/Flu/Covid: negative   Thank you for allowing pharmacy to be a part of this patients care.  Lorna Dibble 03/02/2021 8:34 PM

## 2021-03-02 NOTE — Progress Notes (Signed)
Antonio Antigua, MD [address] [place], [place], [place], [address] [address], [place], [place], [place], [address] Phone: [phone]  Fax: [phone]   Subjective:  Patient resting in bed comfortably.  Denies abdominal pain.  Denies any episodes of bleeding  Objective: Exam: Vital signs in last 24 hours: Vitals:   03/02/21 1433 03/02/21 1437 03/02/21 1500 03/02/21 1521  BP:   107/65 112/80  Pulse:    (!) 110  Resp: 16 13 14 18   Temp: 98.1 F (36.7 C)   97.7 F (36.5 C)  TempSrc: Oral     SpO2:    100%  Weight: 105.7 kg     Height:       Weight change: 1.3 kg  Intake/Output Summary (Last 24 hours) at 03/02/2021 1712 Last data filed at 03/02/2021 1421 Gross per 24 hour  Intake 300 ml  Output 1501 ml  Net -1201 ml    Constitutional: General:   Alert,  Well-developed, well-nourished, pleasant and cooperative in NAD BP 112/80 (BP Location: Right Arm)    Pulse (!) 110    Temp 97.7 F (36.5 C)    Resp 18    Ht 5\' 11"  (1.803 m)    Wt 105.7 kg    SpO2 100%    BMI 32.50 kg/m   Eyes:  Sclera clear, no icterus.   Conjunctiva pink.   Ears:  No scars, lesions or masses, Normal auditory acuity. Nose:  No deformity, discharge, or lesions. Mouth:  No deformity or lesions, oropharynx pink & moist.  Neck:  Supple; no masses, trachea midline  Respiratory: Normal respiratory effort  Gastrointestinal:  Soft, non-tender and non-distended without masses, hepatosplenomegaly or hernias noted.  No guarding or rebound tenderness.     Cardiac: No clubbing or edema.  No cyanosis. Normal posterior tibial pedal pulses noted.  Lymphatic:  No significant cervical adenopathy.  Psych:  Alert and cooperative. Normal mood and affect.  Musculoskeletal:  Head normocephalic, atraumatic.  Skin: Warm. Intact without significant lesions or rashes. No jaundice.  Neurologic:  Face symmetrical, tongue midline, Normal sensation to touch  Psych:  Alert and oriented x3, Alert and  cooperative. Normal mood and affect.   Lab Results: Lab Results  Component Value Date   WBC 6.0 03/02/2021   HGB 9.2 (L) 03/02/2021   HCT 29.3 (L) 03/02/2021   MCV 100.7 (H) 03/02/2021   PLT 127 (L) 03/02/2021   Micro Results: Recent Results (from the past 240 hour(s))  Culture, blood (single)     Status: None   Collection Time: 02/20/21  9:05 PM   Specimen: BLOOD  Result Value Ref Range Status   Specimen Description BLOOD BLOOD RIGHT FOREARM  Final   Special Requests   Final    BOTTLES DRAWN AEROBIC AND ANAEROBIC Blood Culture results may not be optimal due to an excessive volume of blood received in culture bottles   Culture   Final    NO GROWTH 5 DAYS Performed at [facility], [address], [place], [place] [address]    Report Status 02/25/2021 FINAL  Final  MRSA Next Gen by PCR, Nasal     Status: None   Collection Time: 02/21/21  7:39 AM   Specimen: Nasal Mucosa; Nasal Swab  Result Value Ref Range Status   MRSA by PCR Next Gen NOT DETECTED NOT DETECTED Final    Comment: (NOTE) The GeneXpert MRSA Assay (FDA approved for NASAL specimens only), is one component of a comprehensive MRSA colonization surveillance  program. It is not intended to diagnose MRSA infection nor to guide or monitor treatment for MRSA infections. Test performance is not FDA approved in patients less than 16 years old. Performed at [facility], [place], [place] [address]   Aerobic Culture w Gram Stain (superficial specimen)     Status: None   Collection Time: 02/25/21  5:25 PM   Specimen: Joint, Hip; Wound  Result Value Ref Range Status   Specimen Description   Final    HIP RIGHT HIP WOUND Performed at [facility], [address] [place], [place] [address]    Special Requests   Final    NONE Performed at [facility], [address] [place], [place] [address]    Gram Stain   Final    FEW WBC PRESENT, PREDOMINANTLY MONONUCLEAR NO  ORGANISMS SEEN Performed at [facility], [place] [address] [place], [place] [address]    Culture RARE STAPHYLOCOCCUS EPIDERMIDIS  Final   Report Status 02/28/2021 FINAL  Final   Organism ID, Bacteria STAPHYLOCOCCUS EPIDERMIDIS  Final      Susceptibility   Staphylococcus epidermidis - MIC*    CIPROFLOXACIN >=8 RESISTANT Resistant     ERYTHROMYCIN >=8 RESISTANT Resistant     GENTAMICIN 8 INTERMEDIATE Intermediate     OXACILLIN >=4 RESISTANT Resistant     TETRACYCLINE >=16 RESISTANT Resistant     VANCOMYCIN 1 SENSITIVE Sensitive     TRIMETH/SULFA 80 RESISTANT Resistant     CLINDAMYCIN >=8 RESISTANT Resistant     RIFAMPIN <=0.5 SENSITIVE Sensitive     Inducible Clindamycin NEGATIVE Sensitive     * RARE STAPHYLOCOCCUS EPIDERMIDIS  Anaerobic culture w Gram Stain     Status: None (Preliminary result)   Collection Time: 02/26/21 12:54 PM   Specimen: Fluid; Abscess  Result Value Ref Range Status   Specimen Description   Final    FLUID Performed at [facility], [address] [place], [place] [address]    Special Requests   Final    NONE Performed at [facility], [address] [place], [place] [address]    Gram Stain   Final    RARE WBC PRESENT, PREDOMINANTLY MONONUCLEAR NO ORGANISMS SEEN Performed at [facility], [place] [address] [place], [place] [address]    Culture   Final    NO ANAEROBES ISOLATED; CULTURE IN PROGRESS FOR 5 DAYS   Report Status PENDING  Incomplete  Aerobic Culture w Gram Stain (superficial specimen)     Status: None   Collection Time: 02/26/21  6:26 PM   Specimen: Wound  Result Value Ref Range Status   Specimen Description   Final    WOUND Performed at [facility], [address] [place], [place] [address]    Special Requests   Final    NONE Performed at [facility], [address] [place], [place] [address]    Gram Stain   Final    NO WBC SEEN RARE GRAM POSITIVE COCCI RARE GRAM POSITIVE  RODS Performed at [facility], [place] [address] [place],  [address]    Culture   Final    RARE STAPHYLOCOCCUS WARNERI RARE ENTEROCOCCUS FAECIUM VANCOMYCIN RESISTANT ENTEROCOCCUS ISOLATED    Report Status 03/01/2021 FINAL  Final   Organism ID, Bacteria STAPHYLOCOCCUS WARNERI  Final   Organism ID, Bacteria ENTEROCOCCUS FAECIUM  Final      Susceptibility   Enterococcus faecium - MIC*  AMPICILLIN >=32 RESISTANT Resistant     VANCOMYCIN >=32 RESISTANT Resistant     GENTAMICIN SYNERGY SENSITIVE Sensitive     LINEZOLID 2 SENSITIVE Sensitive     * RARE ENTEROCOCCUS FAECIUM   Staphylococcus warneri - MIC*    CIPROFLOXACIN 4 RESISTANT Resistant     ERYTHROMYCIN >=8 RESISTANT Resistant     GENTAMICIN <=0.5 SENSITIVE Sensitive     OXACILLIN >=4 RESISTANT Resistant     TETRACYCLINE >=16 RESISTANT Resistant     VANCOMYCIN 1 SENSITIVE Sensitive     TRIMETH/SULFA <=10 SENSITIVE Sensitive     CLINDAMYCIN RESISTANT Resistant     RIFAMPIN <=0.5 SENSITIVE Sensitive     Inducible Clindamycin POSITIVE Resistant     * RARE STAPHYLOCOCCUS WARNERI   Studies/Results: No results found. Medications:  Scheduled Meds:  apixaban  2.5 mg Oral BID   atorvastatin  40 mg Oral QHS   epoetin (EPOGEN/PROCRIT) injection  6,000 Units Intravenous Q T,Th,Sa-HD   feeding supplement (NEPRO CARB STEADY)  237 mL Oral TID BM   finasteride  5 mg Oral Daily   lidocaine  1 patch Transdermal Q24H   melatonin  5 mg Oral QHS   metoprolol tartrate  12.5 mg Oral BID   midodrine  10 mg Oral TID WC   multivitamin  1 tablet Oral QHS   pantoprazole (PROTONIX) IV  40 mg Intravenous Q12H   rOPINIRole  1 mg Oral QHS   sevelamer carbonate  1,600 mg Oral Once per day on Sun Mon Wed Fri   sevelamer carbonate  1,600 mg Oral BID WC   sodium chloride flush  10 mL Intravenous Q12H   vancomycin variable dose per unstable renal function (pharmacist dosing)   Does not apply See admin instructions   vitamin B-12  1,000 mcg  Oral Daily   Continuous Infusions:  ceFEPime (MAXIPIME) IV Stopped (03/02/21 0012)   PRN Meds:.ALPRAZolam, HYDROcodone-acetaminophen, nitroGLYCERIN   Assessment: Principal Problem:   Acute metabolic encephalopathy Active Problems:   NSTEMI, initial episode of care ([place])   Longstanding persistent atrial fibrillation ([place]) Melena   Plan: Patient has been restarted on Eliquis by cardiology as of yesterday and has not had any episodes of recurrent bleeding  Hemoglobin is stable  PPI IV twice daily  Continue serial CBCs and transfuse PRN Avoid NSAIDs Maintain 2 large-bore IV lines Please page GI on-call with any acute hemodynamic changes, or signs of active GI bleeding  GI service will sign off at this time  GI clinic follow-up with primary GI in 2 to 3 weeks after discharge   LOS: 9 days   Antonio Antigua, MD 03/02/2021, 5:12 PM

## 2021-03-02 NOTE — Progress Notes (Signed)
Date of Admission:  02/20/2021     ID: Antonio Phillips is a 75 y.o. male  Principal Problem:   Acute metabolic encephalopathy Active Problems:   NSTEMI, initial episode of care ([place])   Longstanding persistent atrial fibrillation ([facility])    Subjective: Pt says he still has left thigh pain  Medications:   apixaban  2.5 mg Oral BID   atorvastatin  40 mg Oral QHS   epoetin (EPOGEN/PROCRIT) injection  6,000 Units Intravenous Q T,Th,Sa-HD   feeding supplement (NEPRO CARB STEADY)  237 mL Oral TID BM   finasteride  5 mg Oral Daily   lidocaine  1 patch Transdermal Q24H   melatonin  5 mg Oral QHS   metoprolol tartrate  12.5 mg Oral BID   midodrine  10 mg Oral TID WC   multivitamin  1 tablet Oral QHS   pantoprazole (PROTONIX) IV  40 mg Intravenous Q12H   rOPINIRole  1 mg Oral QHS   sevelamer carbonate  1,600 mg Oral Once per day on Sun Mon Wed Fri   sevelamer carbonate  1,600 mg Oral BID WC   sodium chloride flush  10 mL Intravenous Q12H   vancomycin variable dose per unstable renal function (pharmacist dosing)   Does not apply See admin instructions   vitamin B-12  1,000 mcg Oral Daily    Objective: Vital signs in last 24 hours: Temp:  [97.6 F (36.4 C)-98.1 F (36.7 C)] 97.8 F (36.6 C) (12/27 1942) Pulse Rate:  [40-117] 66 (12/27 1942) Resp:  [13-24] 16 (12/27 1942) BP: (93-128)/(58-80) 103/61 (12/27 1942) SpO2:  [95 %-100 %] 100 % (12/27 1942) Weight:  [104.5 kg-106.1 kg] 105.7 kg (12/27 1433)  PHYSICAL EXAM:  General: Alert, cooperative, no distress, appears stated age. Lungs: Clear to auscultation bilaterally. No Wheezing or Rhonchi. No rales. Heart: Regular rate and rhythm, no murmur, rub or gallop. Abdomen: Soft, non-tender,not distended. Bowel sounds normal. No masses Extremities: left AKA-  stump wound covered with dressing/ thigh less swollen, indurated, less tender and less erythematous Rt BKA Sinus over the lateral thigh surgical scar Skin: No rashes or  lesions. Or bruising Lymph: Cervical, supraclavicular normal. Neurologic: Grossly non-focal  Lab Results Recent Labs    03/01/21 1056 03/02/21 0556  WBC 4.8 6.0  HGB 8.6* 9.2*  HCT 27.6* 29.3*  NA 132* 132*  K 5.4* 6.1*  CL 97* 95*  CO2 26 24  BUN 44* 50*  CREATININE 4.56* 4.86*     Microbiology:  02/26/21- US guided aspiration of the rt hip area fluid has staph warnerii and VRE  12/22 superfical culture of the sinus is staph epi   Assessment/Plan: ?75 yr male presented with lethargy from HD    Was thought to have sepsis- source was not clear- CT abdomen showed cystitis but no urine was sent and he is a dialysis patient who doe snot make much urine   Now he has a rt thigh surgical site sinus and need to r/o rt PJI- The CT showed a collection- IR aspiration doneshowed staph warneri and VRE- this needs to be treated- will change vanco to dapto and DC cefepime He has a prosthetic joint and that has to be removed Will discuss with Dr.Menz He also has left thigh swelling and erythema - imaging no abscess- looks like cellulitis/soft tissue infection  with wound on the stump.   Anemia- got blood transfusion  CHF  Afib- anticoagulation on hold because of melena But as not significant he has  been restarted on eliquis Anasarca   Recent E.coli bacteremia due to choledocholithiasis, acute pancreatitis s/p ERCP and CBD stent placement on 02/04/21. Repeat CT shows resolution of CBD dilatation   Rt BKA- stump fine Left AKA stump dry wound      ESRD on HD   CAD   Discussed the management with patient and care team

## 2021-03-03 DIAGNOSIS — A419 Sepsis, unspecified organism: Secondary | ICD-10-CM | POA: Diagnosis present

## 2021-03-03 DIAGNOSIS — T8484XD Pain due to internal orthopedic prosthetic devices, implants and grafts, subsequent encounter: Secondary | ICD-10-CM | POA: Diagnosis not present

## 2021-03-03 DIAGNOSIS — Z515 Encounter for palliative care: Secondary | ICD-10-CM

## 2021-03-03 DIAGNOSIS — Z789 Other specified health status: Secondary | ICD-10-CM

## 2021-03-03 DIAGNOSIS — R6520 Severe sepsis without septic shock: Secondary | ICD-10-CM

## 2021-03-03 DIAGNOSIS — I509 Heart failure, unspecified: Secondary | ICD-10-CM | POA: Diagnosis not present

## 2021-03-03 DIAGNOSIS — I248 Other forms of acute ischemic heart disease: Secondary | ICD-10-CM | POA: Diagnosis not present

## 2021-03-03 DIAGNOSIS — L02415 Cutaneous abscess of right lower limb: Secondary | ICD-10-CM

## 2021-03-03 DIAGNOSIS — L0291 Cutaneous abscess, unspecified: Secondary | ICD-10-CM | POA: Diagnosis not present

## 2021-03-03 DIAGNOSIS — G9341 Metabolic encephalopathy: Secondary | ICD-10-CM | POA: Diagnosis not present

## 2021-03-03 LAB — CBC
HCT: 29 % — ABNORMAL LOW (ref 39.0–52.0)
Hemoglobin: 8.9 g/dL — ABNORMAL LOW (ref 13.0–17.0)
MCH: 31.6 pg (ref 26.0–34.0)
MCHC: 30.7 g/dL (ref 30.0–36.0)
MCV: 102.8 fL — ABNORMAL HIGH (ref 80.0–100.0)
Platelets: 129 10*3/uL — ABNORMAL LOW (ref 150–400)
RBC: 2.82 MIL/uL — ABNORMAL LOW (ref 4.22–5.81)
RDW: 21.5 % — ABNORMAL HIGH (ref 11.5–15.5)
WBC: 4.6 10*3/uL (ref 4.0–10.5)
nRBC: 0 % (ref 0.0–0.2)

## 2021-03-03 LAB — ANAEROBIC CULTURE W GRAM STAIN

## 2021-03-03 LAB — BASIC METABOLIC PANEL
Anion gap: 11 (ref 5–15)
BUN: 31 mg/dL — ABNORMAL HIGH (ref 8–23)
CO2: 28 mmol/L (ref 22–32)
Calcium: 8.9 mg/dL (ref 8.9–10.3)
Chloride: 98 mmol/L (ref 98–111)
Creatinine, Ser: 3.53 mg/dL — ABNORMAL HIGH (ref 0.61–1.24)
GFR, Estimated: 17 mL/min — ABNORMAL LOW (ref >60)
Glucose, Bld: 96 mg/dL (ref 70–99)
Potassium: 4.6 mmol/L (ref 3.5–5.1)
Sodium: 137 mmol/L (ref 135–145)

## 2021-03-03 LAB — HEPATITIS B SURFACE ANTIBODY, QUANTITATIVE: Hep B S AB Quant (Post): <3.1 m[IU]/mL — ABNORMAL LOW (ref >9.9)

## 2021-03-03 LAB — MAGNESIUM: Magnesium: 1.8 mg/dL (ref 1.7–2.4)

## 2021-03-03 NOTE — Discharge Summary (Addendum)
Physician Discharge Summary  Antonio Phillips OLM:[id number] DOB: 11-22-1945 DOA: 02/20/2021  PCP: Marsh Dolly, MD  Admit date: 02/20/2021 Discharge date: 03/03/2021  Discharge disposition: Left AGAINST MEDICAL ADVICE   Recommendations for Outpatient Follow-Up:   Outpatient follow-up with PCP and orthopedic surgeon as soon as possible   Discharge Diagnosis:   Principal Problem:   Severe sepsis [facility]) Active Problems:   Demand ischemia ([place])   Acute metabolic encephalopathy   Longstanding persistent atrial fibrillation (HCC)   Abscess of right thigh    Discharge Condition: Stable.  Diet recommendation:  Diet Order             DIET DYS 2 Room service appropriate? Yes; Fluid consistency: Thin  Diet effective now                     Code Status: Full Code     Hospital Course:   [name] is a 75 year old male with medical history significant for recent admission from 01/31/2021 through 02/09/2021 for E. 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  He presented from the outpatient hemodialysis center because of altered mental status/lethargy.    He was found to have severe sepsis secondary to right hip abscess and left hip/thigh cellulitis.  He was treated with empiric IV antibiotics.  Right hip abscess was aspirated by IR and culture revealed staph warneri and VRE.  ID was consulted to assist with management.  He developed melena while in the hospital.  GI was consulted but endoscopy was not performed because he is at increased risk for anesthetic complications.  He was transfused with 1 unit of packed red blood cells for acute blood loss anemia  and CAD status.  He was on aspirin and Plavix which were held because of melena.   Cardiologist was consulted because of elevated troponins which were attributed to demand ischemia.  He had hyperkalemia that improved with hemodialysis.  He also had thrombocytopenia that improved.   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  Dr. Rudene Christians, orthopedic surgeon, recommended that patient be transferred to the [facility] hospital where he had his prosthetic joint placed for further management.  Plan to initiate transfer to the [facility] in [place] was discussed with the patient.  However, patient declined transfer and insisted that he wanted to leave [place].  He understands that he could develop life-threatening complications from poorly or inadequately treated right hip infection.  He also said he did not think inpatient transfer to the [facility] hospital will be possible.  He said he would have to have an appointment or walk into their emergency room.  I explained that I could at least a hospital to hospital transfer, but he insisted on leaving [place].  His mental status is back to baseline and he is in his right frame of mind.  This was discussed with Dr. Steva Ready, [place] specialist and Dr. Rudene Christians, orthopedic surgeon, via secure chat.    Medical Consultants:   Infectious disease specialist Cardiologist Orthopedic surgeon Gastroenterologist   Discharge Exam:    Vitals:   03/03/21 0418 03/03/21 0425 03/03/21 0808 03/03/21 1605  BP: 128/74  108/63  121/73  Pulse: (!) 111 (!) 105 (!) 108 (!) 108  Resp: 20  18 18   Temp: 97.8 F (36.6 C)  97.7 F (36.5 C) (!) 97.5 F (36.4 C)  TempSrc:      SpO2: 100%  99% 96%  Weight:      Height:         GEN: NAD SKIN: Warm and dry EYES: No pallor or icterus ENT: MMM CV: Irregular rate and rhythm, mildly tachycardic PULM: CTA B ABD: soft, obese, NT, +BS CNS: AAO x 3, non  focal EXT: Right BKA, left AKA with stump wound.  Swelling of the lateral aspect of the right thigh.  Swelling, mild erythema and tenderness of the left thigh   The results of significant diagnostics from this hospitalization (including imaging, microbiology, ancillary and laboratory) are listed below for reference.     Procedures and Diagnostic Studies:   ECHOCARDIOGRAM COMPLETE  Result Date: 02/22/2021    ECHOCARDIOGRAM REPORT   Patient Name:   Antonio Phillips [facility] Date of Exam: 02/22/2021 Medical Rec #:  [id number]          Height:       71.0 in Accession #:    [id number]         Weight:       218.7 lb Date of Birth:  May 01, 1945          BSA:          2.190 m Patient Age:    62 years           BP:           105/68 mmHg Patient Gender: M                  HR:           108 bpm. Exam Location:  [facility] Procedure: 2D Echo, Color Doppler, Cardiac Doppler and Intracardiac            Opacification Agent Indications:     Elevated troponin  History:         Patient has prior history of Echocardiogram examinations, most                  recent 09/28/2020. ESRD and PAD, Arrythmias:Atrial Fibrillation;                  Risk Factors:Diabetes and Dyslipidemia.  Sonographer:     Charmayne Sheer Referring Phys:  [id number] IONGEXBM A ARIDA Diagnosing Phys: Kathlyn Sacramento MD  Sonographer Comments: Suboptimal apical window and suboptimal subcostal window. Image acquisition challenging due to patient body habitus. IMPRESSIONS  1. Left ventricular ejection fraction, by estimation, is 40 to 45%. The left ventricle has mildly decreased function. Left ventricular endocardial border not optimally defined to evaluate regional wall motion. Left ventricular diastolic parameters are indeterminate.  2. Right ventricular systolic function is normal. The right ventricular size is normal. Tricuspid regurgitation signal is inadequate for assessing PA pressure.  3. Left atrial size was mildly dilated.  4. A small pericardial effusion is present. The  pericardial effusion is posterior to the left ventricle. Moderate pleural effusion in the left lateral region.  5. The mitral valve is normal in structure. Mild mitral valve regurgitation. No evidence of mitral stenosis.  6. The aortic valve is normal in structure. Aortic valve regurgitation is not visualized. Aortic valve sclerosis/calcification is present, without any evidence of aortic stenosis.  7. Moderately dilated pulmonary artery.  8. The inferior vena cava is  dilated in size with <50% respiratory variability, suggesting right atrial pressure of 15 mmHg. FINDINGS  Left Ventricle: Left ventricular ejection fraction, by estimation, is 40 to 45%. The left ventricle has mildly decreased function. Left ventricular endocardial border not optimally defined to evaluate regional wall motion. Definity contrast agent was given IV to delineate the left ventricular endocardial borders. The left ventricular internal cavity size was normal in size. There is no left ventricular hypertrophy. Left ventricular diastolic parameters are indeterminate. Right Ventricle: The right ventricular size is normal. No increase in right ventricular wall thickness. Right ventricular systolic function is normal. Tricuspid regurgitation signal is inadequate for assessing PA pressure. Left Atrium: Left atrial size was mildly dilated. Right Atrium: Right atrial size was normal in size. Pericardium: A small pericardial effusion is present. The pericardial effusion is posterior to the left ventricle. Mitral Valve: The mitral valve is normal in structure. Mild mitral annular calcification. Mild mitral valve regurgitation. No evidence of mitral valve stenosis. MV peak gradient, 5.4 mmHg. The mean mitral valve gradient is 2.0 mmHg. Tricuspid Valve: The tricuspid valve is normal in structure. Tricuspid valve regurgitation is not demonstrated. No evidence of tricuspid stenosis. Aortic Valve: The aortic valve is normal in structure. Aortic valve  regurgitation is not visualized. Aortic valve sclerosis/calcification is present, without any evidence of aortic stenosis. Aortic valve mean gradient measures 9.0 mmHg. Aortic valve peak  gradient measures 17.6 mmHg. Aortic valve area, by VTI measures 1.56 cm. Pulmonic Valve: The pulmonic valve was normal in structure. Pulmonic valve regurgitation is mild. No evidence of pulmonic stenosis. Aorta: The aortic root is normal in size and structure. Pulmonary Artery: The pulmonary artery is moderately dilated. Venous: The inferior vena cava is dilated in size with less than 50% respiratory variability, suggesting right atrial pressure of 15 mmHg. IAS/Shunts: No atrial level shunt detected by color flow Doppler. Additional Comments: There is a moderate pleural effusion in the left lateral region.  LEFT VENTRICLE PLAX 2D LVIDd:         5.03 cm   Diastology LVIDs:         3.64 cm   LV e' medial:    6.96 cm/s LV PW:         0.91 cm   LV E/e' medial:  13.1 LV IVS:        0.92 cm   LV e' lateral:   7.29 cm/s LVOT diam:     2.10 cm   LV E/e' lateral: 12.5 LV SV:         53 LV SV Index:   24 LVOT Area:     3.46 cm  RIGHT VENTRICLE RV Basal diam:  3.11 cm LEFT ATRIUM         Index       RIGHT ATRIUM           Index LA diam:    4.30 cm 1.96 cm/m  RA Area:     19.90 cm                                 RA Volume:   49.90 ml  22.78 ml/m  AORTIC VALVE                     PULMONIC VALVE AV Area (Vmax):    1.46 cm      PV Vmax:          1.09 m/s  AV Area (Vmean):   1.44 cm      PV Vmean:         69.800 cm/s AV Area (VTI):     1.56 cm      PV VTI:           0.190 m AV Vmax:           210.00 cm/s   PV Peak grad:     4.8 mmHg AV Vmean:          140.000 cm/s  PV Mean grad:     2.0 mmHg AV VTI:            0.343 m       PR End Diast Vel: 3.07 msec AV Peak Grad:      17.6 mmHg AV Mean Grad:      9.0 mmHg LVOT Vmax:         88.80 cm/s LVOT Vmean:        58.400 cm/s LVOT VTI:          0.154 m LVOT/AV VTI ratio: 0.45  AORTA Ao Root diam:  3.30 cm MITRAL VALVE MV Area (PHT): 5.68 cm    SHUNTS MV Area VTI:   2.42 cm    Systemic VTI:  0.15 m MV Peak grad:  5.4 mmHg    Systemic Diam: 2.10 cm MV Mean grad:  2.0 mmHg MV Vmax:       1.16 m/s MV Vmean:      54.8 cm/s MV Decel Time: 134 msec MV E velocity: 90.83 cm/s Kathlyn Sacramento MD Electronically signed by Kathlyn Sacramento MD Signature Date/Time: 02/22/2021/3:32:30 PM    Final      Labs:   Basic Metabolic Panel: Recent Labs  Lab 02/26/21 0520 02/27/21 0630 02/28/21 0615 03/01/21 1056 03/02/21 0556 03/03/21 0430  NA 135 134* 132* 132* 132* 137  K 4.7 5.6* 5.2* 5.4* 6.1* 4.6  CL 99 97* 97* 97* 95* 98  CO2 28 27 27 26 24 28   GLUCOSE 91 90 118* 109* 83 96  BUN 35* 50* 32* 44* 50* 31*  CREATININE 3.47* 4.24* 3.58* 4.56* 4.86* 3.53*  CALCIUM 8.4* 8.6* 8.6* 8.6* 9.2 8.9  MG  --   --  1.8 1.8 1.9 1.8  PHOS 3.4  --   --   --   --   --    GFR Estimated Creatinine Clearance: 22 mL/min (A) (by C-G formula based on SCr of 3.53 mg/dL (H)). Liver Function Tests: Recent Labs  Lab 02/26/21 0520 02/27/21 0630  AST  --  15  ALT  --  11  ALKPHOS  --  100  BILITOT  --  1.3*  PROT  --  5.5*  ALBUMIN 2.3* 2.5*   No results for input(s): LIPASE, AMYLASE in the last 168 hours. No results for input(s): AMMONIA in the last 168 hours. Coagulation profile No results for input(s): INR, PROTIME in the last 168 hours.  CBC: Recent Labs  Lab 02/26/21 0520 02/26/21 1528 02/27/21 0630 02/28/21 0615 03/01/21 1056 03/02/21 0556 03/03/21 0430  WBC 6.6 6.0 5.9 4.8 4.8 6.0 4.6  NEUTROABS 3.7 3.5 3.0  --   --   --   --   HGB 8.5* 8.1* 8.0* 8.7* 8.6* 9.2* 8.9*  HCT 27.3* 26.1* 26.3* 27.9* 27.6* 29.3* 29.0*  MCV 100.4* 100.0 100.8* 100.4* 100.7* 100.7* 102.8*  PLT 87* 91* 105* 113* 123* 127* 129*   Cardiac Enzymes: No results for input(s): CKTOTAL, CKMB, CKMBINDEX,  TROPONINI in the last 168 hours. BNP: Invalid input(s): POCBNP CBG: No results for input(s): GLUCAP in the last 168  hours. D-Dimer No results for input(s): DDIMER in the last 72 hours. Hgb A1c No results for input(s): HGBA1C in the last 72 hours. Lipid Profile No results for input(s): CHOL, HDL, LDLCALC, TRIG, CHOLHDL, LDLDIRECT in the last 72 hours. Thyroid function studies No results for input(s): TSH, T4TOTAL, T3FREE, THYROIDAB in the last 72 hours.  Invalid input(s): FREET3 Anemia work up No results for input(s): VITAMINB12, FOLATE, FERRITIN, TIBC, IRON, RETICCTPCT in the last 72 hours. Microbiology Recent Results (from the past 240 hour(s))  Aerobic Culture w Gram Stain (superficial specimen)     Status: None   Collection Time: 02/25/21  5:25 PM   Specimen: Joint, Hip; Wound  Result Value Ref Range Status   Specimen Description   Final    HIP RIGHT HIP WOUND Performed at [facility], [address] [place], [place] [address]    Special Requests   Final    NONE Performed at [facility], [address] [place], [place] [address]    Gram Stain   Final    FEW WBC PRESENT, PREDOMINANTLY MONONUCLEAR NO ORGANISMS SEEN Performed at [place] [address], [place], [place] [address]    Culture RARE STAPHYLOCOCCUS EPIDERMIDIS  Final   Report Status 02/28/2021 FINAL  Final   Organism ID, Bacteria STAPHYLOCOCCUS EPIDERMIDIS  Final      Susceptibility   Staphylococcus epidermidis - MIC*    CIPROFLOXACIN >=8 RESISTANT Resistant     ERYTHROMYCIN >=8 RESISTANT Resistant     GENTAMICIN 8 INTERMEDIATE Intermediate     OXACILLIN >=4 RESISTANT Resistant     TETRACYCLINE >=16 RESISTANT Resistant     VANCOMYCIN 1 SENSITIVE Sensitive     TRIMETH/SULFA 80 RESISTANT Resistant     CLINDAMYCIN >=8 RESISTANT Resistant     RIFAMPIN <=0.5 SENSITIVE Sensitive     Inducible Clindamycin NEGATIVE Sensitive     * RARE STAPHYLOCOCCUS EPIDERMIDIS  Anaerobic culture w Gram Stain     Status: None   Collection Time: 02/26/21 12:54 PM   Specimen: Fluid; Abscess  Result Value Ref  Range Status   Specimen Description   Final    FLUID Performed at [facility], [address] [place], [place] [address]    Special Requests   Final    NONE Performed at [facility], [address] [place], [place] [address]    Gram Stain   Final    RARE WBC PRESENT, PREDOMINANTLY MONONUCLEAR NO ORGANISMS SEEN    Culture   Final    NO ANAEROBES ISOLATED Performed at [facility], [place] [address], [place], [place] [address]    Report Status 03/03/2021 FINAL  Final  Aerobic Culture w Gram Stain (superficial specimen)     Status: None   Collection Time: 02/26/21  6:26 PM   Specimen: Wound  Result Value Ref Range Status   Specimen Description   Final    WOUND Performed at [facility], [address], [place], [place] [address]    Special Requests   Final    NONE Performed at [facility], [address] [place], [place] [address]    Gram Stain   Final    NO WBC SEEN RARE GRAM POSITIVE COCCI RARE GRAM POSITIVE RODS Performed at [facility], [place] [address], [place], [place] [address]    Culture  Final    RARE STAPHYLOCOCCUS WARNERI RARE ENTEROCOCCUS FAECIUM VANCOMYCIN RESISTANT ENTEROCOCCUS ISOLATED    Report Status 03/01/2021 FINAL  Final   Organism ID, Bacteria STAPHYLOCOCCUS WARNERI  Final   Organism ID, Bacteria ENTEROCOCCUS FAECIUM  Final      Susceptibility   Enterococcus faecium - MIC*    AMPICILLIN >=32 RESISTANT Resistant     VANCOMYCIN >=32 RESISTANT Resistant     GENTAMICIN SYNERGY SENSITIVE Sensitive     LINEZOLID 2 SENSITIVE Sensitive     * RARE ENTEROCOCCUS FAECIUM   Staphylococcus warneri - MIC*    CIPROFLOXACIN 4 RESISTANT Resistant     ERYTHROMYCIN >=8 RESISTANT Resistant     GENTAMICIN <=0.5 SENSITIVE Sensitive     OXACILLIN >=4 RESISTANT Resistant     TETRACYCLINE >=16 RESISTANT Resistant     VANCOMYCIN 1 SENSITIVE Sensitive     TRIMETH/SULFA <=10 SENSITIVE Sensitive     CLINDAMYCIN  RESISTANT Resistant     RIFAMPIN <=0.5 SENSITIVE Sensitive     Inducible Clindamycin POSITIVE Resistant     * RARE STAPHYLOCOCCUS WARNERI     Discharge Instructions:   Recommend outpatient follow-up with PCP and orthopedic surgeon at the [facility] in [place]   If you experience worsening of your admission symptoms, develop shortness of breath, life threatening emergency, suicidal or homicidal thoughts you must seek medical attention immediately by calling 911 or calling your MD immediately  if symptoms less severe.   You must read complete instructions/literature along with all the possible adverse reactions/side effects for all the medicines you take and that have been prescribed to you. Take any new medicines after you have completely understood and accept all the possible adverse reactions/side effects.    Please note   You were cared for by a hospitalist during your hospital stay. If you have any questions about your discharge medications or the care you received while you were in the hospital after you are discharged, you can call the unit and asked to speak with the hospitalist on call if the hospitalist that took care of you is not available. Once you are discharged, your primary care physician will handle any further medical issues. Please note that NO REFILLS for any discharge medications will be authorized once you are discharged, as it is imperative that you return to your primary care physician (or establish a relationship with a primary care physician if you do not have one) for your aftercare needs so that they can reassess your need for medications and monitor your lab values.       Time coordinating discharge: 38 minutes  Signed:  Annelise Mccoy  [facility] 03/03/2021, 9:23 PM   Pager on [email or web address]. If 7PM-7AM, please contact night-coverage at [email or web address]

## 2021-03-03 NOTE — Care Management (Signed)
Notified patient leaving AMA Confirmed with Hilda Blades at [facility] that they will accept patient back with out discharge  EMS packet printed Bedside RN to call report EMS transport arranged

## 2021-03-03 NOTE — Progress Notes (Signed)
Chaplain On-Call responded to call from [employer], who reported the patient's request for a Chaplain to visit.  Chaplain met the patient and provided supportive listening as he described his lengthy hospitalization of five weeks, and multiple medical problems. He stated his worry about the absence of his motorized wheelchair, which is at the dialysis location following his last visit there on Saturday.  Patient stated that he expects to be discharged from the hospital later today.  Chaplain offered encouragement; spiritual and emotional support, and prayer.  Chaplain Remo Lipps., [facility]

## 2021-03-03 NOTE — Progress Notes (Signed)
Patient informed the provider Dr. Mal Misty he wish to leave the hospital AMA. Patient provided the [facility] document for signing. Patient has no questions or concerns at this time. Transportation being arranged by case management.

## 2021-03-03 NOTE — Consult Note (Addendum)
Consultation Note Date: 03/03/2021   Patient Name: Antonio Phillips  DOB: [birth year]  MRN: [id number]  Age / Sex: 75 y.o., male  PCP: Marsh Dolly, MD Referring Physician: Jennye Boroughs, MD  Reason for Consultation: Establishing goals of care  HPI/Patient Profile: 75 y.o. male  with past medical history of PVD with multiple amputations and hip repair, ESRD on HD, HFrEF, permanent A. fib, and CAD admitted on 02/20/2021 with altered mental status from [facility] where patient is a long-term resident.  Patient was recently admitted on 11/27 and discharged on 12/6 with cold Doe cholelithiasis status post ERCP he.  Also had biliary duct dilation removal of 1 stone and received 7-day course of Zosyn.    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  As per ID IV antibiotics were changed.  ID also recommends that prosthetic joint needs to be removed.  Palliative medicine team was consulted to discuss goals of care.   Clinical Assessment and Goals of Care: I have reviewed medical records including EPIC notes, labs and imaging, assessed the patient and then met with patient at bedside to discuss diagnosis prognosis, GOC, EOL wishes, disposition and options.  I introduced Palliative Medicine as specialized medical care for people living with serious illness. It focuses on providing relief from the symptoms and stress of a serious illness. The goal is to improve quality of life for both the patient and the family.  This patient is familiar to me as I followed him during his previous hospitalization.    I attempted to discuss a brief life review of what has transpired for the patient since his last hospitalization.  Patient says he has no idea.  He asked that I help him roll over in bed, which I did.  He says he feels well and that there is nothing wrong with him.  He also shares  that "well, maybe there are some things wrong with me but there is nothing that means I need to stay here in the hospital".  I reviewed with patient that he has a prosthetic joint that needs to be removed as well as a infection in his right hip.    Patient states he does not want to stay in the hospital he [name] wants to be transferred to the [place].  I shared that transferring him to the [place] at this point would not be helpful to him.  I shared that the medical team would like to be able to treat him here so that his condition does not worsen.  He says he has been getting "shit treatment" here and wants to go home.    He also shares he has signed himself out of many hospitals before and knows there is some paperwork he can sign so that he can leave.  He asked that I get this paperwork for him.  As far as functional and nutritional status the patient was not willing to discuss anything other than me facilitating him returning to [facility]  today.  We discussed patient's current illness and what it means in the larger context of patient's on-going co-morbidities.  Natural disease trajectory and expectations at EOL were discussed.  I attempted to elicit values and goals of care important to the patient.  I reviewed that his previous admission we had discussed his advance care planning documents.  I shared that he had wished for a natural death and completed a DNR form.  Patient has documents on file reflecting a DNR with a completed MOST form. However, code status is currently FULL CODE. He shared that he did not know about any of that he just wants to go home.  The difference between aggressive medical intervention and comfort care was considered in light of the patient's goals of care.  I shared that if the patient truly wants to go home and does not want to return to a hospital that we could enact his hospice benefits and symptoms can be managed at [facility].  He shared he does not want to give up on  treatment and he just does not want to receive treatment from this hospital any longer.  Discussed with patient/family the importance of continued conversation with family and the medical providers regarding overall plan of care and treatment options, ensuring decisions are within the context of the patients values and GOCs.    Questions and concerns were addressed.  I shared patient's wishes to sign himself out of the hospital with nursing and attending Dr. Mal Misty.  Primary Decision Maker PATIENT  Code Status/Advance Care Planning: Full code  Prognosis:   Unable to determine  Discharge Planning: To Be Determined  Primary Diagnoses: Present on Admission:  Acute metabolic encephalopathy   Physical Exam Constitutional:      Appearance: He is obese. He is not ill-appearing.  HENT:     Head: Normocephalic and atraumatic.     Mouth/Throat:     Mouth: Mucous membranes are moist.  Cardiovascular:     Rate and Rhythm: Normal rate.     Pulses: Normal pulses.  Pulmonary:     Effort: Pulmonary effort is normal.  Abdominal:     Palpations: Abdomen is soft.  Musculoskeletal:     Cervical back: Normal range of motion.     Comments: Limited mobility r/t bilateral LE amputations and obesity  Skin:    General: Skin is warm and dry.  Neurological:     Mental Status: He is alert. Mental status is at baseline.  Psychiatric:     Comments: Agitated, hyperfocused on receiving AMA paperwork to leave the hospital    Vital Signs: BP 108/63 (BP Location: Right Arm)    Pulse (!) 108    Temp 97.7 F (36.5 C)    Resp 18    Ht _0  (1.803 m)    Wt 105.7 kg    SpO2 99%    BMI 32.50 kg/m  Pain Scale: 0-10 POSS *See Group Information*: 1-Acceptable,Awake and alert Pain Score: 6  SpO2: SpO2: 99 % O2 Device:SpO2: 99 % O2 Flow Rate: .O2 Flow Rate (L/min): 2 L/min  Palliative Assessment/Data: 50%     I discussed this patient's plan of care with nurse Ubaldo Glassing, patient, Dr. Mal Misty .  Thank  you for this consult. Palliative medicine will continue to follow and assist holistically.   Time Total: 70 minutes Greater than 50%  of this time was spent counseling and coordinating care related to the above assessment and plan.  Signed by: Jordan Hawks, DNP, FNP-BC Palliative  Medicine    Please contact Palliative Medicine Team phone at [phone] for questions and concerns.  For individual provider: See Shea Evans

## 2021-03-03 NOTE — Progress Notes (Incomplete)
Progress Note    Antonio Phillips  EHM:[id number] DOB: 1945-09-26  DOA: 02/20/2021 PCP: Marsh Dolly, MD      Brief Narrative:    Medical records reviewed and are as summarized below:  Antonio Phillips is a 75 y.o. male ***      Assessment/Plan:   Principal Problem:   Acute metabolic encephalopathy Active Problems:   NSTEMI, initial episode of care ([place])   Longstanding persistent atrial fibrillation (HCC)          Body mass index is 32.5 kg/m.  Diet Order             DIET DYS 2 Room service appropriate? Yes; Fluid consistency: Thin  Diet effective now                      Consultants: ***  Procedures: ***    Medications:    apixaban  2.5 mg Oral BID   atorvastatin  40 mg Oral QHS   epoetin (EPOGEN/PROCRIT) injection  6,000 Units Intravenous Q T,Th,Sa-HD   feeding supplement (NEPRO CARB STEADY)  237 mL Oral TID BM   finasteride  5 mg Oral Daily   lidocaine  1 patch Transdermal Q24H   melatonin  5 mg Oral QHS   metoprolol tartrate  12.5 mg Oral BID   midodrine  10 mg Oral TID WC   multivitamin  1 tablet Oral QHS   pantoprazole (PROTONIX) IV  40 mg Intravenous Q12H   rOPINIRole  1 mg Oral QHS   sevelamer carbonate  1,600 mg Oral Once per day on Sun Mon Wed Fri   sevelamer carbonate  1,600 mg Oral BID WC   sodium chloride flush  10 mL Intravenous Q12H   vitamin B-12  1,000 mcg Oral Daily   Continuous Infusions:  DAPTOmycin (CUBICIN)  IV 850 mg (03/03/21 0911)     Anti-infectives (From admission, onward)    Start     Dose/Rate Route Frequency Ordered Stop   03/03/21 1000  DAPTOmycin (CUBICIN) 850 mg in sodium chloride 0.9 % IVPB        8 mg/kg  105.7 kg 134 mL/hr over 30 Minutes Intravenous Every 48 hours 03/02/21 2033     03/02/21 0750  vancomycin variable dose per unstable renal function (pharmacist dosing)  Status:  Discontinued         Does not apply See admin instructions 03/02/21 0751 03/02/21 2006   02/27/21 1200   vancomycin (VANCOCIN) IVPB 1000 mg/200 mL premix  Status:  Discontinued        1,000 mg 200 mL/hr over 60 Minutes Intravenous Every T-Th-Sa (Hemodialysis) 02/26/21 1905 03/02/21 0749   02/26/21 2000  ceFEPIme (MAXIPIME) 1 g in sodium chloride 0.9 % 100 mL IVPB  Status:  Discontinued        1 g 200 mL/hr over 30 Minutes Intravenous Every 24 hours 02/26/21 1905 03/02/21 2006   02/26/21 2000  vancomycin (VANCOREADY) IVPB 2000 mg/400 mL        2,000 mg 200 mL/hr over 120 Minutes Intravenous  Once 02/26/21 1905 02/27/21 1905   02/23/21 1200  vancomycin (VANCOCIN) IVPB 1000 mg/200 mL premix  Status:  Discontinued        1,000 mg 200 mL/hr over 60 Minutes Intravenous Every T-Th-Sa (Hemodialysis) 02/21/21 0259 02/22/21 1057   02/22/21 2100  ceFEPIme (MAXIPIME) 1 g in sodium chloride 0.9 % 100 mL IVPB  Status:  Discontinued  1 g 200 mL/hr over 30 Minutes Intravenous Every 24 hours 02/21/21 0259 02/23/21 1426   02/20/21 2200  vancomycin (VANCOREADY) IVPB 1500 mg/300 mL       See Hyperspace for full Linked Orders Report.   1,500 mg 150 mL/hr over 120 Minutes Intravenous  Once 02/20/21 2103 02/21/21 0428   02/20/21 2115  vancomycin (VANCOCIN) IVPB 1000 mg/200 mL premix       See Hyperspace for full Linked Orders Report.   1,000 mg 200 mL/hr over 60 Minutes Intravenous  Once 02/20/21 2103 02/21/21 0131   02/20/21 2100  ceFEPIme (MAXIPIME) 2 g in sodium chloride 0.9 % 100 mL IVPB        2 g 200 mL/hr over 30 Minutes Intravenous  Once 02/20/21 2059 02/20/21 2311   02/20/21 2100  metroNIDAZOLE (FLAGYL) IVPB 500 mg        500 mg 100 mL/hr over 60 Minutes Intravenous  Once 02/20/21 2059 02/20/21 2230   02/20/21 2100  vancomycin (VANCOCIN) IVPB 1000 mg/200 mL premix  Status:  Discontinued        1,000 mg 200 mL/hr over 60 Minutes Intravenous  Once 02/20/21 2059 02/20/21 2103              Family Communication/Anticipated D/C date and plan/Code Status   DVT prophylaxis: apixaban  (ELIQUIS) tablet 2.5 mg Start: 03/01/21 1200 apixaban (ELIQUIS) tablet 2.5 mg     Code Status: Full Code  Family Communication: *** Disposition Plan: ***   Status is: Inpatient  {Inpatient:23812}           Subjective:   ***  Objective:    Vitals:   03/02/21 1942 03/03/21 0418 03/03/21 0425 03/03/21 0808  BP: 103/61 128/74  108/63  Pulse: 66 (!) 111 (!) 105 (!) 108  Resp: 16 20  18   Temp: 97.8 F (36.6 C) 97.8 F (36.6 C)  97.7 F (36.5 C)  TempSrc: Oral     SpO2: 100% 100%  99%  Weight:      Height:       No data found.   Intake/Output Summary (Last 24 hours) at 03/03/2021 0959 Last data filed at 03/02/2021 1421 Gross per 24 hour  Intake --  Output 1501 ml  Net -1501 ml   Filed Weights   03/02/21 0500 03/02/21 1026 03/02/21 1433  Weight: 104.5 kg 106.1 kg 105.7 kg    Exam:  ***  GEN: NAD SKIN: No rash*** EYES: EOMI*** ENT: MMM CV: RRR PULM: CTA B ABD: soft, obese, NT, +BS CNS: AAO x 3, non focal EXT: Right BKA, right thigh swelling (lateral), left AKA with stump wound, left thigh swelling, tenderness and redness   Pressure Injury 02/02/21 Hip Left Stage 3 -  Full thickness tissue loss. Subcutaneous fat may be visible but bone, tendon or muscle are NOT exposed. open hole (Active)  02/02/21 0941  Location: Hip  Location Orientation: Left  Staging: Stage 3 -  Full thickness tissue loss. Subcutaneous fat may be visible but bone, tendon or muscle are NOT exposed.  Wound Description (Comments): open hole  Present on Admission: Yes     Pressure Injury 02/02/21 Coccyx Mid Stage 2 -  Partial thickness loss of dermis presenting as a shallow open injury with a red, pink wound bed without slough. opening to middle of coccyx (Active)  02/02/21 1252  Location: Coccyx  Location Orientation: Mid  Staging: Stage 2 -  Partial thickness loss of dermis presenting as a shallow open injury with a  red, pink wound bed without slough.  Wound Description  (Comments): opening to middle of coccyx  Present on Admission: Yes     Data Reviewed:   I have personally reviewed following labs and imaging studies:  Labs: Labs show the following:   Basic Metabolic Panel: Recent Labs  Lab 02/26/21 0520 02/27/21 0630 02/28/21 0615 03/01/21 1056 03/02/21 0556 03/03/21 0430  NA 135 134* 132* 132* 132* 137  K 4.7 5.6* 5.2* 5.4* 6.1* 4.6  CL 99 97* 97* 97* 95* 98  CO2 28 27 27 26 24 28   GLUCOSE 91 90 118* 109* 83 96  BUN 35* 50* 32* 44* 50* 31*  CREATININE 3.47* 4.24* 3.58* 4.56* 4.86* 3.53*  CALCIUM 8.4* 8.6* 8.6* 8.6* 9.2 8.9  MG  --   --  1.8 1.8 1.9 1.8  PHOS 3.4  --   --   --   --   --    GFR Estimated Creatinine Clearance: 22 mL/min (A) (by C-G formula based on SCr of 3.53 mg/dL (H)). Liver Function Tests: Recent Labs  Lab 02/26/21 0520 02/27/21 0630  AST  --  15  ALT  --  11  ALKPHOS  --  100  BILITOT  --  1.3*  PROT  --  5.5*  ALBUMIN 2.3* 2.5*   No results for input(s): LIPASE, AMYLASE in the last 168 hours. No results for input(s): AMMONIA in the last 168 hours. Coagulation profile No results for input(s): INR, PROTIME in the last 168 hours.  CBC: Recent Labs  Lab 02/26/21 0520 02/26/21 1528 02/27/21 0630 02/28/21 0615 03/01/21 1056 03/02/21 0556 03/03/21 0430  WBC 6.6 6.0 5.9 4.8 4.8 6.0 4.6  NEUTROABS 3.7 3.5 3.0  --   --   --   --   HGB 8.5* 8.1* 8.0* 8.7* 8.6* 9.2* 8.9*  HCT 27.3* 26.1* 26.3* 27.9* 27.6* 29.3* 29.0*  MCV 100.4* 100.0 100.8* 100.4* 100.7* 100.7* 102.8*  PLT 87* 91* 105* 113* 123* 127* 129*   Cardiac Enzymes: No results for input(s): CKTOTAL, CKMB, CKMBINDEX, TROPONINI in the last 168 hours. BNP (last 3 results) No results for input(s): PROBNP in the last 8760 hours. CBG: No results for input(s): GLUCAP in the last 168 hours. D-Dimer: No results for input(s): DDIMER in the last 72 hours. Hgb A1c: No results for input(s): HGBA1C in the last 72 hours. Lipid Profile: No results for  input(s): CHOL, HDL, LDLCALC, TRIG, CHOLHDL, LDLDIRECT in the last 72 hours. Thyroid function studies: No results for input(s): TSH, T4TOTAL, T3FREE, THYROIDAB in the last 72 hours.  Invalid input(s): FREET3 Anemia work up: No results for input(s): VITAMINB12, FOLATE, FERRITIN, TIBC, IRON, RETICCTPCT in the last 72 hours. Sepsis Labs: Recent Labs  Lab 02/28/21 0615 03/01/21 1056 03/02/21 0556 03/03/21 0430  WBC 4.8 4.8 6.0 4.6    Microbiology Recent Results (from the past 240 hour(s))  Aerobic Culture w Gram Stain (superficial specimen)     Status: None   Collection Time: 02/25/21  5:25 PM   Specimen: Joint, Hip; Wound  Result Value Ref Range Status   Specimen Description   Final    HIP RIGHT HIP WOUND Performed at [facility], [address] [place], [place] [address]    Special Requests   Final    NONE Performed at [facility], [address] [place], [place] [address]    Gram Stain   Final    FEW WBC PRESENT, PREDOMINANTLY MONONUCLEAR NO ORGANISMS SEEN Performed at [facility], [place] [address] [place], [place] [address]    Culture RARE STAPHYLOCOCCUS EPIDERMIDIS  Final   Report Status 02/28/2021 FINAL  Final   Organism ID, Bacteria STAPHYLOCOCCUS EPIDERMIDIS  Final      Susceptibility   Staphylococcus epidermidis - MIC*    CIPROFLOXACIN >=8 RESISTANT Resistant     ERYTHROMYCIN >=8 RESISTANT Resistant     GENTAMICIN 8 INTERMEDIATE Intermediate     OXACILLIN >=4 RESISTANT Resistant     TETRACYCLINE >=16 RESISTANT Resistant     VANCOMYCIN 1 SENSITIVE Sensitive     TRIMETH/SULFA 80 RESISTANT Resistant     CLINDAMYCIN >=8 RESISTANT Resistant     RIFAMPIN <=0.5 SENSITIVE Sensitive     Inducible Clindamycin NEGATIVE Sensitive     * RARE STAPHYLOCOCCUS EPIDERMIDIS  Anaerobic culture w Gram Stain     Status: None (Preliminary result)   Collection Time: 02/26/21 12:54 PM   Specimen: Fluid; Abscess  Result Value Ref Range Status    Specimen Description   Final    FLUID Performed at [facility], [address] [place], [place] [address]    Special Requests   Final    NONE Performed at [facility], [address] [place], [place] [address]    Gram Stain   Final    RARE WBC PRESENT, PREDOMINANTLY MONONUCLEAR NO ORGANISMS SEEN Performed at [facility], [place] [address] [place], [place] [address]    Culture   Final    NO ANAEROBES ISOLATED; CULTURE IN PROGRESS FOR 5 DAYS   Report Status PENDING  Incomplete  Aerobic Culture w Gram Stain (superficial specimen)     Status: None   Collection Time: 02/26/21  6:26 PM   Specimen: Wound  Result Value Ref Range Status   Specimen Description   Final    WOUND Performed at [facility], [address], [place], [place] [address]    Special Requests   Final    NONE Performed at [facility], [address], [place], [place] [address]    Gram Stain   Final    NO WBC SEEN RARE GRAM POSITIVE COCCI RARE GRAM POSITIVE RODS Performed at [facility], [place] [address] [place], [place] [address]    Culture   Final    RARE STAPHYLOCOCCUS WARNERI RARE ENTEROCOCCUS FAECIUM VANCOMYCIN RESISTANT ENTEROCOCCUS ISOLATED    Report Status 03/01/2021 FINAL  Final   Organism ID, Bacteria STAPHYLOCOCCUS WARNERI  Final   Organism ID, Bacteria ENTEROCOCCUS FAECIUM  Final      Susceptibility   Enterococcus faecium - MIC*    AMPICILLIN >=32 RESISTANT Resistant     VANCOMYCIN >=32 RESISTANT Resistant     GENTAMICIN SYNERGY SENSITIVE Sensitive     LINEZOLID 2 SENSITIVE Sensitive     * RARE ENTEROCOCCUS FAECIUM   Staphylococcus warneri - MIC*    CIPROFLOXACIN 4 RESISTANT Resistant     ERYTHROMYCIN >=8 RESISTANT Resistant     GENTAMICIN <=0.5 SENSITIVE Sensitive     OXACILLIN >=4 RESISTANT Resistant     TETRACYCLINE >=16 RESISTANT Resistant     VANCOMYCIN 1 SENSITIVE Sensitive     TRIMETH/SULFA <=10 SENSITIVE Sensitive      CLINDAMYCIN RESISTANT Resistant     RIFAMPIN <=0.5 SENSITIVE Sensitive     Inducible Clindamycin POSITIVE Resistant     * RARE STAPHYLOCOCCUS WARNERI    Procedures and diagnostic studies:  No results found.  LOS: 10 days   Marti Mclane  [facility]   Pager on [email or web address]. If 7PM-7AM, please contact night-coverage at [email or web address]     03/03/2021, 9:59 AM

## 2021-03-03 NOTE — Progress Notes (Signed)
Progress Note  Patient Name: Antonio Phillips Date of Encounter: 03/03/2021  Primary Cardiologist: Rockey Situ  Subjective   No chest pain, palpitations, or worsening dyspnea with stable chronic dyspnea.  Hemoglobin stable on low-dose apixaban.    Inpatient Medications    Scheduled Meds:  apixaban  2.5 mg Oral BID   atorvastatin  40 mg Oral QHS   epoetin (EPOGEN/PROCRIT) injection  6,000 Units Intravenous Q T,Th,Sa-HD   feeding supplement (NEPRO CARB STEADY)  237 mL Oral TID BM   finasteride  5 mg Oral Daily   lidocaine  1 patch Transdermal Q24H   melatonin  5 mg Oral QHS   metoprolol tartrate  12.5 mg Oral BID   midodrine  10 mg Oral TID WC   multivitamin  1 tablet Oral QHS   pantoprazole (PROTONIX) IV  40 mg Intravenous Q12H   rOPINIRole  1 mg Oral QHS   sevelamer carbonate  1,600 mg Oral Once per day on Sun Mon Wed Fri   sevelamer carbonate  1,600 mg Oral BID WC   sodium chloride flush  10 mL Intravenous Q12H   vitamin B-12  1,000 mcg Oral Daily   Continuous Infusions:  DAPTOmycin (CUBICIN)  IV     PRN Meds: ALPRAZolam, HYDROcodone-acetaminophen, nitroGLYCERIN   Vital Signs    Vitals:   03/02/21 1818 03/02/21 1942 03/03/21 0418 03/03/21 0425  BP: 111/74 103/61 128/74   Pulse: (!) 109 66 (!) 111 (!) 105  Resp: 19 16 20    Temp: 97.6 F (36.4 C) 97.8 F (36.6 C) 97.8 F (36.6 C)   TempSrc: Oral Oral    SpO2: 95% 100% 100%   Weight:      Height:        Intake/Output Summary (Last 24 hours) at 03/03/2021 0758 Last data filed at 03/02/2021 1421 Gross per 24 hour  Intake 300 ml  Output 1501 ml  Net -1201 ml    Filed Weights   03/02/21 0500 03/02/21 1026 03/02/21 1433  Weight: 104.5 kg 106.1 kg 105.7 kg    Telemetry    A. fib, 70s to 80s bpm - Personally Reviewed  ECG    No new tracings - Personally Reviewed  Physical Exam   GEN: No acute distress.   Neck: No JVD. Cardiac: IRIR, II/VI systolic murmurs RUSB, no rubs, or gallops.   Respiratory: Clear to auscultation bilaterally.  GI: Soft, nontender, non-distended.   MS: No stump edema with bilateral lower extremity amputations noted. Neuro:  Alert and oriented x 3; Nonfocal.  Psych: Normal affect.  Labs    Chemistry Recent Labs  Lab 02/26/21 0520 02/27/21 0630 02/28/21 0615 03/01/21 1056 03/02/21 0556 03/03/21 0430  NA 135 134*   < > 132* 132* 137  K 4.7 5.6*   < > 5.4* 6.1* 4.6  CL 99 97*   < > 97* 95* 98  CO2 28 27   < > 26 24 28   GLUCOSE 91 90   < > 109* 83 96  BUN 35* 50*   < > 44* 50* 31*  CREATININE 3.47* 4.24*   < > 4.56* 4.86* 3.53*  CALCIUM 8.4* 8.6*   < > 8.6* 9.2 8.9  PROT  --  5.5*  --   --   --   --   ALBUMIN 2.3* 2.5*  --   --   --   --   AST  --  15  --   --   --   --  ALT  --  11  --   --   --   --   ALKPHOS  --  100  --   --   --   --   BILITOT  --  1.3*  --   --   --   --   GFRNONAA 18* 14*   < > 13* 12* 17*  ANIONGAP 8 10   < > 9 13 11    < > = values in this interval not displayed.      Hematology Recent Labs  Lab 03/01/21 1056 03/02/21 0556 03/03/21 0430  WBC 4.8 6.0 4.6  RBC 2.74* 2.91* 2.82*  HGB 8.6* 9.2* 8.9*  HCT 27.6* 29.3* 29.0*  MCV 100.7* 100.7* 102.8*  MCH 31.4 31.6 31.6  MCHC 31.2 31.4 30.7  RDW 21.1* 21.0* 21.5*  PLT 123* 127* 129*     Cardiac EnzymesNo results for input(s): TROPONINI in the last 168 hours. No results for input(s): TROPIPOC in the last 168 hours.   BNPNo results for input(s): BNP, PROBNP in the last 168 hours.   DDimer No results for input(s): DDIMER in the last 168 hours.   Radiology    No results found.  Cardiac Studies   2D echo 02/22/2021: 1. Left ventricular ejection fraction, by estimation, is 40 to 45%. The  left ventricle has mildly decreased function. Left ventricular endocardial  border not optimally defined to evaluate regional wall motion. Left  ventricular diastolic parameters are  indeterminate.   2. Right ventricular systolic function is normal. The right  ventricular  size is normal. Tricuspid regurgitation signal is inadequate for assessing  PA pressure.   3. Left atrial size was mildly dilated.   4. A small pericardial effusion is present. The pericardial effusion is  posterior to the left ventricle. Moderate pleural effusion in the left  lateral region.   5. The mitral valve is normal in structure. Mild mitral valve  regurgitation. No evidence of mitral stenosis.   6. The aortic valve is normal in structure. Aortic valve regurgitation is  not visualized. Aortic valve sclerosis/calcification is present, without  any evidence of aortic stenosis.   7. Moderately dilated pulmonary artery.   8. The inferior vena cava is dilated in size with <50% respiratory  variability, suggesting right atrial pressure of 15 mmHg. __________  Limited echo 09/28/2020: 1. Left ventricular ejection fraction, by estimation, is 45 to 50%. The  left ventricle has mildly decreased function. Left ventricular endocardial  border not optimally defined to evaluate regional wall motion. There is  mild left ventricular hypertrophy.   2. Right ventricular systolic function is moderately reduced. The right  ventricular size is normal. Mildly increased right ventricular wall  thickness. There is moderately elevated pulmonary artery systolic  pressure.   3. The mitral valve is abnormal. Trivial mitral valve regurgitation.   4. The aortic valve is tricuspid. There is mild thickening of the aortic  valve.   5. The inferior vena cava is dilated in size with <50% respiratory  variability, suggesting right atrial pressure of 15 mmHg.  __________  2D echo 09/27/2020: 1. Poor acoustic windows Endocardium is not well seen I would recomm  limited echo with Definity to help define LVEF and regional wall motion. .  There is mild left ventricular hypertrophy. Left ventricular diastolic  parameters are indeterminate.   2. RV is not seen well enough to evaluate function. . The  right  ventricular size is mildly enlarged.   3. Left atrial  size was severely dilated.   4. Right atrial size was severely dilated.   5. Mild mitral valve regurgitation.   6. Tricuspid valve regurgitation is mild to moderate.   7. The aortic valve is tricuspid. Aortic valve regurgitation is not  visualized. Mild aortic valve sclerosis is present, with no evidence of  aortic valve stenosis.  __________  LHC 09/2020:   Ost LAD to Prox LAD lesion is 50% stenosed.   2nd Mrg lesion is 99% stenosed.   A drug-eluting stent was successfully placed using a STENT RESOLUTE ONYX 2.5X15.   Post intervention, there is a 30% residual stenosis.   1.  Severe single-vessel coronary artery disease with 99% subtotal stenosis of the second obtuse marginal branch, treated with a 2.5 x 15 mm resolute Onyx DES.  30% residual stenosis at the case completion due to resistant/calcified lesion type even with high-pressure noncompliant balloon angioplasty. 2.  Mild to moderate nonobstructive proximal LAD stenosis 3.  Mild nonobstructive RCA stenosis  Recommendations: Aggressive medical therapy, as long as no bleeding complications arise, would resume apixaban tomorrow, aspirin 81 mg daily x30 days, and clopidogrel 75 mg daily x minimum 6 months. __________   2D echo 09/2020: 1. Left ventricular ejection fraction, by estimation, is 60 to 65%. The  left ventricle has normal function. The left ventricle has no regional  wall motion abnormalities. Left ventricular diastolic parameters are  indeterminate.   2. Right ventricular systolic function is normal. The right ventricular  size is normal. Tricuspid regurgitation signal is inadequate for assessing  PA pressure.   3. Left atrial size was moderately dilated.   4. Right atrial size was moderately dilated.   5. Tricuspid valve regurgitation is mild to moderate.   6. The mitral valve is normal in structure. Mild mitral valve  regurgitation.   7. Rhythym is atrial  fibrillation   Patient Profile     75 y.o. 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 fib on Eliquis, ESRD on HD, hypotension, DM2, anemia of chronic disease, morbid obesity, and demand ischemia who was recently admitted in late 01/2021 with choledocholithiasis status post ERCP and stenting to the CBD on 12/7 complicated by GNR bacteremia who was readmitted on 12/17 with AMS felt to be related to toxic metabolic encephalopathy secondary to possible infection including cellulitis of the left hip and thigh as well as an abscess of the right hip status post draining with admission complicated by melena and acute on chronic anemia of chronic disease requiring 1 unit of PRBC on 12/24.  Assessment & Plan    1.  Melena with acute on chronic anemia of chronic disease: -BM yesterday with no hematochezia or melena -Status post 1 unit of PRBC on 12/24 -Hemoglobin stable on low-dose apixaban -Continue to monitor Hgb, if this remains stable, in the outpatient setting, could consider further challenge of [place] with titration of apixaban to 5 mg twice daily with continued close monitoring of Hgb -No invasive procedures planned given his comorbid conditions  2.  CAD involving the native coronary arteries with elevated high-sensitivity troponin: -Never with chest pain -Chronic stable dyspnea -High-sensitivity troponin 2463, felt to be elevated in the setting of demand ischemia with known CAD, ESRD requiring HD, acute on chronic anemia of chronic disease in the context of GI bleeding, and possible infection -No longer on Plavix given the GI bleeding issues and thrombocytopenia with most recent stenting being greater  than 6 months prior -Apixaban as outlined above -PTA Lipitor   3.  Permanent A. fib: -Ventricular rate is well controlled -Continue Lopressor -CHA2DS2-VASc at least 5 -Tolerating low-dose apixaban as outlined above with  recommendation to consider escalation to 5 mg twice daily down the road if Hgb remains stable  4.  HFmrEF: -Volume management per HD -Lopressor as outlined above -Relative hypotension requiring midodrine precludes escalation of GDMT at this time, escalate as/if able  5.  ESRD/hyperkalemia: -HD per nephrology      For questions or updates, please contact [facility] Please consult [email or web address] for contact info under Cardiology/STEMI.    Signed, Christell Faith, PA-C [facility] Pager: [phone] 03/03/2021, 7:58 AM

## 2021-03-03 NOTE — Progress Notes (Incomplete)
Date of Admission:  02/20/2021     ID: Antonio Phillips is a 75 y.o. male  Principal Problem:   Acute metabolic encephalopathy Active Problems:   NSTEMI, initial episode of care ([place])   Longstanding persistent atrial fibrillation (HCC)    Subjective: Pt says he still has left thigh pain  Medications:   apixaban  2.5 mg Oral BID   atorvastatin  40 mg Oral QHS   epoetin (EPOGEN/PROCRIT) injection  6,000 Units Intravenous Q T,Th,Sa-HD   feeding supplement (NEPRO CARB STEADY)  237 mL Oral TID BM   finasteride  5 mg Oral Daily   lidocaine  1 patch Transdermal Q24H   melatonin  5 mg Oral QHS   metoprolol tartrate  12.5 mg Oral BID   midodrine  10 mg Oral TID WC   multivitamin  1 tablet Oral QHS   pantoprazole (PROTONIX) IV  40 mg Intravenous Q12H   rOPINIRole  1 mg Oral QHS   sevelamer carbonate  1,600 mg Oral Once per day on Sun Mon Wed Fri   sevelamer carbonate  1,600 mg Oral BID WC   sodium chloride flush  10 mL Intravenous Q12H   vitamin B-12  1,000 mcg Oral Daily    Objective: Vital signs in last 24 hours: Temp:  [97.6 F (36.4 C)-98.1 F (36.7 C)] 97.7 F (36.5 C) (12/28 0808) Pulse Rate:  [66-111] 108 (12/28 0808) Resp:  [13-20] 18 (12/28 0808) BP: (103-128)/(61-80) 108/63 (12/28 0808) SpO2:  [95 %-100 %] 99 % (12/28 0808) Weight:  [105.7 kg] 105.7 kg (12/27 1433)  PHYSICAL EXAM:  General: Alert, cooperative, no distress, appears stated age. Lungs: Clear to auscultation bilaterally. No Wheezing or Rhonchi. No rales. Heart: Regular rate and rhythm, no murmur, rub or gallop. Abdomen: Soft, non-tender,not distended. Bowel sounds normal. No masses Extremities: left AKA-  stump wound covered with dressing/ thigh less swollen, indurated, less tender and less erythematous Rt BKA Sinus over the lateral thigh surgical scar Skin: No rashes or lesions. Or bruising Lymph: Cervical, supraclavicular normal. Neurologic: Grossly non-focal  Lab Results Recent Labs     03/02/21 0556 03/03/21 0430  WBC 6.0 4.6  HGB 9.2* 8.9*  HCT 29.3* 29.0*  NA 132* 137  K 6.1* 4.6  CL 95* 98  CO2 24 28  BUN 50* 31*  CREATININE 4.86* 3.53*     Microbiology:  02/26/21- US guided aspiration of the rt hip area fluid has staph warnerii and VRE  12/22 superfical culture of the sinus is staph epi   Assessment/Plan: ?75 yr male presented with lethargy from HD    Was thought to have sepsis- source was not clear- CT abdomen showed cystitis but no urine was sent and he is a dialysis patient who doe snot make much urine   Now he has a rt thigh surgical site sinus and need to r/o rt PJI- The CT showed a collection- IR aspiration doneshowed staph warneri and VRE- this needs to be treated- will change vanco to dapto and DC cefepime He has a prosthetic joint and that has to be removed Will discuss with Dr.Menz He also has left thigh swelling and erythema - imaging no abscess- looks like cellulitis/soft tissue infection  with wound on the stump.   Anemia- got blood transfusion  CHF  Afib- anticoagulation on hold because of melena But as not significant he has been restarted on eliquis Anasarca   Recent E.coli bacteremia due to choledocholithiasis, acute pancreatitis s/p ERCP and CBD stent  placement on 02/04/21. Repeat CT shows resolution of CBD dilatation   Rt BKA- stump fine Left AKA stump dry wound      ESRD on HD   CAD   Discussed the management with patient and care team

## 2021-03-03 NOTE — Progress Notes (Signed)
[facility]  ROUNDING NOTE   Subjective:   Patient seen sitting up in bed eating breakfast Tolerating meals Denies shortness of breath Dialysis received yesterday, tolerated well   Objective:  Vital signs in last 24 hours:  Temp:  [97.6 F (36.4 C)-98.1 F (36.7 C)] 97.7 F (36.5 C) (12/28 0808) Pulse Rate:  [41-117] 108 (12/28 0808) Resp:  [13-24] 18 (12/28 0808) BP: (98-128)/(61-80) 108/63 (12/28 0808) SpO2:  [95 %-100 %] 99 % (12/28 0808) Weight:  [105.7 kg] 105.7 kg (12/27 1433)  Weight change: 1.6 kg Filed Weights   03/02/21 0500 03/02/21 1026 03/02/21 1433  Weight: 104.5 kg 106.1 kg 105.7 kg    Intake/Output: I/O last 3 completed shifts: In: 300 [IV Piggyback:300] Out: 1501 [Other:1501]   Intake/Output this shift:  No intake/output data recorded.  Physical Exam: General: NAD, laying in bed  Head: Normocephalic, atraumatic. Moist oral mucosal membranes  Eyes: Anicteric  Lungs:  clear bilaterally, normal effort  Heart: Irregular  Abdomen:  Soft, obese  Extremities: +dependent edema.  Right BKA left AKA - clean dressings  Neurologic: Nonfocal, moving all four extremities  Skin: No lesions  Access: Left AVF    Basic Metabolic Panel: Recent Labs  Lab 02/26/21 0520 02/27/21 0630 02/28/21 0615 03/01/21 1056 03/02/21 0556 03/03/21 0430  NA 135 134* 132* 132* 132* 137  K 4.7 5.6* 5.2* 5.4* 6.1* 4.6  CL 99 97* 97* 97* 95* 98  CO2 28 27 27 26 24 28   GLUCOSE 91 90 118* 109* 83 96  BUN 35* 50* 32* 44* 50* 31*  CREATININE 3.47* 4.24* 3.58* 4.56* 4.86* 3.53*  CALCIUM 8.4* 8.6* 8.6* 8.6* 9.2 8.9  MG  --   --  1.8 1.8 1.9 1.8  PHOS 3.4  --   --   --   --   --      Liver Function Tests: Recent Labs  Lab 02/26/21 0520 02/27/21 0630  AST  --  15  ALT  --  11  ALKPHOS  --  100  BILITOT  --  1.3*  PROT  --  5.5*  ALBUMIN 2.3* 2.5*    No results for input(s): LIPASE, AMYLASE in the last 168 hours.  No results for input(s): AMMONIA in  the last 168 hours.   CBC: Recent Labs  Lab 02/26/21 0520 02/26/21 1528 02/27/21 0630 02/28/21 0615 03/01/21 1056 03/02/21 0556 03/03/21 0430  WBC 6.6 6.0 5.9 4.8 4.8 6.0 4.6  NEUTROABS 3.7 3.5 3.0  --   --   --   --   HGB 8.5* 8.1* 8.0* 8.7* 8.6* 9.2* 8.9*  HCT 27.3* 26.1* 26.3* 27.9* 27.6* 29.3* 29.0*  MCV 100.4* 100.0 100.8* 100.4* 100.7* 100.7* 102.8*  PLT 87* 91* 105* 113* 123* 127* 129*     Cardiac Enzymes: No results for input(s): CKTOTAL, CKMB, CKMBINDEX, TROPONINI in the last 168 hours.  BNP: Invalid input(s): POCBNP  CBG: No results for input(s): GLUCAP in the last 168 hours.   Microbiology: Results for orders placed or performed during the hospital encounter of 02/20/21  Resp Panel by RT-PCR (Flu A&B, Covid) Nasopharyngeal Swab     Status: None   Collection Time: 02/20/21  2:49 PM   Specimen: Nasopharyngeal Swab; Nasopharyngeal(NP) swabs in vial transport medium  Result Value Ref Range Status   SARS Coronavirus 2 by RT PCR NEGATIVE NEGATIVE Final    Comment: (NOTE) SARS-CoV-2 target nucleic acids are NOT DETECTED.  The SARS-CoV-2 RNA is generally detectable in upper respiratory  specimens during the acute phase of infection. The lowest concentration of SARS-CoV-2 viral copies this assay can detect is 138 copies/mL. A negative result does not preclude SARS-Cov-2 infection and should not be used as the sole basis for treatment or other patient management decisions. A negative result may occur with  improper specimen collection/handling, submission of specimen other than nasopharyngeal swab, presence of viral mutation(s) within the areas targeted by this assay, and inadequate number of viral copies(<138 copies/mL). A negative result must be combined with clinical observations, patient history, and epidemiological information. The expected result is Negative.  Fact Sheet for Patients:  EntrepreneurPulse.com.au  Fact Sheet for Healthcare  Providers:  IncredibleEmployment.be  This test is no t yet approved or cleared by the [place] FDA and  has been authorized for detection and/or diagnosis of SARS-CoV-2 by FDA under an Emergency Use Authorization (EUA). This EUA will remain  in effect (meaning this test can be used) for the duration of the COVID-19 declaration under Section 564(b)(1) of the Act, 21 U.S.C.section 360bbb-3(b)(1), unless the authorization is terminated  or revoked sooner.       Influenza A by PCR NEGATIVE NEGATIVE Final   Influenza B by PCR NEGATIVE NEGATIVE Final    Comment: (NOTE) The Xpert Xpress SARS-CoV-2/FLU/RSV plus assay is intended as an aid in the diagnosis of influenza from Nasopharyngeal swab specimens and should not be used as a sole basis for treatment. Nasal washings and aspirates are unacceptable for Xpert Xpress SARS-CoV-2/FLU/RSV testing.  Fact Sheet for Patients: EntrepreneurPulse.com.au  Fact Sheet for Healthcare Providers: IncredibleEmployment.be  This test is not yet approved or cleared by the [place] FDA and has been authorized for detection and/or diagnosis of SARS-CoV-2 by FDA under an Emergency Use Authorization (EUA). This EUA will remain in effect (meaning this test can be used) for the duration of the COVID-19 declaration under Section 564(b)(1) of the Act, 21 U.S.C. section 360bbb-3(b)(1), unless the authorization is terminated or revoked.  Performed at [facility], [address] [place], [place] [address]   Culture, blood (single)     Status: None   Collection Time: 02/20/21  9:05 PM   Specimen: BLOOD  Result Value Ref Range Status   Specimen Description BLOOD BLOOD RIGHT FOREARM  Final   Special Requests   Final    BOTTLES DRAWN AEROBIC AND ANAEROBIC Blood Culture results may not be optimal due to an excessive volume of blood received in culture bottles   Culture   Final    NO  GROWTH 5 DAYS Performed at [facility], [address] [place], [place] [address]    Report Status 02/25/2021 FINAL  Final  MRSA Next Gen by PCR, Nasal     Status: None   Collection Time: 02/21/21  7:39 AM   Specimen: Nasal Mucosa; Nasal Swab  Result Value Ref Range Status   MRSA by PCR Next Gen NOT DETECTED NOT DETECTED Final    Comment: (NOTE) The GeneXpert MRSA Assay (FDA approved for NASAL specimens only), is one component of a comprehensive MRSA colonization surveillance program. It is not intended to diagnose MRSA infection nor to guide or monitor treatment for MRSA infections. Test performance is not FDA approved in patients less than 51 years old. Performed at [facility], [place],  [address]   Aerobic Culture w Gram Stain (superficial specimen)     Status: None   Collection Time: 02/25/21  5:25 PM   Specimen: Joint, Hip; Wound  Result Value Ref Range Status   Specimen Description   Final    HIP RIGHT HIP WOUND Performed at [facility], [address] [place], [place] [address]    Special Requests   Final    NONE Performed at [facility], [address] [place], [place] [address]    Gram Stain   Final    FEW WBC PRESENT, PREDOMINANTLY MONONUCLEAR NO ORGANISMS SEEN Performed at [facility], [place] [address], [place], [place] [address]    Culture RARE STAPHYLOCOCCUS EPIDERMIDIS  Final   Report Status 02/28/2021 FINAL  Final   Organism ID, Bacteria STAPHYLOCOCCUS EPIDERMIDIS  Final      Susceptibility   Staphylococcus epidermidis - MIC*    CIPROFLOXACIN >=8 RESISTANT Resistant     ERYTHROMYCIN >=8 RESISTANT Resistant     GENTAMICIN 8 INTERMEDIATE Intermediate     OXACILLIN >=4 RESISTANT Resistant     TETRACYCLINE >=16 RESISTANT Resistant     VANCOMYCIN 1 SENSITIVE Sensitive     TRIMETH/SULFA 80 RESISTANT Resistant     CLINDAMYCIN >=8 RESISTANT Resistant     RIFAMPIN <=0.5 SENSITIVE  Sensitive     Inducible Clindamycin NEGATIVE Sensitive     * RARE STAPHYLOCOCCUS EPIDERMIDIS  Anaerobic culture w Gram Stain     Status: None   Collection Time: 02/26/21 12:54 PM   Specimen: Fluid; Abscess  Result Value Ref Range Status   Specimen Description   Final    FLUID Performed at [facility], [address] [place], [place] [address]    Special Requests   Final    NONE Performed at [facility], [address] [place], [place] [address]    Gram Stain   Final    RARE WBC PRESENT, PREDOMINANTLY MONONUCLEAR NO ORGANISMS SEEN    Culture   Final    NO ANAEROBES ISOLATED Performed at [facility], [place] [address], [place], [place] [address]    Report Status 03/03/2021 FINAL  Final  Aerobic Culture w Gram Stain (superficial specimen)     Status: None   Collection Time: 02/26/21  6:26 PM   Specimen: Wound  Result Value Ref Range Status   Specimen Description   Final    WOUND Performed at [facility], [address], [place], [place] [address]    Special Requests   Final    NONE Performed at [facility], [address] [place], [place] [address]    Gram Stain   Final    NO WBC SEEN RARE GRAM POSITIVE COCCI RARE GRAM POSITIVE RODS Performed at [facility], [place] [address], [place], [place] [address]    Culture   Final    RARE STAPHYLOCOCCUS WARNERI RARE ENTEROCOCCUS FAECIUM VANCOMYCIN RESISTANT ENTEROCOCCUS ISOLATED    Report Status 03/01/2021 FINAL  Final   Organism ID, Bacteria STAPHYLOCOCCUS WARNERI  Final   Organism ID, Bacteria ENTEROCOCCUS FAECIUM  Final      Susceptibility   Enterococcus faecium - MIC*    AMPICILLIN >=32 RESISTANT Resistant     VANCOMYCIN >=32 RESISTANT Resistant     GENTAMICIN SYNERGY SENSITIVE Sensitive     LINEZOLID 2 SENSITIVE Sensitive     * RARE ENTEROCOCCUS FAECIUM   Staphylococcus warneri - MIC*    CIPROFLOXACIN 4 RESISTANT Resistant     ERYTHROMYCIN >=8 RESISTANT  Resistant     GENTAMICIN <=0.5 SENSITIVE Sensitive     OXACILLIN >=4 RESISTANT Resistant     TETRACYCLINE >=16  RESISTANT Resistant     VANCOMYCIN 1 SENSITIVE Sensitive     TRIMETH/SULFA <=10 SENSITIVE Sensitive     CLINDAMYCIN RESISTANT Resistant     RIFAMPIN <=0.5 SENSITIVE Sensitive     Inducible Clindamycin POSITIVE Resistant     * RARE STAPHYLOCOCCUS WARNERI    Coagulation Studies: No results for input(s): LABPROT, INR in the last 72 hours.   Urinalysis: No results for input(s): COLORURINE, LABSPEC, PHURINE, GLUCOSEU, HGBUR, BILIRUBINUR, KETONESUR, PROTEINUR, UROBILINOGEN, NITRITE, LEUKOCYTESUR in the last 72 hours.  Invalid input(s): APPERANCEUR    Imaging: No results found.   Medications:    DAPTOmycin (CUBICIN)  IV 850 mg (03/03/21 0911)     apixaban  2.5 mg Oral BID   atorvastatin  40 mg Oral QHS   epoetin (EPOGEN/PROCRIT) injection  6,000 Units Intravenous Q T,Th,Sa-HD   feeding supplement (NEPRO CARB STEADY)  237 mL Oral TID BM   finasteride  5 mg Oral Daily   lidocaine  1 patch Transdermal Q24H   melatonin  5 mg Oral QHS   metoprolol tartrate  12.5 mg Oral BID   midodrine  10 mg Oral TID WC   multivitamin  1 tablet Oral QHS   pantoprazole (PROTONIX) IV  40 mg Intravenous Q12H   rOPINIRole  1 mg Oral QHS   sevelamer carbonate  1,600 mg Oral Once per day on Sun Mon Wed Fri   sevelamer carbonate  1,600 mg Oral BID WC   sodium chloride flush  10 mL Intravenous Q12H   vitamin B-12  1,000 mcg Oral Daily      Assessment/ Plan:  [name] is a 75 y.o. white male with end stage renal disease on hemodialysis, peripheral vascular disease, hypotension, diabetes mellitus type II, hyperlipidemia, right BKA and left AKA who is admitted to [facility] on 02/20/2021 for NSTEMI, initial episode of care [facility]) [I21.4] Fever, unspecified fever cause [R50.9] Altered mental status, unspecified altered mental status type [T01.77] Acute metabolic encephalopathy  [L39.03] Acute congestive heart failure, unspecified heart failure type ([place]) [I50.9]  [place] left AVF 96kg  End-stage renal disease on HD TTS. Received dialysis yesterday, UF goal 1.5L acheived.  Next treatment scheduled for Thursday.   2. Anemia of chronic kidney disease: now with GI bleed. With thrombocytopenia -continues to improve to 129  Lab Results  Component Value Date   HGB 8.9 (L) 03/03/2021   EPO 6000 units with HD treatment.  Status post PRBC transfusions this admission Appreciate GI input. Holding apixaban  3. Secondary Hyperparathyroidism: Lab Results  Component Value Date   CALCIUM 8.9 03/03/2021   PHOS 3.4 02/26/2021  Will continue to monitor bone minerals  Continue sevelamer with meals.   4.  Hypotension: BP 108/63, stable Continue midodrine.   5. Cellulitis: afebrile. No leukocytosis.  Now receiving Daptomycin Appreciate Infectious Disease input.     LOS: [place] 12/28/202212:46 PM

## 2021-03-04 ENCOUNTER — Inpatient Hospital Stay: Payer: Medicare Other | Attending: Nurse Practitioner

## 2021-03-04 ENCOUNTER — Telehealth: Payer: Self-pay | Admitting: Oncology

## 2021-03-04 ENCOUNTER — Inpatient Hospital Stay: Payer: Medicare Other | Admitting: Nurse Practitioner

## 2021-03-04 NOTE — Telephone Encounter (Signed)
Left VM with Linus Orn at [facility] where patient resides and requested a call back to get patient scheduled. He missed his hospital follow-up appointment.

## 2021-03-05 ENCOUNTER — Telehealth: Payer: Self-pay | Admitting: Oncology

## 2021-03-05 NOTE — Telephone Encounter (Signed)
Scheduler at [facility] returned my phone call about patient's missed appointment yesterday. She stated that patient left hospital AMA and arranged a ride to the [place] in [place] (where he is currently). I have been unable to reach patient directly. Will follow-up with [facility] next week to see if there has been any update.

## 2021-03-18 ENCOUNTER — Encounter: Payer: Self-pay | Admitting: Gastroenterology

## 2021-03-19 ENCOUNTER — Ambulatory Visit: Admission: RE | Admit: 2021-03-19 | Payer: Medicare Other | Source: Home / Self Care | Admitting: Gastroenterology

## 2021-03-19 ENCOUNTER — Encounter: Admission: RE | Payer: Self-pay | Source: Home / Self Care

## 2021-03-19 HISTORY — DX: Atherosclerotic heart disease of native coronary artery without angina pectoris: I25.10

## 2021-03-19 SURGERY — ERCP, WITH INTERVENTION IF INDICATED
Anesthesia: General

## 2021-03-30 ENCOUNTER — Other Ambulatory Visit
Admission: RE | Admit: 2021-03-30 | Discharge: 2021-03-30 | Disposition: A | Discharge status: Home / Self Care | Payer: Medicare Other | Source: Ambulatory Visit | Attending: Internal Medicine | Admitting: Internal Medicine

## 2021-03-30 DIAGNOSIS — T8459XA Infection and inflammatory reaction due to other internal joint prosthesis, initial encounter: Secondary | ICD-10-CM | POA: Insufficient documentation

## 2021-03-30 DIAGNOSIS — Z792 Long term (current) use of antibiotics: Secondary | ICD-10-CM | POA: Insufficient documentation

## 2021-03-30 DIAGNOSIS — Y828 Other medical devices associated with adverse incidents: Secondary | ICD-10-CM | POA: Insufficient documentation

## 2021-03-30 LAB — CK: Total CK: 21 U/L — ABNORMAL LOW (ref 49–397)

## 2021-04-06 ENCOUNTER — Other Ambulatory Visit
Admission: RE | Admit: 2021-04-06 | Discharge: 2021-04-06 | Disposition: A | Discharge status: Home / Self Care | Payer: Medicare Other | Source: Ambulatory Visit | Attending: Internal Medicine | Admitting: Internal Medicine

## 2021-04-06 DIAGNOSIS — T8459XA Infection and inflammatory reaction due to other internal joint prosthesis, initial encounter: Secondary | ICD-10-CM | POA: Insufficient documentation

## 2021-04-06 DIAGNOSIS — Z792 Long term (current) use of antibiotics: Secondary | ICD-10-CM | POA: Diagnosis not present

## 2021-04-06 DIAGNOSIS — Y828 Other medical devices associated with adverse incidents: Secondary | ICD-10-CM | POA: Diagnosis not present

## 2021-04-06 LAB — CK: Total CK: 25 U/L — ABNORMAL LOW (ref 49–397)

## 2021-04-13 ENCOUNTER — Other Ambulatory Visit
Admission: RE | Admit: 2021-04-13 | Discharge: 2021-04-13 | Disposition: A | Discharge status: Home / Self Care | Payer: Medicare Other | Source: Ambulatory Visit | Attending: Internal Medicine | Admitting: Internal Medicine

## 2021-04-13 DIAGNOSIS — Y828 Other medical devices associated with adverse incidents: Secondary | ICD-10-CM | POA: Diagnosis not present

## 2021-04-13 DIAGNOSIS — Z792 Long term (current) use of antibiotics: Secondary | ICD-10-CM | POA: Diagnosis not present

## 2021-04-13 DIAGNOSIS — T8459XA Infection and inflammatory reaction due to other internal joint prosthesis, initial encounter: Secondary | ICD-10-CM | POA: Insufficient documentation

## 2021-04-13 LAB — CK: Total CK: 20 U/L — ABNORMAL LOW (ref 49–397)

## 2021-04-29 NOTE — Progress Notes (Unsigned)
Date:  04/29/2021   ID:  Antonio Phillips, DOB 1945-04-17, MRN [id number]  Patient Location:  [place] [address]   Provider location:   [facility], [place] office  PCP:  Marsh Dolly, MD  Cardiologist:  [facility]  No chief complaint on file.   History of Present Illness:    Antonio Phillips is a 76 y.o. male who presents via audio/video conferencing for a telehealth visit today.   The patient does not symptoms concerning for COVID-19 infection (fever, chills, cough, or new SHORTNESS OF BREATH).    PMH of  PAD,  bilateral amputations,  permanent atrial fibrillation on anticoagulation,  end-stage renal disease on hemodialysis,  morbid obesity,  diabetes,  Rate and evaluated in the hospital with chest pain, elevated troponin  lives at [facility] facility, Today feels well Overall no complaints, denies any chest pain concerning for angina Prior cardiac history reviewed with him in detail By his report has had significant lower extremity arterial disease leading to bilateral amputations on dialysis since 2019, Prior evaluation by cardiology at [facility] for atrial fibrillation/SVT, no ischemic work-up at that time Echocardiogram at that time ejection fraction 55% Reports for many months has had chest discomfort sometimes quite severe, will periodically get nitroglycerin at hemodialysis for chest discomfort Is not very active secondary to amputations Has had some discomfort right side of his chest sometimes severe, seems to be escalating in frequency EMS called, given aspirin, nitro with no relief of pain, eventual relief of pain with morphine Chest CTA negative for PE or dissection Initial troponin greater than 300, trending downward to 296 Has been placed on heparin, denies any recurrence of his chest pain since that time  CATH 09/22/2020   Ost LAD to Prox LAD lesion is 50% stenosed.   2nd Mrg lesion is  99% stenosed.   A drug-eluting stent was successfully placed using a STENT RESOLUTE ONYX 2.5X15.   Post intervention, there is a 30% residual stenosis.   1.  Severe single-vessel coronary artery disease with 99% subtotal stenosis of the second obtuse marginal branch, treated with a 2.5 x 15 mm resolute Onyx DES.  30% residual stenosis at the case completion due to resistant/calcified lesion type even with high-pressure noncompliant balloon angioplasty. 2.  Mild to moderate nonobstructive proximal LAD stenosis 3.  Mild nonobstructive RCA stenosis  ECHO 09/28/20  1. Left ventricular ejection fraction, by estimation, is 45 to 50%. The  left ventricle has mildly decreased function. Left ventricular endocardial  border not optimally defined to evaluate regional wall motion. There is  mild left ventricular hypertrophy.   2. Right ventricular systolic function is moderately reduced. The right  ventricular size is normal. Mildly increased right ventricular wall  thickness. There is moderately elevated pulmonary artery systolic  pressure.   3. The mitral valve is abnormal. Trivial mitral valve regurgitation.   4. The aortic valve is tricuspid. There is mild thickening of the aortic  valve.   5. The inferior vena cava is dilated in size with <50% respiratory  variability, suggesting right atrial pressure of 15 mmHg.    Past Medical History:  Diagnosis Date   Coronary artery disease    Demand ischemia ([place])    a. 11/2017 elev Trop in setting of AFib/SVT-->Echo [facility]): EF>55%. Mild LVH. Nl RV fxn.   Diabetes ([place])    a. Pt states that he has no hx of diabetes--A1c 5.5  11/2018.   ESRD (end stage renal disease) ([place])    a. On HD since 2019.   Hyperlipidemia    Hypotension    a. On midodrine.   Morbid obesity ([place])    PAD (peripheral artery disease) ([place])    a. s/p L AKA and R BKA.   Permanent atrial fibrillation ([place])    a. Dx 2019. CHA2DS2VASc = 3-4-->Eliquis.   Past Surgical History:   Procedure Laterality Date   AV FISTULA REPAIR     BELOW KNEE LEG AMPUTATION Bilateral    CHOLECYSTECTOMY     ERCP N/A 02/04/2021   Procedure: ENDOSCOPIC RETROGRADE CHOLANGIOPANCREATOGRAPHY (ERCP);  Surgeon: Lucilla Lame, MD;  Location: [facility];  Service: Endoscopy;  Laterality: N/A;   LEFT HEART CATH AND CORONARY ANGIOGRAPHY N/A 09/22/2020   Procedure: LEFT HEART CATH AND CORONARY ANGIOGRAPHY;  Surgeon: Sherren Mocha, MD;  Location: [facility];  Service: Cardiovascular;  Laterality: N/A;   TOTAL HIP ARTHROPLASTY Bilateral      Allergies:   Simvastatin   Social History   Tobacco Use   Smoking status: Never   Smokeless tobacco: Never  Vaping Use   Vaping Use: Never used  Substance Use Topics   Alcohol use: Never   Drug use: Never     Current Outpatient Medications on File Prior to Visit  Medication Sig Dispense Refill   acetaminophen (TYLENOL) 325 MG tablet Take 2 tablets (650 mg total) by mouth every 6 (six) hours as needed for mild pain or moderate pain.     apixaban (ELIQUIS) 5 MG TABS tablet Take 1 tablet (5 mg total) by mouth 2 (two) times daily. 60 tablet 0   atorvastatin (LIPITOR) 40 MG tablet Take 40 mg by mouth at bedtime.     Carboxymethylcellulose Sod PF 0.5 % SOLN Place 1 drop into both eyes 3 (three) times daily as needed (dry eyes).     clopidogrel (PLAVIX) 75 MG tablet Take 1 tablet (75 mg total) by mouth daily with breakfast. 30 tablet 0   dextromethorphan-guaiFENesin (MUCINEX DM) 30-600 MG 12hr tablet Take 1 tablet by mouth 2 (two) times daily.     diclofenac Sodium (VOLTAREN) 1 % GEL Apply 2 g topically in the morning. (Apply to right stump)     finasteride (PROSCAR) 5 MG tablet Take 5 mg by mouth daily.     HYDROcodone-acetaminophen (NORCO/VICODIN) 5-325 MG tablet Take 1 tablet by mouth every 6 (six) hours as needed for severe pain. 10 tablet 0   lactulose, encephalopathy, (CHRONULAC) 10 GM/15ML SOLN Take 10 g by mouth 2 (two) times daily as  needed (mild constipation).     loperamide (IMODIUM A-D) 2 MG tablet Take 2 tablets (4 mg total) by mouth 4 (four) times daily as needed for diarrhea or loose stools. 30 tablet 0   loratadine (CLARITIN) 10 MG tablet Take 10 mg by mouth See admin instructions. Take 10 mg by mouth daily on Monday, Wednesday, Friday and Sunday     metoprolol tartrate (LOPRESSOR) 25 MG tablet Take 0.5 tablets (12.5 mg total) by mouth 2 (two) times daily.     midodrine (PROAMATINE) 10 MG tablet Take 1 tablet (10 mg total) by mouth 3 (three) times daily with meals.     pregabalin (LYRICA) 100 MG capsule Take 1 capsule (100 mg total) by mouth 2 (two) times daily.     rOPINIRole (REQUIP) 1 MG tablet Take 1 mg by mouth at bedtime.     sevelamer carbonate (RENVELA) 800 MG tablet  Take 1,600 mg by mouth See admin instructions. Take 2 tablets (1600mg ) by mouth three times daily with meals on Monday, Wednesday, Friday and Sunday     sevelamer carbonate (RENVELA) 800 MG tablet Take 800 mg by mouth See admin instructions. Take 2 tablets (1600mg ) by mouth twice daily with meals (breakfast and dinner) on Tuesday, Thursday and Saturday     sodium hypochlorite (DAKIN'S 1/4 STRENGTH) 0.125 % SOLN Apply 1 application topically See admin instructions. Use as directed after cleansing left stump wound - use twice daily and as needed     vitamin B-12 (CYANOCOBALAMIN) 1000 MCG tablet Take 1,000 mcg by mouth daily.     No current facility-administered medications on file prior to visit.     Family Hx: The patient's family history is not on file.  ROS:   Please see the history of present illness.    Review of Systems  Constitutional: Negative.   HENT: Negative.    Respiratory: Negative.    Cardiovascular: Negative.   Gastrointestinal: Negative.   Musculoskeletal: Negative.   Neurological: Negative.   Psychiatric/Behavioral: Negative.    All other systems reviewed and are negative.    Labs/Other Tests and Data Reviewed:     Recent Labs: 09/20/2020: TSH 3.605 02/20/2021: B Natriuretic Peptide >4,500.0 02/27/2021: ALT 11 03/03/2021: BUN 31; Creatinine, Ser 3.53; Hemoglobin 8.9; Magnesium 1.8; Platelets 129; Potassium 4.6; Sodium 137   Recent Lipid Panel Lab Results  Component Value Date/Time   CHOL 58 09/23/2020 04:13 AM   TRIG 63 09/23/2020 04:13 AM   HDL 23 (L) 09/23/2020 04:13 AM   CHOLHDL 2.5 09/23/2020 04:13 AM   LDLCALC 22 09/23/2020 04:13 AM    Wt Readings from Last 3 Encounters:  03/02/21 233 lb 0.4 oz (105.7 kg)  02/09/21 246 lb 7.6 oz (111.8 kg)  10/09/20 215 lb 1 oz (97.6 kg)     Exam:    Vital Signs: Vital signs may also be detailed in the HPI There were no vitals taken for this visit.  Wt Readings from Last 3 Encounters:  03/02/21 233 lb 0.4 oz (105.7 kg)  02/09/21 246 lb 7.6 oz (111.8 kg)  10/09/20 215 lb 1 oz (97.6 kg)   Temp Readings from Last 3 Encounters:  03/03/21 (!) 97.5 F (36.4 C)  02/09/21 97.9 F (36.6 C) (Oral)  09/30/20 98.1 F (36.7 C) (Oral)   BP Readings from Last 3 Encounters:  03/03/21 121/73  02/09/21 109/73  10/09/20 124/76   Pulse Readings from Last 3 Encounters:  03/03/21 (!) 108  02/09/21 62  10/09/20 84     Well nourished, well developed male in no acute distress. Constitutional:  oriented to person, place, and time. No distress.    ASSESSMENT & PLAN:    Problem List Items Addressed This Visit       Cardiology Problems   Atrial fibrillation, chronic (HCC)   Chronic hypotension   PVD (peripheral vascular disease) ([place])   Other Visit Diagnoses     Coronary artery disease of native artery of native heart with stable angina pectoris ([place])    -  Primary   NSTEMI (non-ST elevated myocardial infarction) ([place])          CAD with stable angina Currently with no symptoms of angina. No further workup at this time. Continue current medication regimen. Stay on Plavix, not on aspirin as he is on Eliquis Continue statin,  beta-blocker  PAD Cholesterol at goal Continue Lipitor, Plavix, Eliquis  Atrial fibrillation, permanent Rate controlled  on carvedilol, on Eliquis, no bleeding   COVID-19 Education: The signs and symptoms of COVID-19 were discussed with the patient and how to seek care for testing (follow up with PCP or arrange E-visit).  The importance of social distancing was discussed today.  Patient Risk:   After full review of this patients clinical status, I feel that they are at least moderate risk at this time.  Time:   Extensive cardiac records reviewed with him in detail today Today, I have spent 25 minutes with the patient with telehealth technology discussing the cardiac and medical problems/diagnoses detailed above   Additional 10 min spent reviewing the chart prior to patient visit today   Medication Adjustments/Labs and Tests Ordered: Current medicines are reviewed at length with the patient today.  Concerns regarding medicines are outlined above.   Tests Ordered: No tests ordered   Medication Changes: No changes made   Signed, Ida Rogue, MD  [place] Office [address] [place] [address], [place], [place] [address]

## 2021-04-30 ENCOUNTER — Ambulatory Visit: Payer: Medicare Other | Admitting: Cardiovascular Disease

## 2021-04-30 DIAGNOSIS — I25118 Atherosclerotic heart disease of native coronary artery with other forms of angina pectoris: Secondary | ICD-10-CM

## 2021-04-30 DIAGNOSIS — I214 Non-ST elevation (NSTEMI) myocardial infarction: Secondary | ICD-10-CM

## 2021-04-30 DIAGNOSIS — I9589 Other hypotension: Secondary | ICD-10-CM

## 2021-04-30 DIAGNOSIS — I739 Peripheral vascular disease, unspecified: Secondary | ICD-10-CM

## 2021-04-30 DIAGNOSIS — I482 Chronic atrial fibrillation, unspecified: Secondary | ICD-10-CM

## 2021-06-05 ENCOUNTER — Emergency Department
Admission: EM | Admit: 2021-06-05 | Discharge: 2021-06-05 | Disposition: A | Discharge status: Other Acute Inpatient Hospital | Payer: No Typology Code available for payment source | Attending: Emergency Medicine | Admitting: Emergency Medicine

## 2021-06-05 ENCOUNTER — Other Ambulatory Visit: Payer: Self-pay

## 2021-06-05 ENCOUNTER — Emergency Department: Payer: No Typology Code available for payment source

## 2021-06-05 DIAGNOSIS — N186 End stage renal disease: Secondary | ICD-10-CM | POA: Insufficient documentation

## 2021-06-05 DIAGNOSIS — Z20822 Contact with and (suspected) exposure to covid-19: Secondary | ICD-10-CM | POA: Diagnosis not present

## 2021-06-05 DIAGNOSIS — R0602 Shortness of breath: Secondary | ICD-10-CM | POA: Diagnosis not present

## 2021-06-05 DIAGNOSIS — Z992 Dependence on renal dialysis: Secondary | ICD-10-CM | POA: Diagnosis not present

## 2021-06-05 DIAGNOSIS — K922 Gastrointestinal hemorrhage, unspecified: Secondary | ICD-10-CM | POA: Insufficient documentation

## 2021-06-05 DIAGNOSIS — J810 Acute pulmonary edema: Secondary | ICD-10-CM | POA: Diagnosis not present

## 2021-06-05 DIAGNOSIS — R000 Tachycardia, unspecified: Secondary | ICD-10-CM | POA: Diagnosis not present

## 2021-06-05 DIAGNOSIS — K625 Hemorrhage of anus and rectum: Secondary | ICD-10-CM | POA: Diagnosis present

## 2021-06-05 LAB — CBC WITH DIFFERENTIAL/PLATELET
Abs Immature Granulocytes: 0.01 10*3/uL (ref 0.00–0.07)
Basophils Absolute: 0 10*3/uL (ref 0.0–0.1)
Basophils Relative: 0 %
Eosinophils Absolute: 0 10*3/uL (ref 0.0–0.5)
Eosinophils Relative: 1 %
HCT: 22.4 % — ABNORMAL LOW (ref 39.0–52.0)
Hemoglobin: 6.7 g/dL — ABNORMAL LOW (ref 13.0–17.0)
Immature Granulocytes: 0 %
Lymphocytes Relative: 46 %
Lymphs Abs: 1.8 10*3/uL (ref 0.7–4.0)
MCH: 34.2 pg — ABNORMAL HIGH (ref 26.0–34.0)
MCHC: 29.9 g/dL — ABNORMAL LOW (ref 30.0–36.0)
MCV: 114.3 fL — ABNORMAL HIGH (ref 80.0–100.0)
Monocytes Absolute: 0.3 10*3/uL (ref 0.1–1.0)
Monocytes Relative: 9 %
Neutro Abs: 1.8 10*3/uL (ref 1.7–7.7)
Neutrophils Relative %: 44 %
Platelets: 65 10*3/uL — ABNORMAL LOW (ref 150–400)
RBC: 1.96 MIL/uL — ABNORMAL LOW (ref 4.22–5.81)
RDW: 19.9 % — ABNORMAL HIGH (ref 11.5–15.5)
WBC: 4 10*3/uL (ref 4.0–10.5)
nRBC: 0 % (ref 0.0–0.2)

## 2021-06-05 LAB — PROTIME-INR
INR: 2.2 — ABNORMAL HIGH (ref 0.8–1.2)
Prothrombin Time: 24.6 seconds — ABNORMAL HIGH (ref 11.4–15.2)

## 2021-06-05 LAB — BASIC METABOLIC PANEL
Anion gap: 15 (ref 5–15)
BUN: 39 mg/dL — ABNORMAL HIGH (ref 8–23)
CO2: 25 mmol/L (ref 22–32)
Calcium: 9.2 mg/dL (ref 8.9–10.3)
Chloride: 105 mmol/L (ref 98–111)
Creatinine, Ser: 3.93 mg/dL — ABNORMAL HIGH (ref 0.61–1.24)
GFR, Estimated: 15 mL/min — ABNORMAL LOW (ref >60)
Glucose, Bld: 129 mg/dL — ABNORMAL HIGH (ref 70–99)
Potassium: 5.1 mmol/L (ref 3.5–5.1)
Sodium: 145 mmol/L (ref 135–145)

## 2021-06-05 LAB — TYPE AND SCREEN
ABO/RH(D): O POS
Antibody Screen: NEGATIVE

## 2021-06-05 LAB — BRAIN NATRIURETIC PEPTIDE: B Natriuretic Peptide: 2807.3 pg/mL — ABNORMAL HIGH (ref 0.0–100.0)

## 2021-06-05 LAB — RESP PANEL BY RT-PCR (FLU A&B, COVID) ARPGX2
Influenza A by PCR: NEGATIVE
Influenza B by PCR: NEGATIVE
SARS Coronavirus 2 by RT PCR: NEGATIVE

## 2021-06-05 NOTE — ED Notes (Signed)
Pt is resting comfortably at this time.

## 2021-06-05 NOTE — ED Provider Notes (Signed)
? ?[facility] ?Provider Note ? ? ? Event Date/Time  ? First MD Initiated Contact with Patient 06/05/21 0040   ?  (approximate) ? ? ?History  ? ?Rectal Bleeding ? ? ?HPI ? ?Antonio Phillips is a 76 y.o. 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  He presents tonight for evaluation of rectal bleeding.  He reports that he is staying at rehab currently and he noticed dark stools within the last 24 hours.  He is not sure if it is been going on before but the nurse aides did not tell him if he was having any dark or bloody stools.  He is not having any abdominal pain.  He is more short of breath tonight than baseline, but he is due for dialysis in the morning.  He has not had any fevers.  He denies any chest pain and denies abdominal pain.  He has been given his medications recently and does not think he has missed any doses.  He does not remember a history of rectal bleeding. ?  ? ? ?Physical Exam  ? ?Triage Vital Signs: ?ED Triage Vitals  ?Enc Vitals Group  ?   BP 06/05/21 0038 112/69  ?   Pulse Rate 06/05/21 0038 (!) 107  ?   Resp 06/05/21 0038 18  ?   Temp 06/05/21 0038 97.8 ?F (36.6 ?C)  ?   Temp Source 06/05/21 0038 Oral  ?   SpO2 06/05/21 0038 100 %  ?   Weight 06/05/21 0039 105.7 kg (233 lb 0.4 oz)  ?   Height 06/05/21 0039 1.803 m (5\' 11" )  ?   Head Circumference --   ?   Peak Flow --   ?   Pain Score 06/05/21 0039 0  ?   Pain Loc --   ?   Pain Edu? --   ?   Excl. in [place]? --   ? ? ?Most recent vital signs: ?Vitals:  ? 06/05/21 0400 06/05/21 0500  ?BP: 101/66 100/68  ?Pulse: (!) 103 90  ?Resp:    ?Temp:    ?SpO2: 100% 100%  ? ? ? ?General: Awake, slight increased work of breathing  but no significant distress at this time, appears chronically ill. ?CV:  Normal heart sounds. ?Resp:  Slightly increased work of breathing, at his request I turned up his oxygen from 3 L to 4 L, but his oxygen saturation is staying in the upper 90s to 100%.  Clear breath sounds throughout, somewhat limited due to body habitus. ?Abd:  No distention.  No tenderness to palpation. ?Other:  Rectal exam is notable for but seems to be a combination of hematochezia and melena with dark stool but also some bright red blood. ? ? ?ED Results / Procedures / Treatments  ? ?Labs ?(all labs ordered are listed, but only abnormal results are displayed) ?Labs Reviewed  ?BRAIN NATRIURETIC PEPTIDE - Abnormal; Notable for the following components:  ?    Result Value  ? B Natriuretic Peptide 2,807.3 (*)   ? All other components within normal limits  ?BASIC METABOLIC PANEL - Abnormal; Notable for the following components:  ? Glucose, Bld 129 (*)   ? BUN 39 (*)   ?  Creatinine, Ser 3.93 (*)   ? GFR, Estimated 15 (*)   ? All other components within normal limits  ?CBC WITH DIFFERENTIAL/PLATELET - Abnormal; Notable for the following components:  ? RBC 1.96 (*)   ? Hemoglobin 6.7 (*)   ? HCT 22.4 (*)   ? MCV 114.3 (*)   ? MCH 34.2 (*)   ? MCHC 29.9 (*)   ? RDW 19.9 (*)   ? Platelets 65 (*)   ? All other components within normal limits  ?PROTIME-INR - Abnormal; Notable for the following components:  ? Prothrombin Time 24.6 (*)   ? INR 2.2 (*)   ? All other components within normal limits  ?RESP PANEL BY RT-PCR (FLU A&B, COVID) [id number]  ?TYPE AND SCREEN  ?TYPE AND SCREEN  ? ? ? ?EKG ? ?ED ECG REPORT ?IHinda Kehr, the attending physician, personally viewed and interpreted this ECG. ? ?Date: 06/05/2021 ?EKG Time: 1:50 AM ?Rate: 113 ?Rhythm: Sinus tachycardia ?QRS Axis: normal ?Intervals: Mildly prolonged QTc interval of 505 ms ?ST/T Wave abnormalities: Non-specific ST segment / T-wave changes, but no clear evidence of acute ischemia. ?Narrative  Interpretation: no definitive evidence of acute ischemia; does not meet STEMI criteria. ? ? ? ?RADIOLOGY ?I personally reviewed the patient's chest x-ray and I am concerned for mild interstitial edema.  The radiologist confirmed this interpretation but no other clear acute abnormality ? ? ? ?PROCEDURES: ? ?Critical Care performed: No ? ?.1-3 Lead EKG Interpretation ?Performed by: Hinda Kehr, MD ?Authorized by: Hinda Kehr, MD  ? ?  Interpretation: abnormal   ?  ECG rate:  105 ?  ECG rate assessment: tachycardic   ?  Rhythm: sinus tachycardia   ?  Ectopy: none   ?  Conduction: normal   ? ? ?MEDICATIONS ORDERED IN ED: ?Medications - No data to display ? ? ?IMPRESSION / MDM / ASSESSMENT AND PLAN / ED COURSE  ?I reviewed the triage vital signs and the nursing notes. ?             ?               ? ?Differential diagnosis includes, but is not limited to, diverticular bleed, neoplasm, AV malformation, pulmonary edema due to needing dialysis/volume overload, acute infection. ? ?The patient is on the cardiac monitor to evaluate for evidence of arrhythmia and/or significant heart rate changes. ? ?Patient's vital signs are acceptable although he has some mild tachycardia.  He is normotensive.  He has extensive chronic medical issues including the significant risk factor of chronic renal failure and hemodialysis.  He is also had extensive wound issues in the past although that does not seem to be the primary issue at this time.  He is due for dialysis in the morning and is reporting some increased shortness of breath. ? ?His physical exam confirms a substantial amount of blood per rectum.  This is in the setting of at least Plavix anticoagulation if not Eliquis.  I see Eliquis on his computer medical record but I could not find it on the printed MAR from his facility. ? ?Regardless, he is having a significant amount of blood loss and will likely require further evaluation.  He is requesting transfer to the [place].  I  explained that I will touch base with him after obtaining lab work.  I ordered BMP, CBC with differential, type and screen, pro time INR, and BNP. ? ?I ordered a one-view portable chest x-ray and it is consistent with  some mild developing interstitial edema which would explain his increased work of breathing.  He does not need BiPAP at this time but that could develop. ? ?He seems to be suffering from an acute issue of GI bleeding on top of an acute exacerbation of his chronic kidney failure leading to interstitial edema.  He will benefit from admission but it is a matter of where he will be admitted. ?  ? ?Clinical Course as of 06/05/21 0523  ?Sat Jun 05, 2021  ?9539 Basic metabolic panel(!) ?Basic metabolic panel without concerning electrolyte abnormalities, known chronic kidney disease. [CF]  ?0208 Protime-INR(!) ?Elevated INR at 2.2 and elevated prothrombin time of 24.6 [CF]  ?0226 I reviewed the patient's CBC.  His hemoglobin is dropped to 6.7 and he will likely need a transfusion during dialysis.  However given his develop interstitial edema I would not transfuse him separate from dialysis.  His BNP is elevated at 2800, but he has been extremely elevated in the past. ? ?I will begin the process of contacting the [place] for possible transfer. [CF]  ?0336 Submitted paperwork to [facility] for transfer [CF]  ?0356 Resp Panel by RT-PCR (Flu A&B, Covid) Nasopharyngeal Swab ?Negative respiratory viral panel [CF]  ?0450 asdf [CF]  ?0457 I consulted the ED physician at the [facility], Dr. Ricky Ala.  We discussed the case and he agreed with the plan for ED to ED transfer. [CF]  ?  ?Clinical Course User Index ?[CF] Hinda Kehr, MD  ? ? ? ?FINAL CLINICAL IMPRESSION(S) / ED DIAGNOSES  ? ?Final diagnoses:  ?Acute GI bleeding  ?Acute pulmonary edema (HCC)  ?ESRD on hemodialysis ([place])  ? ? ? ?Rx / DC Orders  ? ?ED Discharge Orders   ? ? None  ? ?  ? ? ? ?Note:  This document was prepared using Dragon voice recognition software and  may include unintentional dictation errors. ?  ?Hinda Kehr, MD ?06/05/21 [phone] ? ?

## 2021-06-05 NOTE — ED Notes (Signed)
EMTALA reviewed by this RN.  

## 2021-06-05 NOTE — ED Triage Notes (Signed)
Pt brought from [facility] rehab tonight via EMS for complaints of two episodes of rectal bleeding that started this Thursday, States it was dark in appearance. Denies pain at this time.  ?

## 2021-07-02 NOTE — Telephone Encounter (Signed)
Attempted to schedule.  

## 2021-07-13 NOTE — Telephone Encounter (Signed)
Attempted to schedule.  LMOV to call office.  ° °

## 2021-08-26 ENCOUNTER — Other Ambulatory Visit: Payer: Self-pay

## 2021-08-26 ENCOUNTER — Emergency Department
Admission: EM | Admit: 2021-08-26 | Discharge: 2021-08-26 | Disposition: A | Discharge status: Home / Self Care | Payer: No Typology Code available for payment source | Attending: Student in an Organized Health Care Education/Training Program | Admitting: Student in an Organized Health Care Education/Training Program

## 2021-08-26 ENCOUNTER — Encounter: Payer: Self-pay | Admitting: Intensive Care

## 2021-08-26 DIAGNOSIS — Z7901 Long term (current) use of anticoagulants: Secondary | ICD-10-CM | POA: Diagnosis not present

## 2021-08-26 DIAGNOSIS — R200 Anesthesia of skin: Secondary | ICD-10-CM | POA: Diagnosis present

## 2021-08-26 DIAGNOSIS — G5603 Carpal tunnel syndrome, bilateral upper limbs: Secondary | ICD-10-CM | POA: Insufficient documentation

## 2021-08-26 NOTE — ED Provider Notes (Cosign Needed)
[facility] EMERGENCY DEPARTMENT Provider Note   CSN: [id number] Arrival date & time: 08/26/21  1701     History  Chief Complaint  Patient presents with   Hand Pain    Antonio Phillips is a 76 y.o. male.  Presents to the emergency department for evaluation of bilateral hand numbness.  He has had 3 to 4 months of numbness and tingling in both hands in the median nerve distribution.  His symptoms are worse at nighttime, he finds himself laying his hands over the side of the bed shaking them to get relief.  He states he has had carpal tunnel braces in the past but is not currently not wearing them.  He denies any headache, neck pain, radicular symptoms in the upper arms.  His hands feel weak at times and has had some noticeable atrophy of the thenar muscle bilaterally.  HPI     Home Medications Prior to Admission medications   Medication Sig Start Date End Date Taking? Authorizing Provider  acetaminophen (TYLENOL) 325 MG tablet Take 2 tablets (650 mg total) by mouth every 6 (six) hours as needed for mild pain or moderate pain. 01/17/20   Loletha Grayer, MD  apixaban (ELIQUIS) 5 MG TABS tablet Take 1 tablet (5 mg total) by mouth 2 (two) times daily. 01/17/20   Loletha Grayer, MD  atorvastatin (LIPITOR) 40 MG tablet Take 40 mg by mouth at bedtime. 09/17/20   [provider]  Carboxymethylcellulose Sod PF 0.5 % SOLN Place 1 drop into both eyes 3 (three) times daily as needed (dry eyes).    [provider]  clopidogrel (PLAVIX) 75 MG tablet Take 1 tablet (75 mg total) by mouth daily with breakfast. 09/23/20   Loletha Grayer, MD  dextromethorphan-guaiFENesin [facility] DM) 30-600 MG 12hr tablet Take 1 tablet by mouth 2 (two) times daily. 02/09/21   Lorella Nimrod, MD  diclofenac Sodium (VOLTAREN) 1 % GEL Apply 2 g topically in the morning. (Apply to right stump)    [provider]  finasteride (PROSCAR) 5 MG tablet Take 5 mg by mouth daily.  03/13/19   [provider]  HYDROcodone-acetaminophen (NORCO/VICODIN) 5-325 MG tablet Take 1 tablet by mouth every 6 (six) hours as needed for severe pain. 09/23/20   Loletha Grayer, MD  lactulose, encephalopathy, (CHRONULAC) 10 GM/15ML SOLN Take 10 g by mouth 2 (two) times daily as needed (mild constipation).    [provider]  loperamide (IMODIUM A-D) 2 MG tablet Take 2 tablets (4 mg total) by mouth 4 (four) times daily as needed for diarrhea or loose stools. 09/09/19   Carrie Mew, MD  loratadine (CLARITIN) 10 MG tablet Take 10 mg by mouth See admin instructions. Take 10 mg by mouth daily on Monday, Wednesday, Friday and Sunday    [provider]  metoprolol tartrate (LOPRESSOR) 25 MG tablet Take 0.5 tablets (12.5 mg total) by mouth 2 (two) times daily. 02/09/21   Lorella Nimrod, MD  midodrine (PROAMATINE) 10 MG tablet Take 1 tablet (10 mg total) by mouth 3 (three) times daily with meals. 02/09/21   Lorella Nimrod, MD  pregabalin (LYRICA) 100 MG capsule Take 1 capsule (100 mg total) by mouth 2 (two) times daily. 02/09/21   Lorella Nimrod, MD  rOPINIRole (REQUIP) 1 MG tablet Take 1 mg by mouth at bedtime. 03/13/19   [provider]  sevelamer carbonate (RENVELA) 800 MG tablet Take 1,600 mg by mouth See admin instructions. Take 2 tablets (1600mg ) by mouth three times  daily with meals on Monday, Wednesday, Friday and Sunday    [provider]  sevelamer carbonate (RENVELA) 800 MG tablet Take 800 mg by mouth See admin instructions. Take 2 tablets (1600mg ) by mouth twice daily with meals (breakfast and dinner) on Tuesday, Thursday and Saturday    [provider]  sodium hypochlorite (DAKIN'S 1/4 STRENGTH) 0.125 % SOLN Apply 1 application topically See admin instructions. Use as directed after cleansing left stump wound - use twice daily and as needed 01/15/21   [provider]  vitamin B-12 (CYANOCOBALAMIN) 1000 MCG tablet Take 1,000 mcg by mouth  daily.    [provider]      Allergies    Simvastatin    Review of Systems   Review of Systems  Physical Exam Updated Vital Signs BP 91/65 (BP Location: Right Arm)   Pulse 98   Temp (!) 97.5 F (36.4 C) (Oral)   Resp 18   Ht 6' (1.829 m)   Wt 94.8 kg   SpO2 100%   BMI 28.35 kg/m  Physical Exam Constitutional:      Appearance: He is well-developed.  HENT:     Head: Normocephalic and atraumatic.  Eyes:     Conjunctiva/sclera: Conjunctivae normal.  Cardiovascular:     Rate and Rhythm: Normal rate.  Pulmonary:     Effort: Pulmonary effort is normal. No respiratory distress.  Musculoskeletal:        General: Normal range of motion.     Cervical back: Normal range of motion.     Comments: Bilateral hands with positive Tinel's and Phalen sign with left greater than right thenar atrophy.  No catching triggering or locking.  Minimal erythema to the tip of the left middle finger, patient states he has been treated with antibiotics at [facility] and erythema is much improved.  No swelling warmth or redness throughout the wrist.  He has full range of motion of the shoulders and elbows.  Normal neck range of motion with no discomfort  Skin:    General: Skin is warm.     Findings: No rash.  Neurological:     General: No focal deficit present.     Mental Status: He is alert and oriented to person, place, and time. Mental status is at baseline.  Psychiatric:        Behavior: Behavior normal.        Thought Content: Thought content normal.     ED Results / Procedures / Treatments   Labs (all labs ordered are listed, but only abnormal results are displayed) Labs Reviewed - No data to display  EKG None  Radiology No results found.  Procedures Procedures    Medications Ordered in ED Medications - No data to display  ED Course/ Medical Decision Making/ A&P                           Medical Decision Making  76 year old male with bilateral hand numbness  and tingling in the median nerve distribution with positive Tinel's and Phalen sign consistent with carpal tunnel syndrome.  He has had symptoms for several months, has some mild thenar atrophy bilaterally.  Has had carpal tunnel braces in the past but has not been wearing them.  Recommend he start wearing carpal tunnel braces day and nighttime.  He states he is going to call the [place] tomorrow to schedule follow-up appointment, is not interested in following up with orthopedics here would  like to follow-up at the [place].  He understands signs symptoms return to the ER for. Final Clinical Impression(s) / ED Diagnoses Final diagnoses:  Bilateral carpal tunnel syndrome    Rx / DC Orders ED Discharge Orders     None         Duanne Guess, PA-C 08/26/21 2057

## 2021-08-26 NOTE — ED Notes (Addendum)
Patient is bilateral BKA. Dialysis patient, access on left arm

## 2021-08-26 NOTE — Discharge Instructions (Signed)
Please wear carpal tunnel braces during the day and nighttime to prevent irritation of the median nerve.  Call [facility] to schedule follow-up appointment with orthopedic surgeon to discuss carpal tunnel release or carpal tunnel injections.

## 2021-08-26 NOTE — ED Provider Triage Note (Signed)
Emergency Medicine Provider Triage Evaluation Note  Antonio Phillips , a 76 y.o. male  was evaluated in triage.  Pt complains of pain, numbness and tingling for the past few months. No known injury. Pain no worse today than usual.  Physical Exam  There were no vitals taken for this visit. Gen:   Awake, no distress   Resp:  Normal effort  MSK:   Moves extremities without difficulty  Other:    Medical Decision Making  Medically screening exam initiated at 5:21 PM.  Appropriate orders placed.  [name] was informed that the remainder of the evaluation will be completed by another provider, this initial triage assessment does not replace that evaluation, and the importance of remaining in the ED until their evaluation is complete.   Victorino Dike, FNP 08/26/21 1723

## 2021-08-26 NOTE — ED Triage Notes (Signed)
Pt comes into the ED via EMS from [facility], pt c/o burning pain and numbness in BL hands for the past 6 mo  126/76 HR61-102 a-fib 97% on 3L [place] , O2 all the time

## 2021-10-07 NOTE — Progress Notes (Deleted)
Date:  10/07/2021   ID:  Antonio Phillips, DOB [birth year], MRN [id number]  Patient Location:  [place] [address]   Provider location:   [facility], [place] office  PCP:  Marsh Dolly, MD  Cardiologist:  [facility]  No chief complaint on file.   History of Present Illness:    Antonio Phillips is a 76 y.o. male  PMH of  PAD,  bilateral amputations,  permanent atrial fibrillation on anticoagulation,  end-stage renal disease on hemodialysis,  morbid obesity,  diabetes,  Rate and evaluated in the hospital with chest pain, elevated troponin  Last seen by myself on video visit August 2022  lives at [place] facility, Today feels well Overall no complaints, denies any chest pain concerning for angina Prior cardiac history reviewed with him in detail By his report has had significant lower extremity arterial disease leading to bilateral amputations on dialysis since 2019, Prior evaluation by cardiology at [facility] for atrial fibrillation/SVT, no ischemic work-up at that time Echocardiogram at that time ejection fraction 55% Reports for many months has had chest discomfort sometimes quite severe, will periodically get nitroglycerin at hemodialysis for chest discomfort Is not very active secondary to amputations Has had some discomfort right side of his chest sometimes severe, seems to be escalating in frequency EMS called, given aspirin, nitro with no relief of pain, eventual relief of pain with morphine Chest CTA negative for PE or dissection Initial troponin greater than 300, trending downward to 296 Has been placed on heparin, denies any recurrence of his chest pain since that time  CATH 09/22/2020   Ost LAD to Prox LAD lesion is 50% stenosed.   2nd Mrg lesion is 99% stenosed.   A drug-eluting stent was successfully placed using a STENT RESOLUTE ONYX 2.5X15.   Post intervention, there is a 30% residual  stenosis.   1.  Severe single-vessel coronary artery disease with 99% subtotal stenosis of the second obtuse marginal branch, treated with a 2.5 x 15 mm resolute Onyx DES.  30% residual stenosis at the case completion due to resistant/calcified lesion type even with high-pressure noncompliant balloon angioplasty. 2.  Mild to moderate nonobstructive proximal LAD stenosis 3.  Mild nonobstructive RCA stenosis  ECHO 09/28/20  1. Left ventricular ejection fraction, by estimation, is 45 to 50%. The  left ventricle has mildly decreased function. Left ventricular endocardial  border not optimally defined to evaluate regional wall motion. There is  mild left ventricular hypertrophy.   2. Right ventricular systolic function is moderately reduced. The right  ventricular size is normal. Mildly increased right ventricular wall  thickness. There is moderately elevated pulmonary artery systolic  pressure.   3. The mitral valve is abnormal. Trivial mitral valve regurgitation.   4. The aortic valve is tricuspid. There is mild thickening of the aortic  valve.   5. The inferior vena cava is dilated in size with <50% respiratory  variability, suggesting right atrial pressure of 15 mmHg.    Past Medical History:  Diagnosis Date   Coronary artery disease    Demand ischemia ([place])    a. 11/2017 elev Trop in setting of AFib/SVT-->Echo [facility]): EF>55%. Mild LVH. Nl RV fxn.   Diabetes ([place])    a. Pt states that he has no hx of diabetes--A1c 5.5 11/2018.   ESRD (end stage renal disease) ([place])    a. On HD since 2019.   Hyperlipidemia  Hypotension    a. On midodrine.   Morbid obesity ([place])    PAD (peripheral artery disease) ([place])    a. s/p L AKA and R BKA.   Permanent atrial fibrillation ([place])    a. Dx 2019. CHA2DS2VASc = 3-4-->Eliquis.   Past Surgical History:  Procedure Laterality Date   AV FISTULA REPAIR     BELOW KNEE LEG AMPUTATION Bilateral    CHOLECYSTECTOMY     ERCP N/A 02/04/2021   Procedure:  ENDOSCOPIC RETROGRADE CHOLANGIOPANCREATOGRAPHY (ERCP);  Surgeon: Lucilla Lame, MD;  Location[facility] ENDOSCOPY;  Service: Endoscopy;  Laterality: N/A;   LEFT HEART CATH AND CORONARY ANGIOGRAPHY N/A 09/22/2020   Procedure: LEFT HEART CATH AND CORONARY ANGIOGRAPHY;  Surgeon: Sherren Mocha, MD;  Location: [place] CV LAB;  Service: Cardiovascular;  Laterality: N/A;   TOTAL HIP ARTHROPLASTY Bilateral      Allergies:   Simvastatin   Social History   Tobacco Use   Smoking status: Never   Smokeless tobacco: Never  Vaping Use   Vaping Use: Never used  Substance Use Topics   Alcohol use: Never   Drug use: Never     Current Outpatient Medications on File Prior to Visit  Medication Sig Dispense Refill   acetaminophen (TYLENOL) 325 MG tablet Take 2 tablets (650 mg total) by mouth every 6 (six) hours as needed for mild pain or moderate pain.     apixaban (ELIQUIS) 5 MG TABS tablet Take 1 tablet (5 mg total) by mouth 2 (two) times daily. 60 tablet 0   atorvastatin (LIPITOR) 40 MG tablet Take 40 mg by mouth at bedtime.     Carboxymethylcellulose Sod PF 0.5 % SOLN Place 1 drop into both eyes 3 (three) times daily as needed (dry eyes).     clopidogrel (PLAVIX) 75 MG tablet Take 1 tablet (75 mg total) by mouth daily with breakfast. 30 tablet 0   dextromethorphan-guaiFENesin (MUCINEX DM) 30-600 MG 12hr tablet Take 1 tablet by mouth 2 (two) times daily.     diclofenac Sodium (VOLTAREN) 1 % GEL Apply 2 g topically in the morning. (Apply to right stump)     finasteride (PROSCAR) 5 MG tablet Take 5 mg by mouth daily.     HYDROcodone-acetaminophen (NORCO/VICODIN) 5-325 MG tablet Take 1 tablet by mouth every 6 (six) hours as needed for severe pain. 10 tablet 0   lactulose, encephalopathy, (CHRONULAC) 10 GM/15ML SOLN Take 10 g by mouth 2 (two) times daily as needed (mild constipation).     loperamide (IMODIUM A-D) 2 MG tablet Take 2 tablets (4 mg total) by mouth 4 (four) times daily as needed for diarrhea  or loose stools. 30 tablet 0   loratadine (CLARITIN) 10 MG tablet Take 10 mg by mouth See admin instructions. Take 10 mg by mouth daily on Monday, Wednesday, Friday and Sunday     metoprolol tartrate (LOPRESSOR) 25 MG tablet Take 0.5 tablets (12.5 mg total) by mouth 2 (two) times daily.     midodrine (PROAMATINE) 10 MG tablet Take 1 tablet (10 mg total) by mouth 3 (three) times daily with meals.     pregabalin (LYRICA) 100 MG capsule Take 1 capsule (100 mg total) by mouth 2 (two) times daily.     rOPINIRole (REQUIP) 1 MG tablet Take 1 mg by mouth at bedtime.     sevelamer carbonate (RENVELA) 800 MG tablet Take 1,600 mg by mouth See admin instructions. Take 2 tablets (1600mg ) by mouth three times daily with meals on Monday, Wednesday, Friday  and Sunday     sevelamer carbonate (RENVELA) 800 MG tablet Take 800 mg by mouth See admin instructions. Take 2 tablets (1600mg ) by mouth twice daily with meals (breakfast and dinner) on Tuesday, Thursday and Saturday     sodium hypochlorite (DAKIN'S 1/4 STRENGTH) 0.125 % SOLN Apply 1 application topically See admin instructions. Use as directed after cleansing left stump wound - use twice daily and as needed     vitamin B-12 (CYANOCOBALAMIN) 1000 MCG tablet Take 1,000 mcg by mouth daily.     No current facility-administered medications on file prior to visit.     Family Hx: The patient's family history is not on file.  ROS:   Please see the history of present illness.    Review of Systems  Constitutional: Negative.   HENT: Negative.    Respiratory: Negative.    Cardiovascular: Negative.   Gastrointestinal: Negative.   Musculoskeletal: Negative.   Neurological: Negative.   Psychiatric/Behavioral: Negative.    All other systems reviewed and are negative.     Labs/Other Tests and Data Reviewed:    Recent Labs: 02/27/2021: ALT 11 03/03/2021: Magnesium 1.8 06/05/2021: B Natriuretic Peptide 2,807.3; BUN 39; Creatinine, Ser 3.93; Hemoglobin 6.7;  Platelets 65; Potassium 5.1; Sodium 145   Recent Lipid Panel Lab Results  Component Value Date/Time   CHOL 58 09/23/2020 04:13 AM   TRIG 63 09/23/2020 04:13 AM   HDL 23 (L) 09/23/2020 04:13 AM   CHOLHDL 2.5 09/23/2020 04:13 AM   LDLCALC 22 09/23/2020 04:13 AM    Wt Readings from Last 3 Encounters:  08/26/21 209 lb (94.8 kg)  06/05/21 233 lb 0.4 oz (105.7 kg)  03/02/21 233 lb 0.4 oz (105.7 kg)     Exam:    Vital Signs: Vital signs may also be detailed in the HPI There were no vitals taken for this visit.  Wt Readings from Last 3 Encounters:  08/26/21 209 lb (94.8 kg)  06/05/21 233 lb 0.4 oz (105.7 kg)  03/02/21 233 lb 0.4 oz (105.7 kg)   Temp Readings from Last 3 Encounters:  08/26/21 (!) 97.5 F (36.4 C) (Oral)  06/05/21 97.8 F (36.6 C)  03/03/21 (!) 97.5 F (36.4 C)   BP Readings from Last 3 Encounters:  08/26/21 102/63  06/05/21 112/75  03/03/21 121/73   Pulse Readings from Last 3 Encounters:  08/26/21 100  06/05/21 97  03/03/21 (!) 108     Well nourished, well developed male in no acute distress. Constitutional:  oriented to person, place, and time. No distress.    ASSESSMENT & PLAN:    Problem List Items Addressed This Visit       Cardiology Problems   Atrial fibrillation, chronic (HCC)   Chronic hypotension   PVD (peripheral vascular disease) ([place])   Other Visit Diagnoses     Coronary artery disease of native artery of native heart with stable angina pectoris ([place])    -  Primary   NSTEMI (non-ST elevated myocardial infarction) ([place])          CAD with stable angina Currently with no symptoms of angina. No further workup at this time. Continue current medication regimen. Stay on Plavix, not on aspirin as he is on Eliquis Continue statin, beta-blocker  PAD Cholesterol at goal Continue Lipitor, Plavix, Eliquis  Atrial fibrillation, permanent Rate controlled on carvedilol, on Eliquis, no bleeding   COVID-19 Education: The signs and  symptoms of COVID-19 were discussed with the patient and how to seek care for testing (follow  up with PCP or arrange E-visit).  The importance of social distancing was discussed today.  Patient Risk:   After full review of this patients clinical status, I feel that they are at least moderate risk at this time.  Time:   Extensive cardiac records reviewed with him in detail today Today, I have spent 25 minutes with the patient with telehealth technology discussing the cardiac and medical problems/diagnoses detailed above   Additional 10 min spent reviewing the chart prior to patient visit today   Medication Adjustments/Labs and Tests Ordered: Current medicines are reviewed at length with the patient today.  Concerns regarding medicines are outlined above.   Tests Ordered: No tests ordered   Medication Changes: No changes made   Signed, Ida Rogue, MD  [place] Office [address] [place] [address], [place], [place] [address]

## 2021-10-08 ENCOUNTER — Ambulatory Visit: Payer: Medicare Other | Admitting: Cardiovascular Disease

## 2021-10-08 DIAGNOSIS — I9589 Other hypotension: Secondary | ICD-10-CM

## 2021-10-08 DIAGNOSIS — I739 Peripheral vascular disease, unspecified: Secondary | ICD-10-CM

## 2021-10-08 DIAGNOSIS — I25118 Atherosclerotic heart disease of native coronary artery with other forms of angina pectoris: Secondary | ICD-10-CM

## 2021-10-08 DIAGNOSIS — I482 Chronic atrial fibrillation, unspecified: Secondary | ICD-10-CM

## 2021-10-08 DIAGNOSIS — I214 Non-ST elevation (NSTEMI) myocardial infarction: Secondary | ICD-10-CM

## 2021-11-10 DIAGNOSIS — L97119 Non-pressure chronic ulcer of right thigh with unspecified severity: Secondary | ICD-10-CM | POA: Diagnosis not present

## 2021-11-11 DIAGNOSIS — S71112D Laceration without foreign body, left thigh, subsequent encounter: Secondary | ICD-10-CM | POA: Diagnosis not present

## 2021-11-25 DIAGNOSIS — E785 Hyperlipidemia, unspecified: Secondary | ICD-10-CM | POA: Diagnosis not present

## 2021-11-25 DIAGNOSIS — F32A Depression, unspecified: Secondary | ICD-10-CM | POA: Diagnosis not present

## 2021-11-25 DIAGNOSIS — G4700 Insomnia, unspecified: Secondary | ICD-10-CM | POA: Diagnosis not present

## 2021-11-25 DIAGNOSIS — M7918 Myalgia, other site: Secondary | ICD-10-CM | POA: Diagnosis not present

## 2021-11-25 DIAGNOSIS — Z89511 Acquired absence of right leg below knee: Secondary | ICD-10-CM | POA: Diagnosis not present

## 2021-11-25 DIAGNOSIS — I2510 Atherosclerotic heart disease of native coronary artery without angina pectoris: Secondary | ICD-10-CM | POA: Diagnosis not present

## 2021-11-25 DIAGNOSIS — Z89612 Acquired absence of left leg above knee: Secondary | ICD-10-CM | POA: Diagnosis not present

## 2021-11-25 DIAGNOSIS — J9602 Acute respiratory failure with hypercapnia: Secondary | ICD-10-CM | POA: Diagnosis not present

## 2021-12-03 DIAGNOSIS — I4891 Unspecified atrial fibrillation: Secondary | ICD-10-CM | POA: Diagnosis not present

## 2021-12-03 DIAGNOSIS — Z992 Dependence on renal dialysis: Secondary | ICD-10-CM | POA: Diagnosis not present

## 2021-12-03 DIAGNOSIS — D649 Anemia, unspecified: Secondary | ICD-10-CM | POA: Diagnosis not present

## 2021-12-03 DIAGNOSIS — I2510 Atherosclerotic heart disease of native coronary artery without angina pectoris: Secondary | ICD-10-CM | POA: Diagnosis not present

## 2021-12-03 DIAGNOSIS — I739 Peripheral vascular disease, unspecified: Secondary | ICD-10-CM | POA: Diagnosis not present

## 2021-12-03 DIAGNOSIS — N186 End stage renal disease: Secondary | ICD-10-CM | POA: Diagnosis not present

## 2021-12-03 DIAGNOSIS — J449 Chronic obstructive pulmonary disease, unspecified: Secondary | ICD-10-CM | POA: Diagnosis not present

## 2021-12-03 DIAGNOSIS — K922 Gastrointestinal hemorrhage, unspecified: Secondary | ICD-10-CM | POA: Diagnosis not present

## 2021-12-04 ENCOUNTER — Emergency Department: Payer: No Typology Code available for payment source

## 2021-12-04 ENCOUNTER — Other Ambulatory Visit: Payer: Self-pay

## 2021-12-04 ENCOUNTER — Observation Stay: Payer: No Typology Code available for payment source

## 2021-12-04 ENCOUNTER — Encounter: Payer: Self-pay | Admitting: Emergency Medicine

## 2021-12-04 ENCOUNTER — Inpatient Hospital Stay
Admission: EM | Admit: 2021-12-04 | Discharge: 2021-12-07 | DRG: 091 | Disposition: A | Discharge status: Skilled Nursing Facility | Payer: No Typology Code available for payment source | Source: Skilled Nursing Facility | Attending: Internal Medicine | Admitting: Internal Medicine

## 2021-12-04 DIAGNOSIS — Z1152 Encounter for screening for COVID-19: Secondary | ICD-10-CM

## 2021-12-04 DIAGNOSIS — I959 Hypotension, unspecified: Secondary | ICD-10-CM | POA: Diagnosis not present

## 2021-12-04 DIAGNOSIS — R601 Generalized edema: Secondary | ICD-10-CM

## 2021-12-04 DIAGNOSIS — N2889 Other specified disorders of kidney and ureter: Secondary | ICD-10-CM | POA: Diagnosis not present

## 2021-12-04 DIAGNOSIS — R310 Gross hematuria: Secondary | ICD-10-CM

## 2021-12-04 DIAGNOSIS — L89311 Pressure ulcer of right buttock, stage 1: Secondary | ICD-10-CM | POA: Diagnosis present

## 2021-12-04 DIAGNOSIS — E872 Acidosis, unspecified: Secondary | ICD-10-CM | POA: Diagnosis present

## 2021-12-04 DIAGNOSIS — Z8546 Personal history of malignant neoplasm of prostate: Secondary | ICD-10-CM

## 2021-12-04 DIAGNOSIS — D638 Anemia in other chronic diseases classified elsewhere: Secondary | ICD-10-CM | POA: Diagnosis present

## 2021-12-04 DIAGNOSIS — G928 Other toxic encephalopathy: Secondary | ICD-10-CM | POA: Diagnosis not present

## 2021-12-04 DIAGNOSIS — I9589 Other hypotension: Secondary | ICD-10-CM | POA: Diagnosis present

## 2021-12-04 DIAGNOSIS — I5022 Chronic systolic (congestive) heart failure: Secondary | ICD-10-CM | POA: Diagnosis present

## 2021-12-04 DIAGNOSIS — Z992 Dependence on renal dialysis: Secondary | ICD-10-CM | POA: Diagnosis not present

## 2021-12-04 DIAGNOSIS — R188 Other ascites: Secondary | ICD-10-CM | POA: Diagnosis not present

## 2021-12-04 DIAGNOSIS — I2489 Other forms of acute ischemic heart disease: Secondary | ICD-10-CM | POA: Diagnosis present

## 2021-12-04 DIAGNOSIS — E1151 Type 2 diabetes mellitus with diabetic peripheral angiopathy without gangrene: Secondary | ICD-10-CM | POA: Diagnosis present

## 2021-12-04 DIAGNOSIS — N186 End stage renal disease: Secondary | ICD-10-CM | POA: Diagnosis not present

## 2021-12-04 DIAGNOSIS — I2510 Atherosclerotic heart disease of native coronary artery without angina pectoris: Secondary | ICD-10-CM | POA: Diagnosis present

## 2021-12-04 DIAGNOSIS — K5730 Diverticulosis of large intestine without perforation or abscess without bleeding: Secondary | ICD-10-CM | POA: Diagnosis not present

## 2021-12-04 DIAGNOSIS — D696 Thrombocytopenia, unspecified: Secondary | ICD-10-CM | POA: Diagnosis present

## 2021-12-04 DIAGNOSIS — E1122 Type 2 diabetes mellitus with diabetic chronic kidney disease: Secondary | ICD-10-CM | POA: Diagnosis present

## 2021-12-04 DIAGNOSIS — Z89511 Acquired absence of right leg below knee: Secondary | ICD-10-CM

## 2021-12-04 DIAGNOSIS — Z91158 Patient's noncompliance with renal dialysis for other reason: Secondary | ICD-10-CM

## 2021-12-04 DIAGNOSIS — I4821 Permanent atrial fibrillation: Secondary | ICD-10-CM | POA: Diagnosis present

## 2021-12-04 DIAGNOSIS — N2581 Secondary hyperparathyroidism of renal origin: Secondary | ICD-10-CM | POA: Diagnosis present

## 2021-12-04 DIAGNOSIS — Z9981 Dependence on supplemental oxygen: Secondary | ICD-10-CM

## 2021-12-04 DIAGNOSIS — R319 Hematuria, unspecified: Secondary | ICD-10-CM | POA: Clinically undetermined

## 2021-12-04 DIAGNOSIS — D631 Anemia in chronic kidney disease: Secondary | ICD-10-CM | POA: Diagnosis present

## 2021-12-04 DIAGNOSIS — Z66 Do not resuscitate: Secondary | ICD-10-CM | POA: Diagnosis present

## 2021-12-04 DIAGNOSIS — Z96643 Presence of artificial hip joint, bilateral: Secondary | ICD-10-CM | POA: Diagnosis present

## 2021-12-04 DIAGNOSIS — I482 Chronic atrial fibrillation, unspecified: Secondary | ICD-10-CM | POA: Diagnosis present

## 2021-12-04 DIAGNOSIS — J90 Pleural effusion, not elsewhere classified: Secondary | ICD-10-CM | POA: Diagnosis present

## 2021-12-04 DIAGNOSIS — Z6841 Body Mass Index (BMI) 40.0 and over, adult: Secondary | ICD-10-CM

## 2021-12-04 DIAGNOSIS — T426X5A Adverse effect of other antiepileptic and sedative-hypnotic drugs, initial encounter: Secondary | ICD-10-CM | POA: Diagnosis present

## 2021-12-04 DIAGNOSIS — G9341 Metabolic encephalopathy: Secondary | ICD-10-CM | POA: Diagnosis not present

## 2021-12-04 DIAGNOSIS — G9340 Encephalopathy, unspecified: Secondary | ICD-10-CM | POA: Diagnosis not present

## 2021-12-04 DIAGNOSIS — L89321 Pressure ulcer of left buttock, stage 1: Secondary | ICD-10-CM | POA: Diagnosis present

## 2021-12-04 DIAGNOSIS — K8020 Calculus of gallbladder without cholecystitis without obstruction: Secondary | ICD-10-CM | POA: Diagnosis not present

## 2021-12-04 DIAGNOSIS — S0291XA Unspecified fracture of skull, initial encounter for closed fracture: Secondary | ICD-10-CM | POA: Diagnosis not present

## 2021-12-04 DIAGNOSIS — E785 Hyperlipidemia, unspecified: Secondary | ICD-10-CM | POA: Diagnosis present

## 2021-12-04 DIAGNOSIS — Z7902 Long term (current) use of antithrombotics/antiplatelets: Secondary | ICD-10-CM

## 2021-12-04 DIAGNOSIS — R531 Weakness: Secondary | ICD-10-CM | POA: Diagnosis not present

## 2021-12-04 DIAGNOSIS — I132 Hypertensive heart and chronic kidney disease with heart failure and with stage 5 chronic kidney disease, or end stage renal disease: Secondary | ICD-10-CM | POA: Diagnosis present

## 2021-12-04 DIAGNOSIS — R079 Chest pain, unspecified: Secondary | ICD-10-CM | POA: Diagnosis not present

## 2021-12-04 DIAGNOSIS — Z888 Allergy status to other drugs, medicaments and biological substances status: Secondary | ICD-10-CM

## 2021-12-04 DIAGNOSIS — L8990 Pressure ulcer of unspecified site, unspecified stage: Secondary | ICD-10-CM | POA: Diagnosis present

## 2021-12-04 DIAGNOSIS — R4182 Altered mental status, unspecified: Secondary | ICD-10-CM | POA: Diagnosis not present

## 2021-12-04 DIAGNOSIS — Z79899 Other long term (current) drug therapy: Secondary | ICD-10-CM

## 2021-12-04 DIAGNOSIS — J9611 Chronic respiratory failure with hypoxia: Secondary | ICD-10-CM | POA: Diagnosis present

## 2021-12-04 DIAGNOSIS — Z7901 Long term (current) use of anticoagulants: Secondary | ICD-10-CM

## 2021-12-04 LAB — COMPREHENSIVE METABOLIC PANEL
ALT: 6 U/L (ref 0–44)
AST: 16 U/L (ref 15–41)
Albumin: 2.5 g/dL — ABNORMAL LOW (ref 3.5–5.0)
Alkaline Phosphatase: 56 U/L (ref 38–126)
Anion gap: 10 (ref 5–15)
BUN: 39 mg/dL — ABNORMAL HIGH (ref 8–23)
CO2: 17 mmol/L — ABNORMAL LOW (ref 22–32)
Calcium: 7.1 mg/dL — ABNORMAL LOW (ref 8.9–10.3)
Chloride: 114 mmol/L — ABNORMAL HIGH (ref 98–111)
Creatinine, Ser: 4.19 mg/dL — ABNORMAL HIGH (ref 0.61–1.24)
GFR, Estimated: 14 mL/min — ABNORMAL LOW (ref >60)
Glucose, Bld: 71 mg/dL (ref 70–99)
Potassium: 3.9 mmol/L (ref 3.5–5.1)
Sodium: 141 mmol/L (ref 135–145)
Total Bilirubin: 1.6 mg/dL — ABNORMAL HIGH (ref 0.3–1.2)
Total Protein: 4.5 g/dL — ABNORMAL LOW (ref 6.5–8.1)

## 2021-12-04 LAB — BLOOD GAS, VENOUS
Acid-base deficit: 14 mmol/L — ABNORMAL HIGH (ref 0.0–2.0)
Bicarbonate: 14.6 mmol/L — ABNORMAL LOW (ref 20.0–28.0)
O2 Saturation: 81.1 %
Patient temperature: 37
pCO2, Ven: 43 mmHg — ABNORMAL LOW (ref 44–60)
pH, Ven: 7.14 — CL (ref 7.25–7.43)
pO2, Ven: 53 mmHg — ABNORMAL HIGH (ref 32–45)

## 2021-12-04 LAB — CBC WITH DIFFERENTIAL/PLATELET
Abs Immature Granulocytes: 0.05 10*3/uL (ref 0.00–0.07)
Basophils Absolute: 0 10*3/uL (ref 0.0–0.1)
Basophils Relative: 0 %
Eosinophils Absolute: 0 10*3/uL (ref 0.0–0.5)
Eosinophils Relative: 1 %
HCT: 41.3 % (ref 39.0–52.0)
Hemoglobin: 11.8 g/dL — ABNORMAL LOW (ref 13.0–17.0)
Immature Granulocytes: 1 %
Lymphocytes Relative: 19 %
Lymphs Abs: 1.6 10*3/uL (ref 0.7–4.0)
MCH: 31.1 pg (ref 26.0–34.0)
MCHC: 28.6 g/dL — ABNORMAL LOW (ref 30.0–36.0)
MCV: 109 fL — ABNORMAL HIGH (ref 80.0–100.0)
Monocytes Absolute: 0.5 10*3/uL (ref 0.1–1.0)
Monocytes Relative: 6 %
Neutro Abs: 6.1 10*3/uL (ref 1.7–7.7)
Neutrophils Relative %: 73 %
Platelets: 85 10*3/uL — ABNORMAL LOW (ref 150–400)
RBC: 3.79 MIL/uL — ABNORMAL LOW (ref 4.22–5.81)
RDW: 19.9 % — ABNORMAL HIGH (ref 11.5–15.5)
WBC: 8.3 10*3/uL (ref 4.0–10.5)
nRBC: 0 % (ref 0.0–0.2)

## 2021-12-04 LAB — URINALYSIS, ROUTINE W REFLEX MICROSCOPIC
Bacteria, UA: NONE SEEN
RBC / HPF: >50 RBC/hpf — ABNORMAL HIGH (ref 0–5)
Specific Gravity, Urine: 1.012 (ref 1.005–1.030)
WBC, UA: >50 WBC/hpf — ABNORMAL HIGH (ref 0–5)

## 2021-12-04 LAB — LACTIC ACID, PLASMA: Lactic Acid, Venous: 1.4 mmol/L (ref 0.5–1.9)

## 2021-12-04 LAB — HEPATITIS C ANTIBODY: HCV Ab: NONREACTIVE

## 2021-12-04 LAB — BRAIN NATRIURETIC PEPTIDE: B Natriuretic Peptide: 1932.3 pg/mL — ABNORMAL HIGH (ref 0.0–100.0)

## 2021-12-04 LAB — HEPATITIS B SURFACE ANTIGEN: Hepatitis B Surface Ag: NONREACTIVE

## 2021-12-04 LAB — HEPATITIS B SURFACE ANTIBODY,QUALITATIVE: Hep B S Ab: NONREACTIVE

## 2021-12-04 LAB — SARS CORONAVIRUS 2 BY RT PCR: SARS Coronavirus 2 by RT PCR: NEGATIVE

## 2021-12-04 LAB — HEPATITIS B CORE ANTIBODY, TOTAL: Hep B Core Total Ab: NONREACTIVE

## 2021-12-04 MED ORDER — DICLOFENAC SODIUM 1 % EX GEL
2.0000 g | Freq: Every day | CUTANEOUS | Status: DC
  Administered 2021-12-05 – 2021-12-07 (×3): 2 g via TOPICAL
  Filled 2021-12-04: qty 100

## 2021-12-04 MED ORDER — ACETAMINOPHEN 325 MG PO TABS
650.0000 mg | ORAL_TABLET | Freq: Four times a day (QID) | ORAL | Status: DC | PRN
  Administered 2021-12-04 – 2021-12-06 (×5): 650 mg via ORAL
  Filled 2021-12-04 (×5): qty 2

## 2021-12-04 MED ORDER — HEPARIN SODIUM (PORCINE) 5000 UNIT/ML IJ SOLN
5000.0000 [IU] | Freq: Three times a day (TID) | INTRAMUSCULAR | Status: DC
  Administered 2021-12-04 – 2021-12-06 (×6): 5000 [IU] via SUBCUTANEOUS
  Filled 2021-12-04 (×8): qty 1

## 2021-12-04 MED ORDER — LIDOCAINE HCL (PF) 1 % IJ SOLN: 5.0000 mL | INTRAMUSCULAR | Status: DC | PRN

## 2021-12-04 MED ORDER — METOPROLOL TARTRATE 25 MG PO TABS
12.5000 mg | ORAL_TABLET | Freq: Two times a day (BID) | ORAL | Status: DC
  Administered 2021-12-05 – 2021-12-06 (×3): 12.5 mg via ORAL
  Filled 2021-12-04 (×4): qty 1

## 2021-12-04 MED ORDER — POLYETHYLENE GLYCOL 3350 17 G PO PACK
17.0000 g | PACK | Freq: Every day | ORAL | Status: DC | PRN
  Administered 2021-12-06: 17 g via ORAL
  Filled 2021-12-04: qty 1

## 2021-12-04 MED ORDER — VANCOMYCIN HCL 2000 MG/400ML IV SOLN
2000.0000 mg | Freq: Once | INTRAVENOUS | Status: AC
  Administered 2021-12-04: 2000 mg via INTRAVENOUS
  Filled 2021-12-04: qty 400

## 2021-12-04 MED ORDER — LIDOCAINE-PRILOCAINE 2.5-2.5 % EX CREA: 1.0000 | TOPICAL_CREAM | CUTANEOUS | Status: DC | PRN

## 2021-12-04 MED ORDER — SODIUM CHLORIDE 0.9 % IV BOLUS
500.0000 mL | Freq: Once | INTRAVENOUS | Status: AC
  Administered 2021-12-04: 500 mL via INTRAVENOUS

## 2021-12-04 MED ORDER — ALTEPLASE 2 MG IJ SOLR: 2.0000 mg | Freq: Once | INTRAMUSCULAR | Status: DC | PRN

## 2021-12-04 MED ORDER — SERTRALINE HCL 50 MG PO TABS
50.0000 mg | ORAL_TABLET | Freq: Every day | ORAL | Status: DC
  Administered 2021-12-04 – 2021-12-07 (×4): 50 mg via ORAL
  Filled 2021-12-04 (×4): qty 1

## 2021-12-04 MED ORDER — SODIUM CHLORIDE 0.9 % IV SOLN
2.0000 g | Freq: Once | INTRAVENOUS | Status: AC
  Administered 2021-12-04: 2 g via INTRAVENOUS
  Filled 2021-12-04: qty 12.5

## 2021-12-04 MED ORDER — HEPARIN SODIUM (PORCINE) 1000 UNIT/ML DIALYSIS: 1000.0000 [IU] | INTRAMUSCULAR | Status: DC | PRN

## 2021-12-04 MED ORDER — FOLIC ACID 1 MG PO TABS
1.0000 mg | ORAL_TABLET | Freq: Every day | ORAL | Status: DC
  Administered 2021-12-04 – 2021-12-07 (×4): 1 mg via ORAL
  Filled 2021-12-04 (×4): qty 1

## 2021-12-04 MED ORDER — MIDODRINE HCL 5 MG PO TABS
10.0000 mg | ORAL_TABLET | Freq: Once | ORAL | Status: AC
  Administered 2021-12-04: 10 mg via ORAL
  Filled 2021-12-04: qty 2

## 2021-12-04 MED ORDER — CHLORHEXIDINE GLUCONATE CLOTH 2 % EX PADS
6.0000 | MEDICATED_PAD | Freq: Every day | CUTANEOUS | Status: DC
  Administered 2021-12-06 – 2021-12-07 (×2): 6 via TOPICAL
  Filled 2021-12-04: qty 6

## 2021-12-04 MED ORDER — ACETAMINOPHEN 650 MG RE SUPP: 650.0000 mg | Freq: Four times a day (QID) | RECTAL | Status: DC | PRN

## 2021-12-04 MED ORDER — PENTAFLUOROPROP-TETRAFLUOROETH EX AERO: 1.0000 | INHALATION_SPRAY | CUTANEOUS | Status: DC | PRN

## 2021-12-04 MED ORDER — SODIUM CHLORIDE 0.9% FLUSH
3.0000 mL | Freq: Two times a day (BID) | INTRAVENOUS | Status: DC
  Administered 2021-12-04 – 2021-12-07 (×5): 3 mL via INTRAVENOUS

## 2021-12-04 MED ORDER — MIDODRINE HCL 5 MG PO TABS
5.0000 mg | ORAL_TABLET | Freq: Three times a day (TID) | ORAL | Status: DC
  Administered 2021-12-04 – 2021-12-07 (×8): 5 mg via ORAL
  Filled 2021-12-04 (×8): qty 1

## 2021-12-04 MED ORDER — CLOPIDOGREL BISULFATE 75 MG PO TABS
75.0000 mg | ORAL_TABLET | Freq: Every day | ORAL | Status: DC
  Administered 2021-12-05 – 2021-12-07 (×3): 75 mg via ORAL
  Filled 2021-12-04 (×3): qty 1

## 2021-12-04 MED ORDER — ATORVASTATIN CALCIUM 20 MG PO TABS
40.0000 mg | ORAL_TABLET | Freq: Every day | ORAL | Status: DC
  Administered 2021-12-05 – 2021-12-06 (×2): 40 mg via ORAL
  Filled 2021-12-04 (×3): qty 2

## 2021-12-04 NOTE — Assessment & Plan Note (Addendum)
Hemoglobin on admission low at 11.8, however this is comparable to most recent CBC obtained at the [place]. We will monitor hemoglobin levels while admitted given hematuria.  -CBC in the a.m.

## 2021-12-04 NOTE — Assessment & Plan Note (Signed)
On arrival to the ED, patient was noted to have widespread stage I pressure ulcers of bilateral gluteal region.  No evidence of ulcerations but patient experiencing associated pain.  -Wound care consult

## 2021-12-04 NOTE — H&P (Signed)
History and Physical    Patient: Antonio Phillips:[id number] DOB: 01/24/1946 DOA: 12/04/2021 DOS: the patient was seen and examined on 12/04/2021 PCP: Marsh Dolly, MD   Patient coming from: SNF [facility])  Chief Complaint:  Chief Complaint  Patient presents with   Altered Mental Status   HPI: Antonio Phillips is a 76 y.o. male with medical history significant of ESRD, chronic hypoxic respiratory failure on 2L [place], CAD s/p DES (09/2020), T2DM, permanent A. 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 History obtained from RN at [facility] and from patient.   Discussed with RN at [facility] Patient was recently admitted on 11/20/2021 - 11/24/2021 to the [facility], however RN unable to access records and patient is unable to recall why he was admitted. Since he was discharged back to his SNF, he was behaving at baseline, which is "sharp and Aox4." Yesterday, staff noted that Antonio Phillips was less alert than usual and he was disoriented to place and time. They noted generalized weakness. Today, symptoms persisted and so EMS was contacted. No recent fever, chills.   Of note, Antonio Phillips refused most recent dialysis that should have occurred on 12/02/2021. He is due for dialysis today, 12/04/2021. He is to take one tablet of Midodrine prior to HD, which he did not receive today. Blood pressure measurements at SNF are generally 90-100s/70s per RN.   Antonio Phillips states he is experiencing gluteal pain at this time. It has been ongoing for 5 years but worsened over the past week. He also endorses SOB but states this has been ongoing for 5 months. He denies any chest pain, nausea, vomiting, abdominal pain.   ED Course:  On arrival to the ED, patient was noted to be hypotensive at 87/64, with temperature of 97.8, respiratory rate of 18 and pulse of 77.  He was saturating at 94% on 2 L nasal cannula.  Initial  blood work demonstrated nonanion gap metabolic acidosis.  Urinalysis was obtained via in and out that showed turbid gross hematuria with significant pyuria.  Chest x-ray demonstrated right pleural effusion, moderate.  CT head without acute changes.  CT abdomen/pelvis pending.  Given acute encephalopathy and hypotension, TR[facility] was contacted for admission.  Review of Systems: As mentioned in the history of present illness. All other systems reviewed and are negative.  Past Medical History:  Diagnosis Date   Coronary artery disease    Demand ischemia ([place])    a. 11/2017 elev Trop in setting of AFib/SVT-->Echo [facility]): EF>55%. Mild LVH. Nl RV fxn.   Diabetes ([place])    a. Pt states that he has no hx of diabetes--A1c 5.5 11/2018.   ESRD (end stage renal disease) ([place])    a. On HD since 2019.   Hyperlipidemia    Hypotension    a. On midodrine.   Morbid obesity ([place])    PAD (peripheral artery disease) ([place])    a. s/p L AKA and R BKA.   Permanent atrial fibrillation ([place])    a. Dx 2019. CHA2DS2VASc = 3-4-->Eliquis.   Past Surgical History:  Procedure Laterality Date   AV FISTULA REPAIR     BELOW KNEE LEG AMPUTATION Bilateral    CHOLECYSTECTOMY     ERCP N/A 02/04/2021   Procedure: ENDOSCOPIC RETROGRADE CHOLANGIOPANCREATOGRAPHY (ERCP);  Surgeon: Lucilla Lame, MD;  Location: [facility] ENDOSCOPY;  Service: Endoscopy;  Laterality: N/A;   LEFT HEART  CATH AND CORONARY ANGIOGRAPHY N/A 09/22/2020   Procedure: LEFT HEART CATH AND CORONARY ANGIOGRAPHY;  Surgeon: Sherren Mocha, MD;  Location: [facility] CV LAB;  Service: Cardiovascular;  Laterality: N/A;   TOTAL HIP ARTHROPLASTY Bilateral    Social History:  reports that he has never smoked. He has never used smokeless tobacco. He reports that he does not drink alcohol and does not use drugs.  Allergies  Allergen Reactions   Simvastatin Other (See Comments)   No family history on file.  Prior to Admission medications   Medication Sig Start Date End Date  Taking? Authorizing Provider  acetaminophen (TYLENOL) 325 MG tablet Take 2 tablets (650 mg total) by mouth every 6 (six) hours as needed for mild pain or moderate pain. 01/17/20   Loletha Grayer, MD  apixaban (ELIQUIS) 5 MG TABS tablet Take 1 tablet (5 mg total) by mouth 2 (two) times daily. 01/17/20   Loletha Grayer, MD  atorvastatin (LIPITOR) 40 MG tablet Take 40 mg by mouth at bedtime. 09/17/20   [provider]  Carboxymethylcellulose Sod PF 0.5 % SOLN Place 1 drop into both eyes 3 (three) times daily as needed (dry eyes).    [provider]  clopidogrel (PLAVIX) 75 MG tablet Take 1 tablet (75 mg total) by mouth daily with breakfast. 09/23/20   Loletha Grayer, MD  dextromethorphan-guaiFENesin [facility] DM) 30-600 MG 12hr tablet Take 1 tablet by mouth 2 (two) times daily. 02/09/21   Lorella Nimrod, MD  diclofenac Sodium (VOLTAREN) 1 % GEL Apply 2 g topically in the morning. (Apply to right stump)    [provider]  finasteride (PROSCAR) 5 MG tablet Take 5 mg by mouth daily. 03/13/19   [provider]  HYDROcodone-acetaminophen (NORCO/VICODIN) 5-325 MG tablet Take 1 tablet by mouth every 6 (six) hours as needed for severe pain. 09/23/20   Loletha Grayer, MD  lactulose, encephalopathy, (CHRONULAC) 10 GM/15ML SOLN Take 10 g by mouth 2 (two) times daily as needed (mild constipation).    [provider]  loperamide (IMODIUM A-D) 2 MG tablet Take 2 tablets (4 mg total) by mouth 4 (four) times daily as needed for diarrhea or loose stools. 09/09/19   Carrie Mew, MD  loratadine (CLARITIN) 10 MG tablet Take 10 mg by mouth See admin instructions. Take 10 mg by mouth daily on Monday, Wednesday, Friday and Sunday    [provider]  metoprolol tartrate (LOPRESSOR) 25 MG tablet Take 0.5 tablets (12.5 mg total) by mouth 2 (two) times daily. 02/09/21   Lorella Nimrod, MD  midodrine (PROAMATINE) 10 MG tablet Take 1 tablet (10 mg total) by mouth 3 (three)  times daily with meals. 02/09/21   Lorella Nimrod, MD  pregabalin (LYRICA) 100 MG capsule Take 1 capsule (100 mg total) by mouth 2 (two) times daily. 02/09/21   Lorella Nimrod, MD  rOPINIRole (REQUIP) 1 MG tablet Take 1 mg by mouth at bedtime. 03/13/19   [provider]  sevelamer carbonate (RENVELA) 800 MG tablet Take 1,600 mg by mouth See admin instructions. Take 2 tablets (1600mg ) by mouth three times daily with meals on Monday, Wednesday, Friday and Sunday    [provider]  sevelamer carbonate (RENVELA) 800 MG tablet Take 800 mg by mouth See admin instructions. Take 2 tablets (1600mg ) by mouth twice daily with meals (breakfast and dinner) on Tuesday, Thursday and Saturday    [provider]  sodium hypochlorite (DAKIN'S 1/4 STRENGTH) 0.125 % SOLN Apply 1 application topically See admin instructions. Use  as directed after cleansing left stump wound - use twice daily and as needed 01/15/21   [provider]  vitamin B-12 (CYANOCOBALAMIN) 1000 MCG tablet Take 1,000 mcg by mouth daily.    [provider]   Physical Exam: Vitals:   12/04/21 1330 12/04/21 1500 12/04/21 1645 12/04/21 1649  BP: 94/71 91/68 96/61  91/62  Pulse: 68 (!) 35 92 91  Resp: 16 10 12 12   Temp:   97.8 F (36.6 C)   TempSrc:   Oral   SpO2: 95% 97% 96% 100%  Weight:       Physical Exam Vitals and nursing note reviewed.  Constitutional:      General: He is not in acute distress.    Appearance: He is obese.  HENT:     Head: Normocephalic and atraumatic.     Mouth/Throat:     Mouth: Mucous membranes are dry.     Comments: Poor dentition Eyes:     General: No scleral icterus.    Extraocular Movements: Extraocular movements intact.     Conjunctiva/sclera: Conjunctivae normal.     Pupils: Pupils are equal, round, and reactive to light.  Cardiovascular:     Rate and Rhythm: Normal rate. Rhythm irregularly irregular.     Heart sounds: Murmur (3/6 systolic murmur) heard.      Comments: +2 pitting edema extending from bilateral stumps up to mid-abdomen Pulmonary:     Effort: Pulmonary effort is normal. No tachypnea, accessory muscle usage or respiratory distress.     Breath sounds: Examination of the left-middle field reveals decreased breath sounds. Examination of the right-lower field reveals decreased breath sounds. Examination of the left-lower field reveals decreased breath sounds. Decreased breath sounds present. No wheezing, rhonchi or rales.  Abdominal:     General: Bowel sounds are normal. There is distension.     Tenderness: There is no abdominal tenderness. There is no guarding.  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     Left Lower Extremity: Left leg is amputated above knee.  Skin:    General: Skin is warm and dry.  Neurological:     Mental Status: He is easily aroused. He is lethargic.     Cranial Nerves: No facial asymmetry.     Comments: Lethargic, however oriented to person, place, time. Not oriented to reason for hospitalization 4/5 strength of bilateral upper extremities    Data Reviewed: CBC remarkable for hemoglobin of 11.8, MCV 109, platelets of 85.  CMP remarkable for CO2 of 17, BUN of 39, calcium of 7.1, total protein of 4.5, albumin of 2.5, total bilirubin of 1.6.  Lactic acid within normal limits at 1.4.  COVID-19 PCR negative. Urinalysis demonstrated turbid red urine with over 50 RBCs/hpf and over 50 WBC/hpf  Chest x-ray personally reviewed: Patient rotated.  Moderate to large right pleural effusion  CT head demonstrated no evidence of acute intracranial abnormality but chronic changes noted including small vessel ischemic changes, frontal lobe cerebral atrophy, apparent nasal sinus disease and bilateral mastoid effusions.  CT abdomen/pelvis obtained remarkable for moderate volume ascites with large right pleural effusion and small  left pleural effusion in addition to an narco.  Results remarkable for diverticulosis, cholelithiasis without cholecystitis, coronary artery disease, and multiple bilateral exophytic renal lesions.  EKG personally reviewed: Low voltage noted.  Atrial fibrillation with rate of 85.  No ST or T wave changes concerning for ACS.  Results are pending, will review when available.  Assessment and Plan: * Acute encephalopathy Antonio Phillips is presenting with 1 day history of acute encephalopathy noted per SNF staff including disorientation and lethargy.  On my examination, patient is oriented x4, however he is quite lethargic with difficulty maintaining focus on conversation and taking prolonged time to answer questions.  Primary differential includes metabolic encephalopathy secondary to missed dialysis.  In addition he is taking Lyrica nightly, which in the setting of missed dialysis may contribute to encephalopathy.   Differential also includes occult infection although patient is afebrile with no leukocytosis or tachycardia. Hypotension is chronic and likely from reduced midodrine dose. Urinalysis shows frank hematuria with pyuria. CT abdomen/pelvis demonstrated large right pleural effusion.  On examination, he does have evidence of stage I pressure ulceration but no evidence of cellulitis or ulceration.  - Plan for dialysis today per nephrology - S/p Vanco and cefepime in the ED - Hold further antibiotics for now given low suspicion - IR consultation for thoracentesis.  Follow pleural culture results - Urine culture pending - Low threshold to restart antimicrobial should patient develop tachycardia, leukocytosis or fever - Hold centrally acting medications including oxycodone, pregabalin, ropinirole and trazodone  Hypotension Patient has a long-term history of chronic hypotension previously treated with midodrine 10 mg 3 times daily.  Most recent MAR from [facility] demonstrates he is only taking 5 mg  prior to HD.  Patient received midodrine in the ED with small fluid bolus with improvement in blood pressure.  Low suspicion that current hypotension is secondary to infection given lack of fever, leukocytosis, tachypnea or tachycardia.  - Start midodrine 5 mg 3 times daily and titrate as needed to maintain MAP above 65  Pleural effusion on right Patient has a history of chronic hypoxic respiratory failure on 3 L nasal cannula daily.  On admission, he noted shortness of breath ongoing for the last 5 months.  CT abdomen/pelvis demonstrated large right pleural effusion.  No prior thoracentesis.  Given anasarca, suspect transudative, however given encephalopathy, will need to rule out infection.  -IR consult for thoracentesis  Hematuria On arrival to the ED, urinalysis was obtained via in and out; on catheter placement, gross hematuria drained into Foley.  Given multiple renal cysts noted on CT abdomen/pelvis, will need to rule out renal cell carcinoma.  - Renal ultrasound pending  ESRD (end stage renal disease) [facility]) - Nephrology consulted for dialysis management - Plan for dialysis today  Pressure injury of skin On arrival to the ED, patient was noted to have widespread stage I pressure ulcers of bilateral gluteal region.  No evidence of ulcerations but patient experiencing associated pain.  -Wound care consult  Atrial fibrillation, chronic (HCC) No evidence of RVR at this time.  Per [facility] records, patient is no longer on anticoagulation.  - Restart home metoprolol tomorrow morning after restarting midodrine  Anemia of chronic disease Hemoglobin on admission low at 11.8, however this is comparable to most recent CBC obtained at the [place]. We will monitor hemoglobin levels while admitted given hematuria.  -CBC in the a.m.  Thrombocytopenia (HCC) Chronic thrombocytopenia previously evaluated by hematology in the past.  Platelets on admission low at 85, however this is an improvement  compared to most recent CBC at the [place] at which time his platelets were 66.  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  Diffuse anasarca noted on examination, however I suspect this is more likely in the setting of missed dialysis rather than decompensated heart failure.    - BNP and troponin ordered in the ED; results pending.     Advance Care Planning:   Code Status: DNR. DNR form from [facility] present at bedside. Confirmed with patient he is DNR.   Consults: Nephrology  Family Communication: No family at bedside to update.   Severity of Illness: The appropriate patient status for this patient is OBSERVATION. Observation status is judged to be reasonable and necessary in order to provide the required intensity of service to ensure the patient's safety. The patient's presenting symptoms, physical exam findings, and initial radiographic and laboratory data in the context of their medical condition is felt to place them at decreased risk for further clinical deterioration. Furthermore, it is anticipated that the patient will be medically stable for discharge from the hospital within 2 midnights of admission.   Author: Jose Persia, MD 12/04/2021 5:34 PM  For on call review [email or web address].

## 2021-12-04 NOTE — ED Notes (Signed)
Called lab to request phlebotomist

## 2021-12-04 NOTE — Assessment & Plan Note (Addendum)
Patient has a history of heart failure with EF of 40 to 45% with most recent echo in December 2022.  Diffuse anasarca noted on examination, however I suspect this is more likely in the setting of missed dialysis rather than decompensated heart failure.    - BNP and troponin ordered in the ED; results pending.

## 2021-12-04 NOTE — Assessment & Plan Note (Signed)
Patient has a long-term history of chronic hypotension previously treated with midodrine 10 mg 3 times daily.  Most recent MAR from [facility] demonstrates he is only taking 5 mg prior to HD.  Patient received midodrine in the ED with small fluid bolus with improvement in blood pressure.  Low suspicion that current hypotension is secondary to infection given lack of fever, leukocytosis, tachypnea or tachycardia.  - Start midodrine 5 mg 3 times daily and titrate as needed to maintain MAP above 65

## 2021-12-04 NOTE — ED Notes (Signed)
Patient transported to CT 

## 2021-12-04 NOTE — Progress Notes (Signed)
Hd tx of 1.5hrs completed per pt request. 32.1L total vol processed. No complications. Report given to melanie fowler, rn. Dr. Holley Raring notified. Total uf removed: 263ml Post hd v/s: 92/57(69) 86 18 99%

## 2021-12-04 NOTE — Consult Note (Signed)
PHARMACY -  BRIEF ANTIBIOTIC NOTE   Pharmacy has received consult(s) for Vancomycin and Cefepime from an ED provider.  The patient's profile has been reviewed for ht/wt/allergies/indication/available labs.    One time order(s) placed for Vancomycin 2g IV and Cefepime 2g IV x 1 dose each.  Further antibiotics/pharmacy consults should be ordered by admitting physician if indicated.                       Thank you, Pearla Dubonnet 12/04/2021  11:49 AM

## 2021-12-04 NOTE — Assessment & Plan Note (Signed)
Patient has a history of chronic hypoxic respiratory failure on 3 L nasal cannula daily.  On admission, he noted shortness of breath ongoing for the last 5 months.  CT abdomen/pelvis demonstrated large right pleural effusion.  No prior thoracentesis.  Given anasarca, suspect transudative, however given encephalopathy, will need to rule out infection.  -IR consult for thoracentesis

## 2021-12-04 NOTE — ED Notes (Signed)
Pt transported to dialysis

## 2021-12-04 NOTE — Assessment & Plan Note (Signed)
On arrival to the ED, urinalysis was obtained via in and out; on catheter placement, gross hematuria drained into Foley.  Given multiple renal cysts noted on CT abdomen/pelvis, will need to rule out renal cell carcinoma.  - Renal ultrasound pending
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
## 2021-12-04 NOTE — Assessment & Plan Note (Signed)
Chronic thrombocytopenia previously evaluated by hematology in the past.  Platelets on admission low at 85, however this is an improvement compared to most recent CBC at the [place] at which time his platelets were 66.

## 2021-12-04 NOTE — Assessment & Plan Note (Signed)
-   Nephrology consulted for dialysis management - Plan for dialysis today

## 2021-12-04 NOTE — Assessment & Plan Note (Signed)
No evidence of RVR at this time.  Per [facility] records, patient is no longer on anticoagulation.  - Restart home metoprolol tomorrow morning after restarting midodrine

## 2021-12-04 NOTE — ED Notes (Signed)
Pt has returned from dialysis. The dialysis RN states he did not want to finish his treatment and only received 1.5 hours in which 24ml were pulled. The dialysis RN Darla Lesches stated he would inform the admitting provider.

## 2021-12-04 NOTE — Assessment & Plan Note (Addendum)
[name] is presenting with 1 day history of acute encephalopathy noted per SNF staff including disorientation and lethargy.  On my examination, patient is oriented x4, however he is quite lethargic with difficulty maintaining focus on conversation and taking prolonged time to answer questions.  Primary differential includes metabolic encephalopathy secondary to missed dialysis.  In addition he is taking Lyrica nightly, which in the setting of missed dialysis may contribute to encephalopathy.   Differential also includes occult infection although patient is afebrile with no leukocytosis or tachycardia. Hypotension is chronic and likely from reduced midodrine dose. Urinalysis shows frank hematuria with pyuria. CT abdomen/pelvis demonstrated large right pleural effusion.  On examination, he does have evidence of stage I pressure ulceration but no evidence of cellulitis or ulceration.  - Plan for dialysis today per nephrology - S/p Vanco and cefepime in the ED - Hold further antibiotics for now given low suspicion - IR consultation for thoracentesis.  Follow pleural culture results - Urine culture pending - Low threshold to restart antimicrobial should patient develop tachycardia, leukocytosis or fever - Hold centrally acting medications including oxycodone, pregabalin, ropinirole and trazodone

## 2021-12-04 NOTE — ED Provider Notes (Signed)
[facility] Provider Note    Event Date/Time   First MD Initiated Contact with Patient 12/04/21 1052     (approximate)   History   Altered Mental Status   HPI  Antonio Phillips is a 76 y.o. male with history of end-stage renal disease, hypertension, obesity, here with generalized fatigue and altered mental status.  The patient arrives via EMS.  Per report, he has been increasingly confused at his nursing facility over the last several days.  On my assessment he states he just feels "awful."  He states he has had worsening pain in his buttocks area at sores and does not feel like the nursing staff has been dressing it appropriately.  He has had a mild cough.  He is felt generally fatigued and weak.  He feels like he needs to sleep.  Denies any other complaints.  No headache.     Physical Exam   Triage Vital Signs: ED Triage Vitals  Enc Vitals Group     BP 12/04/21 1043 (!) 87/64     Pulse Rate 12/04/21 1043 77     Resp 12/04/21 1043 18     Temp 12/04/21 1043 97.8 F (36.6 C)     Temp Source 12/04/21 1043 Oral     SpO2 12/04/21 1043 94 %     Weight 12/04/21 1044 222 lb 7.1 oz (100.9 kg)     Height --      Head Circumference --      Peak Flow --      Pain Score 12/04/21 1044 6     Pain Loc --      Pain Edu? --      Excl. in [place]? --     Most recent vital signs: Vitals:   12/04/21 1836 12/04/21 1900  BP: (!) 92/57 93/72  Pulse: 86   Resp: 18   Temp: (!) 97.5 F (36.4 C)   SpO2: 99% 96%     General: Awake, no distress.  CV:  Good peripheral perfusion.  Regular rate and rhythm. Resp:  Normal effort.  Lungs clear to auscultation bilaterally. Abd:  No distention.  No tenderness. Other:  Chronically ill-appearing.  Superficial wounds noted to the buttocks area with stage II pressure ulcers and some mild surrounding erythema.  No purulent drainage.  No malodor.   ED Results / Procedures / Treatments   Labs (all labs ordered are listed,  but only abnormal results are displayed) Labs Reviewed  COMPREHENSIVE METABOLIC PANEL - Abnormal; Notable for the following components:      Result Value   Chloride 114 (*)    CO2 17 (*)    BUN 39 (*)    Creatinine, Ser 4.19 (*)    Calcium 7.1 (*)    Total Protein 4.5 (*)    Albumin 2.5 (*)    Total Bilirubin 1.6 (*)    GFR, Estimated 14 (*)    All other components within normal limits  CBC WITH DIFFERENTIAL/PLATELET - Abnormal; Notable for the following components:   RBC 3.79 (*)    Hemoglobin 11.8 (*)    MCV 109.0 (*)    MCHC 28.6 (*)    RDW 19.9 (*)    Platelets 85 (*)    All other components within normal limits  URINALYSIS, ROUTINE W REFLEX MICROSCOPIC - Abnormal; Notable for the following components:   Color, Urine RED (*)    APPearance TURBID (*)    Glucose, UA   (*)  Value: TEST NOT REPORTED DUE TO COLOR INTERFERENCE OF URINE PIGMENT   Hgb urine dipstick   (*)    Value: TEST NOT REPORTED DUE TO COLOR INTERFERENCE OF URINE PIGMENT   Bilirubin Urine   (*)    Value: TEST NOT REPORTED DUE TO COLOR INTERFERENCE OF URINE PIGMENT   Ketones, ur   (*)    Value: TEST NOT REPORTED DUE TO COLOR INTERFERENCE OF URINE PIGMENT   Protein, ur   (*)    Value: TEST NOT REPORTED DUE TO COLOR INTERFERENCE OF URINE PIGMENT   Nitrite   (*)    Value: TEST NOT REPORTED DUE TO COLOR INTERFERENCE OF URINE PIGMENT   Leukocytes,Ua   (*)    Value: TEST NOT REPORTED DUE TO COLOR INTERFERENCE OF URINE PIGMENT   RBC / HPF >50 (*)    WBC, UA >50 (*)    All other components within normal limits  BRAIN NATRIURETIC PEPTIDE - Abnormal; Notable for the following components:   B Natriuretic Peptide 1,932.3 (*)    All other components within normal limits  BLOOD GAS, VENOUS - Abnormal; Notable for the following components:   pH, Ven 7.14 (*)    pCO2, Ven 43 (*)    pO2, Ven 53 (*)    Bicarbonate 14.6 (*)    Acid-base deficit 14.0 (*)    All other components within normal limits  SARS CORONAVIRUS  2 BY RT PCR  CULTURE, BLOOD (ROUTINE X 2)  CULTURE, BLOOD (ROUTINE X 2)  URINE CULTURE  LACTIC ACID, PLASMA  HEPATITIS B SURFACE ANTIGEN  HEPATITIS B SURFACE ANTIBODY,QUALITATIVE  HEPATITIS B SURFACE ANTIBODY, QUANTITATIVE  HEPATITIS B CORE ANTIBODY, TOTAL  HEPATITIS C ANTIBODY  COMPREHENSIVE METABOLIC PANEL  PHOSPHORUS  CBC WITH DIFFERENTIAL/PLATELET  PROTIME-INR     EKG Atrial fibrillation, ventricular rate 85.  QRS 1 2, QTc 536.  No acute ST elevations or depressions.  No acute treatments of acute ischemia or infarct.   RADIOLOGY Chest x-ray: Cardiomegaly, moderate right pleural effusion, possible underlying pneumonia CT head: No acute intracranial abnormality, mild chronic small vessel ischemic changes   I also independently reviewed and agree with radiologist interpretations.   PROCEDURES:  Critical Care performed: Yes, see critical care procedure note(s)  .Critical Care  Performed by: Duffy Bruce, MD Authorized by: Duffy Bruce, MD   Critical care provider statement:    Critical care time (minutes):  30   Critical care time was exclusive of:  Separately billable procedures and treating other patients   Critical care was necessary to treat or prevent imminent or life-threatening deterioration of the following conditions:  Respiratory failure, circulatory failure, cardiac failure and sepsis   Critical care was time spent personally by me on the following activities:  Development of treatment plan with patient or surrogate, discussions with consultants, evaluation of patient's response to treatment, examination of patient, ordering and review of laboratory studies, ordering and review of radiographic studies, ordering and performing treatments and interventions, pulse oximetry, re-evaluation of patient's condition and review of old charts     MEDICATIONS ORDERED IN ED: Medications  heparin injection 5,000 Units (5,000 Units Subcutaneous Given 12/04/21 1546)   sodium chloride flush (NS) 0.9 % injection 3 mL (3 mLs Intravenous Given 12/04/21 1547)  acetaminophen (TYLENOL) tablet 650 mg (has no administration in time range)    Or  acetaminophen (TYLENOL) suppository 650 mg (has no administration in time range)  polyethylene glycol (MIRALAX / GLYCOLAX) packet 17 g (has no administration in time  range)  Chlorhexidine Gluconate Cloth 2 % PADS 6 each (has no administration in time range)  pentafluoroprop-tetrafluoroeth (GEBAUERS) aerosol 1 Application (has no administration in time range)  lidocaine (PF) (XYLOCAINE) 1 % injection 5 mL (has no administration in time range)  lidocaine-prilocaine (EMLA) cream 1 Application (has no administration in time range)  heparin injection 1,000 Units (has no administration in time range)  alteplase (CATHFLO ACTIVASE) injection 2 mg (has no administration in time range)  midodrine (PROAMATINE) tablet 5 mg (5 mg Oral Given 12/04/21 1858)  atorvastatin (LIPITOR) tablet 40 mg (has no administration in time range)  clopidogrel (PLAVIX) tablet 75 mg (has no administration in time range)  diclofenac Sodium (VOLTAREN) 1 % topical gel 2 g (has no administration in time range)  folic acid (FOLVITE) tablet 1 mg (1 mg Oral Given 12/04/21 1858)  sertraline (ZOLOFT) tablet 50 mg (50 mg Oral Given 12/04/21 1857)  metoprolol tartrate (LOPRESSOR) tablet 12.5 mg (has no administration in time range)  midodrine (PROAMATINE) tablet 10 mg (10 mg Oral Given 12/04/21 1134)  sodium chloride 0.9 % bolus 500 mL (0 mLs Intravenous Stopped 12/04/21 1419)  vancomycin (VANCOREADY) IVPB 2000 mg/400 mL (0 mg Intravenous Stopped 12/04/21 1546)  ceFEPIme (MAXIPIME) 2 g in sodium chloride 0.9 % 100 mL IVPB (0 g Intravenous Stopped 12/04/21 1324)     IMPRESSION / MDM / ASSESSMENT AND PLAN / ED COURSE  I reviewed the triage vital signs and the nursing notes.                               The patient is on the cardiac monitor to evaluate for evidence of  arrhythmia and/or significant heart rate changes.   Ddx:  Differential includes the following, with pertinent life- or limb-threatening emergencies considered:  Acute encephalopathy 2/2 hypoperfusion, sepsis, anemia, uremia, polypharmacy, SDH, CVA, occult UTI or PNA  Patient's presentation is most consistent with acute presentation with potential threat to life or bodily function.  MDM:  76 yo M with PMhx ESRD, HTN, chronic sacral wounds, here with AMS, hypotension. On arrival, pt encephalopathic but with no focal deficits. Concern for sepsis, hypoperfusion related to hypotension which is a known issue, and per review of MAR he has not been receiving his midodrine regularly. Gentle fluids, midodrine given and broad-spectrum ABX started. CXR read as small effusion, UA concerning for UTI though difficulty to assess given his poor urine output from ESRD. Labs o/w show metabolic acidosis with pH 7.1, CO2 17, no leukocytosis or lactic acidosis. Dr. Holley Raring consulted with Nephrology, will take to HD. Will admit to medicine. Pt improving clinically with fluids and midodrine. CT head shows NAICA.   MEDICATIONS GIVEN IN ED: Medications  heparin injection 5,000 Units (5,000 Units Subcutaneous Given 12/04/21 1546)  sodium chloride flush (NS) 0.9 % injection 3 mL (3 mLs Intravenous Given 12/04/21 1547)  acetaminophen (TYLENOL) tablet 650 mg (has no administration in time range)    Or  acetaminophen (TYLENOL) suppository 650 mg (has no administration in time range)  polyethylene glycol (MIRALAX / GLYCOLAX) packet 17 g (has no administration in time range)  Chlorhexidine Gluconate Cloth 2 % PADS 6 each (has no administration in time range)  pentafluoroprop-tetrafluoroeth (GEBAUERS) aerosol 1 Application (has no administration in time range)  lidocaine (PF) (XYLOCAINE) 1 % injection 5 mL (has no administration in time range)  lidocaine-prilocaine (EMLA) cream 1 Application (has no administration in time  range)  heparin  injection 1,000 Units (has no administration in time range)  alteplase (CATHFLO ACTIVASE) injection 2 mg (has no administration in time range)  midodrine (PROAMATINE) tablet 5 mg (5 mg Oral Given 12/04/21 1858)  atorvastatin (LIPITOR) tablet 40 mg (has no administration in time range)  clopidogrel (PLAVIX) tablet 75 mg (has no administration in time range)  diclofenac Sodium (VOLTAREN) 1 % topical gel 2 g (has no administration in time range)  folic acid (FOLVITE) tablet 1 mg (1 mg Oral Given 12/04/21 1858)  sertraline (ZOLOFT) tablet 50 mg (50 mg Oral Given 12/04/21 1857)  metoprolol tartrate (LOPRESSOR) tablet 12.5 mg (has no administration in time range)  midodrine (PROAMATINE) tablet 10 mg (10 mg Oral Given 12/04/21 1134)  sodium chloride 0.9 % bolus 500 mL (0 mLs Intravenous Stopped 12/04/21 1419)  vancomycin (VANCOREADY) IVPB 2000 mg/400 mL (0 mg Intravenous Stopped 12/04/21 1546)  ceFEPIme (MAXIPIME) 2 g in sodium chloride 0.9 % 100 mL IVPB (0 g Intravenous Stopped 12/04/21 1324)     Consults:  Hospitalist Nephrology   EMR reviewed       FINAL CLINICAL IMPRESSION(S) / ED DIAGNOSES   Final diagnoses:  None     Rx / DC Orders   ED Discharge Orders     None        Note:  This document was prepared using Dragon voice recognition software and may include unintentional dictation errors.   Duffy Bruce, MD 12/04/21 [phone]

## 2021-12-05 ENCOUNTER — Encounter: Payer: Self-pay | Admitting: Internal Medicine

## 2021-12-05 DIAGNOSIS — R601 Generalized edema: Secondary | ICD-10-CM

## 2021-12-05 DIAGNOSIS — G9340 Encephalopathy, unspecified: Secondary | ICD-10-CM | POA: Diagnosis not present

## 2021-12-05 LAB — COMPREHENSIVE METABOLIC PANEL
ALT: 9 U/L (ref 0–44)
AST: 16 U/L (ref 15–41)
Albumin: 3.4 g/dL — ABNORMAL LOW (ref 3.5–5.0)
Alkaline Phosphatase: 75 U/L (ref 38–126)
Anion gap: 13 (ref 5–15)
BUN: 44 mg/dL — ABNORMAL HIGH (ref 8–23)
CO2: 26 mmol/L (ref 22–32)
Calcium: 9.3 mg/dL (ref 8.9–10.3)
Chloride: 102 mmol/L (ref 98–111)
Creatinine, Ser: 5.21 mg/dL — ABNORMAL HIGH (ref 0.61–1.24)
GFR, Estimated: 11 mL/min — ABNORMAL LOW (ref >60)
Glucose, Bld: 96 mg/dL (ref 70–99)
Potassium: 4.2 mmol/L (ref 3.5–5.1)
Sodium: 141 mmol/L (ref 135–145)
Total Bilirubin: 1.6 mg/dL — ABNORMAL HIGH (ref 0.3–1.2)
Total Protein: 6.2 g/dL — ABNORMAL LOW (ref 6.5–8.1)

## 2021-12-05 LAB — CBC WITH DIFFERENTIAL/PLATELET
Abs Immature Granulocytes: 0.03 10*3/uL (ref 0.00–0.07)
Basophils Absolute: 0 10*3/uL (ref 0.0–0.1)
Basophils Relative: 0 %
Eosinophils Absolute: 0.1 10*3/uL (ref 0.0–0.5)
Eosinophils Relative: 1 %
HCT: 37.6 % — ABNORMAL LOW (ref 39.0–52.0)
Hemoglobin: 11.2 g/dL — ABNORMAL LOW (ref 13.0–17.0)
Immature Granulocytes: 0 %
Lymphocytes Relative: 20 %
Lymphs Abs: 1.4 10*3/uL (ref 0.7–4.0)
MCH: 31.3 pg (ref 26.0–34.0)
MCHC: 29.8 g/dL — ABNORMAL LOW (ref 30.0–36.0)
MCV: 105 fL — ABNORMAL HIGH (ref 80.0–100.0)
Monocytes Absolute: 0.5 10*3/uL (ref 0.1–1.0)
Monocytes Relative: 7 %
Neutro Abs: 5.1 10*3/uL (ref 1.7–7.7)
Neutrophils Relative %: 72 %
Platelets: 70 10*3/uL — ABNORMAL LOW (ref 150–400)
RBC: 3.58 MIL/uL — ABNORMAL LOW (ref 4.22–5.81)
RDW: 19.7 % — ABNORMAL HIGH (ref 11.5–15.5)
WBC: 7.1 10*3/uL (ref 4.0–10.5)
nRBC: 0 % (ref 0.0–0.2)

## 2021-12-05 LAB — PROTIME-INR
INR: 1.4 — ABNORMAL HIGH (ref 0.8–1.2)
Prothrombin Time: 17 seconds — ABNORMAL HIGH (ref 11.4–15.2)

## 2021-12-05 LAB — MRSA NEXT GEN BY PCR, NASAL: MRSA by PCR Next Gen: DETECTED — AB

## 2021-12-05 LAB — PHOSPHORUS: Phosphorus: 4.4 mg/dL (ref 2.5–4.6)

## 2021-12-05 MED ORDER — MUPIROCIN 2 % EX OINT
1.0000 | TOPICAL_OINTMENT | Freq: Two times a day (BID) | CUTANEOUS | Status: DC
  Administered 2021-12-05 – 2021-12-07 (×4): 1 via NASAL
  Filled 2021-12-05: qty 22

## 2021-12-05 NOTE — ED Notes (Signed)
This RN applied barrier cream to pt buttock and groin.  Pt is very excoriated and bleeding in some spots.  Unit secretary states that the day shift unit secretary is able to contact wound care RN and they will come see this patient.

## 2021-12-05 NOTE — Progress Notes (Signed)
PROGRESS NOTE    Antonio Phillips  VWU:[id number] DOB: 1945-06-12 DOA: 12/04/2021 PCP: Marsh Dolly, MD    Brief Narrative:  Antonio Phillips is a 76 y.o. male with medical history significant of ESRD, chronic hypoxic respiratory failure on 2L , CAD s/p DES (09/2020), T2DM, permanent A. 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 History obtained from RN at [facility] and from patient.   10/1 MS has improved today. Had HD yesterday.   Consultants:  Nephrology   Procedures: HD, CT, renal US  Antimicrobials:      Subjective: At baseline sob at rest.  Objective: Vitals:   12/05/21 1030 12/05/21 1128 12/05/21 1130 12/05/21 1530  BP: 114/76  103/76 100/61  Pulse: (!) 49  79 78  Resp: 14  (!) 21 16  Temp:  97.6 F (36.4 C)    TempSrc:  Oral    SpO2: 97%  94% 99%  Weight:        Intake/Output Summary (Last 24 hours) at 12/05/2021 1545 Last data filed at 12/04/2021 1830 Gross per 24 hour  Intake --  Output 200 ml  Net -200 ml   Filed Weights   12/04/21 1044  Weight: 100.9 kg    Examination: Calm, NAD Decrease bs, with scattered crakels Reg s1/s2 no gallop Soft benign +bs, some fluid shift RBKA, LBKA +edema. Aaoxox3 , responding appropriately Mood and affect appropriate in current setting     Data Reviewed: I have personally reviewed following labs and imaging studies  CBC: Recent Labs  Lab 12/04/21 1054 12/05/21 0919  WBC 8.3 7.1  NEUTROABS 6.1 5.1  HGB 11.8* 11.2*  HCT 41.3 37.6*  MCV 109.0* 105.0*  PLT 85* 70*   Basic Metabolic Panel: Recent Labs  Lab 12/04/21 1054 12/05/21 0919  NA 141 141  K 3.9 4.2  CL 114* 102  CO2 17* 26  GLUCOSE 71 96  BUN 39* 44*  CREATININE 4.19* 5.21*  CALCIUM 7.1* 9.3  PHOS  --  4.4   GFR: Estimated Creatinine Clearance: 14.6 mL/min (A) (by C-G formula based on SCr of 5.21 mg/dL (H)). Liver Function Tests: Recent Labs  Lab  12/04/21 1054 12/05/21 0919  AST 16 16  ALT 6 9  ALKPHOS 56 75  BILITOT 1.6* 1.6*  PROT 4.5* 6.2*  ALBUMIN 2.5* 3.4*   No results for input(s): "LIPASE", "AMYLASE" in the last 168 hours. No results for input(s): "AMMONIA" in the last 168 hours. Coagulation Profile: Recent Labs  Lab 12/05/21 0919  INR 1.4*   Cardiac Enzymes: No results for input(s): "CKTOTAL", "CKMB", "CKMBINDEX", "TROPONINI" in the last 168 hours. BNP (last 3 results) No results for input(s): "PROBNP" in the last 8760 hours. HbA1C: No results for input(s): "HGBA1C" in the last 72 hours. CBG: No results for input(s): "GLUCAP" in the last 168 hours. Lipid Profile: No results for input(s): "CHOL", "HDL", "LDLCALC", "TRIG", "CHOLHDL", "LDLDIRECT" in the last 72 hours. Thyroid Function Tests: No results for input(s): "TSH", "T4TOTAL", "FREET4", "T3FREE", "THYROIDAB" in the last 72 hours. Anemia Panel: No results for input(s): "VITAMINB12", "FOLATE", "FERRITIN", "TIBC", "IRON", "RETICCTPCT" in the last 72 hours. Sepsis Labs: Recent Labs  Lab 12/04/21 1054  LATICACIDVEN 1.4    Recent Results (from the past 240 hour(s))  Culture, blood (Routine x 2)     Status: None (Preliminary result)   Collection Time: 12/04/21 10:54 AM   Specimen: Right Antecubital; Blood  Result Value Ref Range Status   Specimen Description RIGHT ANTECUBITAL  Final   Special Requests   Final    BOTTLES DRAWN AEROBIC AND ANAEROBIC Blood Culture adequate volume   Culture   Final    NO GROWTH < 24 HOURS Performed at [facility], [address], [place], [place] [address]    Report Status PENDING  Incomplete  SARS Coronavirus 2 by RT PCR (hospital order, performed in [facility] hospital lab) *cepheid single result test* Anterior Nasal Swab     Status: None   Collection Time: 12/04/21 11:44 AM   Specimen: Anterior Nasal Swab  Result Value Ref Range Status   SARS Coronavirus 2 by RT PCR NEGATIVE NEGATIVE Final    Comment:  (NOTE) SARS-CoV-2 target nucleic acids are NOT DETECTED.  The SARS-CoV-2 RNA is generally detectable in upper and lower respiratory specimens during the acute phase of infection. The lowest concentration of SARS-CoV-2 viral copies this assay can detect is 250 copies / mL. A negative result does not preclude SARS-CoV-2 infection and should not be used as the sole basis for treatment or other patient management decisions.  A negative result may occur with improper specimen collection / handling, submission of specimen other than nasopharyngeal swab, presence of viral mutation(s) within the areas targeted by this assay, and inadequate number of viral copies (<250 copies / mL). A negative result must be combined with clinical observations, patient history, and epidemiological information.  Fact Sheet for Patients:   https://www.patel.info/  Fact Sheet for Healthcare Providers: https://hall.com/  This test is not yet approved or  cleared by the [place] FDA and has been authorized for detection and/or diagnosis of SARS-CoV-2 by FDA under an Emergency Use Authorization (EUA).  This EUA will remain in effect (meaning this test can be used) for the duration of the COVID-19 declaration under Section 564(b)(1) of the Act, 21 U.S.C. section 360bbb-3(b)(1), unless the authorization is terminated or revoked sooner.  Performed at [facility], [address], [place], [place] [address]          Radiology Studies: US RENAL  Result Date: 12/04/2021 CLINICAL DATA:  Hematuria. EXAM: RENAL / URINARY TRACT ULTRASOUND COMPLETE COMPARISON:  Same day CT abdomen pelvis at 1439 hours FINDINGS: Right Kidney: Renal measurements: 9.8 x 5.7 x 5.1 cm = volume: 151 mL. Diffuse cortical thinning and increased echogenicity. No hydronephrosis visualized. A 2.6 cm cortical cyst is noted at the lower pole of the right kidney Left Kidney: Renal  measurements: 11.4 x 6.4 x 6.6 cm = volume: 151 mL. Diffuse cortical thinning and increased echogenicity. No hydronephrosis visualized. A 3.5 cm cortical cyst is noted at the middle third of the left kidney. Bladder: Appears normal for degree of bladder distention. Other: Free fluid is noted in the right upper quadrant. Right pleural effusion is noted. Cholelithiasis. Gallbladder wall measures 0.4 cm in thickness. Multiple echogenic foci within the spleen correspond to vascular calcifications seen on same day CT abdomen pelvis. IMPRESSION: 1. Atrophic kidneys, consistent with chronic medical renal disease. 2. Single bilateral benign renal cortical cysts are noted. The other multiple bilateral exophytic renal lesions described on same day CT abdomen pelvis are not visualized as the exam is technically challenging secondary to patient body habitus and immobility. If further evaluation is clinically desired, abdominal MRI could be performed when the patient is clinically stable and able to follow directions and hold their breath (preferably as an outpatient). 3. Right upper quadrant ascites and right pleural  effusion, corresponding to findings on same day CT abdomen pelvis. 4. Cholelithiasis. Gallbladder wall thickening is favored to represent sequela of fluid third-spacing as the gallbladder is minimally distended. Electronically Signed   By: Ileana Roup M.D.   On: 12/04/2021 21:02   CT ABDOMEN PELVIS WO CONTRAST  Result Date: 12/04/2021 CLINICAL DATA:  Lower abdominal, buttock, and hip pain EXAM: CT ABDOMEN AND PELVIS WITHOUT CONTRAST TECHNIQUE: Multidetector CT imaging of the abdomen and pelvis was performed following the standard protocol without IV contrast. RADIATION DOSE REDUCTION: This exam was performed according to the departmental dose-optimization program which includes automated exposure control, adjustment of the mA and/or kV according to patient size and/or use of iterative reconstruction technique.  COMPARISON:  CT chest abdomen pelvis, 02/20/2021 FINDINGS: Lower chest: Cardiomegaly. Three-vessel coronary artery calcifications. Aortic valve calcifications. Hepatobiliary: No solid liver abnormality is seen. Gallstones contracted in the gallbladder. No gallbladder wall thickening, or biliary dilatation. Pancreas: Unremarkable. No pancreatic ductal dilatation or surrounding inflammatory changes. Spleen: Normal in size without significant abnormality. Adrenals/Urinary Tract: Adrenal glands are unremarkable. Atrophic kidneys. No calculi or hydronephrosis. Multiple bilateral exophytic renal lesions of varying attenuation, several of intermediate soft tissue attenuation and not clearly simple cysts, including of the superior pole of the left kidney (series 2, image 40), the lateral midportion of the left kidney (series 2, image 46), in the inferior pole of the right kidney (series 2, image 58). These are generally unchanged compared to prior examination however incompletely characterized. Dense metallic streak artifact from bilateral hip total arthroplasty limits evaluation of the low pelvis. No obvious abnormality of the bladder. Stomach/Bowel: Stomach is within normal limits. Appendix appears normal. No evidence of bowel wall thickening, distention, or inflammatory changes. Descending and sigmoid diverticulosis. Vascular/Lymphatic: No significant vascular findings are present. No enlarged abdominal or pelvic lymph nodes. Reproductive: Dense metallic streak artifact from bilateral hip total arthroplasty limits evaluation of the low pelvis. No obvious abnormality. Other: No abdominal wall hernia or abnormality. No ascites. Musculoskeletal: Status post bilateral hip total arthroplasty. Disc degenerative disease and bridging osteophytosis throughout the lower thoracic and lumbar spine, in keeping with DISH. IMPRESSION: 1. Moderate volume ascites. 2. Large right, small left pleural effusions.  Anasarca. 3. Descending and  sigmoid diverticulosis without evidence of acute diverticulitis. 4. Cholelithiasis without evidence of acute cholecystitis. 5. Status post bilateral hip total arthroplasty without obvious acute abnormality to explain hip pain. 6. Cardiomegaly and coronary artery disease. 7. Multiple bilateral exophytic renal lesions of varying attenuation, several of intermediate soft tissue attenuation and not clearly simple cysts. These are generally unchanged compared to prior examination and most likely hemorrhagic or proteinaceous cysts, however solid mass is not excluded. Initial renal ultrasound can be performed on a nonemergent, outpatient basis to ensure benign cystic character. Aortic Atherosclerosis (ICD10-I70.0). Electronically Signed   By: Delanna Ahmadi M.D.   On: 12/04/2021 15:11   CT HEAD WO CONTRAST (5MM)  Result Date: 12/04/2021 CLINICAL DATA:  Provided history: Mental status change, unknown cause. EXAM: CT HEAD WITHOUT CONTRAST TECHNIQUE: Contiguous axial images were obtained from the base of the skull through the vertex without intravenous contrast. RADIATION DOSE REDUCTION: This exam was performed according to the departmental dose-optimization program which includes automated exposure control, adjustment of the mA and/or kV according to patient size and/or use of iterative reconstruction technique. COMPARISON:  CT 02/20/2021. FINDINGS: Brain: Frontal lobe predominant cerebral atrophy. Mild patchy and ill-defined hypoattenuation within the cerebral white matter, nonspecific but compatible with chronic small vessel ischemic disease.  Mineralization within the bilateral basal ganglia. There is no acute intracranial hemorrhage. No demarcated cortical infarct. No extra-axial fluid collection. No evidence of an intracranial mass. No midline shift. Vascular: No hyperdense vessel.  Atherosclerotic calcifications. Skull: No fracture or aggressive osseous lesion. Sinuses/Orbits: Mild mucosal thickening within the  bilateral frontal and ethmoid sinuses. Fluid level, and background mucosal thickening, within the right sphenoid sinus. Trace mucosal thickening within the right maxillary sinus at the imaged levels. Other: Bilateral mastoid effusions. IMPRESSION: No evidence of acute intracranial abnormality. Mild chronic small vessel ischemic changes within the cerebral white matter. Frontal lobe predominant cerebral atrophy. Paranasal sinus disease at the imaged levels, as described. Bilateral mastoid effusions. Electronically Signed   By: Kellie Simmering D.O.   On: 12/04/2021 11:32   DG Chest Portable 1 View  Result Date: 12/04/2021 CLINICAL DATA:  Altered mental status, weakness EXAM: PORTABLE CHEST 1 VIEW COMPARISON:  Previous studies including the examination of 06/05/2021 FINDINGS: Transverse diameter of heart is increased. There is interval appearance of moderate right pleural effusion. Possibility of underlying infiltrates is not excluded. Central pulmonary vessels are prominent which may be due to poor inspiration. Left lateral CP angle is clear. There is no pneumothorax. IMPRESSION: Cardiomegaly. Moderate right pleural effusion. Possibility of underlying atelectasis/pneumonia is not excluded. Electronically Signed   By: Elmer Picker M.D.   On: 12/04/2021 11:17        Scheduled Meds:  atorvastatin  40 mg Oral QHS   Chlorhexidine Gluconate Cloth  6 each Topical Q0600   clopidogrel  75 mg Oral Q breakfast   diclofenac Sodium  2 g Topical Daily   folic acid  1 mg Oral Daily   heparin  5,000 Units Subcutaneous Q8H   metoprolol tartrate  12.5 mg Oral BID   midodrine  5 mg Oral TID WC   sertraline  50 mg Oral Daily   sodium chloride flush  3 mL Intravenous Q12H   Continuous Infusions:  Assessment & Plan:   Principal Problem:   Acute encephalopathy Active Problems:   Hypotension   Pleural effusion on right   Hematuria   ESRD (end stage renal disease) (HCC)   Pressure injury of skin   Atrial  fibrillation, chronic (HCC)   Anemia of chronic disease   Thrombocytopenia (HCC)   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   CT abdomen/pelvis demonstrated large right pleural effusion. See full result. Continue with HD IR was consulted for thoracentesis     Hematuria On arrival to the ED, urinalysis was obtained via in and out; on catheter placement, gross hematuria drained into Foley.  Given multiple renal cysts noted on CT abdomen/pelvis, will need to rule out renal cell carcinoma.  Renal ultrasound did not reveal much.  Recommended MRI.  Can do this as outpatient      ESRD (end stage renal disease) [facility]) Nephrology following Getting dialysis   Pressure injury of skin On arrival to the ED, patient was noted to have widespread stage I pressure ulcers of bilateral gluteal region.  No evidence of ulcerations but patient experiencing  associated pain.  -Wound care consult   Atrial fibrillation, chronic (HCC) No evidence of RVR at this time.  Per [facility] records, patient is no longer on anticoagulation. Resume home beta-blockers if BP allowable   Anemia of chronic disease H&H stable   Thrombocytopenia (HCC) Chronic thrombocytopenia previously evaluated by hematology in the past.   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  Diffuse anasarca noted on examination, however I suspect this is more likely in the  setting of missed dialysis rather than decompensated heart failure.   Treatment as above   DVT prophylaxis: Heparin Code Status: DNR Family Communication: None at bedside Disposition Plan:  Status is: Observation The patient remains OBS appropriate and will d/c before 2 midnights.        LOS: 0 days   Time spent: 35 minutes      Nolberto Hanlon, MD [facility] Pager [phone]  If 7PM-7AM, please contact night-coverage 12/05/2021, 3:45 PM

## 2021-12-05 NOTE — Progress Notes (Signed)
No family inform

## 2021-12-05 NOTE — ED Notes (Signed)
Attempted for IV at this time with no success.

## 2021-12-05 NOTE — ED Notes (Signed)
Advised nurse that patient has ready bed 

## 2021-12-05 NOTE — Progress Notes (Signed)
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  Patient was recently admitted to [facility] and discharged to SNF.  It seems like patient refused most recent dialysis on 12/02/2021.  Upon admission he has complained of gluteal pain as well as some shortness of breath.   Patient is known to our practice and was receiving outpatient dialysis at [facility] with TTS schedule.   Patient was seen and examined at bedside in the ED. he is awake, alert and able to follow simple commands.  Giving oxygen through nasal cannula.  Patient did not complete dialysis treatment yesterday.  Apparently, he refused to continue after 1 hour of treatment.  Will plan for hemodialysis treatment again tomorrow.    Objective:  Vital signs in last 24 hours:  Temp:  [97.5 F (36.4 C)-97.9 F (36.6 C)] 97.6 F (36.4 C) (10/01 1128) Pulse Rate:  [35-102] 79 (10/01 1130) Resp:  [10-21] 21 (10/01 1130) BP: (82-114)/(44-79) 103/76 (10/01 1130) SpO2:  [94 %-100 %] 94 % (10/01 1130)  Weight change:  Filed Weights   12/04/21 1044  Weight: 100.9 kg    Intake/Output: I/O last 3 completed shifts: In: -  Out: 200 [Other:200]   Intake/Output this shift:  No intake/output data recorded.  Physical Exam: General: Chronically ill-appearing  Head: Normocephalic, atraumatic. Moist oral mucosal membranes  Neck: Supple  Lungs:  Clear to auscultation, normal effort  Heart: S1S2 no rubs  Abdomen:  Soft, nontender, bowel sounds present  Extremities: Edema 1+, right BKA , left AKA  Neurologic: Awake, alert, following commands  Skin: No acute rash  Access: Lt AVF     Basic Metabolic Panel: Recent Labs  Lab 12/04/21 1054 12/05/21 0919  NA 141 141  K 3.9 4.2  CL 114* 102  CO2 17* 26  GLUCOSE 71 96  BUN 39* 44*  CREATININE 4.19* 5.21*  CALCIUM 7.1* 9.3  PHOS  --  4.4    Liver Function Tests: Recent Labs  Lab 12/04/21 1054 12/05/21 0919  AST 16 16  ALT 6 9  ALKPHOS 56 75  BILITOT 1.6* 1.6*  PROT 4.5* 6.2*  ALBUMIN 2.5* 3.4*   No results for input(s): "LIPASE", "AMYLASE" in the last 168 hours. No results for input(s): "AMMONIA" in the last 168 hours.  CBC: Recent Labs  Lab 12/04/21 1054 12/05/21 0919  WBC 8.3 7.1  NEUTROABS 6.1 5.1  HGB 11.8* 11.2*  HCT 41.3 37.6*  MCV 109.0* 105.0*  PLT 85* 70*    Cardiac Enzymes: No results for input(s): "CKTOTAL", "CKMB", "CKMBINDEX", "TROPONINI" in the last 168 hours.  BNP: Invalid input(s): "POCBNP"  CBG: No results for input(s): "GLUCAP" in the last 168 hours.  Microbiology: Results for orders placed or performed during the hospital encounter of 12/04/21  Culture, blood (Routine x 2)     Status: None (Preliminary result)   Collection Time: 12/04/21 10:54 AM   Specimen: Right Antecubital; Blood  Result Value Ref Range Status   Specimen Description RIGHT ANTECUBITAL  Final   Special Requests   Final    BOTTLES DRAWN AEROBIC AND ANAEROBIC Blood Culture adequate volume   Culture   Final    NO GROWTH <  24 HOURS Performed at [facility], [address] [place], [place] [address]    Report Status PENDING  Incomplete  SARS Coronavirus 2 by RT PCR (hospital order, performed in [facility] hospital lab) *cepheid single result test* Anterior Nasal Swab     Status: None   Collection Time: 12/04/21 11:44 AM   Specimen: Anterior Nasal Swab  Result Value Ref Range Status   SARS Coronavirus 2 by RT PCR NEGATIVE NEGATIVE Final    Comment: (NOTE) SARS-CoV-2 target nucleic acids are NOT DETECTED.  The SARS-CoV-2 RNA is generally detectable in upper and lower respiratory  specimens during the acute phase of infection. The lowest concentration of SARS-CoV-2 viral copies this assay can detect is 250 copies / mL. A negative result does not preclude SARS-CoV-2 infection and should not be used as the sole basis for treatment or other patient management decisions.  A negative result may occur with improper specimen collection / handling, submission of specimen other than nasopharyngeal swab, presence of viral mutation(s) within the areas targeted by this assay, and inadequate number of viral copies (<250 copies / mL). A negative result must be combined with clinical observations, patient history, and epidemiological information.  Fact Sheet for Patients:   https://www.patel.info/  Fact Sheet for Healthcare Providers: https://hall.com/  This test is not yet approved or  cleared by the [place] FDA and has been authorized for detection and/or diagnosis of SARS-CoV-2 by FDA under an Emergency Use Authorization (EUA).  This EUA will remain in effect (meaning this test can be used) for the duration of the COVID-19 declaration under Section 564(b)(1) of the Act, 21 U.S.C. section 360bbb-3(b)(1), unless the authorization is terminated or revoked sooner.  Performed at [facility], [address] [place], [place] [address]     Coagulation Studies: Recent Labs    12/05/21 0919  LABPROT 17.0*  INR 1.4*    Urinalysis: Recent Labs    12/04/21 1316  COLORURINE RED*  LABSPEC 1.012  PHURINE TEST NOT REPORTED DUE TO COLOR INTERFERENCE OF URINE PIGMENT  GLUCOSEU TEST NOT REPORTED DUE TO COLOR INTERFERENCE OF URINE PIGMENT*  HGBUR TEST NOT REPORTED DUE TO COLOR INTERFERENCE OF URINE PIGMENT*  BILIRUBINUR TEST NOT REPORTED DUE TO COLOR INTERFERENCE OF URINE PIGMENT*  KETONESUR TEST NOT REPORTED DUE TO COLOR INTERFERENCE OF URINE PIGMENT*  PROTEINUR TEST NOT REPORTED DUE TO COLOR INTERFERENCE OF URINE  PIGMENT*  NITRITE TEST NOT REPORTED DUE TO COLOR INTERFERENCE OF URINE PIGMENT*  LEUKOCYTESUR TEST NOT REPORTED DUE TO COLOR INTERFERENCE OF URINE PIGMENT*      Imaging: US RENAL  Result Date: 12/04/2021 CLINICAL DATA:  Hematuria. EXAM: RENAL / URINARY TRACT ULTRASOUND COMPLETE COMPARISON:  Same day CT abdomen pelvis at 1439 hours FINDINGS: Right Kidney: Renal measurements: 9.8 x 5.7 x 5.1 cm = volume: 151 mL. Diffuse cortical thinning and increased echogenicity. No hydronephrosis visualized. A 2.6 cm cortical cyst is noted at the lower pole of the right kidney Left Kidney: Renal measurements: 11.4 x 6.4 x 6.6 cm = volume: 151 mL. Diffuse cortical thinning and increased echogenicity. No hydronephrosis visualized. A 3.5 cm cortical cyst is noted at the middle third of the left kidney. Bladder: Appears normal for degree of bladder distention. Other: Free fluid is noted in the right upper quadrant. Right pleural effusion is noted. Cholelithiasis. Gallbladder wall measures 0.4 cm in thickness. Multiple echogenic foci within the spleen correspond to vascular calcifications seen on same day CT abdomen pelvis. IMPRESSION: 1. Atrophic kidneys,  consistent with chronic medical renal disease. 2. Single bilateral benign renal cortical cysts are noted. The other multiple bilateral exophytic renal lesions described on same day CT abdomen pelvis are not visualized as the exam is technically challenging secondary to patient body habitus and immobility. If further evaluation is clinically desired, abdominal MRI could be performed when the patient is clinically stable and able to follow directions and hold their breath (preferably as an outpatient). 3. Right upper quadrant ascites and right pleural effusion, corresponding to findings on same day CT abdomen pelvis. 4. Cholelithiasis. Gallbladder wall thickening is favored to represent sequela of fluid third-spacing as the gallbladder is minimally distended. Electronically  Signed   By: Ileana Roup M.D.   On: 12/04/2021 21:02   CT ABDOMEN PELVIS WO CONTRAST  Result Date: 12/04/2021 CLINICAL DATA:  Lower abdominal, buttock, and hip pain EXAM: CT ABDOMEN AND PELVIS WITHOUT CONTRAST TECHNIQUE: Multidetector CT imaging of the abdomen and pelvis was performed following the standard protocol without IV contrast. RADIATION DOSE REDUCTION: This exam was performed according to the departmental dose-optimization program which includes automated exposure control, adjustment of the mA and/or kV according to patient size and/or use of iterative reconstruction technique. COMPARISON:  CT chest abdomen pelvis, 02/20/2021 FINDINGS: Lower chest: Cardiomegaly. Three-vessel coronary artery calcifications. Aortic valve calcifications. Hepatobiliary: No solid liver abnormality is seen. Gallstones contracted in the gallbladder. No gallbladder wall thickening, or biliary dilatation. Pancreas: Unremarkable. No pancreatic ductal dilatation or surrounding inflammatory changes. Spleen: Normal in size without significant abnormality. Adrenals/Urinary Tract: Adrenal glands are unremarkable. Atrophic kidneys. No calculi or hydronephrosis. Multiple bilateral exophytic renal lesions of varying attenuation, several of intermediate soft tissue attenuation and not clearly simple cysts, including of the superior pole of the left kidney (series 2, image 40), the lateral midportion of the left kidney (series 2, image 46), in the inferior pole of the right kidney (series 2, image 58). These are generally unchanged compared to prior examination however incompletely characterized. Dense metallic streak artifact from bilateral hip total arthroplasty limits evaluation of the low pelvis. No obvious abnormality of the bladder. Stomach/Bowel: Stomach is within normal limits. Appendix appears normal. No evidence of bowel wall thickening, distention, or inflammatory changes. Descending and sigmoid diverticulosis.  Vascular/Lymphatic: No significant vascular findings are present. No enlarged abdominal or pelvic lymph nodes. Reproductive: Dense metallic streak artifact from bilateral hip total arthroplasty limits evaluation of the low pelvis. No obvious abnormality. Other: No abdominal wall hernia or abnormality. No ascites. Musculoskeletal: Status post bilateral hip total arthroplasty. Disc degenerative disease and bridging osteophytosis throughout the lower thoracic and lumbar spine, in keeping with DISH. IMPRESSION: 1. Moderate volume ascites. 2. Large right, small left pleural effusions.  Anasarca. 3. Descending and sigmoid diverticulosis without evidence of acute diverticulitis. 4. Cholelithiasis without evidence of acute cholecystitis. 5. Status post bilateral hip total arthroplasty without obvious acute abnormality to explain hip pain. 6. Cardiomegaly and coronary artery disease. 7. Multiple bilateral exophytic renal lesions of varying attenuation, several of intermediate soft tissue attenuation and not clearly simple cysts. These are generally unchanged compared to prior examination and most likely hemorrhagic or proteinaceous cysts, however solid mass is not excluded. Initial renal ultrasound can be performed on a nonemergent, outpatient basis to ensure benign cystic character. Aortic Atherosclerosis (ICD10-I70.0). Electronically Signed   By: Delanna Ahmadi M.D.   On: 12/04/2021 15:11   CT HEAD WO CONTRAST (5MM)  Result Date: 12/04/2021 CLINICAL DATA:  Provided history: Mental status change, unknown cause. EXAM: CT HEAD WITHOUT  CONTRAST TECHNIQUE: Contiguous axial images were obtained from the base of the skull through the vertex without intravenous contrast. RADIATION DOSE REDUCTION: This exam was performed according to the departmental dose-optimization program which includes automated exposure control, adjustment of the mA and/or kV according to patient size and/or use of iterative reconstruction technique.  COMPARISON:  CT 02/20/2021. FINDINGS: Brain: Frontal lobe predominant cerebral atrophy. Mild patchy and ill-defined hypoattenuation within the cerebral white matter, nonspecific but compatible with chronic small vessel ischemic disease. Mineralization within the bilateral basal ganglia. There is no acute intracranial hemorrhage. No demarcated cortical infarct. No extra-axial fluid collection. No evidence of an intracranial mass. No midline shift. Vascular: No hyperdense vessel.  Atherosclerotic calcifications. Skull: No fracture or aggressive osseous lesion. Sinuses/Orbits: Mild mucosal thickening within the bilateral frontal and ethmoid sinuses. Fluid level, and background mucosal thickening, within the right sphenoid sinus. Trace mucosal thickening within the right maxillary sinus at the imaged levels. Other: Bilateral mastoid effusions. IMPRESSION: No evidence of acute intracranial abnormality. Mild chronic small vessel ischemic changes within the cerebral white matter. Frontal lobe predominant cerebral atrophy. Paranasal sinus disease at the imaged levels, as described. Bilateral mastoid effusions. Electronically Signed   By: Kellie Simmering D.O.   On: 12/04/2021 11:32   DG Chest Portable 1 View  Result Date: 12/04/2021 CLINICAL DATA:  Altered mental status, weakness EXAM: PORTABLE CHEST 1 VIEW COMPARISON:  Previous studies including the examination of 06/05/2021 FINDINGS: Transverse diameter of heart is increased. There is interval appearance of moderate right pleural effusion. Possibility of underlying infiltrates is not excluded. Central pulmonary vessels are prominent which may be due to poor inspiration. Left lateral CP angle is clear. There is no pneumothorax. IMPRESSION: Cardiomegaly. Moderate right pleural effusion. Possibility of underlying atelectasis/pneumonia is not excluded. Electronically Signed   By: Elmer Picker M.D.   On: 12/04/2021 11:17     Medications:     atorvastatin  40 mg  Oral QHS   Chlorhexidine Gluconate Cloth  6 each Topical Q0600   clopidogrel  75 mg Oral Q breakfast   diclofenac Sodium  2 g Topical Daily   folic acid  1 mg Oral Daily   heparin  5,000 Units Subcutaneous Q8H   metoprolol tartrate  12.5 mg Oral BID   midodrine  5 mg Oral TID WC   sertraline  50 mg Oral Daily   sodium chloride flush  3 mL Intravenous Q12H   acetaminophen **OR** acetaminophen, alteplase, heparin, lidocaine (PF), lidocaine-prilocaine, pentafluoroprop-tetrafluoroeth, polyethylene glycol  Assessment/ Plan:  76 y.o. male with a past medical history significant for ESRD, chronic hypoxic respiratory failure on 2 L nasal cannula oxygen, CAD status post DES, T2DM, permanent A-fib, PAD status post right BKA and left BKA, prostate carcinoma admitted to [facility] with acute encephalopathy with disorientation and lethargic.  Patient also noted to be lethargic.  He also missed recent dialysis treatment prior to the admission.  We were consulted to manage the dialysis needs during this admission.  End-stage renal disease on hemodialysis TTS schedule: Patient refused to complete hemodialysis treatment yesterday.  We will plan for another dialysis treatment tomorrow.  We will continue to follow-up on labs.  Anemia of chronic disease: Patient's latest hemoglobin is 11.2.  We will follow-up H&H.   Acute encephalopathy likely secondary to missed dialysis/medications: Will continue with dialysis treatment as mentioned above. Holding medications that could cause encephalopathy.  Continue to monitor mentation.   Secondary hyperparathyroidism: Calcium and phosphorus at target goal.  Follow-up bone minerals  Hypotension: Continue midodrine 3 times daily.  Blood pressure closely   LOS: 0 Keona Bilyeu P Haizlee Henton 10/1/20231:50 PM

## 2021-12-05 NOTE — ED Notes (Signed)
Barrier cream applied to patient bottom as requested by patient.

## 2021-12-05 NOTE — ED Notes (Signed)
Patient's breakfast tray set up for patient to eat at this time.

## 2021-12-05 NOTE — ED Notes (Signed)
IV team unable to get IV placed at this time. Lab called to get AM labs. Message sent to Kurtis Bushman, MD at this time.

## 2021-12-06 ENCOUNTER — Inpatient Hospital Stay: Payer: No Typology Code available for payment source

## 2021-12-06 DIAGNOSIS — D631 Anemia in chronic kidney disease: Secondary | ICD-10-CM | POA: Diagnosis not present

## 2021-12-06 DIAGNOSIS — E872 Acidosis, unspecified: Secondary | ICD-10-CM | POA: Diagnosis present

## 2021-12-06 DIAGNOSIS — J9611 Chronic respiratory failure with hypoxia: Secondary | ICD-10-CM | POA: Diagnosis present

## 2021-12-06 DIAGNOSIS — Z66 Do not resuscitate: Secondary | ICD-10-CM | POA: Diagnosis present

## 2021-12-06 DIAGNOSIS — G9341 Metabolic encephalopathy: Secondary | ICD-10-CM | POA: Diagnosis present

## 2021-12-06 DIAGNOSIS — I959 Hypotension, unspecified: Secondary | ICD-10-CM | POA: Diagnosis not present

## 2021-12-06 DIAGNOSIS — Z89511 Acquired absence of right leg below knee: Secondary | ICD-10-CM | POA: Diagnosis not present

## 2021-12-06 DIAGNOSIS — N2581 Secondary hyperparathyroidism of renal origin: Secondary | ICD-10-CM | POA: Diagnosis not present

## 2021-12-06 DIAGNOSIS — I9589 Other hypotension: Secondary | ICD-10-CM | POA: Diagnosis present

## 2021-12-06 DIAGNOSIS — G9340 Encephalopathy, unspecified: Secondary | ICD-10-CM | POA: Diagnosis not present

## 2021-12-06 DIAGNOSIS — Z79899 Other long term (current) drug therapy: Secondary | ICD-10-CM | POA: Diagnosis not present

## 2021-12-06 DIAGNOSIS — L89321 Pressure ulcer of left buttock, stage 1: Secondary | ICD-10-CM | POA: Diagnosis present

## 2021-12-06 DIAGNOSIS — I4821 Permanent atrial fibrillation: Secondary | ICD-10-CM | POA: Diagnosis present

## 2021-12-06 DIAGNOSIS — Z1152 Encounter for screening for COVID-19: Secondary | ICD-10-CM | POA: Diagnosis not present

## 2021-12-06 DIAGNOSIS — I132 Hypertensive heart and chronic kidney disease with heart failure and with stage 5 chronic kidney disease, or end stage renal disease: Secondary | ICD-10-CM | POA: Diagnosis present

## 2021-12-06 DIAGNOSIS — K5900 Constipation, unspecified: Secondary | ICD-10-CM | POA: Diagnosis not present

## 2021-12-06 DIAGNOSIS — J90 Pleural effusion, not elsewhere classified: Secondary | ICD-10-CM | POA: Diagnosis not present

## 2021-12-06 DIAGNOSIS — Z992 Dependence on renal dialysis: Secondary | ICD-10-CM | POA: Diagnosis not present

## 2021-12-06 DIAGNOSIS — E1122 Type 2 diabetes mellitus with diabetic chronic kidney disease: Secondary | ICD-10-CM | POA: Diagnosis present

## 2021-12-06 DIAGNOSIS — D696 Thrombocytopenia, unspecified: Secondary | ICD-10-CM | POA: Diagnosis present

## 2021-12-06 DIAGNOSIS — Z6841 Body Mass Index (BMI) 40.0 and over, adult: Secondary | ICD-10-CM | POA: Diagnosis not present

## 2021-12-06 DIAGNOSIS — G928 Other toxic encephalopathy: Secondary | ICD-10-CM | POA: Diagnosis present

## 2021-12-06 DIAGNOSIS — I2489 Other forms of acute ischemic heart disease: Secondary | ICD-10-CM | POA: Diagnosis present

## 2021-12-06 DIAGNOSIS — E1151 Type 2 diabetes mellitus with diabetic peripheral angiopathy without gangrene: Secondary | ICD-10-CM | POA: Diagnosis present

## 2021-12-06 DIAGNOSIS — I5022 Chronic systolic (congestive) heart failure: Secondary | ICD-10-CM | POA: Diagnosis present

## 2021-12-06 DIAGNOSIS — Z743 Need for continuous supervision: Secondary | ICD-10-CM | POA: Diagnosis not present

## 2021-12-06 DIAGNOSIS — N186 End stage renal disease: Secondary | ICD-10-CM | POA: Diagnosis not present

## 2021-12-06 DIAGNOSIS — L89311 Pressure ulcer of right buttock, stage 1: Secondary | ICD-10-CM | POA: Diagnosis present

## 2021-12-06 DIAGNOSIS — E785 Hyperlipidemia, unspecified: Secondary | ICD-10-CM | POA: Diagnosis present

## 2021-12-06 LAB — GLUCOSE, PLEURAL OR PERITONEAL FLUID: Glucose, Fluid: 104 mg/dL

## 2021-12-06 LAB — BODY FLUID CELL COUNT WITH DIFFERENTIAL
Eos, Fluid: 0 %
Lymphs, Fluid: 7 %
Monocyte-Macrophage-Serous Fluid: 77 %
Neutrophil Count, Fluid: 16 %
Total Nucleated Cell Count, Fluid: 130 cu mm

## 2021-12-06 LAB — HEPATITIS B SURFACE ANTIBODY, QUANTITATIVE: Hep B S AB Quant (Post): <3.1 m[IU]/mL — ABNORMAL LOW (ref >9.9)

## 2021-12-06 LAB — PROTEIN, PLEURAL OR PERITONEAL FLUID: Total protein, fluid: <3 g/dL

## 2021-12-06 LAB — LACTATE DEHYDROGENASE, PLEURAL OR PERITONEAL FLUID: LD, Fluid: 30 U/L — ABNORMAL HIGH (ref 3–23)

## 2021-12-06 MED ORDER — ORAL CARE MOUTH RINSE: 15.0000 mL | OROMUCOSAL | Status: DC | PRN

## 2021-12-06 NOTE — Procedures (Signed)
Ultrasound-guided diagnostic and therapeutic right sided thoracentesis performed yielding 700 mililiters of straw colored fluid. No immediate complications.   Diagnostic fluid was sent to the lab for further analysis. Follow-up chest x-ray pending. EBL is < 2 ml. Procedure terminated due to patient's inability to tolerate procedure.

## 2021-12-06 NOTE — Progress Notes (Signed)
Patient had some hematuria this morning. Informed NP Foust. No new orders. Will continue to administer heparin sq per NP orders.

## 2021-12-06 NOTE — Progress Notes (Signed)
Established hemodialysis patient known at [facility] TTS 11:40am.

## 2021-12-06 NOTE — Progress Notes (Signed)
PROGRESS NOTE    Antonio Phillips  OQH:[id number] DOB: [birth year] DOA: 12/04/2021 PCP: Marsh Dolly, MD    Brief Narrative:  Antonio Phillips is a 76 y.o. male with medical history significant of ESRD, chronic hypoxic respiratory failure on 2L [employer], CAD s/p DES (09/2020), T2DM, permanent A. 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 History obtained from RN at [facility] and from patient.   10/1 MS has improved today. Had HD yesterday.  10/2 seen in HD today. Plan for IR later.  Consultants:  Nephrology   Procedures: HD, CT, renal US  Antimicrobials:      Subjective: IN HD. Not confused. With SOB, cant tell me if better  Objective: Vitals:   12/06/21 1230 12/06/21 1257 12/06/21 1259 12/06/21 1512  BP: (!) 91/55 100/61 94/63 (!) 89/62  Pulse: 73 77 81 (!) 59  Resp: 12 12 13    Temp:  98.3 F (36.8 C)    TempSrc:  Oral    SpO2: 98% 99% 98% 92%  Weight:      Height:        Intake/Output Summary (Last 24 hours) at 12/06/2021 1654 Last data filed at 12/06/2021 1500 Gross per 24 hour  Intake 120 ml  Output 1500 ml  Net -1380 ml   Filed Weights   12/05/21 2023 12/06/21 0441 12/06/21 0853  Weight: 98.5 kg 97.9 kg 96.3 kg    Examination: Calm, NAD Decrease bs b/l no wheezing Reg s1/s2 no gallop Soft benign +bs +edema b/l Aaoxox3  Mood and affect appropriate in current setting     Data Reviewed: I have personally reviewed following labs and imaging studies  CBC: Recent Labs  Lab 12/04/21 1054 12/05/21 0919  WBC 8.3 7.1  NEUTROABS 6.1 5.1  HGB 11.8* 11.2*  HCT 41.3 37.6*  MCV 109.0* 105.0*  PLT 85* 70*   Basic Metabolic Panel: Recent Labs  Lab 12/04/21 1054 12/05/21 0919  NA 141 141  K 3.9 4.2  CL 114* 102  CO2 17* 26  GLUCOSE 71 96  BUN 39* 44*  CREATININE 4.19* 5.21*  CALCIUM 7.1* 9.3  PHOS  --  4.4   GFR: Estimated Creatinine Clearance: 9.4 mL/min (A) (by C-G  formula based on SCr of 5.21 mg/dL (H)). Liver Function Tests: Recent Labs  Lab 12/04/21 1054 12/05/21 0919  AST 16 16  ALT 6 9  ALKPHOS 56 75  BILITOT 1.6* 1.6*  PROT 4.5* 6.2*  ALBUMIN 2.5* 3.4*   No results for input(s): "LIPASE", "AMYLASE" in the last 168 hours. No results for input(s): "AMMONIA" in the last 168 hours. Coagulation Profile: Recent Labs  Lab 12/05/21 0919  INR 1.4*   Cardiac Enzymes: No results for input(s): "CKTOTAL", "CKMB", "CKMBINDEX", "TROPONINI" in the last 168 hours. BNP (last 3 results) No results for input(s): "PROBNP" in the last 8760 hours. HbA1C: No results for input(s): "HGBA1C" in the last 72 hours. CBG: No results for input(s): "GLUCAP" in the last 168 hours. Lipid Profile: No results for input(s): "CHOL", "HDL", "LDLCALC", "TRIG", "CHOLHDL", "LDLDIRECT" in the last 72 hours. Thyroid Function Tests: No results for input(s): "TSH", "T4TOTAL", "FREET4", "T3FREE", "THYROIDAB" in the last 72 hours. Anemia Panel: No results for input(s): "VITAMINB12", "FOLATE", "FERRITIN", "TIBC", "IRON", "RETICCTPCT" in the last 72 hours. Sepsis Labs: Recent Labs  Lab 12/04/21 1054  LATICACIDVEN 1.4    Recent Results (from the past 240 hour(s))  Culture, blood (  Routine x 2)     Status: None (Preliminary result)   Collection Time: 12/04/21 10:54 AM   Specimen: Right Antecubital; Blood  Result Value Ref Range Status   Specimen Description RIGHT ANTECUBITAL  Final   Special Requests   Final    BOTTLES DRAWN AEROBIC AND ANAEROBIC Blood Culture adequate volume   Culture   Final    NO GROWTH < 24 HOURS Performed at [facility], [address], [place], [place] [address]    Report Status PENDING  Incomplete  SARS Coronavirus 2 by RT PCR (hospital order, performed in [place] hospital lab) *cepheid single result test* Anterior Nasal Swab     Status: None   Collection Time: 12/04/21 11:44 AM   Specimen: Anterior Nasal Swab  Result Value Ref  Range Status   SARS Coronavirus 2 by RT PCR NEGATIVE NEGATIVE Final    Comment: (NOTE) SARS-CoV-2 target nucleic acids are NOT DETECTED.  The SARS-CoV-2 RNA is generally detectable in upper and lower respiratory specimens during the acute phase of infection. The lowest concentration of SARS-CoV-2 viral copies this assay can detect is 250 copies / mL. A negative result does not preclude SARS-CoV-2 infection and should not be used as the sole basis for treatment or other patient management decisions.  A negative result may occur with improper specimen collection / handling, submission of specimen other than nasopharyngeal swab, presence of viral mutation(s) within the areas targeted by this assay, and inadequate number of viral copies (<250 copies / mL). A negative result must be combined with clinical observations, patient history, and epidemiological information.  Fact Sheet for Patients:   https://www.patel.info/  Fact Sheet for Healthcare Providers: https://hall.com/  This test is not yet approved or  cleared by the [place] FDA and has been authorized for detection and/or diagnosis of SARS-CoV-2 by FDA under an Emergency Use Authorization (EUA).  This EUA will remain in effect (meaning this test can be used) for the duration of the COVID-19 declaration under Section 564(b)(1) of the Act, 21 U.S.C. section 360bbb-3(b)(1), unless the authorization is terminated or revoked sooner.  Performed at [facility], [address] [place], [place] [address]   Urine Culture     Status: Abnormal (Preliminary result)   Collection Time: 12/04/21  1:16 PM   Specimen: In/Out Cath Urine  Result Value Ref Range Status   Specimen Description   Final    IN/OUT CATH URINE Performed at [facility], [address], [place], [place] [address]    Special Requests   Final    NONE Performed at [facility], [address] [place], [place] [address]    Culture 10,000 COLONIES/mL PROTEUS MIRABILIS (A)  Final   Report Status PENDING  Incomplete  MRSA Next Gen by PCR, Nasal     Status: Abnormal   Collection Time: 12/05/21  7:58 PM   Specimen: Nasal Mucosa; Nasal Swab  Result Value Ref Range Status   MRSA by PCR Next Gen DETECTED (A) NOT DETECTED Final    Comment: RESULT CALLED TO, READ BACK BY AND VERIFIED WITH: MARCEL TURNER 12/05/21 1020 KLW (NOTE) The GeneXpert MRSA Assay (FDA approved for NASAL specimens only), is one component of a comprehensive MRSA colonization surveillance program. It is not intended to diagnose MRSA infection nor to guide or monitor treatment for MRSA infections. Test performance is not FDA approved in patients less than 83 years old. Performed at [facility], [place], [place]  [place]          Radiology Studies: [facility] Chest Port 1 View  Result Date: 12/06/2021 CLINICAL DATA:  Status post right thoracentesis. EXAM: PORTABLE CHEST 1 VIEW COMPARISON:  December 04, 2021. FINDINGS: No pneumothorax status post right-sided thoracentesis. Right pleural effusion is smaller. IMPRESSION: No pneumothorax status post right-sided thoracentesis. Electronically Signed   By: Marijo Conception M.D.   On: 12/06/2021 15:45   US THORACENTESIS ASP PLEURAL SPACE W/IMG GUIDE  Result Date: 12/06/2021 INDICATION: Patient with history of end-stage renal disease, chronic hypoxic respiratory failure, AFib found to be hypotensive with pleural effusion. Request is for thoracentesis EXAM: ULTRASOUND GUIDED therapeutic and diagnostic right-sided THORACENTESIS MEDICATIONS: Lidocaine 1% 10 mL COMPLICATIONS: None immediate. PROCEDURE: An ultrasound guided thoracentesis was thoroughly discussed with the patient and questions answered. The benefits, risks, alternatives and complications were also discussed. The patient understands and wishes to proceed with the procedure. Written consent  was obtained. Ultrasound was performed to localize and mark an adequate pocket of fluid in the right chest. The area was then prepped and draped in the normal sterile fashion. 1% Lidocaine was used for local anesthesia. Under ultrasound guidance a 6 Fr Safe-T-Centesis catheter was introduced. Thoracentesis was performed. The catheter was removed and a dressing applied. FINDINGS: A total of approximately 700 mL of straw-colored fluid was removed. Samples were sent to the laboratory as requested by the clinical team. Procedure terminated due to patient's inability to tolerate additional fluid removal IMPRESSION: Successful ultrasound guided therapeutic and diagnostic right-sided thoracentesis yielding 700 mL of pleural fluid. Read by: Rushie Nyhan, NP Electronically Signed   By: Albin Felling M.D.   On: 12/06/2021 15:44   US RENAL  Result Date: 12/04/2021 CLINICAL DATA:  Hematuria. EXAM: RENAL / URINARY TRACT ULTRASOUND COMPLETE COMPARISON:  Same day CT abdomen pelvis at 1439 hours FINDINGS: Right Kidney: Renal measurements: 9.8 x 5.7 x 5.1 cm = volume: 151 mL. Diffuse cortical thinning and increased echogenicity. No hydronephrosis visualized. A 2.6 cm cortical cyst is noted at the lower pole of the right kidney Left Kidney: Renal measurements: 11.4 x 6.4 x 6.6 cm = volume: 151 mL. Diffuse cortical thinning and increased echogenicity. No hydronephrosis visualized. A 3.5 cm cortical cyst is noted at the middle third of the left kidney. Bladder: Appears normal for degree of bladder distention. Other: Free fluid is noted in the right upper quadrant. Right pleural effusion is noted. Cholelithiasis. Gallbladder wall measures 0.4 cm in thickness. Multiple echogenic foci within the spleen correspond to vascular calcifications seen on same day CT abdomen pelvis. IMPRESSION: 1. Atrophic kidneys, consistent with chronic medical renal disease. 2. Single bilateral benign renal cortical cysts are noted. The other  multiple bilateral exophytic renal lesions described on same day CT abdomen pelvis are not visualized as the exam is technically challenging secondary to patient body habitus and immobility. If further evaluation is clinically desired, abdominal MRI could be performed when the patient is clinically stable and able to follow directions and hold their breath (preferably as an outpatient). 3. Right upper quadrant ascites and right pleural effusion, corresponding to findings on same day CT abdomen pelvis. 4. Cholelithiasis. Gallbladder wall thickening is favored to represent sequela of fluid third-spacing as the gallbladder is minimally distended. Electronically Signed   By: Ileana Roup M.D.   On: 12/04/2021 21:02        Scheduled Meds:  atorvastatin  40 mg Oral QHS   Chlorhexidine Gluconate Cloth  6 each Topical Q0600  clopidogrel  75 mg Oral Q breakfast   diclofenac Sodium  2 g Topical Daily   folic acid  1 mg Oral Daily   heparin  5,000 Units Subcutaneous Q8H   metoprolol tartrate  12.5 mg Oral BID   midodrine  5 mg Oral TID WC   mupirocin ointment  1 Application Nasal BID   sertraline  50 mg Oral Daily   sodium chloride flush  3 mL Intravenous Q12H   Continuous Infusions:  Assessment & Plan:   Principal Problem:   Acute encephalopathy Active Problems:   Hypotension   Pleural effusion on right   Hematuria   ESRD (end stage renal disease) (HCC)   Pressure injury of skin   Atrial fibrillation, chronic (HCC)   Anemia of chronic disease   Thrombocytopenia (HCC)   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   CT abdomen/pelvis demonstrated large right pleural effusion. See full result. 10/2 IN HD today IR after HD       Hematuria On arrival to the ED, urinalysis was obtained via in and out; on catheter placement, gross hematuria drained into Foley.  Given multiple renal cysts noted on CT abdomen/pelvis, will need to rule out renal cell carcinoma.  Renal ultrasound did not reveal much.  Recommended MRI.  Can do this as outpatient 10/2 need to setup with urology as outpatient, but will discuss with urology      ESRD (end stage renal disease) [facility]) Nephrology following 10/2 in HD   Pressure injury of skin On arrival to the ED, patient was noted to have widespread stage I pressure ulcers of bilateral gluteal region.  No evidence of ulcerations but patient experiencing associated pain. 10/2 wound care consulted   Atrial fibrillation, chronic (HCC) No evidence of RVR at this time.  Per [facility] records, patient is no longer on anticoagulation. 10/2 resume beta blk when bp allows    Anemia of chronic disease H&H stable   Thrombocytopenia (HCC) Chronic thrombocytopenia previously evaluated by hematology in the past.   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  Diffuse anasarca noted on examination, however I suspect this is more likely in the setting of missed dialysis rather than decompensated heart failure.   Treatment as above   DVT prophylaxis: heparin>>>>but had hematuria Code Status: DNR Family Communication: None at bedside Disposition Plan:  Status is: inpatient  The patient remains inpatient as he requires IV treatment, having IR procedure, getting HD today         LOS: 0 days   Time spent: 35 minutes      Nolberto Hanlon, MD [facility] Pager [phone]  [phone]  If 7PM-7AM, please contact night-coverage 12/06/2021, 4:54 PM

## 2021-12-06 NOTE — Progress Notes (Signed)
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  Patient was recently admitted to [facility] and discharged to SNF.  It seems like patient refused most recent dialysis on 12/02/2021.  Upon admission he has complained of gluteal pain as well as some shortness of breath.   Patient is known to our practice and was receiving outpatient dialysis at [facility] with TTS schedule.   Patient seen during dialysis Tolerating well    HEMODIALYSIS FLOWSHEET:  Blood Flow Rate (mL/min): 400 mL/min Arterial Pressure (mmHg): -210 mmHg Venous Pressure (mmHg): 180 mmHg TMP (mmHg): -1 mmHg Ultrafiltration Rate (mL/min): 606 mL/min Dialysate Flow Rate (mL/min): 300 ml/min Dialysis Fluid Bolus: Normal Saline    Objective:  Vital signs in last 24 hours:  Temp:  [97.4 F (36.3 C)-98.3 F (36.8 C)] 98.3 F (36.8 C) (10/02 1257) Pulse Rate:  [64-108] 81 (10/02 1259) Resp:  [11-19] 13 (10/02 1259) BP: (83-107)/(47-66) 94/63 (10/02 1259) SpO2:  [94 %-100 %] 98 % (10/02 1259) Weight:  [96.3 kg-98.5 kg] 96.3 kg (10/02 0853)  Weight change: -2.4 kg Filed Weights   12/05/21 2023 12/06/21 0441 12/06/21 0853  Weight: 98.5 kg 97.9 kg 96.3 kg    Intake/Output: No intake/output data recorded.   Intake/Output this shift:  Total I/O In: -  Out: 1500 [Other:1500]  Physical Exam: General: Chronically ill-appearing  Head: Normocephalic, atraumatic. Moist oral mucosal membranes  Neck: Supple  Lungs:  Clear to auscultation, normal effort  Heart: S1S2 no rubs, irregular A Fib  Abdomen:  Soft, nontender, bowel sounds present  Extremities: Edema 1+, right  BKA , left AKA  Neurologic: Resting quietly  Skin: No acute rash  Access: Lt AVF    Basic Metabolic Panel: Recent Labs  Lab 12/04/21 1054 12/05/21 0919  NA 141 141  K 3.9 4.2  CL 114* 102  CO2 17* 26  GLUCOSE 71 96  BUN 39* 44*  CREATININE 4.19* 5.21*  CALCIUM 7.1* 9.3  PHOS  --  4.4     Liver Function Tests: Recent Labs  Lab 12/04/21 1054 12/05/21 0919  AST 16 16  ALT 6 9  ALKPHOS 56 75  BILITOT 1.6* 1.6*  PROT 4.5* 6.2*  ALBUMIN 2.5* 3.4*    No results for input(s): "LIPASE", "AMYLASE" in the last 168 hours. No results for input(s): "AMMONIA" in the last 168 hours.  CBC: Recent Labs  Lab 12/04/21 1054 12/05/21 0919  WBC 8.3 7.1  NEUTROABS 6.1 5.1  HGB 11.8* 11.2*  HCT 41.3 37.6*  MCV 109.0* 105.0*  PLT 85* 70*     Cardiac Enzymes: No results for input(s): "CKTOTAL", "CKMB", "CKMBINDEX", "TROPONINI" in the last 168 hours.  BNP: Invalid input(s): "POCBNP"  CBG: No results for input(s): "GLUCAP" in the last 168 hours.  Microbiology: Results for orders placed or performed during the hospital encounter of 12/04/21  Culture, blood (Routine x 2)     Status: None (Preliminary result)   Collection Time: 12/04/21 10:54 AM   Specimen: Right Antecubital; Blood  Result Value Ref Range Status   Specimen Description RIGHT ANTECUBITAL  Final   Special Requests   Final    BOTTLES DRAWN AEROBIC AND ANAEROBIC Blood  Culture adequate volume   Culture   Final    NO GROWTH < 24 HOURS Performed at [facility], [address] [place], [place] [address]    Report Status PENDING  Incomplete  SARS Coronavirus 2 by RT PCR (hospital order, performed in [facility] hospital lab) *cepheid single result test* Anterior Nasal Swab     Status: None   Collection Time: 12/04/21 11:44 AM   Specimen: Anterior Nasal Swab  Result Value Ref Range Status   SARS Coronavirus 2 by RT PCR NEGATIVE NEGATIVE Final    Comment: (NOTE) SARS-CoV-2 target nucleic acids are  NOT DETECTED.  The SARS-CoV-2 RNA is generally detectable in upper and lower respiratory specimens during the acute phase of infection. The lowest concentration of SARS-CoV-2 viral copies this assay can detect is 250 copies / mL. A negative result does not preclude SARS-CoV-2 infection and should not be used as the sole basis for treatment or other patient management decisions.  A negative result may occur with improper specimen collection / handling, submission of specimen other than nasopharyngeal swab, presence of viral mutation(s) within the areas targeted by this assay, and inadequate number of viral copies (<250 copies / mL). A negative result must be combined with clinical observations, patient history, and epidemiological information.  Fact Sheet for Patients:   https://www.patel.info/  Fact Sheet for Healthcare Providers: https://hall.com/  This test is not yet approved or  cleared by the [place] FDA and has been authorized for detection and/or diagnosis of SARS-CoV-2 by FDA under an Emergency Use Authorization (EUA).  This EUA will remain in effect (meaning this test can be used) for the duration of the COVID-19 declaration under Section 564(b)(1) of the Act, 21 U.S.C. section 360bbb-3(b)(1), unless the authorization is terminated or revoked sooner.  Performed at [facility], [address], [place], [place] [address]   Urine Culture     Status: Abnormal (Preliminary result)   Collection Time: 12/04/21  1:16 PM   Specimen: In/Out Cath Urine  Result Value Ref Range Status   Specimen Description   Final    IN/OUT CATH URINE Performed at [facility], [address], [place], [place] [address]    Special Requests   Final    NONE Performed at [facility], [address] [place], [place] [address]    Culture 10,000 COLONIES/mL PROTEUS MIRABILIS (A)  Final   Report Status PENDING   Incomplete  MRSA Next Gen by PCR, Nasal     Status: Abnormal   Collection Time: 12/05/21  7:58 PM   Specimen: Nasal Mucosa; Nasal Swab  Result Value Ref Range Status   MRSA by PCR Next Gen DETECTED (A) NOT DETECTED Final    Comment: RESULT CALLED TO, READ BACK BY AND VERIFIED WITH: MARCEL TURNER 12/05/21 1020 KLW (NOTE) The GeneXpert MRSA Assay (FDA approved for NASAL specimens only), is one component of a comprehensive MRSA colonization surveillance program. It is not intended to diagnose MRSA infection nor to guide or monitor treatment for MRSA infections. Test performance is not FDA approved in patients less than 69 years old. Performed at [facility], [address] [place],  [address]     Coagulation Studies: Recent Labs    12/05/21 0919  LABPROT 17.0*  INR 1.4*     Urinalysis: Recent Labs    12/04/21 1316  COLORURINE RED*  LABSPEC 1.012  PHURINE TEST NOT REPORTED DUE TO COLOR INTERFERENCE OF URINE PIGMENT  GLUCOSEU TEST NOT  REPORTED DUE TO COLOR INTERFERENCE OF URINE PIGMENT*  HGBUR TEST NOT REPORTED DUE TO COLOR INTERFERENCE OF URINE PIGMENT*  BILIRUBINUR TEST NOT REPORTED DUE TO COLOR INTERFERENCE OF URINE PIGMENT*  KETONESUR TEST NOT REPORTED DUE TO COLOR INTERFERENCE OF URINE PIGMENT*  PROTEINUR TEST NOT REPORTED DUE TO COLOR INTERFERENCE OF URINE PIGMENT*  NITRITE TEST NOT REPORTED DUE TO COLOR INTERFERENCE OF URINE PIGMENT*  LEUKOCYTESUR TEST NOT REPORTED DUE TO COLOR INTERFERENCE OF URINE PIGMENT*       Imaging: US RENAL  Result Date: 12/04/2021 CLINICAL DATA:  Hematuria. EXAM: RENAL / URINARY TRACT ULTRASOUND COMPLETE COMPARISON:  Same day CT abdomen pelvis at 1439 hours FINDINGS: Right Kidney: Renal measurements: 9.8 x 5.7 x 5.1 cm = volume: 151 mL. Diffuse cortical thinning and increased echogenicity. No hydronephrosis visualized. A 2.6 cm cortical cyst is noted at the lower pole of the right kidney Left Kidney: Renal measurements:  11.4 x 6.4 x 6.6 cm = volume: 151 mL. Diffuse cortical thinning and increased echogenicity. No hydronephrosis visualized. A 3.5 cm cortical cyst is noted at the middle third of the left kidney. Bladder: Appears normal for degree of bladder distention. Other: Free fluid is noted in the right upper quadrant. Right pleural effusion is noted. Cholelithiasis. Gallbladder wall measures 0.4 cm in thickness. Multiple echogenic foci within the spleen correspond to vascular calcifications seen on same day CT abdomen pelvis. IMPRESSION: 1. Atrophic kidneys, consistent with chronic medical renal disease. 2. Single bilateral benign renal cortical cysts are noted. The other multiple bilateral exophytic renal lesions described on same day CT abdomen pelvis are not visualized as the exam is technically challenging secondary to patient body habitus and immobility. If further evaluation is clinically desired, abdominal MRI could be performed when the patient is clinically stable and able to follow directions and hold their breath (preferably as an outpatient). 3. Right upper quadrant ascites and right pleural effusion, corresponding to findings on same day CT abdomen pelvis. 4. Cholelithiasis. Gallbladder wall thickening is favored to represent sequela of fluid third-spacing as the gallbladder is minimally distended. Electronically Signed   By: Ileana Roup M.D.   On: 12/04/2021 21:02   CT ABDOMEN PELVIS WO CONTRAST  Result Date: 12/04/2021 CLINICAL DATA:  Lower abdominal, buttock, and hip pain EXAM: CT ABDOMEN AND PELVIS WITHOUT CONTRAST TECHNIQUE: Multidetector CT imaging of the abdomen and pelvis was performed following the standard protocol without IV contrast. RADIATION DOSE REDUCTION: This exam was performed according to the departmental dose-optimization program which includes automated exposure control, adjustment of the mA and/or kV according to patient size and/or use of iterative reconstruction technique. COMPARISON:   CT chest abdomen pelvis, 02/20/2021 FINDINGS: Lower chest: Cardiomegaly. Three-vessel coronary artery calcifications. Aortic valve calcifications. Hepatobiliary: No solid liver abnormality is seen. Gallstones contracted in the gallbladder. No gallbladder wall thickening, or biliary dilatation. Pancreas: Unremarkable. No pancreatic ductal dilatation or surrounding inflammatory changes. Spleen: Normal in size without significant abnormality. Adrenals/Urinary Tract: Adrenal glands are unremarkable. Atrophic kidneys. No calculi or hydronephrosis. Multiple bilateral exophytic renal lesions of varying attenuation, several of intermediate soft tissue attenuation and not clearly simple cysts, including of the superior pole of the left kidney (series 2, image 40), the lateral midportion of the left kidney (series 2, image 46), in the inferior pole of the right kidney (series 2, image 58). These are generally unchanged compared to prior examination however incompletely characterized. Dense metallic streak artifact from bilateral hip total arthroplasty limits evaluation of the low pelvis. No obvious abnormality  of the bladder. Stomach/Bowel: Stomach is within normal limits. Appendix appears normal. No evidence of bowel wall thickening, distention, or inflammatory changes. Descending and sigmoid diverticulosis. Vascular/Lymphatic: No significant vascular findings are present. No enlarged abdominal or pelvic lymph nodes. Reproductive: Dense metallic streak artifact from bilateral hip total arthroplasty limits evaluation of the low pelvis. No obvious abnormality. Other: No abdominal wall hernia or abnormality. No ascites. Musculoskeletal: Status post bilateral hip total arthroplasty. Disc degenerative disease and bridging osteophytosis throughout the lower thoracic and lumbar spine, in keeping with DISH. IMPRESSION: 1. Moderate volume ascites. 2. Large right, small left pleural effusions.  Anasarca. 3. Descending and sigmoid  diverticulosis without evidence of acute diverticulitis. 4. Cholelithiasis without evidence of acute cholecystitis. 5. Status post bilateral hip total arthroplasty without obvious acute abnormality to explain hip pain. 6. Cardiomegaly and coronary artery disease. 7. Multiple bilateral exophytic renal lesions of varying attenuation, several of intermediate soft tissue attenuation and not clearly simple cysts. These are generally unchanged compared to prior examination and most likely hemorrhagic or proteinaceous cysts, however solid mass is not excluded. Initial renal ultrasound can be performed on a nonemergent, outpatient basis to ensure benign cystic character. Aortic Atherosclerosis (ICD10-I70.0). Electronically Signed   By: Delanna Ahmadi M.D.   On: 12/04/2021 15:11     Medications:     atorvastatin  40 mg Oral QHS   Chlorhexidine Gluconate Cloth  6 each Topical Q0600   clopidogrel  75 mg Oral Q breakfast   diclofenac Sodium  2 g Topical Daily   folic acid  1 mg Oral Daily   heparin  5,000 Units Subcutaneous Q8H   metoprolol tartrate  12.5 mg Oral BID   midodrine  5 mg Oral TID WC   mupirocin ointment  1 Application Nasal BID   sertraline  50 mg Oral Daily   sodium chloride flush  3 mL Intravenous Q12H   acetaminophen **OR** acetaminophen, mouth rinse, polyethylene glycol  Assessment/ Plan:  76 y.o. 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  Patient also noted to be lethargic.  He also missed recent dialysis treatment prior to the admission.  We were consulted to manage the dialysis needs during this admission.  End-stage renal disease on hemodialysis TTS schedule: Patient refused to complete hemodialysis treatment on Saturday.   Completed HD treatment today.  Anemia of chronic disease: Patient's latest hemoglobin is 11.2.  We will follow-up H&H.   Acute encephalopathy likely secondary to missed dialysis/medications: Will continue with dialysis treatment as mentioned above. Holding medications that could cause encephalopathy.  Continue to monitor mentation. Was calm during HD today.   Secondary hyperparathyroidism: Calcium and phosphorus at target goal.  Follow-up bone minerals  Chronic Hypotension: Continue midodrine 3 times daily.  Blood pressure closely   LOS: 0 Karli Wickizer 10/2/20232:25 PM

## 2021-12-06 NOTE — Progress Notes (Signed)
Hemodialysis Note  Received patient in bed to unit. Alert and oriented. Informed consent signed and in chart.   Treatment initiated:0857 Treatment completed:1306  Patient tolerated treatment well. Transported back to the room alert, without acute distress. Report given to patient's RN.  Access used:LUA AVF Access issues:None   Total UF removed:1499..5 Medications given:None  Post HD VS: Stable  Post HD weight:   Forrest Moron, RN [facility]

## 2021-12-07 MED ORDER — MIDODRINE HCL 10 MG PO TABS: 10.0000 mg | ORAL_TABLET | Freq: Three times a day (TID) | ORAL | 0 refills | Status: DC

## 2021-12-07 MED ORDER — FUROSEMIDE 40 MG PO TABS: 80.0000 mg | ORAL_TABLET | Freq: Two times a day (BID) | ORAL | Status: DC

## 2021-12-07 MED ORDER — MIDODRINE HCL 5 MG PO TABS
10.0000 mg | ORAL_TABLET | Freq: Three times a day (TID) | ORAL | Status: DC
  Administered 2021-12-07 (×2): 10 mg via ORAL
  Filled 2021-12-07 (×2): qty 2

## 2021-12-07 MED ORDER — MUPIROCIN 2 % EX OINT: 1.0000 | TOPICAL_OINTMENT | Freq: Two times a day (BID) | CUTANEOUS | 0 refills | Status: DC

## 2021-12-07 MED ORDER — METOPROLOL TARTRATE 25 MG PO TABS: 12.5000 mg | ORAL_TABLET | Freq: Two times a day (BID) | ORAL | Status: DC

## 2021-12-07 NOTE — Progress Notes (Signed)
Notified NP Foust of 2 episodes of hematuria, will continue to monitor. No new orders at this time.

## 2021-12-07 NOTE — NC FL2 (Signed)
[place] LEVEL OF CARE SCREENING TOOL     IDENTIFICATION  Patient Name: Antonio Phillips Birthdate: [birth year] Sex: male Admission Date (Current Location): 12/04/2021  [place] and [place] Number:  Engineering geologist and Address:  [facility], [address] [place], [place] [address]      Provider Number: [id number]  Attending Physician Name and Address:  Nolberto Hanlon, MD  Relative Name and Phone Number:       Current Level of Care: Hospital Recommended Level of Care: [place] Prior Approval Number:    Date Approved/Denied:   PASRR Number: [id number]  Discharge Plan: SNF    Current Diagnoses: Patient Active Problem List   Diagnosis Date Noted   Anasarca 12/05/2021   Acute encephalopathy 12/04/2021   Pleural effusion on right 12/04/2021   Hematuria 12/04/2021   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[place])    Anemia of chronic disease    Thrombocytopenia ([place])    Sacral wound 01/13/2020   Pressure injury of skin 01/13/2020   Generalized weakness 01/13/2020   ESRD (end stage renal disease) ([place]) 04/25/2018     Orientation RESPIRATION BLADDER Height & Weight     Self, Time, Situation, Place  O2 (Nasal Cannula 2 L) Incontinent Weight: 212 lb 4.9 oz (96.3 kg) Height:  4\' 3"  (129.5 cm)  BEHAVIORAL SYMPTOMS/MOOD NEUROLOGICAL BOWEL NUTRITION STATUS   (None)  (None) Continent Diet (Soft)  AMBULATORY STATUS COMMUNICATION OF NEEDS Skin     Verbally Bruising, Other (Comment), PU Stage and Appropriate Care (Erythema/redness. MASD on buttocks: Foam.)   PU Stage 2 Dressing:  (Right and left buttocks: Foam.)                   Personal Care Assistance Level of Assistance              Functional Limitations Info  Sight, Hearing, Speech Sight Info: Adequate Hearing Info: Adequate Speech Info: Adequate    SPECIAL CARE FACTORS FREQUENCY                       Contractures Contractures Info: Not present    Additional Factors Info  Code Status, Allergies, Isolation Precautions Code Status Info: DNR Allergies Info: Simvastatin     Isolation Precautions Info: Contact: VRE, MRSA     Current Medications (12/07/2021):  This is the current hospital active medication list Current Facility-Administered Medications  Medication Dose Route Frequency Provider Last Rate Last Admin   acetaminophen (TYLENOL) tablet 650 mg  650 mg Oral Q6H PRN Jose Persia, MD   650 mg  at 12/06/21 2154   Or   acetaminophen (TYLENOL) suppository 650 mg  650 mg Rectal Q6H PRN Jose Persia, MD       atorvastatin (LIPITOR) tablet 40 mg  40 mg Oral QHS Jose Persia, MD   40 mg at 12/06/21 2155   Chlorhexidine Gluconate Cloth 2 % PADS 6 each  6 each Topical Q0600 Anthonette Legato, MD   6 each at 12/07/21 0440   clopidogrel (PLAVIX) tablet 75 mg  75 mg Oral Q breakfast Jose Persia, MD   75 mg at 12/07/21 2458   diclofenac Sodium (VOLTAREN) 1 % topical gel 2 g  2 g Topical Daily Jose Persia, MD   2 g at 09/98/33 8250   folic acid (FOLVITE) tablet 1 mg  1 mg Oral Daily Jose Persia, MD   1 mg at  12/07/21 0908   heparin injection 5,000 Units  5,000 Units Subcutaneous Q8H Jose Persia, MD   5,000 Units at 12/06/21 2157   metoprolol tartrate (LOPRESSOR) tablet 12.5 mg  12.5 mg Oral BID Nolberto Hanlon, MD   12.5 mg at 12/06/21 2155   midodrine (PROAMATINE) tablet 10 mg  10 mg Oral TID WC Nolberto Hanlon, MD       mupirocin ointment (BACTROBAN) 2 % 1 Application  1 Application Nasal BID Nolberto Hanlon, MD   1 Application at 53/97/67 0916   Oral care mouth rinse  15 mL Mouth Rinse PRN Nolberto Hanlon, MD       polyethylene glycol (MIRALAX / GLYCOLAX) packet 17 g  17 g Oral Daily PRN Jose Persia, MD   17 g at 12/06/21 2154   sertraline (ZOLOFT) tablet 50 mg  50 mg Oral Daily Jose Persia, MD   50 mg at 12/07/21 0906   sodium chloride flush (NS) 0.9 % injection 3 mL  3 mL Intravenous Q12H Jose Persia, MD   3 mL at 12/07/21 3419     Discharge Medications: Please see discharge summary for a list of discharge medications.  Relevant Imaging Results:  Relevant Lab Results:   Additional Information SS#: [id number]. HD TTS [facility] 11:40 am.  Candie Chroman, LCSW

## 2021-12-07 NOTE — Plan of Care (Signed)
  Problem: Coping: Goal: Level of anxiety will decrease Outcome: Not Progressing Note: Patient reports having hallucinations for 2 nights. Patient reports not being able to void for 2 months. Patient is experiencing hematuria. NP is aware.

## 2021-12-07 NOTE — TOC Transition Note (Signed)
Transition of Care [facility]) - CM/SW Discharge Note   Patient Details  Name: Antonio Phillips MRN: [id number] Date of Birth: 1945/11/22  Transition of Care [facility]) CM/SW Contact:  Candie Chroman, LCSW Phone Number: 12/07/2021, 12:05 PM   Clinical Narrative:   Patient has orders to discharge home today. RN will call report to [phone] (Room 221B). EMS transport has been arranged and he is 6th on the list. Tried calling friend April since she is listed as only emergency contact but patient said he hasn't seen her in a year. Patient reported no other family. No further concerns. CSW signing off.  Final next level of care: Skilled Nursing Facility Barriers to Discharge: No Barriers Identified   Patient Goals and CMS Choice   CMS Medicare.gov Compare Post Acute Care list provided to::  (N/A) Choice offered to / list presented to : NA  Discharge Placement   Existing PASRR number confirmed : 12/07/21          Patient [facility] bed at: [facility] Patient to be transferred to facility by: EMS Name of family member notified: Unable to leave voicemail for friend April Baldwin Patient and family notified of of transfer: 12/07/21  Discharge Plan and Services                                     Social Determinants of Health (SDOH) Interventions     Readmission Risk Interventions    09/10/2019    1:35 PM  Readmission Risk Prevention Plan  Transportation Screening Complete  PCP or Specialist Appt within 3-5 Days Patient refused  [place] or Home Care Consult Patient refused  Social Work Consult for [place] Planning/Counseling Complete  Palliative Care Screening Not Applicable  Medication Review Press photographer) Complete

## 2021-12-07 NOTE — Discharge Summary (Signed)
Antonio Phillips MGQ:[id number] DOB: May 25, 1945 DOA: 12/04/2021  PCP: Marsh Dolly, MD  Admit date: 12/04/2021 Discharge date: 12/07/2021  Admitted From: snf Disposition:  snf  Recommendations for Outpatient Follow-up:  Follow up with PCP in 1 week Please obtain BMP/CBC in one week Follow up with Urology in one week       Discharge Condition:Stable CODE STATUS:DNR  Diet recommendation: renal diet, low sodium   Brief/Interim Summary: PER HPI: Antonio Phillips is a 76 y.o. male with medical history significant of ESRD, chronic hypoxic respiratory failure on 2L [place], CAD s/p DES (09/2020), T2DM, permanent A. 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 History obtained from RN at [facility] and from patient. Patient mental status improved . He underwent dialysis . He also had thoracentesis by IR yesterday. yielding 700 mililiters of straw colored fluid. No immediate complications.   Diagnostic fluid was sent to the lab for further analysis. Patient will need to continue with his regular scheduled dialysis at his center. He remains stable and will discharge today. He did have hematuria, h/h remained stable. Will need to f/u with urology as outpatient for cystoscopy.   Acute encephalopathy Likely metabolic due to missing dialysis  Also on Lyrica which in setting of missed dialysis can contribute to encephalopathy  Lyrica and pain meds held initially.  Can resume Lyrica at the lower dose .  Discussed with patient about not missing his HD.    Hypotension Chronic On midodrine prior to HD. Will increase to 10mg  tid     Pleural effusion on right Anasarca Patient has a history of chronic hypoxic respiratory failure on 3 L nasal cannula daily.   CT abdomen/pelvis demonstrated large right pleural effusion. See full result. Had HD . S/p centesis by IR F/u with pcp for further management           Hematuria On  arrival to the ED, urinalysis was obtained via in and out; on catheter placement, gross hematuria drained into Foley. Had CT a/p and renal US , see results below. If able to tolerate can have abdominal MRI as outpatient, will defer to urology or pcp for further evaluation. Will f/u with urology for possible cystoscopy and w/u. H/h stable.       ESRD (end stage renal disease) [facility]) Nephrology was following Had HD x2. Will need to f/u with his outpatient HD schedule/spot.    Pressure injury of skin On arrival to the ED, patient was noted to have widespread stage I pressure ulcers of bilateral gluteal region.  No evidence of ulcerations      Atrial fibrillation, chronic (HCC) No evidence of RVR at this time.  Per [facility] records, patient is no longer on anticoagulation. Continue beta blockers with parameters.     Anemia of chronic disease H&H stable   Thrombocytopenia (HCC) Chronic thrombocytopenia previously evaluated by hematology in the past.   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) ([place])   Anasarca    Discharge Instructions  Discharge Instructions     Diet - low sodium heart healthy   Complete by: As directed    Discharge wound care:   Complete by: As directed    As above   Increase activity slowly   Complete by: As directed       Allergies as of 12/07/2021       Reactions   Simvastatin Other (See  Comments)        Medication List     STOP taking these medications    apixaban 5 MG Tabs tablet Commonly known as: ELIQUIS   dextromethorphan-guaiFENesin 30-600 MG 12hr tablet Commonly known as: MUCINEX DM   finasteride 5 MG tablet Commonly known as: PROSCAR   HYDROcodone-acetaminophen 5-325 MG tablet Commonly known as: NORCO/VICODIN   loperamide 2 MG tablet Commonly known as: IMODIUM A-D   loratadine 10 MG tablet Commonly known as: CLARITIN   oxyCODONE 5 MG immediate release tablet Commonly known as: Oxy IR/ROXICODONE   sodium hypochlorite 0.125 % Soln Commonly known as: DAKIN'S 1/4 STRENGTH   traZODone 50 MG tablet Commonly known as: DESYREL       TAKE these medications    acetaminophen 325 MG tablet Commonly known as: TYLENOL Take 2 tablets (650 mg total) by mouth every 6 (six) hours as needed for mild pain or moderate pain.   atorvastatin 40 MG tablet Commonly known as: LIPITOR Take 40 mg by mouth at bedtime.   calcitRIOL 0.25 MCG capsule Commonly known as: ROCALTROL Take 1 tablet by mouth daily.   calcium acetate 667 MG capsule Commonly known as: PHOSLO Take 1,334 mg by mouth 3 (three) times daily.   Carboxymethylcellulose Sod PF 0.5 % Soln Place 1 drop into both eyes 3 (three) times daily as needed (dry eyes).   Refresh Liquigel 1 % Gel Generic drug: Carboxymethylcellulose Sodium Place 1 drop into both eyes in the morning and at bedtime.   clopidogrel 75 MG tablet Commonly known as: PLAVIX Take 1 tablet (75 mg total) by mouth daily with breakfast.   cyanocobalamin 1000 MCG tablet Commonly known as: VITAMIN B12 Take 1,000 mcg by mouth daily.   diclofenac Sodium 1 % Gel Commonly known as: VOLTAREN Apply 2 g topically in the morning. (Apply to right stump)   folic acid 1 MG tablet Commonly known as: FOLVITE Take 1 mg by mouth daily.   lactulose (encephalopathy) 10 GM/15ML Soln Commonly known as: CHRONULAC Take 10 g by mouth 2 (two)  times daily as needed (mild constipation).   metoprolol tartrate 25 MG tablet Commonly known as: LOPRESSOR Take 0.5 tablets (12.5 mg total) by mouth 2 (two) times daily. Hold on dialysis days.  Other days hold if sbp <90 or HR <60 What changed: additional instructions   midodrine 10 MG tablet Commonly known as: PROAMATINE Take 1 tablet (10 mg total) by mouth 3 (three) times daily with meals. What changed:  how much to take when to take this additional instructions   mupirocin ointment 2 % Commonly known as: BACTROBAN Place 1 Application into the nose 2 (two) times daily.   pregabalin 100 MG capsule Commonly known as: LYRICA Take 1 capsule (100 mg total) by mouth 2 (two) times daily. What changed:  how much to take when to take this   rOPINIRole 1 MG tablet Commonly known as: REQUIP  Take 1 mg by mouth at bedtime.   sertraline 50 MG tablet Commonly known as: ZOLOFT Take 50 mg by mouth daily.   sevelamer carbonate 800 MG tablet Commonly known as: RENVELA Take 1,600 mg by mouth See admin instructions. Take 2 tablets (1600mg ) by mouth three times daily with meals on Monday, Wednesday, Friday and Sunday What changed: Another medication with the same name was removed. Continue taking this medication, and follow the directions you see here.               Discharge Care Instructions  (From admission, onward)           Start     Ordered   12/07/21 0000  Discharge wound care:       Comments: As above   12/07/21 1017            Follow-up Information     Stoioff, Ronda Fairly, MD Follow up in 1 week(s).   Specialty: Urology Why: hematuria Contact information: [place] [address] [phone]         Marsh Dolly, MD Follow up in 1 week(s).   Specialty: Internal Medicine Contact information: [place] [address] [phone]                Allergies  Allergen Reactions   Simvastatin Other  (See Comments)    Consultations: nephrology   Procedures/Studies: DG Chest Port 1 View  Result Date: 12/06/2021 CLINICAL DATA:  Status post right thoracentesis. EXAM: PORTABLE CHEST 1 VIEW COMPARISON:  December 04, 2021. FINDINGS: No pneumothorax status post right-sided thoracentesis. Right pleural effusion is smaller. IMPRESSION: No pneumothorax status post right-sided thoracentesis. Electronically Signed   By: Marijo Conception M.D.   On: 12/06/2021 15:45   US THORACENTESIS ASP PLEURAL SPACE W/IMG GUIDE  Result Date: 12/06/2021 INDICATION: Patient with history of end-stage renal disease, chronic hypoxic respiratory failure, AFib found to be hypotensive with pleural effusion. Request is for thoracentesis EXAM: ULTRASOUND GUIDED therapeutic and diagnostic right-sided THORACENTESIS MEDICATIONS: Lidocaine 1% 10 mL COMPLICATIONS: None immediate. PROCEDURE: An ultrasound guided thoracentesis was thoroughly discussed with the patient and questions answered. The benefits, risks, alternatives and complications were also discussed. The patient understands and wishes to proceed with the procedure. Written consent was obtained. Ultrasound was performed to localize and mark an adequate pocket of fluid in the right chest. The area was then prepped and draped in the normal sterile fashion. 1% Lidocaine was used for local anesthesia. Under ultrasound guidance a 6 Fr Safe-T-Centesis catheter was introduced. Thoracentesis was performed. The catheter was removed and a dressing applied. FINDINGS: A total of approximately 700 mL of straw-colored fluid was removed. Samples were sent to the laboratory as requested by the clinical team. Procedure terminated due to patient's inability to tolerate additional fluid removal IMPRESSION: Successful ultrasound guided therapeutic and diagnostic right-sided thoracentesis yielding 700 mL of pleural fluid. Read by: Rushie Nyhan, NP Electronically Signed   By: Albin Felling M.D.    On: 12/06/2021 15:44   US RENAL  Result Date: 12/04/2021 CLINICAL DATA:  Hematuria. EXAM: RENAL / URINARY TRACT ULTRASOUND COMPLETE COMPARISON:  Same day CT abdomen pelvis at 1439 hours FINDINGS: Right Kidney: Renal measurements: 9.8 x 5.7 x 5.1 cm = volume: 151 mL. Diffuse cortical thinning and increased echogenicity. No hydronephrosis visualized. A 2.6 cm cortical cyst is noted at the lower pole of the right kidney Left Kidney: Renal measurements: 11.4 x 6.4 x 6.6  cm = volume: 151 mL. Diffuse cortical thinning and increased echogenicity. No hydronephrosis visualized. A 3.5 cm cortical cyst is noted at the middle third of the left kidney. Bladder: Appears normal for degree of bladder distention. Other: Free fluid is noted in the right upper quadrant. Right pleural effusion is noted. Cholelithiasis. Gallbladder wall measures 0.4 cm in thickness. Multiple echogenic foci within the spleen correspond to vascular calcifications seen on same day CT abdomen pelvis. IMPRESSION: 1. Atrophic kidneys, consistent with chronic medical renal disease. 2. Single bilateral benign renal cortical cysts are noted. The other multiple bilateral exophytic renal lesions described on same day CT abdomen pelvis are not visualized as the exam is technically challenging secondary to patient body habitus and immobility. If further evaluation is clinically desired, abdominal MRI could be performed when the patient is clinically stable and able to follow directions and hold their breath (preferably as an outpatient). 3. Right upper quadrant ascites and right pleural effusion, corresponding to findings on same day CT abdomen pelvis. 4. Cholelithiasis. Gallbladder wall thickening is favored to represent sequela of fluid third-spacing as the gallbladder is minimally distended. Electronically Signed   By: Ileana Roup M.D.   On: 12/04/2021 21:02   CT ABDOMEN PELVIS WO CONTRAST  Result Date: 12/04/2021 CLINICAL DATA:  Lower abdominal,  buttock, and hip pain EXAM: CT ABDOMEN AND PELVIS WITHOUT CONTRAST TECHNIQUE: Multidetector CT imaging of the abdomen and pelvis was performed following the standard protocol without IV contrast. RADIATION DOSE REDUCTION: This exam was performed according to the departmental dose-optimization program which includes automated exposure control, adjustment of the mA and/or kV according to patient size and/or use of iterative reconstruction technique. COMPARISON:  CT chest abdomen pelvis, 02/20/2021 FINDINGS: Lower chest: Cardiomegaly. Three-vessel coronary artery calcifications. Aortic valve calcifications. Hepatobiliary: No solid liver abnormality is seen. Gallstones contracted in the gallbladder. No gallbladder wall thickening, or biliary dilatation. Pancreas: Unremarkable. No pancreatic ductal dilatation or surrounding inflammatory changes. Spleen: Normal in size without significant abnormality. Adrenals/Urinary Tract: Adrenal glands are unremarkable. Atrophic kidneys. No calculi or hydronephrosis. Multiple bilateral exophytic renal lesions of varying attenuation, several of intermediate soft tissue attenuation and not clearly simple cysts, including of the superior pole of the left kidney (series 2, image 40), the lateral midportion of the left kidney (series 2, image 46), in the inferior pole of the right kidney (series 2, image 58). These are generally unchanged compared to prior examination however incompletely characterized. Dense metallic streak artifact from bilateral hip total arthroplasty limits evaluation of the low pelvis. No obvious abnormality of the bladder. Stomach/Bowel: Stomach is within normal limits. Appendix appears normal. No evidence of bowel wall thickening, distention, or inflammatory changes. Descending and sigmoid diverticulosis. Vascular/Lymphatic: No significant vascular findings are present. No enlarged abdominal or pelvic lymph nodes. Reproductive: Dense metallic streak artifact from  bilateral hip total arthroplasty limits evaluation of the low pelvis. No obvious abnormality. Other: No abdominal wall hernia or abnormality. No ascites. Musculoskeletal: Status post bilateral hip total arthroplasty. Disc degenerative disease and bridging osteophytosis throughout the lower thoracic and lumbar spine, in keeping with DISH. IMPRESSION: 1. Moderate volume ascites. 2. Large right, small left pleural effusions.  Anasarca. 3. Descending and sigmoid diverticulosis without evidence of acute diverticulitis. 4. Cholelithiasis without evidence of acute cholecystitis. 5. Status post bilateral hip total arthroplasty without obvious acute abnormality to explain hip pain. 6. Cardiomegaly and coronary artery disease. 7. Multiple bilateral exophytic renal lesions of varying attenuation, several of intermediate soft tissue attenuation and not clearly  simple cysts. These are generally unchanged compared to prior examination and most likely hemorrhagic or proteinaceous cysts, however solid mass is not excluded. Initial renal ultrasound can be performed on a nonemergent, outpatient basis to ensure benign cystic character. Aortic Atherosclerosis (ICD10-I70.0). Electronically Signed   By: Delanna Ahmadi M.D.   On: 12/04/2021 15:11   CT HEAD WO CONTRAST (5MM)  Result Date: 12/04/2021 CLINICAL DATA:  Provided history: Mental status change, unknown cause. EXAM: CT HEAD WITHOUT CONTRAST TECHNIQUE: Contiguous axial images were obtained from the base of the skull through the vertex without intravenous contrast. RADIATION DOSE REDUCTION: This exam was performed according to the departmental dose-optimization program which includes automated exposure control, adjustment of the mA and/or kV according to patient size and/or use of iterative reconstruction technique. COMPARISON:  CT 02/20/2021. FINDINGS: Brain: Frontal lobe predominant cerebral atrophy. Mild patchy and ill-defined hypoattenuation within the cerebral white matter,  nonspecific but compatible with chronic small vessel ischemic disease. Mineralization within the bilateral basal ganglia. There is no acute intracranial hemorrhage. No demarcated cortical infarct. No extra-axial fluid collection. No evidence of an intracranial mass. No midline shift. Vascular: No hyperdense vessel.  Atherosclerotic calcifications. Skull: No fracture or aggressive osseous lesion. Sinuses/Orbits: Mild mucosal thickening within the bilateral frontal and ethmoid sinuses. Fluid level, and background mucosal thickening, within the right sphenoid sinus. Trace mucosal thickening within the right maxillary sinus at the imaged levels. Other: Bilateral mastoid effusions. IMPRESSION: No evidence of acute intracranial abnormality. Mild chronic small vessel ischemic changes within the cerebral white matter. Frontal lobe predominant cerebral atrophy. Paranasal sinus disease at the imaged levels, as described. Bilateral mastoid effusions. Electronically Signed   By: Kellie Simmering D.O.   On: 12/04/2021 11:32   DG Chest Portable 1 View  Result Date: 12/04/2021 CLINICAL DATA:  Altered mental status, weakness EXAM: PORTABLE CHEST 1 VIEW COMPARISON:  Previous studies including the examination of 06/05/2021 FINDINGS: Transverse diameter of heart is increased. There is interval appearance of moderate right pleural effusion. Possibility of underlying infiltrates is not excluded. Central pulmonary vessels are prominent which may be due to poor inspiration. Left lateral CP angle is clear. There is no pneumothorax. IMPRESSION: Cardiomegaly. Moderate right pleural effusion. Possibility of underlying atelectasis/pneumonia is not excluded. Electronically Signed   By: Elmer Picker M.D.   On: 12/04/2021 11:17      Subjective: No dizziness. No cp  Discharge Exam: Vitals:   12/07/21 0402 12/07/21 0840  BP: 103/64 (!) 87/53  Pulse: 68 84  Resp: 19 16  Temp: 97.8 F (36.6 C) 97.9 F (36.6 C)  SpO2: 100% 96%    Vitals:   12/06/21 2033 12/06/21 2344 12/07/21 0402 12/07/21 0840  BP: 92/63 (!) 90/53 103/64 (!) 87/53  Pulse: 68 74 68 84  Resp: 16 18 19 16   Temp: (!) 97.4 F (36.3 C) 97.9 F (36.6 C) 97.8 F (36.6 C) 97.9 F (36.6 C)  TempSrc: Oral Oral Oral   SpO2: 99% 100% 100% 96%  Weight:      Height:        General: Pt is alert, awake, not in acute distress Cardiovascular: RRR, S1/S2 +, no rubs, no gallops Respiratory: decrease bs, no wheezing Abdominal: Soft, NT, ND, bowel sounds + Extremities: +edema    The results of significant diagnostics from this hospitalization (including imaging, microbiology, ancillary and laboratory) are listed below for reference.     Microbiology: Recent Results (from the past 240 hour(s))  Culture, blood (Routine x 2)  Status: None (Preliminary result)   Collection Time: 12/04/21 10:54 AM   Specimen: Right Antecubital; Blood  Result Value Ref Range Status   Specimen Description RIGHT ANTECUBITAL  Final   Special Requests   Final    BOTTLES DRAWN AEROBIC AND ANAEROBIC Blood Culture adequate volume   Culture   Final    NO GROWTH 3 DAYS Performed at [facility], [address], [place], [place] [address]    Report Status PENDING  Incomplete  SARS Coronavirus 2 by RT PCR (hospital order, performed in [facility] hospital lab) *cepheid single result test* Anterior Nasal Swab     Status: None   Collection Time: 12/04/21 11:44 AM   Specimen: Anterior Nasal Swab  Result Value Ref Range Status   SARS Coronavirus 2 by RT PCR NEGATIVE NEGATIVE Final    Comment: (NOTE) SARS-CoV-2 target nucleic acids are NOT DETECTED.  The SARS-CoV-2 RNA is generally detectable in upper and lower respiratory specimens during the acute phase of infection. The lowest concentration of SARS-CoV-2 viral copies this assay can detect is 250 copies / mL. A negative result does not preclude SARS-CoV-2 infection and should not be used as the sole basis for  treatment or other patient management decisions.  A negative result may occur with improper specimen collection / handling, submission of specimen other than nasopharyngeal swab, presence of viral mutation(s) within the areas targeted by this assay, and inadequate number of viral copies (<250 copies / mL). A negative result must be combined with clinical observations, patient history, and epidemiological information.  Fact Sheet for Patients:   https://www.patel.info/  Fact Sheet for Healthcare Providers: https://hall.com/  This test is not yet approved or  cleared by the [place] FDA and has been authorized for detection and/or diagnosis of SARS-CoV-2 by FDA under an Emergency Use Authorization (EUA).  This EUA will remain in effect (meaning this test can be used) for the duration of the COVID-19 declaration under Section 564(b)(1) of the Act, 21 U.S.C. section 360bbb-3(b)(1), unless the authorization is terminated or revoked sooner.  Performed at [facility], [address], [place], [place] [address]   Urine Culture     Status: Abnormal (Preliminary result)   Collection Time: 12/04/21  1:16 PM   Specimen: In/Out Cath Urine  Result Value Ref Range Status   Specimen Description   Final    IN/OUT CATH URINE Performed at [facility], [address], [place], [place] [address]    Special Requests   Final    NONE Performed at [facility], [address] [place], [place] [address]    Culture (A)  Final    10,000 COLONIES/mL PROTEUS MIRABILIS SUSCEPTIBILITIES TO FOLLOW Performed at [facility], [place] [address] [place], [place] [address]    Report Status PENDING  Incomplete  Culture, blood (Routine X 2) w Reflex to ID Panel     Status: None (Preliminary result)   Collection Time: 12/05/21  9:19 AM   Specimen: BLOOD  Result Value Ref Range Status   Specimen Description BLOOD RIGHT  ANTECUBITAL  Final   Special Requests   Final    BOTTLES DRAWN AEROBIC AND ANAEROBIC Blood Culture adequate volume   Culture   Final    NO GROWTH 2 DAYS Performed at [facility], [address] [place], [place] [address]    Report Status PENDING  Incomplete  MRSA Next Gen by PCR, Nasal     Status: Abnormal   Collection  Time: 12/05/21  7:58 PM   Specimen: Nasal Mucosa; Nasal Swab  Result Value Ref Range Status   MRSA by PCR Next Gen DETECTED (A) NOT DETECTED Final    Comment: RESULT CALLED TO, READ BACK BY AND VERIFIED WITH: MARCEL TURNER 12/05/21 1020 KLW (NOTE) The GeneXpert MRSA Assay (FDA approved for NASAL specimens only), is one component of a comprehensive MRSA colonization surveillance program. It is not intended to diagnose MRSA infection nor to guide or monitor treatment for MRSA infections. Test performance is not FDA approved in patients less than 18 years old. Performed at [facility], [address] [place], [place] [address]   Body fluid culture w Gram Stain     Status: None (Preliminary result)   Collection Time: 12/06/21  3:30 PM   Specimen: PATH Cytology Pleural fluid  Result Value Ref Range Status   Specimen Description   Final    PLEURAL Performed at [facility], [address], [place], [place] [address]    Special Requests   Final    NONE Performed at [facility], [place], [place] [address]    Gram Stain NO WBC SEEN NO ORGANISMS SEEN   Final   Culture   Final    NO GROWTH < 12 HOURS Performed at [facility], [place] [address], [place], [place] [address]    Report Status PENDING  Incomplete     Labs: BNP (last 3 results) Recent Labs    02/20/21 2123 06/05/21 0125 12/04/21 1054  BNP >4,500.0* 2,807.3* 0,100.7*   Basic Metabolic Panel: Recent Labs  Lab 12/04/21 1054 12/05/21 0919  NA 141 141  K 3.9 4.2  CL 114* 102  CO2 17* 26  GLUCOSE 71 96  BUN 39* 44*  CREATININE  4.19* 5.21*  CALCIUM 7.1* 9.3  PHOS  --  4.4   Liver Function Tests: Recent Labs  Lab 12/04/21 1054 12/05/21 0919  AST 16 16  ALT 6 9  ALKPHOS 56 75  BILITOT 1.6* 1.6*  PROT 4.5* 6.2*  ALBUMIN 2.5* 3.4*   No results for input(s): "LIPASE", "AMYLASE" in the last 168 hours. No results for input(s): "AMMONIA" in the last 168 hours. CBC: Recent Labs  Lab 12/04/21 1054 12/05/21 0919  WBC 8.3 7.1  NEUTROABS 6.1 5.1  HGB 11.8* 11.2*  HCT 41.3 37.6*  MCV 109.0* 105.0*  PLT 85* 70*   Cardiac Enzymes: No results for input(s): "CKTOTAL", "CKMB", "CKMBINDEX", "TROPONINI" in the last 168 hours. BNP: Invalid input(s): "POCBNP" CBG: No results for input(s): "GLUCAP" in the last 168 hours. D-Dimer No results for input(s): "DDIMER" in the last 72 hours. Hgb A1c No results for input(s): "HGBA1C" in the last 72 hours. Lipid Profile No results for input(s): "CHOL", "HDL", "LDLCALC", "TRIG", "CHOLHDL", "LDLDIRECT" in the last 72 hours. Thyroid function studies No results for input(s): "TSH", "T4TOTAL", "T3FREE", "THYROIDAB" in the last 72 hours.  Invalid input(s): "FREET3" Anemia work up No results for input(s): "VITAMINB12", "FOLATE", "FERRITIN", "TIBC", "IRON", "RETICCTPCT" in the last 72 hours. Urinalysis    Component Value Date/Time   COLORURINE RED (A) 12/04/2021 1316   APPEARANCEUR TURBID (A) 12/04/2021 1316   APPEARANCEUR Hazy 11/14/2012 2044   LABSPEC 1.012 12/04/2021 1316   LABSPEC 1.016 11/14/2012 2044   PHURINE  12/04/2021 1316    TEST NOT REPORTED DUE TO COLOR INTERFERENCE OF URINE PIGMENT   GLUCOSEU (A) 12/04/2021 1316    TEST NOT REPORTED DUE TO COLOR INTERFERENCE OF URINE  PIGMENT   GLUCOSEU 150 mg/dL 11/14/2012 2044   HGBUR (A) 12/04/2021 1316    TEST NOT REPORTED DUE TO COLOR INTERFERENCE OF URINE PIGMENT   BILIRUBINUR (A) 12/04/2021 1316    TEST NOT REPORTED DUE TO COLOR INTERFERENCE OF URINE PIGMENT   BILIRUBINUR Negative 11/14/2012 2044   KETONESUR (A)  12/04/2021 1316    TEST NOT REPORTED DUE TO COLOR INTERFERENCE OF URINE PIGMENT   PROTEINUR (A) 12/04/2021 1316    TEST NOT REPORTED DUE TO COLOR INTERFERENCE OF URINE PIGMENT   NITRITE (A) 12/04/2021 1316    TEST NOT REPORTED DUE TO COLOR INTERFERENCE OF URINE PIGMENT   LEUKOCYTESUR (A) 12/04/2021 1316    TEST NOT REPORTED DUE TO COLOR INTERFERENCE OF URINE PIGMENT   LEUKOCYTESUR Trace 11/14/2012 2044   Sepsis Labs Recent Labs  Lab 12/04/21 1054 12/05/21 0919  WBC 8.3 7.1   Microbiology Recent Results (from the past 240 hour(s))  Culture, blood (Routine x 2)     Status: None (Preliminary result)   Collection Time: 12/04/21 10:54 AM   Specimen: Right Antecubital; Blood  Result Value Ref Range Status   Specimen Description RIGHT ANTECUBITAL  Final   Special Requests   Final    BOTTLES DRAWN AEROBIC AND ANAEROBIC Blood Culture adequate volume   Culture   Final    NO GROWTH 3 DAYS Performed at [facility], [address], [place], [place] [address]    Report Status PENDING  Incomplete  SARS Coronavirus 2 by RT PCR (hospital order, performed in [place] hospital lab) *cepheid single result test* Anterior Nasal Swab     Status: None   Collection Time: 12/04/21 11:44 AM   Specimen: Anterior Nasal Swab  Result Value Ref Range Status   SARS Coronavirus 2 by RT PCR NEGATIVE NEGATIVE Final    Comment: (NOTE) SARS-CoV-2 target nucleic acids are NOT DETECTED.  The SARS-CoV-2 RNA is generally detectable in upper and lower respiratory specimens during the acute phase of infection. The lowest concentration of SARS-CoV-2 viral copies this assay can detect is 250 copies / mL. A negative result does not preclude SARS-CoV-2 infection and should not be used as the sole basis for treatment or other patient management decisions.  A negative result may occur with improper specimen collection / handling, submission of specimen other than nasopharyngeal swab, presence of viral  mutation(s) within the areas targeted by this assay, and inadequate number of viral copies (<250 copies / mL). A negative result must be combined with clinical observations, patient history, and epidemiological information.  Fact Sheet for Patients:   https://www.patel.info/  Fact Sheet for Healthcare Providers: https://hall.com/  This test is not yet approved or  cleared by the [place] FDA and has been authorized for detection and/or diagnosis of SARS-CoV-2 by FDA under an Emergency Use Authorization (EUA).  This EUA will remain in effect (meaning this test can be used) for the duration of the COVID-19 declaration under Section 564(b)(1) of the Act, 21 U.S.C. section 360bbb-3(b)(1), unless the authorization is terminated or revoked sooner.  Performed at [facility], [address] [place], [place] [address]   Urine Culture     Status: Abnormal (Preliminary result)   Collection Time: 12/04/21  1:16 PM   Specimen: In/Out Cath Urine  Result Value Ref Range Status   Specimen Description   Final    IN/OUT CATH URINE Performed at [facility], [address], [place], [place] [address]    Special Requests  Final    NONE Performed at [facility], [address], [place], [place] [address]    Culture (A)  Final    10,000 COLONIES/mL PROTEUS MIRABILIS SUSCEPTIBILITIES TO FOLLOW Performed at [facility], [place] [address], [place], [place] [address]    Report Status PENDING  Incomplete  Culture, blood (Routine X 2) w Reflex to ID Panel     Status: None (Preliminary result)   Collection Time: 12/05/21  9:19 AM   Specimen: BLOOD  Result Value Ref Range Status   Specimen Description BLOOD RIGHT ANTECUBITAL  Final   Special Requests   Final    BOTTLES DRAWN AEROBIC AND ANAEROBIC Blood Culture adequate volume   Culture   Final    NO GROWTH 2 DAYS Performed at [facility], [address] [place], [place] [address]    Report Status PENDING  Incomplete  MRSA Next Gen by PCR, Nasal     Status: Abnormal   Collection Time: 12/05/21  7:58 PM   Specimen: Nasal Mucosa; Nasal Swab  Result Value Ref Range Status   MRSA by PCR Next Gen DETECTED (A) NOT DETECTED Final    Comment: RESULT CALLED TO, READ BACK BY AND VERIFIED WITH: MARCEL TURNER 12/05/21 1020 KLW (NOTE) The GeneXpert MRSA Assay (FDA approved for NASAL specimens only), is one component of a comprehensive MRSA colonization surveillance program. It is not intended to diagnose MRSA infection nor to guide or monitor treatment for MRSA infections. Test performance is not FDA approved in patients less than 20 years old. Performed at [facility], [address] [place], [place] [address]   Body fluid culture w Gram Stain     Status: None (Preliminary result)   Collection Time: 12/06/21  3:30 PM   Specimen: PATH Cytology Pleural fluid  Result Value Ref Range Status   Specimen Description   Final    PLEURAL Performed at [facility], [address], [place], [place] [address]    Special Requests   Final    NONE Performed at [facility], [place], [place] [address]    Gram Stain NO WBC SEEN NO ORGANISMS SEEN   Final   Culture   Final    NO GROWTH < 12 HOURS Performed at [facility], [place] [address], [place], [place] [address]    Report Status PENDING  Incomplete     Time coordinating discharge: Over 30 minutes  SIGNED:   Nolberto Hanlon, MD  [facility] 12/07/2021, 11:38 AM Pager   If 7PM-7AM, please contact night-coverage [email or web address] Password [email or web address]

## 2021-12-07 NOTE — Progress Notes (Signed)
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  Patient was recently admitted to [facility] and discharged to SNF.  It seems like patient refused most recent dialysis on 12/02/2021.  Upon admission he has complained of gluteal pain as well as some shortness of breath.   Patient is known to our practice and was receiving outpatient dialysis at [facility] with TTS schedule.    Patient seen earlier today.  Sleepy but able to follow simple commands.  Continues to have large amount of dependent edema.   Objective:  Vital signs in last 24 hours:  Temp:  [97.4 F (36.3 C)-98.4 F (36.9 C)] 98.4 F (36.9 C) (10/03 1149) Pulse Rate:  [59-84] 77 (10/03 1149) Resp:  [12-19] 16 (10/03 1149) BP: (87-103)/(53-64) 97/62 (10/03 1149) SpO2:  [92 %-100 %] 100 % (10/03 1149)  Weight change: -2.2 kg Filed Weights   12/05/21 2023 12/06/21 0441 12/06/21 0853  Weight: 98.5 kg 97.9 kg 96.3 kg    Intake/Output: I/O last 3 completed shifts: In: 120 [P.O.:120] Out: 1500 [Other:1500]   Intake/Output this shift:  No intake/output data recorded.  Physical Exam: General: Chronically ill-appearing  Head: Normocephalic, atraumatic. Moist oral mucosal membranes  Neck: Supple  Lungs:  Clear to auscultation, normal effort  Heart: S1S2 no rubs, irregular A Fib  Abdomen:  Soft, nontender, bowel sounds present  Extremities: Edema 2+, right BKA , left AKA  Neurologic: Resting quietly  Skin: No acute rash  Access: Lt AVF    Basic Metabolic Panel: Recent Labs  Lab 12/04/21 1054 12/05/21 0919  NA 141 141  K 3.9 4.2  CL 114* 102  CO2 17* 26  GLUCOSE  71 96  BUN 39* 44*  CREATININE 4.19* 5.21*  CALCIUM 7.1* 9.3  PHOS  --  4.4     Liver Function Tests: Recent Labs  Lab 12/04/21 1054 12/05/21 0919  AST 16 16  ALT 6 9  ALKPHOS 56 75  BILITOT 1.6* 1.6*  PROT 4.5* 6.2*  ALBUMIN 2.5* 3.4*    No results for input(s): "LIPASE", "AMYLASE" in the last 168 hours. No results for input(s): "AMMONIA" in the last 168 hours.  CBC: Recent Labs  Lab 12/04/21 1054 12/05/21 0919  WBC 8.3 7.1  NEUTROABS 6.1 5.1  HGB 11.8* 11.2*  HCT 41.3 37.6*  MCV 109.0* 105.0*  PLT 85* 70*     Cardiac Enzymes: No results for input(s): "CKTOTAL", "CKMB", "CKMBINDEX", "TROPONINI" in the last 168 hours.  BNP: Invalid input(s): "POCBNP"  CBG: No results for input(s): "GLUCAP" in the last 168 hours.  Microbiology: Results for orders placed or performed during the hospital encounter of 12/04/21  Culture, blood (Routine x 2)     Status: None (Preliminary result)   Collection Time: 12/04/21 10:54 AM   Specimen: Right Antecubital; Blood  Result Value Ref Range Status   Specimen Description RIGHT ANTECUBITAL  Final   Special Requests   Final    BOTTLES DRAWN AEROBIC AND ANAEROBIC Blood Culture adequate volume   Culture   Final    NO GROWTH 3 DAYS Performed at [facility], [address], [place], [place] [address]    Report Status  PENDING  Incomplete  SARS Coronavirus 2 by RT PCR (hospital order, performed in [facility] hospital lab) *cepheid single result test* Anterior Nasal Swab     Status: None   Collection Time: 12/04/21 11:44 AM   Specimen: Anterior Nasal Swab  Result Value Ref Range Status   SARS Coronavirus 2 by RT PCR NEGATIVE NEGATIVE Final    Comment: (NOTE) SARS-CoV-2 target nucleic acids are NOT DETECTED.  The SARS-CoV-2 RNA is generally detectable in upper and lower respiratory specimens during the acute phase of infection. The lowest concentration of SARS-CoV-2 viral copies this assay can detect is 250 copies /  mL. A negative result does not preclude SARS-CoV-2 infection and should not be used as the sole basis for treatment or other patient management decisions.  A negative result may occur with improper specimen collection / handling, submission of specimen other than nasopharyngeal swab, presence of viral mutation(s) within the areas targeted by this assay, and inadequate number of viral copies (<250 copies / mL). A negative result must be combined with clinical observations, patient history, and epidemiological information.  Fact Sheet for Patients:   https://www.patel.info/  Fact Sheet for Healthcare Providers: https://hall.com/  This test is not yet approved or  cleared by the [place] FDA and has been authorized for detection and/or diagnosis of SARS-CoV-2 by FDA under an Emergency Use Authorization (EUA).  This EUA will remain in effect (meaning this test can be used) for the duration of the COVID-19 declaration under Section 564(b)(1) of the Act, 21 U.S.C. section 360bbb-3(b)(1), unless the authorization is terminated or revoked sooner.  Performed at [facility], [address], [place], [place] [address]   Urine Culture     Status: Abnormal (Preliminary result)   Collection Time: 12/04/21  1:16 PM   Specimen: In/Out Cath Urine  Result Value Ref Range Status   Specimen Description   Final    IN/OUT CATH URINE Performed at [facility], [address] [place], [place] [address]    Special Requests   Final    NONE Performed at [facility], [address], [place], [place] [address]    Culture (A)  Final    10,000 COLONIES/mL PROTEUS MIRABILIS SUSCEPTIBILITIES TO FOLLOW Performed at [facility], [place] [address], [place], [place] [address]    Report Status PENDING  Incomplete  Culture, blood (Routine X 2) w Reflex to ID Panel     Status: None (Preliminary result)   Collection Time:  12/05/21  9:19 AM   Specimen: BLOOD  Result Value Ref Range Status   Specimen Description BLOOD RIGHT ANTECUBITAL  Final   Special Requests   Final    BOTTLES DRAWN AEROBIC AND ANAEROBIC Blood Culture adequate volume   Culture   Final    NO GROWTH 2 DAYS Performed at [facility], [address] [place], [place] [address]    Report Status PENDING  Incomplete  MRSA Next Gen by PCR, Nasal     Status: Abnormal   Collection Time: 12/05/21  7:58 PM   Specimen: Nasal Mucosa; Nasal Swab  Result Value Ref Range Status   MRSA by PCR Next Gen DETECTED (A) NOT DETECTED Final    Comment: RESULT CALLED TO, READ BACK BY AND VERIFIED WITH: MARCEL TURNER 12/05/21 1020 KLW (NOTE) The GeneXpert MRSA Assay (FDA approved for NASAL specimens only), is one component of a comprehensive MRSA colonization surveillance program. It is not intended to diagnose MRSA infection nor to guide  or monitor treatment for MRSA infections. Test performance is not FDA approved in patients less than 37 years old. Performed at [facility], [address] [place], [place] [address]   Body fluid culture w Gram Stain     Status: None (Preliminary result)   Collection Time: 12/06/21  3:30 PM   Specimen: PATH Cytology Pleural fluid  Result Value Ref Range Status   Specimen Description   Final    PLEURAL Performed at [facility], [address] [place], [place] [address]    Special Requests   Final    NONE Performed at [facility], [place], [place] [address]    Gram Stain NO WBC SEEN NO ORGANISMS SEEN   Final   Culture   Final    NO GROWTH < 12 HOURS Performed at [facility], [place] [address], [place], [place] [address]    Report Status PENDING  Incomplete    Coagulation Studies: Recent Labs    12/05/21 0919  LABPROT 17.0*  INR 1.4*     Urinalysis: Recent Labs    12/04/21 1316  COLORURINE RED*  LABSPEC 1.012  PHURINE TEST NOT REPORTED  DUE TO COLOR INTERFERENCE OF URINE PIGMENT  GLUCOSEU TEST NOT REPORTED DUE TO COLOR INTERFERENCE OF URINE PIGMENT*  HGBUR TEST NOT REPORTED DUE TO COLOR INTERFERENCE OF URINE PIGMENT*  BILIRUBINUR TEST NOT REPORTED DUE TO COLOR INTERFERENCE OF URINE PIGMENT*  KETONESUR TEST NOT REPORTED DUE TO COLOR INTERFERENCE OF URINE PIGMENT*  PROTEINUR TEST NOT REPORTED DUE TO COLOR INTERFERENCE OF URINE PIGMENT*  NITRITE TEST NOT REPORTED DUE TO COLOR INTERFERENCE OF URINE PIGMENT*  LEUKOCYTESUR TEST NOT REPORTED DUE TO COLOR INTERFERENCE OF URINE PIGMENT*       Imaging: DG Chest Port 1 View  Result Date: 12/06/2021 CLINICAL DATA:  Status post right thoracentesis. EXAM: PORTABLE CHEST 1 VIEW COMPARISON:  December 04, 2021. FINDINGS: No pneumothorax status post right-sided thoracentesis. Right pleural effusion is smaller. IMPRESSION: No pneumothorax status post right-sided thoracentesis. Electronically Signed   By: Marijo Conception M.D.   On: 12/06/2021 15:45   US THORACENTESIS ASP PLEURAL SPACE W/IMG GUIDE  Result Date: 12/06/2021 INDICATION: Patient with history of end-stage renal disease, chronic hypoxic respiratory failure, AFib found to be hypotensive with pleural effusion. Request is for thoracentesis EXAM: ULTRASOUND GUIDED therapeutic and diagnostic right-sided THORACENTESIS MEDICATIONS: Lidocaine 1% 10 mL COMPLICATIONS: None immediate. PROCEDURE: An ultrasound guided thoracentesis was thoroughly discussed with the patient and questions answered. The benefits, risks, alternatives and complications were also discussed. The patient understands and wishes to proceed with the procedure. Written consent was obtained. Ultrasound was performed to localize and mark an adequate pocket of fluid in the right chest. The area was then prepped and draped in the normal sterile fashion. 1% Lidocaine was used for local anesthesia. Under ultrasound guidance a 6 Fr Safe-T-Centesis catheter was introduced. Thoracentesis  was performed. The catheter was removed and a dressing applied. FINDINGS: A total of approximately 700 mL of straw-colored fluid was removed. Samples were sent to the laboratory as requested by the clinical team. Procedure terminated due to patient's inability to tolerate additional fluid removal IMPRESSION: Successful ultrasound guided therapeutic and diagnostic right-sided thoracentesis yielding 700 mL of pleural fluid. Read by: Rushie Nyhan, NP Electronically Signed   By: Albin Felling M.D.   On: 12/06/2021 15:44     Medications:     atorvastatin  40 mg Oral QHS   Chlorhexidine Gluconate Cloth  6 each Topical Q0600   clopidogrel  75 mg Oral Q breakfast   diclofenac Sodium  2 g Topical Daily   folic acid  1 mg Oral Daily   heparin  5,000 Units Subcutaneous Q8H   metoprolol tartrate  12.5 mg Oral BID   midodrine  10 mg Oral TID WC   mupirocin ointment  1 Application Nasal BID   sertraline  50 mg Oral Daily   sodium chloride flush  3 mL Intravenous Q12H   acetaminophen **OR** acetaminophen, mouth rinse, polyethylene glycol  Assessment/ Plan:  76 y.o. 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  Patient also noted to be lethargic.  He also missed recent dialysis treatment prior to the admission.  We were consulted to manage the dialysis needs during this admission.  End-stage renal disease on hemodialysis TTS schedule: Patient refused to complete hemodialysis treatment on Saturday.  Completed HD treatment on Monday.  He can resume regular dialysis as outpatient starting Thursday.  Anemia of chronic disease: Patient's latest hemoglobin is 11.2.  We will follow-up H&H.   Acute encephalopathy likely secondary to missed  dialysis/medications: Will continue with dialysis treatment as mentioned above. Holding medications that could cause encephalopathy.  Continue to monitor mentation. Was calm during HD today.  Encouraged patient not to miss any dialysis treatment.  He nodded indicating he understood.   Secondary hyperparathyroidism: Calcium and phosphorus at target goal.  Follow-up bone minerals  Chronic Hypotension: Continue midodrine 3 times daily.  Blood pressure closely Generalized edema-removal is limited by low blood pressure.   LOS: 1 Elizabeth Haff 10/3/202312:49 PM

## 2021-12-08 ENCOUNTER — Telehealth: Payer: Self-pay | Admitting: *Deleted

## 2021-12-08 DIAGNOSIS — N2581 Secondary hyperparathyroidism of renal origin: Secondary | ICD-10-CM | POA: Diagnosis not present

## 2021-12-08 DIAGNOSIS — D631 Anemia in chronic kidney disease: Secondary | ICD-10-CM | POA: Diagnosis not present

## 2021-12-08 DIAGNOSIS — G9340 Encephalopathy, unspecified: Secondary | ICD-10-CM | POA: Diagnosis not present

## 2021-12-08 DIAGNOSIS — I9589 Other hypotension: Secondary | ICD-10-CM | POA: Diagnosis not present

## 2021-12-08 DIAGNOSIS — Z992 Dependence on renal dialysis: Secondary | ICD-10-CM | POA: Diagnosis not present

## 2021-12-08 DIAGNOSIS — L89159 Pressure ulcer of sacral region, unspecified stage: Secondary | ICD-10-CM | POA: Diagnosis not present

## 2021-12-08 DIAGNOSIS — N186 End stage renal disease: Secondary | ICD-10-CM | POA: Diagnosis not present

## 2021-12-08 DIAGNOSIS — R319 Hematuria, unspecified: Secondary | ICD-10-CM | POA: Diagnosis not present

## 2021-12-08 DIAGNOSIS — I959 Hypotension, unspecified: Secondary | ICD-10-CM | POA: Diagnosis not present

## 2021-12-08 DIAGNOSIS — I4891 Unspecified atrial fibrillation: Secondary | ICD-10-CM | POA: Diagnosis not present

## 2021-12-08 DIAGNOSIS — J90 Pleural effusion, not elsewhere classified: Secondary | ICD-10-CM | POA: Diagnosis not present

## 2021-12-08 LAB — URINE CULTURE: Culture: 10000 — AB

## 2021-12-08 LAB — CYTOLOGY - NON PAP

## 2021-12-08 NOTE — Telephone Encounter (Signed)
The home that he is at will call us back to schedule appt.

## 2021-12-08 NOTE — Telephone Encounter (Signed)
-----   Message from Abbie Sons, MD sent at 12/07/2021  4:45 PM EDT ----- Regarding: Hospital follow-up Please schedule hospital follow-up appointment 2-3 weeks-gross hematuria

## 2021-12-09 LAB — CULTURE, BLOOD (ROUTINE X 2)
Culture: NO GROWTH
Special Requests: ADEQUATE

## 2021-12-10 LAB — CULTURE, BLOOD (ROUTINE X 2)
Culture: NO GROWTH
Special Requests: ADEQUATE

## 2021-12-10 LAB — BODY FLUID CULTURE W GRAM STAIN
Culture: NO GROWTH
Gram Stain: NONE SEEN

## 2021-12-13 LAB — MISC LABCORP TEST (SEND OUT): Labcorp test code: 9985

## 2021-12-16 ENCOUNTER — Emergency Department: Payer: No Typology Code available for payment source

## 2021-12-16 ENCOUNTER — Inpatient Hospital Stay
Admission: EM | Admit: 2021-12-16 | Discharge: 2021-12-22 | DRG: 291 | Disposition: A | Discharge status: Skilled Nursing Facility | Payer: No Typology Code available for payment source | Source: Skilled Nursing Facility | Attending: Osteopathic Medicine | Admitting: Osteopathic Medicine

## 2021-12-16 ENCOUNTER — Other Ambulatory Visit: Payer: Self-pay

## 2021-12-16 DIAGNOSIS — J9622 Acute and chronic respiratory failure with hypercapnia: Secondary | ICD-10-CM | POA: Diagnosis present

## 2021-12-16 DIAGNOSIS — Z992 Dependence on renal dialysis: Secondary | ICD-10-CM

## 2021-12-16 DIAGNOSIS — G9340 Encephalopathy, unspecified: Secondary | ICD-10-CM

## 2021-12-16 DIAGNOSIS — Z89511 Acquired absence of right leg below knee: Secondary | ICD-10-CM | POA: Diagnosis not present

## 2021-12-16 DIAGNOSIS — N186 End stage renal disease: Secondary | ICD-10-CM | POA: Diagnosis not present

## 2021-12-16 DIAGNOSIS — Z7401 Bed confinement status: Secondary | ICD-10-CM | POA: Diagnosis not present

## 2021-12-16 DIAGNOSIS — Z6841 Body Mass Index (BMI) 40.0 and over, adult: Secondary | ICD-10-CM

## 2021-12-16 DIAGNOSIS — H9190 Unspecified hearing loss, unspecified ear: Secondary | ICD-10-CM | POA: Diagnosis present

## 2021-12-16 DIAGNOSIS — D539 Nutritional anemia, unspecified: Secondary | ICD-10-CM | POA: Diagnosis present

## 2021-12-16 DIAGNOSIS — B957 Other staphylococcus as the cause of diseases classified elsewhere: Secondary | ICD-10-CM | POA: Diagnosis not present

## 2021-12-16 DIAGNOSIS — J9602 Acute respiratory failure with hypercapnia: Principal | ICD-10-CM

## 2021-12-16 DIAGNOSIS — R1111 Vomiting without nausea: Secondary | ICD-10-CM | POA: Diagnosis not present

## 2021-12-16 DIAGNOSIS — N2581 Secondary hyperparathyroidism of renal origin: Secondary | ICD-10-CM | POA: Diagnosis not present

## 2021-12-16 DIAGNOSIS — I2489 Other forms of acute ischemic heart disease: Secondary | ICD-10-CM | POA: Diagnosis present

## 2021-12-16 DIAGNOSIS — Z7902 Long term (current) use of antithrombotics/antiplatelets: Secondary | ICD-10-CM

## 2021-12-16 DIAGNOSIS — I25118 Atherosclerotic heart disease of native coronary artery with other forms of angina pectoris: Secondary | ICD-10-CM | POA: Diagnosis present

## 2021-12-16 DIAGNOSIS — I5023 Acute on chronic systolic (congestive) heart failure: Secondary | ICD-10-CM | POA: Diagnosis present

## 2021-12-16 DIAGNOSIS — R7881 Bacteremia: Secondary | ICD-10-CM | POA: Diagnosis not present

## 2021-12-16 DIAGNOSIS — E118 Type 2 diabetes mellitus with unspecified complications: Secondary | ICD-10-CM | POA: Diagnosis not present

## 2021-12-16 DIAGNOSIS — Z96643 Presence of artificial hip joint, bilateral: Secondary | ICD-10-CM | POA: Diagnosis present

## 2021-12-16 DIAGNOSIS — L89300 Pressure ulcer of unspecified buttock, unstageable: Secondary | ICD-10-CM | POA: Diagnosis present

## 2021-12-16 DIAGNOSIS — R32 Unspecified urinary incontinence: Secondary | ICD-10-CM | POA: Diagnosis present

## 2021-12-16 DIAGNOSIS — R7989 Other specified abnormal findings of blood chemistry: Secondary | ICD-10-CM | POA: Diagnosis present

## 2021-12-16 DIAGNOSIS — I959 Hypotension, unspecified: Secondary | ICD-10-CM | POA: Diagnosis present

## 2021-12-16 DIAGNOSIS — G9341 Metabolic encephalopathy: Secondary | ICD-10-CM

## 2021-12-16 DIAGNOSIS — J90 Pleural effusion, not elsewhere classified: Secondary | ICD-10-CM | POA: Diagnosis not present

## 2021-12-16 DIAGNOSIS — Z1152 Encounter for screening for COVID-19: Secondary | ICD-10-CM

## 2021-12-16 DIAGNOSIS — E119 Type 2 diabetes mellitus without complications: Secondary | ICD-10-CM | POA: Diagnosis not present

## 2021-12-16 DIAGNOSIS — E785 Hyperlipidemia, unspecified: Secondary | ICD-10-CM | POA: Diagnosis present

## 2021-12-16 DIAGNOSIS — R4182 Altered mental status, unspecified: Secondary | ICD-10-CM | POA: Diagnosis not present

## 2021-12-16 DIAGNOSIS — Z7901 Long term (current) use of anticoagulants: Secondary | ICD-10-CM

## 2021-12-16 DIAGNOSIS — D631 Anemia in chronic kidney disease: Secondary | ICD-10-CM | POA: Diagnosis not present

## 2021-12-16 DIAGNOSIS — R079 Chest pain, unspecified: Secondary | ICD-10-CM | POA: Diagnosis not present

## 2021-12-16 DIAGNOSIS — I739 Peripheral vascular disease, unspecified: Secondary | ICD-10-CM | POA: Diagnosis present

## 2021-12-16 DIAGNOSIS — E1122 Type 2 diabetes mellitus with diabetic chronic kidney disease: Secondary | ICD-10-CM | POA: Diagnosis present

## 2021-12-16 DIAGNOSIS — I272 Pulmonary hypertension, unspecified: Secondary | ICD-10-CM | POA: Diagnosis present

## 2021-12-16 DIAGNOSIS — J9600 Acute respiratory failure, unspecified whether with hypoxia or hypercapnia: Secondary | ICD-10-CM | POA: Diagnosis not present

## 2021-12-16 DIAGNOSIS — Z66 Do not resuscitate: Secondary | ICD-10-CM | POA: Diagnosis present

## 2021-12-16 DIAGNOSIS — E1151 Type 2 diabetes mellitus with diabetic peripheral angiopathy without gangrene: Secondary | ICD-10-CM | POA: Diagnosis present

## 2021-12-16 DIAGNOSIS — I4821 Permanent atrial fibrillation: Secondary | ICD-10-CM | POA: Diagnosis present

## 2021-12-16 DIAGNOSIS — I132 Hypertensive heart and chronic kidney disease with heart failure and with stage 5 chronic kidney disease, or end stage renal disease: Secondary | ICD-10-CM | POA: Diagnosis present

## 2021-12-16 DIAGNOSIS — L304 Erythema intertrigo: Secondary | ICD-10-CM | POA: Diagnosis present

## 2021-12-16 DIAGNOSIS — Z91158 Patient's noncompliance with renal dialysis for other reason: Secondary | ICD-10-CM

## 2021-12-16 DIAGNOSIS — Z9981 Dependence on supplemental oxygen: Secondary | ICD-10-CM

## 2021-12-16 DIAGNOSIS — I252 Old myocardial infarction: Secondary | ICD-10-CM

## 2021-12-16 DIAGNOSIS — I255 Ischemic cardiomyopathy: Secondary | ICD-10-CM | POA: Diagnosis present

## 2021-12-16 DIAGNOSIS — Z91199 Patient's noncompliance with other medical treatment and regimen due to unspecified reason: Secondary | ICD-10-CM

## 2021-12-16 DIAGNOSIS — E8729 Other acidosis: Secondary | ICD-10-CM | POA: Diagnosis present

## 2021-12-16 DIAGNOSIS — R0689 Other abnormalities of breathing: Secondary | ICD-10-CM | POA: Diagnosis not present

## 2021-12-16 DIAGNOSIS — L8941 Pressure ulcer of contiguous site of back, buttock and hip, stage 1: Secondary | ICD-10-CM | POA: Diagnosis not present

## 2021-12-16 DIAGNOSIS — Z9049 Acquired absence of other specified parts of digestive tract: Secondary | ICD-10-CM

## 2021-12-16 DIAGNOSIS — D696 Thrombocytopenia, unspecified: Secondary | ICD-10-CM | POA: Diagnosis not present

## 2021-12-16 DIAGNOSIS — L249 Irritant contact dermatitis, unspecified cause: Secondary | ICD-10-CM | POA: Diagnosis present

## 2021-12-16 DIAGNOSIS — Z888 Allergy status to other drugs, medicaments and biological substances status: Secondary | ICD-10-CM

## 2021-12-16 DIAGNOSIS — Z7189 Other specified counseling: Secondary | ICD-10-CM | POA: Diagnosis not present

## 2021-12-16 DIAGNOSIS — I5043 Acute on chronic combined systolic (congestive) and diastolic (congestive) heart failure: Secondary | ICD-10-CM | POA: Diagnosis present

## 2021-12-16 DIAGNOSIS — R29898 Other symptoms and signs involving the musculoskeletal system: Secondary | ICD-10-CM | POA: Diagnosis not present

## 2021-12-16 DIAGNOSIS — J9621 Acute and chronic respiratory failure with hypoxia: Secondary | ICD-10-CM | POA: Diagnosis present

## 2021-12-16 DIAGNOSIS — L8990 Pressure ulcer of unspecified site, unspecified stage: Secondary | ICD-10-CM | POA: Diagnosis present

## 2021-12-16 DIAGNOSIS — G4733 Obstructive sleep apnea (adult) (pediatric): Secondary | ICD-10-CM | POA: Diagnosis present

## 2021-12-16 DIAGNOSIS — J811 Chronic pulmonary edema: Secondary | ICD-10-CM | POA: Diagnosis not present

## 2021-12-16 DIAGNOSIS — Z79899 Other long term (current) drug therapy: Secondary | ICD-10-CM

## 2021-12-16 DIAGNOSIS — Z955 Presence of coronary angioplasty implant and graft: Secondary | ICD-10-CM

## 2021-12-16 DIAGNOSIS — E8770 Fluid overload, unspecified: Secondary | ICD-10-CM | POA: Diagnosis not present

## 2021-12-16 DIAGNOSIS — R001 Bradycardia, unspecified: Secondary | ICD-10-CM | POA: Diagnosis present

## 2021-12-16 DIAGNOSIS — Z8546 Personal history of malignant neoplasm of prostate: Secondary | ICD-10-CM

## 2021-12-16 DIAGNOSIS — I9589 Other hypotension: Secondary | ICD-10-CM | POA: Diagnosis present

## 2021-12-16 DIAGNOSIS — R0902 Hypoxemia: Secondary | ICD-10-CM | POA: Diagnosis not present

## 2021-12-16 DIAGNOSIS — Z993 Dependence on wheelchair: Secondary | ICD-10-CM

## 2021-12-16 DIAGNOSIS — Z515 Encounter for palliative care: Secondary | ICD-10-CM | POA: Diagnosis not present

## 2021-12-16 DIAGNOSIS — R0789 Other chest pain: Secondary | ICD-10-CM | POA: Diagnosis not present

## 2021-12-16 HISTORY — DX: Ischemic cardiomyopathy: I25.5

## 2021-12-16 HISTORY — DX: Chronic systolic (congestive) heart failure: I50.22

## 2021-12-16 LAB — COMPREHENSIVE METABOLIC PANEL
ALT: 7 U/L (ref 0–44)
AST: 16 U/L (ref 15–41)
Albumin: 3.4 g/dL — ABNORMAL LOW (ref 3.5–5.0)
Alkaline Phosphatase: 70 U/L (ref 38–126)
Anion gap: 10 (ref 5–15)
BUN: 31 mg/dL — ABNORMAL HIGH (ref 8–23)
CO2: 28 mmol/L (ref 22–32)
Calcium: 9.5 mg/dL (ref 8.9–10.3)
Chloride: 103 mmol/L (ref 98–111)
Creatinine, Ser: 3.89 mg/dL — ABNORMAL HIGH (ref 0.61–1.24)
GFR, Estimated: 15 mL/min — ABNORMAL LOW (ref >60)
Glucose, Bld: 101 mg/dL — ABNORMAL HIGH (ref 70–99)
Potassium: 3.7 mmol/L (ref 3.5–5.1)
Sodium: 141 mmol/L (ref 135–145)
Total Bilirubin: 1.3 mg/dL — ABNORMAL HIGH (ref 0.3–1.2)
Total Protein: 6.1 g/dL — ABNORMAL LOW (ref 6.5–8.1)

## 2021-12-16 LAB — CBC WITH DIFFERENTIAL/PLATELET
Abs Immature Granulocytes: 0.03 10*3/uL (ref 0.00–0.07)
Basophils Absolute: 0 10*3/uL (ref 0.0–0.1)
Basophils Relative: 0 %
Eosinophils Absolute: 0.1 10*3/uL (ref 0.0–0.5)
Eosinophils Relative: 1 %
HCT: 42.1 % (ref 39.0–52.0)
Hemoglobin: 12 g/dL — ABNORMAL LOW (ref 13.0–17.0)
Immature Granulocytes: 1 %
Lymphocytes Relative: 32 %
Lymphs Abs: 2.2 10*3/uL (ref 0.7–4.0)
MCH: 31.3 pg (ref 26.0–34.0)
MCHC: 28.5 g/dL — ABNORMAL LOW (ref 30.0–36.0)
MCV: 109.9 fL — ABNORMAL HIGH (ref 80.0–100.0)
Monocytes Absolute: 0.5 10*3/uL (ref 0.1–1.0)
Monocytes Relative: 8 %
Neutro Abs: 3.9 10*3/uL (ref 1.7–7.7)
Neutrophils Relative %: 58 %
Platelets: 80 10*3/uL — ABNORMAL LOW (ref 150–400)
RBC: 3.83 MIL/uL — ABNORMAL LOW (ref 4.22–5.81)
RDW: 20.6 % — ABNORMAL HIGH (ref 11.5–15.5)
WBC: 6.7 10*3/uL (ref 4.0–10.5)
nRBC: 0.5 % — ABNORMAL HIGH (ref 0.0–0.2)

## 2021-12-16 LAB — LACTIC ACID, PLASMA: Lactic Acid, Venous: 1.4 mmol/L (ref 0.5–1.9)

## 2021-12-16 LAB — PROTIME-INR
INR: 1.4 — ABNORMAL HIGH (ref 0.8–1.2)
Prothrombin Time: 16.7 seconds — ABNORMAL HIGH (ref 11.4–15.2)

## 2021-12-16 LAB — ACETAMINOPHEN LEVEL: Acetaminophen (Tylenol), Serum: <10 ug/mL — ABNORMAL LOW (ref 10–30)

## 2021-12-16 LAB — RESP PANEL BY RT-PCR (FLU A&B, COVID) ARPGX2
Influenza A by PCR: NEGATIVE
Influenza B by PCR: NEGATIVE
SARS Coronavirus 2 by RT PCR: NEGATIVE

## 2021-12-16 LAB — PROCALCITONIN: Procalcitonin: 0.14 ng/mL

## 2021-12-16 LAB — TROPONIN I (HIGH SENSITIVITY): Troponin I (High Sensitivity): 66 ng/L — ABNORMAL HIGH (ref <18)

## 2021-12-16 LAB — SALICYLATE LEVEL: Salicylate Lvl: <7 mg/dL — ABNORMAL LOW (ref 7.0–30.0)

## 2021-12-16 LAB — ETHANOL: Alcohol, Ethyl (B): <10 mg/dL (ref <10)

## 2021-12-16 LAB — AMMONIA: Ammonia: 36 umol/L — ABNORMAL HIGH (ref 9–35)

## 2021-12-16 LAB — APTT: aPTT: 33 seconds (ref 24–36)

## 2021-12-16 MED ORDER — ARTIFICIAL TEARS OPHTHALMIC OINT
TOPICAL_OINTMENT | Freq: Two times a day (BID) | OPHTHALMIC | Status: DC
  Filled 2021-12-16: qty 3.5

## 2021-12-16 MED ORDER — SODIUM CHLORIDE 0.9% FLUSH
3.0000 mL | Freq: Two times a day (BID) | INTRAVENOUS | Status: DC
  Administered 2021-12-17 – 2021-12-20 (×7): 3 mL via INTRAVENOUS

## 2021-12-16 MED ORDER — SODIUM CHLORIDE 0.9 % IV SOLN: 250.0000 mL | INTRAVENOUS | Status: DC | PRN

## 2021-12-16 MED ORDER — VITAMIN B-12 1000 MCG PO TABS
1000.0000 ug | ORAL_TABLET | Freq: Every day | ORAL | Status: DC
  Administered 2021-12-17 – 2021-12-22 (×6): 1000 ug via ORAL
  Filled 2021-12-16 (×6): qty 1

## 2021-12-16 MED ORDER — CLOPIDOGREL BISULFATE 75 MG PO TABS
75.0000 mg | ORAL_TABLET | Freq: Every day | ORAL | Status: DC
  Administered 2021-12-17 – 2021-12-18 (×2): 75 mg via ORAL
  Filled 2021-12-16 (×2): qty 1

## 2021-12-16 MED ORDER — CALCIUM ACETATE (PHOS BINDER) 667 MG PO CAPS
1334.0000 mg | ORAL_CAPSULE | Freq: Three times a day (TID) | ORAL | Status: DC
  Administered 2021-12-17 – 2021-12-22 (×15): 1334 mg via ORAL
  Filled 2021-12-16 (×16): qty 2

## 2021-12-16 MED ORDER — PENTAFLUOROPROP-TETRAFLUOROETH EX AERO: 1.0000 | INHALATION_SPRAY | CUTANEOUS | Status: DC | PRN

## 2021-12-16 MED ORDER — CALCITRIOL 0.25 MCG PO CAPS
0.2500 ug | ORAL_CAPSULE | Freq: Every day | ORAL | Status: DC
  Administered 2021-12-17 – 2021-12-22 (×5): 0.25 ug via ORAL
  Filled 2021-12-16 (×6): qty 1

## 2021-12-16 MED ORDER — FOLIC ACID 1 MG PO TABS
1.0000 mg | ORAL_TABLET | Freq: Every day | ORAL | Status: DC
  Administered 2021-12-17 – 2021-12-22 (×6): 1 mg via ORAL
  Filled 2021-12-16 (×6): qty 1

## 2021-12-16 MED ORDER — CHLORHEXIDINE GLUCONATE CLOTH 2 % EX PADS
6.0000 | MEDICATED_PAD | Freq: Every day | CUTANEOUS | Status: DC
  Administered 2021-12-18 – 2021-12-22 (×3): 6 via TOPICAL
  Filled 2021-12-16 (×2): qty 6

## 2021-12-16 MED ORDER — ALBUMIN HUMAN 25 % IV SOLN
25.0000 g | Freq: Once | INTRAVENOUS | Status: AC
  Administered 2021-12-16: 25 g via INTRAVENOUS
  Filled 2021-12-16: qty 100

## 2021-12-16 MED ORDER — PREGABALIN 75 MG PO CAPS
75.0000 mg | ORAL_CAPSULE | Freq: Every day | ORAL | Status: DC
  Administered 2021-12-17 – 2021-12-21 (×5): 75 mg via ORAL
  Filled 2021-12-16 (×5): qty 1

## 2021-12-16 MED ORDER — ACETAMINOPHEN 325 MG PO TABS
650.0000 mg | ORAL_TABLET | Freq: Four times a day (QID) | ORAL | Status: DC | PRN
  Administered 2021-12-19 – 2021-12-21 (×3): 650 mg via ORAL
  Filled 2021-12-16 (×3): qty 2

## 2021-12-16 MED ORDER — HEPARIN SODIUM (PORCINE) 1000 UNIT/ML DIALYSIS: 1000.0000 [IU] | INTRAMUSCULAR | Status: DC | PRN

## 2021-12-16 MED ORDER — ONDANSETRON HCL 4 MG/2ML IJ SOLN: 4.0000 mg | Freq: Four times a day (QID) | INTRAMUSCULAR | Status: DC | PRN

## 2021-12-16 MED ORDER — LACTULOSE 10 GM/15ML PO SOLN
10.0000 g | Freq: Two times a day (BID) | ORAL | Status: DC | PRN
  Administered 2021-12-19 – 2021-12-21 (×3): 10 g via ORAL
  Filled 2021-12-16 (×3): qty 30

## 2021-12-16 MED ORDER — SODIUM CHLORIDE 0.9% FLUSH: 3.0000 mL | INTRAVENOUS | Status: DC | PRN

## 2021-12-16 MED ORDER — LIDOCAINE-PRILOCAINE 2.5-2.5 % EX CREA: 1.0000 | TOPICAL_CREAM | CUTANEOUS | Status: DC | PRN

## 2021-12-16 MED ORDER — ATORVASTATIN CALCIUM 20 MG PO TABS
40.0000 mg | ORAL_TABLET | Freq: Every day | ORAL | Status: DC
  Administered 2021-12-17 – 2021-12-21 (×5): 40 mg via ORAL
  Filled 2021-12-16 (×5): qty 2

## 2021-12-16 MED ORDER — POLYVINYL ALCOHOL 1.4 % OP SOLN: 1.0000 [drp] | Freq: Three times a day (TID) | OPHTHALMIC | Status: DC | PRN

## 2021-12-16 MED ORDER — ROPINIROLE HCL 1 MG PO TABS
1.0000 mg | ORAL_TABLET | Freq: Every day | ORAL | Status: DC
  Administered 2021-12-17 – 2021-12-21 (×5): 1 mg via ORAL
  Filled 2021-12-16 (×6): qty 1

## 2021-12-16 MED ORDER — SEVELAMER CARBONATE 800 MG PO TABS
1600.0000 mg | ORAL_TABLET | ORAL | Status: DC
  Administered 2021-12-17 – 2021-12-22 (×10): 1600 mg via ORAL
  Filled 2021-12-16 (×10): qty 2

## 2021-12-16 MED ORDER — CARBOXYMETHYLCELLULOSE SODIUM 1 % OP GEL: 1.0000 [drp] | Freq: Two times a day (BID) | OPHTHALMIC | Status: DC

## 2021-12-16 MED ORDER — ALTEPLASE 2 MG IJ SOLR: 2.0000 mg | Freq: Once | INTRAMUSCULAR | Status: DC | PRN

## 2021-12-16 MED ORDER — MIDODRINE HCL 5 MG PO TABS
10.0000 mg | ORAL_TABLET | Freq: Three times a day (TID) | ORAL | Status: DC
  Administered 2021-12-17 (×2): 10 mg via ORAL
  Filled 2021-12-16 (×2): qty 2

## 2021-12-16 MED ORDER — ONDANSETRON HCL 4 MG PO TABS: 4.0000 mg | ORAL_TABLET | Freq: Four times a day (QID) | ORAL | Status: DC | PRN

## 2021-12-16 MED ORDER — SERTRALINE HCL 50 MG PO TABS
50.0000 mg | ORAL_TABLET | Freq: Every day | ORAL | Status: DC
  Administered 2021-12-17 – 2021-12-22 (×6): 50 mg via ORAL
  Filled 2021-12-16 (×6): qty 1

## 2021-12-16 MED ORDER — LIDOCAINE HCL (PF) 1 % IJ SOLN: 5.0000 mL | INTRAMUSCULAR | Status: DC | PRN

## 2021-12-16 MED ORDER — ANTICOAGULANT SODIUM CITRATE 4% (200MG/5ML) IV SOLN: 5.0000 mL | Status: DC | PRN

## 2021-12-16 NOTE — Assessment & Plan Note (Signed)
Most likely secondary to uncompensated respiratory acidosis with hypercapnia. Expect improvement in patient's mental status following resolution of hypercapnia

## 2021-12-16 NOTE — ED Triage Notes (Signed)
Pt from dialysis via EMS.  Pt from [facility]. No dialysis was done today.  Reports of CP/pressure x a few days   pt does not respond to questioning on arrival however.  Pt was hypotensive in the 80s with EMS. IV access established and EDP at bedside on arrival to room in ED.

## 2021-12-16 NOTE — Assessment & Plan Note (Signed)
Continue midodrine  

## 2021-12-16 NOTE — Assessment & Plan Note (Signed)
Patient noted to be bradycardic on the monitor Most likely secondary to obstructive sleep apnea from obesity as well as iatrogenic from metoprolol use. Hold metoprolol for now Continue BiPAP Check TSH

## 2021-12-16 NOTE — Assessment & Plan Note (Signed)
Patient has no unstageable pressure ulcers over the gluteal region. We will request wound care consult

## 2021-12-16 NOTE — Assessment & Plan Note (Addendum)
Patient with complaints of chest pressure and has a history of coronary artery disease as well as peripheral arterial disease. Initial troponin is elevated and of unclear significance may be related to hypotension Cycle cardiac enzymes Obtain 2D echocardiogram to assess LVEF and rule out regional wall motion abnormality Continue Plavix and atorvastatin

## 2021-12-16 NOTE — Assessment & Plan Note (Signed)
Patient has a history of end-stage renal disease and is on hemodialysis Dialysis days are Tuesday, Thursday and Saturday He has a history of noncompliance with renal replacement therapy Consult nephrology

## 2021-12-16 NOTE — Assessment & Plan Note (Signed)
Patient is currently on BiPAP and unable to provide any history but chest x-ray shows bilateral pleural effusions (Rt > Lt) with pulmonary vascular congestion. LVEF is 40 to 45% from a 2D echocardiogram which was done 12 /22 Continue renal replacement therapy to optimize volume status Will need repeat imaging and if pleural effusion persists especially on the right may benefit from thoracentesis. Hold metoprolol due to bradycardia

## 2021-12-16 NOTE — H&P (Signed)
History and Physical    Patient: Antonio Phillips HCW:[id number] DOB: May 15, 1945 DOA: 12/16/2021 DOS: the patient was seen and examined on 12/16/2021 PCP: Marsh Dolly, MD  Patient coming from: [facility] skilled nursing facility  Chief Complaint:  Chief Complaint  Patient presents with   Altered Mental Status   Chest Pain   HPI: Antonio Phillips is a 76 y.o. 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 It is unclear what patient's baseline mental status is but during his last hospitalization he was said to have intermittent episodes of confusion but was verbally responsive.  Today in the emergency room patient nods his head to questions and does not respond verbally. Labs on admission show uncompensated respiratory acidosis with hypercapnia.  During my evaluation patient is currently on BiPAP and is also receiving renal replacement therapy. Noted to be bradycardic on the cardiac monitor. I am able to do review of systems on this patient due to his mental status. He will be admitted to the hospital for further evaluation     Review of Systems: As mentioned in the history of present illness. All other systems reviewed and are negative. Past Medical History:  Diagnosis Date   Coronary artery disease    Demand ischemia    a. 11/2017 elev Trop in setting of AFib/SVT-->Echo [facility]): EF>55%. Mild LVH. Nl RV fxn.   Diabetes ([place])    a. Pt states that he has no hx of diabetes--A1c 5.5 11/2018.   ESRD (end stage renal disease) ([place])    a.  On HD since 2019.   Hyperlipidemia    Hypotension    a. On midodrine.   Morbid obesity ([place])    PAD (peripheral artery disease) ([place])    a. s/p L AKA and R BKA.   Permanent atrial fibrillation ([place])    a. Dx 2019. CHA2DS2VASc = 3-4-->Eliquis.   Past Surgical History:  Procedure Laterality Date   AV FISTULA REPAIR     BELOW KNEE LEG AMPUTATION Bilateral    CHOLECYSTECTOMY     ERCP N/A 02/04/2021   Procedure: ENDOSCOPIC RETROGRADE CHOLANGIOPANCREATOGRAPHY (ERCP);  Surgeon: Lucilla Lame, MD;  Location: [facility] ENDOSCOPY;  Service: Endoscopy;  Laterality: N/A;   LEFT HEART CATH AND CORONARY ANGIOGRAPHY N/A 09/22/2020   Procedure: LEFT HEART CATH AND CORONARY ANGIOGRAPHY;  Surgeon: Sherren Mocha, MD;  Location: [place] CV LAB;  Service: Cardiovascular;  Laterality: N/A;   TOTAL HIP ARTHROPLASTY Bilateral    Social History:  reports that he has never smoked. He has never used smokeless tobacco. He reports that he does not drink alcohol and does not use drugs.  Allergies  Allergen Reactions   Simvastatin Other (See Comments)    No family history on file.  Prior to Admission medications   Medication Sig Start Date End Date Taking? Authorizing Provider  acetaminophen (TYLENOL) 325 MG tablet Take 2 tablets (650 mg total) by mouth every 6 (six) hours as needed for mild pain or moderate pain. 01/17/20   Loletha Grayer, MD  atorvastatin (LIPITOR) 40 MG tablet Take 40 mg by mouth at bedtime.  09/17/20   [provider]  calcitRIOL (ROCALTROL) 0.25 MCG capsule Take 1 tablet by mouth daily. 10/04/21   [provider]  calcium acetate (PHOSLO) 667 MG capsule Take 1,334 mg by mouth 3 (three) times daily. 12/02/21   [provider]  Carboxymethylcellulose Sod PF 0.5 % SOLN Place 1 drop into both eyes 3 (three) times daily as needed (dry eyes).    [provider]  clopidogrel (PLAVIX) 75 MG tablet Take 1 tablet (75 mg total) by mouth daily with breakfast. 09/23/20    Loletha Grayer, MD  diclofenac Sodium (VOLTAREN) 1 % GEL Apply 2 g topically in the morning. (Apply to right stump)    [provider]  folic acid (FOLVITE) 1 MG tablet Take 1 mg by mouth daily. 06/21/21   [provider]  lactulose, encephalopathy, (CHRONULAC) 10 GM/15ML SOLN Take 10 g by mouth 2 (two) times daily as needed (mild constipation).    [provider]  metoprolol tartrate (LOPRESSOR) 25 MG tablet Take 0.5 tablets (12.5 mg total) by mouth 2 (two) times daily. Hold on dialysis days.  Other days hold if sbp <90 or HR <60 12/07/21   Nolberto Hanlon, MD  midodrine (PROAMATINE) 10 MG tablet Take 1 tablet (10 mg total) by mouth 3 (three) times daily with meals. 12/07/21 01/06/22  Nolberto Hanlon, MD  mupirocin ointment (BACTROBAN) 2 % Place 1 Application into the nose 2 (two) times daily. 12/07/21   Nolberto Hanlon, MD  pregabalin (LYRICA) 100 MG capsule Take 1 capsule (100 mg total) by mouth 2 (two) times daily. Patient taking differently: Take 75 mg by mouth at bedtime. 02/09/21   Lorella Nimrod, MD  REFRESH LIQUIGEL 1 % GEL Place 1 drop into both eyes in the morning and at bedtime. 11/24/21   [provider]  rOPINIRole (REQUIP) 1 MG tablet Take 1 mg by mouth at bedtime. 03/13/19   [provider]  sertraline (ZOLOFT) 50 MG tablet Take 50 mg by mouth daily. 12/02/21   [provider]  sevelamer carbonate (RENVELA) 800 MG tablet Take 1,600 mg by mouth See admin instructions. Take 2 tablets (1600mg ) by mouth three times daily with meals on Monday, Wednesday, Friday and Sunday    [provider]  vitamin B-12 (CYANOCOBALAMIN) 1000 MCG tablet Take 1,000 mcg by mouth daily.    [provider]    Physical Exam: Vitals:   12/16/21 1330 12/16/21 1335 12/16/21 1400 12/16/21 1541  BP: 94/66  93/63 104/71  Pulse: 89 69 (!) 54 95  Resp: 11  (!) 9 17  Temp:    98.3 F (36.8 C)  TempSrc:    Axillary  SpO2: 98% 95% 95% 93%  Weight:       Height:       Physical Exam Vitals and nursing note reviewed.  Constitutional:      Appearance: He is obese.     Comments: Lethargic but opens eyes to loud verbal stimuli.  No verbal response, nods his head in response to questions  HENT:     Head: Normocephalic and atraumatic.  Eyes:     Comments: Pale conjunctiva  Cardiovascular:     Rate and Rhythm: Regular rhythm. Bradycardia present.  Pulmonary:     Effort: Pulmonary effort is normal.     Breath sounds: Examination of the right-lower field reveals rales. Examination of the left-lower field reveals rales. Rales present.  Abdominal:     General: Bowel sounds are normal.     Palpations:  Abdomen is soft.     Comments: Central adiposity  Musculoskeletal:     Cervical back: Normal range of motion and neck supple.     Comments: Right BKA, left AKA  Skin:    General: Skin is warm and dry.  Neurological:     Comments: Unable to assess  Psychiatric:     Comments: Unable to assess     Data Reviewed: Relevant notes from primary care and specialist visits, past discharge summaries as available in EHR, including Care Everywhere. Prior diagnostic testing as pertinent to current admission diagnoses Updated medications and problem lists for reconciliation ED course, including vitals, labs, imaging, treatment and response to treatment Triage notes, nursing and pharmacy notes and ED provider's notes Notable results as noted in HPI Labs reviewed.  Sodium 141, potassium 3.7, chloride 103, bicarb 28, glucose 101, BUN 31, creatinine 3.89, calcium 9.5, total protein 6.1, albumin 3.4, AST 16, ALT 7, alkaline phosphatase 70, total bilirubin 1.3, troponin 66, procalcitonin 0.14, ammonia 36, PT 16.7, INR 1.3, white count 6.7, hemoglobin 12.0, hematocrit 42.1, platelet count 80 CT scan of the head without contrast shows No acute intracranial abnormality.Large bilateral mastoid effusions with thinning of the tegmen tympani and likely erosion through  the roof of the mastoid air cells on the left. Consider further evaluation with a contrast enhanced temporal bone CT if the patient has symptoms of otomastoiditis. Chest x-ray reviewed by me shows Cardiomegaly with increased pulmonary vascular congestion, enlarging moderate right and possible small left pleural effusions, and bibasilar atelectasis or consolidation Twelve-lead EKG reviewed by me shows atrial fibrillation. There are no new results to review at this time.  Assessment and Plan: * Acute metabolic encephalopathy Most likely secondary to uncompensated respiratory acidosis with hypercapnia. Expect improvement in patient's mental status following resolution of hypercapnia  Acute on chronic respiratory failure with hypoxia and hypercapnia ([place]) Patient presents for evaluation of mental status changes and had a venous blood gas which showed uncompensated respiratory acidosis. At baseline he usually wears 2 L of oxygen continuous Continue BiPAP initiated in the ER Repeat arterial blood gas 2 hours post BiPAP We will attempt to wean patient off BiPAP once acute illness improves or resolves  Sinus bradycardia Patient noted to be bradycardic on the monitor Most likely secondary to obstructive sleep apnea from obesity as well as iatrogenic from metoprolol use. Hold metoprolol for now Continue BiPAP Check TSH  Hypotension Continue midodrine  Acute on chronic systolic CHF (congestive heart failure) ([place]) Patient is currently on BiPAP and unable to provide any history but chest x-ray shows bilateral pleural effusions (Rt > Lt) with pulmonary vascular congestion. LVEF is 40 to 45% from a 2D echocardiogram which was done 12 /22 Continue renal replacement therapy to optimize volume status Will need repeat imaging and if pleural effusion persists especially on the right may benefit from thoracentesis. Hold metoprolol due to bradycardia  ESRD (end stage renal disease) ([place]) Patient has  a history of end-stage renal disease and is on hemodialysis Dialysis days are Tuesday, Thursday and Saturday He has a history of noncompliance with renal replacement therapy Consult nephrology  Pressure injury of skin Patient has no unstageable pressure ulcers over the gluteal region. We will request wound care consult  Thrombocytopenia (HCC) Chronic No evidence of bleeding at this time Monitor closely during this hospitalization  Elevated troponin Patient with complaints of chest pressure and has a history of coronary artery disease as well as peripheral arterial disease. Initial troponin is elevated and  of unclear significance may be related to hypotension Cycle cardiac enzymes Obtain 2D echocardiogram to assess LVEF and rule out regional wall motion abnormality Continue Plavix and atorvastatin  Morbid obesity (HCC) BMI 62.22 Complicates overall prognosis and care  Permanent atrial fibrillation (HCC) Hold metoprolol due to bradycardia Not on long-term anticoagulation due to thrombocytopenia with increased risk for bleeding  PVD (peripheral vascular disease) ([place]) Status post right BKA and left AKA Continue Plavix and atorvastatin      Advance Care Planning:   Code Status: DNR .  Patient does not have his universal DNR but confirmed with the supervisor at the skilled nursing facility, Ms Doyle Askew.  He is a DO NOT RESUSCITATE   Consults: Cardiology, nephrology, palliative care, wound care  Family Communication: Attempted to contact patient's emergency contact, Ms Bobbye [place].  Voicemail not set up at this time.  Severity of Illness: The appropriate patient status for this patient is INPATIENT. Inpatient status is judged to be reasonable and necessary in order to provide the required intensity of service to ensure the patient's safety. The patient's presenting symptoms, physical exam findings, and initial radiographic and laboratory data in the context of their chronic  comorbidities is felt to place them at high risk for further clinical deterioration. Furthermore, it is not anticipated that the patient will be medically stable for discharge from the hospital within 2 midnights of admission.   * I certify that at the point of admission it is my clinical judgment that the patient will require inpatient hospital care spanning beyond 2 midnights from the point of admission due to high intensity of service, high risk for further deterioration and high frequency of surveillance required.*  Author: Collier Bullock, MD 12/16/2021 4:56 PM  For on call review [email or web address].

## 2021-12-16 NOTE — Progress Notes (Signed)
[facility]  ROUNDING NOTE   Subjective:   Antonio Phillips is a 76 year old male with past medical conditions including diabetes, hyperlipidemia, CAD, hypertension, atrial fibrillation on Eliquis, and end-stage renal disease on hemodialysis.  Patient presents to the emergency department from his dialysis clinic with complaints of chest pain/pressure, hypotension, and decreased mentation.  Patient has been admitted for Acute metabolic encephalopathy [N23.55]  Patient is known to our practice and receives outpatient dialysis treatments at [facility] on a TTS schedule, supervised by Dr. Candiss Norse.  Patient currently laying on stretcher, ill-appearing.  BiPAP in place.  Patient remains somnolent.  Opens eyes to name but too drowsy to answer questions.  Chart review use to obtain history.  Appears patient stated he has had chest pain and pressure for the past few days.  He is currently a resident at [facility].  Labs on ED arrival include potassium including potassium 3.7, BUN 31, creatinine 3.89 with GFR 15, albumin 3.4, hemoglobin 12.0, INR 1.4.  Respiratory panel negative for influenza and COVID-19.  Blood cultures pending.  CT head shows large bilateral mastoid effusions.  Chest x-ray shows increased pulmonary vascular congestion and pleural effusions.  ABG shows pH 7.16 with CO2 86, and bicarb 30.7.  Patient placed on BiPAP.  We have been consulted to manage dialysis needs.   Objective:  Vital signs in last 24 hours:  Temp:  [98.3 F (36.8 C)] 98.3 F (36.8 C) (10/12 1541) Pulse Rate:  [54-95] 95 (10/12 1541) Resp:  [9-17] 17 (10/12 1541) BP: (87-104)/(56-71) 104/71 (10/12 1541) SpO2:  [93 %-100 %] 93 % (10/12 1541) Weight:  [96.2 kg] 96.2 kg (10/12 1238)  Weight change:  Filed Weights   12/16/21 1238  Weight: 96.2 kg    Intake/Output: No intake/output data recorded.   Intake/Output this shift:  No intake/output data recorded.  Physical  Exam: General: Ill-appearing, somnolent  Head: Normocephalic, atraumatic.   Eyes: Anicteric  Lungs:  BiPAP in place  Heart: Irregular rate and rhythm  Abdomen:  Soft, nontender, obese  Extremities: 2+ peripheral edema.  Neurologic: Nonfocal, moving all four extremities  Skin: No lesions  Access: Left upper aVF    Basic Metabolic Panel: Recent Labs  Lab 12/16/21 1224  NA 141  K 3.7  CL 103  CO2 28  GLUCOSE 101*  BUN 31*  CREATININE 3.89*  CALCIUM 9.5    Liver Function Tests: Recent Labs  Lab 12/16/21 1224  AST 16  ALT 7  ALKPHOS 70  BILITOT 1.3*  PROT 6.1*  ALBUMIN 3.4*   No results for input(s): "LIPASE", "AMYLASE" in the last 168 hours. Recent Labs  Lab 12/16/21 1224  AMMONIA 36*    CBC: Recent Labs  Lab 12/16/21 1224  WBC 6.7  NEUTROABS 3.9  HGB 12.0*  HCT 42.1  MCV 109.9*  PLT 80*    Cardiac Enzymes: No results for input(s): "CKTOTAL", "CKMB", "CKMBINDEX", "TROPONINI" in the last 168 hours.  BNP: Invalid input(s): "POCBNP"  CBG: No results for input(s): "GLUCAP" in the last 168 hours.  Microbiology: Results for orders placed or performed during the hospital encounter of 12/16/21  Resp Panel by RT-PCR (Flu A&B, Covid) Anterior Nasal Swab     Status: None   Collection Time: 12/16/21  1:10 PM   Specimen: Anterior Nasal Swab  Result Value Ref Range Status   SARS Coronavirus 2 by RT PCR NEGATIVE NEGATIVE Final    Comment: (NOTE) SARS-CoV-2 target nucleic acids are NOT DETECTED.  The  SARS-CoV-2 RNA is generally detectable in upper respiratory specimens during the acute phase of infection. The lowest concentration of SARS-CoV-2 viral copies this assay can detect is 138 copies/mL. A negative result does not preclude SARS-Cov-2 infection and should not be used as the sole basis for treatment or other patient management decisions. A negative result may occur with  improper specimen collection/handling, submission of specimen other than  nasopharyngeal swab, presence of viral mutation(s) within the areas targeted by this assay, and inadequate number of viral copies(<138 copies/mL). A negative result must be combined with clinical observations, patient history, and epidemiological information. The expected result is Negative.  Fact Sheet for Patients:  EntrepreneurPulse.com.au  Fact Sheet for Healthcare Providers:  IncredibleEmployment.be  This test is no t yet approved or cleared by the [place] FDA and  has been authorized for detection and/or diagnosis of SARS-CoV-2 by FDA under an Emergency Use Authorization (EUA). This EUA will remain  in effect (meaning this test can be used) for the duration of the COVID-19 declaration under Section 564(b)(1) of the Act, 21 U.S.C.section 360bbb-3(b)(1), unless the authorization is terminated  or revoked sooner.       Influenza A by PCR NEGATIVE NEGATIVE Final   Influenza B by PCR NEGATIVE NEGATIVE Final    Comment: (NOTE) The Xpert Xpress SARS-CoV-2/FLU/RSV plus assay is intended as an aid in the diagnosis of influenza from Nasopharyngeal swab specimens and should not be used as a sole basis for treatment. Nasal washings and aspirates are unacceptable for Xpert Xpress SARS-CoV-2/FLU/RSV testing.  Fact Sheet for Patients: EntrepreneurPulse.com.au  Fact Sheet for Healthcare Providers: IncredibleEmployment.be  This test is not yet approved or cleared by the [place] FDA and has been authorized for detection and/or diagnosis of SARS-CoV-2 by FDA under an Emergency Use Authorization (EUA). This EUA will remain in effect (meaning this test can be used) for the duration of the COVID-19 declaration under Section 564(b)(1) of the Act, 21 U.S.C. section 360bbb-3(b)(1), unless the authorization is terminated or revoked.  Performed at [facility], [address] [place], [place]  [address]     Coagulation Studies: Recent Labs    12/16/21 1224  LABPROT 16.7*  INR 1.4*    Urinalysis: No results for input(s): "COLORURINE", "LABSPEC", "PHURINE", "GLUCOSEU", "HGBUR", "BILIRUBINUR", "KETONESUR", "PROTEINUR", "UROBILINOGEN", "NITRITE", "LEUKOCYTESUR" in the last 72 hours.  Invalid input(s): "APPERANCEUR"    Imaging: CT Head Wo Contrast  Result Date: 12/16/2021 CLINICAL DATA:  Altered mental status EXAM: CT HEAD WITHOUT CONTRAST TECHNIQUE: Contiguous axial images were obtained from the base of the skull through the vertex without intravenous contrast. RADIATION DOSE REDUCTION: This exam was performed according to the departmental dose-optimization program which includes automated exposure control, adjustment of the mA and/or kV according to patient size and/or use of iterative reconstruction technique. COMPARISON:  CT T bone 12/04/21 FINDINGS: Brain: No evidence of acute infarction, hemorrhage, hydrocephalus, extra-axial collection or mass lesion/mass effect. There is mineralization of the basal ganglia bilaterally. Advanced generalized volume loss. Vascular: No hyperdense vessel or unexpected calcification. Skull: Normal. Negative for fracture or focal lesion. Sinuses/Orbits: Bilateral lens replacements. Large bilateral mastoid effusions with thinning of the tegmen tympani on the left. There is fluid in the middle ear cavity bilaterally. Other: None. IMPRESSION: 1. No acute intracranial abnormality. 2. Large bilateral mastoid effusions with thinning of the tegmen tympani and likely erosion through the roof of the mastoid air cells on the left. Consider further evaluation with a contrast enhanced temporal bone CT if the  patient has symptoms of otomastoiditis. Electronically Signed   By: Marin Roberts M.D.   On: 12/16/2021 13:33   DG Chest Port 1 View  Result Date: 12/16/2021 CLINICAL DATA:  Questionable sepsis - evaluate for abnormality. EXAM: PORTABLE CHEST 1 VIEW COMPARISON:   Chest radiograph 12/06/2021 FINDINGS: The cardiac silhouette remains enlarged. There is increased pulmonary vascular congestion. There is a moderate right pleural effusion which has increased in size. There may be a small left pleural effusion. Increased airspace opacities are present in the left greater than right lung bases. No pneumothorax is identified. No acute osseous abnormality is seen. IMPRESSION: Cardiomegaly with increased pulmonary vascular congestion, enlarging moderate right and possible small left pleural effusions, and bibasilar atelectasis or consolidation. Electronically Signed   By: Logan Bores M.D.   On: 12/16/2021 13:10     Medications:    sodium chloride     anticoagulant sodium citrate      atorvastatin  40 mg Oral QHS   calcitRIOL  0.25 mcg Oral Daily   calcium acetate  1,334 mg Oral TID   Carboxymethylcellulose Sodium  1 drop Both Eyes BID   [START ON 12/17/2021] Chlorhexidine Gluconate Cloth  6 each Topical Q0600   [START ON 12/17/2021] clopidogrel  75 mg Oral Q breakfast   cyanocobalamin  1,000 mcg Oral Daily   folic acid  1 mg Oral Daily   midodrine  10 mg Oral TID WC   pregabalin  75 mg Oral QHS   rOPINIRole  1 mg Oral QHS   sertraline  50 mg Oral Daily   sevelamer carbonate  1,600 mg Oral See admin instructions   sodium chloride flush  3 mL Intravenous Q12H   sodium chloride, acetaminophen, alteplase, anticoagulant sodium citrate, Carboxymethylcellulose Sod PF, heparin, lactulose (encephalopathy), lidocaine (PF), lidocaine-prilocaine, ondansetron **OR** ondansetron (ZOFRAN) IV, pentafluoroprop-tetrafluoroeth, sodium chloride flush  Assessment/ Plan:  Antonio Phillips is a 76 y.o.  male with past medical conditions including diabetes, hyperlipidemia, CAD, hypertension, atrial fibrillation on Eliquis, and end-stage renal disease on hemodialysis.  Patient presents to the emergency department from his dialysis clinic with complaints of chest pain/pressure,  hypotension, and decreased mentation.  Patient has been admitted for Acute metabolic encephalopathy [A12.87]  [place]/TTS/left AVF  Fluid volume overload with end-stage renal disease on hemodialysis.  Chest x-ray shows pulmonary vascular congestion.  Patient placed on BiPAP in ED.  We will perform urgent dialysis, UF goal 3 L as tolerated.  We will reassess patient in a.m. to determine additional need for dialysis.  2. Anemia of chronic kidney disease Lab Results  Component Value Date   HGB 12.0 (L) 12/16/2021    Hemoglobin within desired target.  We will continue to monitor.  Patient receives [place] outpatient.  3. Secondary Hyperparathyroidism: with outpatient labs: PTH 172, phosphorus 5.3, calcium 8.3.  Lab Results  Component Value Date   CALCIUM 9.5 12/16/2021   PHOS 4.4 12/05/2021    Calcium and phosphorus within acceptable range.  Patient receives sevelamer and calcium acetate outpatient with meals.  Also receives calcitriol daily.  4. 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 Most recent hemoglobin A1c is 5.5 on 11/10/18.     LOS: 0 Sybilla Malhotra 10/12/20234:34 PM

## 2021-12-16 NOTE — Assessment & Plan Note (Signed)
Chronic No evidence of bleeding at this time Monitor closely during this hospitalization

## 2021-12-16 NOTE — Progress Notes (Signed)
PT did 3.5 hrs of HD. Bp low, couldn't reach goal of 3028ml  UF =- 1000 ml  Report given to floor RN   12/16/21 2045  Vitals  Temp (!) 97.4 F (36.3 C)  Temp Source Oral  BP 94/67  MAP (mmHg) 77  BP Location Right Arm  BP Method Automatic  Patient Position (if appropriate) Lying  Pulse Rate 77  Pulse Rate Source Monitor  ECG Heart Rate (!) 51  Resp 14  Oxygen Therapy  SpO2 100 %  O2 Device Nasal Cannula  O2 Flow Rate (L/min) 4 L/min  Patient Activity (if Appropriate) In bed  Pulse Oximetry Type Continuous  Post Treatment  Dialyzer Clearance Clear  Duration of HD Treatment -hour(s) 3.5 hour(s)  Hemodialysis Intake (mL) 0 mL  Liters Processed 84  Fluid Removed 1000 mL  Tolerated HD Treatment Yes  Post-Hemodialysis Comments bp low, couldnt reach 3000 ml goal  AVG/AVF Arterial Site Held (minutes) 10 minutes  AVG/AVF Venous Site Held (minutes) 10 minutes

## 2021-12-16 NOTE — ED Notes (Signed)
Dialysis RN at bedside.

## 2021-12-16 NOTE — Assessment & Plan Note (Signed)
Status post right BKA and left AKA Continue Plavix and atorvastatin

## 2021-12-16 NOTE — ED Notes (Signed)
Pt awake and talking at this time. Confused.  Believes he already had dialysis today    Attempting to remove bipap

## 2021-12-16 NOTE — Assessment & Plan Note (Signed)
Patient presents for evaluation of mental status changes and had a venous blood gas which showed uncompensated respiratory acidosis. At baseline he usually wears 2 L of oxygen continuous Continue BiPAP initiated in the ER Repeat arterial blood gas 2 hours post BiPAP We will attempt to wean patient off BiPAP once acute illness improves or resolves

## 2021-12-16 NOTE — Assessment & Plan Note (Signed)
BMI 89.21 Complicates overall prognosis and care

## 2021-12-16 NOTE — ED Provider Notes (Signed)
[facility] Provider Note    Event Date/Time   First MD Initiated Contact with Patient 12/16/21 1213     (approximate)   History   Chief Complaint: Altered Mental Status and Chest Pain   HPI  Antonio Phillips is a 76 y.o. male with a history of ESRD, diabetes, obesity, atrial fibrillation who went to dialysis today for a routine session but was found to be acutely ill and sent to the ED instead without having his dialysis session today.  Patient is not able to provide any history.  He is not speaking.  He nods his head yes when asked questions, but this seems indiscriminate.  EMS reports that he had somehow related having chest pain over the past few days.  At baseline, patient is known to be verbal and interactive.     Physical Exam   Triage Vital Signs: ED Triage Vitals  Enc Vitals Group     BP 12/16/21 1215 93/71     Pulse Rate 12/16/21 1212 83     Resp 12/16/21 1212 13     Temp 12/16/21 1212 98.3 F (36.8 C)     Temp Source 12/16/21 1212 Oral     SpO2 12/16/21 1212 100 %     Weight 12/16/21 1238 212 lb (96.2 kg)     Height 12/16/21 1238 4\' 3"  (1.295 m)     Head Circumference --      Peak Flow --      Pain Score --      Pain Loc --      Pain Edu? --      Excl. in [place]? --     Most recent vital signs: Vitals:   12/16/21 1400 12/16/21 1541  BP: 93/63 104/71  Pulse: (!) 54 95  Resp: (!) 9 17  Temp:  98.3 F (36.8 C)  SpO2: 95% 93%    General: Awake, ill-appearing, confused.  CV:  Good peripheral perfusion.  Irregular rhythm Resp:  Normal effort.  Diffuse crackles Abd:  No distention.  Soft nontender Other:  Bilateral lower extremity edema   ED Results / Procedures / Treatments   Labs (all labs ordered are listed, but only abnormal results are displayed) Labs Reviewed  COMPREHENSIVE METABOLIC PANEL - Abnormal; Notable for the following components:      Result Value   Glucose, Bld 101 (*)    BUN 31 (*)    Creatinine, Ser  3.89 (*)    Total Protein 6.1 (*)    Albumin 3.4 (*)    Total Bilirubin 1.3 (*)    GFR, Estimated 15 (*)    All other components within normal limits  CBC WITH DIFFERENTIAL/PLATELET - Abnormal; Notable for the following components:   RBC 3.83 (*)    Hemoglobin 12.0 (*)    MCV 109.9 (*)    MCHC 28.5 (*)    RDW 20.6 (*)    Platelets 80 (*)    nRBC 0.5 (*)    All other components within normal limits  PROTIME-INR - Abnormal; Notable for the following components:   Prothrombin Time 16.7 (*)    INR 1.4 (*)    All other components within normal limits  AMMONIA - Abnormal; Notable for the following components:   Ammonia 36 (*)    All other components within normal limits  BLOOD GAS, VENOUS - Abnormal; Notable for the following components:   pH, Ven 7.16 (*)    pCO2, Ven 86 (*)  pO2, Ven 50 (*)    Bicarbonate 30.7 (*)    All other components within normal limits  ACETAMINOPHEN LEVEL - Abnormal; Notable for the following components:   Acetaminophen (Tylenol), Serum <10 (*)    All other components within normal limits  SALICYLATE LEVEL - Abnormal; Notable for the following components:   Salicylate Lvl <0.1 (*)    All other components within normal limits  TROPONIN I (HIGH SENSITIVITY) - Abnormal; Notable for the following components:   Troponin I (High Sensitivity) 66 (*)    All other components within normal limits  RESP PANEL BY RT-PCR (FLU A&B, COVID) ARPGX2  CULTURE, BLOOD (ROUTINE X 2)  CULTURE, BLOOD (ROUTINE X 2)  URINE CULTURE  LACTIC ACID, PLASMA  APTT  ETHANOL  PROCALCITONIN  LACTIC ACID, PLASMA  URINALYSIS, COMPLETE (UACMP) WITH MICROSCOPIC  URINE DRUG SCREEN, QUALITATIVE ([facility] ONLY)  HEPATITIS B SURFACE ANTIGEN  HEPATITIS B SURFACE ANTIBODY,QUALITATIVE  HEPATITIS B SURFACE ANTIBODY, QUANTITATIVE  HEPATITIS B CORE ANTIBODY, TOTAL  HEPATITIS C ANTIBODY  TSH  BLOOD GAS, ARTERIAL  TROPONIN I (HIGH SENSITIVITY)  TROPONIN I (HIGH SENSITIVITY)      EKG Interpreted by me Atrial fibrillation, rate of 112.  Right axis, left bundle branch block, no acute ischemic changes.   RADIOLOGY CT head interpreted by me, unremarkable.  Radiology report reviewed  Chest x-ray shows bilateral pleural effusions and pulmonary edema.       PROCEDURES:  .Critical Care  Performed by: Carrie Mew, MD Authorized by: Carrie Mew, MD   Critical care provider statement:    Critical care time (minutes):  35   Critical care time was exclusive of:  Separately billable procedures and treating other patients   Critical care was necessary to treat or prevent imminent or life-threatening deterioration of the following conditions:  Respiratory failure, CNS failure or compromise and renal failure   Critical care was time spent personally by me on the following activities:  Development of treatment plan with patient or surrogate, discussions with consultants, evaluation of patient's response to treatment, examination of patient, obtaining history from patient or surrogate, ordering and performing treatments and interventions, ordering and review of laboratory studies, ordering and review of radiographic studies, pulse oximetry, re-evaluation of patient's condition and review of old charts   Care discussed with: admitting provider      [place] ED: Medications  Chlorhexidine Gluconate Cloth 2 % PADS 6 each (has no administration in time range)  pentafluoroprop-tetrafluoroeth (GEBAUERS) aerosol 1 Application (has no administration in time range)  lidocaine (PF) (XYLOCAINE) 1 % injection 5 mL (has no administration in time range)  lidocaine-prilocaine (EMLA) cream 1 Application (has no administration in time range)  heparin injection 1,000 Units (has no administration in time range)  anticoagulant sodium citrate solution 5 mL (has no administration in time range)  alteplase (CATHFLO ACTIVASE) injection 2 mg (has no administration  in time range)  acetaminophen (TYLENOL) tablet 650 mg (has no administration in time range)  atorvastatin (LIPITOR) tablet 40 mg (has no administration in time range)  midodrine (PROAMATINE) tablet 10 mg (has no administration in time range)  sertraline (ZOLOFT) tablet 50 mg (has no administration in time range)  calcitRIOL (ROCALTROL) capsule 0.25 mcg (has no administration in time range)  calcium acetate (PHOSLO) capsule 1,334 mg (has no administration in time range)  lactulose (encephalopathy) (CHRONULAC) 10 GM/15ML solution 10 g (has no administration in time range)  sevelamer carbonate (RENVELA) tablet 1,600 mg (has no administration in time range)  clopidogrel (PLAVIX) tablet 75 mg (has no administration in time range)  folic acid (FOLVITE) tablet 1 mg (has no administration in time range)  cyanocobalamin (VITAMIN B12) tablet 1,000 mcg (has no administration in time range)  pregabalin (LYRICA) capsule 75 mg (has no administration in time range)  rOPINIRole (REQUIP) tablet 1 mg (has no administration in time range)  Carboxymethylcellulose Sod PF 0.5 % SOLN 1 drop (has no administration in time range)  Carboxymethylcellulose Sodium 1 % GEL 1 drop (has no administration in time range)  sodium chloride flush (NS) 0.9 % injection 3 mL (has no administration in time range)  sodium chloride flush (NS) 0.9 % injection 3 mL (has no administration in time range)  0.9 %  sodium chloride infusion (has no administration in time range)  ondansetron (ZOFRAN) tablet 4 mg (has no administration in time range)    Or  ondansetron (ZOFRAN) injection 4 mg (has no administration in time range)     IMPRESSION / MDM / ASSESSMENT AND PLAN / ED COURSE  I reviewed the triage vital signs and the nursing notes.                              Differential diagnosis includes, but is not limited to, CHF exacerbation, volume overload, hypercapnia, hepatic encephalopathy, electrolyte abnormality, intoxication,  intracranial hemorrhage  Patient's presentation is most consistent with acute presentation with potential threat to life or bodily function.  Patient presents with altered mental status, currently stuporous.  Oxygenation is okay, but lung exam is very abnormal and chest x-ray shows edema.  VBG shows acute respiratory acidosis with a pH of 7.1 and a PCO2 of 86.  BiPAP initiated.  On reassessment, mental status is slowly improving and patient is maintaining good tidal volumes on BiPAP.  Case discussed with Dr. Candiss Norse of nephrology who is arranging dialysis.  Discussed with hospitalist for further management.       FINAL CLINICAL IMPRESSION(S) / ED DIAGNOSES   Final diagnoses:  Acute respiratory failure with hypercapnia ([place])  CO2 narcosis  Acute encephalopathy  ESRD on hemodialysis ([place])     Rx / DC Orders   ED Discharge Orders     None        Note:  This document was prepared using Dragon voice recognition software and may include unintentional dictation errors.   Carrie Mew, MD 12/16/21 [phone]

## 2021-12-16 NOTE — Assessment & Plan Note (Signed)
Hold metoprolol due to bradycardia Not on long-term anticoagulation due to thrombocytopenia with increased risk for bleeding

## 2021-12-16 NOTE — ED Notes (Signed)
Pt obeys some commands but is still non verbal upon questioning.

## 2021-12-17 ENCOUNTER — Inpatient Hospital Stay (HOSPITAL_COMMUNITY)
Admit: 2021-12-17 | Discharge: 2021-12-17 | Disposition: A | Discharge status: Home / Self Care | Payer: No Typology Code available for payment source | Attending: Cardiovascular Disease | Admitting: Cardiovascular Disease

## 2021-12-17 ENCOUNTER — Encounter: Payer: Self-pay | Admitting: Internal Medicine

## 2021-12-17 DIAGNOSIS — J9602 Acute respiratory failure with hypercapnia: Principal | ICD-10-CM

## 2021-12-17 DIAGNOSIS — I4821 Permanent atrial fibrillation: Secondary | ICD-10-CM | POA: Diagnosis not present

## 2021-12-17 DIAGNOSIS — I5043 Acute on chronic combined systolic (congestive) and diastolic (congestive) heart failure: Secondary | ICD-10-CM

## 2021-12-17 DIAGNOSIS — Z515 Encounter for palliative care: Secondary | ICD-10-CM | POA: Diagnosis not present

## 2021-12-17 DIAGNOSIS — J9622 Acute and chronic respiratory failure with hypercapnia: Secondary | ICD-10-CM

## 2021-12-17 DIAGNOSIS — Z7189 Other specified counseling: Secondary | ICD-10-CM

## 2021-12-17 DIAGNOSIS — G9340 Encephalopathy, unspecified: Secondary | ICD-10-CM | POA: Diagnosis not present

## 2021-12-17 DIAGNOSIS — G9341 Metabolic encephalopathy: Secondary | ICD-10-CM | POA: Diagnosis not present

## 2021-12-17 DIAGNOSIS — R0689 Other abnormalities of breathing: Secondary | ICD-10-CM

## 2021-12-17 DIAGNOSIS — I25118 Atherosclerotic heart disease of native coronary artery with other forms of angina pectoris: Secondary | ICD-10-CM | POA: Diagnosis not present

## 2021-12-17 DIAGNOSIS — R079 Chest pain, unspecified: Secondary | ICD-10-CM | POA: Diagnosis not present

## 2021-12-17 DIAGNOSIS — N186 End stage renal disease: Secondary | ICD-10-CM | POA: Diagnosis not present

## 2021-12-17 DIAGNOSIS — Z992 Dependence on renal dialysis: Secondary | ICD-10-CM

## 2021-12-17 DIAGNOSIS — J9621 Acute and chronic respiratory failure with hypoxia: Secondary | ICD-10-CM

## 2021-12-17 LAB — ECHOCARDIOGRAM COMPLETE
AR max vel: 2.16 cm2
AV Area VTI: 2.39 cm2
AV Area mean vel: 2.27 cm2
AV Mean grad: 12.5 mmHg
AV Peak grad: 25.7 mmHg
Ao pk vel: 2.53 m/s
Area-P 1/2: 4.49 cm2
Calc EF: 51.6 %
Height: 51 in
MV M vel: 3.47 m/s
MV Peak grad: 48.2 mmHg
S' Lateral: 3.2 cm
Single Plane A2C EF: 48.2 %
Single Plane A4C EF: 55.2 %
Weight: 3392 oz

## 2021-12-17 LAB — BLOOD GAS, ARTERIAL
Acid-Base Excess: 2.9 mmol/L — ABNORMAL HIGH (ref 0.0–2.0)
Bicarbonate: 32.5 mmol/L — ABNORMAL HIGH (ref 20.0–28.0)
O2 Content: 4 L/min
O2 Saturation: 74.7 %
Patient temperature: 37
pCO2 arterial: 74 mmHg (ref 32–48)
pH, Arterial: 7.25 — ABNORMAL LOW (ref 7.35–7.45)
pO2, Arterial: 42 mmHg — ABNORMAL LOW (ref 83–108)

## 2021-12-17 LAB — BLOOD CULTURE ID PANEL (REFLEXED) - BCID2

## 2021-12-17 LAB — CBC
HCT: 36.3 % — ABNORMAL LOW (ref 39.0–52.0)
Hemoglobin: 10.4 g/dL — ABNORMAL LOW (ref 13.0–17.0)
MCH: 31.8 pg (ref 26.0–34.0)
MCHC: 28.7 g/dL — ABNORMAL LOW (ref 30.0–36.0)
MCV: 111 fL — ABNORMAL HIGH (ref 80.0–100.0)
Platelets: 63 10*3/uL — ABNORMAL LOW (ref 150–400)
RBC: 3.27 MIL/uL — ABNORMAL LOW (ref 4.22–5.81)
RDW: 20.5 % — ABNORMAL HIGH (ref 11.5–15.5)
WBC: 4.7 10*3/uL (ref 4.0–10.5)
nRBC: 0.4 % — ABNORMAL HIGH (ref 0.0–0.2)

## 2021-12-17 LAB — LACTIC ACID, PLASMA: Lactic Acid, Venous: 1.3 mmol/L (ref 0.5–1.9)

## 2021-12-17 LAB — BASIC METABOLIC PANEL
Anion gap: 12 (ref 5–15)
BUN: 19 mg/dL (ref 8–23)
CO2: 28 mmol/L (ref 22–32)
Calcium: 9 mg/dL (ref 8.9–10.3)
Chloride: 101 mmol/L (ref 98–111)
Creatinine, Ser: 2.8 mg/dL — ABNORMAL HIGH (ref 0.61–1.24)
GFR, Estimated: 23 mL/min — ABNORMAL LOW (ref >60)
Glucose, Bld: 72 mg/dL (ref 70–99)
Potassium: 3.6 mmol/L (ref 3.5–5.1)
Sodium: 141 mmol/L (ref 135–145)

## 2021-12-17 LAB — HEPATITIS C ANTIBODY: HCV Ab: NONREACTIVE

## 2021-12-17 LAB — HEPATITIS B SURFACE ANTIBODY,QUALITATIVE: Hep B S Ab: NONREACTIVE

## 2021-12-17 LAB — CBG MONITORING, ED: Glucose-Capillary: 80 mg/dL (ref 70–99)

## 2021-12-17 LAB — TSH: TSH: 6.049 u[IU]/mL — ABNORMAL HIGH (ref 0.350–4.500)

## 2021-12-17 LAB — HEPATITIS B CORE ANTIBODY, TOTAL: Hep B Core Total Ab: NONREACTIVE

## 2021-12-17 LAB — HEPATITIS B SURFACE ANTIGEN: Hepatitis B Surface Ag: NONREACTIVE

## 2021-12-17 LAB — TROPONIN I (HIGH SENSITIVITY): Troponin I (High Sensitivity): 60 ng/L — ABNORMAL HIGH (ref <18)

## 2021-12-17 MED ORDER — VANCOMYCIN HCL IN DEXTROSE 1-5 GM/200ML-% IV SOLN
1000.0000 mg | INTRAVENOUS | Status: DC
  Administered 2021-12-18 – 2021-12-21 (×2): 1000 mg via INTRAVENOUS
  Filled 2021-12-17 (×3): qty 200

## 2021-12-17 MED ORDER — GERHARDT'S BUTT CREAM
TOPICAL_CREAM | CUTANEOUS | Status: DC
  Administered 2021-12-18: 1 via TOPICAL
  Filled 2021-12-17 (×2): qty 1

## 2021-12-17 MED ORDER — VANCOMYCIN HCL 2000 MG/400ML IV SOLN
2000.0000 mg | Freq: Once | INTRAVENOUS | Status: AC
  Administered 2021-12-17: 2000 mg via INTRAVENOUS
  Filled 2021-12-17: qty 400

## 2021-12-17 MED ORDER — MIDODRINE HCL 5 MG PO TABS
10.0000 mg | ORAL_TABLET | Freq: Once | ORAL | Status: DC
  Filled 2021-12-17: qty 2

## 2021-12-17 MED ORDER — MIDODRINE HCL 5 MG PO TABS
20.0000 mg | ORAL_TABLET | Freq: Three times a day (TID) | ORAL | Status: DC
  Administered 2021-12-18 – 2021-12-22 (×11): 20 mg via ORAL
  Filled 2021-12-17 (×12): qty 4

## 2021-12-17 MED ORDER — MIDODRINE HCL 5 MG PO TABS
10.0000 mg | ORAL_TABLET | Freq: Once | ORAL | Status: AC
  Administered 2021-12-17: 10 mg via ORAL
  Filled 2021-12-17: qty 2

## 2021-12-17 MED ORDER — ALBUMIN HUMAN 25 % IV SOLN
25.0000 g | Freq: Once | INTRAVENOUS | Status: AC
  Administered 2021-12-18: 25 g via INTRAVENOUS
  Filled 2021-12-17: qty 100

## 2021-12-17 MED ORDER — SODIUM CHLORIDE 0.9 % IV BOLUS
250.0000 mL | Freq: Once | INTRAVENOUS | Status: AC
  Administered 2021-12-17: 250 mL via INTRAVENOUS

## 2021-12-17 NOTE — ED Notes (Signed)
Pt repositioned onto right side, per request. Pt fed themselves, tray empty, Tray thrown away

## 2021-12-17 NOTE — ED Notes (Signed)
This RN into room for patient med admin, RN assisted pt in sitting up. Pt appears drowsy but arrousable with stimulation. Md notified of patient drowsiness. Advised to admin ordered meds, and continue to monitor pt assessment.

## 2021-12-17 NOTE — Progress Notes (Signed)
On arrival to room pt was pulling bipap off and stated he didn't want it on at this time, pt placed on [place] at 3L

## 2021-12-17 NOTE — Progress Notes (Signed)
Pharmacy Antibiotic Note  Antonio Phillips is a 76 y.o. male admitted on 12/16/2021 with bacteremia. Pharmacy has been consulted for vancomycin dosing.  Blood cultures growing Staph epi in 1/4 bottles. Starting vancomycin due to hypotension/declining clinical status on 10/13. Next HD currently planned for Saturday.  Plan: Give vancomycin 2000 mg x 1 loading dose Vancomycin 1,000 mg QHD -TTSat (post HD doses)   Follow nephrology plans and clinical course.  Height: 4\' 3"  (129.5 cm) Weight:  (unable to weigh, pt in stretcher, Bedside HD in ED) IBW/kg (Calculated) : 29.3  Temp (24hrs), Avg:97.7 F (36.5 C), Min:97.4 F (36.3 C), Max:98 F (36.7 C)  Recent Labs  Lab 12/16/21 1224 12/17/21 0501  WBC 6.7 4.7  CREATININE 3.89* 2.80*  LATICACIDVEN 1.4 1.3    Estimated Creatinine Clearance: 17.5 mL/min (A) (by C-G formula based on SCr of 2.8 mg/dL (H)).    Allergies  Allergen Reactions   Simvastatin Other (See Comments)    Antimicrobials this admission: vancomycin 10/13 >>    Dose adjustments this admission: N/a  Microbiology results: 10/12 BCx: GPC; Staph epi 1/4  Thank you for allowing pharmacy to be a part of this patient's care.  Wynelle Cleveland 12/17/2021 5:33 PM

## 2021-12-17 NOTE — ED Notes (Signed)
The pt's blood pressure has been trending downward since this RN took over the pt's care @2300 . Earney Hamburg, NP., has been made aware. Awaiting new orders, close monitoring continued.

## 2021-12-17 NOTE — Progress Notes (Signed)
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 Patient was found to be in uncompensated respiratory stenosis with hypercapnia and was placed on BiPAP. Nephrology was consulted for hemodialysis   Subjective   Patient's mental status has improved.  He is answering questions appropriately.  Became hypotensive this morning, found to be in A-fib with heart rate controlled.   Assessment/Plan:    Acute metabolic encephalopathy -Secondary to uncompensated respiratory acidosis with hypercapnia -Mental status is improving  Acute on chronic respiratory failure with hypoxia and hypercapnia -Presented with mental status changes, found to have uncompensated respiratory acidosis on ABG -At baseline he wears 2 L of oxygen continuously -Started on BiPAP in the ED -BiPAP has been turned off now as patient could not tolerate it, placed back on oxygen via nasal cannula 3 L/min  Sinus bradycardia -Likely in setting of obstructive sleep apnea/obesity and also metoprolol use -Metoprolol is currently on hold -TSH 6.049  Bacteremia -Blood cultures growing Staph epidermidis could be contamination, however patient is hypotensive -We will start vancomycin per pharmacy consultation -We will await final culture results.  Hypotension -Systolic blood pressure has been hanging around in 80s -Patient has been on midodrine 10 mg 3 times daily -We will change midodrine to 20 mg p.o. 3  times daily -We will give normal saline bolus 250 cc x 1, nephrology is okay with one-time bolus of normal saline  Acute on chronic systolic CHF -Chest x-ray showed bilateral pleural effusions with pulmonary vascular congestion -LVEF 40 to 45% as per echo from December 2020 from -Continue renal replacement therapy  ESRD -Patient has history of ESRD and is on hemodialysis Tuesday Thursday and Saturday -He has history of noncompliance with dialysis -Nephrology consulted  Pressure injury of the skin -Patient has unstageable pressure ulcers over the gluteal region -Wound care consulted  Thrombocytopenia -Chronic  Mild elevation of troponin -Troponins mildly elevated, 66, 60 -Likely due to demand ischemia in the setting of acute on chronic systolic CHF -Echocardiogram ordered; will follow results -Continue Plavix, atorvastatin  Permanent atrial fibrillation -Metoprolol on hold due to bradycardia and hypotension -Not on long-term anticoagulation due to thrombocytopenia with increased risk of bleeding -Heart rate fairly well controlled, less than 100  PVD -S/p right BKA and left AKA -Continue Plavix and atorvastatin   Medications     artificial tears   Both Eyes BID   atorvastatin  40 mg Oral QHS   calcitRIOL  0.25 mcg Oral Daily   calcium acetate  1,334 mg Oral TID   Chlorhexidine Gluconate Cloth  6 each Topical Q0600   clopidogrel  75 mg Oral Q breakfast   cyanocobalamin  1,000 mcg Oral Daily   folic acid  1 mg Oral Daily   midodrine  10 mg Oral TID WC   midodrine  10 mg Oral Once   pregabalin  75 mg Oral QHS   rOPINIRole  1 mg Oral QHS   sertraline  50 mg Oral Daily   sevelamer carbonate  1,600 mg Oral  3 times per day on Sun Mon Wed Fri   sodium chloride flush  3 mL Intravenous Q12H     Data Reviewed:   CBG:  Recent Labs  Lab 12/17/21 0113  GLUCAP 80    SpO2: 100 % O2 Flow Rate (L/min): 3 L/min    Vitals:   12/17/21 0355 12/17/21 0624 12/17/21 0700  12/17/21 0715  BP:  (!) 86/62 (!) 101/59 100/66  Pulse: 85 89  (!) 49  Resp:  (!) 22 12 (!) 8  Temp:  97.8 F (36.6 C)    TempSrc:  Axillary    SpO2: 96% 98%  100%  Weight:      Height:          Data Reviewed:  Basic Metabolic Panel: Recent Labs  Lab 12/16/21 1224 12/17/21 0501  NA 141 141  K 3.7 3.6  CL 103 101  CO2 28 28  GLUCOSE 101* 72  BUN 31* 19  CREATININE 3.89* 2.80*  CALCIUM 9.5 9.0    CBC: Recent Labs  Lab 12/16/21 1224 12/17/21 0501  WBC 6.7 4.7  NEUTROABS 3.9  --   HGB 12.0* 10.4*  HCT 42.1 36.3*  MCV 109.9* 111.0*  PLT 80* 63*    LFT Recent Labs  Lab 12/16/21 1224  AST 16  ALT 7  ALKPHOS 70  BILITOT 1.3*  PROT 6.1*  ALBUMIN 3.4*     Antibiotics: Anti-infectives (From admission, onward)    None        DVT prophylaxis: None, patient has thrombocytopenia, and bilateral lower extremity amputation  Code Status: DNR  Family Communication: No family at bedside   CONSULTS nephrology   Objective    Physical Examination:   General-appears in no acute distress Heart-S1-S2, regular, no murmur auscultated Lungs-clear to auscultation bilaterally, no wheezing or crackles auscultated Abdomen-soft, nontender, no organomegaly Extremities-no edema in the lower extremities Neuro-alert, oriented x3, no focal deficit noted   Status is: Inpatient:             Oswald Hillock   [facility] If 7PM-7AM, please contact night-coverage at [email or web address], Office  [phone]   12/17/2021, 8:26 AM  LOS: 1 day

## 2021-12-17 NOTE — ED Notes (Addendum)
PT small BM cleaned up. New brief placed. Pt has large area of reddened skin and break down on buttock. Pt has small stage 2 wounds around coccyx. Foam dressing placed on patient. Pt repositioned onto L  side with pillows  Pt fed one container of applesauce with afternoon medicaitons

## 2021-12-17 NOTE — ED Notes (Signed)
Pt titrated to 5L [place] for o2 sat 88% on 3L

## 2021-12-17 NOTE — Progress Notes (Signed)
PHARMACY - PHYSICIAN COMMUNICATION CRITICAL VALUE ALERT - BLOOD CULTURE IDENTIFICATION (BCID)  Antonio Phillips is an 76 y.o. male who presented to [facility] on 12/16/2021 with a chief complaint of  metabolic encephalopathy  Assessment:  Staph epi in 1 of 4 bottles, Mec A detected, pt is afebrile , WBC normal, most likely contaminant (include suspected source if known)  Name of physician (or Provider) Contacted: Sharion Settler, NP   Current antibiotics: none   Changes to prescribed antibiotics recommended:  No abx coverage needed  Results for orders placed or performed during the hospital encounter of 01/31/21  Blood Culture ID Panel (Reflexed) (Collected: 02/02/2021  6:07 PM)  Result Value Ref Range   Enterococcus faecalis NOT DETECTED NOT DETECTED   Enterococcus Faecium NOT DETECTED NOT DETECTED   Listeria monocytogenes NOT DETECTED NOT DETECTED   Staphylococcus species NOT DETECTED NOT DETECTED   Staphylococcus aureus (BCID) NOT DETECTED NOT DETECTED   Staphylococcus epidermidis NOT DETECTED NOT DETECTED   Staphylococcus lugdunensis NOT DETECTED NOT DETECTED   Streptococcus species NOT DETECTED NOT DETECTED   Streptococcus agalactiae NOT DETECTED NOT DETECTED   Streptococcus pneumoniae NOT DETECTED NOT DETECTED   Streptococcus pyogenes NOT DETECTED NOT DETECTED   A.calcoaceticus-baumannii NOT DETECTED NOT DETECTED   Bacteroides fragilis NOT DETECTED NOT DETECTED   Enterobacterales DETECTED (A) NOT DETECTED   Enterobacter cloacae complex NOT DETECTED NOT DETECTED   Escherichia coli DETECTED (A) NOT DETECTED   Klebsiella aerogenes NOT DETECTED NOT DETECTED   Klebsiella oxytoca NOT DETECTED NOT DETECTED   Klebsiella pneumoniae NOT DETECTED NOT DETECTED   Proteus species NOT DETECTED NOT DETECTED   Salmonella species NOT DETECTED NOT DETECTED   Serratia marcescens NOT DETECTED NOT DETECTED   Haemophilus influenzae NOT DETECTED NOT DETECTED   Neisseria meningitidis NOT  DETECTED NOT DETECTED   Pseudomonas aeruginosa NOT DETECTED NOT DETECTED   Stenotrophomonas maltophilia NOT DETECTED NOT DETECTED   Candida albicans NOT DETECTED NOT DETECTED   Candida auris NOT DETECTED NOT DETECTED   Candida glabrata NOT DETECTED NOT DETECTED   Candida krusei NOT DETECTED NOT DETECTED   Candida parapsilosis NOT DETECTED NOT DETECTED   Candida tropicalis NOT DETECTED NOT DETECTED   Cryptococcus neoformans/gattii NOT DETECTED NOT DETECTED   CTX-M ESBL NOT DETECTED NOT DETECTED   Carbapenem resistance IMP NOT DETECTED NOT DETECTED   Carbapenem resistance KPC NOT DETECTED NOT DETECTED   Carbapenem resistance NDM NOT DETECTED NOT DETECTED   Carbapenem resist OXA 48 LIKE NOT DETECTED NOT DETECTED   Carbapenem resistance VIM NOT DETECTED NOT DETECTED    Jailyn Langhorst D 12/17/2021  6:31 AM

## 2021-12-17 NOTE — Progress Notes (Signed)
  Echocardiogram 2D Echocardiogram has been performed.  Antonio Phillips 12/17/2021, 2:55 PM

## 2021-12-17 NOTE — Social Work (Signed)
CSW advised by Aniceto Boss with Palliative that pt is in need of a surrogate decision maker to assist with making decisions when he is unable. Pt notes he does not have anyone to ask. CSW spoke with Admissions at [facility] to discuss disposition. Pt has been there for several years and there is no barrier to him returning. Pt will not need an authorization. Pt receives HD at [facility] on [facility]. CSW advised for facility to notify their social worker to assist the pt in this process when he returns. TOC will continue to follow.

## 2021-12-17 NOTE — Consult Note (Signed)
Cardiology Consult    Patient ID: Antonio Phillips MRN: [id number], DOB/AGE: [birth year]   Admit date: 12/16/2021 Date of Consult: 12/17/2021  Primary Physician: Marsh Dolly, MD Primary Cardiologist: Ida Rogue, MD Requesting Provider: Georgiann Mohs, MD  Patient Profile    Antonio Phillips is a 76 y.o. 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 Darrick Meigs.  Past Medical History   Past Medical History:  Diagnosis Date   Chronic HFmrEF (heart failure with midrange ejection fraction) ([place])    a. 02/2021 Echo:  EF 40-45%, nl RV fxn, midlly dil LA, mild MR.   Coronary artery disease    a. 09/2020 Cath/PCI: LM nl, LAD 50ost/p, LCX min irregs, OM2 99 (2.5x15 Resolute Onyx DES), RCA mild diff dzs, RPDA nl.   Demand ischemia    a. 11/2017 elev Trop in setting of AFib/SVT-->Echo [facility]): EF>55%. Mild LVH. Nl RV fxn.   Diabetes ([place])    a. Pt states that he has no hx of diabetes--A1c 5.5 11/2018.   ESRD (end stage renal disease) ([place])    a. On HD since 2019.   Hyperlipidemia    Hypotension    a. On midodrine.   Ischemic cardiomyopathy    a. 02/2021 Echo:  EF 40-45%.   Morbid obesity ([place])    PAD (peripheral artery disease) ([place])    a. s/p L AKA and R BKA.   Permanent atrial fibrillation ([place])    a. Dx 2019. CHA2DS2VASc = 3-4-->Eliquis.    Past Surgical History:  Procedure Laterality Date   AV FISTULA REPAIR     BELOW KNEE LEG AMPUTATION Bilateral    CHOLECYSTECTOMY     ERCP N/A 02/04/2021   Procedure: ENDOSCOPIC RETROGRADE  CHOLANGIOPANCREATOGRAPHY (ERCP);  Surgeon: Lucilla Lame, MD;  Location[facility] ENDOSCOPY;  Service: Endoscopy;  Laterality: N/A;   LEFT HEART CATH AND CORONARY ANGIOGRAPHY N/A 09/22/2020   Procedure: LEFT HEART CATH AND CORONARY ANGIOGRAPHY;  Surgeon: Sherren Mocha, MD;  Location: [facility];  Service: Cardiovascular;  Laterality: N/A;   TOTAL HIP ARTHROPLASTY Bilateral      Allergies  Allergies  Allergen Reactions   Simvastatin Other (See Comments)    History of Present Illness    76 year old male with the above past medical history including 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  He has been on hemodialysis since 2019.  In September 2019, he was admitted to [facility] with chest pain and rapid A-fib/SVT.  Echo showed an EF greater than 55%.  He was seen by cardiology in the setting of elevated troponin with recommendation for beta-blocker, oral anticoagulation, and medical therapy.  In July 2022, he was admitted with chest pain and non-STEMI.  Diagnostic catheterization revealed severe 99% stenosis in the second obtuse marginal otherwise moderate, nonobstructive CAD.  The obtuse marginal was successfully treated with a drug-eluting stent.  He was readmitted in  November 2022 in the setting of gallstone pancreatitis and common bile duct stone.  In the setting of acute illness, he had elevated A-fib rates and also hypotension.  He required ERCP and stenting of the common bile duct with subsequent development of gram-negative rod bacteremia and encephalopathy requiring readmission in mid December 2022.  He stays at a local nursing facility and has not followed up with cardiology as an outpatient.  He was  most recently admitted to [facility] from September 30 to October 3 secondary to acute encephalopathy felt to be due to missing dialysis.  Patient is unclear as to how he ended up at [facility] on October 12 however, notes indicate that he presented to the emergency department from hemodialysis secondary to altered mental status and complaints of chest pain.  He was minimally verbal per H&P.  ECG showed atrial fibrillation at 112 bpm with prior anterior infarct.  No acute changes.  Notes indicate that he was bradycardic on cardiac monitor however review of telemetry shows stable heart rates with small complexes and inaccurate recording of heart rates.  Head CT showed no acute abnormality.  Chest x-ray showed cardiomegaly with increased pulm vascular congestion, enlarging moderate right and possible small left pleural effusions, and bibasilar atelectasis or consolidation.  Pressures were soft in the 90s.  Venous blood gas notable for uncompensated respiratory acidosis with hypercapnia.  Troponin mildly elevated at 66  60.  Patient currently denies chest pain.  As noted above, he is unaware of how he ended up at [facility] and initially did not realize that he was in the hospital.  He says he thinks he might of had about 90 minutes worth of chest pain while eating breakfast yesterday morning but currently denies any chest pain.  Inpatient Medications     artificial tears   Both Eyes BID   atorvastatin  40 mg Oral QHS   calcitRIOL  0.25 mcg Oral Daily   calcium acetate  1,334 mg Oral TID   Chlorhexidine Gluconate Cloth  6 each Topical Q0600   clopidogrel  75 mg Oral Q breakfast   cyanocobalamin  1,000 mcg Oral Daily   folic acid  1 mg Oral Daily   Gerhardt's butt cream   Topical Q4H   midodrine  10 mg Oral TID WC   midodrine  10 mg Oral Once   pregabalin  75 mg Oral QHS   rOPINIRole  1 mg Oral QHS   sertraline  50 mg Oral Daily   sevelamer carbonate  1,600 mg Oral 3 times per  day on Sun Mon Wed Fri   sodium chloride flush  3 mL Intravenous Q12H    Family History    No family history on file. He indicated that his mother is deceased. He indicated that his father is deceased.   Social History    Social History   Socioeconomic History   Marital status: Single    Spouse name: Not on file   Number of children: Not on file   Years of education: Not on file   Highest education level: Not on file  Occupational History   Not on file  Tobacco Use   Smoking status: Never   Smokeless tobacco: Never  Vaping Use   Vaping Use: Never used  Substance and Sexual Activity   Alcohol use: Never   Drug use: Never   Sexual activity: Not on file  Other Topics Concern   Not on file  Social History Narrative  Lives @ local nsg facility.  Does not routinely exercise.   Social Determinants of Health   Financial Resource Strain: Not on file  Food Insecurity: No Food Insecurity (12/05/2021)   Hunger Vital Sign    Worried About Running Out of Food in the Last Year: Never true    Ran Out of Food in the Last Year: Never true  Transportation Needs: No Transportation Needs (12/05/2021)   PRAPARE - Hydrologist (Medical): No    Lack of Transportation (Non-Medical): No  Physical Activity: Not on file  Stress: Not on file  Social Connections: Not on file  Intimate Partner Violence: Not At Risk (12/05/2021)   Humiliation, Afraid, Rape, and Kick questionnaire    Fear of Current or Ex-Partner: No    Emotionally Abused: No    Physically Abused: No    Sexually Abused: No     Review of Systems    General:  No chills, fever, night sweats or weight changes.  Cardiovascular:  +++ chest pain, +++ dyspnea on exertion, +++ edema, no orthopnea, palpitations, paroxysmal nocturnal dyspnea. Dermatological: No rash, lesions/masses Respiratory: No cough, +++ chronic dyspnea Urologic: No hematuria, dysuria Abdominal:   No nausea, vomiting, diarrhea,  bright red blood per rectum, melena, or hematemesis Neurologic:  No visual changes, wkns, +++ changes in mental status. All other systems reviewed and are otherwise negative except as noted above.  Physical Exam    Blood pressure (!) 82/54, pulse 85, temperature 97.7 F (36.5 C), temperature source Oral, resp. rate 16, height 4\' 3"  (1.295 m), weight 96.2 kg, SpO2 92 %.  General: Pleasant, NAD Psych: Flat affect. Neuro: Disoriented to time and place.  Moves all extremities spontaneously. HEENT: Normal  Neck: Supple without bruits.  Difficult to gauge JVP secondary to body habitus. Lungs:  Resp regular and unlabored, bibasilar crackles and otherwise diminished breath sounds. Heart: Irregularly irregular, no s3, s4, or murmurs. Abdomen: Obese, soft, non-tender, non-distended, BS + x 4.  Extremities: No clubbing, cyanosis.  1+ right forearm/hand edema.  Left AKA, right BKA.  Labs    Cardiac Enzymes Recent Labs  Lab 12/16/21 1224 12/17/21 0501  TROPONINIHS 66* 60*     BNP    Component Value Date/Time   BNP 1,932.3 (H) 12/04/2021 1054   Lab Results  Component Value Date   WBC 4.7 12/17/2021   HGB 10.4 (L) 12/17/2021   HCT 36.3 (L) 12/17/2021   MCV 111.0 (H) 12/17/2021   PLT 63 (L) 12/17/2021    Recent Labs  Lab 12/16/21 1224 12/17/21 0501  NA 141 141  K 3.7 3.6  CL 103 101  CO2 28 28  BUN 31* 19  CREATININE 3.89* 2.80*  CALCIUM 9.5 9.0  PROT 6.1*  --   BILITOT 1.3*  --   ALKPHOS 70  --   ALT 7  --   AST 16  --   GLUCOSE 101* 72   Lab Results  Component Value Date   CHOL 58 09/23/2020   HDL 23 (L) 09/23/2020   LDLCALC 22 09/23/2020   TRIG 63 09/23/2020   Lab Results  Component Value Date   DDIMER 0.60 (H) 09/20/2020    Radiology Studies    CT Head Wo Contrast  Result Date: 12/16/2021 CLINICAL DATA:  Altered mental status EXAM: CT HEAD WITHOUT CONTRAST TECHNIQUE: Contiguous axial images were obtained from the base of the skull through the vertex  without intravenous contrast. RADIATION DOSE REDUCTION: This exam was performed according  to the departmental dose-optimization program which includes automated exposure control, adjustment of the mA and/or kV according to patient size and/or use of iterative reconstruction technique. COMPARISON:  CT T bone 12/04/21 FINDINGS: Brain: No evidence of acute infarction, hemorrhage, hydrocephalus, extra-axial collection or mass lesion/mass effect. There is mineralization of the basal ganglia bilaterally. Advanced generalized volume loss. Vascular: No hyperdense vessel or unexpected calcification. Skull: Normal. Negative for fracture or focal lesion. Sinuses/Orbits: Bilateral lens replacements. Large bilateral mastoid effusions with thinning of the tegmen tympani on the left. There is fluid in the middle ear cavity bilaterally. Other: None. IMPRESSION: 1. No acute intracranial abnormality. 2. Large bilateral mastoid effusions with thinning of the tegmen tympani and likely erosion through the roof of the mastoid air cells on the left. Consider further evaluation with a contrast enhanced temporal bone CT if the patient has symptoms of otomastoiditis. Electronically Signed   By: Marin Roberts M.D.   On: 12/16/2021 13:33   DG Chest Port 1 View  Result Date: 12/16/2021 CLINICAL DATA:  Questionable sepsis - evaluate for abnormality. EXAM: PORTABLE CHEST 1 VIEW COMPARISON:  Chest radiograph 12/06/2021 FINDINGS: The cardiac silhouette remains enlarged. There is increased pulmonary vascular congestion. There is a moderate right pleural effusion which has increased in size. There may be a small left pleural effusion. Increased airspace opacities are present in the left greater than right lung bases. No pneumothorax is identified. No acute osseous abnormality is seen. IMPRESSION: Cardiomegaly with increased pulmonary vascular congestion, enlarging moderate right and possible small left pleural effusions, and bibasilar atelectasis  or consolidation. Electronically Signed   By: Logan Bores M.D.   On: 12/16/2021 13:10   DG Chest Port 1 View  Result Date: 12/06/2021 CLINICAL DATA:  Status post right thoracentesis. EXAM: PORTABLE CHEST 1 VIEW COMPARISON:  December 04, 2021. FINDINGS: No pneumothorax status post right-sided thoracentesis. Right pleural effusion is smaller. IMPRESSION: No pneumothorax status post right-sided thoracentesis. Electronically Signed   By: Marijo Conception M.D.   On: 12/06/2021 15:45   US THORACENTESIS ASP PLEURAL SPACE W/IMG GUIDE  Result Date: 12/06/2021 INDICATION: Patient with history of end-stage renal disease, chronic hypoxic respiratory failure, AFib found to be hypotensive with pleural effusion. Request is for thoracentesis EXAM: ULTRASOUND GUIDED therapeutic and diagnostic right-sided THORACENTESIS MEDICATIONS: Lidocaine 1% 10 mL COMPLICATIONS: None immediate. PROCEDURE: An ultrasound guided thoracentesis was thoroughly discussed with the patient and questions answered. The benefits, risks, alternatives and complications were also discussed. The patient understands and wishes to proceed with the procedure. Written consent was obtained. Ultrasound was performed to localize and mark an adequate pocket of fluid in the right chest. The area was then prepped and draped in the normal sterile fashion. 1% Lidocaine was used for local anesthesia. Under ultrasound guidance a 6 Fr Safe-T-Centesis catheter was introduced. Thoracentesis was performed. The catheter was removed and a dressing applied. FINDINGS: A total of approximately 700 mL of straw-colored fluid was removed. Samples were sent to the laboratory as requested by the clinical team. Procedure terminated due to patient's inability to tolerate additional fluid removal IMPRESSION: Successful ultrasound guided therapeutic and diagnostic right-sided thoracentesis yielding 700 mL of pleural fluid. Read by: Rushie Nyhan, NP Electronically Signed   By:  Albin Felling M.D.   On: 12/06/2021 15:44   US RENAL  Result Date: 12/04/2021 CLINICAL DATA:  Hematuria. EXAM: RENAL / URINARY TRACT ULTRASOUND COMPLETE COMPARISON:  Same day CT abdomen pelvis at 1439 hours FINDINGS: Right Kidney: Renal measurements: 9.8 x 5.7  x 5.1 cm = volume: 151 mL. Diffuse cortical thinning and increased echogenicity. No hydronephrosis visualized. A 2.6 cm cortical cyst is noted at the lower pole of the right kidney Left Kidney: Renal measurements: 11.4 x 6.4 x 6.6 cm = volume: 151 mL. Diffuse cortical thinning and increased echogenicity. No hydronephrosis visualized. A 3.5 cm cortical cyst is noted at the middle third of the left kidney. Bladder: Appears normal for degree of bladder distention. Other: Free fluid is noted in the right upper quadrant. Right pleural effusion is noted. Cholelithiasis. Gallbladder wall measures 0.4 cm in thickness. Multiple echogenic foci within the spleen correspond to vascular calcifications seen on same day CT abdomen pelvis. IMPRESSION: 1. Atrophic kidneys, consistent with chronic medical renal disease. 2. Single bilateral benign renal cortical cysts are noted. The other multiple bilateral exophytic renal lesions described on same day CT abdomen pelvis are not visualized as the exam is technically challenging secondary to patient body habitus and immobility. If further evaluation is clinically desired, abdominal MRI could be performed when the patient is clinically stable and able to follow directions and hold their breath (preferably as an outpatient). 3. Right upper quadrant ascites and right pleural effusion, corresponding to findings on same day CT abdomen pelvis. 4. Cholelithiasis. Gallbladder wall thickening is favored to represent sequela of fluid third-spacing as the gallbladder is minimally distended. Electronically Signed   By: Ileana Roup M.D.   On: 12/04/2021 21:02   CT ABDOMEN PELVIS WO CONTRAST  Result Date: 12/04/2021 CLINICAL DATA:   Lower abdominal, buttock, and hip pain EXAM: CT ABDOMEN AND PELVIS WITHOUT CONTRAST TECHNIQUE: Multidetector CT imaging of the abdomen and pelvis was performed following the standard protocol without IV contrast. RADIATION DOSE REDUCTION: This exam was performed according to the departmental dose-optimization program which includes automated exposure control, adjustment of the mA and/or kV according to patient size and/or use of iterative reconstruction technique. COMPARISON:  CT chest abdomen pelvis, 02/20/2021 FINDINGS: Lower chest: Cardiomegaly. Three-vessel coronary artery calcifications. Aortic valve calcifications. Hepatobiliary: No solid liver abnormality is seen. Gallstones contracted in the gallbladder. No gallbladder wall thickening, or biliary dilatation. Pancreas: Unremarkable. No pancreatic ductal dilatation or surrounding inflammatory changes. Spleen: Normal in size without significant abnormality. Adrenals/Urinary Tract: Adrenal glands are unremarkable. Atrophic kidneys. No calculi or hydronephrosis. Multiple bilateral exophytic renal lesions of varying attenuation, several of intermediate soft tissue attenuation and not clearly simple cysts, including of the superior pole of the left kidney (series 2, image 40), the lateral midportion of the left kidney (series 2, image 46), in the inferior pole of the right kidney (series 2, image 58). These are generally unchanged compared to prior examination however incompletely characterized. Dense metallic streak artifact from bilateral hip total arthroplasty limits evaluation of the low pelvis. No obvious abnormality of the bladder. Stomach/Bowel: Stomach is within normal limits. Appendix appears normal. No evidence of bowel wall thickening, distention, or inflammatory changes. Descending and sigmoid diverticulosis. Vascular/Lymphatic: No significant vascular findings are present. No enlarged abdominal or pelvic lymph nodes. Reproductive: Dense metallic streak  artifact from bilateral hip total arthroplasty limits evaluation of the low pelvis. No obvious abnormality. Other: No abdominal wall hernia or abnormality. No ascites. Musculoskeletal: Status post bilateral hip total arthroplasty. Disc degenerative disease and bridging osteophytosis throughout the lower thoracic and lumbar spine, in keeping with DISH. IMPRESSION: 1. Moderate volume ascites. 2. Large right, small left pleural effusions.  Anasarca. 3. Descending and sigmoid diverticulosis without evidence of acute diverticulitis. 4. Cholelithiasis without evidence of acute  cholecystitis. 5. Status post bilateral hip total arthroplasty without obvious acute abnormality to explain hip pain. 6. Cardiomegaly and coronary artery disease. 7. Multiple bilateral exophytic renal lesions of varying attenuation, several of intermediate soft tissue attenuation and not clearly simple cysts. These are generally unchanged compared to prior examination and most likely hemorrhagic or proteinaceous cysts, however solid mass is not excluded. Initial renal ultrasound can be performed on a nonemergent, outpatient basis to ensure benign cystic character. Aortic Atherosclerosis (ICD10-I70.0). Electronically Signed   By: Delanna Ahmadi M.D.   On: 12/04/2021 15:11   CT HEAD WO CONTRAST (5MM)  Result Date: 12/04/2021 CLINICAL DATA:  Provided history: Mental status change, unknown cause. EXAM: CT HEAD WITHOUT CONTRAST TECHNIQUE: Contiguous axial images were obtained from the base of the skull through the vertex without intravenous contrast. RADIATION DOSE REDUCTION: This exam was performed according to the departmental dose-optimization program which includes automated exposure control, adjustment of the mA and/or kV according to patient size and/or use of iterative reconstruction technique. COMPARISON:  CT 02/20/2021. FINDINGS: Brain: Frontal lobe predominant cerebral atrophy. Mild patchy and ill-defined hypoattenuation within the cerebral  white matter, nonspecific but compatible with chronic small vessel ischemic disease. Mineralization within the bilateral basal ganglia. There is no acute intracranial hemorrhage. No demarcated cortical infarct. No extra-axial fluid collection. No evidence of an intracranial mass. No midline shift. Vascular: No hyperdense vessel.  Atherosclerotic calcifications. Skull: No fracture or aggressive osseous lesion. Sinuses/Orbits: Mild mucosal thickening within the bilateral frontal and ethmoid sinuses. Fluid level, and background mucosal thickening, within the right sphenoid sinus. Trace mucosal thickening within the right maxillary sinus at the imaged levels. Other: Bilateral mastoid effusions. IMPRESSION: No evidence of acute intracranial abnormality. Mild chronic small vessel ischemic changes within the cerebral white matter. Frontal lobe predominant cerebral atrophy. Paranasal sinus disease at the imaged levels, as described. Bilateral mastoid effusions. Electronically Signed   By: Kellie Simmering D.O.   On: 12/04/2021 11:32   DG Chest Portable 1 View  Result Date: 12/04/2021 CLINICAL DATA:  Altered mental status, weakness EXAM: PORTABLE CHEST 1 VIEW COMPARISON:  Previous studies including the examination of 06/05/2021 FINDINGS: Transverse diameter of heart is increased. There is interval appearance of moderate right pleural effusion. Possibility of underlying infiltrates is not excluded. Central pulmonary vessels are prominent which may be due to poor inspiration. Left lateral CP angle is clear. There is no pneumothorax. IMPRESSION: Cardiomegaly. Moderate right pleural effusion. Possibility of underlying atelectasis/pneumonia is not excluded. Electronically Signed   By: Elmer Picker M.D.   On: 12/04/2021 11:17    ECG & Cardiac Imaging    AFIB, 112, ant infarct - personally reviewed.  Assessment & Plan    1.  Acute encephalopathy: Patient with a history of encephalopathy, presented from hemodialysis  on October 12 secondary to altered mental status with finding of hypercapnia and respiratory acidosis on arrival to the emergency department.  Initially required BiPAP and is now on 2 L nasal cannula.  Remains disoriented but cooperative.  Management per primary team.  2.  Coronary artery disease/supply:demand ischemia: Status post PCI and drug-eluting stent placement to the OM 2 in July 2022.  Otherwise moderate, nonobstructive disease at that time.  Notes indicate that he was complaining of chest pain at dialysis, though he denies this at this morning.  He does state he thinks he had chest pain while eating breakfast yesterday morning.  Currently chest pain-free.  Troponins on arrival mildly elevated at 66  60.  We will  follow-up 2D echocardiogram to assess for new wall motion abnormalities.  Given multiple comorbidities, prefer conservative management if indicated.  Continue Plavix and statin therapy.  He is on a beta-blocker in the outpatient setting though this was initially held due to reported bradycardia on admission.  Look to resume if blood pressures allow.  3.  Reported bradycardia: I reviewed his telemetry and even though the computer reads heart rates of 30s to 40s at time following presentation, his rates have actually been in the 70s to 80s.  Complexes on telemetry following arrival were very tiny and I suspect not being picked up by the computer.  His beta-blocker has been held and with ongoing hypotension despite midodrine, we will continue to hold.  4.  Chronic hypotension: Pressures trending in the 80s to 90s.  Holding beta-blocker.  Continue midodrine.  5.  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  Bibasilar crackles on examination.  Volume management per nephrology/hemodialysis.  Status post thoracentesis October 2 (700 mL).  Home dose of beta-blocker on hold.  Not a candidate for  ACE inhibitor/ARB/Entresto/MRA secondary to history of hypotension.  6.  Permanent atrial fibrillation: See #3.  We will look to resume beta-blocker therapy as blood pressure allows.  He is not on long-term anticoagulation secondary to acute thrombocytopenia.  7.  Thrombocytopenia: Platelets 63,000, which is the lowest he has been in 6 months.  He is on chronic Plavix therapy.  Follow-up.  Signed, Murray Hodgkins, NP 12/17/2021, 12:28 PM  For questions or updates, please contact   Please consult [email or web address] for contact info under Cardiology/STEMI.

## 2021-12-17 NOTE — Progress Notes (Signed)
[facility]  ROUNDING NOTE   Subjective:   Antonio Phillips is a 76 year old male with past medical conditions including diabetes, hyperlipidemia, CAD, hypertension, atrial fibrillation on Eliquis, and end-stage renal disease on hemodialysis.  Patient presents to the emergency department from his dialysis clinic with complaints of chest pain/pressure, hypotension, and decreased mentation.  Patient has been admitted for Acute metabolic encephalopathy [A12.87]  Patient is known to our practice and receives outpatient dialysis treatments at [facility] on a TTS schedule, supervised by Dr. Candiss Norse.  Update Patient resting quietly, alert and oriented Weaned off of BiPAP after dialysis yesterday, placed on 3 L nasal cannula.  Nursing plans to wean later this morning Lower extremity edema unchanged   Objective:  Vital signs in last 24 hours:  Temp:  [97.4 F (36.3 C)-98.3 F (36.8 C)] 97.8 F (36.6 C) (10/13 0624) Pulse Rate:  [33-107] 107 (10/13 0930) Resp:  [4-22] 16 (10/13 0930) BP: (74-108)/(46-93) 95/65 (10/13 0930) SpO2:  [79 %-100 %] 97 % (10/13 0930) Weight:  [96.2 kg] 96.2 kg (10/12 1238)  Weight change:  Filed Weights   12/16/21 1238  Weight: 96.2 kg    Intake/Output: I/O last 3 completed shifts: In: -  Out: 1000 [Other:1000]   Intake/Output this shift:  No intake/output data recorded.  Physical Exam: General:  NAD  Head: Normocephalic, atraumatic.   Eyes: Anicteric  Lungs:  Fine basilar crackles, normal effort, [place] O2  Heart: Irregular rate and rhythm  Abdomen:  Soft, nontender, obese  Extremities: 2+ peripheral edema.  Neurologic: Nonfocal, moving all four extremities  Skin: No lesions  Access: Left upper aVF    Basic Metabolic Panel: Recent Labs  Lab 12/16/21 1224 12/17/21 0501  NA 141 141  K 3.7 3.6  CL 103 101  CO2 28 28  GLUCOSE 101* 72  BUN 31* 19  CREATININE 3.89* 2.80*  CALCIUM 9.5 9.0     Liver Function  Tests: Recent Labs  Lab 12/16/21 1224  AST 16  ALT 7  ALKPHOS 70  BILITOT 1.3*  PROT 6.1*  ALBUMIN 3.4*    No results for input(s): "LIPASE", "AMYLASE" in the last 168 hours. Recent Labs  Lab 12/16/21 1224  AMMONIA 36*     CBC: Recent Labs  Lab 12/16/21 1224 12/17/21 0501  WBC 6.7 4.7  NEUTROABS 3.9  --   HGB 12.0* 10.4*  HCT 42.1 36.3*  MCV 109.9* 111.0*  PLT 80* 63*     Cardiac Enzymes: No results for input(s): "CKTOTAL", "CKMB", "CKMBINDEX", "TROPONINI" in the last 168 hours.  BNP: Invalid input(s): "POCBNP"  CBG: Recent Labs  Lab 12/17/21 0113  GLUCAP 70    Microbiology: Results for orders placed or performed during the hospital encounter of 12/16/21  Blood Culture (routine x 2)     Status: None (Preliminary result)   Collection Time: 12/16/21 12:24 PM   Specimen: BLOOD  Result Value Ref Range Status   Specimen Description BLOOD RIGHT [facility]  Final   Special Requests   Final    BOTTLES DRAWN AEROBIC AND ANAEROBIC Blood Culture results may not be optimal due to an inadequate volume of blood received in culture bottles   Culture  Setup Time   Final    GRAM POSITIVE COCCI ANAEROBIC BOTTLE ONLY CRITICAL VALUE NOTED.  VALUE IS CONSISTENT WITH PREVIOUSLY REPORTED AND CALLED VALUE. Performed at [facility], [address] [place], [place] [address]    Culture [place]  Final   Report  Status PENDING  Incomplete  Blood Culture (routine x 2)     Status: None (Preliminary result)   Collection Time: 12/16/21 12:24 PM   Specimen: BLOOD  Result Value Ref Range Status   Specimen Description BLOOD RIGTH FA  Final   Special Requests   Final    BOTTLES DRAWN AEROBIC AND ANAEROBIC Blood Culture results may not be optimal due to an inadequate volume of blood received in culture bottles   Culture  Setup Time   Final    GRAM POSITIVE COCCI ANAEROBIC BOTTLE ONLY Organism ID to follow CRITICAL RESULT CALLED TO, READ BACK BY AND VERIFIED  WITH: JASON ROBBINS@0626  12/17/21 RH Performed at [facility], [address] [place], [place] [address]    Culture GRAM POSITIVE COCCI  Final   Report Status PENDING  Incomplete  Blood Culture ID Panel (Reflexed)     Status: Abnormal   Collection Time: 12/16/21 12:24 PM  Result Value Ref Range Status   Enterococcus faecalis NOT DETECTED NOT DETECTED Final   Enterococcus Faecium NOT DETECTED NOT DETECTED Final   Listeria monocytogenes NOT DETECTED NOT DETECTED Final   Staphylococcus species DETECTED (A) NOT DETECTED Final    Comment: CRITICAL RESULT CALLED TO, READ BACK BY AND VERIFIED WITH: JASON ROBBINS@0626  12/17/21 RH    Staphylococcus aureus (BCID) NOT DETECTED NOT DETECTED Final   Staphylococcus epidermidis DETECTED (A) NOT DETECTED Final    Comment: Methicillin (oxacillin) resistant coagulase negative staphylococcus. Possible blood culture contaminant (unless isolated from more than one blood culture draw or clinical case suggests pathogenicity). No antibiotic treatment is indicated for blood  culture contaminants. CRITICAL RESULT CALLED TO, READ BACK BY AND VERIFIED WITH: JASON ROBBINS@0626  12/17/21 RH    Staphylococcus lugdunensis NOT DETECTED NOT DETECTED Final   Streptococcus species NOT DETECTED NOT DETECTED Final   Streptococcus agalactiae NOT DETECTED NOT DETECTED Final   Streptococcus pneumoniae NOT DETECTED NOT DETECTED Final   Streptococcus pyogenes NOT DETECTED NOT DETECTED Final   A.calcoaceticus-baumannii NOT DETECTED NOT DETECTED Final   Bacteroides fragilis NOT DETECTED NOT DETECTED Final   Enterobacterales NOT DETECTED NOT DETECTED Final   Enterobacter cloacae complex NOT DETECTED NOT DETECTED Final   Escherichia coli NOT DETECTED NOT DETECTED Final   Klebsiella aerogenes NOT DETECTED NOT DETECTED Final   Klebsiella oxytoca NOT DETECTED NOT DETECTED Final   Klebsiella pneumoniae NOT DETECTED NOT DETECTED Final   Proteus species NOT DETECTED NOT  DETECTED Final   Salmonella species NOT DETECTED NOT DETECTED Final   Serratia marcescens NOT DETECTED NOT DETECTED Final   Haemophilus influenzae NOT DETECTED NOT DETECTED Final   Neisseria meningitidis NOT DETECTED NOT DETECTED Final   Pseudomonas aeruginosa NOT DETECTED NOT DETECTED Final   Stenotrophomonas maltophilia NOT DETECTED NOT DETECTED Final   Candida albicans NOT DETECTED NOT DETECTED Final   Candida auris NOT DETECTED NOT DETECTED Final   Candida glabrata NOT DETECTED NOT DETECTED Final   Candida krusei NOT DETECTED NOT DETECTED Final   Candida parapsilosis NOT DETECTED NOT DETECTED Final   Candida tropicalis NOT DETECTED NOT DETECTED Final   Cryptococcus neoformans/gattii NOT DETECTED NOT DETECTED Final   Methicillin resistance mecA/C DETECTED (A) NOT DETECTED Final    Comment: CRITICAL RESULT CALLED TO, READ BACK BY AND VERIFIED WITH: JASON ROBBINS@0626  12/17/21 RH Performed at [facility], [address] [place], [place]   Resp Panel by RT-PCR (Flu A&B, Covid) Anterior Nasal Swab     Status: None   Collection Time: 12/16/21  1:10 PM   Specimen: Anterior Nasal Swab  Result Value Ref Range Status   SARS Coronavirus 2 by RT PCR NEGATIVE NEGATIVE Final    Comment: (NOTE) SARS-CoV-2 target nucleic acids are NOT DETECTED.  The SARS-CoV-2 RNA is generally detectable in upper respiratory specimens during the acute phase of infection. The lowest concentration of SARS-CoV-2 viral copies this assay can detect is 138 copies/mL. A negative result does not preclude SARS-Cov-2 infection and should not be used as the sole basis for treatment or other patient management decisions. A negative result may occur with  improper specimen collection/handling, submission of specimen other than nasopharyngeal swab, presence of viral mutation(s) within the areas targeted by this assay, and inadequate number of viral copies(<138 copies/mL). A negative result must be  combined with clinical observations, patient history, and epidemiological information. The expected result is Negative.  Fact Sheet for Patients:  EntrepreneurPulse.com.au  Fact Sheet for Healthcare Providers:  IncredibleEmployment.be  This test is no t yet approved or cleared by the [place] FDA and  has been authorized for detection and/or diagnosis of SARS-CoV-2 by FDA under an Emergency Use Authorization (EUA). This EUA will remain  in effect (meaning this test can be used) for the duration of the COVID-19 declaration under Section 564(b)(1) of the Act, 21 U.S.C.section 360bbb-3(b)(1), unless the authorization is terminated  or revoked sooner.       Influenza A by PCR NEGATIVE NEGATIVE Final   Influenza B by PCR NEGATIVE NEGATIVE Final    Comment: (NOTE) The Xpert Xpress SARS-CoV-2/FLU/RSV plus assay is intended as an aid in the diagnosis of influenza from Nasopharyngeal swab specimens and should not be used as a sole basis for treatment. Nasal washings and aspirates are unacceptable for Xpert Xpress SARS-CoV-2/FLU/RSV testing.  Fact Sheet for Patients: EntrepreneurPulse.com.au  Fact Sheet for Healthcare Providers: IncredibleEmployment.be  This test is not yet approved or cleared by the [place] FDA and has been authorized for detection and/or diagnosis of SARS-CoV-2 by FDA under an Emergency Use Authorization (EUA). This EUA will remain in effect (meaning this test can be used) for the duration of the COVID-19 declaration under Section 564(b)(1) of the Act, 21 U.S.C. section 360bbb-3(b)(1), unless the authorization is terminated or revoked.  Performed at [facility], [address] [place], [place] [address]     Coagulation Studies: Recent Labs    12/16/21 1224  LABPROT 16.7*  INR 1.4*     Urinalysis: No results for input(s): "COLORURINE", "LABSPEC", "PHURINE",  "GLUCOSEU", "HGBUR", "BILIRUBINUR", "KETONESUR", "PROTEINUR", "UROBILINOGEN", "NITRITE", "LEUKOCYTESUR" in the last 72 hours.  Invalid input(s): "APPERANCEUR"    Imaging: CT Head Wo Contrast  Result Date: 12/16/2021 CLINICAL DATA:  Altered mental status EXAM: CT HEAD WITHOUT CONTRAST TECHNIQUE: Contiguous axial images were obtained from the base of the skull through the vertex without intravenous contrast. RADIATION DOSE REDUCTION: This exam was performed according to the departmental dose-optimization program which includes automated exposure control, adjustment of the mA and/or kV according to patient size and/or use of iterative reconstruction technique. COMPARISON:  CT T bone 12/04/21 FINDINGS: Brain: No evidence of acute infarction, hemorrhage, hydrocephalus, extra-axial collection or mass lesion/mass effect. There is mineralization of the basal ganglia bilaterally. Advanced generalized volume loss. Vascular: No hyperdense vessel or unexpected calcification. Skull: Normal. Negative for fracture or focal lesion. Sinuses/Orbits: Bilateral lens replacements. Large bilateral mastoid effusions with thinning of the tegmen tympani on the left. There is fluid in the middle ear cavity bilaterally. Other: None. IMPRESSION: 1. No  acute intracranial abnormality. 2. Large bilateral mastoid effusions with thinning of the tegmen tympani and likely erosion through the roof of the mastoid air cells on the left. Consider further evaluation with a contrast enhanced temporal bone CT if the patient has symptoms of otomastoiditis. Electronically Signed   By: Marin Roberts M.D.   On: 12/16/2021 13:33   DG Chest Port 1 View  Result Date: 12/16/2021 CLINICAL DATA:  Questionable sepsis - evaluate for abnormality. EXAM: PORTABLE CHEST 1 VIEW COMPARISON:  Chest radiograph 12/06/2021 FINDINGS: The cardiac silhouette remains enlarged. There is increased pulmonary vascular congestion. There is a moderate right pleural effusion  which has increased in size. There may be a small left pleural effusion. Increased airspace opacities are present in the left greater than right lung bases. No pneumothorax is identified. No acute osseous abnormality is seen. IMPRESSION: Cardiomegaly with increased pulmonary vascular congestion, enlarging moderate right and possible small left pleural effusions, and bibasilar atelectasis or consolidation. Electronically Signed   By: Logan Bores M.D.   On: 12/16/2021 13:10     Medications:    sodium chloride     anticoagulant sodium citrate      artificial tears   Both Eyes BID   atorvastatin  40 mg Oral QHS   calcitRIOL  0.25 mcg Oral Daily   calcium acetate  1,334 mg Oral TID   Chlorhexidine Gluconate Cloth  6 each Topical Q0600   clopidogrel  75 mg Oral Q breakfast   cyanocobalamin  1,000 mcg Oral Daily   folic acid  1 mg Oral Daily   midodrine  10 mg Oral TID WC   midodrine  10 mg Oral Once   pregabalin  75 mg Oral QHS   rOPINIRole  1 mg Oral QHS   sertraline  50 mg Oral Daily   sevelamer carbonate  1,600 mg Oral 3 times per day on Sun Mon Wed Fri   sodium chloride flush  3 mL Intravenous Q12H   sodium chloride, acetaminophen, alteplase, anticoagulant sodium citrate, heparin, lactulose, lidocaine (PF), lidocaine-prilocaine, ondansetron **OR** ondansetron (ZOFRAN) IV, pentafluoroprop-tetrafluoroeth, polyvinyl alcohol, sodium chloride flush  Assessment/ Plan:  [name] is a 76 y.o.  male with past medical conditions including diabetes, hyperlipidemia, CAD, hypertension, atrial fibrillation on Eliquis, and end-stage renal disease on hemodialysis.  Patient presents to the emergency department from his dialysis clinic with complaints of chest pain/pressure, hypotension, and decreased mentation.  Patient has been admitted for Acute metabolic encephalopathy [Q22.29]  [place]/TTS/left AVF  Fluid volume overload with end-stage renal disease on  hemodialysis.  Chest x-ray shows pulmonary vascular congestion.  Patient received urgent dialysis yesterday, UF goal limited to 1 L due to hypotension.  Albumin prescribed for BP support.  Patient appears comfortable, 3 L nasal cannula.  Next dialysis treatment Saturday.  2. Anemia of chronic kidney disease Lab Results  Component Value Date   HGB 10.4 (L) 12/17/2021    Hemoglobin at goal.  Patient receives [place] outpatient.  3. Secondary Hyperparathyroidism: with outpatient labs: PTH 172, phosphorus 5.3, calcium 8.3.  Lab Results  Component Value Date   CALCIUM 9.0 12/17/2021   PHOS 4.4 12/05/2021    We will continue to monitor bone minerals during this admission.  Continue sevelamer and calcium acetate with meals and daily calcitriol.  4. 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 Most recent hemoglobin A1c is 5.5 on 11/10/18.  Glucose well controlled.   LOS: 1 Yadier Bramhall 10/13/202310:44 AM

## 2021-12-17 NOTE — Consult Note (Addendum)
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 Patient is incontinent of stool and due to bilateral amputations has an altered pressure redistribution while sitting. Wound type: pressure, friction, moisture  ICD-10 CM Codes for Irritant Dermatitis  L24A2 - Due to fecal, urinary or dual incontinence L24A9 - Due to friction or contact with other specified body fluids L30.4  - Erythema intertrigo. Also used for abrasion of the hand, chafing of the skin, dermatitis due to sweating and friction, friction dermatitis, friction eczema, and genital/thigh intertrigo.   Pressure Injury POA: Yes Measurement: 20 cm x 15cm area of deeply hued red/purple skin discoloration consistent with chronic friction injury and DTPI. More deeply hued area at left sacrum measuring 4cm x 5cm (DTPI) Wound IYM:EBRA, moist Drainage (amount, consistency, odor) scant serous Periwound:As noted above Dressing procedure/placement/frequency: I will provide the patient with a mattress replacement with low air loss feature and provide guidance for Nursing for turning and repositioning from side to side and minimize time in the supine position. Topical care will be with house skin cleanser applied every 4 hours and PRN soiling (pH balanced, no rinse) and patted dry followed by application of Gerhart's butt cream, a compounded 1:1:1 prescriptive consisting of  zinc oxide:lotrimin cream:hydrocortisone cream applied every 4 hours and PRN soiling.  [facility] nursing team will not follow, but will remain available to this patient, the nursing and medical teams.  Please re-consult if needed.  Thank you for inviting  [place] to participate in this patient's Plan of Care.  Maudie Flakes, MSN, RN, CNS, [place], Serita Grammes, [employer], [employer] phone:  [phone]

## 2021-12-17 NOTE — ED Notes (Signed)
Pt had small amount of urine in brief. Brief changed, pt cream placed on buttocks. Pt bedding and gown changed. Pt resting in bed no needs at this time,

## 2021-12-17 NOTE — Consult Note (Signed)
Consultation Note Date: 12/17/2021   Patient Name: Antonio Phillips  DOB: [birth year]  MRN: [id number]  Age / Sex: 76 y.o., male  PCP: Marsh Dolly, MD Referring Physician: Oswald Hillock, MD  Reason for Consultation: Establishing goals of care  HPI/Patient Profile: 76 y.o. male  with past medical history of ESRD on HD, A-fib on Eliquis, H TN/HLD, morbid obesity, bilateral hip replacement through the [place] with PAD and subsequent bilateral lower extremity BKA, wheelchair-bound, resident of long-term care facility [facility] (per patient for 6 years) admitted on 12/16/2021 with acute encephalopathy in a patient with a history of encephalopathy with hypercapnia requiring BiPAP.   Clinical Assessment and Goals of Care: I have reviewed medical records including EPIC notes, labs and imaging, received report from RN, assessed the patient.  Antonio Phillips is lying quietly on the stretcher in the ED eating eggs for breakfast.  He greets me, making and somewhat keeping eye contact.  He appears acutely/chronically ill and quite frail.  He is alert and oriented, able to make his basic needs known.  There is no family at bedside at this time.  We meet at bedside to discuss diagnosis prognosis, GOC, EOL wishes, disposition and options.  I introduced Palliative Medicine as specialized medical care for people living with serious illness. It focuses on providing relief from the symptoms and stress of a serious illness. The goal is to improve quality of life for both the patient and the family.  A brief life review of the patient per previous palliative consult.  He is an Scientist, research (life sciences).  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  He was homeless for some time before becoming a long-term resident at [facility] about a year ago.  He is divorced and  never had any children.  He shares he has no living relatives.  We then focused on their current illness.  Antonio Phillips tells me that his foot is hurting.  We talk about his wounds.  He shares that they do provide wound care at [facility].  We talk about his kidney disease and hemodialysis.  He is able to tell me where he takes dialysis and when he last had the treatments.  He shares that he will continue with HD as offered.  We talk about his acute confusion, heart failure, fluid overload, the need for BiPAP.  He tells me that he only uses oxygen at his facility at night, no BiPAP.  I asked if he needed this, when he wears it.  He tells me that he probably would not wear BiPAP on an ongoing basis.  We talked about the importance of weight loss as his obese abdomen is interfering with his breathing.  The natural disease trajectory and expectations at EOL were discussed.  Advanced directives, concepts specific to code status, artifical feeding and hydration, and rehospitalization were considered and discussed.  DNR verified.  Palliative Care services outpatient were  explained and offered.  Antonio Phillips was agreeable to outpatient palliative services during his November/December 2022 hospital visit.  He is agreeable to outpatient palliative services again today.  Inpatient [facility] coordinator notified.  Discussed the importance of continued conversation with family and the medical providers regarding overall plan of care and treatment options, ensuring decisions are within the context of the patient's values and GOCs.  Questions and concerns were addressed.  The patient was encouraged to call with questions or concerns.  PMT will continue to support holistically.  Conference with attending, bedside nursing staff, transition of care team, [facility] inpatient representative related to patient condition, needs, goals of care, disposition.   HCPOA NEXT OF KIN -I asked Antonio Phillips who would make choices if he  were unable.  He does not give me a direct answer.  I ask if it would be April.  He asked how I got this name and asked shared that it was in his chart.  He shares that he has not seen April Baldwin in about a year.  I asked he would make choices if he were unable and he is unable to name a healthcare surrogate.  He tells me that he has an aunt, but she is 50 years old and does not want anyone to call her.  I encouraged him to work with the Education officer, museum at [facility] upon his return.    SUMMARY OF RECOMMENDATIONS   Continue to treat the treatable but no CPR or intubation Continue HD as tolerated/offered Return to [facility] under long-term care where he has been for 6 years [facility] for outpatient palliative  Code Status/Advance Care Planning: DNR  Symptom Management:  Per hospitalist, no additional needs at this time.  Palliative Prophylaxis:  Frequent Pain Assessment, Oral Care, Palliative Wound Care, and Turn Reposition  Additional Recommendations (Limitations, Scope, Preferences): At this point continue to treat the treatable but no CPR or intubation  Psycho-social/Spiritual:  Desire for further Chaplaincy support:no Additional Recommendations: Caregiving  Support/Resources and Referral to [employer]   Prognosis:  Unable to determine, based on outcomes.  6 months or less would not be surprising based on chronic illness burden, poor functional status.  Discharge Planning: Anticipate return to long-term care facility, [facility].      Primary Diagnoses: Present on Admission:  Acute metabolic encephalopathy  Sinus bradycardia  Permanent atrial fibrillation (HCC)  PVD (peripheral vascular disease) ([place])  Morbid obesity (HCC)  ESRD (end stage renal disease) (HCC)  Thrombocytopenia (HCC)  Acute on chronic respiratory failure with hypoxia and hypercapnia (HCC)  Acute on chronic systolic CHF (congestive heart failure) (HCC)  Pressure injury of skin   Hypotension  Elevated troponin   I have reviewed the medical record, interviewed the patient and family, and examined the patient. The following aspects are pertinent.  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 02/2021 Echo:  EF 40-45%, nl RV fxn, midlly dil LA, mild MR.   Coronary artery disease    a. 09/2020 Cath/PCI: LM nl, LAD 50ost/p, LCX min irregs, OM2 99 (2.5x15 Resolute Onyx DES), RCA mild diff dzs, RPDA nl.   Demand ischemia    a. 11/2017 elev Trop in setting of AFib/SVT-->Echo [place]): EF>55%. Mild LVH. Nl RV fxn.   Diabetes ([place])    a. Pt states that he has no hx of diabetes--A1c 5.5 11/2018.   ESRD (end stage renal disease) ([place])  a. On HD since 2019.   Hyperlipidemia    Hypotension    a. On midodrine.   Ischemic cardiomyopathy    a. 02/2021 Echo:  EF 40-45%.   Morbid obesity ([place])    PAD (peripheral artery disease) ([place])    a. s/p L AKA and R BKA.   Permanent atrial fibrillation ([place])    a. Dx 2019. CHA2DS2VASc = 3-4-->Eliquis.   Social History   Socioeconomic History   Marital status: Single    Spouse name: Not on file   Number of children: Not on file   Years of education: Not on file   Highest education level: Not on file  Occupational History   Not on file  Tobacco Use   Smoking status: Never   Smokeless tobacco: Never  Vaping Use   Vaping Use: Never used  Substance and Sexual Activity   Alcohol use: Never   Drug use: Never   Sexual activity: Not on file  Other Topics Concern   Not on file  Social History Narrative   Lives @ local nsg facility.  Does not routinely exercise.   Social Determinants of Health   Financial Resource Strain: Not on file  Food Insecurity: No Food Insecurity (12/05/2021)   Hunger Vital Sign    Worried About Running Out of Food in the Last Year: Never true    Ran Out of Food in the Last Year: Never true  Transportation Needs: No Transportation Needs  (12/05/2021)   PRAPARE - Hydrologist (Medical): No    Lack of Transportation (Non-Medical): No  Physical Activity: Not on file  Stress: Not on file  Social Connections: Not on file   History reviewed. No pertinent family history. Scheduled Meds:  artificial tears   Both Eyes BID   atorvastatin  40 mg Oral QHS   calcitRIOL  0.25 mcg Oral Daily   calcium acetate  1,334 mg Oral TID   Chlorhexidine Gluconate Cloth  6 each Topical Q0600   clopidogrel  75 mg Oral Q breakfast   cyanocobalamin  1,000 mcg Oral Daily   folic acid  1 mg Oral Daily   Gerhardt's butt cream   Topical Q4H   midodrine  10 mg Oral TID WC   midodrine  10 mg Oral Once   pregabalin  75 mg Oral QHS   rOPINIRole  1 mg Oral QHS   sertraline  50 mg Oral Daily   sevelamer carbonate  1,600 mg Oral 3 times per day on Sun Mon Wed Fri   sodium chloride flush  3 mL Intravenous Q12H   Continuous Infusions:  sodium chloride     [START ON 12/18/2021] albumin human     anticoagulant sodium citrate     PRN Meds:.sodium chloride, acetaminophen, alteplase, anticoagulant sodium citrate, heparin, lactulose, lidocaine (PF), lidocaine-prilocaine, ondansetron **OR** ondansetron (ZOFRAN) IV, pentafluoroprop-tetrafluoroeth, polyvinyl alcohol, sodium chloride flush Medications Prior to Admission:  Prior to Admission medications   Medication Sig Start Date End Date Taking? Authorizing Provider  atorvastatin (LIPITOR) 40 MG tablet Take 40 mg by mouth at bedtime. 09/17/20  Yes [provider]  calcitRIOL (ROCALTROL) 0.25 MCG capsule Take 1 tablet by mouth daily. 10/04/21  Yes [provider]  calcium acetate (PHOSLO) 667 MG capsule Take 1,334 mg by mouth 3 (three) times daily. 12/02/21  Yes [provider]  Carboxymethylcellulose Sod PF 0.5 % SOLN Place 1 drop into both eyes 3 (three) times daily as needed (dry eyes).  Yes [provider]  clopidogrel (PLAVIX) 75 MG tablet Take 1  tablet (75 mg total) by mouth daily with breakfast. 09/23/20  Yes Wieting, Richard, MD  diclofenac Sodium (VOLTAREN) 1 % GEL Apply 2 g topically in the morning. (Apply to right stump)   Yes [provider]  folic acid (FOLVITE) 1 MG tablet Take 1 mg by mouth daily. 06/21/21  Yes [provider]  HYDROcodone-acetaminophen (NORCO/VICODIN) 5-325 MG tablet Take 1 tablet by mouth every 6 (six) hours as needed for moderate pain.   Yes [provider]  metoprolol tartrate (LOPRESSOR) 25 MG tablet Take 0.5 tablets (12.5 mg total) by mouth 2 (two) times daily. Hold on dialysis days.  Other days hold if sbp <90 or HR <60 12/07/21  Yes Nolberto Hanlon, MD  midodrine (PROAMATINE) 10 MG tablet Take 1 tablet (10 mg total) by mouth 3 (three) times daily with meals. 12/07/21 01/06/22 Yes Nolberto Hanlon, MD  mupirocin ointment (BACTROBAN) 2 % Place 1 Application into the nose 2 (two) times daily. 12/07/21  Yes Nolberto Hanlon, MD  pregabalin (LYRICA) 100 MG capsule Take 1 capsule (100 mg total) by mouth 2 (two) times daily. Patient taking differently: Take 75 mg by mouth at bedtime. 02/09/21  Yes Lorella Nimrod, MD  pregabalin (LYRICA) 75 MG capsule Take 75 mg by mouth daily. 11/22/21  Yes [provider]  REFRESH LIQUIGEL 1 % GEL Place 1 drop into both eyes in the morning and at bedtime. 11/24/21  Yes [provider]  REFRESH TEARS 0.5 % SOLN Apply to eye. 10/21/21  Yes [provider]  rOPINIRole (REQUIP) 1 MG tablet Take 1 mg by mouth at bedtime. 03/13/19  Yes [provider]  sertraline (ZOLOFT) 50 MG tablet Take 50 mg by mouth daily. 12/02/21  Yes [provider]  sevelamer carbonate (RENVELA) 800 MG tablet Take 1,600 mg by mouth See admin instructions. Take 2 tablets (1600mg ) by mouth three times daily with meals on Monday, Wednesday, Friday and Sunday   Yes [provider]  vitamin B-12 (CYANOCOBALAMIN) 1000 MCG tablet Take 1,000 mcg by mouth daily.    Yes [provider]  acetaminophen (TYLENOL) 325 MG tablet Take 2 tablets (650 mg total) by mouth every 6 (six) hours as needed for mild pain or moderate pain. 01/17/20   Loletha Grayer, MD  cephALEXin (KEFLEX) 500 MG capsule Take 500 mg by mouth 2 (two) times daily. Patient not taking: Reported on 12/16/2021 08/13/21   [provider]  CONSTULOSE 10 GM/15ML solution Take by mouth. 12/08/21   [provider]  fluconazole (DIFLUCAN) 200 MG tablet Take by mouth. Patient not taking: Reported on 12/16/2021 06/21/21   [provider]  HIBICLENS 4 % external liquid Apply topically. Patient not taking: Reported on 12/16/2021 11/04/21   [provider]  lactulose, encephalopathy, (CHRONULAC) 10 GM/15ML SOLN Take 10 g by mouth 2 (two) times daily as needed (mild constipation).    [provider]  metoprolol succinate (TOPROL-XL) 50 MG 24 hr tablet Take 50 mg by mouth daily. 12/02/21   [provider]  nystatin (MYCOSTATIN/NYSTOP) powder Apply topically. 11/04/21   [provider]  terbinafine (LAMISIL) 250 MG tablet Take 250 mg by mouth daily. Patient not taking: Reported on 12/16/2021 11/18/21   [provider]   Allergies  Allergen Reactions   Simvastatin Other (See Comments)   Review of Systems  Unable to perform ROS: Other    Physical Exam Vitals and nursing note reviewed.  Constitutional:      General: He is not in acute distress.    Appearance: He is obese. He is ill-appearing.  Pulmonary:     Effort: Pulmonary effort is normal.  Abdominal:     Palpations: Abdomen is soft.     Comments: Obese abdomen  Musculoskeletal:     Comments: BL LE BKA  Skin:    General: Skin is warm and dry.  Neurological:     Mental Status: He is alert and oriented to person, place, and time.  Psychiatric:        Mood and Affect: Mood normal.        Behavior: Behavior normal.     Vital Signs: BP (!) 91/53   Pulse 94   Temp  97.7 F (36.5 C) (Oral)   Resp 11   Ht 4\' 3"  (1.295 m)   Wt 96.2 kg   SpO2 98%   BMI 57.31 kg/m          SpO2: SpO2: 98 % O2 Device:SpO2: 98 % O2 Flow Rate: .O2 Flow Rate (L/min): 5 L/min  IO: Intake/output summary:  Intake/Output Summary (Last 24 hours) at 12/17/2021 1342 Last data filed at 12/16/2021 2045 Gross per 24 hour  Intake --  Output 1000 ml  Net -1000 ml    LBM:   Baseline Weight: Weight: 96.2 kg Most recent weight: Weight:  (unable to weigh, pt in stretcher, Bedside HD in ED)     Palliative Assessment/Data:   Flowsheet Rows    Flowsheet Row Most Recent Value  Intake Tab   Referral Department Hospitalist  Unit at Time of Referral ER  Palliative Care Primary Diagnosis Neurology  Date Notified 12/16/21  Palliative Care Type Return patient Palliative Care  Reason for referral Clarify Goals of Care  Date of Admission 12/16/21  Date first seen by Palliative Care 12/17/21  # of days Palliative referral response time 1 Day(s)  # of days IP prior to Palliative referral 0  Clinical Assessment   Palliative Performance Scale Score 20%  Pain Max last 24 hours Not able to report  Pain Min Last 24 hours Not able to report  Dyspnea Max Last 24 Hours Not able to report  Dyspnea Min Last 24 hours Not able to report  Psychosocial & Spiritual Assessment   Palliative Care Outcomes        Time In: 0820 Time Out: 0935  Time Total: 75 minutes  Greater than 50%  of this time was spent counseling and coordinating care related to the above assessment and plan.  Signed by: Drue Novel, NP   Please contact Palliative Medicine Team phone at [phone] for questions and concerns.  For individual provider: See Shea Evans

## 2021-12-17 NOTE — ED Notes (Signed)
Pt brief dry, pt stating they do not make urine

## 2021-12-17 NOTE — ED Notes (Signed)
Pt o2 changed to 5L [place] for o2 sat while sleeping

## 2021-12-17 NOTE — ED Notes (Signed)
Pt alert at this time, given lunch tray, pt requesting no lunch as they are not hungry at this time.

## 2021-12-18 DIAGNOSIS — I959 Hypotension, unspecified: Secondary | ICD-10-CM

## 2021-12-18 DIAGNOSIS — D696 Thrombocytopenia, unspecified: Secondary | ICD-10-CM | POA: Diagnosis not present

## 2021-12-18 DIAGNOSIS — N186 End stage renal disease: Secondary | ICD-10-CM | POA: Diagnosis not present

## 2021-12-18 DIAGNOSIS — I4821 Permanent atrial fibrillation: Secondary | ICD-10-CM | POA: Diagnosis not present

## 2021-12-18 LAB — CBC
HCT: 35.6 % — ABNORMAL LOW (ref 39.0–52.0)
Hemoglobin: 10.3 g/dL — ABNORMAL LOW (ref 13.0–17.0)
MCH: 32 pg (ref 26.0–34.0)
MCHC: 28.9 g/dL — ABNORMAL LOW (ref 30.0–36.0)
MCV: 110.6 fL — ABNORMAL HIGH (ref 80.0–100.0)
Platelets: 20 10*3/uL — CL (ref 150–400)
RBC: 3.22 MIL/uL — ABNORMAL LOW (ref 4.22–5.81)
RDW: 20.2 % — ABNORMAL HIGH (ref 11.5–15.5)
WBC: 4.2 10*3/uL (ref 4.0–10.5)
nRBC: 0.5 % — ABNORMAL HIGH (ref 0.0–0.2)

## 2021-12-18 LAB — BASIC METABOLIC PANEL
Anion gap: 10 (ref 5–15)
BUN: 28 mg/dL — ABNORMAL HIGH (ref 8–23)
CO2: 27 mmol/L (ref 22–32)
Calcium: 9 mg/dL (ref 8.9–10.3)
Chloride: 101 mmol/L (ref 98–111)
Creatinine, Ser: 3.59 mg/dL — ABNORMAL HIGH (ref 0.61–1.24)
GFR, Estimated: 17 mL/min — ABNORMAL LOW (ref >60)
Glucose, Bld: 78 mg/dL (ref 70–99)
Potassium: 4 mmol/L (ref 3.5–5.1)
Sodium: 138 mmol/L (ref 135–145)

## 2021-12-18 LAB — TROPONIN I (HIGH SENSITIVITY)
Troponin I (High Sensitivity): 75 ng/L — ABNORMAL HIGH (ref <18)
Troponin I (High Sensitivity): 77 ng/L — ABNORMAL HIGH (ref <18)

## 2021-12-18 LAB — CBG MONITORING, ED: Glucose-Capillary: 89 mg/dL (ref 70–99)

## 2021-12-18 LAB — HEPATITIS B SURFACE ANTIBODY, QUANTITATIVE: Hep B S AB Quant (Post): <3.1 m[IU]/mL — ABNORMAL LOW (ref >9.9)

## 2021-12-18 MED ORDER — ALBUMIN HUMAN 25 % IV SOLN
INTRAVENOUS | Status: AC
  Filled 2021-12-18: qty 100

## 2021-12-18 NOTE — Progress Notes (Signed)
Pre hd rn assessment 

## 2021-12-18 NOTE — Progress Notes (Signed)
[facility]  PROGRESS NOTE  Antonio Phillips FBP:[id number] DOB: 01/24/1946 DOA: 12/16/2021 PCP: Marsh Dolly, MD   Brief HPI:   76 year old male with medical history for ESRD on hemodialysis Tuesday Thursday Saturday, history of chronic hypoxic respiratory failure on 2 L of oxygen via nasal cannula, CAD s/p PCI with stent angioplasty 7/22, history of permanent A-fib not on anticoagulation, peripheral arterial disease s/p right BKA and left AKA, history of prostate cancer who was sent to ED from dialysis center for evaluation of chest pain and mental status changes. Patient was found to be in uncompensated respiratory stenosis with hypercapnia and was placed on BiPAP. Nephrology was consulted for hemodialysis   Subjective   Patient seen and examined, breathing is improved.  Denies any chest pain.  Mental status has significantly improved.   Assessment/Plan:    Acute metabolic encephalopathy -Secondary to uncompensated respiratory acidosis with hypercapnia -Mental status has significantly improved  Acute on chronic respiratory failure with hypoxia and hypercapnia -Presented with mental status changes, found to have uncompensated respiratory acidosis on ABG -At baseline he wears 2 L of oxygen continuously -Started on BiPAP in the ED -BiPAP has been turned off now as patient could not tolerate it, placed back on oxygen via nasal cannula 3 L/min  Sinus bradycardia -Likely in setting of obstructive sleep apnea/obesity and also metoprolol use -Metoprolol is currently on hold -TSH 6.049  Bacteremia -Blood cultures growing Staph epidermidis could be contamination, however patient is hypotensive -Started on  vancomycin per pharmacy consultation -We will await final culture results.  Hypotension -Systolic blood pressure has been hanging around in 80s -Patient has been on midodrine 10 mg 3 times daily -Dose of midodrine changed to 20 mg p.o. 3 times daily -Patient also  received normal saline bolus to 50 cc x 1 for hypotension yesterday.   Acute on chronic systolic CHF -Chest x-ray showed bilateral pleural effusions with pulmonary vascular congestion -LVEF 40 to 45% as per echo from December 2020 from -Continue renal replacement therapy  ESRD -Patient has history of ESRD and is on hemodialysis Tuesday Thursday and Saturday -He has history of noncompliance with dialysis -Nephrology consulted  Pressure injury of the skin -Patient has unstageable pressure ulcers over the gluteal region -Wound care consulted  Thrombocytopenia -Chronic  Mild elevation of troponin -Troponins mildly elevated, 66, 60 -Likely due to demand ischemia in the setting of acute on chronic systolic CHF -Echocardiogram ordered; will follow results -Plavix has been discontinued due to thrombocytopenia  Thrombocytopenia -Platelet count is down to 20,000 today -No signs symptoms of bleeding -Plavix discontinued  Permanent atrial fibrillation -Metoprolol on hold due to bradycardia and hypotension -Not on long-term anticoagulation due to thrombocytopenia with increased risk of bleeding -Heart rate fairly well controlled, less than 100  PVD -S/p right BKA and left AKA -Continue atorvastatin, Plavix on hold due to  thrombocytopenia   Medications     artificial tears   Both Eyes BID   atorvastatin  40 mg Oral QHS   calcitRIOL  0.25 mcg Oral Daily   calcium acetate  1,334 mg Oral TID   Chlorhexidine Gluconate Cloth  6 each Topical Q0600   cyanocobalamin  1,000 mcg Oral Daily   folic acid  1 mg Oral Daily   Gerhardt's butt cream   Topical Q4H   midodrine  20 mg Oral TID WC   pregabalin  75 mg Oral QHS   rOPINIRole  1 mg Oral QHS   sertraline  50 mg Oral Daily  sevelamer carbonate  1,600 mg Oral 3 times per day on Sun Mon Wed Fri   sodium chloride flush  3 mL Intravenous Q12H     Data Reviewed:   CBG:  Recent Labs  Lab 12/17/21 0113 12/18/21 0250  GLUCAP 80  89    SpO2: 98 % O2 Flow Rate (L/min): 3 L/min FiO2 (%): 35 %    Vitals:   12/18/21 1500 12/18/21 1530 12/18/21 1600 12/18/21 1630  BP: (!) 98/59 102/70 90/60 (!) 101/57  Pulse: 87 85 88 80  Resp: 16 11 (!) 9 15  Temp:      TempSrc:      SpO2: 100% 100% 99% 98%  Weight:      Height:          Data Reviewed:  Basic Metabolic Panel: Recent Labs  Lab 12/16/21 1224 12/17/21 0501 12/18/21 0721  NA 141 141 138  K 3.7 3.6 4.0  CL 103 101 101  CO2 28 28 27   GLUCOSE 101* 72 78  BUN 31* 19 28*  CREATININE 3.89* 2.80* 3.59*  CALCIUM 9.5 9.0 9.0    CBC: Recent Labs  Lab 12/16/21 1224 12/17/21 0501 12/18/21 0721  WBC 6.7 4.7 4.2  NEUTROABS 3.9  --   --   HGB 12.0* 10.4* 10.3*  HCT 42.1 36.3* 35.6*  MCV 109.9* 111.0* 110.6*  PLT 80* 63* 20*    LFT Recent Labs  Lab 12/16/21 1224  AST 16  ALT 7  ALKPHOS 70  BILITOT 1.3*  PROT 6.1*  ALBUMIN 3.4*     Antibiotics: Anti-infectives (From admission, onward)    Start     Dose/Rate Route Frequency Ordered Stop   12/18/21 2000  vancomycin (VANCOCIN) IVPB 1000 mg/200 mL premix        1,000 mg 200 mL/hr over 60 Minutes Intravenous Every T-Th-Sa (Hemodialysis) 12/17/21 1739     12/17/21 1830  vancomycin (VANCOREADY) IVPB 2000 mg/400 mL        2,000 mg 200 mL/hr over 120 Minutes Intravenous  Once 12/17/21 1738 12/17/21 2015        DVT prophylaxis: None, patient has thrombocytopenia, and bilateral lower extremity amputation  Code Status: DNR  Family Communication: No family at bedside   CONSULTS nephrology   Objective    Physical Examination:   General-appears in no acute distress Heart-S1-S2, irregular, no murmur auscultated Lungs-clear to auscultation bilaterally, no wheezing or crackles auscultated Abdomen-soft, nontender, no organomegaly Extremities-no edema in the lower extremities Neuro-alert, oriented x3, no focal deficit noted   Status is: Inpatient:             Oswald Hillock   [facility] If 7PM-7AM, please contact night-coverage at [email or web address], Office  [phone]   12/18/2021, 4:57 PM  LOS: 2 days

## 2021-12-18 NOTE — ED Notes (Signed)
Hospital coverage paged by RN to request evaluation of pt bp. Pt has been hypotensive with prior RN's as well and prior RN stated no new orders upon page for evaluation. This RN wishing to verify.

## 2021-12-18 NOTE — Progress Notes (Signed)
Critical lab received. Platelets = 20. Page sent to rounding MD team 8. Awaiting call back

## 2021-12-18 NOTE — Progress Notes (Signed)
[facility]  ROUNDING NOTE   Subjective:   Antonio Phillips is a 76 year old male with past medical conditions including diabetes, hyperlipidemia, CAD, hypertension, atrial fibrillation on Eliquis, and end-stage renal disease on hemodialysis.  Patient presents to the emergency department from his dialysis clinic with complaints of chest pain/pressure, hypotension, and decreased mentation.  Patient has been admitted for Acute respiratory failure with hypercapnia ([place]) [J96.02] Acute encephalopathy [G93.40] CO2 narcosis [R06.89] ESRD on hemodialysis ([place]) [N18.6, T15.7] Acute metabolic encephalopathy [W62.03]  Patient is known to our practice and receives outpatient dialysis treatments at [facility] on a TTS schedule, supervised by Dr. Candiss Norse.  Patient was seen today on dialysis unit Patient tolerating treatment well Patient offers no new specific physical complaints   Objective:  Vital signs in last 24 hours:  Temp:  [97.6 F (36.4 C)-98.5 F (36.9 C)] 97.8 F (36.6 C) (10/14 1437) Pulse Rate:  [49-112] 85 (10/14 1530) Resp:  [9-22] 11 (10/14 1530) BP: (67-102)/(46-70) 102/70 (10/14 1530) SpO2:  [87 %-100 %] 100 % (10/14 1530) FiO2 (%):  [35 %] 35 % (10/14 0312) Weight:  [95.8 kg] 95.8 kg (10/14 0328)  Weight change: -0.363 kg Filed Weights   12/16/21 1238 12/18/21 0328  Weight: 96.2 kg 95.8 kg    Intake/Output: I/O last 3 completed shifts: In: -  Out: 1000 [Other:1000]   Intake/Output this shift:  Total I/O In: 21 [I.V.:3; IV Piggyback:18] Out: 0   Physical Exam: General:  NAD  Head: Normocephalic, atraumatic.   Eyes: Anicteric  Lungs:  Fine basilar crackles, normal effort, [place] O2  Heart: Irregular rate and rhythm  Abdomen:  Soft, nontender, obese  Extremities: 1+ peripheral edema.  Neurologic: Nonfocal, moving all four extremities  Skin: No lesions  Access: Left upper aVF-2 needles in situ    Basic Metabolic Panel: Recent Labs   Lab 12/16/21 1224 12/17/21 0501 12/18/21 0721  NA 141 141 138  K 3.7 3.6 4.0  CL 103 101 101  CO2 28 28 27   GLUCOSE 101* 72 78  BUN 31* 19 28*  CREATININE 3.89* 2.80* 3.59*  CALCIUM 9.5 9.0 9.0    Liver Function Tests: Recent Labs  Lab 12/16/21 1224  AST 16  ALT 7  ALKPHOS 70  BILITOT 1.3*  PROT 6.1*  ALBUMIN 3.4*   No results for input(s): "LIPASE", "AMYLASE" in the last 168 hours. Recent Labs  Lab 12/16/21 1224  AMMONIA 36*    CBC: Recent Labs  Lab 12/16/21 1224 12/17/21 0501 12/18/21 0721  WBC 6.7 4.7 4.2  NEUTROABS 3.9  --   --   HGB 12.0* 10.4* 10.3*  HCT 42.1 36.3* 35.6*  MCV 109.9* 111.0* 110.6*  PLT 80* 63* 20*    Cardiac Enzymes: No results for input(s): "CKTOTAL", "CKMB", "CKMBINDEX", "TROPONINI" in the last 168 hours.  BNP: Invalid input(s): "POCBNP"  CBG: Recent Labs  Lab 12/17/21 0113 12/18/21 0250  GLUCAP 80 22    Microbiology: Results for orders placed or performed during the hospital encounter of 12/16/21  Blood Culture (routine x 2)     Status: Abnormal (Preliminary result)   Collection Time: 12/16/21 12:24 PM   Specimen: BLOOD  Result Value Ref Range Status   Specimen Description   Final    BLOOD RIGHT [facility] Performed at [facility], [address], [place], [place] [address]    Special Requests   Final    BOTTLES DRAWN AEROBIC AND ANAEROBIC Blood Culture results may not be optimal due to  an inadequate volume of blood received in culture bottles Performed at [facility], [address] [place], [place] [address]    Culture  Setup Time   Final    GRAM POSITIVE COCCI IN BOTH AEROBIC AND ANAEROBIC BOTTLES CRITICAL VALUE NOTED.  VALUE IS CONSISTENT WITH PREVIOUSLY REPORTED AND CALLED VALUE. Performed at [facility], [address] [place], [place] [address]    Culture STAPHYLOCOCCUS EPIDERMIDIS (A)  Final   Report Status PENDING  Incomplete  Blood Culture (routine x 2)     Status:  Abnormal (Preliminary result)   Collection Time: 12/16/21 12:24 PM   Specimen: BLOOD  Result Value Ref Range Status   Specimen Description   Final    BLOOD BLOOD RIGHT FOREARM Performed at [facility], [place] [address] [place], [place] [address]    Special Requests   Final    BOTTLES DRAWN AEROBIC AND ANAEROBIC Blood Culture results may not be optimal due to an inadequate volume of blood received in culture bottles Performed at [facility], [address] [place], [place] [address]    Culture  Setup Time   Final    GRAM POSITIVE COCCI IN BOTH AEROBIC AND ANAEROBIC BOTTLES Organism ID to follow CRITICAL RESULT CALLED TO, READ BACK BY AND VERIFIED WITH: JASON ROBBINS@0626  12/17/21 RH Performed at [facility], [address] [place], [place] [address]    Culture (A)  Final    STAPHYLOCOCCUS EPIDERMIDIS SUSCEPTIBILITIES TO FOLLOW Performed at [facility], [place] [address], [place], [place] [address]    Report Status PENDING  Incomplete  Blood Culture ID Panel (Reflexed)     Status: Abnormal   Collection Time: 12/16/21 12:24 PM  Result Value Ref Range Status   Enterococcus faecalis NOT DETECTED NOT DETECTED Final   Enterococcus Faecium NOT DETECTED NOT DETECTED Final   Listeria monocytogenes NOT DETECTED NOT DETECTED Final   Staphylococcus species DETECTED (A) NOT DETECTED Final    Comment: CRITICAL RESULT CALLED TO, READ BACK BY AND VERIFIED WITH: JASON ROBBINS@0626  12/17/21 RH    Staphylococcus aureus (BCID) NOT DETECTED NOT DETECTED Final   Staphylococcus epidermidis DETECTED (A) NOT DETECTED Final    Comment: Methicillin (oxacillin) resistant coagulase negative staphylococcus. Possible blood culture contaminant (unless isolated from more than one blood culture draw or clinical case suggests pathogenicity). No antibiotic treatment is indicated for blood  culture contaminants. CRITICAL RESULT CALLED TO, READ BACK BY AND VERIFIED WITH: JASON  ROBBINS@0626  12/17/21 RH    Staphylococcus lugdunensis NOT DETECTED NOT DETECTED Final   Streptococcus species NOT DETECTED NOT DETECTED Final   Streptococcus agalactiae NOT DETECTED NOT DETECTED Final   Streptococcus pneumoniae NOT DETECTED NOT DETECTED Final   Streptococcus pyogenes NOT DETECTED NOT DETECTED Final   A.calcoaceticus-baumannii NOT DETECTED NOT DETECTED Final   Bacteroides fragilis NOT DETECTED NOT DETECTED Final   Enterobacterales NOT DETECTED NOT DETECTED Final   Enterobacter cloacae complex NOT DETECTED NOT DETECTED Final   Escherichia coli NOT DETECTED NOT DETECTED Final   Klebsiella aerogenes NOT DETECTED NOT DETECTED Final   Klebsiella oxytoca NOT DETECTED NOT DETECTED Final   Klebsiella pneumoniae NOT DETECTED NOT DETECTED Final   Proteus species NOT DETECTED NOT DETECTED Final   Salmonella species NOT DETECTED NOT DETECTED Final   Serratia marcescens NOT DETECTED NOT DETECTED Final   Haemophilus influenzae NOT DETECTED NOT DETECTED Final   Neisseria meningitidis NOT DETECTED NOT DETECTED Final   Pseudomonas aeruginosa NOT DETECTED NOT DETECTED Final  Stenotrophomonas maltophilia NOT DETECTED NOT DETECTED Final   Candida albicans NOT DETECTED NOT DETECTED Final   Candida auris NOT DETECTED NOT DETECTED Final   Candida glabrata NOT DETECTED NOT DETECTED Final   Candida krusei NOT DETECTED NOT DETECTED Final   Candida parapsilosis NOT DETECTED NOT DETECTED Final   Candida tropicalis NOT DETECTED NOT DETECTED Final   Cryptococcus neoformans/gattii NOT DETECTED NOT DETECTED Final   Methicillin resistance mecA/C DETECTED (A) NOT DETECTED Final    Comment: CRITICAL RESULT CALLED TO, READ BACK BY AND VERIFIED WITH: JASON ROBBINS@0626  12/17/21 RH Performed at [facility], [address], [place], [place] [address]   Resp Panel by RT-PCR (Flu A&B, Covid) Anterior Nasal Swab     Status: None   Collection Time: 12/16/21  1:10 PM   Specimen: Anterior Nasal  Swab  Result Value Ref Range Status   SARS Coronavirus 2 by RT PCR NEGATIVE NEGATIVE Final    Comment: (NOTE) SARS-CoV-2 target nucleic acids are NOT DETECTED.  The SARS-CoV-2 RNA is generally detectable in upper respiratory specimens during the acute phase of infection. The lowest concentration of SARS-CoV-2 viral copies this assay can detect is 138 copies/mL. A negative result does not preclude SARS-Cov-2 infection and should not be used as the sole basis for treatment or other patient management decisions. A negative result may occur with  improper specimen collection/handling, submission of specimen other than nasopharyngeal swab, presence of viral mutation(s) within the areas targeted by this assay, and inadequate number of viral copies(<138 copies/mL). A negative result must be combined with clinical observations, patient history, and epidemiological information. The expected result is Negative.  Fact Sheet for Patients:  EntrepreneurPulse.com.au  Fact Sheet for Healthcare Providers:  IncredibleEmployment.be  This test is no t yet approved or cleared by the [place] FDA and  has been authorized for detection and/or diagnosis of SARS-CoV-2 by FDA under an Emergency Use Authorization (EUA). This EUA will remain  in effect (meaning this test can be used) for the duration of the COVID-19 declaration under Section 564(b)(1) of the Act, 21 U.S.C.section 360bbb-3(b)(1), unless the authorization is terminated  or revoked sooner.       Influenza A by PCR NEGATIVE NEGATIVE Final   Influenza B by PCR NEGATIVE NEGATIVE Final    Comment: (NOTE) The Xpert Xpress SARS-CoV-2/FLU/RSV plus assay is intended as an aid in the diagnosis of influenza from Nasopharyngeal swab specimens and should not be used as a sole basis for treatment. Nasal washings and aspirates are unacceptable for Xpert Xpress SARS-CoV-2/FLU/RSV testing.  Fact Sheet for  Patients: EntrepreneurPulse.com.au  Fact Sheet for Healthcare Providers: IncredibleEmployment.be  This test is not yet approved or cleared by the [place] FDA and has been authorized for detection and/or diagnosis of SARS-CoV-2 by FDA under an Emergency Use Authorization (EUA). This EUA will remain in effect (meaning this test can be used) for the duration of the COVID-19 declaration under Section 564(b)(1) of the Act, 21 U.S.C. section 360bbb-3(b)(1), unless the authorization is terminated or revoked.  Performed at [facility], [address] [place], [place] [address]     Coagulation Studies: Recent Labs    12/16/21 1224  LABPROT 16.7*  INR 1.4*    Urinalysis: No results for input(s): "COLORURINE", "LABSPEC", "PHURINE", "GLUCOSEU", "HGBUR", "BILIRUBINUR", "KETONESUR", "PROTEINUR", "UROBILINOGEN", "NITRITE", "LEUKOCYTESUR" in the last 72 hours.  Invalid input(s): "APPERANCEUR"    Imaging: ECHOCARDIOGRAM COMPLETE  Result Date: 12/17/2021    ECHOCARDIOGRAM REPORT   Patient Name:  Antonio Phillips Date of Exam: 12/17/2021 Medical Rec #:  [id number]          Height:       51.0 in Accession #:    [id number]         Weight:       212.0 lb Date of Birth:  [birth year]          BSA:          1.700 m Patient Age:    75 years           BP:           90/77 mmHg Patient Gender: M                  HR:           89 bpm. Exam Location:  [facility] Procedure: 2D Echo Indications:    chest pain  History:        Patient has prior history of Echocardiogram examinations, most                 recent 02/22/2021. CHF, Arrythmias:Atrial Fibrillation and                 Bradycardia; Signs/Symptoms:Shortness of Breath and Chest Pain.  Sonographer:    Harvie Junior Referring Phys: [phone] Minna Merritts  Sonographer Comments: Technically difficult study due to poor echo windows and patient is obese. Image acquisition challenging due to patient body habitus and  supine. IMPRESSIONS  1. Left ventricular ejection fraction, by estimation, is 55%. The left ventricle has normal function. The left ventricle has no regional wall motion abnormalities. There is mild left ventricular hypertrophy. Left ventricular diastolic parameters are indeterminate.  2. Right ventricular systolic function is normal. The right ventricular size is moderately enlarged. There is moderately elevated pulmonary artery systolic pressure. The estimated right ventricular systolic pressure is 70.0 mmHg.  3. Left atrial size was mild to moderately dilated.  4. Right atrial size was mildly dilated.  5. Moderate pleural effusion in the left lateral region.  6. The mitral valve is normal in structure. Mild mitral valve regurgitation. No evidence of mitral stenosis. Moderate mitral annular calcification.  7. The aortic valve is normal in structure. There is moderate calcification of the aortic valve. Aortic valve regurgitation is mild. Aortic valve sclerosis/calcification is present, with very mild aortic stenosis. Aortic valve mean gradient measures 12.5 mmHg. Aortic valve Vmax measures 2.87 m/s.  8. The inferior vena cava is normal in size with greater than 50% respiratory variability, suggesting right atrial pressure of 3 mmHg. FINDINGS  Left Ventricle: Left ventricular ejection fraction, by estimation, is 55 %. The left ventricle has normal function. The left ventricle has no regional wall motion abnormalities. The left ventricular internal cavity size was normal in size. There is mild  left ventricular hypertrophy. Left ventricular diastolic parameters are indeterminate. Right Ventricle: The right ventricular size is moderately enlarged. No increase in right ventricular wall thickness. Right ventricular systolic function is normal. There is moderately elevated pulmonary artery systolic pressure. The tricuspid regurgitant  velocity is 3.47 m/s, and with an assumed right atrial pressure of 10 mmHg, the  estimated right ventricular systolic pressure is 17.4 mmHg. Left Atrium: Left atrial size was mild to moderately dilated. Right Atrium: Right atrial size was mildly dilated. Pericardium: There is no evidence of pericardial effusion. Mitral Valve: The mitral valve is normal in structure. Moderate mitral annular calcification. Mild mitral valve regurgitation. No evidence of mitral  valve stenosis. Tricuspid Valve: The tricuspid valve is normal in structure. Tricuspid valve regurgitation is not demonstrated. No evidence of tricuspid stenosis. Aortic Valve: The aortic valve is normal in structure. There is moderate calcification of the aortic valve. Aortic valve regurgitation is mild. Aortic valve sclerosis/calcification is present, without any evidence of aortic stenosis. Aortic valve mean gradient measures 12.5 mmHg. Aortic valve peak gradient measures 25.7 mmHg. Aortic valve area, by VTI measures 2.39 cm. Pulmonic Valve: The pulmonic valve was normal in structure. Pulmonic valve regurgitation is mild. No evidence of pulmonic stenosis. Aorta: The aortic root is normal in size and structure. Venous: The inferior vena cava is normal in size with greater than 50% respiratory variability, suggesting right atrial pressure of 3 mmHg. IAS/Shunts: No atrial level shunt detected by color flow Doppler. Additional Comments: There is a moderate pleural effusion in the left lateral region.  LEFT VENTRICLE PLAX 2D LVIDd:         3.90 cm     Diastology LVIDs:         3.20 cm     LV e' medial:    8.92 cm/s LV PW:         1.20 cm     LV E/e' medial:  13.7 LV IVS:        1.20 cm     LV e' lateral:   12.60 cm/s LVOT diam:     1.90 cm     LV E/e' lateral: 9.7 LV SV:         98 LV SV Index:   57 LVOT Area:     2.84 cm  LV Volumes (MOD) LV vol d, MOD A2C: 67.8 ml LV vol d, MOD A4C: 63.4 ml LV vol s, MOD A2C: 35.1 ml LV vol s, MOD A4C: 28.4 ml LV SV MOD A2C:     32.7 ml LV SV MOD A4C:     63.4 ml LV SV MOD BP:      33.8 ml RIGHT VENTRICLE  RV Basal diam:  4.70 cm RV Mid diam:    4.30 cm RV S prime:     5.44 cm/s TAPSE (M-mode): 1.2 cm LEFT ATRIUM             Index        RIGHT ATRIUM           Index LA diam:        3.90 cm 2.29 cm/m   RA Area:     27.00 cm LA Vol (A2C):   72.1 ml 42.40 ml/m  RA Volume:   84.20 ml  49.52 ml/m LA Vol (A4C):   77.5 ml 45.58 ml/m LA Biplane Vol: 82.7 ml 48.64 ml/m  AORTIC VALVE                     PULMONIC VALVE AV Area (Vmax):    2.16 cm      PV Vmax:          1.34 m/s AV Area (Vmean):   2.27 cm      PV Peak grad:     7.2 mmHg AV Area (VTI):     2.39 cm      PR End Diast Vel: 3.96 msec AV Vmax:           253.40 cm/s AV Vmean:          163.500 cm/s AV VTI:            0.409 m AV  Peak Grad:      25.7 mmHg AV Mean Grad:      12.5 mmHg LVOT Vmax:         193.00 cm/s LVOT Vmean:        131.000 cm/s LVOT VTI:          0.344 m LVOT/AV VTI ratio: 0.84  AORTA Ao Root diam: 3.20 cm MITRAL VALVE                TRICUSPID VALVE MV Area (PHT): 4.49 cm     TR Peak grad:   48.2 mmHg MV Decel Time: 169 msec     TR Vmax:        347.00 cm/s MR Peak grad: 48.2 mmHg MR Vmax:      347.25 cm/s   SHUNTS MV E velocity: 122.00 cm/s  Systemic VTI:  0.34 m MV A velocity: 47.20 cm/s   Systemic Diam: 1.90 cm MV E/A ratio:  2.58 Ida Rogue MD Electronically signed by Ida Rogue MD Signature Date/Time: 12/17/2021/4:18:37 PM    Final      Medications:    sodium chloride     anticoagulant sodium citrate     vancomycin      artificial tears   Both Eyes BID   atorvastatin  40 mg Oral QHS   calcitRIOL  0.25 mcg Oral Daily   calcium acetate  1,334 mg Oral TID   Chlorhexidine Gluconate Cloth  6 each Topical Q0600   cyanocobalamin  1,000 mcg Oral Daily   folic acid  1 mg Oral Daily   Gerhardt's butt cream   Topical Q4H   midodrine  20 mg Oral TID WC   pregabalin  75 mg Oral QHS   rOPINIRole  1 mg Oral QHS   sertraline  50 mg Oral Daily   sevelamer carbonate  1,600 mg Oral 3 times per day on Sun Mon Wed Fri   sodium  chloride flush  3 mL Intravenous Q12H   sodium chloride, acetaminophen, alteplase, anticoagulant sodium citrate, heparin, lactulose, lidocaine (PF), lidocaine-prilocaine, ondansetron **OR** ondansetron (ZOFRAN) IV, pentafluoroprop-tetrafluoroeth, polyvinyl alcohol, sodium chloride flush  Assessment/ Plan:  Antonio Phillips is a 76 y.o.  male with past medical conditions including diabetes, hyperlipidemia, CAD, hypertension, atrial fibrillation on Eliquis, and end-stage renal disease on hemodialysis.  Patient presents to the emergency department from his dialysis clinic with complaints of chest pain/pressure, hypotension, and decreased mentation.  Patient has been admitted for Acute respiratory failure with hypercapnia ([place]) [J96.02] Acute encephalopathy [G93.40] CO2 narcosis [R06.89] ESRD on hemodialysis ([place]) [N18.6, P59.1] Acute metabolic encephalopathy [M38.46]  [facility]/TTS/left AVF  Fluid volume overload with end-stage renal disease on hemodialysis.  Chest x-ray shows pulmonary vascular congestion.  Patient received urgent dialysis At the time of admissio.  Next dialysis treatment Patient is to receive renal replacement therapy today/Saturday.  2. Anemia of chronic kidney disease Lab Results  Component Value Date   HGB 10.3 (L) 12/18/2021    Hemoglobin at goal.  Patient receives [place] outpatient.  3. Secondary Hyperparathyroidism: with outpatient labs: PTH 172, phosphorus 5.3, calcium 8.3.  Lab Results  Component Value Date   CALCIUM 9.0 12/18/2021   PHOS 4.4 12/05/2021    We will continue to monitor bone minerals during this admission.  Continue sevelamer and calcium acetate with meals and daily calcitriol.  4. 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  Patient is being followed with the primary team   Addendum Patient  was seen on dialysis today Patient tolerating treatment well   LOS:  2 Reis Pienta s Cambree Hendrix 10/14/20233:55 PM

## 2021-12-18 NOTE — ED Notes (Signed)
Randol Kern, NP and floor RN state ok to go with pt to floor. RN transporting now.

## 2021-12-18 NOTE — Progress Notes (Signed)
Unable to complete patient profile. Patient is lethargic and on bipap.

## 2021-12-18 NOTE — Progress Notes (Signed)
Cardiology Progress Note   Patient Name: Antonio Phillips Date of Encounter: 12/18/2021  Primary Cardiologist: Ida Rogue, MD  Subjective   Lethargic. Denies c/p or dyspnea.  Inpatient Medications    Scheduled Meds:  artificial tears   Both Eyes BID   atorvastatin  40 mg Oral QHS   calcitRIOL  0.25 mcg Oral Daily   calcium acetate  1,334 mg Oral TID   Chlorhexidine Gluconate Cloth  6 each Topical Q0600   clopidogrel  75 mg Oral Q breakfast   cyanocobalamin  1,000 mcg Oral Daily   folic acid  1 mg Oral Daily   Gerhardt's butt cream   Topical Q4H   midodrine  20 mg Oral TID WC   pregabalin  75 mg Oral QHS   rOPINIRole  1 mg Oral QHS   sertraline  50 mg Oral Daily   sevelamer carbonate  1,600 mg Oral 3 times per day on Sun Mon Wed Fri   sodium chloride flush  3 mL Intravenous Q12H   Continuous Infusions:  sodium chloride     albumin human Stopped (12/18/21 0114)   anticoagulant sodium citrate     vancomycin     PRN Meds: sodium chloride, acetaminophen, alteplase, anticoagulant sodium citrate, heparin, lactulose, lidocaine (PF), lidocaine-prilocaine, ondansetron **OR** ondansetron (ZOFRAN) IV, pentafluoroprop-tetrafluoroeth, polyvinyl alcohol, sodium chloride flush   Vital Signs    Vitals:   12/18/21 0328 12/18/21 0804 12/18/21 1111 12/18/21 1118  BP:  (!) 83/56 90/61   Pulse:  67  (!) 50  Resp:  17  17  Temp:  98.1 F (36.7 C)  98.5 F (36.9 C)  TempSrc:  Oral    SpO2:  98%  99%  Weight: 95.8 kg     Height:        Intake/Output Summary (Last 24 hours) at 12/18/2021 1122 Last data filed at 12/18/2021 1011 Gross per 24 hour  Intake --  Output 0 ml  Net 0 ml   Filed Weights   12/16/21 1238 12/18/21 0328  Weight: 96.2 kg 95.8 kg    Physical Exam   GEN: Obese, lethargic, in no acute distress.  HEENT: Grossly normal.  Neck: Supple, no JVD, carotid bruits, or masses. Cardiac: Irregularly irregular, 2/6 systolic murmur heard throughout, no rubs  or gallops.  He was seen no clubbing, cyanosis, edema.  Left AKA, right BKA.   Respiratory:  Respirations regular and unlabored, diminished breath sounds bilaterally.  Poor effort. GI: Obese, soft, nontender, nondistended, BS + x 4. MS: no deformity or atrophy. Skin: warm and dry, no rash. Neuro:  Strength and sensation are intact. Psych: Disoriented to time and place.  Flat affect.  Labs    Chemistry Recent Labs  Lab 12/16/21 1224 12/17/21 0501 12/18/21 0721  NA 141 141 138  K 3.7 3.6 4.0  CL 103 101 101  CO2 28 28 27   GLUCOSE 101* 72 78  BUN 31* 19 28*  CREATININE 3.89* 2.80* 3.59*  CALCIUM 9.5 9.0 9.0  PROT 6.1*  --   --   ALBUMIN 3.4*  --   --   AST 16  --   --   ALT 7  --   --   ALKPHOS 70  --   --   BILITOT 1.3*  --   --   GFRNONAA 15* 23* 17*  ANIONGAP 10 12 10      Hematology Recent Labs  Lab 12/16/21 1224 12/17/21 0501 12/18/21 0721  WBC 6.7 4.7 4.2  RBC  3.83* 3.27* 3.22*  HGB 12.0* 10.4* 10.3*  HCT 42.1 36.3* 35.6*  MCV 109.9* 111.0* 110.6*  MCH 31.3 31.8 32.0  MCHC 28.5* 28.7* 28.9*  RDW 20.6* 20.5* 20.2*  PLT 80* 63* 20*    Cardiac Enzymes  Recent Labs  Lab 12/16/21 1224 12/17/21 0500 12/17/21 0501 12/18/21 0920  TROPONINIHS 66* 75* 60* 77*      BNP    Component Value Date/Time   BNP 1,932.3 (H) 12/04/2021 1054    Lipids  Lab Results  Component Value Date   CHOL 58 09/23/2020   HDL 23 (L) 09/23/2020   LDLCALC 22 09/23/2020   TRIG 63 09/23/2020   CHOLHDL 2.5 09/23/2020    HbA1c  Lab Results  Component Value Date   HGBA1C 5.5 11/10/2018    Radiology    CT Head Wo Contrast  Result Date: 12/16/2021 CLINICAL DATA:  Altered mental status EXAM: CT HEAD WITHOUT CONTRAST TECHNIQUE: Contiguous axial images were obtained from the base of the skull through the vertex without intravenous contrast. RADIATION DOSE REDUCTION: This exam was performed according to the departmental dose-optimization program which includes automated  exposure control, adjustment of the mA and/or kV according to patient size and/or use of iterative reconstruction technique. COMPARISON:  CT T bone 12/04/21 FINDINGS: Brain: No evidence of acute infarction, hemorrhage, hydrocephalus, extra-axial collection or mass lesion/mass effect. There is mineralization of the basal ganglia bilaterally. Advanced generalized volume loss. Vascular: No hyperdense vessel or unexpected calcification. Skull: Normal. Negative for fracture or focal lesion. Sinuses/Orbits: Bilateral lens replacements. Large bilateral mastoid effusions with thinning of the tegmen tympani on the left. There is fluid in the middle ear cavity bilaterally. Other: None. IMPRESSION: 1. No acute intracranial abnormality. 2. Large bilateral mastoid effusions with thinning of the tegmen tympani and likely erosion through the roof of the mastoid air cells on the left. Consider further evaluation with a contrast enhanced temporal bone CT if the patient has symptoms of otomastoiditis. Electronically Signed   By: Marin Roberts M.D.   On: 12/16/2021 13:33   DG Chest Port 1 View  Result Date: 12/16/2021 CLINICAL DATA:  Questionable sepsis - evaluate for abnormality. EXAM: PORTABLE CHEST 1 VIEW COMPARISON:  Chest radiograph 12/06/2021 FINDINGS: The cardiac silhouette remains enlarged. There is increased pulmonary vascular congestion. There is a moderate right pleural effusion which has increased in size. There may be a small left pleural effusion. Increased airspace opacities are present in the left greater than right lung bases. No pneumothorax is identified. No acute osseous abnormality is seen. IMPRESSION: Cardiomegaly with increased pulmonary vascular congestion, enlarging moderate right and possible small left pleural effusions, and bibasilar atelectasis or consolidation. Electronically Signed   By: Logan Bores M.D.   On: 12/16/2021 13:10    Telemetry    Atrial fibrillation, 70s, PVCs- Personally  Reviewed  Cardiac Studies   2D Echocardiogram 10.13.2023  1. Left ventricular ejection fraction, by estimation, is 55%. The left  ventricle has normal function. The left ventricle has no regional wall  motion abnormalities. There is mild left ventricular hypertrophy. Left  ventricular diastolic parameters are  indeterminate.   2. Right ventricular systolic function is normal. The right ventricular  size is moderately enlarged. There is moderately elevated pulmonary artery  systolic pressure. The estimated right ventricular systolic pressure is  44.9 mmHg.   3. Left atrial size was mild to moderately dilated.   4. Right atrial size was mildly dilated.   5. Moderate pleural effusion in the left lateral  region.   6. The mitral valve is normal in structure. Mild mitral valve  regurgitation. No evidence of mitral stenosis. Moderate mitral annular  calcification.   7. The aortic valve is normal in structure. There is moderate  calcification of the aortic valve. Aortic valve regurgitation is mild.  Aortic valve sclerosis/calcification is present, with very mild aortic  stenosis. Aortic valve mean gradient measures  12.5 mmHg. Aortic valve Vmax measures 2.87 m/s.   8. The inferior vena cava is normal in size with greater than 50%  respiratory variability, suggesting right atrial pressure of 3 mmHg.  Patient Profile     76 y.o. 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  Assessment & Plan    1.  Acute encephalopathy: Patient with a history of encephalopathy, presented  from hemodialysis in October 12 secondary to altered mental status and finding of hypercapnia and respiratory acidosis on arrival to the emergency department.  Initially required BiPAP.  Lethargic this morning.  Disoriented to time and place but cooperative.  Management per primary team.  2.  Coronary artery disease/supply demand ischemia: Status post PCI and drug-eluting stent placement to the OM 2 in July 2022.  Otherwise moderate, nonobstructive disease at that time.  Apparently was complaining of chest pain a hemodialysis though was less clear about this on presentation.  Troponins mildly elevated (66  75  60  77).  Echo on October 13 showed an EF of 55% without regional wall motion abnormalities.  Denies chest pain or dyspnea this morning.  Plan for conservative management.  He is on chronic statin and Plavix with chronic and worsening thrombocytopenia with platelets of 20,000.  We will hold Plavix.  On beta-blocker at home but held secondary to hypotension, which is ongoing.  3.  Bradycardia: Patient reportedly bradycardic in the ED however on our evaluation, it appears that the telemetry monitors were not accurately picking up his small complexes.  He has been in A-fib in the 70s to 14s.  Beta-blockers currently on hold however at this point, it is secondary to hypotension.  4.  Chronic hypotension: Pressures continue to trend soft off of home dose of beta-blocker.  Continue midodrine.  5.  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  RVSP 58.2 mmHg yesterday.  Body habitus makes exam very challenging.  Volume management per nephrology/hemodialysis.  Of note, he is status post thoracentesis on October 2 (700 mL).  He is on no medications related to his heart failure secondary to hypotension.  6.  Permanent atrial fibrillation: Rate  controlled off of home dose of beta-blocker at this time.  He is not on anticoagulation secondary to chronic thrombocytopenia.  7.  Thrombocytopenia: Platelets down further to 20,000 this morning.  We will hold Plavix.  8.  End-stage renal disease: Hemodialysis per nephrology.  Signed, Murray Hodgkins, NP  12/18/2021, 11:22 AM    For questions or updates, please contact   Please consult [email or web address] for contact info under Cardiology/STEMI.

## 2021-12-18 NOTE — Progress Notes (Signed)
Pt HD started w/ uf off. Sbp<90. Albumin started w/ start of HD. Will continue to monitor.

## 2021-12-18 NOTE — Progress Notes (Signed)
PT did 3.5 hrs of HD, bp low we couldn't reach target UF  UF = 474ml  Report given to floor RN Access patent, has bruit and thrill    12/18/21 1830  Vitals  Temp 97.6 F (36.4 C)  Temp Source Oral  BP 97/68  MAP (mmHg) 76  BP Location Right Arm  BP Method Automatic  Patient Position (if appropriate) Lying  Pulse Rate 98  Pulse Rate Source Monitor  ECG Heart Rate 86  Resp 15

## 2021-12-18 NOTE — ED Notes (Signed)
Ointment applied to pt buttocks. Pt boosted in bed and HOB elevated. Pillow placed under pt R hip to promote comfort and offloading of pressure from buttocks. Pt brief clean and dry. Pt alert at this time. Monitor and [place] remain in use. Bed low and locked with side rails raised x3. Call bell in reach. Pt denies any needs at this time.

## 2021-12-18 NOTE — Progress Notes (Signed)
Transport delay for patient to HD.

## 2021-12-19 DIAGNOSIS — I739 Peripheral vascular disease, unspecified: Secondary | ICD-10-CM

## 2021-12-19 DIAGNOSIS — R7989 Other specified abnormal findings of blood chemistry: Secondary | ICD-10-CM

## 2021-12-19 DIAGNOSIS — I959 Hypotension, unspecified: Secondary | ICD-10-CM | POA: Diagnosis not present

## 2021-12-19 DIAGNOSIS — J9602 Acute respiratory failure with hypercapnia: Secondary | ICD-10-CM | POA: Diagnosis not present

## 2021-12-19 DIAGNOSIS — G9341 Metabolic encephalopathy: Secondary | ICD-10-CM | POA: Diagnosis not present

## 2021-12-19 DIAGNOSIS — R001 Bradycardia, unspecified: Secondary | ICD-10-CM

## 2021-12-19 DIAGNOSIS — J9621 Acute and chronic respiratory failure with hypoxia: Secondary | ICD-10-CM | POA: Diagnosis not present

## 2021-12-19 DIAGNOSIS — L8941 Pressure ulcer of contiguous site of back, buttock and hip, stage 1: Secondary | ICD-10-CM

## 2021-12-19 LAB — CULTURE, BLOOD (ROUTINE X 2)

## 2021-12-19 NOTE — Hospital Course (Addendum)
76 year old male with medical history for ESRD on hemodialysis Tuesday Thursday Saturday, history of chronic hypoxic respiratory failure on 2 L of oxygen via nasal cannula, CAD s/p PCI with stent angioplasty 7/22, history of permanent A-fib not on anticoagulation, 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 He resides at [facility].  10/12: Patient was found to be in uncompensated respiratory stenosis with hypercapnia and was placed on BiPAP. Fluid overloaded d/t acute on chronic CHF. Underwent HD.  10/13: Seen by cardiology - advised continue HD. Repeat Echo. Refusing BiPap. Remains on  O2 at 3L. Difficult HD today d/t hypotension. Needing albumin for BP support. Palliative care consulted. No CPR/intubation. BCx from 10/12 (+)staph epidermidis methicillin resistance. Started on vancomycin 10/13 10/14: cardio s/o.  10/15: repeat BCx ordered. S/S back on staph bacteremia. Continue vanc for now. Consult ID 10/16: lethargic this AM requiring to be placed back on BiPap, improved on this. ID to see today, continue vancomycin pending any further ID recs. 10/17: BCx resulted this afternoon negative, can continue vancomycin outpatient pending confirmation w/ ID, TOC to confirm abx and BiPap equipment   10/18: BiPap and abx plan arranged, patient stable for discharge      Consultants:  Nephrology  Cardiology  Palliative care  Infectious disease   Procedures: Hemodialysis   Antibiotics: Vancomycin 12/17/2021 - present, finish outpatient per med rec      ASSESSMENT & PLAN:   Principal Problem:   Acute metabolic encephalopathy Active Problems:   Hypotension   Sinus bradycardia   Acute on chronic respiratory failure with hypoxia and hypercapnia (HCC)   Acute on chronic combined systolic and diastolic CHF (congestive heart failure) (HCC)    ESRD on hemodialysis (HCC)   Pressure injury of skin   Thrombocytopenia (HCC)   PVD (peripheral vascular disease) ([place])   Permanent atrial fibrillation (HCC)   Morbid obesity (HCC)   Elevated troponin   Acute respiratory failure with hypercapnia (HCC)   CO2 narcosis   Acute metabolic encephalopathy - resolved Secondary to uncompensated respiratory acidosis with hypercapnia Mental status had improved but waxes/wanes, improved w/ BiPap   Acute on chronic respiratory failure with hypoxia and hypercapnia Uncompensated respiratory acidosis on ABG Likely d/t fluid overload acute on chronic HFrEF  Weaned to baseline O2 2-3L/min Has been needing BiPap qhs - will need this outpatient    Bacteremia vs BCx contaminant - 2 cultures (+)Staph Epidermidis Started 10/13 on vancomycin  final culture result --> (S) Vancomycin  ID consult --> Likely contaminant. Repeat blood culture sent 10/15 --> negative -->  will DC vanco after completion of 7 days    Hypotension - stable, no symptoms  Patient has been on midodrine 10 mg 3 times daily --> Dose of midodrine changed to 20 mg p.o. 3 times daily Held home po antihypertensives  Check vitals daily    Acute on chronic systolic CHF / HFrEF - exacerbation resovled Chest x-ray showed bilateral pleural effusions with pulmonary vascular congestion LVEF 40 to 45% as per echo from December 2020  Continue renal replacement therapy Net IO Since Admission: -3,179 mL [12/21/21 1740]   ESRD - stable ESRD and is on hemodialysis Tuesday Thursday and Saturday history of noncompliance with dialysis Nephrology following    Pressure injury of the skin present on admission - stable Patient has unstageable pressure ulcers over the gluteal region  Wound care    Thrombocytopenia - stable Continue NO anticoagulation for Afib, no Plavix for PAD Repeat CBC periodically  Close monitor for bleeding, low threshold for transfusion   Mild elevation of troponin Likely  due to demand ischemia in the setting of acute on chronic systolic CHF Echocardiogram --> LVEF 55, no RWMA, mild LVH, indeterminate diastolic fxn    Permanent atrial fibrillation Metoprolol on hold due to hypotension Not on long-term anticoagulation, continue to hold due to thrombocytopenia with increased risk of bleeding Heart rate had fairly well controlled, less than 100, occasionally higher Remains on telemetry   PVD S/p right BKA and left AKA Continue atorvastatin Plavix on hold due to thrombocytopenia

## 2021-12-19 NOTE — Progress Notes (Addendum)
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 He resides at [facility].  10/12: Patient was found to be in uncompensated respiratory stenosis with hypercapnia and was placed on BiPAP. Fluid overloaded d/t acute on chronic CHF. Underwent HD.  10/13: Seen by cardiology - advised continue HD. Repeat Echo. Refusing BiPap. Remains on [place] O2 at 3L. Difficult HD today d/t hypotension. Needing albumin for BP support. Palliative care consulted. No CPR/intubation. BCx from 10/12 (+)staph epidermidis methicillin resistance. Started on vancomycin 10/13 10/14: cardio s/o.  10/15: repeat BCx ordered. S/S back on staph bacteremia   Consultants:  Nephrology  Cardiology  Palliative care   Procedures: Hemodialysis       ASSESSMENT & PLAN:   Principal Problem:   Acute metabolic encephalopathy Active Problems:   Hypotension   Sinus bradycardia   Acute on chronic respiratory failure with hypoxia and hypercapnia (HCC)   Acute on chronic combined systolic and diastolic CHF (congestive heart failure) (HCC)   ESRD on hemodialysis (HCC)   Pressure injury of skin   Thrombocytopenia (HCC)   PVD (peripheral vascular disease) (HCC)   Permanent atrial fibrillation (HCC)   Morbid obesity (HCC)   Elevated troponin   Acute respiratory failure with hypercapnia (HCC)   CO2 narcosis   Acute metabolic encephalopathy -  resolved Secondary to uncompensated respiratory acidosis with hypercapnia Mental status has significantly improved   Acute on chronic respiratory failure with hypoxia and hypercapnia Uncompensated respiratory acidosis on ABG Likely d/t fluid overload acute on chronic HFrEF  At baseline he wears 2 L of oxygen continuously Started on BiPAP in the ED BiPAP has been turned off now as patient could not tolerate it, placed back on oxygen via nasal cannula 3 L/min Wean to baseline O2 as tolerated   Bacteremia vs BCx contaminant - 2 cultures (+)Staph Epidermidis Blood cultures growing Staph epidermidis could be contamination, however patient was hypotensive 2 BCx positive. Staph w/ methicillin resistance, also resistant to clindamycin, erythromycin, oxacillin Started on vancomycin per pharmacy consultation await final culture result --> (S) Vancomycin  Repeat BCx ordered today 12/19/21.   Hypotension Systolic blood pressure has been hanging around in 80s Patient has been on midodrine 10 mg 3 times daily --> Dose of midodrine changed to 20 mg p.o. 3 times daily Patient also received normal saline bolus to 50 cc x 1 for hypotension two days ago.   Acute on chronic systolic CHF / HFrEF Chest x-ray showed bilateral pleural effusions with pulmonary vascular congestion LVEF 40 to 45% as per echo from December 2020  Continue renal replacement therapy Net IO Since Admission: -1,179 mL [12/19/21 0818]    ESRD Patient has history of ESRD and is on hemodialysis Tuesday Thursday and Saturday history of noncompliance with dialysis Nephrology following    Pressure injury of the skin present on admission Patient has unstageable pressure ulcers over the gluteal region Wound care consulted   Thrombocytopenia Chronic  Continue NO anticoagulation for Afib, no Plavix for PAD   Mild elevation of troponin Likely due to demand ischemia in the setting of acute on chronic systolic CHF Echocardiogram  ordered; will follow results   Permanent atrial fibrillation Metoprolol on hold due to bradycardia and hypotension Not on long-term anticoagulation due to thrombocytopenia with increased risk of bleeding Heart rate fairly well controlled, less than 100   PVD S/p right BKA and left AKA Continue atorvastatin Plavix on hold due to  thrombocytopenia    DVT prophylaxis: no Rx ppx d/t low Plt, no SCD d/t BKA bilaterally  Pertinent IV fluids/nutrition: no continuous IV fluids  Central lines / invasive devices: none  Code Status: DNR Family Communication: none (no NOK)  Disposition: inpatient TOC needs: back to ALF Barriers to discharge / significant pending items: blood culture susceptibility resulted today (+)staph epidermidis (S)Vanc which he has been getting, may need longer term IV abx pending repeat BCx                Subjective:  Patient reports feels fine, no CP/SOB. He is very HoH and communication is difficult. He appears to understand but also he often states "sure" in response to any question.        Objective:  Vitals:   12/19/21 0018 12/19/21 0352 12/19/21 0756 12/19/21 1143  BP: (!) 87/54 (!) 94/57 102/62 104/60  Pulse: 89 81 84 82  Resp: 18 18  17   Temp: 98.4 F (36.9 C) 98.5 F (36.9 C) 98.3 F (36.8 C) 99.1 F (37.3 C)  TempSrc: Oral  Oral Oral  SpO2: 100% 93% 95% 90%  Weight:      Height:        Intake/Output Summary (Last 24 hours) at 12/19/2021 1646 Last data filed at 12/19/2021 5329 Gross per 24 hour  Intake 200 ml  Output 400 ml  Net -200 ml   Filed Weights   12/16/21 1238 12/18/21 0328  Weight: 96.2 kg 95.8 kg    Examination:  Constitutional:  VS as above General Appearance: alert, well-developed, well-nourished, NAD Eyes: Normal lids and conjunctive, non-icteric sclera Neck: No masses, trachea midline Respiratory: Normal respiratory effort No wheeze No rhonchi No rales Cardiovascular: S1/S2 normal +  murmur Irreg/irreg Normal rate  No rub/gallop auscultated Gastrointestinal: No tenderness Musculoskeletal:  No clubbing/cyanosis of digits Symmetrical movement in all extremities Neurological: No cranial nerve deficit on limited exam Alert Very hard of hearing  Psychiatric: Fair judgment/insight Flat mood and affect       Scheduled Medications:   artificial tears   Both Eyes BID   atorvastatin  40 mg Oral QHS   calcitRIOL  0.25 mcg Oral Daily   calcium acetate  1,334 mg Oral TID   Chlorhexidine Gluconate Cloth  6 each Topical Q0600   cyanocobalamin  1,000 mcg Oral Daily   folic acid  1 mg Oral Daily   Gerhardt's butt cream   Topical Q4H   midodrine  20 mg Oral TID WC   pregabalin  75 mg Oral QHS   rOPINIRole  1 mg Oral QHS   sertraline  50 mg Oral Daily   sevelamer carbonate  1,600 mg Oral 3 times per day on Sun Mon Wed Fri   sodium chloride flush  3 mL Intravenous Q12H    Continuous Infusions:  sodium chloride     anticoagulant sodium citrate     vancomycin Stopped (12/18/21 2159)    PRN Medications:  sodium chloride, acetaminophen, alteplase, anticoagulant sodium citrate,  heparin, lactulose, lidocaine (PF), lidocaine-prilocaine, ondansetron **OR** ondansetron (ZOFRAN) IV, pentafluoroprop-tetrafluoroeth, polyvinyl alcohol, sodium chloride flush  Antimicrobials:  Anti-infectives (From admission, onward)    Start     Dose/Rate Route Frequency Ordered Stop   12/18/21 2000  vancomycin (VANCOCIN) IVPB 1000 mg/200 mL premix        1,000 mg 200 mL/hr over 60 Minutes Intravenous Every T-Th-Sa (Hemodialysis) 12/17/21 1739     12/17/21 1830  vancomycin (VANCOREADY) IVPB 2000 mg/400 mL        2,000 mg 200 mL/hr over 120 Minutes Intravenous  Once 12/17/21 1738 12/17/21 2015       Data Reviewed: I have personally reviewed following labs and imaging studies  CBC: Recent Labs  Lab 12/16/21 1224 12/17/21 0501 12/18/21 0721  WBC 6.7 4.7 4.2  NEUTROABS 3.9  --    --   HGB 12.0* 10.4* 10.3*  HCT 42.1 36.3* 35.6*  MCV 109.9* 111.0* 110.6*  PLT 80* 63* 20*   Basic Metabolic Panel: Recent Labs  Lab 12/16/21 1224 12/17/21 0501 12/18/21 0721  NA 141 141 138  K 3.7 3.6 4.0  CL 103 101 101  CO2 28 28 27   GLUCOSE 101* 72 78  BUN 31* 19 28*  CREATININE 3.89* 2.80* 3.59*  CALCIUM 9.5 9.0 9.0   GFR: Estimated Creatinine Clearance: 13.6 mL/min (A) (by C-G formula based on SCr of 3.59 mg/dL (H)). Liver Function Tests: Recent Labs  Lab 12/16/21 1224  AST 16  ALT 7  ALKPHOS 70  BILITOT 1.3*  PROT 6.1*  ALBUMIN 3.4*   No results for input(s): "LIPASE", "AMYLASE" in the last 168 hours. Recent Labs  Lab 12/16/21 1224  AMMONIA 36*   Coagulation Profile: Recent Labs  Lab 12/16/21 1224  INR 1.4*   Cardiac Enzymes: No results for input(s): "CKTOTAL", "CKMB", "CKMBINDEX", "TROPONINI" in the last 168 hours. BNP (last 3 results) No results for input(s): "PROBNP" in the last 8760 hours. HbA1C: No results for input(s): "HGBA1C" in the last 72 hours. CBG: Recent Labs  Lab 12/17/21 0113 12/18/21 0250  GLUCAP 80 89   Lipid Profile: No results for input(s): "CHOL", "HDL", "LDLCALC", "TRIG", "CHOLHDL", "LDLDIRECT" in the last 72 hours. Thyroid Function Tests: Recent Labs    12/17/21 0501  TSH 6.049*   Anemia Panel: No results for input(s): "VITAMINB12", "FOLATE", "FERRITIN", "TIBC", "IRON", "RETICCTPCT" in the last 72 hours. Urine analysis:    Component Value Date/Time   COLORURINE RED (A) 12/04/2021 1316   APPEARANCEUR TURBID (A) 12/04/2021 1316   APPEARANCEUR Hazy 11/14/2012 2044   LABSPEC 1.012 12/04/2021 1316   LABSPEC 1.016 11/14/2012 2044   PHURINE  12/04/2021 1316    TEST NOT REPORTED DUE TO COLOR INTERFERENCE OF URINE PIGMENT   GLUCOSEU (A) 12/04/2021 1316    TEST NOT REPORTED DUE TO COLOR INTERFERENCE OF URINE PIGMENT   GLUCOSEU 150 mg/dL 11/14/2012 2044   HGBUR (A) 12/04/2021 1316    TEST NOT REPORTED DUE TO COLOR  INTERFERENCE OF URINE PIGMENT   BILIRUBINUR (A) 12/04/2021 1316    TEST NOT REPORTED DUE TO COLOR INTERFERENCE OF URINE PIGMENT   BILIRUBINUR Negative 11/14/2012 2044   KETONESUR (A) 12/04/2021 1316    TEST NOT REPORTED DUE TO COLOR INTERFERENCE OF URINE PIGMENT   PROTEINUR (A) 12/04/2021 1316    TEST NOT REPORTED DUE TO COLOR INTERFERENCE OF URINE PIGMENT   NITRITE (A) 12/04/2021 1316    TEST NOT REPORTED DUE TO COLOR INTERFERENCE OF URINE PIGMENT   LEUKOCYTESUR (A) 12/04/2021  1316    TEST NOT REPORTED DUE TO COLOR INTERFERENCE OF URINE PIGMENT   LEUKOCYTESUR Trace 11/14/2012 2044   Sepsis Labs: @LABRCNTIP (procalcitonin:4,lacticidven:4)  Recent Results (from the past 240 hour(s))  Blood Culture (routine x 2)     Status: Abnormal   Collection Time: 12/16/21 12:24 PM   Specimen: BLOOD  Result Value Ref Range Status   Specimen Description   Final    BLOOD RIGHT [facility] Performed at [facility], [address], [place], [place] [address]    Special Requests   Final    BOTTLES DRAWN AEROBIC AND ANAEROBIC Blood Culture results may not be optimal due to an inadequate volume of blood received in culture bottles Performed at [facility], [address], [place], [place] [address]    Culture  Setup Time   Final    GRAM POSITIVE COCCI IN BOTH AEROBIC AND ANAEROBIC BOTTLES CRITICAL VALUE NOTED.  VALUE IS CONSISTENT WITH PREVIOUSLY REPORTED AND CALLED VALUE. Performed at [facility], [address] [place], [place] [address]    Culture (A)  Final    STAPHYLOCOCCUS EPIDERMIDIS SUSCEPTIBILITIES PERFORMED ON PREVIOUS CULTURE WITHIN THE LAST 5 DAYS. Performed at [facility], [place] [address], [place], [place] [address]    Report Status 12/19/2021 FINAL  Final  Blood Culture (routine x 2)     Status: Abnormal   Collection Time: 12/16/21 12:24 PM   Specimen: BLOOD  Result Value Ref Range Status   Specimen Description   Final    BLOOD BLOOD RIGHT  FOREARM Performed at [facility], [place] [address], [place], [place] [address]    Special Requests   Final    BOTTLES DRAWN AEROBIC AND ANAEROBIC Blood Culture results may not be optimal due to an inadequate volume of blood received in culture bottles Performed at [facility], [address] [place], [place] [address]    Culture  Setup Time   Final    GRAM POSITIVE COCCI IN BOTH AEROBIC AND ANAEROBIC BOTTLES Organism ID to follow CRITICAL RESULT CALLED TO, READ BACK BY AND VERIFIED WITH: JASON ROBBINS@0626  12/17/21 RH Performed at [facility], [address] [place], [place] [address]    Culture STAPHYLOCOCCUS EPIDERMIDIS (A)  Final   Report Status 12/19/2021 FINAL  Final   Organism ID, Bacteria STAPHYLOCOCCUS EPIDERMIDIS  Final      Susceptibility   Staphylococcus epidermidis - MIC*    CIPROFLOXACIN <=0.5 SENSITIVE Sensitive     ERYTHROMYCIN >=8 RESISTANT Resistant     GENTAMICIN <=0.5 SENSITIVE Sensitive     OXACILLIN >=4 RESISTANT Resistant     TETRACYCLINE <=1 SENSITIVE Sensitive     VANCOMYCIN 1 SENSITIVE Sensitive     TRIMETH/SULFA <=10 SENSITIVE Sensitive     CLINDAMYCIN >=8 RESISTANT Resistant     RIFAMPIN <=0.5 SENSITIVE Sensitive     Inducible Clindamycin NEGATIVE Sensitive     * STAPHYLOCOCCUS EPIDERMIDIS  Blood Culture ID Panel (Reflexed)     Status: Abnormal   Collection Time: 12/16/21 12:24 PM  Result Value Ref Range Status   Enterococcus faecalis NOT DETECTED NOT DETECTED Final   Enterococcus Faecium NOT DETECTED NOT DETECTED Final   Listeria monocytogenes NOT DETECTED NOT DETECTED Final   Staphylococcus species DETECTED (A) NOT DETECTED Final    Comment: CRITICAL RESULT CALLED TO, READ BACK BY AND VERIFIED WITH: JASON ROBBINS@0626  12/17/21 RH    Staphylococcus aureus (BCID) NOT DETECTED NOT DETECTED Final   Staphylococcus epidermidis DETECTED (A) NOT  DETECTED Final    Comment: Methicillin (oxacillin) resistant coagulase negative  staphylococcus. Possible blood culture contaminant (unless isolated from more than one blood culture draw or clinical case suggests pathogenicity). No antibiotic treatment is indicated for blood  culture contaminants. CRITICAL RESULT CALLED TO, READ BACK BY AND VERIFIED WITH: JASON ROBBINS@0626  12/17/21 RH    Staphylococcus lugdunensis NOT DETECTED NOT DETECTED Final   Streptococcus species NOT DETECTED NOT DETECTED Final   Streptococcus agalactiae NOT DETECTED NOT DETECTED Final   Streptococcus pneumoniae NOT DETECTED NOT DETECTED Final   Streptococcus pyogenes NOT DETECTED NOT DETECTED Final   A.calcoaceticus-baumannii NOT DETECTED NOT DETECTED Final   Bacteroides fragilis NOT DETECTED NOT DETECTED Final   Enterobacterales NOT DETECTED NOT DETECTED Final   Enterobacter cloacae complex NOT DETECTED NOT DETECTED Final   Escherichia coli NOT DETECTED NOT DETECTED Final   Klebsiella aerogenes NOT DETECTED NOT DETECTED Final   Klebsiella oxytoca NOT DETECTED NOT DETECTED Final   Klebsiella pneumoniae NOT DETECTED NOT DETECTED Final   Proteus species NOT DETECTED NOT DETECTED Final   Salmonella species NOT DETECTED NOT DETECTED Final   Serratia marcescens NOT DETECTED NOT DETECTED Final   Haemophilus influenzae NOT DETECTED NOT DETECTED Final   Neisseria meningitidis NOT DETECTED NOT DETECTED Final   Pseudomonas aeruginosa NOT DETECTED NOT DETECTED Final   Stenotrophomonas maltophilia NOT DETECTED NOT DETECTED Final   Candida albicans NOT DETECTED NOT DETECTED Final   Candida auris NOT DETECTED NOT DETECTED Final   Candida glabrata NOT DETECTED NOT DETECTED Final   Candida krusei NOT DETECTED NOT DETECTED Final   Candida parapsilosis NOT DETECTED NOT DETECTED Final   Candida tropicalis NOT DETECTED NOT DETECTED Final   Cryptococcus neoformans/gattii NOT DETECTED NOT DETECTED Final   Methicillin resistance mecA/C DETECTED (A) NOT DETECTED Final    Comment: CRITICAL RESULT CALLED TO, READ  BACK BY AND VERIFIED WITH: JASON ROBBINS@0626  12/17/21 RH Performed at [facility], [address] [place], [place] [address]   Resp Panel by RT-PCR (Flu A&B, Covid) Anterior Nasal Swab     Status: None   Collection Time: 12/16/21  1:10 PM   Specimen: Anterior Nasal Swab  Result Value Ref Range Status   SARS Coronavirus 2 by RT PCR NEGATIVE NEGATIVE Final    Comment: (NOTE) SARS-CoV-2 target nucleic acids are NOT DETECTED.  The SARS-CoV-2 RNA is generally detectable in upper respiratory specimens during the acute phase of infection. The lowest concentration of SARS-CoV-2 viral copies this assay can detect is 138 copies/mL. A negative result does not preclude SARS-Cov-2 infection and should not be used as the sole basis for treatment or other patient management decisions. A negative result may occur with  improper specimen collection/handling, submission of specimen other than nasopharyngeal swab, presence of viral mutation(s) within the areas targeted by this assay, and inadequate number of viral copies(<138 copies/mL). A negative result must be combined with clinical observations, patient history, and epidemiological information. The expected result is Negative.  Fact Sheet for Patients:  EntrepreneurPulse.com.au  Fact Sheet for Healthcare Providers:  IncredibleEmployment.be  This test is no t yet approved or cleared by the [place] FDA and  has been authorized for detection and/or diagnosis of SARS-CoV-2 by FDA under an Emergency Use Authorization (EUA). This EUA will remain  in effect (meaning this test can be used) for the duration of the COVID-19 declaration under Section 564(b)(1) of the Act, 21 U.S.C.section 360bbb-3(b)(1), unless the authorization is terminated  or revoked sooner.  Influenza A by PCR NEGATIVE NEGATIVE Final   Influenza B by PCR NEGATIVE NEGATIVE Final    Comment: (NOTE) The Xpert Xpress  SARS-CoV-2/FLU/RSV plus assay is intended as an aid in the diagnosis of influenza from Nasopharyngeal swab specimens and should not be used as a sole basis for treatment. Nasal washings and aspirates are unacceptable for Xpert Xpress SARS-CoV-2/FLU/RSV testing.  Fact Sheet for Patients: EntrepreneurPulse.com.au  Fact Sheet for Healthcare Providers: IncredibleEmployment.be  This test is not yet approved or cleared by the [place] FDA and has been authorized for detection and/or diagnosis of SARS-CoV-2 by FDA under an Emergency Use Authorization (EUA). This EUA will remain in effect (meaning this test can be used) for the duration of the COVID-19 declaration under Section 564(b)(1) of the Act, 21 U.S.C. section 360bbb-3(b)(1), unless the authorization is terminated or revoked.  Performed at [facility], [address] [place], [place] [address]          Radiology Studies: ECHOCARDIOGRAM COMPLETE  Result Date: 12/17/2021    ECHOCARDIOGRAM REPORT   Patient Name:   Antonio Phillips [facility] Date of Exam: 12/17/2021 Medical Rec #:  [id number]          Height:       51.0 in Accession #:    [id number]         Weight:       212.0 lb Date of Birth:  [birth year]          BSA:          1.700 m Patient Age:    38 years           BP:           90/77 mmHg Patient Gender: M                  HR:           89 bpm. Exam Location:  [facility] Procedure: 2D Echo Indications:    chest pain  History:        Patient has prior history of Echocardiogram examinations, most                 recent 02/22/2021. CHF, Arrythmias:Atrial Fibrillation and                 Bradycardia; Signs/Symptoms:Shortness of Breath and Chest Pain.  Sonographer:    Harvie Junior Referring Phys: [phone] Minna Merritts  Sonographer Comments: Technically difficult study due to poor echo windows and patient is obese. Image acquisition challenging due to patient body habitus and supine. IMPRESSIONS  1.  Left ventricular ejection fraction, by estimation, is 55%. The left ventricle has normal function. The left ventricle has no regional wall motion abnormalities. There is mild left ventricular hypertrophy. Left ventricular diastolic parameters are indeterminate.  2. Right ventricular systolic function is normal. The right ventricular size is moderately enlarged. There is moderately elevated pulmonary artery systolic pressure. The estimated right ventricular systolic pressure is 86.5 mmHg.  3. Left atrial size was mild to moderately dilated.  4. Right atrial size was mildly dilated.  5. Moderate pleural effusion in the left lateral region.  6. The mitral valve is normal in structure. Mild mitral valve regurgitation. No evidence of mitral stenosis. Moderate mitral annular calcification.  7. The aortic valve is normal in structure. There is moderate calcification of the aortic valve. Aortic valve regurgitation is mild. Aortic valve sclerosis/calcification is present, with very mild aortic stenosis. Aortic valve mean gradient measures 12.5  mmHg. Aortic valve Vmax measures 2.87 m/s.  8. The inferior vena cava is normal in size with greater than 50% respiratory variability, suggesting right atrial pressure of 3 mmHg. FINDINGS  Left Ventricle: Left ventricular ejection fraction, by estimation, is 55 %. The left ventricle has normal function. The left ventricle has no regional wall motion abnormalities. The left ventricular internal cavity size was normal in size. There is mild  left ventricular hypertrophy. Left ventricular diastolic parameters are indeterminate. Right Ventricle: The right ventricular size is moderately enlarged. No increase in right ventricular wall thickness. Right ventricular systolic function is normal. There is moderately elevated pulmonary artery systolic pressure. The tricuspid regurgitant  velocity is 3.47 m/s, and with an assumed right atrial pressure of 10 mmHg, the estimated right ventricular  systolic pressure is 81.1 mmHg. Left Atrium: Left atrial size was mild to moderately dilated. Right Atrium: Right atrial size was mildly dilated. Pericardium: There is no evidence of pericardial effusion. Mitral Valve: The mitral valve is normal in structure. Moderate mitral annular calcification. Mild mitral valve regurgitation. No evidence of mitral valve stenosis. Tricuspid Valve: The tricuspid valve is normal in structure. Tricuspid valve regurgitation is not demonstrated. No evidence of tricuspid stenosis. Aortic Valve: The aortic valve is normal in structure. There is moderate calcification of the aortic valve. Aortic valve regurgitation is mild. Aortic valve sclerosis/calcification is present, without any evidence of aortic stenosis. Aortic valve mean gradient measures 12.5 mmHg. Aortic valve peak gradient measures 25.7 mmHg. Aortic valve area, by VTI measures 2.39 cm. Pulmonic Valve: The pulmonic valve was normal in structure. Pulmonic valve regurgitation is mild. No evidence of pulmonic stenosis. Aorta: The aortic root is normal in size and structure. Venous: The inferior vena cava is normal in size with greater than 50% respiratory variability, suggesting right atrial pressure of 3 mmHg. IAS/Shunts: No atrial level shunt detected by color flow Doppler. Additional Comments: There is a moderate pleural effusion in the left lateral region.  LEFT VENTRICLE PLAX 2D LVIDd:         3.90 cm     Diastology LVIDs:         3.20 cm     LV e' medial:    8.92 cm/s LV PW:         1.20 cm     LV E/e' medial:  13.7 LV IVS:        1.20 cm     LV e' lateral:   12.60 cm/s LVOT diam:     1.90 cm     LV E/e' lateral: 9.7 LV SV:         98 LV SV Index:   57 LVOT Area:     2.84 cm  LV Volumes (MOD) LV vol d, MOD A2C: 67.8 ml LV vol d, MOD A4C: 63.4 ml LV vol s, MOD A2C: 35.1 ml LV vol s, MOD A4C: 28.4 ml LV SV MOD A2C:     32.7 ml LV SV MOD A4C:     63.4 ml LV SV MOD BP:      33.8 ml RIGHT VENTRICLE RV Basal diam:  4.70 cm RV  Mid diam:    4.30 cm RV S prime:     5.44 cm/s TAPSE (M-mode): 1.2 cm LEFT ATRIUM             Index        RIGHT ATRIUM           Index LA diam:  3.90 cm 2.29 cm/m   RA Area:     27.00 cm LA Vol (A2C):   72.1 ml 42.40 ml/m  RA Volume:   84.20 ml  49.52 ml/m LA Vol (A4C):   77.5 ml 45.58 ml/m LA Biplane Vol: 82.7 ml 48.64 ml/m  AORTIC VALVE                     PULMONIC VALVE AV Area (Vmax):    2.16 cm      PV Vmax:          1.34 m/s AV Area (Vmean):   2.27 cm      PV Peak grad:     7.2 mmHg AV Area (VTI):     2.39 cm      PR End Diast Vel: 3.96 msec AV Vmax:           253.40 cm/s AV Vmean:          163.500 cm/s AV VTI:            0.409 m AV Peak Grad:      25.7 mmHg AV Mean Grad:      12.5 mmHg LVOT Vmax:         193.00 cm/s LVOT Vmean:        131.000 cm/s LVOT VTI:          0.344 m LVOT/AV VTI ratio: 0.84  AORTA Ao Root diam: 3.20 cm MITRAL VALVE                TRICUSPID VALVE MV Area (PHT): 4.49 cm     TR Peak grad:   48.2 mmHg MV Decel Time: 169 msec     TR Vmax:        347.00 cm/s MR Peak grad: 48.2 mmHg MR Vmax:      347.25 cm/s   SHUNTS MV E velocity: 122.00 cm/s  Systemic VTI:  0.34 m MV A velocity: 47.20 cm/s   Systemic Diam: 1.90 cm MV E/A ratio:  2.58 Ida Rogue MD Electronically signed by Ida Rogue MD Signature Date/Time: 12/17/2021/4:18:37 PM    Final             LOS: 3 days      Emeterio Reeve, DO [facility] 12/19/2021, 4:46 PM   Staff may message me via secure chat in [place]  but this may not receive immediate response,  please page for urgent matters!  If 7PM-7AM, please contact night-coverage [email or web address]  Dictation software was used to generate the above note. Typos may occur and escape review, as with typed/written notes. Please contact Dr Sheppard Coil directly for clarity if needed.

## 2021-12-19 NOTE — Progress Notes (Signed)
[facility]  ROUNDING NOTE   Subjective:   Antonio Phillips is a 76 year old male with past medical conditions including diabetes, hyperlipidemia, CAD, hypertension, atrial fibrillation on Eliquis, and end-stage renal disease on hemodialysis.  Patient presents to the emergency department from his dialysis clinic with complaints of chest pain/pressure, hypotension, and decreased mentation.  Patient has been admitted for Acute respiratory failure with hypercapnia ([place]) [J96.02] Acute encephalopathy [G93.40] CO2 narcosis [R06.89] ESRD on hemodialysis ([place]) [N18.6, H84.6] Acute metabolic encephalopathy [N62.95]  Patient is known to our practice and receives outpatient dialysis treatments at [facility] on a TTS schedule, supervised by Dr. Candiss Norse.  Patient resting comfortably in the bed Patient offers no new specific physical complaints  Objective:  Vital signs in last 24 hours:  Temp:  [97.6 F (36.4 C)-98.5 F (36.9 C)] 98.3 F (36.8 C) (10/15 0756) Pulse Rate:  [50-98] 84 (10/15 0756) Resp:  [9-18] 18 (10/15 0352) BP: (87-102)/(54-71) 102/62 (10/15 0756) SpO2:  [93 %-100 %] 95 % (10/15 0756)  Weight change:  Filed Weights   12/16/21 1238 12/18/21 0328  Weight: 96.2 kg 95.8 kg    Intake/Output: I/O last 3 completed shifts: In: 21 [I.V.:3; IV Piggyback:18] Out: 400 [Other:400]   Intake/Output this shift:  Total I/O In: 200 [IV Piggyback:200] Out: -   Physical Exam: General:  NAD  Head: Normocephalic, atraumatic.   Eyes: Anicteric  Lungs:  Fine basilar crackles, normal effort, [place] O2  Heart: Irregular rate and rhythm  Abdomen:  Soft, nontender, obese  Extremities: 1+ peripheral edema.  Neurologic: Nonfocal, moving all four extremities  Skin: No lesions  Access: Left upper AVF    Basic Metabolic Panel: Recent Labs  Lab 12/16/21 1224 12/17/21 0501 12/18/21 0721  NA 141 141 138  K 3.7 3.6 4.0  CL 103 101 101  CO2 28 28 27   GLUCOSE  101* 72 78  BUN 31* 19 28*  CREATININE 3.89* 2.80* 3.59*  CALCIUM 9.5 9.0 9.0    Liver Function Tests: Recent Labs  Lab 12/16/21 1224  AST 16  ALT 7  ALKPHOS 70  BILITOT 1.3*  PROT 6.1*  ALBUMIN 3.4*   No results for input(s): "LIPASE", "AMYLASE" in the last 168 hours. Recent Labs  Lab 12/16/21 1224  AMMONIA 36*    CBC: Recent Labs  Lab 12/16/21 1224 12/17/21 0501 12/18/21 0721  WBC 6.7 4.7 4.2  NEUTROABS 3.9  --   --   HGB 12.0* 10.4* 10.3*  HCT 42.1 36.3* 35.6*  MCV 109.9* 111.0* 110.6*  PLT 80* 63* 20*    Cardiac Enzymes: No results for input(s): "CKTOTAL", "CKMB", "CKMBINDEX", "TROPONINI" in the last 168 hours.  BNP: Invalid input(s): "POCBNP"  CBG: Recent Labs  Lab 12/17/21 0113 12/18/21 0250  GLUCAP 80 9    Microbiology: Results for orders placed or performed during the hospital encounter of 12/16/21  Blood Culture (routine x 2)     Status: Abnormal (Preliminary result)   Collection Time: 12/16/21 12:24 PM   Specimen: BLOOD  Result Value Ref Range Status   Specimen Description   Final    BLOOD RIGHT [facility] Performed at [facility], [address], [place], [place] [address]    Special Requests   Final    BOTTLES DRAWN AEROBIC AND ANAEROBIC Blood Culture results may not be optimal due to an inadequate volume of blood received in culture bottles Performed at [facility] [place], [address], [place], [place] [address]    Culture  Setup Time   Final    GRAM POSITIVE COCCI IN BOTH AEROBIC AND ANAEROBIC BOTTLES CRITICAL VALUE NOTED.  VALUE IS CONSISTENT WITH PREVIOUSLY REPORTED AND CALLED VALUE. Performed at [facility], [address] [place], [place] [address]    Culture STAPHYLOCOCCUS EPIDERMIDIS (A)  Final   Report Status PENDING  Incomplete  Blood Culture (routine x 2)     Status: Abnormal (Preliminary result)   Collection Time: 12/16/21 12:24 PM   Specimen: BLOOD  Result Value Ref Range Status   Specimen  Description   Final    BLOOD BLOOD RIGHT FOREARM Performed at [facility], [place] [address] [place], [place] [address]    Special Requests   Final    BOTTLES DRAWN AEROBIC AND ANAEROBIC Blood Culture results may not be optimal due to an inadequate volume of blood received in culture bottles Performed at [facility], [address] [place], [place] [address]    Culture  Setup Time   Final    GRAM POSITIVE COCCI IN BOTH AEROBIC AND ANAEROBIC BOTTLES Organism ID to follow CRITICAL RESULT CALLED TO, READ BACK BY AND VERIFIED WITH: JASON ROBBINS@[email or web address]  12/17/21 RH Performed at [facility], [address] [place], [place] [address]    Culture (A)  Final    STAPHYLOCOCCUS EPIDERMIDIS SUSCEPTIBILITIES TO FOLLOW Performed at [facility], [place] [address] [place], [place] [address]    Report Status PENDING  Incomplete  Blood Culture ID Panel (Reflexed)     Status: Abnormal   Collection Time: 12/16/21 12:24 PM  Result Value Ref Range Status   Enterococcus faecalis NOT DETECTED NOT DETECTED Final   Enterococcus Faecium NOT DETECTED NOT DETECTED Final   Listeria monocytogenes NOT DETECTED NOT DETECTED Final   Staphylococcus species DETECTED (A) NOT DETECTED Final    Comment: CRITICAL RESULT CALLED TO, READ BACK BY AND VERIFIED WITH: JASON ROBBINS@0626  12/17/21 RH    Staphylococcus aureus (BCID) NOT DETECTED NOT DETECTED Final   Staphylococcus epidermidis DETECTED (A) NOT DETECTED Final    Comment: Methicillin (oxacillin) resistant coagulase negative staphylococcus. Possible blood culture contaminant (unless isolated from more than one blood culture draw or clinical case suggests pathogenicity). No antibiotic treatment is indicated for blood  culture contaminants. CRITICAL RESULT CALLED TO, READ BACK BY AND VERIFIED WITH: JASON ROBBINS@0626  12/17/21 RH    Staphylococcus lugdunensis NOT DETECTED NOT DETECTED Final   Streptococcus species NOT DETECTED NOT  DETECTED Final   Streptococcus agalactiae NOT DETECTED NOT DETECTED Final   Streptococcus pneumoniae NOT DETECTED NOT DETECTED Final   Streptococcus pyogenes NOT DETECTED NOT DETECTED Final   A.calcoaceticus-baumannii NOT DETECTED NOT DETECTED Final   Bacteroides fragilis NOT DETECTED NOT DETECTED Final   Enterobacterales NOT DETECTED NOT DETECTED Final   Enterobacter cloacae complex NOT DETECTED NOT DETECTED Final   Escherichia coli NOT DETECTED NOT DETECTED Final   Klebsiella aerogenes NOT DETECTED NOT DETECTED Final   Klebsiella oxytoca NOT DETECTED NOT DETECTED Final   Klebsiella pneumoniae NOT DETECTED NOT DETECTED Final   Proteus species NOT DETECTED NOT DETECTED Final   Salmonella species NOT DETECTED NOT DETECTED Final   Serratia marcescens NOT DETECTED NOT DETECTED Final   Haemophilus influenzae NOT DETECTED NOT DETECTED Final   Neisseria meningitidis NOT DETECTED NOT DETECTED Final   Pseudomonas aeruginosa NOT DETECTED NOT DETECTED Final   Stenotrophomonas maltophilia NOT DETECTED NOT DETECTED Final   Candida albicans NOT DETECTED NOT DETECTED Final   Candida auris NOT DETECTED NOT DETECTED  Final   Candida glabrata NOT DETECTED NOT DETECTED Final   Candida krusei NOT DETECTED NOT DETECTED Final   Candida parapsilosis NOT DETECTED NOT DETECTED Final   Candida tropicalis NOT DETECTED NOT DETECTED Final   Cryptococcus neoformans/gattii NOT DETECTED NOT DETECTED Final   Methicillin resistance mecA/C DETECTED (A) NOT DETECTED Final    Comment: CRITICAL RESULT CALLED TO, READ BACK BY AND VERIFIED WITH: JASON ROBBINS@0626  12/17/21 RH Performed at [facility], [address], [place], [place] [address]   Resp Panel by RT-PCR (Flu A&B, Covid) Anterior Nasal Swab     Status: None   Collection Time: 12/16/21  1:10 PM   Specimen: Anterior Nasal Swab  Result Value Ref Range Status   SARS Coronavirus 2 by RT PCR NEGATIVE NEGATIVE Final    Comment: (NOTE) SARS-CoV-2 target  nucleic acids are NOT DETECTED.  The SARS-CoV-2 RNA is generally detectable in upper respiratory specimens during the acute phase of infection. The lowest concentration of SARS-CoV-2 viral copies this assay can detect is 138 copies/mL. A negative result does not preclude SARS-Cov-2 infection and should not be used as the sole basis for treatment or other patient management decisions. A negative result may occur with  improper specimen collection/handling, submission of specimen other than nasopharyngeal swab, presence of viral mutation(s) within the areas targeted by this assay, and inadequate number of viral copies(<138 copies/mL). A negative result must be combined with clinical observations, patient history, and epidemiological information. The expected result is Negative.  Fact Sheet for Patients:  EntrepreneurPulse.com.au  Fact Sheet for Healthcare Providers:  IncredibleEmployment.be  This test is no t yet approved or cleared by the [place] FDA and  has been authorized for detection and/or diagnosis of SARS-CoV-2 by FDA under an Emergency Use Authorization (EUA). This EUA will remain  in effect (meaning this test can be used) for the duration of the COVID-19 declaration under Section 564(b)(1) of the Act, 21 U.S.C.section 360bbb-3(b)(1), unless the authorization is terminated  or revoked sooner.       Influenza A by PCR NEGATIVE NEGATIVE Final   Influenza B by PCR NEGATIVE NEGATIVE Final    Comment: (NOTE) The Xpert Xpress SARS-CoV-2/FLU/RSV plus assay is intended as an aid in the diagnosis of influenza from Nasopharyngeal swab specimens and should not be used as a sole basis for treatment. Nasal washings and aspirates are unacceptable for Xpert Xpress SARS-CoV-2/FLU/RSV testing.  Fact Sheet for Patients: EntrepreneurPulse.com.au  Fact Sheet for Healthcare  Providers: IncredibleEmployment.be  This test is not yet approved or cleared by the [place] FDA and has been authorized for detection and/or diagnosis of SARS-CoV-2 by FDA under an Emergency Use Authorization (EUA). This EUA will remain in effect (meaning this test can be used) for the duration of the COVID-19 declaration under Section 564(b)(1) of the Act, 21 U.S.C. section 360bbb-3(b)(1), unless the authorization is terminated or revoked.  Performed at [facility], [address] [place], [place] [address]     Coagulation Studies: Recent Labs    12/16/21 1224  LABPROT 16.7*  INR 1.4*    Urinalysis: No results for input(s): "COLORURINE", "LABSPEC", "PHURINE", "GLUCOSEU", "HGBUR", "BILIRUBINUR", "KETONESUR", "PROTEINUR", "UROBILINOGEN", "NITRITE", "LEUKOCYTESUR" in the last 72 hours.  Invalid input(s): "APPERANCEUR"    Imaging: ECHOCARDIOGRAM COMPLETE  Result Date: 12/17/2021    ECHOCARDIOGRAM REPORT   Patient Name:   Antonio Phillips [facility] Date of Exam: 12/17/2021 Medical Rec #:  [id number]          Height:  51.0 in Accession #:    [id number]         Weight:       212.0 lb Date of Birth:  1945/12/06          BSA:          1.700 m Patient Age:    75 years           BP:           90/77 mmHg Patient Gender: M                  HR:           89 bpm. Exam Location:  [facility] Procedure: 2D Echo Indications:    chest pain  History:        Patient has prior history of Echocardiogram examinations, most                 recent 02/22/2021. CHF, Arrythmias:Atrial Fibrillation and                 Bradycardia; Signs/Symptoms:Shortness of Breath and Chest Pain.  Sonographer:    Harvie Junior Referring Phys: [phone] Minna Merritts  Sonographer Comments: Technically difficult study due to poor echo windows and patient is obese. Image acquisition challenging due to patient body habitus and supine. IMPRESSIONS  1. Left ventricular ejection fraction, by estimation, is 55%.  The left ventricle has normal function. The left ventricle has no regional wall motion abnormalities. There is mild left ventricular hypertrophy. Left ventricular diastolic parameters are indeterminate.  2. Right ventricular systolic function is normal. The right ventricular size is moderately enlarged. There is moderately elevated pulmonary artery systolic pressure. The estimated right ventricular systolic pressure is 61.4 mmHg.  3. Left atrial size was mild to moderately dilated.  4. Right atrial size was mildly dilated.  5. Moderate pleural effusion in the left lateral region.  6. The mitral valve is normal in structure. Mild mitral valve regurgitation. No evidence of mitral stenosis. Moderate mitral annular calcification.  7. The aortic valve is normal in structure. There is moderate calcification of the aortic valve. Aortic valve regurgitation is mild. Aortic valve sclerosis/calcification is present, with very mild aortic stenosis. Aortic valve mean gradient measures 12.5 mmHg. Aortic valve Vmax measures 2.87 m/s.  8. The inferior vena cava is normal in size with greater than 50% respiratory variability, suggesting right atrial pressure of 3 mmHg. FINDINGS  Left Ventricle: Left ventricular ejection fraction, by estimation, is 55 %. The left ventricle has normal function. The left ventricle has no regional wall motion abnormalities. The left ventricular internal cavity size was normal in size. There is mild  left ventricular hypertrophy. Left ventricular diastolic parameters are indeterminate. Right Ventricle: The right ventricular size is moderately enlarged. No increase in right ventricular wall thickness. Right ventricular systolic function is normal. There is moderately elevated pulmonary artery systolic pressure. The tricuspid regurgitant  velocity is 3.47 m/s, and with an assumed right atrial pressure of 10 mmHg, the estimated right ventricular systolic pressure is 43.1 mmHg. Left Atrium: Left atrial size  was mild to moderately dilated. Right Atrium: Right atrial size was mildly dilated. Pericardium: There is no evidence of pericardial effusion. Mitral Valve: The mitral valve is normal in structure. Moderate mitral annular calcification. Mild mitral valve regurgitation. No evidence of mitral valve stenosis. Tricuspid Valve: The tricuspid valve is normal in structure. Tricuspid valve regurgitation is not demonstrated. No evidence of tricuspid stenosis. Aortic Valve: The aortic valve is  normal in structure. There is moderate calcification of the aortic valve. Aortic valve regurgitation is mild. Aortic valve sclerosis/calcification is present, without any evidence of aortic stenosis. Aortic valve mean gradient measures 12.5 mmHg. Aortic valve peak gradient measures 25.7 mmHg. Aortic valve area, by VTI measures 2.39 cm. Pulmonic Valve: The pulmonic valve was normal in structure. Pulmonic valve regurgitation is mild. No evidence of pulmonic stenosis. Aorta: The aortic root is normal in size and structure. Venous: The inferior vena cava is normal in size with greater than 50% respiratory variability, suggesting right atrial pressure of 3 mmHg. IAS/Shunts: No atrial level shunt detected by color flow Doppler. Additional Comments: There is a moderate pleural effusion in the left lateral region.  LEFT VENTRICLE PLAX 2D LVIDd:         3.90 cm     Diastology LVIDs:         3.20 cm     LV e' medial:    8.92 cm/s LV PW:         1.20 cm     LV E/e' medial:  13.7 LV IVS:        1.20 cm     LV e' lateral:   12.60 cm/s LVOT diam:     1.90 cm     LV E/e' lateral: 9.7 LV SV:         98 LV SV Index:   57 LVOT Area:     2.84 cm  LV Volumes (MOD) LV vol d, MOD A2C: 67.8 ml LV vol d, MOD A4C: 63.4 ml LV vol s, MOD A2C: 35.1 ml LV vol s, MOD A4C: 28.4 ml LV SV MOD A2C:     32.7 ml LV SV MOD A4C:     63.4 ml LV SV MOD BP:      33.8 ml RIGHT VENTRICLE RV Basal diam:  4.70 cm RV Mid diam:    4.30 cm RV S prime:     5.44 cm/s TAPSE (M-mode):  1.2 cm LEFT ATRIUM             Index        RIGHT ATRIUM           Index LA diam:        3.90 cm 2.29 cm/m   RA Area:     27.00 cm LA Vol (A2C):   72.1 ml 42.40 ml/m  RA Volume:   84.20 ml  49.52 ml/m LA Vol (A4C):   77.5 ml 45.58 ml/m LA Biplane Vol: 82.7 ml 48.64 ml/m  AORTIC VALVE                     PULMONIC VALVE AV Area (Vmax):    2.16 cm      PV Vmax:          1.34 m/s AV Area (Vmean):   2.27 cm      PV Peak grad:     7.2 mmHg AV Area (VTI):     2.39 cm      PR End Diast Vel: 3.96 msec AV Vmax:           253.40 cm/s AV Vmean:          163.500 cm/s AV VTI:            0.409 m AV Peak Grad:      25.7 mmHg AV Mean Grad:      12.5 mmHg LVOT Vmax:  193.00 cm/s LVOT Vmean:        131.000 cm/s LVOT VTI:          0.344 m LVOT/AV VTI ratio: 0.84  AORTA Ao Root diam: 3.20 cm MITRAL VALVE                TRICUSPID VALVE MV Area (PHT): 4.49 cm     TR Peak grad:   48.2 mmHg MV Decel Time: 169 msec     TR Vmax:        347.00 cm/s MR Peak grad: 48.2 mmHg MR Vmax:      347.25 cm/s   SHUNTS MV E velocity: 122.00 cm/s  Systemic VTI:  0.34 m MV A velocity: 47.20 cm/s   Systemic Diam: 1.90 cm MV E/A ratio:  2.58 Ida Rogue MD Electronically signed by Ida Rogue MD Signature Date/Time: 12/17/2021/4:18:37 PM    Final      Medications:    sodium chloride     anticoagulant sodium citrate     vancomycin Stopped (12/18/21 2159)    artificial tears   Both Eyes BID   atorvastatin  40 mg Oral QHS   calcitRIOL  0.25 mcg Oral Daily   calcium acetate  1,334 mg Oral TID   Chlorhexidine Gluconate Cloth  6 each Topical Q0600   cyanocobalamin  1,000 mcg Oral Daily   folic acid  1 mg Oral Daily   Gerhardt's butt cream   Topical Q4H   midodrine  20 mg Oral TID WC   pregabalin  75 mg Oral QHS   rOPINIRole  1 mg Oral QHS   sertraline  50 mg Oral Daily   sevelamer carbonate  1,600 mg Oral 3 times per day on Sun Mon Wed Fri   sodium chloride flush  3 mL Intravenous Q12H   sodium chloride, acetaminophen,  alteplase, anticoagulant sodium citrate, heparin, lactulose, lidocaine (PF), lidocaine-prilocaine, ondansetron **OR** ondansetron (ZOFRAN) IV, pentafluoroprop-tetrafluoroeth, polyvinyl alcohol, sodium chloride flush  Assessment/ Plan:  [name] is a 76 y.o.  male with past medical conditions including diabetes, hyperlipidemia, CAD, hypertension, atrial fibrillation on Eliquis, and end-stage renal disease on hemodialysis.  Patient presents to the emergency department from his dialysis clinic with complaints of chest pain/pressure, hypotension, and decreased mentation.  Patient has been admitted for Acute respiratory failure with hypercapnia ([place]) [J96.02] Acute encephalopathy [G93.40] CO2 narcosis [R06.89] ESRD on hemodialysis ([place]) [N18.6, Z99.3] Acute metabolic encephalopathy [T70.17]  [facility]/TTS/left AVF  Fluid volume overload with end-stage renal disease on hemodialysis.  Chest x-ray shows pulmonary vascular congestion.  Patient received urgent dialysis At the time of admissio.  Patient was last dialyzed yesterday-Saturday.No need for renal placement therapy today  2. Anemia of chronic kidney disease Lab Results  Component Value Date   HGB 10.3 (L) 12/18/2021    Hemoglobin at goal.  Patient receives [place] outpatient.  3. Secondary Hyperparathyroidism: with outpatient labs: PTH 172, phosphorus 5.3, calcium 8.3.  Lab Results  Component Value Date   CALCIUM 9.0 12/18/2021   PHOS 4.4 12/05/2021    We will continue to monitor bone minerals during this admission.  Continue sevelamer and calcium acetate with meals and daily calcitriol.  4. 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  Patient is being followed with the primary team     LOS: 3 Alexza Norbeck s [facility] 10/15/20239:15 AM

## 2021-12-20 DIAGNOSIS — G9341 Metabolic encephalopathy: Secondary | ICD-10-CM | POA: Diagnosis not present

## 2021-12-20 DIAGNOSIS — B957 Other staphylococcus as the cause of diseases classified elsewhere: Secondary | ICD-10-CM

## 2021-12-20 DIAGNOSIS — J9602 Acute respiratory failure with hypercapnia: Secondary | ICD-10-CM | POA: Diagnosis not present

## 2021-12-20 DIAGNOSIS — I5043 Acute on chronic combined systolic (congestive) and diastolic (congestive) heart failure: Secondary | ICD-10-CM | POA: Diagnosis not present

## 2021-12-20 DIAGNOSIS — R7881 Bacteremia: Secondary | ICD-10-CM

## 2021-12-20 DIAGNOSIS — J9621 Acute and chronic respiratory failure with hypoxia: Secondary | ICD-10-CM | POA: Diagnosis not present

## 2021-12-20 DIAGNOSIS — I4821 Permanent atrial fibrillation: Secondary | ICD-10-CM | POA: Diagnosis not present

## 2021-12-20 DIAGNOSIS — I959 Hypotension, unspecified: Secondary | ICD-10-CM | POA: Diagnosis not present

## 2021-12-20 LAB — CBC
HCT: 37.4 % — ABNORMAL LOW (ref 39.0–52.0)
Hemoglobin: 10.9 g/dL — ABNORMAL LOW (ref 13.0–17.0)
MCH: 31.8 pg (ref 26.0–34.0)
MCHC: 29.1 g/dL — ABNORMAL LOW (ref 30.0–36.0)
MCV: 109 fL — ABNORMAL HIGH (ref 80.0–100.0)
Platelets: 53 10*3/uL — ABNORMAL LOW (ref 150–400)
RBC: 3.43 MIL/uL — ABNORMAL LOW (ref 4.22–5.81)
RDW: 20.3 % — ABNORMAL HIGH (ref 11.5–15.5)
WBC: 6.1 10*3/uL (ref 4.0–10.5)
nRBC: 0.3 % — ABNORMAL HIGH (ref 0.0–0.2)

## 2021-12-20 LAB — GLUCOSE, CAPILLARY: Glucose-Capillary: 108 mg/dL — ABNORMAL HIGH (ref 70–99)

## 2021-12-20 LAB — BASIC METABOLIC PANEL
Anion gap: 8 (ref 5–15)
BUN: 29 mg/dL — ABNORMAL HIGH (ref 8–23)
CO2: 28 mmol/L (ref 22–32)
Calcium: 9.2 mg/dL (ref 8.9–10.3)
Chloride: 102 mmol/L (ref 98–111)
Creatinine, Ser: 3.68 mg/dL — ABNORMAL HIGH (ref 0.61–1.24)
GFR, Estimated: 16 mL/min — ABNORMAL LOW (ref >60)
Glucose, Bld: 94 mg/dL (ref 70–99)
Potassium: 3.9 mmol/L (ref 3.5–5.1)
Sodium: 138 mmol/L (ref 135–145)

## 2021-12-20 NOTE — NC FL2 (Signed)
[place] LEVEL OF CARE SCREENING TOOL     IDENTIFICATION  Patient Name: Antonio Phillips Birthdate: 1945-12-24 Sex: male Admission Date (Current Location): 12/16/2021  [facility] and [place] Number:  Engineering geologist and Address:  [facility], [address], [place], [place] [address]      Provider Number: [id number]  Attending Physician Name and Address:  Emeterio Reeve, DO  Relative Name and Phone Number:       Current Level of Care: Hospital Recommended Level of Care: [place] Prior Approval Number:    Date Approved/Denied:   PASRR Number: [id number]  Discharge Plan: SNF    Current Diagnoses: Patient Active Problem List   Diagnosis Date Noted   Acute respiratory failure with hypercapnia ([place]) 12/17/2021   CO2 narcosis 12/17/2021   Sinus bradycardia 12/16/2021   Acute on chronic respiratory failure with hypoxia and hypercapnia ([place]) 12/16/2021   Acute on chronic combined systolic and diastolic CHF (congestive heart failure) ([place]) 12/16/2021   Elevated troponin 12/16/2021   Anasarca 12/05/2021   Acute encephalopathy 12/04/2021   Pleural effusion on right 12/04/2021   Hematuria 12/04/2021   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) ([place]) 12/04/2021   Severe sepsis ([place]) 03/03/2021   Abscess of right thigh 03/03/2021   Longstanding persistent atrial fibrillation ([place])    Acute metabolic encephalopathy 95/63/8756   Pain    Normocytic anemia    Atrial fibrillation with rapid ventricular response (HCC)    Preop cardiovascular exam    Abdominal pain 01/31/2021   Choledocholithiasis 01/31/2021   Morbid obesity ([place])    Sepsis due to Escherichia coli ([place])    Wound of skin    Hypotension    Demand ischemia    Permanent atrial fibrillation ([place])    Chest pain 09/26/2020   PVD (peripheral vascular disease) ()    Shortness of breath 09/20/2020   Fungal skin infection    Atrial  fibrillation, chronic (HCC)    Acquired thrombophilia (HCC)    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 MASD on bilateral buttocks: No dressing.)                       Personal Care Assistance Level of Assistance              Functional Limitations Info  Sight, Hearing, Speech Sight Info: Adequate Hearing Info: Adequate Speech Info: Impaired (Slurred/Dysarthria)    SPECIAL CARE FACTORS FREQUENCY                       Contractures Contractures Info: Not present    Additional Factors Info  Code Status, Allergies, Isolation Precautions Code Status Info: DNR Allergies Info: Simvastatin     Isolation Precautions Info: Contact: VRE, MRSA     Current Medications (12/20/2021):  This  is the current hospital active medication list Current Facility-Administered Medications  Medication Dose Route Frequency Provider Last Rate Last Admin   acetaminophen (TYLENOL) tablet 650 mg  650 mg Oral Q6H PRN Agbata, Tochukwu, MD   650 mg at 12/19/21 2111   alteplase (CATHFLO ACTIVASE) injection 2 mg  2 mg Intracatheter Once PRN Colon Flattery, NP       anticoagulant sodium citrate solution 5 mL  5 mL Intracatheter PRN Colon Flattery, NP       artificial tears (LACRILUBE) ophthalmic ointment   Both Eyes BID Lorna Dibble, RPH   Given at 12/20/21 0948   atorvastatin (LIPITOR) tablet 40  mg  40 mg Oral QHS Agbata, Tochukwu, MD   40 mg at 12/19/21 2111   calcitRIOL (ROCALTROL) capsule 0.25 mcg  0.25 mcg Oral Daily Agbata, Tochukwu, MD   0.25 mcg at 12/20/21 0948   calcium acetate (PHOSLO) capsule 1,334 mg  1,334 mg Oral TID Collier Bullock, MD   1,334 mg at 12/20/21 0947   Chlorhexidine Gluconate Cloth 2 % PADS 6 each  6 each Topical Q0600 Colon Flattery, NP   6 each at 12/18/21 0501   cyanocobalamin (VITAMIN B12) tablet 1,000 mcg  1,000 mcg Oral Daily Agbata, Tochukwu, MD   1,000 mcg at 25/05/39 7673   folic acid (FOLVITE) tablet 1 mg  1 mg Oral Daily Agbata, Tochukwu, MD   1 mg at 12/20/21 4193   Gerhardt's butt cream   Topical Q4H Oswald Hillock, MD   Given at 12/20/21 0403   heparin injection 1,000 Units  1,000 Units Intracatheter PRN Colon Flattery, NP       lactulose (CHRONULAC) 10 GM/15ML solution 10 g  10 g Oral BID PRN Agbata, Tochukwu, MD   10 g at 12/19/21 0753   lidocaine (PF) (XYLOCAINE) 1 % injection 5 mL  5 mL Intradermal PRN Colon Flattery, NP       lidocaine-prilocaine (EMLA) cream 1 Application  1 Application Topical PRN Colon Flattery, NP       midodrine (PROAMATINE) tablet 20 mg  20 mg Oral TID WC Oswald Hillock, MD   20 mg at 12/20/21 0947   ondansetron (ZOFRAN) tablet 4 mg  4 mg Oral Q6H PRN Agbata, Tochukwu, MD       Or   ondansetron (ZOFRAN) injection 4 mg  4 mg Intravenous Q6H PRN Agbata, Tochukwu, MD       pentafluoroprop-tetrafluoroeth (GEBAUERS) aerosol 1 Application  1 Application Topical PRN Breeze, Benancio Deeds, NP       polyvinyl alcohol (LIQUIFILM TEARS) 1.4 % ophthalmic solution 1 drop  1 drop Both Eyes TID PRN Agbata, Tochukwu, MD       pregabalin (LYRICA) capsule 75 mg  75 mg Oral QHS Agbata, Tochukwu, MD   75 mg at 12/19/21 2111   rOPINIRole (REQUIP) tablet 1 mg  1 mg Oral QHS Agbata, Tochukwu, MD   1 mg at 12/19/21 2111   sertraline (ZOLOFT) tablet 50 mg  50 mg Oral Daily Agbata, Tochukwu, MD   50 mg at 12/20/21 0947   sevelamer  carbonate (RENVELA) tablet 1,600 mg  1,600 mg Oral 3 times per day on Sun Mon Wed Fri Agbata, Tochukwu, MD   1,600 mg at 12/19/21 1724   vancomycin (VANCOCIN) IVPB 1000 mg/200 mL premix  1,000 mg Intravenous Q T,Th,Sa-HD Wynelle [place], [place] at 12/18/21 2159     Discharge Medications: Please see discharge summary for a list of discharge medications.  Relevant Imaging Results:  Relevant Lab Results:   Additional Information SS#: [id number]. HD [facility] TTS 11:40 am.  Candie Chroman, LCSW

## 2021-12-20 NOTE — TOC Initial Note (Signed)
Transition of Care [facility]) - Initial/Assessment Note    Patient Details  Name: Antonio Phillips MRN: [id number] Date of Birth: [birth year]  [facility]) CM/SW Contact:    Candie Chroman, LCSW Phone Number: 12/20/2021, 3:58 PM  Clinical Narrative:   Patient only oriented to self. CSW is familiar with patient from last admission. Patient is a long-term resident at [facility]. CSW will follow for return to SNF when medically stable.               Expected Discharge Plan: Skilled Nursing Facility Barriers to Discharge: Continued Medical Work up   Patient Goals and CMS Choice   CMS Medicare.gov Compare Post Acute Care list provided to::  (N/A) Choice offered to / list presented to : NA  Expected Discharge Plan and Services Expected Discharge Plan: [place] Acute Care Choice: Resumption of Svcs/PTA Provider Living arrangements for the past 2 months: [place]                                      Prior Living Arrangements/Services Living arrangements for the past 2 months: [place] Lives with:: Facility Resident Patient language and need for interpreter reviewed:: Yes Do you feel safe going back to the place where you live?: Yes      Need for Family Participation in Patient Care: Yes (Comment) Care giver support system in place?: Yes (comment)   Criminal Activity/Legal Involvement Pertinent to Current Situation/Hospitalization: No - Comment as needed  Activities of Daily Living      Permission Sought/Granted         Permission granted to share info w AGENCY: [facility] SNF        Emotional Assessment Appearance:: Appears stated age Attitude/Demeanor/Rapport: Unable to Assess Affect (typically observed): Unable to Assess Orientation: : Oriented to Self Alcohol / Substance Use: Not Applicable Psych Involvement: No (comment)  Admission diagnosis:  Acute respiratory failure  with hypercapnia ([place]) [J96.02] Acute encephalopathy [G93.40] CO2 narcosis [R06.89] ESRD on hemodialysis ([place]) [N18.6, X90.2] Acute metabolic encephalopathy [I09.73] Patient Active Problem List   Diagnosis Date Noted   Acute respiratory failure with hypercapnia ([place]) 12/17/2021   CO2 narcosis 12/17/2021   Sinus bradycardia 12/16/2021   Acute on chronic respiratory failure with hypoxia and hypercapnia ([place]) 12/16/2021   Acute on chronic combined systolic and diastolic CHF (congestive heart failure) ([place]) 12/16/2021   Elevated troponin 12/16/2021   Anasarca 12/05/2021   Acute encephalopathy 12/04/2021   Pleural effusion on right 12/04/2021   Hematuria 12/04/2021   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   PCP:  Marsh Dolly, MD Pharmacy:   [facility], [place] [address] Phone: [phone] Fax: [place],  [address] [place] [address] Phone: [phone] Fax: [phone]     Social Determinants of Health (SDOH) Interventions    Readmission Risk Interventions    12/20/2021    3:56 PM 09/10/2019    1:35 PM  Readmission Risk Prevention Plan  Transportation Screening Complete Complete  PCP or Specialist Appt within 3-5 Days Complete Patient refused  [place] or Home Care Consult  Patient refused  Social Work Consult for [place] Planning/Counseling Complete Complete  Palliative Care Screening Complete Not Applicable  Medication Review Press photographer) Complete Complete

## 2021-12-20 NOTE — Progress Notes (Signed)
Pharmacy Antibiotic Note  Antonio Phillips is a 76 y.o. male admitted on 12/16/2021 with bacteremia. PMH significant for DM, HLD< CAD, HTN, Afib on Eliquis, and ESRD on HD TuThSa. Blood cultures from 10/12 growing MRSE in 4 of 4 bottles, but drawn at same time both from R arm. Vancomycin initiated due to hypotension/declining clinical status on 10/13. Repeat blood cultures on 10/15 also both drawn from R hand. Pharmacy has been consulted for vancomycin dosing.  Plan: Day 3 of antibiotics Continue Vancomycin 1000 mg IV after HD on TuThSa. Goal AUC 400-550. Consider checking post HD vanc level after HD on Thursday Continue to monitor culture results and follow nephrology plan   Height: 4\' 3"  (129.5 cm) Weight: 95.8 kg (211 lb 3.2 oz) IBW/kg (Calculated) : 29.3  Temp (24hrs), Avg:98.4 F (36.9 C), Min:97.4 F (36.3 C), Max:99.1 F (37.3 C)  Recent Labs  Lab 12/16/21 1224 12/17/21 0501 12/18/21 0721 12/20/21 0746  WBC 6.7 4.7 4.2 6.1  CREATININE 3.89* 2.80* 3.59* 3.68*  LATICACIDVEN 1.4 1.3  --   --      Estimated Creatinine Clearance: 13.3 mL/min (A) (by C-G formula based on SCr of 3.68 mg/dL (H)).    Allergies  Allergen Reactions   Simvastatin Other (See Comments)    Antimicrobials this admission: 10/13 Vancomycin >>   Microbiology results: 10/12 BCx: 4 of 4 MRSE (R- clinda, erythro) 10/15 BCx: NG<24H  Thank you for allowing pharmacy to be a part of this patient's care.  Gretel Acre, PharmD PGY1 Pharmacy Resident 12/20/2021 11:28 AM

## 2021-12-20 NOTE — Consult Note (Addendum)
NAME: Antonio Phillips  DOB: [birth year]  MRN: [id number]  Date/Time: 12/20/2021 2:14 PM  REQUESTING PROVIDER: Dr.Alexander Subjective:  REASON FOR CONSULT: MRSE bacteremia ?No history available from patient Antonio Phillips is a 76 y.o. with a history of ESRD, CAD< AFIB, PAD rt BKA and left AKA, prostate ca. B/l THA was sent to the ED on 10/12 from dialysis center due to chest pain and altered mental status   12/16/21  BP 85/56 (L)  Temp 97.4 F (36.3 C) !  Pulse Rate 36 (L)  Resp 18  SpO2 94 %  O2 Flow Rate (L/min) 4 L/min     Latest Reference Range & Units 12/16/21  WBC 4.0 - 10.5 K/uL 6.7  Hemoglobin 13.0 - 17.0 g/dL 12.0 (L)  HCT 39.0 - 52.0 % 42.1  Platelets 150 - 400 K/uL 80 (L) [1]  Creatinine 0.61 - 1.24 mg/dL 3.89 (H)   Blood gas showed PH 7.16, Pco2 86, PO2 50  He was recently in the hospital 9/30- 10/3 for altered mental status due to missing dilaysis He had anasarca Underwent thoracentesis on 12/06/21 - transudate Also had hematuria- and was supposed to see urology as OP  I am asked to see patient as his blood culture from ED is staph epi  Past Medical History:  Diagnosis Date   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 02/2021 Echo:  EF 40-45%, nl RV fxn, midlly dil LA, mild MR.   Coronary artery disease    a. 09/2020 Cath/PCI: LM nl, LAD 50ost/p, LCX min irregs, OM2 99 (2.5x15 Resolute Onyx DES), RCA mild diff dzs, RPDA nl.   Demand ischemia    a. 11/2017 elev Trop in setting of AFib/SVT-->Echo [facility]): EF>55%. Mild LVH. Nl RV fxn.   Diabetes ([place])    a. Pt states that he has no hx of diabetes--A1c 5.5 11/2018.   ESRD (end stage renal disease) ([place])    a. On HD since 2019.   Hyperlipidemia    Hypotension    a. On midodrine.   Ischemic cardiomyopathy    a. 02/2021 Echo:  EF 40-45%.   Morbid obesity ([place])    PAD (peripheral artery disease) ([place])    a. s/p L AKA and R BKA.   Permanent atrial fibrillation ([place])    a. Dx  2019. CHA2DS2VASc = 3-4-->Eliquis.    Past Surgical History:  Procedure Laterality Date   AV FISTULA REPAIR     BELOW KNEE LEG AMPUTATION Bilateral    CHOLECYSTECTOMY     ERCP N/A 02/04/2021   Procedure: ENDOSCOPIC RETROGRADE CHOLANGIOPANCREATOGRAPHY (ERCP);  Surgeon: Lucilla Lame, MD;  Location: [facility] ENDOSCOPY;  Service: Endoscopy;  Laterality: N/A;   LEFT HEART CATH AND CORONARY ANGIOGRAPHY N/A 09/22/2020   Procedure: LEFT HEART CATH AND CORONARY ANGIOGRAPHY;  Surgeon: Sherren Mocha, MD;  Location: [facility];  Service: Cardiovascular;  Laterality: N/A;   TOTAL HIP ARTHROPLASTY Bilateral     Social History   Socioeconomic History   Marital status: Single    Spouse name: Not on file   Number of children: Not on file   Years of education: Not on file   Highest education level: Not on file  Occupational History   Not on file  Tobacco Use   Smoking status: Never   Smokeless tobacco: Never  Vaping Use   Vaping Use: Never used  Substance and Sexual Activity   Alcohol use: Never   Drug use: Never   Sexual  activity: Not on file  Other Topics Concern   Not on file  Social History Narrative   Lives @ local nsg facility.  Does not routinely exercise.   Social Determinants of Health   Financial Resource Strain: Not on file  Food Insecurity: No Food Insecurity (12/05/2021)   Hunger Vital Sign    Worried About Running Out of Food in the Last Year: Never true    Ran Out of Food in the Last Year: Never true  Transportation Needs: No Transportation Needs (12/05/2021)   PRAPARE - Hydrologist (Medical): No    Lack of Transportation (Non-Medical): No  Physical Activity: Not on file  Stress: Not on file  Social Connections: Not on file  Intimate Partner Violence: Not At Risk (12/05/2021)   Humiliation, Afraid, Rape, and Kick questionnaire    Fear of Current or Ex-Partner: No    Emotionally Abused: No    Physically Abused: No    Sexually Abused:  No    History reviewed. No pertinent family history. Allergies  Allergen Reactions   Simvastatin Other (See Comments)   I? Current Facility-Administered Medications  Medication Dose Route Frequency Provider Last Rate Last Admin   acetaminophen (TYLENOL) tablet 650 mg  650 mg Oral Q6H PRN Agbata, Tochukwu, MD   650 mg at 12/19/21 2111   alteplase (CATHFLO ACTIVASE) injection 2 mg  2 mg Intracatheter Once PRN Colon Flattery, NP       anticoagulant sodium citrate solution 5 mL  5 mL Intracatheter PRN Colon Flattery, NP       artificial tears (LACRILUBE) ophthalmic ointment   Both Eyes BID Lorna Dibble, RPH   Given at 12/20/21 0948   atorvastatin (LIPITOR) tablet 40 mg  40 mg Oral QHS Agbata, Tochukwu, MD   40 mg at 12/19/21 2111   calcitRIOL (ROCALTROL) capsule 0.25 mcg  0.25 mcg Oral Daily Agbata, Tochukwu, MD   0.25 mcg at 12/20/21 0948   calcium acetate (PHOSLO) capsule 1,334 mg  1,334 mg Oral TID Collier Bullock, MD   1,334 mg at 12/20/21 0947   Chlorhexidine Gluconate Cloth 2 % PADS 6 each  6 each Topical Q0600 Colon Flattery, NP   6 each at 12/18/21 0501   cyanocobalamin (VITAMIN B12) tablet 1,000 mcg  1,000 mcg Oral Daily Agbata, Tochukwu, MD   1,000 mcg at 47/42/59 5638   folic acid (FOLVITE) tablet 1 mg  1 mg Oral Daily Agbata, Tochukwu, MD   1 mg at 12/20/21 7564   Gerhardt's butt cream   Topical Q4H Oswald [place], MD   Given at 12/20/21 0403   heparin injection 1,000 Units  1,000 Units Intracatheter PRN Colon Flattery, NP       lactulose (CHRONULAC) 10 GM/15ML solution 10 g  10 g Oral BID PRN Agbata, Tochukwu, MD   10 g at 12/19/21 0753   lidocaine (PF) (XYLOCAINE) 1 % injection 5 mL  5 mL Intradermal PRN Breeze, Benancio Deeds, NP       lidocaine-prilocaine (EMLA) cream 1 Application  1 Application Topical PRN Breeze, Shantelle, NP       midodrine (PROAMATINE) tablet 20 mg  20 mg Oral TID WC Oswald [place], MD   20 mg at 12/20/21 0947   ondansetron (ZOFRAN) tablet 4 mg   4 mg Oral Q6H PRN Agbata, Tochukwu, MD       Or   ondansetron (ZOFRAN) injection 4 mg  4 mg Intravenous Q6H PRN Agbata, Tochukwu, MD  pentafluoroprop-tetrafluoroeth (GEBAUERS) aerosol 1 Application  1 Application Topical PRN Breeze, Benancio Deeds, NP       polyvinyl alcohol (LIQUIFILM TEARS) 1.4 % ophthalmic solution 1 drop  1 drop Both Eyes TID PRN Agbata, Tochukwu, MD       pregabalin (LYRICA) capsule 75 mg  75 mg Oral QHS Agbata, Tochukwu, MD   75 mg at 12/19/21 2111   rOPINIRole (REQUIP) tablet 1 mg  1 mg Oral QHS Agbata, Tochukwu, MD   1 mg at 12/19/21 2111   sertraline (ZOLOFT) tablet 50 mg  50 mg Oral Daily Agbata, Tochukwu, MD   50 mg at 12/20/21 0947   sevelamer carbonate (RENVELA) tablet 1,600 mg  1,600 mg Oral 3 times per day on Sun Mon Wed Fri Agbata, Tochukwu, MD   1,600 mg at 12/19/21 1724   vancomycin (VANCOCIN) IVPB 1000 mg/200 mL premix  1,000 mg Intravenous Q T,Th,Sa-HD Wynelle Cleveland, [place] at 12/18/21 2159     Abtx:  Anti-infectives (From admission, onward)    Start     Dose/Rate Route Frequency Ordered Stop   12/18/21 2000  vancomycin (VANCOCIN) IVPB 1000 mg/200 mL premix        1,000 mg 200 mL/hr over 60 Minutes Intravenous Every T-Th-Sa (Hemodialysis) 12/17/21 1739     12/17/21 1830  vancomycin (VANCOREADY) IVPB 2000 mg/400 mL        2,000 mg 200 mL/hr over 120 Minutes Intravenous  Once 12/17/21 1738 12/17/21 2015       REVIEW OF SYSTEMS:  NA as pt is somnolent Objective:  VITALS:  BP 106/71 (BP Location: Right Arm)   Pulse 81   Temp 97.7 F (36.5 C) (Oral)   Resp 16   Ht 4\' 3"  (1.295 m)   Wt 95.8 kg   SpO2 100%   BMI 57.09 kg/m  LDA Left AVf PHYSICAL EXAM:  General: somnolent Does not wake up to calling his name Opens eyes to stimuli Head: Normocephalic, without obvious abnormality, atraumatic. Eyes: Conjunctivae clear, anicteric sclerae. Pupils are equal ENT Nares normal. No drainage or sinus tenderness. Lips, mucosa, and tongue  normal. No Thrush Neck:  symmetrical, no adenopathy, thyroid: non tender no carotid bruit and no JVD. Back: did examine Lungs: b/l air entry Heart: irregular - rate controlled. Abdomen: Soft, ascites Extremities: left AKA- stump has a scab Rt BKA Knee has a superficial abrasion Skin: rt arm skin tears Lymph: Cervical, supraclavicular normal. Neurologic: somnolent  Pertinent Labs Lab Results CBC    Component Value Date/Time   WBC 6.1 12/20/2021 0746   RBC 3.43 (L) 12/20/2021 0746   HGB 10.9 (L) 12/20/2021 0746   HGB 15.2 11/14/2012 2045   HCT 37.4 (L) 12/20/2021 0746   HCT 45.0 11/14/2012 2045   PLT 53 (L) 12/20/2021 0746   PLT 116 (L) 11/14/2012 2045   MCV 109.0 (H) 12/20/2021 0746   MCV 95 11/14/2012 2045   MCH 31.8 12/20/2021 0746   MCHC 29.1 (L) 12/20/2021 0746   RDW 20.3 (H) 12/20/2021 0746   RDW 15.2 (H) 11/14/2012 2045   LYMPHSABS 2.2 12/16/2021 1224   MONOABS 0.5 12/16/2021 1224   EOSABS 0.1 12/16/2021 1224   BASOSABS 0.0 12/16/2021 1224       Latest Ref Rng & Units 12/20/2021    7:46 AM 12/18/2021    7:21 AM 12/17/2021    5:01 AM  CMP  Glucose 70 - 99 mg/dL 94  78  72   BUN 8 - 23 mg/dL 29  28  19   Creatinine 0.61 - 1.24 mg/dL 3.68  3.59  2.80   Sodium 135 - 145 mmol/L 138  138  141   Potassium 3.5 - 5.1 mmol/L 3.9  4.0  3.6   Chloride 98 - 111 mmol/L 102  101  101   CO2 22 - 32 mmol/L 28  27  28    Calcium 8.9 - 10.3 mg/dL 9.2  9.0  9.0       Microbiology: Recent Results (from the past 240 hour(s))  Blood Culture (routine x 2)     Status: Abnormal   Collection Time: 12/16/21 12:24 PM   Specimen: BLOOD  Result Value Ref Range Status   Specimen Description   Final    BLOOD RIGHT [facility] Performed at [facility], [address], [place], [place] [address]    Special Requests   Final    BOTTLES DRAWN AEROBIC AND ANAEROBIC Blood Culture results may not be optimal due to an inadequate volume of blood received in culture bottles Performed  at [facility], [address] [place], [place] [address]    Culture  Setup Time   Final    GRAM POSITIVE COCCI IN BOTH AEROBIC AND ANAEROBIC BOTTLES CRITICAL VALUE NOTED.  VALUE IS CONSISTENT WITH PREVIOUSLY REPORTED AND CALLED VALUE. Performed at [facility], [address] [place], [place] [address]    Culture (A)  Final    STAPHYLOCOCCUS EPIDERMIDIS SUSCEPTIBILITIES PERFORMED ON PREVIOUS CULTURE WITHIN THE LAST 5 DAYS. Performed at [facility], [place] [address], [place], [place] [address]    Report Status 12/19/2021 FINAL  Final  Blood Culture (routine x 2)     Status: Abnormal   Collection Time: 12/16/21 12:24 PM   Specimen: BLOOD  Result Value Ref Range Status   Specimen Description   Final    BLOOD BLOOD RIGHT FOREARM Performed at [facility], [place] [address], [place], [place] [address]    Special Requests   Final    BOTTLES DRAWN AEROBIC AND ANAEROBIC Blood Culture results may not be optimal due to an inadequate volume of blood received in culture bottles Performed at [facility], [address] [place], [place] [address]    Culture  Setup Time   Final    GRAM POSITIVE COCCI IN BOTH AEROBIC AND ANAEROBIC BOTTLES Organism ID to follow CRITICAL RESULT CALLED TO, READ BACK BY AND VERIFIED WITH: JASON ROBBINS@0626  12/17/21 RH Performed at [facility], [address] [place], [place] [address]    Culture STAPHYLOCOCCUS EPIDERMIDIS (A)  Final   Report Status 12/19/2021 FINAL  Final   Organism ID, Bacteria STAPHYLOCOCCUS EPIDERMIDIS  Final      Susceptibility   Staphylococcus epidermidis - MIC*    CIPROFLOXACIN <=0.5 SENSITIVE Sensitive     ERYTHROMYCIN >=8 RESISTANT Resistant     GENTAMICIN <=0.5 SENSITIVE Sensitive     OXACILLIN >=4 RESISTANT Resistant     TETRACYCLINE <=1 SENSITIVE Sensitive     VANCOMYCIN 1 SENSITIVE Sensitive     TRIMETH/SULFA <=10 SENSITIVE Sensitive     CLINDAMYCIN >=8 RESISTANT Resistant      RIFAMPIN <=0.5 SENSITIVE Sensitive     Inducible Clindamycin NEGATIVE Sensitive     * STAPHYLOCOCCUS EPIDERMIDIS  Blood Culture ID Panel (Reflexed)     Status: Abnormal   Collection Time: 12/16/21 12:24 PM  Result Value Ref Range Status   Enterococcus faecalis NOT DETECTED NOT DETECTED Final   Enterococcus Faecium NOT DETECTED  NOT DETECTED Final   Listeria monocytogenes NOT DETECTED NOT DETECTED Final   Staphylococcus species DETECTED (A) NOT DETECTED Final    Comment: CRITICAL RESULT CALLED TO, READ BACK BY AND VERIFIED WITH: JASON ROBBINS@0626  12/17/21 RH    Staphylococcus aureus (BCID) NOT DETECTED NOT DETECTED Final   Staphylococcus epidermidis DETECTED (A) NOT DETECTED Final    Comment: Methicillin (oxacillin) resistant coagulase negative staphylococcus. Possible blood culture contaminant (unless isolated from more than one blood culture draw or clinical case suggests pathogenicity). No antibiotic treatment is indicated for blood  culture contaminants. CRITICAL RESULT CALLED TO, READ BACK BY AND VERIFIED WITH: JASON ROBBINS@0626  12/17/21 RH    Staphylococcus lugdunensis NOT DETECTED NOT DETECTED Final   Streptococcus species NOT DETECTED NOT DETECTED Final   Streptococcus agalactiae NOT DETECTED NOT DETECTED Final   Streptococcus pneumoniae NOT DETECTED NOT DETECTED Final   Streptococcus pyogenes NOT DETECTED NOT DETECTED Final   A.calcoaceticus-baumannii NOT DETECTED NOT DETECTED Final   Bacteroides fragilis NOT DETECTED NOT DETECTED Final   Enterobacterales NOT DETECTED NOT DETECTED Final   Enterobacter cloacae complex NOT DETECTED NOT DETECTED Final   Escherichia coli NOT DETECTED NOT DETECTED Final   Klebsiella aerogenes NOT DETECTED NOT DETECTED Final   Klebsiella oxytoca NOT DETECTED NOT DETECTED Final   Klebsiella pneumoniae NOT DETECTED NOT DETECTED Final   Proteus species NOT DETECTED NOT DETECTED Final   Salmonella species NOT DETECTED NOT DETECTED Final    Serratia marcescens NOT DETECTED NOT DETECTED Final   Haemophilus influenzae NOT DETECTED NOT DETECTED Final   Neisseria meningitidis NOT DETECTED NOT DETECTED Final   Pseudomonas aeruginosa NOT DETECTED NOT DETECTED Final   Stenotrophomonas maltophilia NOT DETECTED NOT DETECTED Final   Candida albicans NOT DETECTED NOT DETECTED Final   Candida auris NOT DETECTED NOT DETECTED Final   Candida glabrata NOT DETECTED NOT DETECTED Final   Candida krusei NOT DETECTED NOT DETECTED Final   Candida parapsilosis NOT DETECTED NOT DETECTED Final   Candida tropicalis NOT DETECTED NOT DETECTED Final   Cryptococcus neoformans/gattii NOT DETECTED NOT DETECTED Final   Methicillin resistance mecA/C DETECTED (A) NOT DETECTED Final    Comment: CRITICAL RESULT CALLED TO, READ BACK BY AND VERIFIED WITH: JASON ROBBINS@0626  12/17/21 RH Performed at [facility], [address] [place], [place] [address]   Resp Panel by RT-PCR (Flu A&B, Covid) Anterior Nasal Swab     Status: None   Collection Time: 12/16/21  1:10 PM   Specimen: Anterior Nasal Swab  Result Value Ref Range Status   SARS Coronavirus 2 by RT PCR NEGATIVE NEGATIVE Final    Comment: (NOTE) SARS-CoV-2 target nucleic acids are NOT DETECTED.  The SARS-CoV-2 RNA is generally detectable in upper respiratory specimens during the acute phase of infection. The lowest concentration of SARS-CoV-2 viral copies this assay can detect is 138 copies/mL. A negative result does not preclude SARS-Cov-2 infection and should not be used as the sole basis for treatment or other patient management decisions. A negative result may occur with  improper specimen collection/handling, submission of specimen other than nasopharyngeal swab, presence of viral mutation(s) within the areas targeted by this assay, and inadequate number of viral copies(<138 copies/mL). A negative result must be combined with clinical observations, patient history, and  epidemiological information. The expected result is Negative.  Fact Sheet for Patients:  EntrepreneurPulse.com.au  Fact Sheet for Healthcare Providers:  IncredibleEmployment.be  This test is no t yet approved or cleared by the [place] FDA and  has been  authorized for detection and/or diagnosis of SARS-CoV-2 by FDA under an Emergency Use Authorization (EUA). This EUA will remain  in effect (meaning this test can be used) for the duration of the COVID-19 declaration under Section 564(b)(1) of the Act, 21 U.S.C.section 360bbb-3(b)(1), unless the authorization is terminated  or revoked sooner.       Influenza A by PCR NEGATIVE NEGATIVE Final   Influenza B by PCR NEGATIVE NEGATIVE Final    Comment: (NOTE) The Xpert Xpress SARS-CoV-2/FLU/RSV plus assay is intended as an aid in the diagnosis of influenza from Nasopharyngeal swab specimens and should not be used as a sole basis for treatment. Nasal washings and aspirates are unacceptable for Xpert Xpress SARS-CoV-2/FLU/RSV testing.  Fact Sheet for Patients: EntrepreneurPulse.com.au  Fact Sheet for Healthcare Providers: IncredibleEmployment.be  This test is not yet approved or cleared by the [place] FDA and has been authorized for detection and/or diagnosis of SARS-CoV-2 by FDA under an Emergency Use Authorization (EUA). This EUA will remain in effect (meaning this test can be used) for the duration of the COVID-19 declaration under Section 564(b)(1) of the Act, 21 U.S.C. section 360bbb-3(b)(1), unless the authorization is terminated or revoked.  Performed at [facility], [address] [place], [place] [address]   Culture, blood (Routine X 2) w Reflex to ID Panel     Status: None (Preliminary result)   Collection Time: 12/19/21  4:12 PM   Specimen: BLOOD RIGHT HAND  Result Value Ref Range Status   Specimen Description BLOOD RIGHT  HAND  Final   Special Requests   Final    BOTTLES DRAWN AEROBIC AND ANAEROBIC Blood Culture adequate volume   Culture   Final    NO GROWTH < 24 HOURS Performed at [facility], [address], [place], [place] [address]    Report Status PENDING  Incomplete  Culture, blood (Routine X 2) w Reflex to ID Panel     Status: None (Preliminary result)   Collection Time: 12/19/21  4:18 PM   Specimen: BLOOD RIGHT HAND  Result Value Ref Range Status   Specimen Description BLOOD RIGHT HAND  Final   Special Requests   Final    BOTTLES DRAWN AEROBIC AND ANAEROBIC Blood Culture adequate volume   Culture   Final    NO GROWTH < 24 HOURS Performed at [facility], [address] [place], [place] [address]    Report Status PENDING  Incomplete    IMAGING RESULTS: 2 d echo EF 55% RV enlarged PAP increased- 58 LA mild to moderately dilated Moderate pleural effusion left Mitral and aortic valve calcification  CXR  I have personally reviewed the films ?cardiomegaly B/l pleural effusion rt > left Pulmonary congestion  Impression/Recommendation ? Hypercapnia  /Co2 narcosis causing  respiratory acidosis and metabolic encephalopathy  OSA  CAD/  CHF-   Afib Pulmonary HTN  Anasarca  ESRD Macrocytic anemia Thrombocytopenia- unclear cause  could he have cirrhosis?  Staph epidermidis bacteremia- taken for the same site - very likely contaminant, he does have some skin tears but they do not look grossly infected Repeat blood culture sent- if negative then will DC vanco after completion of 7 days If repeat blood culture remains positive then will need a longer duration   Left AKA Rt BKA PAD  Morbid obesity ___________________________________________________ Discussed with his nurse and  requesting provider Note:  This document was prepared using Dragon voice recognition software and may include unintentional dictation errors.

## 2021-12-20 NOTE — Progress Notes (Addendum)
[facility]  ROUNDING NOTE   Subjective:   Antonio Phillips is a 76 year old male with past medical conditions including diabetes, hyperlipidemia, CAD, hypertension, atrial fibrillation on Eliquis, and end-stage renal disease on hemodialysis.  Patient presents to the emergency department from his dialysis clinic with complaints of chest pain/pressure, hypotension, and decreased mentation.  Patient has been admitted for Acute respiratory failure with hypercapnia ([place]) [J96.02] Acute encephalopathy [G93.40] CO2 narcosis [R06.89] ESRD on hemodialysis ([place]) [N18.6, C62.3] Acute metabolic encephalopathy [J62.83]  Patient is known to our practice and receives outpatient dialysis treatments at [facility] on a TTS schedule, supervised by Dr. Candiss Norse.  Patient resting comfortably in the bed Patient offers no new specific physical complaints  Objective:  Vital signs in last 24 hours:  Temp:  [97.7 F (36.5 C)-99.1 F (37.3 C)] 97.7 F (36.5 C) (10/16 0518) Pulse Rate:  [77-101] 84 (10/16 0518) Resp:  [17-20] 20 (10/16 0518) BP: (93-104)/(58-67) 93/58 (10/16 0518) SpO2:  [90 %-100 %] 100 % (10/16 0518)  Weight change:  Filed Weights   12/16/21 1238 12/18/21 0328  Weight: 96.2 kg 95.8 kg    Intake/Output: I/O last 3 completed shifts: In: 221 [I.V.:3; IV Piggyback:218] Out: 400 [Other:400]   Intake/Output this shift:  No intake/output data recorded.  Physical Exam: General:  NAD  Head: Normocephalic, atraumatic.   Eyes: Anicteric  Lungs:  Crackles Present, normal effort, [place] O2, Decreased breath sounds at bases  Heart: Irregular rate and rhythm  Abdomen:  Soft, nontender, obese  Extremities: 1+ peripheral edema.  Neurologic: Nonfocal, moving all four extremities  Skin: No lesions  Access: Left upper AVF    Basic Metabolic Panel: Recent Labs  Lab 12/16/21 1224 12/17/21 0501 12/18/21 0721  NA 141 141 138  K 3.7 3.6 4.0  CL 103 101 101  CO2 28  28 27   GLUCOSE 101* 72 78  BUN 31* 19 28*  CREATININE 3.89* 2.80* 3.59*  CALCIUM 9.5 9.0 9.0    Liver Function Tests: Recent Labs  Lab 12/16/21 1224  AST 16  ALT 7  ALKPHOS 70  BILITOT 1.3*  PROT 6.1*  ALBUMIN 3.4*   No results for input(s): "LIPASE", "AMYLASE" in the last 168 hours. Recent Labs  Lab 12/16/21 1224  AMMONIA 36*    CBC: Recent Labs  Lab 12/16/21 1224 12/17/21 0501 12/18/21 0721  WBC 6.7 4.7 4.2  NEUTROABS 3.9  --   --   HGB 12.0* 10.4* 10.3*  HCT 42.1 36.3* 35.6*  MCV 109.9* 111.0* 110.6*  PLT 80* 63* 20*    Cardiac Enzymes: No results for input(s): "CKTOTAL", "CKMB", "CKMBINDEX", "TROPONINI" in the last 168 hours.  BNP: Invalid input(s): "POCBNP"  CBG: Recent Labs  Lab 12/17/21 0113 12/18/21 0250  GLUCAP 80 73    Microbiology: Results for orders placed or performed during the hospital encounter of 12/16/21  Blood Culture (routine x 2)     Status: Abnormal   Collection Time: 12/16/21 12:24 PM   Specimen: BLOOD  Result Value Ref Range Status   Specimen Description   Final    BLOOD RIGHT [facility] Performed at [facility], [address], [place], [place] [address]    Special Requests   Final    BOTTLES DRAWN AEROBIC AND ANAEROBIC Blood Culture results may not be optimal due to an inadequate volume of blood received in culture bottles Performed at [facility], [address] [place], [place] [address]    Culture  Setup Time  Final    GRAM POSITIVE COCCI IN BOTH AEROBIC AND ANAEROBIC BOTTLES CRITICAL VALUE NOTED.  VALUE IS CONSISTENT WITH PREVIOUSLY REPORTED AND CALLED VALUE. Performed at [facility], [address] [place], [place] [address]    Culture (A)  Final    STAPHYLOCOCCUS EPIDERMIDIS SUSCEPTIBILITIES PERFORMED ON PREVIOUS CULTURE WITHIN THE LAST 5 DAYS. Performed at [facility], [place] [address] [place], [place] [address]    Report Status 12/19/2021 FINAL  Final  Blood Culture  (routine x 2)     Status: Abnormal   Collection Time: 12/16/21 12:24 PM   Specimen: BLOOD  Result Value Ref Range Status   Specimen Description   Final    BLOOD BLOOD RIGHT FOREARM Performed at [facility], [place] [address], [place], [place] [address]    Special Requests   Final    BOTTLES DRAWN AEROBIC AND ANAEROBIC Blood Culture results may not be optimal due to an inadequate volume of blood received in culture bottles Performed at [facility], [address] [place], [place] [address]    Culture  Setup Time   Final    GRAM POSITIVE COCCI IN BOTH AEROBIC AND ANAEROBIC BOTTLES Organism ID to follow CRITICAL RESULT CALLED TO, READ BACK BY AND VERIFIED WITH: JASON ROBBINS@0626  12/17/21 RH Performed at [facility], [address] [place], [place] [address]    Culture STAPHYLOCOCCUS EPIDERMIDIS (A)  Final   Report Status 12/19/2021 FINAL  Final   Organism ID, Bacteria STAPHYLOCOCCUS EPIDERMIDIS  Final      Susceptibility   Staphylococcus epidermidis - MIC*    CIPROFLOXACIN <=0.5 SENSITIVE Sensitive     ERYTHROMYCIN >=8 RESISTANT Resistant     GENTAMICIN <=0.5 SENSITIVE Sensitive     OXACILLIN >=4 RESISTANT Resistant     TETRACYCLINE <=1 SENSITIVE Sensitive     VANCOMYCIN 1 SENSITIVE Sensitive     TRIMETH/SULFA <=10 SENSITIVE Sensitive     CLINDAMYCIN >=8 RESISTANT Resistant     RIFAMPIN <=0.5 SENSITIVE Sensitive     Inducible Clindamycin NEGATIVE Sensitive     * STAPHYLOCOCCUS EPIDERMIDIS  Blood Culture ID Panel (Reflexed)     Status: Abnormal   Collection Time: 12/16/21 12:24 PM  Result Value Ref Range Status   Enterococcus faecalis NOT DETECTED NOT DETECTED Final   Enterococcus Faecium NOT DETECTED NOT DETECTED Final   Listeria monocytogenes NOT DETECTED NOT DETECTED Final   Staphylococcus species DETECTED (A) NOT DETECTED Final    Comment: CRITICAL RESULT CALLED TO, READ BACK BY AND VERIFIED WITH: JASON ROBBINS@0626  12/17/21 RH     Staphylococcus aureus (BCID) NOT DETECTED NOT DETECTED Final   Staphylococcus epidermidis DETECTED (A) NOT DETECTED Final    Comment: Methicillin (oxacillin) resistant coagulase negative staphylococcus. Possible blood culture contaminant (unless isolated from more than one blood culture draw or clinical case suggests pathogenicity). No antibiotic treatment is indicated for blood  culture contaminants. CRITICAL RESULT CALLED TO, READ BACK BY AND VERIFIED WITH: JASON ROBBINS@0626  12/17/21 RH    Staphylococcus lugdunensis NOT DETECTED NOT DETECTED Final   Streptococcus species NOT DETECTED NOT DETECTED Final   Streptococcus agalactiae NOT DETECTED NOT DETECTED Final   Streptococcus pneumoniae NOT DETECTED NOT DETECTED Final   Streptococcus pyogenes NOT DETECTED NOT DETECTED Final   A.calcoaceticus-baumannii NOT DETECTED NOT DETECTED Final   Bacteroides fragilis NOT DETECTED NOT DETECTED Final   Enterobacterales NOT DETECTED NOT DETECTED Final   Enterobacter cloacae complex NOT DETECTED NOT DETECTED Final   Escherichia coli NOT DETECTED  NOT DETECTED Final   Klebsiella aerogenes NOT DETECTED NOT DETECTED Final   Klebsiella oxytoca NOT DETECTED NOT DETECTED Final   Klebsiella pneumoniae NOT DETECTED NOT DETECTED Final   Proteus species NOT DETECTED NOT DETECTED Final   Salmonella species NOT DETECTED NOT DETECTED Final   Serratia marcescens NOT DETECTED NOT DETECTED Final   Haemophilus influenzae NOT DETECTED NOT DETECTED Final   Neisseria meningitidis NOT DETECTED NOT DETECTED Final   Pseudomonas aeruginosa NOT DETECTED NOT DETECTED Final   Stenotrophomonas maltophilia NOT DETECTED NOT DETECTED Final   Candida albicans NOT DETECTED NOT DETECTED Final   Candida auris NOT DETECTED NOT DETECTED Final   Candida glabrata NOT DETECTED NOT DETECTED Final   Candida krusei NOT DETECTED NOT DETECTED Final   Candida parapsilosis NOT DETECTED NOT DETECTED Final   Candida tropicalis NOT DETECTED NOT  DETECTED Final   Cryptococcus neoformans/gattii NOT DETECTED NOT DETECTED Final   Methicillin resistance mecA/C DETECTED (A) NOT DETECTED Final    Comment: CRITICAL RESULT CALLED TO, READ BACK BY AND VERIFIED WITH: JASON ROBBINS@0626  12/17/21 RH Performed at [facility], [address] [place],  [address]   Resp Panel by RT-PCR (Flu A&B, Covid) Anterior Nasal Swab     Status: None   Collection Time: 12/16/21  1:10 PM   Specimen: Anterior Nasal Swab  Result Value Ref Range Status   SARS Coronavirus 2 by RT PCR NEGATIVE NEGATIVE Final    Comment: (NOTE) SARS-CoV-2 target nucleic acids are NOT DETECTED.  The SARS-CoV-2 RNA is generally detectable in upper respiratory specimens during the acute phase of infection. The lowest concentration of SARS-CoV-2 viral copies this assay can detect is 138 copies/mL. A negative result does not preclude SARS-Cov-2 infection and should not be used as the sole basis for treatment or other patient management decisions. A negative result may occur with  improper specimen collection/handling, submission of specimen other than nasopharyngeal swab, presence of viral mutation(s) within the areas targeted by this assay, and inadequate number of viral copies(<138 copies/mL). A negative result must be combined with clinical observations, patient history, and epidemiological information. The expected result is Negative.  Fact Sheet for Patients:  EntrepreneurPulse.com.au  Fact Sheet for Healthcare Providers:  IncredibleEmployment.be  This test is no t yet approved or cleared by the [place] FDA and  has been authorized for detection and/or diagnosis of SARS-CoV-2 by FDA under an Emergency Use Authorization (EUA). This EUA will remain  in effect (meaning this test can be used) for the duration of the COVID-19 declaration under Section 564(b)(1) of the Act, 21 U.S.C.section 360bbb-3(b)(1), unless the  authorization is terminated  or revoked sooner.       Influenza A by PCR NEGATIVE NEGATIVE Final   Influenza B by PCR NEGATIVE NEGATIVE Final    Comment: (NOTE) The Xpert Xpress SARS-CoV-2/FLU/RSV plus assay is intended as an aid in the diagnosis of influenza from Nasopharyngeal swab specimens and should not be used as a sole basis for treatment. Nasal washings and aspirates are unacceptable for Xpert Xpress SARS-CoV-2/FLU/RSV testing.  Fact Sheet for Patients: EntrepreneurPulse.com.au  Fact Sheet for Healthcare Providers: IncredibleEmployment.be  This test is not yet approved or cleared by the [place] FDA and has been authorized for detection and/or diagnosis of SARS-CoV-2 by FDA under an Emergency Use Authorization (EUA). This EUA will remain in effect (meaning this test can be used) for the duration of the COVID-19 declaration under Section 564(b)(1) of the Act, 21 U.S.C. section 360bbb-3(b)(1), unless the  authorization is terminated or revoked.  Performed at [facility], [address] [place], [place] [address]     Coagulation Studies: No results for input(s): "LABPROT", "INR" in the last 72 hours.   Urinalysis: No results for input(s): "COLORURINE", "LABSPEC", "PHURINE", "GLUCOSEU", "HGBUR", "BILIRUBINUR", "KETONESUR", "PROTEINUR", "UROBILINOGEN", "NITRITE", "LEUKOCYTESUR" in the last 72 hours.  Invalid input(s): "APPERANCEUR"    Imaging: No results found.   Medications:    sodium chloride     anticoagulant sodium citrate     vancomycin Stopped (12/18/21 2159)    artificial tears   Both Eyes BID   atorvastatin  40 mg Oral QHS   calcitRIOL  0.25 mcg Oral Daily   calcium acetate  1,334 mg Oral TID   Chlorhexidine Gluconate Cloth  6 each Topical Q0600   cyanocobalamin  1,000 mcg Oral Daily   folic acid  1 mg Oral Daily   Gerhardt's butt cream   Topical Q4H   midodrine  20 mg Oral TID WC   pregabalin   75 mg Oral QHS   rOPINIRole  1 mg Oral QHS   sertraline  50 mg Oral Daily   sevelamer carbonate  1,600 mg Oral 3 times per day on Sun Mon Wed Fri   sodium chloride flush  3 mL Intravenous Q12H   sodium chloride, acetaminophen, alteplase, anticoagulant sodium citrate, heparin, lactulose, lidocaine (PF), lidocaine-prilocaine, ondansetron **OR** ondansetron (ZOFRAN) IV, pentafluoroprop-tetrafluoroeth, polyvinyl alcohol, sodium chloride flush  Assessment/ Plan:  Mr. LONZY MATO is a 76 y.o.  male with past medical conditions including diabetes, hyperlipidemia, CAD, hypertension, atrial fibrillation on Eliquis, and end-stage renal disease on hemodialysis.  Patient presents to the emergency department from his dialysis clinic with complaints of chest pain/pressure, hypotension, and decreased mentation.  Patient has been admitted for Acute respiratory failure with hypercapnia ([place]) [J96.02] Acute encephalopathy [G93.40] CO2 narcosis [R06.89] ESRD on hemodialysis ([place]) [N18.6, S96.2] Acute metabolic encephalopathy [E36.62]  [facility]/TTS/left AVF  Fluid volume overload with end-stage renal disease on hemodialysis.  Chest x-ray shows pulmonary vascular congestion.  Patient received urgent dialysis At the time of admissio.  Patient was last dialyzed on Saturday.No need for renal placement therapy today  2. Anemia of chronic kidney disease Lab Results  Component Value Date   HGB 10.3 (L) 12/18/2021    Hemoglobin at goal.  Patient receives [place] outpatient.  3. Secondary Hyperparathyroidism: with outpatient labs: PTH 172, phosphorus 5.3, calcium 8.3.  Lab Results  Component Value Date   CALCIUM 9.0 12/18/2021   PHOS 4.4 12/05/2021    We will continue to monitor bone minerals during this admission.  Continue sevelamer and calcium acetate with meals and daily calcitriol.  4. 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  Patient is being followed with the primary team  5.Thrombocytopenia Patient platelet count  low at 20.  Primary team following Will not use any heparin during dialysis  6.Pleural effusion Patient may need tap We will discuss with the primary team    LOS: 4 Malikiah Debarr [facility] 10/16/20236:49 AM

## 2021-12-20 NOTE — Progress Notes (Signed)
Pt very lethargic this morning, extremely drowsy. SpO2 97% on 4L. Arousable to sternal rub. Tried answering questions but kept falling asleep half-way through words. Congested cough, suctioned him.

## 2021-12-20 NOTE — Progress Notes (Signed)
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 He resides at [facility].  10/12: Patient was found to be in uncompensated respiratory stenosis with hypercapnia and was placed on BiPAP. Fluid overloaded d/t acute on chronic CHF. Underwent HD.  10/13: Seen by cardiology - advised continue HD. Repeat Echo. Refusing BiPap. Remains on [place] O2 at 3L. Difficult HD today d/t hypotension. Needing albumin for BP support. Palliative care consulted. No CPR/intubation. BCx from 10/12 (+)staph epidermidis methicillin resistance. Started on vancomycin 10/13 10/14: cardio s/o.  10/15: repeat BCx ordered. S/S back on staph bacteremia. Continue vanc for now. Consult ID 10/16: lethargic this AM requiring to be placed back on BiPap, improved on this. ID to see today, continue vancomycin pending any further ID recs.      Consultants:  Nephrology  Cardiology  Palliative care   Procedures: Hemodialysis   Antibiotics: Vancomycin 12/17/2021 - present     ASSESSMENT & PLAN:   Principal Problem:   Acute metabolic encephalopathy Active Problems:   Hypotension   Sinus bradycardia   Acute on chronic respiratory failure with hypoxia and hypercapnia (HCC)   Acute on chronic combined systolic and diastolic CHF (congestive heart failure) (HCC)   ESRD on hemodialysis (HCC)   Pressure injury of skin   Thrombocytopenia (HCC)    PVD (peripheral vascular disease) (HCC)   Permanent atrial fibrillation (HCC)   Morbid obesity (HCC)   Elevated troponin   Acute respiratory failure with hypercapnia (HCC)   CO2 narcosis   Acute metabolic encephalopathy - resolved Secondary to uncompensated respiratory acidosis with hypercapnia Mental status has significantly improved   Acute on chronic respiratory failure with hypoxia and hypercapnia Uncompensated respiratory acidosis on ABG Likely d/t fluid overload acute on chronic HFrEF  At baseline he wears 2 L of oxygen continuously Started on BiPAP in the ED BiPAP has been turned off now as patient could not tolerate it, placed back on oxygen via nasal cannula 3 L/min Wean to baseline O2 as tolerated   Bacteremia vs BCx contaminant - 2 cultures (+)Staph Epidermidis Blood cultures growing Staph epidermidis could be contamination, however patient was hypotensive 2 BCx positive. Staph w/ methicillin resistance, also resistant to clindamycin, erythromycin, oxacillin Started 10/13 on vancomycin per pharmacy consultation, continue this pending any changes per ID  final culture result --> (S) Vancomycin  Repeat BCx ordered 12/19/21. ID consult    Hypotension Systolic blood pressure has been hanging around in 80s Patient has been on midodrine 10 mg 3 times daily --> Dose of midodrine changed to 20 mg p.o. 3 times daily Patient also received normal saline bolus to 50 cc x 1 for hypotension two days ago.   Acute on chronic systolic CHF / HFrEF Chest x-ray showed bilateral pleural effusions with pulmonary vascular congestion LVEF 40 to 45% as per echo from December 2020  Continue renal replacement therapy Net IO Since Admission: -1,179 mL [12/20/21 0759]  ESRD Patient has history of ESRD and is on hemodialysis Tuesday Thursday and Saturday history of noncompliance with dialysis Nephrology following    Pressure injury of the skin present on admission Patient has unstageable  pressure ulcers over the gluteal region Wound care consulted   Thrombocytopenia Chronic Continue NO anticoagulation for Afib, no Plavix for PAD Repeat CBC CLose monitor for bleeding, low threshold for transfusion   Mild elevation of troponin Likely due to demand ischemia in the setting of acute on chronic systolic CHF Echocardiogram --> LVEF 55, no RWMA, mild LVH, indeterminate diastolic fxn    Permanent atrial fibrillation Metoprolol on hold due to hypotension Not on long-term anticoagulation, continue to hold due to thrombocytopenia with increased risk of bleeding Heart rate had fairly well controlled, less than 100, occasionally higher Remains on telemetry   PVD S/p right BKA and left AKA Continue atorvastatin Plavix on hold due to thrombocytopenia    DVT prophylaxis: no Rx ppx d/t low Plt, no SCD d/t BKA bilaterally  Pertinent IV fluids/nutrition: no continuous IV fluids  Central lines / invasive devices: none  Code Status: DNR Family Communication: none (no NOK)  Disposition: inpatient TOC needs: back to ALF vs SNF Barriers to discharge / significant pending items: blood culture susceptibility resulted (+)staph epidermidis (S)Vanc which he has been getting, may need longer term IV abx pending repeat BCx and ID recs                Subjective:  Patient reports no chest pain or SOB, he states no other complaints.        Objective:  Vitals:   12/19/21 2308 12/20/21 0518 12/20/21 0744 12/20/21 1132  BP: (!) 101/59 (!) 93/58 108/66 106/71  Pulse: 77 84 (!) 101 81  Resp: 20 20 14 16   Temp: 98.9 F (37.2 C) 97.7 F (36.5 C) (!) 97.4 F (36.3 C) 97.7 F (36.5 C)  TempSrc: Oral  Axillary Oral  SpO2: 100% 100% 99% 100%  Weight:      Height:        Intake/Output Summary (Last 24 hours) at 12/20/2021 1302 Last data filed at 12/19/2021 1834 Gross per 24 hour  Intake --  Output 0 ml  Net 0 ml   Filed Weights   12/16/21 1238 12/18/21 0328   Weight: 96.2 kg 95.8 kg    Examination:  Constitutional:  VS as above General Appearance: alert, well-developed, obese, NAD Eyes: Normal lids and conjunctive, non-icteric sclera Neck: No masses, trachea midline Respiratory: Normal respiratory effort No wheeze Cardiovascular: S1/S2 normal + murmur Irreg/irreg Normal rate  No rub/gallop auscultated Gastrointestinal: No tenderness Musculoskeletal:  Symmetrical movement in all extremities R BKA, L AKA Neurological: No cranial nerve deficit on limited exam Alert Very hard of hearing  Psychiatric: Fair judgment/insight - difficult to communicate clearly d/t HoH Flat mood and affect       Scheduled Medications:   artificial tears   Both Eyes BID   atorvastatin  40 mg Oral QHS   calcitRIOL  0.25 mcg Oral Daily   calcium acetate  1,334 mg Oral TID   Chlorhexidine Gluconate Cloth  6 each Topical Q0600   cyanocobalamin  1,000 mcg Oral Daily   folic acid  1 mg Oral Daily   Gerhardt's butt cream   Topical Q4H   midodrine  20 mg Oral TID WC   pregabalin  75 mg Oral QHS   rOPINIRole  1 mg Oral QHS   sertraline  50 mg Oral Daily  sevelamer carbonate  1,600 mg Oral 3 times per day on Sun Mon Wed Fri    Continuous Infusions:  anticoagulant sodium citrate     vancomycin Stopped (12/18/21 2159)    PRN Medications:  acetaminophen, alteplase, anticoagulant sodium citrate, heparin, lactulose, lidocaine (PF), lidocaine-prilocaine, ondansetron **OR** ondansetron (ZOFRAN) IV, pentafluoroprop-tetrafluoroeth, polyvinyl alcohol  Antimicrobials:  Anti-infectives (From admission, onward)    Start     Dose/Rate Route Frequency Ordered Stop   12/18/21 2000  vancomycin (VANCOCIN) IVPB 1000 mg/200 mL premix        1,000 mg 200 mL/hr over 60 Minutes Intravenous Every T-Th-Sa (Hemodialysis) 12/17/21 1739     12/17/21 1830  vancomycin (VANCOREADY) IVPB 2000 mg/400 mL        2,000 mg 200 mL/hr over 120 Minutes Intravenous  Once  12/17/21 1738 12/17/21 2015       Data Reviewed: I have personally reviewed following labs and imaging studies  CBC: Recent Labs  Lab 12/16/21 1224 12/17/21 0501 12/18/21 0721 12/20/21 0746  WBC 6.7 4.7 4.2 6.1  NEUTROABS 3.9  --   --   --   HGB 12.0* 10.4* 10.3* 10.9*  HCT 42.1 36.3* 35.6* 37.4*  MCV 109.9* 111.0* 110.6* 109.0*  PLT 80* 63* 20* 53*   Basic Metabolic Panel: Recent Labs  Lab 12/16/21 1224 12/17/21 0501 12/18/21 0721 12/20/21 0746  NA 141 141 138 138  K 3.7 3.6 4.0 3.9  CL 103 101 101 102  CO2 28 28 27 28   GLUCOSE 101* 72 78 94  BUN 31* 19 28* 29*  CREATININE 3.89* 2.80* 3.59* 3.68*  CALCIUM 9.5 9.0 9.0 9.2   GFR: Estimated Creatinine Clearance: 13.3 mL/min (A) (by C-G formula based on SCr of 3.68 mg/dL (H)). Liver Function Tests: Recent Labs  Lab 12/16/21 1224  AST 16  ALT 7  ALKPHOS 70  BILITOT 1.3*  PROT 6.1*  ALBUMIN 3.4*   No results for input(s): "LIPASE", "AMYLASE" in the last 168 hours. Recent Labs  Lab 12/16/21 1224  AMMONIA 36*   Coagulation Profile: Recent Labs  Lab 12/16/21 1224  INR 1.4*   Cardiac Enzymes: No results for input(s): "CKTOTAL", "CKMB", "CKMBINDEX", "TROPONINI" in the last 168 hours. BNP (last 3 results) No results for input(s): "PROBNP" in the last 8760 hours. HbA1C: No results for input(s): "HGBA1C" in the last 72 hours. CBG: Recent Labs  Lab 12/17/21 0113 12/18/21 0250  GLUCAP 80 89   Lipid Profile: No results for input(s): "CHOL", "HDL", "LDLCALC", "TRIG", "CHOLHDL", "LDLDIRECT" in the last 72 hours. Thyroid Function Tests: No results for input(s): "TSH", "T4TOTAL", "FREET4", "T3FREE", "THYROIDAB" in the last 72 hours.  Anemia Panel: No results for input(s): "VITAMINB12", "FOLATE", "FERRITIN", "TIBC", "IRON", "RETICCTPCT" in the last 72 hours. Urine analysis:    Component Value Date/Time   COLORURINE RED (A) 12/04/2021 1316   APPEARANCEUR TURBID (A) 12/04/2021 1316   APPEARANCEUR Hazy  11/14/2012 2044   LABSPEC 1.012 12/04/2021 1316   LABSPEC 1.016 11/14/2012 2044   PHURINE  12/04/2021 1316    TEST NOT REPORTED DUE TO COLOR INTERFERENCE OF URINE PIGMENT   GLUCOSEU (A) 12/04/2021 1316    TEST NOT REPORTED DUE TO COLOR INTERFERENCE OF URINE PIGMENT   GLUCOSEU 150 mg/dL 11/14/2012 2044   HGBUR (A) 12/04/2021 1316    TEST NOT REPORTED DUE TO COLOR INTERFERENCE OF URINE PIGMENT   BILIRUBINUR (A) 12/04/2021 1316    TEST NOT REPORTED DUE TO COLOR INTERFERENCE OF URINE PIGMENT   BILIRUBINUR Negative 11/14/2012  2044   KETONESUR (A) 12/04/2021 1316    TEST NOT REPORTED DUE TO COLOR INTERFERENCE OF URINE PIGMENT   PROTEINUR (A) 12/04/2021 1316    TEST NOT REPORTED DUE TO COLOR INTERFERENCE OF URINE PIGMENT   NITRITE (A) 12/04/2021 1316    TEST NOT REPORTED DUE TO COLOR INTERFERENCE OF URINE PIGMENT   LEUKOCYTESUR (A) 12/04/2021 1316    TEST NOT REPORTED DUE TO COLOR INTERFERENCE OF URINE PIGMENT   LEUKOCYTESUR Trace 11/14/2012 2044   Sepsis Labs: @LABRCNTIP (procalcitonin:4,lacticidven:4)  Recent Results (from the past 240 hour(s))  Blood Culture (routine x 2)     Status: Abnormal   Collection Time: 12/16/21 12:24 PM   Specimen: BLOOD  Result Value Ref Range Status   Specimen Description   Final    BLOOD RIGHT [facility] Performed at [facility], [address] [place], [place] [address]    Special Requests   Final    BOTTLES DRAWN AEROBIC AND ANAEROBIC Blood Culture results may not be optimal due to an inadequate volume of blood received in culture bottles Performed at [facility], [address] [place], [place] [address]    Culture  Setup Time   Final    GRAM POSITIVE COCCI IN BOTH AEROBIC AND ANAEROBIC BOTTLES CRITICAL VALUE NOTED.  VALUE IS CONSISTENT WITH PREVIOUSLY REPORTED AND CALLED VALUE. Performed at [facility], [address] [place], [place] [address]    Culture (A)  Final    STAPHYLOCOCCUS EPIDERMIDIS SUSCEPTIBILITIES  PERFORMED ON PREVIOUS CULTURE WITHIN THE LAST 5 DAYS. Performed at [facility], [place] [address], [place], [place] [address]    Report Status 12/19/2021 FINAL  Final  Blood Culture (routine x 2)     Status: Abnormal   Collection Time: 12/16/21 12:24 PM   Specimen: BLOOD  Result Value Ref Range Status   Specimen Description   Final    BLOOD BLOOD RIGHT FOREARM Performed at [facility], [place] [address], [place], [place] [address]    Special Requests   Final    BOTTLES DRAWN AEROBIC AND ANAEROBIC Blood Culture results may not be optimal due to an inadequate volume of blood received in culture bottles Performed at [facility], [address] [place], [place] [address]    Culture  Setup Time   Final    GRAM POSITIVE COCCI IN BOTH AEROBIC AND ANAEROBIC BOTTLES Organism ID to follow CRITICAL RESULT CALLED TO, READ BACK BY AND VERIFIED WITH: JASON ROBBINS@0626  12/17/21 RH Performed at [facility], [address] [place], [place] [address]    Culture STAPHYLOCOCCUS EPIDERMIDIS (A)  Final   Report Status 12/19/2021 FINAL  Final   Organism ID, Bacteria STAPHYLOCOCCUS EPIDERMIDIS  Final      Susceptibility   Staphylococcus epidermidis - MIC*    CIPROFLOXACIN <=0.5 SENSITIVE Sensitive     ERYTHROMYCIN >=8 RESISTANT Resistant     GENTAMICIN <=0.5 SENSITIVE Sensitive     OXACILLIN >=4 RESISTANT Resistant     TETRACYCLINE <=1 SENSITIVE Sensitive     VANCOMYCIN 1 SENSITIVE Sensitive     TRIMETH/SULFA <=10 SENSITIVE Sensitive     CLINDAMYCIN >=8 RESISTANT Resistant     RIFAMPIN <=0.5 SENSITIVE Sensitive     Inducible Clindamycin NEGATIVE Sensitive     * STAPHYLOCOCCUS EPIDERMIDIS  Blood Culture ID Panel (Reflexed)     Status: Abnormal   Collection Time: 12/16/21 12:24 PM  Result Value Ref Range Status   Enterococcus faecalis NOT DETECTED NOT DETECTED Final  Enterococcus Faecium NOT DETECTED NOT DETECTED Final   Listeria monocytogenes NOT DETECTED NOT  DETECTED Final   Staphylococcus species DETECTED (A) NOT DETECTED Final    Comment: CRITICAL RESULT CALLED TO, READ BACK BY AND VERIFIED WITH: JASON ROBBINS@0626  12/17/21 RH    Staphylococcus aureus (BCID) NOT DETECTED NOT DETECTED Final   Staphylococcus epidermidis DETECTED (A) NOT DETECTED Final    Comment: Methicillin (oxacillin) resistant coagulase negative staphylococcus. Possible blood culture contaminant (unless isolated from more than one blood culture draw or clinical case suggests pathogenicity). No antibiotic treatment is indicated for blood  culture contaminants. CRITICAL RESULT CALLED TO, READ BACK BY AND VERIFIED WITH: JASON ROBBINS@0626  12/17/21 RH    Staphylococcus lugdunensis NOT DETECTED NOT DETECTED Final   Streptococcus species NOT DETECTED NOT DETECTED Final   Streptococcus agalactiae NOT DETECTED NOT DETECTED Final   Streptococcus pneumoniae NOT DETECTED NOT DETECTED Final   Streptococcus pyogenes NOT DETECTED NOT DETECTED Final   A.calcoaceticus-baumannii NOT DETECTED NOT DETECTED Final   Bacteroides fragilis NOT DETECTED NOT DETECTED Final   Enterobacterales NOT DETECTED NOT DETECTED Final   Enterobacter cloacae complex NOT DETECTED NOT DETECTED Final   Escherichia coli NOT DETECTED NOT DETECTED Final   Klebsiella aerogenes NOT DETECTED NOT DETECTED Final   Klebsiella oxytoca NOT DETECTED NOT DETECTED Final   Klebsiella pneumoniae NOT DETECTED NOT DETECTED Final   Proteus species NOT DETECTED NOT DETECTED Final   Salmonella species NOT DETECTED NOT DETECTED Final   Serratia marcescens NOT DETECTED NOT DETECTED Final   Haemophilus influenzae NOT DETECTED NOT DETECTED Final   Neisseria meningitidis NOT DETECTED NOT DETECTED Final   Pseudomonas aeruginosa NOT DETECTED NOT DETECTED Final   Stenotrophomonas maltophilia NOT DETECTED NOT DETECTED Final   Candida albicans NOT DETECTED NOT DETECTED Final   Candida auris NOT DETECTED NOT DETECTED Final   Candida  glabrata NOT DETECTED NOT DETECTED Final   Candida krusei NOT DETECTED NOT DETECTED Final   Candida parapsilosis NOT DETECTED NOT DETECTED Final   Candida tropicalis NOT DETECTED NOT DETECTED Final   Cryptococcus neoformans/gattii NOT DETECTED NOT DETECTED Final   Methicillin resistance mecA/C DETECTED (A) NOT DETECTED Final    Comment: CRITICAL RESULT CALLED TO, READ BACK BY AND VERIFIED WITH: JASON ROBBINS@0626  12/17/21 RH Performed at [facility], [address] [place], [place] [address]   Resp Panel by RT-PCR (Flu A&B, Covid) Anterior Nasal Swab     Status: None   Collection Time: 12/16/21  1:10 PM   Specimen: Anterior Nasal Swab  Result Value Ref Range Status   SARS Coronavirus 2 by RT PCR NEGATIVE NEGATIVE Final    Comment: (NOTE) SARS-CoV-2 target nucleic acids are NOT DETECTED.  The SARS-CoV-2 RNA is generally detectable in upper respiratory specimens during the acute phase of infection. The lowest concentration of SARS-CoV-2 viral copies this assay can detect is 138 copies/mL. A negative result does not preclude SARS-Cov-2 infection and should not be used as the sole basis for treatment or other patient management decisions. A negative result may occur with  improper specimen collection/handling, submission of specimen other than nasopharyngeal swab, presence of viral mutation(s) within the areas targeted by this assay, and inadequate number of viral copies(<138 copies/mL). A negative result must be combined with clinical observations, patient history, and epidemiological information. The expected result is Negative.  Fact Sheet for Patients:  EntrepreneurPulse.com.au  Fact Sheet for Healthcare Providers:  IncredibleEmployment.be  This test is no t yet approved or cleared by the [place] FDA  and  has been authorized for detection and/or diagnosis of SARS-CoV-2 by FDA under an Emergency Use Authorization (EUA). This  EUA will remain  in effect (meaning this test can be used) for the duration of the COVID-19 declaration under Section 564(b)(1) of the Act, 21 U.S.C.section 360bbb-3(b)(1), unless the authorization is terminated  or revoked sooner.       Influenza A by PCR NEGATIVE NEGATIVE Final   Influenza B by PCR NEGATIVE NEGATIVE Final    Comment: (NOTE) The Xpert Xpress SARS-CoV-2/FLU/RSV plus assay is intended as an aid in the diagnosis of influenza from Nasopharyngeal swab specimens and should not be used as a sole basis for treatment. Nasal washings and aspirates are unacceptable for Xpert Xpress SARS-CoV-2/FLU/RSV testing.  Fact Sheet for Patients: EntrepreneurPulse.com.au  Fact Sheet for Healthcare Providers: IncredibleEmployment.be  This test is not yet approved or cleared by the [place] FDA and has been authorized for detection and/or diagnosis of SARS-CoV-2 by FDA under an Emergency Use Authorization (EUA). This EUA will remain in effect (meaning this test can be used) for the duration of the COVID-19 declaration under Section 564(b)(1) of the Act, 21 U.S.C. section 360bbb-3(b)(1), unless the authorization is terminated or revoked.  Performed at [facility], [address] [place], [place] [address]   Culture, blood (Routine X 2) w Reflex to ID Panel     Status: None (Preliminary result)   Collection Time: 12/19/21  4:12 PM   Specimen: BLOOD RIGHT HAND  Result Value Ref Range Status   Specimen Description BLOOD RIGHT HAND  Final   Special Requests   Final    BOTTLES DRAWN AEROBIC AND ANAEROBIC Blood Culture adequate volume   Culture   Final    NO GROWTH < 24 HOURS Performed at [facility], [address], [place], [place] [address]    Report Status PENDING  Incomplete  Culture, blood (Routine X 2) w Reflex to ID Panel     Status: None (Preliminary result)   Collection Time: 12/19/21  4:18 PM   Specimen: BLOOD  RIGHT HAND  Result Value Ref Range Status   Specimen Description BLOOD RIGHT HAND  Final   Special Requests   Final    BOTTLES DRAWN AEROBIC AND ANAEROBIC Blood Culture adequate volume   Culture   Final    NO GROWTH < 24 HOURS Performed at [facility], [address], [place], [place] [address]    Report Status PENDING  Incomplete         Radiology Studies: ECHOCARDIOGRAM COMPLETE  Result Date: 12/17/2021    ECHOCARDIOGRAM REPORT   Patient Name:   MAAZ SPIERING [facility] Date of Exam: 12/17/2021 Medical Rec #:  [id number]          Height:       51.0 in Accession #:    [id number]         Weight:       212.0 lb Date of Birth:  1945/04/05          BSA:          1.700 m Patient Age:    58 years           BP:           90/77 mmHg Patient Gender: M                  HR:           89 bpm. Exam Location:  [facility] Procedure: 2D  Echo Indications:    chest pain  History:        Patient has prior history of Echocardiogram examinations, most                 recent 02/22/2021. CHF, Arrythmias:Atrial Fibrillation and                 Bradycardia; Signs/Symptoms:Shortness of Breath and Chest Pain.  Sonographer:    Harvie Junior Referring Phys: [phone] Minna Merritts  Sonographer Comments: Technically difficult study due to poor echo windows and patient is obese. Image acquisition challenging due to patient body habitus and supine. IMPRESSIONS  1. Left ventricular ejection fraction, by estimation, is 55%. The left ventricle has normal function. The left ventricle has no regional wall motion abnormalities. There is mild left ventricular hypertrophy. Left ventricular diastolic parameters are indeterminate.  2. Right ventricular systolic function is normal. The right ventricular size is moderately enlarged. There is moderately elevated pulmonary artery systolic pressure. The estimated right ventricular systolic pressure is 97.9 mmHg.  3. Left atrial size was mild to moderately dilated.  4. Right atrial size was mildly  dilated.  5. Moderate pleural effusion in the left lateral region.  6. The mitral valve is normal in structure. Mild mitral valve regurgitation. No evidence of mitral stenosis. Moderate mitral annular calcification.  7. The aortic valve is normal in structure. There is moderate calcification of the aortic valve. Aortic valve regurgitation is mild. Aortic valve sclerosis/calcification is present, with very mild aortic stenosis. Aortic valve mean gradient measures 12.5 mmHg. Aortic valve Vmax measures 2.87 m/s.  8. The inferior vena cava is normal in size with greater than 50% respiratory variability, suggesting right atrial pressure of 3 mmHg. FINDINGS  Left Ventricle: Left ventricular ejection fraction, by estimation, is 55 %. The left ventricle has normal function. The left ventricle has no regional wall motion abnormalities. The left ventricular internal cavity size was normal in size. There is mild  left ventricular hypertrophy. Left ventricular diastolic parameters are indeterminate. Right Ventricle: The right ventricular size is moderately enlarged. No increase in right ventricular wall thickness. Right ventricular systolic function is normal. There is moderately elevated pulmonary artery systolic pressure. The tricuspid regurgitant  velocity is 3.47 m/s, and with an assumed right atrial pressure of 10 mmHg, the estimated right ventricular systolic pressure is 89.2 mmHg. Left Atrium: Left atrial size was mild to moderately dilated. Right Atrium: Right atrial size was mildly dilated. Pericardium: There is no evidence of pericardial effusion. Mitral Valve: The mitral valve is normal in structure. Moderate mitral annular calcification. Mild mitral valve regurgitation. No evidence of mitral valve stenosis. Tricuspid Valve: The tricuspid valve is normal in structure. Tricuspid valve regurgitation is not demonstrated. No evidence of tricuspid stenosis. Aortic Valve: The aortic valve is normal in structure. There is  moderate calcification of the aortic valve. Aortic valve regurgitation is mild. Aortic valve sclerosis/calcification is present, without any evidence of aortic stenosis. Aortic valve mean gradient measures 12.5 mmHg. Aortic valve peak gradient measures 25.7 mmHg. Aortic valve area, by VTI measures 2.39 cm. Pulmonic Valve: The pulmonic valve was normal in structure. Pulmonic valve regurgitation is mild. No evidence of pulmonic stenosis. Aorta: The aortic root is normal in size and structure. Venous: The inferior vena cava is normal in size with greater than 50% respiratory variability, suggesting right atrial pressure of 3 mmHg. IAS/Shunts: No atrial level shunt detected by color flow Doppler. Additional Comments: There is a moderate pleural effusion in the  left lateral region.  LEFT VENTRICLE PLAX 2D LVIDd:         3.90 cm     Diastology LVIDs:         3.20 cm     LV e' medial:    8.92 cm/s LV PW:         1.20 cm     LV E/e' medial:  13.7 LV IVS:        1.20 cm     LV e' lateral:   12.60 cm/s LVOT diam:     1.90 cm     LV E/e' lateral: 9.7 LV SV:         98 LV SV Index:   57 LVOT Area:     2.84 cm  LV Volumes (MOD) LV vol d, MOD A2C: 67.8 ml LV vol d, MOD A4C: 63.4 ml LV vol s, MOD A2C: 35.1 ml LV vol s, MOD A4C: 28.4 ml LV SV MOD A2C:     32.7 ml LV SV MOD A4C:     63.4 ml LV SV MOD BP:      33.8 ml RIGHT VENTRICLE RV Basal diam:  4.70 cm RV Mid diam:    4.30 cm RV S prime:     5.44 cm/s TAPSE (M-mode): 1.2 cm LEFT ATRIUM             Index        RIGHT ATRIUM           Index LA diam:        3.90 cm 2.29 cm/m   RA Area:     27.00 cm LA Vol (A2C):   72.1 ml 42.40 ml/m  RA Volume:   84.20 ml  49.52 ml/m LA Vol (A4C):   77.5 ml 45.58 ml/m LA Biplane Vol: 82.7 ml 48.64 ml/m  AORTIC VALVE                     PULMONIC VALVE AV Area (Vmax):    2.16 cm      PV Vmax:          1.34 m/s AV Area (Vmean):   2.27 cm      PV Peak grad:     7.2 mmHg AV Area (VTI):     2.39 cm      PR End Diast Vel: 3.96 msec AV Vmax:            253.40 cm/s AV Vmean:          163.500 cm/s AV VTI:            0.409 m AV Peak Grad:      25.7 mmHg AV Mean Grad:      12.5 mmHg LVOT Vmax:         193.00 cm/s LVOT Vmean:        131.000 cm/s LVOT VTI:          0.344 m LVOT/AV VTI ratio: 0.84  AORTA Ao Root diam: 3.20 cm MITRAL VALVE                TRICUSPID VALVE MV Area (PHT): 4.49 cm     TR Peak grad:   48.2 mmHg MV Decel Time: 169 msec     TR Vmax:        347.00 cm/s MR Peak grad: 48.2 mmHg MR Vmax:      347.25 cm/s   SHUNTS MV E velocity: 122.00 cm/s  Systemic VTI:  0.34  m MV A velocity: 47.20 cm/s   Systemic Diam: 1.90 cm MV E/A ratio:  2.58 Ida Rogue MD Electronically signed by Ida Rogue MD Signature Date/Time: 12/17/2021/4:18:37 PM    Final             LOS: 4 days      Emeterio Reeve, DO [facility] 12/20/2021, 1:02 PM   Staff may message me via secure chat in [place]  but this may not receive immediate response,  please page for urgent matters!  If 7PM-7AM, please contact night-coverage [email or web address]  Dictation software was used to generate the above note. Typos may occur and escape review, as with typed/written notes. Please contact Dr Sheppard Coil directly for clarity if needed.

## 2021-12-21 DIAGNOSIS — J9602 Acute respiratory failure with hypercapnia: Secondary | ICD-10-CM | POA: Diagnosis not present

## 2021-12-21 DIAGNOSIS — G9340 Encephalopathy, unspecified: Secondary | ICD-10-CM | POA: Diagnosis not present

## 2021-12-21 DIAGNOSIS — B957 Other staphylococcus as the cause of diseases classified elsewhere: Secondary | ICD-10-CM | POA: Diagnosis not present

## 2021-12-21 DIAGNOSIS — G9341 Metabolic encephalopathy: Secondary | ICD-10-CM | POA: Diagnosis not present

## 2021-12-21 DIAGNOSIS — R7881 Bacteremia: Secondary | ICD-10-CM

## 2021-12-21 DIAGNOSIS — I959 Hypotension, unspecified: Secondary | ICD-10-CM | POA: Diagnosis not present

## 2021-12-21 DIAGNOSIS — J9621 Acute and chronic respiratory failure with hypoxia: Secondary | ICD-10-CM | POA: Diagnosis not present

## 2021-12-21 DIAGNOSIS — R0689 Other abnormalities of breathing: Secondary | ICD-10-CM | POA: Diagnosis not present

## 2021-12-21 LAB — CBC
HCT: 38.8 % — ABNORMAL LOW (ref 39.0–52.0)
Hemoglobin: 11.3 g/dL — ABNORMAL LOW (ref 13.0–17.0)
MCH: 31.4 pg (ref 26.0–34.0)
MCHC: 29.1 g/dL — ABNORMAL LOW (ref 30.0–36.0)
MCV: 107.8 fL — ABNORMAL HIGH (ref 80.0–100.0)
Platelets: 44 10*3/uL — ABNORMAL LOW (ref 150–400)
RBC: 3.6 MIL/uL — ABNORMAL LOW (ref 4.22–5.81)
RDW: 20.2 % — ABNORMAL HIGH (ref 11.5–15.5)
WBC: 4.5 10*3/uL (ref 4.0–10.5)
nRBC: 0 % (ref 0.0–0.2)

## 2021-12-21 MED ORDER — VITAMIN C 500 MG PO TABS
500.0000 mg | ORAL_TABLET | Freq: Two times a day (BID) | ORAL | Status: DC
  Administered 2021-12-21 – 2021-12-22 (×2): 500 mg via ORAL
  Filled 2021-12-21 (×2): qty 1

## 2021-12-21 MED ORDER — VANCOMYCIN HCL IN DEXTROSE 1-5 GM/200ML-% IV SOLN: 1000.0000 mg | INTRAVENOUS | Status: DC

## 2021-12-21 MED ORDER — DICYCLOMINE HCL 20 MG PO TABS
20.0000 mg | ORAL_TABLET | Freq: Once | ORAL | Status: AC
  Administered 2021-12-21: 20 mg via ORAL
  Filled 2021-12-21: qty 1

## 2021-12-21 MED ORDER — RENA-VITE PO TABS
1.0000 | ORAL_TABLET | Freq: Every day | ORAL | Status: DC
  Administered 2021-12-21: 1 via ORAL
  Filled 2021-12-21: qty 1

## 2021-12-21 NOTE — Progress Notes (Signed)
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 He resides at [facility].  10/12: Patient was found to be in uncompensated respiratory stenosis with hypercapnia and was placed on BiPAP. Fluid overloaded d/t acute on chronic CHF. Underwent HD.  10/13: Seen by cardiology - advised continue HD. Repeat Echo. Refusing BiPap. Remains on [place] O2 at 3L. Difficult HD today d/t hypotension. Needing albumin for BP support. Palliative care consulted. No CPR/intubation. BCx from 10/12 (+)staph epidermidis methicillin resistance. Started on vancomycin 10/13 10/14: cardio s/o.  10/15: repeat BCx ordered. S/S back on staph bacteremia. Continue vanc for now. Consult ID 10/16: lethargic this AM requiring to be placed back on BiPap, improved on this. ID to see today, continue vancomycin pending any further ID recs. 10/17: BCx resulted this afternoon negative, can continue vancomycin outpatient pending confirmation w/ ID, TOC to confirm abx and BiPap equipment       Consultants:  Nephrology  Cardiology  Palliative care   Procedures: Hemodialysis   Antibiotics: Vancomycin 12/17/2021 - present     ASSESSMENT & PLAN:   Principal Problem:   Acute metabolic encephalopathy Active Problems:   Hypotension   Sinus bradycardia   Acute on chronic respiratory failure with hypoxia and hypercapnia (HCC)   Acute on chronic  combined systolic and diastolic CHF (congestive heart failure) (HCC)   ESRD on hemodialysis (HCC)   Pressure injury of skin   Thrombocytopenia (HCC)   PVD (peripheral vascular disease) (HCC)   Permanent atrial fibrillation (HCC)   Morbid obesity (HCC)   Elevated troponin   Acute respiratory failure with hypercapnia (HCC)   CO2 narcosis   Acute metabolic encephalopathy - resolved Secondary to uncompensated respiratory acidosis with hypercapnia Mental status had improved but waxes/wanes, improved w/ BiPap   Acute on chronic respiratory failure with hypoxia and hypercapnia Uncompensated respiratory acidosis on ABG Likely d/t fluid overload acute on chronic HFrEF  At baseline he wears 2 L of oxygen continuously Started on BiPAP in the ED BiPAP has been turned off now as patient could not tolerate it, placed back on oxygen via nasal cannula 3 L/min Wean to baseline O2 as tolerated Has been needing BiPap qhs - may need this outpatient    Bacteremia vs BCx contaminant - 2 cultures (+)Staph Epidermidis Blood cultures growing Staph epidermidis could be contamination, however patient was hypotensive 2 BCx positive. Staph w/ methicillin resistance, also resistant to clindamycin, erythromycin, oxacillin Started 10/13 on vancomycin  final culture result --> (S) Vancomycin  ID consult --> Likely contaminant. Repeat blood culture sent 10/15 --> negative -->  will DC vanco after completion of 7 days    Hypotension Patient has been on midodrine 10 mg 3 times daily --> Dose of midodrine changed to 20 mg p.o. 3 times daily   Acute on chronic systolic CHF / HFrEF Chest x-ray showed bilateral pleural effusions with pulmonary vascular congestion LVEF 40 to 45% as per echo  from December 2020  Continue renal replacement therapy Net IO Since Admission: -3,179 mL [12/21/21 1740]   ESRD ESRD and is on hemodialysis Tuesday Thursday and Saturday history of noncompliance with dialysis Nephrology  following    Pressure injury of the skin present on admission Patient has unstageable pressure ulcers over the gluteal region Wound care    Thrombocytopenia Chronic Continue NO anticoagulation for Afib, no Plavix for PAD Repeat CBC CLose monitor for bleeding, low threshold for transfusion   Mild elevation of troponin Likely due to demand ischemia in the setting of acute on chronic systolic CHF Echocardiogram --> LVEF 55, no RWMA, mild LVH, indeterminate diastolic fxn    Permanent atrial fibrillation Metoprolol on hold due to hypotension Not on long-term anticoagulation, continue to hold due to thrombocytopenia with increased risk of bleeding Heart rate had fairly well controlled, less than 100, occasionally higher Remains on telemetry   PVD S/p right BKA and left AKA Continue atorvastatin Plavix on hold due to thrombocytopenia    DVT prophylaxis: no Rx ppx d/t low Plt, no SCD d/t BKA bilaterally  Pertinent IV fluids/nutrition: no continuous IV fluids  Central lines / invasive devices: none  Code Status: DNR Family Communication: none (no NOK)  Disposition: inpatient TOC needs: back to SNF, IV abx, BiPap DME Barriers to discharge / significant pending items: BCx have been pending, back today, will work to confirm can get vancomycin at dialysis or SNF and get BIPap at SNF               Subjective:  Patient reports no chest pain or SOB, he states no other complaints.        Objective:  Vitals:   12/21/21 1315 12/21/21 1324 12/21/21 1329 12/21/21 1406  BP: (!) 102/59 96/61 (!) 101/56 102/70  Pulse: 78 86 86 75  Resp: 14 13 18 16   Temp:  98.1 F (36.7 C)  97.8 F (36.6 C)  TempSrc:  Oral  Oral  SpO2: 99% 98% 99% 100%  Weight:   92.2 kg   Height:        Intake/Output Summary (Last 24 hours) at 12/21/2021 1741 Last data filed at 12/21/2021 1329 Gross per 24 hour  Intake --  Output 2000 ml  Net -2000 ml   Filed Weights   12/18/21 0328  12/21/21 0943 12/21/21 1329  Weight: 95.8 kg 94.5 kg 92.2 kg    Examination:  Constitutional:  VS as above General Appearance: alert, well-developed, obese, NAD Eyes: Normal lids and conjunctive, non-icteric sclera Neck: No masses, trachea midline Respiratory: Normal respiratory effort No wheeze Cardiovascular: S1/S2 normal + murmur Irreg/irreg Normal rate  No rub/gallop auscultated Gastrointestinal: No tenderness Musculoskeletal:  Symmetrical movement in all extremities R BKA, L AKA Neurological: No cranial nerve deficit on limited exam Alert Very hard of hearing  Psychiatric: Fair judgment/insight - difficult to communicate clearly d/t HoH Flat mood and affect       Scheduled Medications:   artificial tears   Both Eyes BID   vitamin C  500 mg Oral BID   atorvastatin  40 mg Oral QHS   calcitRIOL  0.25 mcg Oral Daily   calcium acetate  1,334 mg Oral TID   Chlorhexidine Gluconate Cloth  6 each Topical Q0600   cyanocobalamin  1,000 mcg Oral Daily   folic acid  1 mg Oral Daily   Gerhardt's butt cream   Topical Q4H   midodrine  20 mg Oral TID WC   multivitamin  1 tablet  Oral QHS   pregabalin  75 mg Oral QHS   rOPINIRole  1 mg Oral QHS   sertraline  50 mg Oral Daily   sevelamer carbonate  1,600 mg Oral 3 times per day on Sun Mon Wed Fri    Continuous Infusions:  anticoagulant sodium citrate     [START ON 12/23/2021] vancomycin      PRN Medications:  acetaminophen, alteplase, anticoagulant sodium citrate, heparin, lactulose, lidocaine (PF), lidocaine-prilocaine, ondansetron **OR** ondansetron (ZOFRAN) IV, pentafluoroprop-tetrafluoroeth, polyvinyl alcohol  Antimicrobials:  Anti-infectives (From admission, onward)    Start     Dose/Rate Route Frequency Ordered Stop   12/23/21 1200  vancomycin (VANCOCIN) IVPB 1000 mg/200 mL premix        1,000 mg 200 mL/hr over 60 Minutes Intravenous Every T-Th-Sa (Hemodialysis) 12/21/21 1627 12/25/21 1159   12/18/21  2000  vancomycin (VANCOCIN) IVPB 1000 mg/200 mL premix  Status:  Discontinued        1,000 mg 200 mL/hr over 60 Minutes Intravenous Every T-Th-Sa (Hemodialysis) 12/17/21 1739 12/21/21 1627   12/17/21 1830  vancomycin (VANCOREADY) IVPB 2000 mg/400 mL        2,000 mg 200 mL/hr over 120 Minutes Intravenous  Once 12/17/21 1738 12/17/21 2015       Data Reviewed: I have personally reviewed following labs and imaging studies  CBC: Recent Labs  Lab 12/16/21 1224 12/17/21 0501 12/18/21 0721 12/20/21 0746 12/21/21 0630  WBC 6.7 4.7 4.2 6.1 4.5  NEUTROABS 3.9  --   --   --   --   HGB 12.0* 10.4* 10.3* 10.9* 11.3*  HCT 42.1 36.3* 35.6* 37.4* 38.8*  MCV 109.9* 111.0* 110.6* 109.0* 107.8*  PLT 80* 63* 20* 53* 44*   Basic Metabolic Panel: Recent Labs  Lab 12/16/21 1224 12/17/21 0501 12/18/21 0721 12/20/21 0746  NA 141 141 138 138  K 3.7 3.6 4.0 3.9  CL 103 101 101 102  CO2 28 28 27 28   GLUCOSE 101* 72 78 94  BUN 31* 19 28* 29*  CREATININE 3.89* 2.80* 3.59* 3.68*  CALCIUM 9.5 9.0 9.0 9.2   GFR: Estimated Creatinine Clearance: 13 mL/min (A) (by C-G formula based on SCr of 3.68 mg/dL (H)). Liver Function Tests: Recent Labs  Lab 12/16/21 1224  AST 16  ALT 7  ALKPHOS 70  BILITOT 1.3*  PROT 6.1*  ALBUMIN 3.4*   No results for input(s): "LIPASE", "AMYLASE" in the last 168 hours. Recent Labs  Lab 12/16/21 1224  AMMONIA 36*   Coagulation Profile: Recent Labs  Lab 12/16/21 1224  INR 1.4*   Cardiac Enzymes: No results for input(s): "CKTOTAL", "CKMB", "CKMBINDEX", "TROPONINI" in the last 168 hours. BNP (last 3 results) No results for input(s): "PROBNP" in the last 8760 hours. HbA1C: No results for input(s): "HGBA1C" in the last 72 hours. CBG: Recent Labs  Lab 12/17/21 0113 12/18/21 0250 12/20/21 2041  GLUCAP 80 89 108*   Lipid Profile: No results for input(s): "CHOL", "HDL", "LDLCALC", "TRIG", "CHOLHDL", "LDLDIRECT" in the last 72 hours. Thyroid Function  Tests: No results for input(s): "TSH", "T4TOTAL", "FREET4", "T3FREE", "THYROIDAB" in the last 72 hours.  Anemia Panel: No results for input(s): "VITAMINB12", "FOLATE", "FERRITIN", "TIBC", "IRON", "RETICCTPCT" in the last 72 hours. Urine analysis:    Component Value Date/Time   COLORURINE RED (A) 12/04/2021 1316   APPEARANCEUR TURBID (A) 12/04/2021 1316   APPEARANCEUR Hazy 11/14/2012 2044   LABSPEC 1.012 12/04/2021 1316   LABSPEC 1.016 11/14/2012 2044   PHURINE  12/04/2021 1316  TEST NOT REPORTED DUE TO COLOR INTERFERENCE OF URINE PIGMENT   GLUCOSEU (A) 12/04/2021 1316    TEST NOT REPORTED DUE TO COLOR INTERFERENCE OF URINE PIGMENT   GLUCOSEU 150 mg/dL 11/14/2012 2044   HGBUR (A) 12/04/2021 1316    TEST NOT REPORTED DUE TO COLOR INTERFERENCE OF URINE PIGMENT   BILIRUBINUR (A) 12/04/2021 1316    TEST NOT REPORTED DUE TO COLOR INTERFERENCE OF URINE PIGMENT   BILIRUBINUR Negative 11/14/2012 2044   KETONESUR (A) 12/04/2021 1316    TEST NOT REPORTED DUE TO COLOR INTERFERENCE OF URINE PIGMENT   PROTEINUR (A) 12/04/2021 1316    TEST NOT REPORTED DUE TO COLOR INTERFERENCE OF URINE PIGMENT   NITRITE (A) 12/04/2021 1316    TEST NOT REPORTED DUE TO COLOR INTERFERENCE OF URINE PIGMENT   LEUKOCYTESUR (A) 12/04/2021 1316    TEST NOT REPORTED DUE TO COLOR INTERFERENCE OF URINE PIGMENT   LEUKOCYTESUR Trace 11/14/2012 2044   Sepsis Labs: @LABRCNTIP (procalcitonin:4,lacticidven:4)  Recent Results (from the past 240 hour(s))  Blood Culture (routine x 2)     Status: Abnormal   Collection Time: 12/16/21 12:24 PM   Specimen: BLOOD  Result Value Ref Range Status   Specimen Description   Final    BLOOD RIGHT [facility] Performed at [facility], [address], [place], [place] [address]    Special Requests   Final    BOTTLES DRAWN AEROBIC AND ANAEROBIC Blood Culture results may not be optimal due to an inadequate volume of blood received in culture bottles Performed at [facility], [address] [place], [place] [address]    Culture  Setup Time   Final    GRAM POSITIVE COCCI IN BOTH AEROBIC AND ANAEROBIC BOTTLES CRITICAL VALUE NOTED.  VALUE IS CONSISTENT WITH PREVIOUSLY REPORTED AND CALLED VALUE. Performed at [facility], [address] [place], [place] [address]    Culture (A)  Final    STAPHYLOCOCCUS EPIDERMIDIS SUSCEPTIBILITIES PERFORMED ON PREVIOUS CULTURE WITHIN THE LAST 5 DAYS. Performed at  [facility], [place] [address], [place], [place] [address]    Report Status 12/19/2021 FINAL  Final  Blood Culture (routine x 2)     Status: Abnormal   Collection Time: 12/16/21 12:24 PM   Specimen: BLOOD  Result Value Ref Range Status   Specimen Description   Final    BLOOD BLOOD RIGHT FOREARM Performed at [facility], [place] [address], [place], [place] [address]    Special Requests   Final    BOTTLES DRAWN AEROBIC AND ANAEROBIC Blood Culture results may not be optimal due to an inadequate volume of blood received in culture bottles Performed at [facility], [address] [place], [place] [address]    Culture  Setup Time   Final    GRAM POSITIVE COCCI IN BOTH AEROBIC AND ANAEROBIC BOTTLES Organism ID to follow CRITICAL RESULT CALLED TO, READ BACK BY AND VERIFIED WITH: JASON ROBBINS@0626  12/17/21 RH Performed at [facility], [address] [place], [place] [address]    Culture STAPHYLOCOCCUS EPIDERMIDIS (A)  Final   Report Status 12/19/2021 FINAL  Final   Organism ID, Bacteria STAPHYLOCOCCUS EPIDERMIDIS  Final      Susceptibility   Staphylococcus epidermidis - MIC*    CIPROFLOXACIN <=0.5 SENSITIVE Sensitive     ERYTHROMYCIN >=8 RESISTANT Resistant     GENTAMICIN <=0.5 SENSITIVE Sensitive     OXACILLIN >=4 RESISTANT Resistant     TETRACYCLINE <=1 SENSITIVE Sensitive  VANCOMYCIN 1 SENSITIVE Sensitive     TRIMETH/SULFA <=10 SENSITIVE Sensitive     CLINDAMYCIN >=8 RESISTANT Resistant     RIFAMPIN <=0.5  SENSITIVE Sensitive     Inducible Clindamycin NEGATIVE Sensitive     * STAPHYLOCOCCUS EPIDERMIDIS  Blood Culture ID Panel (Reflexed)     Status: Abnormal   Collection Time: 12/16/21 12:24 PM  Result Value Ref Range Status   Enterococcus faecalis NOT DETECTED NOT DETECTED Final   Enterococcus Faecium NOT DETECTED NOT DETECTED Final   Listeria monocytogenes NOT DETECTED NOT DETECTED Final   Staphylococcus species DETECTED (A) NOT DETECTED Final    Comment: CRITICAL RESULT CALLED TO, READ BACK BY AND VERIFIED WITH: JASON ROBBINS@0626  12/17/21 RH    Staphylococcus aureus (BCID) NOT DETECTED NOT DETECTED Final   Staphylococcus epidermidis DETECTED (A) NOT DETECTED Final    Comment: Methicillin (oxacillin) resistant coagulase negative staphylococcus. Possible blood culture contaminant (unless isolated from more than one blood culture draw or clinical case suggests pathogenicity). No antibiotic treatment is indicated for blood  culture contaminants. CRITICAL RESULT CALLED TO, READ BACK BY AND VERIFIED WITH: JASON ROBBINS@0626  12/17/21 RH    Staphylococcus lugdunensis NOT DETECTED NOT DETECTED Final   Streptococcus species NOT DETECTED NOT DETECTED Final   Streptococcus agalactiae NOT DETECTED NOT DETECTED Final   Streptococcus pneumoniae NOT DETECTED NOT DETECTED Final   Streptococcus pyogenes NOT DETECTED NOT DETECTED Final   A.calcoaceticus-baumannii NOT DETECTED NOT DETECTED Final   Bacteroides fragilis NOT DETECTED NOT DETECTED Final   Enterobacterales NOT DETECTED NOT DETECTED Final   Enterobacter cloacae complex NOT DETECTED NOT DETECTED Final   Escherichia coli NOT DETECTED NOT DETECTED Final   Klebsiella aerogenes NOT DETECTED NOT DETECTED Final   Klebsiella oxytoca NOT DETECTED NOT DETECTED Final   Klebsiella pneumoniae NOT DETECTED NOT DETECTED Final   Proteus species NOT DETECTED NOT DETECTED Final   Salmonella species NOT DETECTED NOT DETECTED Final   Serratia marcescens NOT  DETECTED NOT DETECTED Final   Haemophilus influenzae NOT DETECTED NOT DETECTED Final   Neisseria meningitidis NOT DETECTED NOT DETECTED Final   Pseudomonas aeruginosa NOT DETECTED NOT DETECTED Final   Stenotrophomonas maltophilia NOT DETECTED NOT DETECTED Final   Candida albicans NOT DETECTED NOT DETECTED Final   Candida auris NOT DETECTED NOT DETECTED Final   Candida glabrata NOT DETECTED NOT DETECTED Final   Candida krusei NOT DETECTED NOT DETECTED Final   Candida parapsilosis NOT DETECTED NOT DETECTED Final   Candida tropicalis NOT DETECTED NOT DETECTED Final   Cryptococcus neoformans/gattii NOT DETECTED NOT DETECTED Final   Methicillin resistance mecA/C DETECTED (A) NOT DETECTED Final    Comment: CRITICAL RESULT CALLED TO, READ BACK BY AND VERIFIED WITH: JASON ROBBINS@0626  12/17/21 RH Performed at [facility], [address] [place], [place] [address]   Resp Panel by RT-PCR (Flu A&B, Covid) Anterior Nasal Swab     Status: None   Collection Time: 12/16/21  1:10 PM   Specimen: Anterior Nasal Swab  Result Value Ref Range Status   SARS Coronavirus 2 by RT PCR NEGATIVE NEGATIVE Final    Comment: (NOTE) SARS-CoV-2 target nucleic acids are NOT DETECTED.  The SARS-CoV-2 RNA is generally detectable in upper respiratory specimens during the acute phase of infection. The lowest concentration of SARS-CoV-2 viral copies this assay can detect is 138 copies/mL. A negative result does not preclude SARS-Cov-2 infection and should not be used as the sole basis for treatment or other patient management decisions. A negative result may occur  with  improper specimen collection/handling, submission of specimen other than nasopharyngeal swab, presence of viral mutation(s) within the areas targeted by this assay, and inadequate number of viral copies(<138 copies/mL). A negative result must be combined with clinical observations, patient history, and epidemiological information. The  expected result is Negative.  Fact Sheet for Patients:  EntrepreneurPulse.com.au  Fact Sheet for Healthcare Providers:  IncredibleEmployment.be  This test is no t yet approved or cleared by the [place] FDA and  has been authorized for detection and/or diagnosis of SARS-CoV-2 by FDA under an Emergency Use Authorization (EUA). This EUA will remain  in effect (meaning this test can be used) for the duration of the COVID-19 declaration under Section 564(b)(1) of the Act, 21 U.S.C.section 360bbb-3(b)(1), unless the authorization is terminated  or revoked sooner.       Influenza A by PCR NEGATIVE NEGATIVE Final   Influenza B by PCR NEGATIVE NEGATIVE Final    Comment: (NOTE) The Xpert Xpress SARS-CoV-2/FLU/RSV plus assay is intended as an aid in the diagnosis of influenza from Nasopharyngeal swab specimens and should not be used as a sole basis for treatment. Nasal washings and aspirates are unacceptable for Xpert Xpress SARS-CoV-2/FLU/RSV testing.  Fact Sheet for Patients: EntrepreneurPulse.com.au  Fact Sheet for Healthcare Providers: IncredibleEmployment.be  This test is not yet approved or cleared by the [place] FDA and has been authorized for detection and/or diagnosis of SARS-CoV-2 by FDA under an Emergency Use Authorization (EUA). This EUA will remain in effect (meaning this test can be used) for the duration of the COVID-19 declaration under Section 564(b)(1) of the Act, 21 U.S.C. section 360bbb-3(b)(1), unless the authorization is terminated or revoked.  Performed at [facility], [address] [place], [place] [address]   Culture, blood (Routine X 2) w Reflex to ID Panel     Status: None (Preliminary result)   Collection Time: 12/19/21  4:12 PM   Specimen: BLOOD RIGHT HAND  Result Value Ref Range Status   Specimen Description BLOOD RIGHT HAND  Final   Special Requests    Final    BOTTLES DRAWN AEROBIC AND ANAEROBIC Blood Culture adequate volume   Culture   Final    NO GROWTH 2 DAYS Performed at [facility], [address] [place], [place] [address]    Report Status PENDING  Incomplete  Culture, blood (Routine X 2) w Reflex to ID Panel     Status: None (Preliminary result)   Collection Time: 12/19/21  4:18 PM   Specimen: BLOOD RIGHT HAND  Result Value Ref Range Status   Specimen Description BLOOD RIGHT HAND  Final   Special Requests   Final    BOTTLES DRAWN AEROBIC AND ANAEROBIC Blood Culture adequate volume   Culture   Final    NO GROWTH 2 DAYS Performed at [facility], [address] [place], [place] [address]    Report Status PENDING  Incomplete         Radiology Studies: ECHOCARDIOGRAM COMPLETE  Result Date: 12/17/2021    ECHOCARDIOGRAM REPORT   Patient Name:   Antonio Phillips [facility] Date of Exam: 12/17/2021 Medical Rec #:  [id number]          Height:       51.0 in Accession #:    [id number]         Weight:       212.0 lb Date of Birth:  January 13, 1946          BSA:  1.700 m Patient Age:    36 years           BP:           90/77 mmHg Patient Gender: M                  HR:           89 bpm. Exam Location:  [facility] Procedure: 2D Echo Indications:    chest pain  History:        Patient has prior history of Echocardiogram examinations, most                 recent 02/22/2021. CHF, Arrythmias:Atrial Fibrillation and                 Bradycardia; Signs/Symptoms:Shortness of Breath and Chest Pain.  Sonographer:    Harvie Junior Referring Phys: [phone] Minna Merritts  Sonographer Comments: Technically difficult study due to poor echo windows and patient is obese. Image acquisition challenging due to patient body habitus and supine. IMPRESSIONS  1. Left ventricular ejection fraction, by estimation, is 55%. The left ventricle has normal function. The left ventricle has no regional wall motion abnormalities. There is mild left ventricular  hypertrophy. Left ventricular diastolic parameters are indeterminate.  2. Right ventricular systolic function is normal. The right ventricular size is moderately enlarged. There is moderately elevated pulmonary artery systolic pressure. The estimated right ventricular systolic pressure is 61.6 mmHg.  3. Left atrial size was mild to moderately dilated.  4. Right atrial size was mildly dilated.  5. Moderate pleural effusion in the left lateral region.  6. The mitral valve is normal in structure. Mild mitral valve regurgitation. No evidence of mitral stenosis. Moderate mitral annular calcification.  7. The aortic valve is normal in structure. There is moderate calcification of the aortic valve. Aortic valve regurgitation is mild. Aortic valve sclerosis/calcification is present, with very mild aortic stenosis. Aortic valve mean gradient measures 12.5 mmHg. Aortic valve Vmax measures 2.87 m/s.  8. The inferior vena cava is normal in size with greater than 50% respiratory variability, suggesting right atrial pressure of 3 mmHg. FINDINGS  Left Ventricle: Left ventricular ejection fraction, by estimation, is 55 %. The left ventricle has normal function. The left ventricle has no regional wall motion abnormalities. The left ventricular internal cavity size was normal in size. There is mild  left ventricular hypertrophy. Left ventricular diastolic parameters are indeterminate. Right Ventricle: The right ventricular size is moderately enlarged. No increase in right ventricular wall thickness. Right ventricular systolic function is normal. There is moderately elevated pulmonary artery systolic pressure. The tricuspid regurgitant  velocity is 3.47 m/s, and with an assumed right atrial pressure of 10 mmHg, the estimated right ventricular systolic pressure is 07.3 mmHg. Left Atrium: Left atrial size was mild to moderately dilated. Right Atrium: Right atrial size was mildly dilated. Pericardium: There is no evidence of pericardial  effusion. Mitral Valve: The mitral valve is normal in structure. Moderate mitral annular calcification. Mild mitral valve regurgitation. No evidence of mitral valve stenosis. Tricuspid Valve: The tricuspid valve is normal in structure. Tricuspid valve regurgitation is not demonstrated. No evidence of tricuspid stenosis. Aortic Valve: The aortic valve is normal in structure. There is moderate calcification of the aortic valve. Aortic valve regurgitation is mild. Aortic valve sclerosis/calcification is present, without any evidence of aortic stenosis. Aortic valve mean gradient measures 12.5 mmHg. Aortic valve peak gradient measures 25.7 mmHg. Aortic valve area, by VTI measures 2.39 cm.  Pulmonic Valve: The pulmonic valve was normal in structure. Pulmonic valve regurgitation is mild. No evidence of pulmonic stenosis. Aorta: The aortic root is normal in size and structure. Venous: The inferior vena cava is normal in size with greater than 50% respiratory variability, suggesting right atrial pressure of 3 mmHg. IAS/Shunts: No atrial level shunt detected by color flow Doppler. Additional Comments: There is a moderate pleural effusion in the left lateral region.  LEFT VENTRICLE PLAX 2D LVIDd:         3.90 cm     Diastology LVIDs:         3.20 cm     LV e' medial:    8.92 cm/s LV PW:         1.20 cm     LV E/e' medial:  13.7 LV IVS:        1.20 cm     LV e' lateral:   12.60 cm/s LVOT diam:     1.90 cm     LV E/e' lateral: 9.7 LV SV:         98 LV SV Index:   57 LVOT Area:     2.84 cm  LV Volumes (MOD) LV vol d, MOD A2C: 67.8 ml LV vol d, MOD A4C: 63.4 ml LV vol s, MOD A2C: 35.1 ml LV vol s, MOD A4C: 28.4 ml LV SV MOD A2C:     32.7 ml LV SV MOD A4C:     63.4 ml LV SV MOD BP:      33.8 ml RIGHT VENTRICLE RV Basal diam:  4.70 cm RV Mid diam:    4.30 cm RV S prime:     5.44 cm/s TAPSE (M-mode): 1.2 cm LEFT ATRIUM             Index        RIGHT ATRIUM           Index LA diam:        3.90 cm 2.29 cm/m   RA Area:     27.00 cm  LA Vol (A2C):   72.1 ml 42.40 ml/m  RA Volume:   84.20 ml  49.52 ml/m LA Vol (A4C):   77.5 ml 45.58 ml/m LA Biplane Vol: 82.7 ml 48.64 ml/m  AORTIC VALVE                     PULMONIC VALVE AV Area (Vmax):    2.16 cm      PV Vmax:          1.34 m/s AV Area (Vmean):   2.27 cm      PV Peak grad:     7.2 mmHg AV Area (VTI):     2.39 cm      PR End Diast Vel: 3.96 msec AV Vmax:           253.40 cm/s AV Vmean:          163.500 cm/s AV VTI:            0.409 m AV Peak Grad:      25.7 mmHg AV Mean Grad:      12.5 mmHg LVOT Vmax:         193.00 cm/s LVOT Vmean:        131.000 cm/s LVOT VTI:          0.344 m LVOT/AV VTI ratio: 0.84  AORTA Ao Root diam: 3.20 cm MITRAL VALVE  TRICUSPID VALVE MV Area (PHT): 4.49 cm     TR Peak grad:   48.2 mmHg MV Decel Time: 169 msec     TR Vmax:        347.00 cm/s MR Peak grad: 48.2 mmHg MR Vmax:      347.25 cm/s   SHUNTS MV E velocity: 122.00 cm/s  Systemic VTI:  0.34 m MV A velocity: 47.20 cm/s   Systemic Diam: 1.90 cm MV E/A ratio:  2.58 Ida Rogue MD Electronically signed by Ida Rogue MD Signature Date/Time: 12/17/2021/4:18:37 PM    Final             LOS: 5 days      Emeterio Reeve, DO [facility] 12/21/2021, 5:41 PM   Staff may message me via secure chat in [place]  but this may not receive immediate response,  please page for urgent matters!  If 7PM-7AM, please contact night-coverage [email or web address]  Dictation software was used to generate the above note. Typos may occur and escape review, as with typed/written notes. Please contact Dr Sheppard Coil directly for clarity if needed.

## 2021-12-21 NOTE — Progress Notes (Signed)
Established hemodialysis patient known at [facility] 11:40am. Patient is a long term resident at [facility]. Patient stated no dialysis concerns.

## 2021-12-21 NOTE — Progress Notes (Signed)
Initial Nutrition Assessment  DOCUMENTATION CODES:   Morbid obesity  INTERVENTION:   -Renal MVI daily -Double protein portions with meals -500 mg vitamin C BID  NUTRITION DIAGNOSIS:   Increased nutrient needs related to wound healing as evidenced by estimated needs.  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 He resides at [facility].  Pt admitted with acute metabolic encephalopathy.   Per ID notes, pt with MRSE bacteremia; currently on vancomycin.    Pt unavailable at time of visit. Pt down in HD suite at time of visit. RD unable to obtain further nutrition-related history or complete nutrition-focused physical exam at this time.    Pt currently on a dysphagia 2 diet. Pt tolerating meals well. Noted meal completions 85%.   Reviewed wt hx; pt has experienced a 10.6% wt loss over the past 6 months, which is significant for time frame. Suspect some wt loss may be related to fluid changes from HD. Unsure of EDW at this time.   Per [facility] notes, pt with DPTI to lt sacrum. Pt would benefit from addition of oral nutrition supplements secondary to increased nutritional needs for wound healing.   Per palliative care notes, plan for outpatient palliative care follow-up. Pt desires to treat the treatable, with no CPR or intubation.   Medications reviewed and include calcium acetate, vitamin K-80, folic acid, and renvela.   Per TOC notes, plan to d/c back to  [facility] once medically stable (pt is a long term resident).   Lab Results  Component Value Date   HGBA1C 5.5 11/10/2018   PTA DM medications are none.   Labs reviewed: CBGS: 108 (inpatient orders for glycemic control are none).    Diet Order:   Diet Order             DIET DYS 2 Room service appropriate? Yes; Fluid consistency: Thin  Diet effective now                   EDUCATION NEEDS:   No education needs have been identified at this time  Skin:  Skin Assessment: Skin Integrity Issues: Skin Integrity Issues:: DTI, Other (Comment) DTI: lt sacrum per CWOCN assessment Other: IAD bilateral buttocks  Last BM:  12/19/21  Height:   Ht Readings from Last 1 Encounters:  12/16/21 4\' 3"  (1.295 m)    Weight:   Wt Readings from Last 1 Encounters:  12/21/21 94.5 kg    Ideal Body Weight:  41.2 kg (adjusted for rt BKA and lt AKA)  BMI:  Body mass index is 56.32 kg/m.  Estimated Nutritional Needs:   Kcal:  1650-1850  Protein:  85-100 grams  Fluid:  1000 ml + UOP    Loistine Chance, RD, LDN, [place] Registered Dietitian II Certified Diabetes Care and Education Specialist Please refer to [facility] for RD and/or RD on-call/weekend/after hours pager

## 2021-12-21 NOTE — Progress Notes (Signed)
   Date of Admission:  12/16/2021     ID: Antonio Phillips is a 76 y.o. male Principal Problem:   Acute metabolic encephalopathy Active Problems:   ESRD on hemodialysis ([place])   Pressure injury of skin   Thrombocytopenia (HCC)   PVD (peripheral vascular disease) (HCC)   Permanent atrial fibrillation (HCC)   Hypotension   Morbid obesity (HCC)   Sinus bradycardia   Acute on chronic respiratory failure with hypoxia and hypercapnia (HCC)   Acute on chronic combined systolic and diastolic CHF (congestive heart failure) (HCC)   Elevated troponin   Acute respiratory failure with hypercapnia (HCC)   CO2 narcosis    Subjective: Pt is more awake Responding to questions Says he has some pain in his back  Medications:   artificial tears   Both Eyes BID   vitamin C  500 mg Oral BID   atorvastatin  40 mg Oral QHS   calcitRIOL  0.25 mcg Oral Daily   calcium acetate  1,334 mg Oral TID   Chlorhexidine Gluconate Cloth  6 each Topical Q0600   cyanocobalamin  1,000 mcg Oral Daily   folic acid  1 mg Oral Daily   Gerhardt's butt cream   Topical Q4H   midodrine  20 mg Oral TID WC   multivitamin  1 tablet Oral QHS   pregabalin  75 mg Oral QHS   rOPINIRole  1 mg Oral QHS   sertraline  50 mg Oral Daily   sevelamer carbonate  1,600 mg Oral 3 times per day on Sun Mon Wed Fri    Objective: Vital signs in last 24 hours: Temp:  [97.5 F (36.4 C)-98.1 F (36.7 C)] 97.8 F (36.6 C) (10/17 1406) Pulse Rate:  [58-94] 75 (10/17 1406) Resp:  [10-20] 16 (10/17 1406) BP: (88-118)/(48-71) 102/70 (10/17 1406) SpO2:  [96 %-100 %] 100 % (10/17 1406) Weight:  [92.2 kg-94.5 kg] 92.2 kg (10/17 1329)    PHYSICAL EXAM:  General: awake,more  alert Lungs: b/l air entry- crepts Heart: Regular rate and rhythm, no murmur, rub or gallop. Abdomen: Soft, edema wall of abdomen Extremities: left aka- has scab Rt AKA Knee abrasion Skin: No rashes or lesions. Or bruising Lymph: Cervical, supraclavicular  normal. Neurologic: Grossly non-focal  Lab Results Recent Labs    12/20/21 0746 12/21/21 0630  WBC 6.1 4.5  HGB 10.9* 11.3*  HCT 37.4* 38.8*  NA 138  --   K 3.9  --   CL 102  --   CO2 28  --   BUN 29*  --   CREATININE 3.68*  --     Microbiology: 10/12 bc MRSE 10/15 BC Ng    Assessment/Plan: Hypercapnia  /Co2 narcosis causing  respiratory acidosis and metabolic encephalopathy- improved   OSA   CAD/   CHF-    Afib Pulmonary HTN   Anasarca   ESRD Macrocytic anemia Thrombocytopenia- unclear cause  could he have cirrhosis?   Staph epidermidis bacteremia- taken for the same site - very likely contaminant, he does have some skin tears but they do not look grossly infected Repeat blood culture  negative t   Vanco until 12/23/21 1 gram during HD      Left AKA Rt BKA PAD  Morbid obesity  Discussed the management with hospitalist ID will sign off- call if needed

## 2021-12-21 NOTE — Progress Notes (Signed)
Dialysis tx completed.  Tolerated well  no c/o or distress noted. Stable conditon.  Ran for 3.5hrs. took off 2.0L fluid.  84L processed\.  B/p 101/56  report given to floor RN

## 2021-12-21 NOTE — Progress Notes (Signed)
[facility]  ROUNDING NOTE   Subjective:   Antonio Phillips is a 76 year old male with past medical conditions including diabetes, hyperlipidemia, CAD, hypertension, atrial fibrillation on Eliquis, and end-stage renal disease on hemodialysis.  Patient presents to the emergency department from his dialysis clinic with complaints of chest pain/pressure, hypotension, and decreased mentation.  Patient has been admitted for Acute respiratory failure with hypercapnia ([place]) [J96.02] Acute encephalopathy [G93.40] CO2 narcosis [R06.89] ESRD on hemodialysis ([place]) [N18.6, R60.4] Acute metabolic encephalopathy [V40.98]  Patient is known to our practice and receives outpatient dialysis treatments at [facility] on a TTS schedule, supervised by Dr. Candiss Norse.   Patient was seen today on dialysis unit Patient resting Patient tolerating treatment well   Objective:  Vital signs in last 24 hours:  Temp:  [97.5 F (36.4 C)-98.1 F (36.7 C)] 97.8 F (36.6 C) (10/17 1406) Pulse Rate:  [58-94] 75 (10/17 1406) Resp:  [10-20] 16 (10/17 1406) BP: (88-118)/(48-71) 102/70 (10/17 1406) SpO2:  [96 %-100 %] 100 % (10/17 1406) Weight:  [92.2 kg-94.5 kg] 92.2 kg (10/17 1329)  Weight change:  Filed Weights   12/18/21 0328 12/21/21 0943 12/21/21 1329  Weight: 95.8 kg 94.5 kg 92.2 kg    Intake/Output: No intake/output data recorded.   Intake/Output this shift:  Total I/O In: -  Out: 2000 [Other:2000]  Physical Exam: General:  NAD  Head: Normocephalic, atraumatic.   Eyes: Anicteric  Lungs:  Crackles Present, normal effort, [place] O2, Decreased breath sounds at bases  Heart: Irregular rate and rhythm  Abdomen:  Soft, nontender, obese  Extremities: 1+ peripheral edema.  Neurologic: Nonfocal, moving all four extremities  Skin: No lesions  Access: Left upper AVF    Basic Metabolic Panel: Recent Labs  Lab 12/16/21 1224 12/17/21 0501 12/18/21 0721 12/20/21 0746  NA 141 141  138 138  K 3.7 3.6 4.0 3.9  CL 103 101 101 102  CO2 28 28 27 28   GLUCOSE 101* 72 78 94  BUN 31* 19 28* 29*  CREATININE 3.89* 2.80* 3.59* 3.68*  CALCIUM 9.5 9.0 9.0 9.2    Liver Function Tests: Recent Labs  Lab 12/16/21 1224  AST 16  ALT 7  ALKPHOS 70  BILITOT 1.3*  PROT 6.1*  ALBUMIN 3.4*   No results for input(s): "LIPASE", "AMYLASE" in the last 168 hours. Recent Labs  Lab 12/16/21 1224  AMMONIA 36*    CBC: Recent Labs  Lab 12/16/21 1224 12/17/21 0501 12/18/21 0721 12/20/21 0746 12/21/21 0630  WBC 6.7 4.7 4.2 6.1 4.5  NEUTROABS 3.9  --   --   --   --   HGB 12.0* 10.4* 10.3* 10.9* 11.3*  HCT 42.1 36.3* 35.6* 37.4* 38.8*  MCV 109.9* 111.0* 110.6* 109.0* 107.8*  PLT 80* 63* 20* 53* 44*    Cardiac Enzymes: No results for input(s): "CKTOTAL", "CKMB", "CKMBINDEX", "TROPONINI" in the last 168 hours.  BNP: Invalid input(s): "POCBNP"  CBG: Recent Labs  Lab 12/17/21 0113 12/18/21 0250 12/20/21 2041  GLUCAP 80 33 108*    Microbiology: Results for orders placed or performed during the hospital encounter of 12/16/21  Blood Culture (routine x 2)     Status: Abnormal   Collection Time: 12/16/21 12:24 PM   Specimen: BLOOD  Result Value Ref Range Status   Specimen Description   Final    BLOOD RIGHT [facility] Performed at [facility], [address], [place], [place] [address]    Special Requests   Final  BOTTLES DRAWN AEROBIC AND ANAEROBIC Blood Culture results may not be optimal due to an inadequate volume of blood received in culture bottles Performed at [facility], [address] [place], [place] [address]    Culture  Setup Time   Final    GRAM POSITIVE COCCI IN BOTH AEROBIC AND ANAEROBIC BOTTLES CRITICAL VALUE NOTED.  VALUE IS CONSISTENT WITH PREVIOUSLY REPORTED AND CALLED VALUE. Performed at [facility], [address] [place], [place] [address]    Culture (A)  Final    STAPHYLOCOCCUS EPIDERMIDIS SUSCEPTIBILITIES  PERFORMED ON PREVIOUS CULTURE WITHIN THE LAST 5 DAYS. Performed at [facility], [place] [address], [place], [place] [address]    Report Status 12/19/2021 FINAL  Final  Blood Culture (routine x 2)     Status: Abnormal   Collection Time: 12/16/21 12:24 PM   Specimen: BLOOD  Result Value Ref Range Status   Specimen Description   Final    BLOOD BLOOD RIGHT FOREARM Performed at [facility], [place] [address], [place], [place] [address]    Special Requests   Final    BOTTLES DRAWN AEROBIC AND ANAEROBIC Blood Culture results may not be optimal due to an inadequate volume of blood received in culture bottles Performed at [facility], [address] [place], [place] [address]    Culture  Setup Time   Final    GRAM POSITIVE COCCI IN BOTH AEROBIC AND ANAEROBIC BOTTLES Organism ID to follow CRITICAL RESULT CALLED TO, READ BACK BY AND VERIFIED WITH: JASON ROBBINS@0626  12/17/21 RH Performed at [facility], [address] [place], [place] [address]    Culture STAPHYLOCOCCUS EPIDERMIDIS (A)  Final   Report Status 12/19/2021 FINAL  Final   Organism ID, Bacteria STAPHYLOCOCCUS EPIDERMIDIS  Final      Susceptibility   Staphylococcus epidermidis - MIC*    CIPROFLOXACIN <=0.5 SENSITIVE Sensitive     ERYTHROMYCIN >=8 RESISTANT Resistant     GENTAMICIN <=0.5 SENSITIVE Sensitive     OXACILLIN >=4 RESISTANT Resistant     TETRACYCLINE <=1 SENSITIVE Sensitive     VANCOMYCIN 1 SENSITIVE Sensitive     TRIMETH/SULFA <=10 SENSITIVE Sensitive     CLINDAMYCIN >=8 RESISTANT Resistant     RIFAMPIN <=0.5 SENSITIVE Sensitive     Inducible Clindamycin NEGATIVE Sensitive     * STAPHYLOCOCCUS EPIDERMIDIS  Blood Culture ID Panel (Reflexed)     Status: Abnormal   Collection Time: 12/16/21 12:24 PM  Result Value Ref Range Status   Enterococcus faecalis NOT DETECTED NOT DETECTED Final   Enterococcus Faecium NOT DETECTED NOT DETECTED Final   Listeria monocytogenes NOT DETECTED NOT  DETECTED Final   Staphylococcus species DETECTED (A) NOT DETECTED Final    Comment: CRITICAL RESULT CALLED TO, READ BACK BY AND VERIFIED WITH: JASON ROBBINS@0626  12/17/21 RH    Staphylococcus aureus (BCID) NOT DETECTED NOT DETECTED Final   Staphylococcus epidermidis DETECTED (A) NOT DETECTED Final    Comment: Methicillin (oxacillin) resistant coagulase negative staphylococcus. Possible blood culture contaminant (unless isolated from more than one blood culture draw or clinical case suggests pathogenicity). No antibiotic treatment is indicated for blood  culture contaminants. CRITICAL RESULT CALLED TO, READ BACK BY AND VERIFIED WITH: JASON ROBBINS@0626  12/17/21 RH    Staphylococcus lugdunensis NOT DETECTED NOT DETECTED Final   Streptococcus species NOT DETECTED NOT DETECTED Final   Streptococcus agalactiae NOT DETECTED NOT DETECTED Final   Streptococcus pneumoniae NOT DETECTED NOT DETECTED Final   Streptococcus pyogenes NOT DETECTED  NOT DETECTED Final   A.calcoaceticus-baumannii NOT DETECTED NOT DETECTED Final   Bacteroides fragilis NOT DETECTED NOT DETECTED Final   Enterobacterales NOT DETECTED NOT DETECTED Final   Enterobacter cloacae complex NOT DETECTED NOT DETECTED Final   Escherichia coli NOT DETECTED NOT DETECTED Final   Klebsiella aerogenes NOT DETECTED NOT DETECTED Final   Klebsiella oxytoca NOT DETECTED NOT DETECTED Final   Klebsiella pneumoniae NOT DETECTED NOT DETECTED Final   Proteus species NOT DETECTED NOT DETECTED Final   Salmonella species NOT DETECTED NOT DETECTED Final   Serratia marcescens NOT DETECTED NOT DETECTED Final   Haemophilus influenzae NOT DETECTED NOT DETECTED Final   Neisseria meningitidis NOT DETECTED NOT DETECTED Final   Pseudomonas aeruginosa NOT DETECTED NOT DETECTED Final   Stenotrophomonas maltophilia NOT DETECTED NOT DETECTED Final   Candida albicans NOT DETECTED NOT DETECTED Final   Candida auris NOT DETECTED NOT DETECTED Final   Candida  glabrata NOT DETECTED NOT DETECTED Final   Candida krusei NOT DETECTED NOT DETECTED Final   Candida parapsilosis NOT DETECTED NOT DETECTED Final   Candida tropicalis NOT DETECTED NOT DETECTED Final   Cryptococcus neoformans/gattii NOT DETECTED NOT DETECTED Final   Methicillin resistance mecA/C DETECTED (A) NOT DETECTED Final    Comment: CRITICAL RESULT CALLED TO, READ BACK BY AND VERIFIED WITH: JASON ROBBINS@0626  12/17/21 RH Performed at [facility], [address] [place], [place] [address]   Resp Panel by RT-PCR (Flu A&B, Covid) Anterior Nasal Swab     Status: None   Collection Time: 12/16/21  1:10 PM   Specimen: Anterior Nasal Swab  Result Value Ref Range Status   SARS Coronavirus 2 by RT PCR NEGATIVE NEGATIVE Final    Comment: (NOTE) SARS-CoV-2 target nucleic acids are NOT DETECTED.  The SARS-CoV-2 RNA is generally detectable in upper respiratory specimens during the acute phase of infection. The lowest concentration of SARS-CoV-2 viral copies this assay can detect is 138 copies/mL. A negative result does not preclude SARS-Cov-2 infection and should not be used as the sole basis for treatment or other patient management decisions. A negative result may occur with  improper specimen collection/handling, submission of specimen other than nasopharyngeal swab, presence of viral mutation(s) within the areas targeted by this assay, and inadequate number of viral copies(<138 copies/mL). A negative result must be combined with clinical observations, patient history, and epidemiological information. The expected result is Negative.  Fact Sheet for Patients:  EntrepreneurPulse.com.au  Fact Sheet for Healthcare Providers:  IncredibleEmployment.be  This test is no t yet approved or cleared by the [place] FDA and  has been authorized for detection and/or diagnosis of SARS-CoV-2 by FDA under an Emergency Use Authorization (EUA). This  EUA will remain  in effect (meaning this test can be used) for the duration of the COVID-19 declaration under Section 564(b)(1) of the Act, 21 U.S.C.section 360bbb-3(b)(1), unless the authorization is terminated  or revoked sooner.       Influenza A by PCR NEGATIVE NEGATIVE Final   Influenza B by PCR NEGATIVE NEGATIVE Final    Comment: (NOTE) The Xpert Xpress SARS-CoV-2/FLU/RSV plus assay is intended as an aid in the diagnosis of influenza from Nasopharyngeal swab specimens and should not be used as a sole basis for treatment. Nasal washings and aspirates are unacceptable for Xpert Xpress SARS-CoV-2/FLU/RSV testing.  Fact Sheet for Patients: EntrepreneurPulse.com.au  Fact Sheet for Healthcare Providers: IncredibleEmployment.be  This test is not yet approved or cleared by the [place] and has been authorized for  detection and/or diagnosis of SARS-CoV-2 by FDA under an Emergency Use Authorization (EUA). This EUA will remain in effect (meaning this test can be used) for the duration of the COVID-19 declaration under Section 564(b)(1) of the Act, 21 U.S.C. section 360bbb-3(b)(1), unless the authorization is terminated or revoked.  Performed at [facility], [address] [place], [place] [address]   Culture, blood (Routine X 2) w Reflex to ID Panel     Status: None (Preliminary result)   Collection Time: 12/19/21  4:12 PM   Specimen: BLOOD RIGHT HAND  Result Value Ref Range Status   Specimen Description BLOOD RIGHT HAND  Final   Special Requests   Final    BOTTLES DRAWN AEROBIC AND ANAEROBIC Blood Culture adequate volume   Culture   Final    NO GROWTH 2 DAYS Performed at [facility], [address], [place], [place] [address]    Report Status PENDING  Incomplete  Culture, blood (Routine X 2) w Reflex to ID Panel     Status: None (Preliminary result)   Collection Time: 12/19/21  4:18 PM   Specimen: BLOOD  RIGHT HAND  Result Value Ref Range Status   Specimen Description BLOOD RIGHT HAND  Final   Special Requests   Final    BOTTLES DRAWN AEROBIC AND ANAEROBIC Blood Culture adequate volume   Culture   Final    NO GROWTH 2 DAYS Performed at [facility], [address] [place], [place] [address]    Report Status PENDING  Incomplete    Coagulation Studies: No results for input(s): "LABPROT", "INR" in the last 72 hours.   Urinalysis: No results for input(s): "COLORURINE", "LABSPEC", "PHURINE", "GLUCOSEU", "HGBUR", "BILIRUBINUR", "KETONESUR", "PROTEINUR", "UROBILINOGEN", "NITRITE", "LEUKOCYTESUR" in the last 72 hours.  Invalid input(s): "APPERANCEUR"    Imaging: No results found.   Medications:    anticoagulant sodium citrate     [START ON 12/23/2021] vancomycin      artificial tears   Both Eyes BID   vitamin C  500 mg Oral BID   atorvastatin  40 mg Oral QHS   calcitRIOL  0.25 mcg Oral Daily   calcium acetate  1,334 mg Oral TID   Chlorhexidine Gluconate Cloth  6 each Topical Q0600   cyanocobalamin  1,000 mcg Oral Daily   folic acid  1 mg Oral Daily   Gerhardt's butt cream   Topical Q4H   midodrine  20 mg Oral TID WC   multivitamin  1 tablet Oral QHS   pregabalin  75 mg Oral QHS   rOPINIRole  1 mg Oral QHS   sertraline  50 mg Oral Daily   sevelamer carbonate  1,600 mg Oral 3 times per day on Sun Mon Wed Fri   acetaminophen, alteplase, anticoagulant sodium citrate, heparin, lactulose, lidocaine (PF), lidocaine-prilocaine, ondansetron **OR** ondansetron (ZOFRAN) IV, pentafluoroprop-tetrafluoroeth, polyvinyl alcohol  Assessment/ Plan:  Antonio Phillips is a 76 y.o.  male with past medical conditions including diabetes, hyperlipidemia, CAD, hypertension, atrial fibrillation on Eliquis, and end-stage renal disease on hemodialysis.  Patient presents to the emergency department from his dialysis clinic with complaints of chest pain/pressure, hypotension, and  decreased mentation.  Patient has been admitted for Acute respiratory failure with hypercapnia ([place]) [J96.02] Acute encephalopathy [G93.40] CO2 narcosis [R06.89] ESRD on hemodialysis ([place]) [N18.6, R42.7] Acute metabolic encephalopathy [C62.37]  [facility]/TTS/left AVF  Fluid volume overload with end-stage renal disease on hemodialysis.  Chest x-ray shows pulmonary vascular congestion.  Patient received  urgent dialysis At the time of admission.   Patient will be dialyzed today   2. Anemia of chronic kidney disease Lab Results  Component Value Date   HGB 11.3 (L) 12/21/2021    Hemoglobin at goal.  Patient receives [place] outpatient.  3. Secondary Hyperparathyroidism: with outpatient labs: PTH 172, phosphorus 5.3, calcium 8.3.  Lab Results  Component Value Date   CALCIUM 9.2 12/20/2021   PHOS 4.4 12/05/2021    We will continue to monitor bone minerals during this admission.  Continue sevelamer and calcium acetate with meals and daily calcitriol.  4. 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  Patient is being followed with the primary team  5.Thrombocytopenia Patient platelet count  low  Primary team following Will not use any heparin during dialysis  6.Pleural effusion Patient may need tap Primary team is following    LOS: 5 Ivery Nanney s Donnavin Vandenbrink 10/17/20235:43 PM

## 2021-12-22 DIAGNOSIS — G9341 Metabolic encephalopathy: Secondary | ICD-10-CM | POA: Diagnosis not present

## 2021-12-22 LAB — BLOOD GAS, ARTERIAL
Acid-base deficit: 0.3 mmol/L (ref 0.0–2.0)
Bicarbonate: 29.6 mmol/L — ABNORMAL HIGH (ref 20.0–28.0)
Delivery systems: POSITIVE
Expiratory PAP: 5 cmH2O
FIO2: 35 %
Inspiratory PAP: 12 cmH2O
Mechanical Rate: 20
O2 Saturation: 80.2 %
Patient temperature: 37
pCO2 arterial: 74 mmHg (ref 32–48)
pH, Arterial: 7.21 — ABNORMAL LOW (ref 7.35–7.45)
pO2, Arterial: 50 mmHg — ABNORMAL LOW (ref 83–108)

## 2021-12-22 LAB — BLOOD GAS, VENOUS
Acid-base deficit: 0.5 mmol/L (ref 0.0–2.0)
Bicarbonate: 30.7 mmol/L — ABNORMAL HIGH (ref 20.0–28.0)
O2 Saturation: 77.5 %
Patient temperature: 37
pCO2, Ven: 86 mmHg (ref 44–60)
pH, Ven: 7.16 — CL (ref 7.25–7.43)
pO2, Ven: 50 mmHg — ABNORMAL HIGH (ref 32–45)

## 2021-12-22 MED ORDER — HYDROCODONE-ACETAMINOPHEN 5-325 MG PO TABS: 1.0000 | ORAL_TABLET | Freq: Four times a day (QID) | ORAL | 0 refills | Status: AC | PRN

## 2021-12-22 MED ORDER — MIDODRINE HCL 10 MG PO TABS: 20.0000 mg | ORAL_TABLET | Freq: Three times a day (TID) | ORAL | 0 refills | Status: AC

## 2021-12-22 MED ORDER — VANCOMYCIN HCL IN DEXTROSE 1-5 GM/200ML-% IV SOLN: 1000.0000 mg | INTRAVENOUS | 0 refills | Status: AC

## 2021-12-22 MED ORDER — RENA-VITE PO TABS: 1.0000 | ORAL_TABLET | Freq: Every day | ORAL | 0 refills | Status: AC

## 2021-12-22 NOTE — Progress Notes (Signed)
EMS in the unit, patient discharged to [facility]

## 2021-12-22 NOTE — Progress Notes (Signed)
Report called to [facility].  Report given to Unit Manager.  Patient waiting for transport.

## 2021-12-22 NOTE — TOC Transition Note (Signed)
Transition of Care [facility]) - CM/SW Discharge Note   Patient Details  Name: Antonio Phillips MRN: [id number] Date of Birth: Nov 30, 1945  Transition of Care [facility]) CM/SW Contact:  Candie Chroman, LCSW Phone Number: 12/22/2021, 12:56 PM   Clinical Narrative:   Patient has orders to discharge back to [facility] today. RN has already called report. EMS transport has been arranged and he is 4th on the list. No further concerns. CSW signing off.  Final next level of care: Skilled Nursing Facility Barriers to Discharge: Barriers Resolved   Patient Goals and CMS Choice   CMS Medicare.gov Compare Post Acute Care list provided to::  (N/A) Choice offered to / list presented to : NA  Discharge Placement   Existing PASRR number confirmed : 12/20/21          Patient chooses bed at: [facility] Patient to be transferred to facility by: EMS Name of family member notified: Patient declined Patient and family notified of of transfer: 12/22/21  Discharge Plan and Services     Post Acute Care Choice: Resumption of Svcs/PTA Provider                               Social Determinants of Health (SDOH) Interventions     Readmission Risk Interventions    12/20/2021    3:56 PM 09/10/2019    1:35 PM  Readmission Risk Prevention Plan  Transportation Screening Complete Complete  PCP or Specialist Appt within 3-5 Days Complete Patient refused  [place] or [place]  Patient refused  Social Work Consult for [place] Planning/Counseling Complete Complete  Palliative Care Screening Complete Not Applicable  Medication Review Press photographer) Complete Complete

## 2021-12-22 NOTE — Progress Notes (Signed)
Patient asked this RN about his cell phone.  Looked up admission documentation - pt admitted with patient clothing, hat, and a lift blanket which are all accounted for and placed in a white bag for transport.  No documented cell phone on admission.  This RN called the nursing facility where the pt came from and being discharged to today.  Per staff, pt's cell phone is on his bedside table.  Patient made aware.

## 2021-12-22 NOTE — TOC Progression Note (Addendum)
Transition of Care [facility]) - Progression Note    Patient Details  Name: Antonio Phillips MRN: [id number] Date of Birth: August 07, 1945  Transition [facility] Care [facility]) CM/SW [place], LCSW Phone Number: 12/22/2021, 10:26 AM  Clinical Narrative:   SNF is ordering bipap machine and will notify TOC once delivery date determined. IV abx until tomorrow. Nephrology NP will notify his HD clinic.  11:15 am: Bipap will be delivered today. MD is aware.  Expected Discharge Plan: Skilled Nursing Facility Barriers to Discharge: Continued Medical Work up  Expected Discharge Plan and Services Expected Discharge Plan: [place] Acute Care Choice: Resumption of Svcs/PTA Provider Living arrangements for the past 2 months: [place]                                       Social Determinants of Health (SDOH) Interventions    Readmission Risk Interventions    12/20/2021    3:56 PM 09/10/2019    1:35 PM  Readmission Risk Prevention Plan  Transportation Screening Complete Complete  PCP or Specialist Appt within 3-5 Days Complete Patient refused  [place] or Home Care Consult  Patient refused  Social Work Consult for [place] Planning/Counseling Complete Complete  Palliative Care Screening Complete Not Applicable  Medication Review Press photographer) Complete Complete

## 2021-12-22 NOTE — Discharge Summary (Signed)
Physician Discharge Summary   Patient: Antonio Phillips MRN: [id number]  DOB: 23-Dec-1945   Admit:     Date of Admission: 12/16/2021 Admitted from: ALF   Discharge: Date of discharge: 12/22/21 Disposition: Skilled nursing facility Condition at discharge: fair  CODE STATUS: DNR     Discharge Physician: Emeterio Reeve, DO [facility]     PCP: Marsh Dolly, MD  Recommendations for Outpatient Follow-up:  Follow up with PCP Marsh Dolly, MD in 1-2 weeks Please obtain labs/tests: CBC in 1-2 weeks Please follow up on the following pending results: none BiPap qhs Vancomycin at dialysis as below  PCP AND OTHER OUTPATIENT PROVIDERS: SEE BELOW FOR SPECIFIC DISCHARGE INSTRUCTIONS PRINTED FOR PATIENT IN ADDITION TO GENERIC AVS PATIENT INFO    Discharge Instructions     Diet - low sodium heart healthy   Complete by: As directed    Discharge instructions   Complete by: As directed    BiPap qhs Vancomycin at dialysis  Wound care as per AVS   Discharge wound care:   Complete by: As directed    Wound care to bilateral buttocks, ischial tuberosities, inguinal areas, medial and posterior thighs, perianal areas:  Cleanse with house skin cleanser, pat gently dry. Apply Gerhart's Butt cream in a thin layer. Apply every 4 hours while awake and PRN after soiling   Increase activity slowly   Complete by: As directed          Discharge Diagnoses: Principal Problem:   Acute metabolic encephalopathy Active Problems:   Hypotension   Sinus bradycardia   Acute on chronic respiratory failure with hypoxia and hypercapnia (HCC)   Acute on chronic combined systolic and diastolic CHF (congestive heart failure) (HCC)   ESRD on hemodialysis (HCC)   Pressure injury of skin   Thrombocytopenia (HCC)   PVD (peripheral vascular disease) (HCC)   Permanent atrial fibrillation (HCC)   Morbid obesity (HCC)   Elevated troponin   Acute respiratory failure with hypercapnia (HCC)    CO2 narcosis   Coag negative Staphylococcus bacteremia       Hospital Course: 76 year old male with medical history for ESRD on hemodialysis Tuesday Thursday Saturday, history of chronic hypoxic respiratory failure on 2 L of oxygen via nasal cannula, CAD s/p PCI with stent angioplasty 7/22, history of permanent A-fib not on anticoagulation, 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 He resides at [facility]  10/12: Patient was found to be in uncompensated respiratory stenosis with hypercapnia and was placed on BiPAP. Fluid overloaded d/t acute on chronic CHF. Underwent HD.  10/13: Seen by cardiology - advised continue HD. Repeat Echo. Refusing BiPap. Remains on [place] O2 at 3L. Difficult HD today d/t hypotension. Needing albumin for BP support. Palliative care consulted. No CPR/intubation. BCx from 10/12 (+)staph epidermidis methicillin resistance. Started on vancomycin 10/13 10/14: cardio s/o.  10/15: repeat BCx ordered. S/S back on staph bacteremia. Continue vanc for now. Consult ID 10/16: lethargic this AM requiring to be placed back on BiPap, improved on this. ID to see today, continue vancomycin pending any further ID recs. 10/17: BCx resulted this afternoon negative, can continue vancomycin outpatient pending confirmation w/ ID, TOC to confirm abx and BiPap equipment   10/18: BiPap and abx plan arranged, patient stable for discharge      Consultants:  Nephrology  Cardiology  Palliative care  Infectious disease   Procedures: Hemodialysis   Antibiotics: Vancomycin 12/17/2021 - present, finish outpatient per med rec      ASSESSMENT & PLAN:   Principal Problem:   Acute metabolic encephalopathy Active Problems:   Hypotension   Sinus bradycardia   Acute on chronic respiratory failure with hypoxia and hypercapnia (HCC)    Acute on chronic combined systolic and diastolic CHF (congestive heart failure) (HCC)   ESRD on hemodialysis (HCC)   Pressure injury of skin   Thrombocytopenia (HCC)   PVD (peripheral vascular disease) (HCC)   Permanent atrial fibrillation (HCC)   Morbid obesity (HCC)   Elevated troponin   Acute respiratory failure with hypercapnia (HCC)   CO2 narcosis   Acute metabolic encephalopathy - resolved Secondary to uncompensated respiratory acidosis with hypercapnia Mental status had improved but waxes/wanes, improved w/ BiPap   Acute on chronic respiratory failure with hypoxia and hypercapnia Uncompensated respiratory acidosis on ABG Likely d/t fluid overload acute on chronic HFrEF  Weaned to baseline O2 2-3L/min Has been needing BiPap qhs - will need this outpatient    Bacteremia vs BCx contaminant - 2 cultures (+)Staph Epidermidis Started 10/13 on vancomycin  final culture result --> (S) Vancomycin  ID consult --> Likely contaminant. Repeat blood culture sent 10/15 --> negative -->  will DC vanco after completion of 7 days    Hypotension - stable, no symptoms  Patient has been on midodrine 10 mg 3 times daily --> Dose of midodrine changed to 20 mg p.o. 3 times daily Held home po antihypertensives  Check vitals daily    Acute on chronic systolic CHF / HFrEF - exacerbation resovled Chest x-ray showed bilateral pleural effusions with pulmonary vascular congestion LVEF 40 to 45% as per echo from December 2020  Continue renal replacement therapy Net IO Since Admission: -3,179 mL [12/21/21 1740]   ESRD - stable ESRD and is on hemodialysis Tuesday Thursday and Saturday history of noncompliance with dialysis Nephrology following    Pressure injury of the skin present on admission - stable Patient has unstageable pressure ulcers over the gluteal region Wound care    Thrombocytopenia - stable Continue NO anticoagulation for Afib, no Plavix for PAD Repeat CBC periodically  Close  monitor for bleeding, low threshold for transfusion   Mild elevation of troponin Likely due to demand ischemia in the setting of acute on chronic systolic CHF Echocardiogram --> LVEF 55, no RWMA, mild LVH, indeterminate diastolic fxn    Permanent atrial fibrillation Metoprolol on hold due to hypotension Not on long-term anticoagulation, continue to hold due to thrombocytopenia with increased risk of bleeding Heart rate had fairly well controlled, less than 100, occasionally higher Remains on telemetry   PVD S/p right BKA and left AKA Continue atorvastatin Plavix on hold due to thrombocytopenia              Discharge Instructions  Allergies as of 12/22/2021       Reactions   Simvastatin Other (See Comments)        Medication List     STOP taking these medications    cephALEXin 500 MG capsule Commonly known as: KEFLEX   clopidogrel 75 MG tablet Commonly known as: PLAVIX   Constulose 10 GM/15ML solution Generic drug: lactulose   fluconazole 200 MG tablet Commonly known as: DIFLUCAN   Hibiclens 4 % external liquid Generic drug: chlorhexidine   metoprolol succinate 50 MG 24 hr tablet Commonly known as: TOPROL-XL   metoprolol tartrate 25 MG tablet  Commonly known as: LOPRESSOR   mupirocin ointment 2 % Commonly known as: BACTROBAN   terbinafine 250 MG tablet Commonly known as: LAMISIL       TAKE these medications    acetaminophen 325 MG tablet Commonly known as: TYLENOL Take 2 tablets (650 mg total) by mouth every 6 (six) hours as needed for mild pain or moderate pain.   atorvastatin 40 MG tablet Commonly known as: LIPITOR Take 40 mg by mouth at bedtime.   calcitRIOL 0.25 MCG capsule Commonly known as: ROCALTROL Take 1 tablet by mouth daily.   calcium acetate 667 MG capsule Commonly known as: PHOSLO Take 1,334 mg by mouth 3 (three) times daily.   cyanocobalamin 1000 MCG tablet Commonly known as: VITAMIN B12 Take 1,000 mcg by mouth  daily.   diclofenac Sodium 1 % Gel Commonly known as: VOLTAREN Apply 2 g topically in the morning. (Apply to right stump)   folic acid 1 MG tablet Commonly known as: FOLVITE Take 1 mg by mouth daily.   HYDROcodone-acetaminophen 5-325 MG tablet Commonly known as: NORCO/VICODIN Take 1 tablet by mouth every 6 (six) hours as needed for moderate pain.   lactulose (encephalopathy) 10 GM/15ML Soln Commonly known as: CHRONULAC Take 10 g by mouth 2 (two) times daily as needed (mild constipation).   midodrine 10 MG tablet Commonly known as: PROAMATINE Take 2 tablets (20 mg total) by mouth 3 (three) times daily with meals. What changed: how much to take   multivitamin Tabs tablet Take 1 tablet by mouth at bedtime.   nystatin powder Commonly known as: MYCOSTATIN/NYSTOP Apply topically.   pregabalin 75 MG capsule Commonly known as: LYRICA Take 75 mg by mouth daily. What changed: Another medication with the same name was removed. Continue taking this medication, and follow the directions you see here.   Refresh Liquigel 1 % Gel Generic drug: Carboxymethylcellulose Sodium Place 1 drop into both eyes in the morning and at bedtime. What changed: Another medication with the same name was removed. Continue taking this medication, and follow the directions you see here.   rOPINIRole 1 MG tablet Commonly known as: REQUIP Take 1 mg by mouth at bedtime.   sertraline 50 MG tablet Commonly known as: ZOLOFT Take 50 mg by mouth daily.   sevelamer carbonate 800 MG tablet Commonly known as: RENVELA Take 1,600 mg by mouth See admin instructions. Take 2 tablets (1600mg ) by mouth three times daily with meals on Monday, Wednesday, Friday and Sunday   vancomycin 1-5 GM/200ML-% Soln Commonly known as: VANCOCIN Inject 200 mLs (1,000 mg total) into the vein Every Tuesday,Thursday,and Saturday with dialysis. Start taking on: December 23, 2021               Discharge Care Instructions   (From admission, onward)           Start     Ordered   12/22/21 0000  Discharge wound care:       Comments: Wound care to bilateral buttocks, ischial tuberosities, inguinal areas, medial and posterior thighs, perianal areas:  Cleanse with house skin cleanser, pat gently dry. Apply Gerhart's Butt cream in a thin layer. Apply every 4 hours while awake and PRN after soiling   12/22/21 1126             Contact information for after-discharge care     Destination     [facility] .   Service: Skilled Nursing Contact information: [address] [place]  [phone]                     Allergies  Allergen Reactions   Simvastatin Other (See Comments)     Subjective: patient has no complaints today, pain is controlled, no CP/SOB   Discharge Exam: BP (!) 103/53 (BP Location: Right Arm)   Pulse 75   Temp (!) 97.5 F (36.4 C)   Resp 16   Ht 4\' 3"  (1.295 m)   Wt 92.2 kg   SpO2 100%   BMI 54.94 kg/m  Constitutional:  VS as above General Appearance: alert, well-developed, obese, NAD Respiratory: Normal respiratory effort No wheeze Cardiovascular: S1/S2 normal + murmur Irreg/irreg Normal rate  Gastrointestinal: No tenderness Musculoskeletal:  Symmetrical movement in all extremities R BKA, L AKA Neurological: No cranial nerve deficit on limited exam Alert Very hard of hearing  Psychiatric: Fair judgment/insight Flat mood and affect     The results of significant diagnostics from this hospitalization (including imaging, microbiology, ancillary and laboratory) are listed below for reference.     Microbiology: Recent Results (from the past 240 hour(s))  Blood Culture (routine x 2)     Status: Abnormal   Collection Time: 12/16/21 12:24 PM   Specimen: BLOOD  Result Value Ref Range Status   Specimen Description   Final    BLOOD RIGHT [facility] Performed at [facility], [address] [place], [place] [address]    Special Requests   Final    BOTTLES DRAWN AEROBIC AND ANAEROBIC Blood Culture results may not be optimal due to an inadequate volume of blood received in culture bottles Performed at [facility], [address], [place], [place] [address]    Culture  Setup Time   Final    GRAM POSITIVE COCCI IN BOTH AEROBIC AND ANAEROBIC BOTTLES CRITICAL VALUE NOTED.  VALUE IS CONSISTENT WITH PREVIOUSLY REPORTED AND CALLED VALUE. Performed at [facility], [address] [place], [place] [address]    Culture (A)  Final    STAPHYLOCOCCUS EPIDERMIDIS SUSCEPTIBILITIES PERFORMED ON PREVIOUS CULTURE WITHIN THE LAST 5 DAYS. Performed at [facility], [place] [address] [place], [place] [address]    Report Status 12/19/2021 FINAL  Final  Blood Culture (routine x 2)     Status: Abnormal   Collection Time: 12/16/21 12:24 PM   Specimen: BLOOD  Result Value Ref Range Status   Specimen Description   Final    BLOOD BLOOD RIGHT FOREARM Performed at [facility], [place] [address], [place], [place] [address]    Special Requests   Final    BOTTLES DRAWN AEROBIC AND ANAEROBIC Blood Culture results may not be optimal due to an inadequate volume of blood received in culture bottles Performed at [facility], [address] [place], [place] [address]    Culture  Setup Time   Final    GRAM POSITIVE COCCI IN BOTH AEROBIC AND ANAEROBIC BOTTLES Organism ID to follow CRITICAL RESULT CALLED TO, READ BACK BY AND VERIFIED WITH: JASON ROBBINS@0626  12/17/21 RH Performed at [facility], [address] [place], [place] [address]    Culture STAPHYLOCOCCUS EPIDERMIDIS (A)  Final   Report Status 12/19/2021 FINAL  Final   Organism ID, Bacteria STAPHYLOCOCCUS EPIDERMIDIS  Final      Susceptibility   Staphylococcus epidermidis - MIC*    CIPROFLOXACIN <=0.5 SENSITIVE Sensitive     ERYTHROMYCIN >=8 RESISTANT Resistant     GENTAMICIN  <=0.5 SENSITIVE Sensitive  OXACILLIN >=4 RESISTANT Resistant     TETRACYCLINE <=1 SENSITIVE Sensitive     VANCOMYCIN 1 SENSITIVE Sensitive     TRIMETH/SULFA <=10 SENSITIVE Sensitive     CLINDAMYCIN >=8 RESISTANT Resistant     RIFAMPIN <=0.5 SENSITIVE Sensitive     Inducible Clindamycin NEGATIVE Sensitive     * STAPHYLOCOCCUS EPIDERMIDIS  Blood Culture ID Panel (Reflexed)     Status: Abnormal   Collection Time: 12/16/21 12:24 PM  Result Value Ref Range Status   Enterococcus faecalis NOT DETECTED NOT DETECTED Final   Enterococcus Faecium NOT DETECTED NOT DETECTED Final   Listeria monocytogenes NOT DETECTED NOT DETECTED Final   Staphylococcus species DETECTED (A) NOT DETECTED Final    Comment: CRITICAL RESULT CALLED TO, READ BACK BY AND VERIFIED WITH: JASON ROBBINS@0626  12/17/21 RH    Staphylococcus aureus (BCID) NOT DETECTED NOT DETECTED Final   Staphylococcus epidermidis DETECTED (A) NOT DETECTED Final    Comment: Methicillin (oxacillin) resistant coagulase negative staphylococcus. Possible blood culture contaminant (unless isolated from more than one blood culture draw or clinical case suggests pathogenicity). No antibiotic treatment is indicated for blood  culture contaminants. CRITICAL RESULT CALLED TO, READ BACK BY AND VERIFIED WITH: JASON ROBBINS@0626  12/17/21 RH    Staphylococcus lugdunensis NOT DETECTED NOT DETECTED Final   Streptococcus species NOT DETECTED NOT DETECTED Final   Streptococcus agalactiae NOT DETECTED NOT DETECTED Final   Streptococcus pneumoniae NOT DETECTED NOT DETECTED Final   Streptococcus pyogenes NOT DETECTED NOT DETECTED Final   A.calcoaceticus-baumannii NOT DETECTED NOT DETECTED Final   Bacteroides fragilis NOT DETECTED NOT DETECTED Final   Enterobacterales NOT DETECTED NOT DETECTED Final   Enterobacter cloacae complex NOT DETECTED NOT DETECTED Final   Escherichia coli NOT DETECTED NOT DETECTED Final   Klebsiella aerogenes NOT DETECTED NOT DETECTED  Final   Klebsiella oxytoca NOT DETECTED NOT DETECTED Final   Klebsiella pneumoniae NOT DETECTED NOT DETECTED Final   Proteus species NOT DETECTED NOT DETECTED Final   Salmonella species NOT DETECTED NOT DETECTED Final   Serratia marcescens NOT DETECTED NOT DETECTED Final   Haemophilus influenzae NOT DETECTED NOT DETECTED Final   Neisseria meningitidis NOT DETECTED NOT DETECTED Final   Pseudomonas aeruginosa NOT DETECTED NOT DETECTED Final   Stenotrophomonas maltophilia NOT DETECTED NOT DETECTED Final   Candida albicans NOT DETECTED NOT DETECTED Final   Candida auris NOT DETECTED NOT DETECTED Final   Candida glabrata NOT DETECTED NOT DETECTED Final   Candida krusei NOT DETECTED NOT DETECTED Final   Candida parapsilosis NOT DETECTED NOT DETECTED Final   Candida tropicalis NOT DETECTED NOT DETECTED Final   Cryptococcus neoformans/gattii NOT DETECTED NOT DETECTED Final   Methicillin resistance mecA/C DETECTED (A) NOT DETECTED Final    Comment: CRITICAL RESULT CALLED TO, READ BACK BY AND VERIFIED WITH: JASON ROBBINS@0626  12/17/21 RH Performed at [facility], [address] [place], [place] [address]   Resp Panel by RT-PCR (Flu A&B, Covid) Anterior Nasal Swab     Status: None   Collection Time: 12/16/21  1:10 PM   Specimen: Anterior Nasal Swab  Result Value Ref Range Status   SARS Coronavirus 2 by RT PCR NEGATIVE NEGATIVE Final    Comment: (NOTE) SARS-CoV-2 target nucleic acids are NOT DETECTED.  The SARS-CoV-2 RNA is generally detectable in upper respiratory specimens during the acute phase of infection. The lowest concentration of SARS-CoV-2 viral copies this assay can detect is 138 copies/mL. A negative result does not preclude SARS-Cov-2 infection and should not be used as  the sole basis for treatment or other patient management decisions. A negative result may occur with  improper specimen collection/handling, submission of specimen other than nasopharyngeal swab,  presence of viral mutation(s) within the areas targeted by this assay, and inadequate number of viral copies(<138 copies/mL). A negative result must be combined with clinical observations, patient history, and epidemiological information. The expected result is Negative.  Fact Sheet for Patients:  EntrepreneurPulse.com.au  Fact Sheet for Healthcare Providers:  IncredibleEmployment.be  This test is no t yet approved or cleared by the [place] FDA and  has been authorized for detection and/or diagnosis of SARS-CoV-2 by FDA under an Emergency Use Authorization (EUA). This EUA will remain  in effect (meaning this test can be used) for the duration of the COVID-19 declaration under Section 564(b)(1) of the Act, 21 U.S.C.section 360bbb-3(b)(1), unless the authorization is terminated  or revoked sooner.       Influenza A by PCR NEGATIVE NEGATIVE Final   Influenza B by PCR NEGATIVE NEGATIVE Final    Comment: (NOTE) The Xpert Xpress SARS-CoV-2/FLU/RSV plus assay is intended as an aid in the diagnosis of influenza from Nasopharyngeal swab specimens and should not be used as a sole basis for treatment. Nasal washings and aspirates are unacceptable for Xpert Xpress SARS-CoV-2/FLU/RSV testing.  Fact Sheet for Patients: EntrepreneurPulse.com.au  Fact Sheet for Healthcare Providers: IncredibleEmployment.be  This test is not yet approved or cleared by the [place] FDA and has been authorized for detection and/or diagnosis of SARS-CoV-2 by FDA under an Emergency Use Authorization (EUA). This EUA will remain in effect (meaning this test can be used) for the duration of the COVID-19 declaration under Section 564(b)(1) of the Act, 21 U.S.C. section 360bbb-3(b)(1), unless the authorization is terminated or revoked.  Performed at [facility], [address] [place], [place] [address]   Culture, blood  (Routine X 2) w Reflex to ID Panel     Status: None (Preliminary result)   Collection Time: 12/19/21  4:12 PM   Specimen: BLOOD RIGHT HAND  Result Value Ref Range Status   Specimen Description BLOOD RIGHT HAND  Final   Special Requests   Final    BOTTLES DRAWN AEROBIC AND ANAEROBIC Blood Culture adequate volume   Culture   Final    NO GROWTH 3 DAYS Performed at [facility], [address] [place], [place] [address]    Report Status PENDING  Incomplete  Culture, blood (Routine X 2) w Reflex to ID Panel     Status: None (Preliminary result)   Collection Time: 12/19/21  4:18 PM   Specimen: BLOOD RIGHT HAND  Result Value Ref Range Status   Specimen Description BLOOD RIGHT HAND  Final   Special Requests   Final    BOTTLES DRAWN AEROBIC AND ANAEROBIC Blood Culture adequate volume   Culture   Final    NO GROWTH 3 DAYS Performed at [facility], [address] [place], [place] [address]    Report Status PENDING  Incomplete     Labs: BNP (last 3 results) Recent Labs    02/20/21 2123 06/05/21 0125 12/04/21 1054  BNP >4,500.0* 2,807.3* 1,324.4*   Basic Metabolic Panel: Recent Labs  Lab 12/16/21 1224 12/17/21 0501 12/18/21 0721 12/20/21 0746  NA 141 141 138 138  K 3.7 3.6 4.0 3.9  CL 103 101 101 102  CO2 28 28 27 28   GLUCOSE 101* 72 78 94  BUN 31* 19 28* 29*  CREATININE 3.89* 2.80* 3.59*  3.68*  CALCIUM 9.5 9.0 9.0 9.2   Liver Function Tests: Recent Labs  Lab 12/16/21 1224  AST 16  ALT 7  ALKPHOS 70  BILITOT 1.3*  PROT 6.1*  ALBUMIN 3.4*   No results for input(s): "LIPASE", "AMYLASE" in the last 168 hours. Recent Labs  Lab 12/16/21 1224  AMMONIA 36*   CBC: Recent Labs  Lab 12/16/21 1224 12/17/21 0501 12/18/21 0721 12/20/21 0746 12/21/21 0630  WBC 6.7 4.7 4.2 6.1 4.5  NEUTROABS 3.9  --   --   --   --   HGB 12.0* 10.4* 10.3* 10.9* 11.3*  HCT 42.1 36.3* 35.6* 37.4* 38.8*  MCV 109.9* 111.0* 110.6* 109.0* 107.8*  PLT 80* 63* 20*  53* 44*   Cardiac Enzymes: No results for input(s): "CKTOTAL", "CKMB", "CKMBINDEX", "TROPONINI" in the last 168 hours. BNP: Invalid input(s): "POCBNP" CBG: Recent Labs  Lab 12/17/21 0113 12/18/21 0250 12/20/21 2041  GLUCAP 80 89 108*   D-Dimer No results for input(s): "DDIMER" in the last 72 hours. Hgb A1c No results for input(s): "HGBA1C" in the last 72 hours. Lipid Profile No results for input(s): "CHOL", "HDL", "LDLCALC", "TRIG", "CHOLHDL", "LDLDIRECT" in the last 72 hours. Thyroid function studies No results for input(s): "TSH", "T4TOTAL", "T3FREE", "THYROIDAB" in the last 72 hours.  Invalid input(s): "FREET3" Anemia work up No results for input(s): "VITAMINB12", "FOLATE", "FERRITIN", "TIBC", "IRON", "RETICCTPCT" in the last 72 hours. Urinalysis    Component Value Date/Time   COLORURINE RED (A) 12/04/2021 1316   APPEARANCEUR TURBID (A) 12/04/2021 1316   APPEARANCEUR Hazy 11/14/2012 2044   LABSPEC 1.012 12/04/2021 1316   LABSPEC 1.016 11/14/2012 2044   PHURINE  12/04/2021 1316    TEST NOT REPORTED DUE TO COLOR INTERFERENCE OF URINE PIGMENT   GLUCOSEU (A) 12/04/2021 1316    TEST NOT REPORTED DUE TO COLOR INTERFERENCE OF URINE PIGMENT   GLUCOSEU 150 mg/dL 11/14/2012 2044   HGBUR (A) 12/04/2021 1316    TEST NOT REPORTED DUE TO COLOR INTERFERENCE OF URINE PIGMENT   BILIRUBINUR (A) 12/04/2021 1316    TEST NOT REPORTED DUE TO COLOR INTERFERENCE OF URINE PIGMENT   BILIRUBINUR Negative 11/14/2012 2044   KETONESUR (A) 12/04/2021 1316    TEST NOT REPORTED DUE TO COLOR INTERFERENCE OF URINE PIGMENT   PROTEINUR (A) 12/04/2021 1316    TEST NOT REPORTED DUE TO COLOR INTERFERENCE OF URINE PIGMENT   NITRITE (A) 12/04/2021 1316    TEST NOT REPORTED DUE TO COLOR INTERFERENCE OF URINE PIGMENT   LEUKOCYTESUR (A) 12/04/2021 1316    TEST NOT REPORTED DUE TO COLOR INTERFERENCE OF URINE PIGMENT   LEUKOCYTESUR Trace 11/14/2012 2044   Sepsis Labs Recent Labs  Lab 12/17/21 0501  12/18/21 0721 12/20/21 0746 12/21/21 0630  WBC 4.7 4.2 6.1 4.5   Microbiology Recent Results (from the past 240 hour(s))  Blood Culture (routine x 2)     Status: Abnormal   Collection Time: 12/16/21 12:24 PM   Specimen: BLOOD  Result Value Ref Range Status   Specimen Description   Final    BLOOD RIGHT [facility] Performed at [facility], [address], [place], [place] [address]    Special Requests   Final    BOTTLES DRAWN AEROBIC AND ANAEROBIC Blood Culture results may not be optimal due to an inadequate volume of blood received in culture bottles Performed at [facility], [address] [place], [place] [address]    Culture  Setup Time   Final    Lonell Grandchild  POSITIVE COCCI IN BOTH AEROBIC AND ANAEROBIC BOTTLES CRITICAL VALUE NOTED.  VALUE IS CONSISTENT WITH PREVIOUSLY REPORTED AND CALLED VALUE. Performed at [facility], [address] [place], [place] [address]    Culture (A)  Final    STAPHYLOCOCCUS EPIDERMIDIS SUSCEPTIBILITIES PERFORMED ON PREVIOUS CULTURE WITHIN THE LAST 5 DAYS. Performed at [facility], [place] [address] [address], [place], [place] [address]    Report Status 12/19/2021 FINAL  Final  Blood Culture (routine x 2)     Status: Abnormal   Collection Time: 12/16/21 12:24 PM   Specimen: BLOOD  Result Value Ref Range Status   Specimen Description   Final    BLOOD BLOOD RIGHT FOREARM Performed at [facility], [place] [address], [place], [place] [address]    Special Requests   Final    BOTTLES DRAWN AEROBIC AND ANAEROBIC Blood Culture results may not be optimal due to an inadequate volume of blood received in culture bottles Performed at [facility], [address] [place], [place] [address]    Culture  Setup Time   Final    GRAM POSITIVE COCCI IN BOTH AEROBIC AND ANAEROBIC BOTTLES Organism ID to follow CRITICAL RESULT CALLED TO, READ BACK BY AND VERIFIED WITH: JASON ROBBINS@0626  12/17/21 RH Performed at [facility], [address] [place], [place] [address]    Culture STAPHYLOCOCCUS EPIDERMIDIS (A)  Final   Report Status 12/19/2021 FINAL  Final   Organism ID, Bacteria STAPHYLOCOCCUS EPIDERMIDIS  Final      Susceptibility   Staphylococcus epidermidis - MIC*    CIPROFLOXACIN <=0.5 SENSITIVE Sensitive     ERYTHROMYCIN >=8 RESISTANT Resistant     GENTAMICIN <=0.5 SENSITIVE Sensitive     OXACILLIN >=4 RESISTANT Resistant     TETRACYCLINE <=1 SENSITIVE Sensitive     VANCOMYCIN 1 SENSITIVE Sensitive     TRIMETH/SULFA <=10 SENSITIVE Sensitive     CLINDAMYCIN >=8 RESISTANT Resistant     RIFAMPIN <=0.5 SENSITIVE Sensitive     Inducible Clindamycin NEGATIVE Sensitive     * STAPHYLOCOCCUS EPIDERMIDIS  Blood Culture ID Panel (Reflexed)     Status: Abnormal   Collection Time: 12/16/21 12:24 PM  Result Value Ref Range Status   Enterococcus faecalis NOT DETECTED NOT DETECTED Final   Enterococcus Faecium NOT DETECTED NOT DETECTED Final   Listeria monocytogenes NOT DETECTED NOT DETECTED Final   Staphylococcus species DETECTED (A) NOT DETECTED Final    Comment: CRITICAL RESULT CALLED TO, READ BACK BY AND VERIFIED WITH: JASON ROBBINS@0626  12/17/21 RH    Staphylococcus aureus (BCID) NOT DETECTED NOT DETECTED Final   Staphylococcus epidermidis DETECTED (A) NOT DETECTED Final    Comment: Methicillin (oxacillin) resistant coagulase negative staphylococcus. Possible blood culture contaminant (unless isolated from more than one blood culture draw or clinical case suggests pathogenicity). No antibiotic treatment is indicated for blood  culture contaminants. CRITICAL RESULT CALLED TO, READ BACK BY AND VERIFIED WITH: JASON ROBBINS@0626  12/17/21 RH    Staphylococcus lugdunensis NOT DETECTED NOT DETECTED Final   Streptococcus species NOT DETECTED NOT DETECTED Final   Streptococcus agalactiae NOT DETECTED NOT DETECTED Final   Streptococcus pneumoniae NOT DETECTED NOT DETECTED Final   Streptococcus pyogenes NOT  DETECTED NOT DETECTED Final   A.calcoaceticus-baumannii NOT DETECTED NOT DETECTED Final   Bacteroides fragilis NOT DETECTED NOT DETECTED Final   Enterobacterales NOT DETECTED NOT DETECTED Final   Enterobacter cloacae complex NOT DETECTED NOT DETECTED Final   Escherichia coli NOT DETECTED NOT DETECTED Final  Klebsiella aerogenes NOT DETECTED NOT DETECTED Final   Klebsiella oxytoca NOT DETECTED NOT DETECTED Final   Klebsiella pneumoniae NOT DETECTED NOT DETECTED Final   Proteus species NOT DETECTED NOT DETECTED Final   Salmonella species NOT DETECTED NOT DETECTED Final   Serratia marcescens NOT DETECTED NOT DETECTED Final   Haemophilus influenzae NOT DETECTED NOT DETECTED Final   Neisseria meningitidis NOT DETECTED NOT DETECTED Final   Pseudomonas aeruginosa NOT DETECTED NOT DETECTED Final   Stenotrophomonas maltophilia NOT DETECTED NOT DETECTED Final   Candida albicans NOT DETECTED NOT DETECTED Final   Candida auris NOT DETECTED NOT DETECTED Final   Candida glabrata NOT DETECTED NOT DETECTED Final   Candida krusei NOT DETECTED NOT DETECTED Final   Candida parapsilosis NOT DETECTED NOT DETECTED Final   Candida tropicalis NOT DETECTED NOT DETECTED Final   Cryptococcus neoformans/gattii NOT DETECTED NOT DETECTED Final   Methicillin resistance mecA/C DETECTED (A) NOT DETECTED Final    Comment: CRITICAL RESULT CALLED TO, READ BACK BY AND VERIFIED WITH: JASON ROBBINS@0626  12/17/21 RH Performed at [facility], [address] [place], [place] [address]   Resp Panel by RT-PCR (Flu A&B, Covid) Anterior Nasal Swab     Status: None   Collection Time: 12/16/21  1:10 PM   Specimen: Anterior Nasal Swab  Result Value Ref Range Status   SARS Coronavirus 2 by RT PCR NEGATIVE NEGATIVE Final    Comment: (NOTE) SARS-CoV-2 target nucleic acids are NOT DETECTED.  The SARS-CoV-2 RNA is generally detectable in upper respiratory specimens during the acute phase of infection. The  lowest concentration of SARS-CoV-2 viral copies this assay can detect is 138 copies/mL. A negative result does not preclude SARS-Cov-2 infection and should not be used as the sole basis for treatment or other patient management decisions. A negative result may occur with  improper specimen collection/handling, submission of specimen other than nasopharyngeal swab, presence of viral mutation(s) within the areas targeted by this assay, and inadequate number of viral copies(<138 copies/mL). A negative result must be combined with clinical observations, patient history, and epidemiological information. The expected result is Negative.  Fact Sheet for Patients:  EntrepreneurPulse.com.au  Fact Sheet for Healthcare Providers:  IncredibleEmployment.be  This test is no t yet approved or cleared by the [place] FDA and  has been authorized for detection and/or diagnosis of SARS-CoV-2 by FDA under an Emergency Use Authorization (EUA). This EUA will remain  in effect (meaning this test can be used) for the duration of the COVID-19 declaration under Section 564(b)(1) of the Act, 21 U.S.C.section 360bbb-3(b)(1), unless the authorization is terminated  or revoked sooner.       Influenza A by PCR NEGATIVE NEGATIVE Final   Influenza B by PCR NEGATIVE NEGATIVE Final    Comment: (NOTE) The Xpert Xpress SARS-CoV-2/FLU/RSV plus assay is intended as an aid in the diagnosis of influenza from Nasopharyngeal swab specimens and should not be used as a sole basis for treatment. Nasal washings and aspirates are unacceptable for Xpert Xpress SARS-CoV-2/FLU/RSV testing.  Fact Sheet for Patients: EntrepreneurPulse.com.au  Fact Sheet for Healthcare Providers: IncredibleEmployment.be  This test is not yet approved or cleared by the [place] FDA and has been authorized for detection and/or diagnosis of SARS-CoV-2 by FDA under  an Emergency Use Authorization (EUA). This EUA will remain in effect (meaning this test can be used) for the duration of the COVID-19 declaration under Section 564(b)(1) of the Act, 21 U.S.C. section 360bbb-3(b)(1), unless the authorization is terminated or revoked.  Performed at [facility], [address] [place], [place] [address]   Culture, blood (Routine X 2) w Reflex to ID Panel     Status: None (Preliminary result)   Collection Time: 12/19/21  4:12 PM   Specimen: BLOOD RIGHT HAND  Result Value Ref Range Status   Specimen Description BLOOD RIGHT HAND  Final   Special Requests   Final    BOTTLES DRAWN AEROBIC AND ANAEROBIC Blood Culture adequate volume   Culture   Final    NO GROWTH 3 DAYS Performed at [facility], [address], [place], [place] [address]    Report Status PENDING  Incomplete  Culture, blood (Routine X 2) w Reflex to ID Panel     Status: None (Preliminary result)   Collection Time: 12/19/21  4:18 PM   Specimen: BLOOD RIGHT HAND  Result Value Ref Range Status   Specimen Description BLOOD RIGHT HAND  Final   Special Requests   Final    BOTTLES DRAWN AEROBIC AND ANAEROBIC Blood Culture adequate volume   Culture   Final    NO GROWTH 3 DAYS Performed at [facility], [address] [place], [place] [address]    Report Status PENDING  Incomplete   Imaging ECHOCARDIOGRAM COMPLETE  Result Date: 12/17/2021    ECHOCARDIOGRAM REPORT   Patient Name:   Antonio Phillips [facility] Date of Exam: 12/17/2021 Medical Rec #:  [id number]          Height:       51.0 in Accession #:    [id number]         Weight:       212.0 lb Date of Birth:  December 23, 1945          BSA:          1.700 m Patient Age:    66 years           BP:           90/77 mmHg Patient Gender: M                  HR:           89 bpm. Exam Location:  [facility] Procedure: 2D Echo Indications:    chest pain  History:        Patient has prior history of Echocardiogram examinations, most                  recent 02/22/2021. CHF, Arrythmias:Atrial Fibrillation and                 Bradycardia; Signs/Symptoms:Shortness of Breath and Chest Pain.  Sonographer:    Harvie Junior Referring Phys: [phone] Minna Merritts  Sonographer Comments: Technically difficult study due to poor echo windows and patient is obese. Image acquisition challenging due to patient body habitus and supine. IMPRESSIONS  1. Left ventricular ejection fraction, by estimation, is 55%. The left ventricle has normal function. The left ventricle has no regional wall motion abnormalities. There is mild left ventricular hypertrophy. Left ventricular diastolic parameters are indeterminate.  2. Right ventricular systolic function is normal. The right ventricular size is moderately enlarged. There is moderately elevated pulmonary artery systolic pressure. The estimated right ventricular systolic pressure is 35.5 mmHg.  3. Left atrial size was mild to moderately dilated.  4. Right atrial size was mildly dilated.  5. Moderate pleural effusion in the left lateral region.  6. The mitral valve is normal in structure. Mild mitral valve regurgitation.  No evidence of mitral stenosis. Moderate mitral annular calcification.  7. The aortic valve is normal in structure. There is moderate calcification of the aortic valve. Aortic valve regurgitation is mild. Aortic valve sclerosis/calcification is present, with very mild aortic stenosis. Aortic valve mean gradient measures 12.5 mmHg. Aortic valve Vmax measures 2.87 m/s.  8. The inferior vena cava is normal in size with greater than 50% respiratory variability, suggesting right atrial pressure of 3 mmHg. FINDINGS  Left Ventricle: Left ventricular ejection fraction, by estimation, is 55 %. The left ventricle has normal function. The left ventricle has no regional wall motion abnormalities. The left ventricular internal cavity size was normal in size. There is mild  left ventricular hypertrophy. Left ventricular diastolic  parameters are indeterminate. Right Ventricle: The right ventricular size is moderately enlarged. No increase in right ventricular wall thickness. Right ventricular systolic function is normal. There is moderately elevated pulmonary artery systolic pressure. The tricuspid regurgitant  velocity is 3.47 m/s, and with an assumed right atrial pressure of 10 mmHg, the estimated right ventricular systolic pressure is 19.1 mmHg. Left Atrium: Left atrial size was mild to moderately dilated. Right Atrium: Right atrial size was mildly dilated. Pericardium: There is no evidence of pericardial effusion. Mitral Valve: The mitral valve is normal in structure. Moderate mitral annular calcification. Mild mitral valve regurgitation. No evidence of mitral valve stenosis. Tricuspid Valve: The tricuspid valve is normal in structure. Tricuspid valve regurgitation is not demonstrated. No evidence of tricuspid stenosis. Aortic Valve: The aortic valve is normal in structure. There is moderate calcification of the aortic valve. Aortic valve regurgitation is mild. Aortic valve sclerosis/calcification is present, without any evidence of aortic stenosis. Aortic valve mean gradient measures 12.5 mmHg. Aortic valve peak gradient measures 25.7 mmHg. Aortic valve area, by VTI measures 2.39 cm. Pulmonic Valve: The pulmonic valve was normal in structure. Pulmonic valve regurgitation is mild. No evidence of pulmonic stenosis. Aorta: The aortic root is normal in size and structure. Venous: The inferior vena cava is normal in size with greater than 50% respiratory variability, suggesting right atrial pressure of 3 mmHg. IAS/Shunts: No atrial level shunt detected by color flow Doppler. Additional Comments: There is a moderate pleural effusion in the left lateral region.  LEFT VENTRICLE PLAX 2D LVIDd:         3.90 cm     Diastology LVIDs:         3.20 cm     LV e' medial:    8.92 cm/s LV PW:         1.20 cm     LV E/e' medial:  13.7 LV IVS:        1.20  cm     LV e' lateral:   12.60 cm/s LVOT diam:     1.90 cm     LV E/e' lateral: 9.7 LV SV:         98 LV SV Index:   57 LVOT Area:     2.84 cm  LV Volumes (MOD) LV vol d, MOD A2C: 67.8 ml LV vol d, MOD A4C: 63.4 ml LV vol s, MOD A2C: 35.1 ml LV vol s, MOD A4C: 28.4 ml LV SV MOD A2C:     32.7 ml LV SV MOD A4C:     63.4 ml LV SV MOD BP:      33.8 ml RIGHT VENTRICLE RV Basal diam:  4.70 cm RV Mid diam:    4.30 cm RV S prime:     5.44  cm/s TAPSE (M-mode): 1.2 cm LEFT ATRIUM             Index        RIGHT ATRIUM           Index LA diam:        3.90 cm 2.29 cm/m   RA Area:     27.00 cm LA Vol (A2C):   72.1 ml 42.40 ml/m  RA Volume:   84.20 ml  49.52 ml/m LA Vol (A4C):   77.5 ml 45.58 ml/m LA Biplane Vol: 82.7 ml 48.64 ml/m  AORTIC VALVE                     PULMONIC VALVE AV Area (Vmax):    2.16 cm      PV Vmax:          1.34 m/s AV Area (Vmean):   2.27 cm      PV Peak grad:     7.2 mmHg AV Area (VTI):     2.39 cm      PR End Diast Vel: 3.96 msec AV Vmax:           253.40 cm/s AV Vmean:          163.500 cm/s AV VTI:            0.409 m AV Peak Grad:      25.7 mmHg AV Mean Grad:      12.5 mmHg LVOT Vmax:         193.00 cm/s LVOT Vmean:        131.000 cm/s LVOT VTI:          0.344 m LVOT/AV VTI ratio: 0.84  AORTA Ao Root diam: 3.20 cm MITRAL VALVE                TRICUSPID VALVE MV Area (PHT): 4.49 cm     TR Peak grad:   48.2 mmHg MV Decel Time: 169 msec     TR Vmax:        347.00 cm/s MR Peak grad: 48.2 mmHg MR Vmax:      347.25 cm/s   SHUNTS MV E velocity: 122.00 cm/s  Systemic VTI:  0.34 m MV A velocity: 47.20 cm/s   Systemic Diam: 1.90 cm MV E/A ratio:  2.58 Ida Rogue MD Electronically signed by Ida Rogue MD Signature Date/Time: 12/17/2021/4:18:37 PM    Final       Time coordinating discharge: over 30 minutes  SIGNED:  Emeterio Reeve DO [facility]

## 2021-12-22 NOTE — Progress Notes (Signed)
[facility]  ROUNDING NOTE   Subjective:   Antonio Phillips is a 76 year old male with past medical conditions including diabetes, hyperlipidemia, CAD, hypertension, atrial fibrillation on Eliquis, and end-stage renal disease on hemodialysis.  Patient presents to the emergency department from his dialysis clinic with complaints of chest pain/pressure, hypotension, and decreased mentation.  Patient has been admitted for Acute respiratory failure with hypercapnia ([place]) [J96.02] Acute encephalopathy [G93.40] CO2 narcosis [R06.89] ESRD on hemodialysis ([place]) [N18.6, V78.4] Acute metabolic encephalopathy [O96.29]  Patient is known to our practice and receives outpatient dialysis treatments at [facility] on a TTS schedule, supervised by Dr. Candiss Norse.   Patient was seen on second floor Patient resting comfortably  Objective:  Vital signs in last 24 hours:  Temp:  [97.6 F (36.4 C)-98.8 F (37.1 C)] 97.6 F (36.4 C) (10/18 0748) Pulse Rate:  [63-94] 63 (10/18 0748) Resp:  [10-20] 16 (10/18 0748) BP: (88-107)/(48-70) 102/70 (10/18 0748) SpO2:  [96 %-100 %] 100 % (10/18 0748) Weight:  [92.2 kg-94.5 kg] 92.2 kg (10/17 1329)  Weight change:  Filed Weights   12/18/21 0328 12/21/21 0943 12/21/21 1329  Weight: 95.8 kg 94.5 kg 92.2 kg    Intake/Output: I/O last 3 completed shifts: In: -  Out: 2002 [Other:2000; Stool:2]   Intake/Output this shift:  No intake/output data recorded.  Physical Exam: General:  NAD  Head: Normocephalic, atraumatic.   Eyes: Anicteric  Lungs:  Crackles Present, normal effort, [place] O2, Decreased breath sounds at bases  Heart: Irregular rate and rhythm  Abdomen:  Soft, nontender, obese  Extremities: 1+ peripheral edema.  Neurologic: Nonfocal, moving all four extremities  Skin: No lesions  Access: Left upper AVF    Basic Metabolic Panel: Recent Labs  Lab 12/16/21 1224 12/17/21 0501 12/18/21 0721 12/20/21 0746  NA 141 141 138  138  K 3.7 3.6 4.0 3.9  CL 103 101 101 102  CO2 28 28 27 28   GLUCOSE 101* 72 78 94  BUN 31* 19 28* 29*  CREATININE 3.89* 2.80* 3.59* 3.68*  CALCIUM 9.5 9.0 9.0 9.2    Liver Function Tests: Recent Labs  Lab 12/16/21 1224  AST 16  ALT 7  ALKPHOS 70  BILITOT 1.3*  PROT 6.1*  ALBUMIN 3.4*   No results for input(s): "LIPASE", "AMYLASE" in the last 168 hours. Recent Labs  Lab 12/16/21 1224  AMMONIA 36*    CBC: Recent Labs  Lab 12/16/21 1224 12/17/21 0501 12/18/21 0721 12/20/21 0746 12/21/21 0630  WBC 6.7 4.7 4.2 6.1 4.5  NEUTROABS 3.9  --   --   --   --   HGB 12.0* 10.4* 10.3* 10.9* 11.3*  HCT 42.1 36.3* 35.6* 37.4* 38.8*  MCV 109.9* 111.0* 110.6* 109.0* 107.8*  PLT 80* 63* 20* 53* 44*    Cardiac Enzymes: No results for input(s): "CKTOTAL", "CKMB", "CKMBINDEX", "TROPONINI" in the last 168 hours.  BNP: Invalid input(s): "POCBNP"  CBG: Recent Labs  Lab 12/17/21 0113 12/18/21 0250 12/20/21 2041  GLUCAP 80 76 108*    Microbiology: Results for orders placed or performed during the hospital encounter of 12/16/21  Blood Culture (routine x 2)     Status: Abnormal   Collection Time: 12/16/21 12:24 PM   Specimen: BLOOD  Result Value Ref Range Status   Specimen Description   Final    BLOOD RIGHT [facility] Performed at [facility], [address] [place],  [address]    Special Requests   Final    BOTTLES  DRAWN AEROBIC AND ANAEROBIC Blood Culture results may not be optimal due to an inadequate volume of blood received in culture bottles Performed at [facility], [address] [place], [place] [address]    Culture  Setup Time   Final    GRAM POSITIVE COCCI IN BOTH AEROBIC AND ANAEROBIC BOTTLES CRITICAL VALUE NOTED.  VALUE IS CONSISTENT WITH PREVIOUSLY REPORTED AND CALLED VALUE. Performed at [facility], [address] [place], [place] [address]    Culture (A)  Final    STAPHYLOCOCCUS EPIDERMIDIS SUSCEPTIBILITIES  PERFORMED ON PREVIOUS CULTURE WITHIN THE LAST 5 DAYS. Performed at [facility], [place] [address] [place], [place] [address]    Report Status 12/19/2021 FINAL  Final  Blood Culture (routine x 2)     Status: Abnormal   Collection Time: 12/16/21 12:24 PM   Specimen: BLOOD  Result Value Ref Range Status   Specimen Description   Final    BLOOD BLOOD RIGHT FOREARM Performed at [facility], [place] [address] [place], [place] [address]    Special Requests   Final    BOTTLES DRAWN AEROBIC AND ANAEROBIC Blood Culture results may not be optimal due to an inadequate volume of blood received in culture bottles Performed at [facility], [address] [place], [place] [address]    Culture  Setup Time   Final    GRAM POSITIVE COCCI IN BOTH AEROBIC AND ANAEROBIC BOTTLES Organism ID to follow CRITICAL RESULT CALLED TO, READ BACK BY AND VERIFIED WITH: JASON ROBBINS@0626  12/17/21 RH Performed at [facility], [address] [place], [place] [address]    Culture STAPHYLOCOCCUS EPIDERMIDIS (A)  Final   Report Status 12/19/2021 FINAL  Final   Organism ID, Bacteria STAPHYLOCOCCUS EPIDERMIDIS  Final      Susceptibility   Staphylococcus epidermidis - MIC*    CIPROFLOXACIN <=0.5 SENSITIVE Sensitive     ERYTHROMYCIN >=8 RESISTANT Resistant     GENTAMICIN <=0.5 SENSITIVE Sensitive     OXACILLIN >=4 RESISTANT Resistant     TETRACYCLINE <=1 SENSITIVE Sensitive     VANCOMYCIN 1 SENSITIVE Sensitive     TRIMETH/SULFA <=10 SENSITIVE Sensitive     CLINDAMYCIN >=8 RESISTANT Resistant     RIFAMPIN <=0.5 SENSITIVE Sensitive     Inducible Clindamycin NEGATIVE Sensitive     * STAPHYLOCOCCUS EPIDERMIDIS  Blood Culture ID Panel (Reflexed)     Status: Abnormal   Collection Time: 12/16/21 12:24 PM  Result Value Ref Range Status   Enterococcus faecalis NOT DETECTED NOT DETECTED Final   Enterococcus Faecium NOT DETECTED NOT DETECTED Final   Listeria monocytogenes NOT DETECTED NOT  DETECTED Final   Staphylococcus species DETECTED (A) NOT DETECTED Final    Comment: CRITICAL RESULT CALLED TO, READ BACK BY AND VERIFIED WITH: JASON ROBBINS@0626  12/17/21 RH    Staphylococcus aureus (BCID) NOT DETECTED NOT DETECTED Final   Staphylococcus epidermidis DETECTED (A) NOT DETECTED Final    Comment: Methicillin (oxacillin) resistant coagulase negative staphylococcus. Possible blood culture contaminant (unless isolated from more than one blood culture draw or clinical case suggests pathogenicity). No antibiotic treatment is indicated for blood  culture contaminants. CRITICAL RESULT CALLED TO, READ BACK BY AND VERIFIED WITH: JASON ROBBINS@0626  12/17/21 RH    Staphylococcus lugdunensis NOT DETECTED NOT DETECTED Final   Streptococcus species NOT DETECTED NOT DETECTED Final   Streptococcus agalactiae NOT DETECTED NOT DETECTED Final   Streptococcus pneumoniae NOT DETECTED NOT DETECTED Final   Streptococcus pyogenes NOT DETECTED NOT  DETECTED Final   A.calcoaceticus-baumannii NOT DETECTED NOT DETECTED Final   Bacteroides fragilis NOT DETECTED NOT DETECTED Final   Enterobacterales NOT DETECTED NOT DETECTED Final   Enterobacter cloacae complex NOT DETECTED NOT DETECTED Final   Escherichia coli NOT DETECTED NOT DETECTED Final   Klebsiella aerogenes NOT DETECTED NOT DETECTED Final   Klebsiella oxytoca NOT DETECTED NOT DETECTED Final   Klebsiella pneumoniae NOT DETECTED NOT DETECTED Final   Proteus species NOT DETECTED NOT DETECTED Final   Salmonella species NOT DETECTED NOT DETECTED Final   Serratia marcescens NOT DETECTED NOT DETECTED Final   Haemophilus influenzae NOT DETECTED NOT DETECTED Final   Neisseria meningitidis NOT DETECTED NOT DETECTED Final   Pseudomonas aeruginosa NOT DETECTED NOT DETECTED Final   Stenotrophomonas maltophilia NOT DETECTED NOT DETECTED Final   Candida albicans NOT DETECTED NOT DETECTED Final   Candida auris NOT DETECTED NOT DETECTED Final   Candida  glabrata NOT DETECTED NOT DETECTED Final   Candida krusei NOT DETECTED NOT DETECTED Final   Candida parapsilosis NOT DETECTED NOT DETECTED Final   Candida tropicalis NOT DETECTED NOT DETECTED Final   Cryptococcus neoformans/gattii NOT DETECTED NOT DETECTED Final   Methicillin resistance mecA/C DETECTED (A) NOT DETECTED Final    Comment: CRITICAL RESULT CALLED TO, READ BACK BY AND VERIFIED WITH: JASON ROBBINS@0626  12/17/21 RH Performed at [facility], [address] [place], [place] [address]   Resp Panel by RT-PCR (Flu A&B, Covid) Anterior Nasal Swab     Status: None   Collection Time: 12/16/21  1:10 PM   Specimen: Anterior Nasal Swab  Result Value Ref Range Status   SARS Coronavirus 2 by RT PCR NEGATIVE NEGATIVE Final    Comment: (NOTE) SARS-CoV-2 target nucleic acids are NOT DETECTED.  The SARS-CoV-2 RNA is generally detectable in upper respiratory specimens during the acute phase of infection. The lowest concentration of SARS-CoV-2 viral copies this assay can detect is 138 copies/mL. A negative result does not preclude SARS-Cov-2 infection and should not be used as the sole basis for treatment or other patient management decisions. A negative result may occur with  improper specimen collection/handling, submission of specimen other than nasopharyngeal swab, presence of viral mutation(s) within the areas targeted by this assay, and inadequate number of viral copies(<138 copies/mL). A negative result must be combined with clinical observations, patient history, and epidemiological information. The expected result is Negative.  Fact Sheet for Patients:  EntrepreneurPulse.com.au  Fact Sheet for Healthcare Providers:  IncredibleEmployment.be  This test is no t yet approved or cleared by the [place] FDA and  has been authorized for detection and/or diagnosis of SARS-CoV-2 by FDA under an Emergency Use Authorization (EUA). This  EUA will remain  in effect (meaning this test can be used) for the duration of the COVID-19 declaration under Section 564(b)(1) of the Act, 21 U.S.C.section 360bbb-3(b)(1), unless the authorization is terminated  or revoked sooner.       Influenza A by PCR NEGATIVE NEGATIVE Final   Influenza B by PCR NEGATIVE NEGATIVE Final    Comment: (NOTE) The Xpert Xpress SARS-CoV-2/FLU/RSV plus assay is intended as an aid in the diagnosis of influenza from Nasopharyngeal swab specimens and should not be used as a sole basis for treatment. Nasal washings and aspirates are unacceptable for Xpert Xpress SARS-CoV-2/FLU/RSV testing.  Fact Sheet for Patients: EntrepreneurPulse.com.au  Fact Sheet for Healthcare Providers: IncredibleEmployment.be  This test is not yet approved or cleared by the [place] FDA and has been authorized for detection  and/or diagnosis of SARS-CoV-2 by FDA under an Emergency Use Authorization (EUA). This EUA will remain in effect (meaning this test can be used) for the duration of the COVID-19 declaration under Section 564(b)(1) of the Act, 21 U.S.C. section 360bbb-3(b)(1), unless the authorization is terminated or revoked.  Performed at [facility], [address] [place], [place] [address]   Culture, blood (Routine X 2) w Reflex to ID Panel     Status: None (Preliminary result)   Collection Time: 12/19/21  4:12 PM   Specimen: BLOOD RIGHT HAND  Result Value Ref Range Status   Specimen Description BLOOD RIGHT HAND  Final   Special Requests   Final    BOTTLES DRAWN AEROBIC AND ANAEROBIC Blood Culture adequate volume   Culture   Final    NO GROWTH 2 DAYS Performed at [facility], [address], [place], [place] [address]    Report Status PENDING  Incomplete  Culture, blood (Routine X 2) w Reflex to ID Panel     Status: None (Preliminary result)   Collection Time: 12/19/21  4:18 PM   Specimen: BLOOD  RIGHT HAND  Result Value Ref Range Status   Specimen Description BLOOD RIGHT HAND  Final   Special Requests   Final    BOTTLES DRAWN AEROBIC AND ANAEROBIC Blood Culture adequate volume   Culture   Final    NO GROWTH 2 DAYS Performed at [facility], [address] [place],  [address]    Report Status PENDING  Incomplete    Coagulation Studies: No results for input(s): "LABPROT", "INR" in the last 72 hours.   Urinalysis: No results for input(s): "COLORURINE", "LABSPEC", "PHURINE", "GLUCOSEU", "HGBUR", "BILIRUBINUR", "KETONESUR", "PROTEINUR", "UROBILINOGEN", "NITRITE", "LEUKOCYTESUR" in the last 72 hours.  Invalid input(s): "APPERANCEUR"    Imaging: No results found.   Medications:    anticoagulant sodium citrate     [START ON 12/23/2021] vancomycin      artificial tears   Both Eyes BID   vitamin C  500 mg Oral BID   atorvastatin  40 mg Oral QHS   calcitRIOL  0.25 mcg Oral Daily   calcium acetate  1,334 mg Oral TID   Chlorhexidine Gluconate Cloth  6 each Topical Q0600   cyanocobalamin  1,000 mcg Oral Daily   folic acid  1 mg Oral Daily   Gerhardt's butt cream   Topical Q4H   midodrine  20 mg Oral TID WC   multivitamin  1 tablet Oral QHS   pregabalin  75 mg Oral QHS   rOPINIRole  1 mg Oral QHS   sertraline  50 mg Oral Daily   sevelamer carbonate  1,600 mg Oral 3 times per day on Sun Mon Wed Fri   acetaminophen, alteplase, anticoagulant sodium citrate, heparin, lactulose, lidocaine (PF), lidocaine-prilocaine, ondansetron **OR** ondansetron (ZOFRAN) IV, pentafluoroprop-tetrafluoroeth, polyvinyl alcohol  Assessment/ Plan:  [name] is a 76 y.o.  male with past medical conditions including diabetes, hyperlipidemia, CAD, hypertension, atrial fibrillation on Eliquis, and end-stage renal disease on hemodialysis.  Patient presents to the emergency department from his dialysis clinic with complaints of chest pain/pressure, hypotension, and  decreased mentation.  Patient has been admitted for Acute respiratory failure with hypercapnia ([place]) [J96.02] Acute encephalopathy [G93.40] CO2 narcosis [R06.89] ESRD on hemodialysis ([place]) [N18.6, Q25.9] Acute metabolic encephalopathy [D63.87]  [facility]/TTS/left AVF  Fluid volume overload with end-stage renal disease on hemodialysis.  Chest x-ray shows pulmonary vascular congestion.  Patient received urgent  dialysis At the time of admission.   Patient was last dialyzed yesterday No need for renal placement therapy today   2. Anemia of chronic kidney disease Lab Results  Component Value Date   HGB 11.3 (L) 12/21/2021    Hemoglobin at goal.  Patient receives [place] outpatient.  3. Secondary Hyperparathyroidism: with outpatient labs: PTH 172, phosphorus 5.3, calcium 8.3.  Lab Results  Component Value Date   CALCIUM 9.2 12/20/2021   PHOS 4.4 12/05/2021    We will continue to monitor bone minerals during this admission.  Continue sevelamer and calcium acetate with meals and daily calcitriol.  4. 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  Patient is being followed with the primary team  5.Thrombocytopenia Patient platelet count  low  Primary team following Will not use any heparin during dialysis  6.Pleural effusion Patient may need tap Primary team is following    LOS: 6 Alie Hardgrove s Erle Guster 10/18/20237:50 AM

## 2021-12-23 DIAGNOSIS — F418 Other specified anxiety disorders: Secondary | ICD-10-CM | POA: Diagnosis not present

## 2021-12-23 DIAGNOSIS — G2581 Restless legs syndrome: Secondary | ICD-10-CM | POA: Diagnosis not present

## 2021-12-23 DIAGNOSIS — D649 Anemia, unspecified: Secondary | ICD-10-CM | POA: Diagnosis not present

## 2021-12-23 DIAGNOSIS — G4733 Obstructive sleep apnea (adult) (pediatric): Secondary | ICD-10-CM | POA: Diagnosis not present

## 2021-12-23 DIAGNOSIS — E785 Hyperlipidemia, unspecified: Secondary | ICD-10-CM | POA: Diagnosis not present

## 2021-12-23 DIAGNOSIS — N186 End stage renal disease: Secondary | ICD-10-CM | POA: Diagnosis not present

## 2021-12-23 DIAGNOSIS — I4891 Unspecified atrial fibrillation: Secondary | ICD-10-CM | POA: Diagnosis not present

## 2021-12-23 DIAGNOSIS — M792 Neuralgia and neuritis, unspecified: Secondary | ICD-10-CM | POA: Diagnosis not present

## 2021-12-24 LAB — CULTURE, BLOOD (ROUTINE X 2)
Culture: NO GROWTH
Culture: NO GROWTH
Special Requests: ADEQUATE
Special Requests: ADEQUATE

## 2021-12-29 DIAGNOSIS — I5043 Acute on chronic combined systolic (congestive) and diastolic (congestive) heart failure: Secondary | ICD-10-CM | POA: Diagnosis not present

## 2021-12-29 DIAGNOSIS — G9341 Metabolic encephalopathy: Secondary | ICD-10-CM | POA: Diagnosis not present

## 2021-12-29 DIAGNOSIS — J9601 Acute respiratory failure with hypoxia: Secondary | ICD-10-CM | POA: Diagnosis not present

## 2021-12-29 DIAGNOSIS — N186 End stage renal disease: Secondary | ICD-10-CM | POA: Diagnosis not present

## 2021-12-29 DIAGNOSIS — D696 Thrombocytopenia, unspecified: Secondary | ICD-10-CM | POA: Diagnosis not present

## 2021-12-29 DIAGNOSIS — J9602 Acute respiratory failure with hypercapnia: Secondary | ICD-10-CM | POA: Diagnosis not present

## 2021-12-29 DIAGNOSIS — Z992 Dependence on renal dialysis: Secondary | ICD-10-CM | POA: Diagnosis not present

## 2021-12-29 DIAGNOSIS — I4891 Unspecified atrial fibrillation: Secondary | ICD-10-CM | POA: Diagnosis not present

## 2022-01-03 DIAGNOSIS — Z79899 Other long term (current) drug therapy: Secondary | ICD-10-CM | POA: Diagnosis not present

## 2022-01-03 DIAGNOSIS — I4891 Unspecified atrial fibrillation: Secondary | ICD-10-CM | POA: Diagnosis not present

## 2022-01-03 DIAGNOSIS — K5900 Constipation, unspecified: Secondary | ICD-10-CM | POA: Diagnosis not present

## 2022-01-03 DIAGNOSIS — J9610 Chronic respiratory failure, unspecified whether with hypoxia or hypercapnia: Secondary | ICD-10-CM | POA: Diagnosis not present

## 2022-01-03 DIAGNOSIS — Z760 Encounter for issue of repeat prescription: Secondary | ICD-10-CM | POA: Diagnosis not present

## 2022-01-03 DIAGNOSIS — G2581 Restless legs syndrome: Secondary | ICD-10-CM | POA: Diagnosis not present

## 2022-01-03 DIAGNOSIS — E785 Hyperlipidemia, unspecified: Secondary | ICD-10-CM | POA: Diagnosis not present

## 2022-01-03 DIAGNOSIS — G8929 Other chronic pain: Secondary | ICD-10-CM | POA: Diagnosis not present

## 2022-01-04 DIAGNOSIS — N186 End stage renal disease: Secondary | ICD-10-CM | POA: Diagnosis not present

## 2022-01-04 DIAGNOSIS — Z992 Dependence on renal dialysis: Secondary | ICD-10-CM | POA: Diagnosis not present

## 2022-01-10 DIAGNOSIS — I517 Cardiomegaly: Secondary | ICD-10-CM | POA: Diagnosis not present

## 2022-01-10 DIAGNOSIS — J811 Chronic pulmonary edema: Secondary | ICD-10-CM | POA: Diagnosis not present

## 2022-01-10 DIAGNOSIS — J90 Pleural effusion, not elsewhere classified: Secondary | ICD-10-CM | POA: Diagnosis not present

## 2022-02-04 DEATH — deceased

## 2022-06-20 IMAGING — CR DG SACRUM/COCCYX 2+V
1 series · 3 of 3 positions shown · non-contrast
Comparison: None.

CLINICAL DATA: Sacral wounds.

EXAM:
SACRUM AND COCCYX - 2+ VIEW

[Series 1: dg sacrum/coccyx · 0.14mm/px · 3 of 3 slices shown]
[im 1/3]
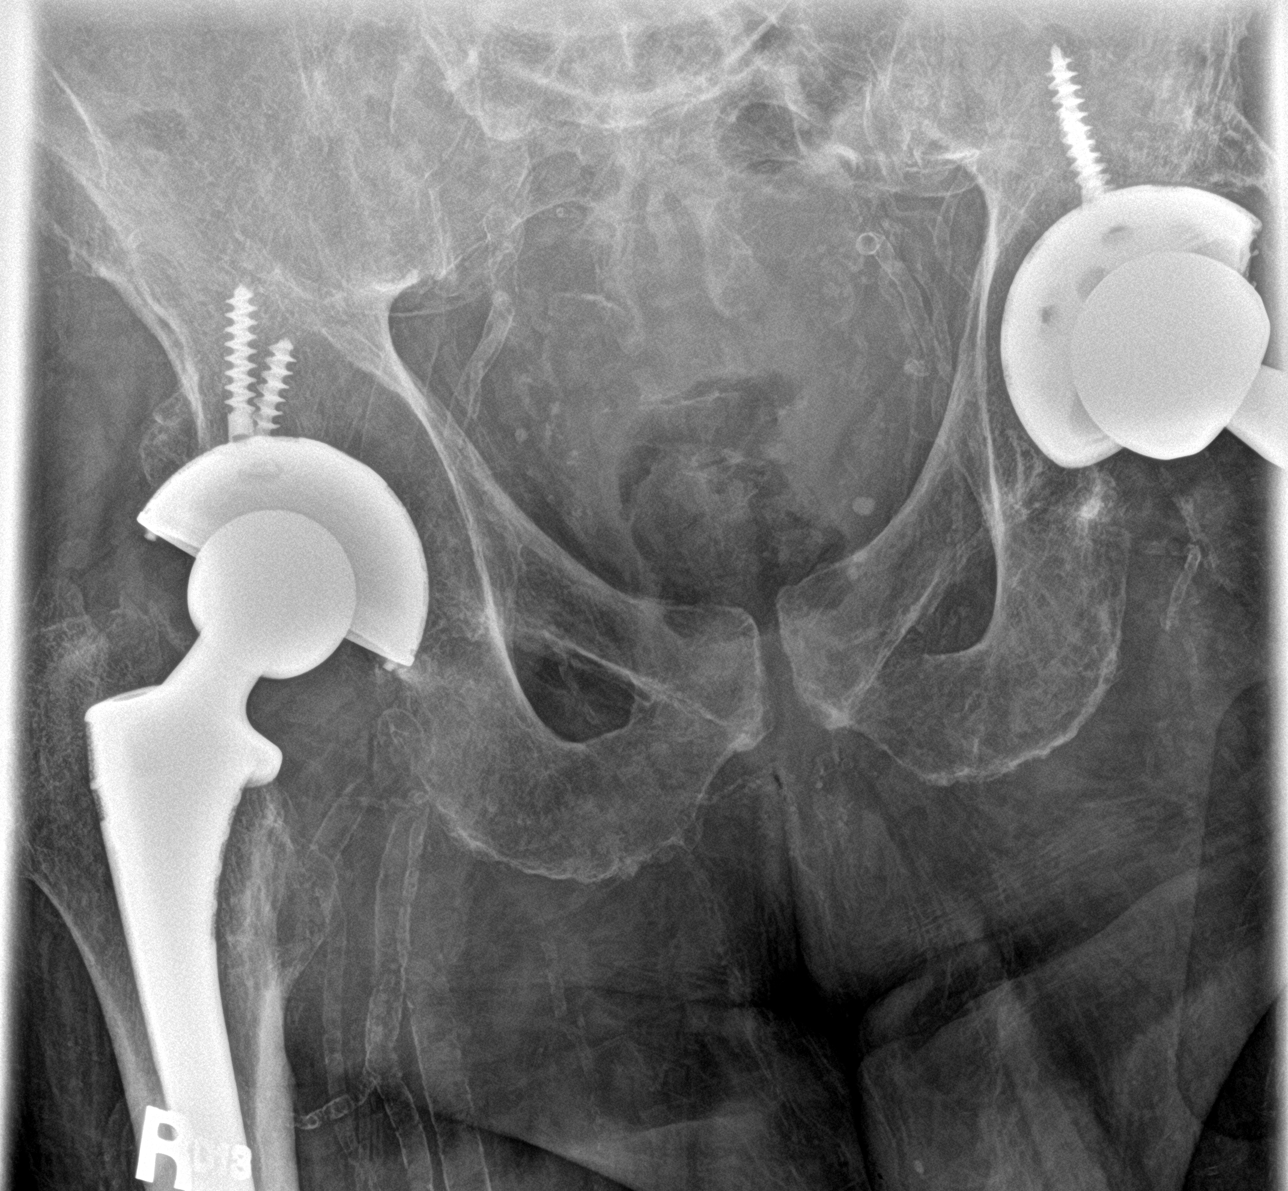
[im 2/3]
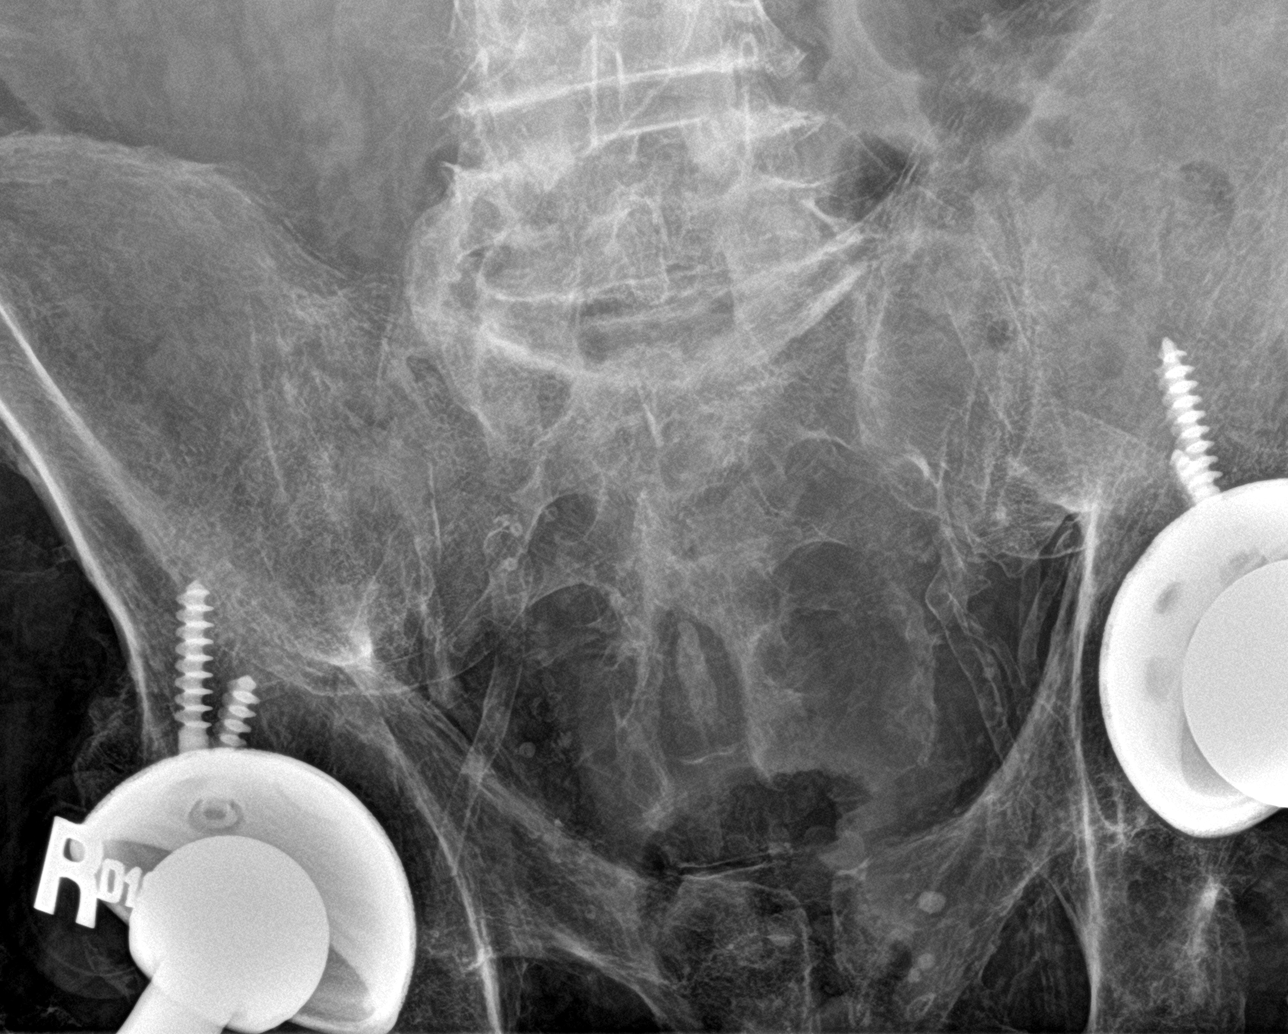
[im 3/3]
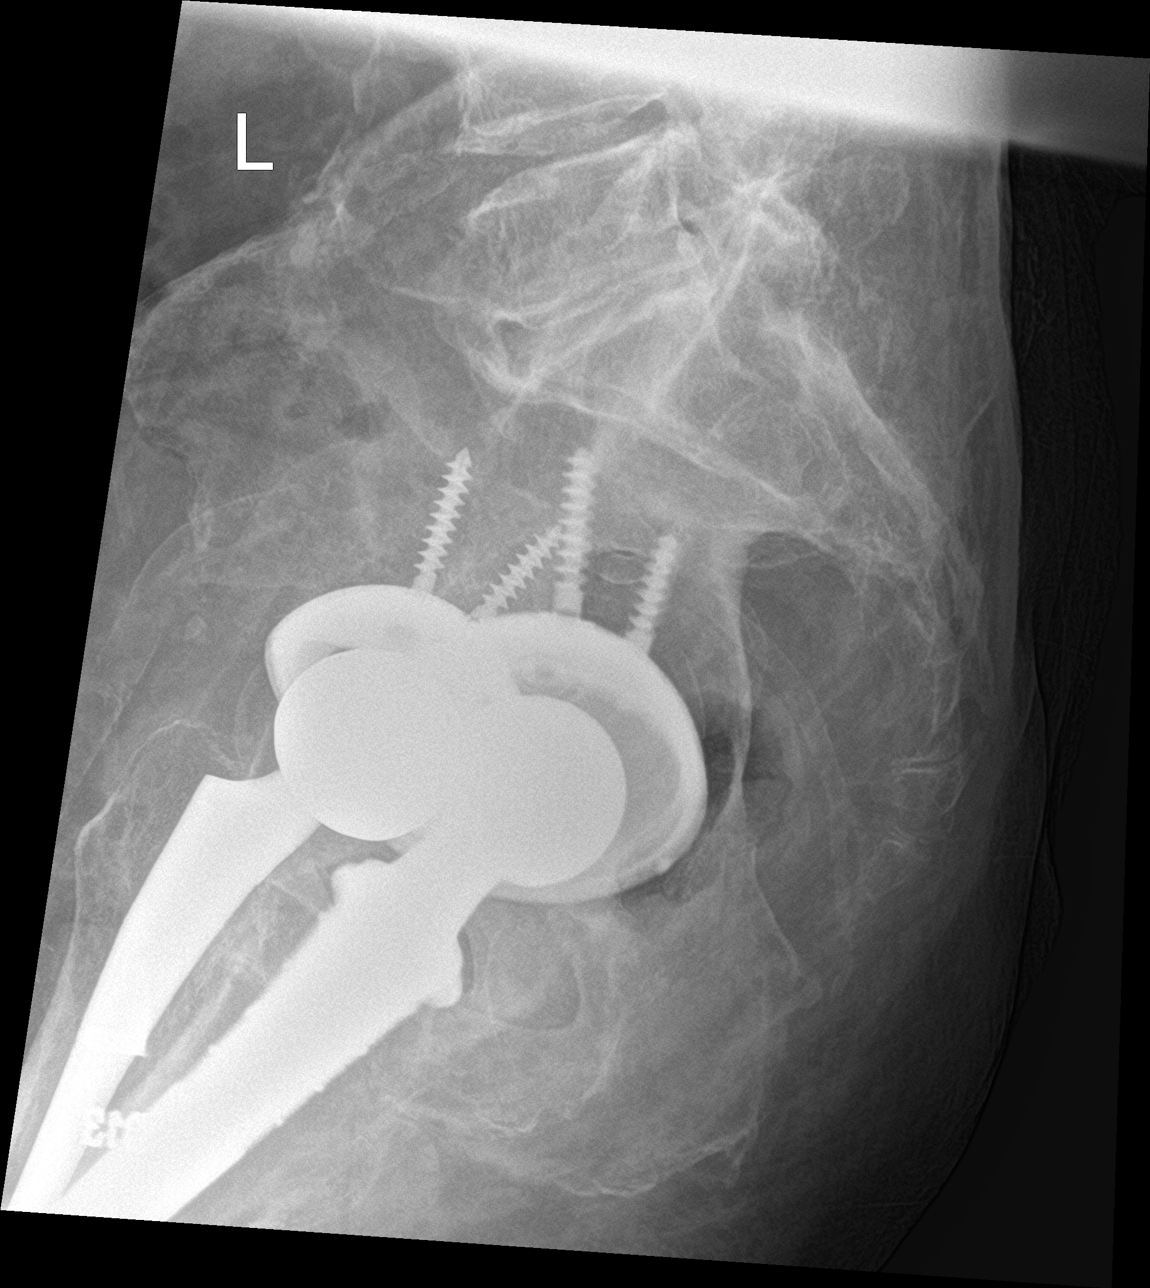

[3 of 3 positions shown; findings below may reference images not displayed]

FINDINGS: Bones are diffusely demineralized which limits assessment for
osteomyelitis. Patient is status post bilateral hip replacement. No
gross sacral destruction evident on this limited study. Symphysis
pubis unremarkable.
IMPRESSION: 1. Limited study due to bony demineralization. No gross sacral
destruction evident on this limited study.
2. Status post bilateral hip replacement.

## 2023-11-11 IMAGING — DX DG CHEST 1V PORT
1 series · 1 of 1 positions shown · non-contrast
Comparison: 02/20/2021

CLINICAL DATA: Dyspnea

EXAM:
PORTABLE CHEST 1 VIEW

[chest ap]
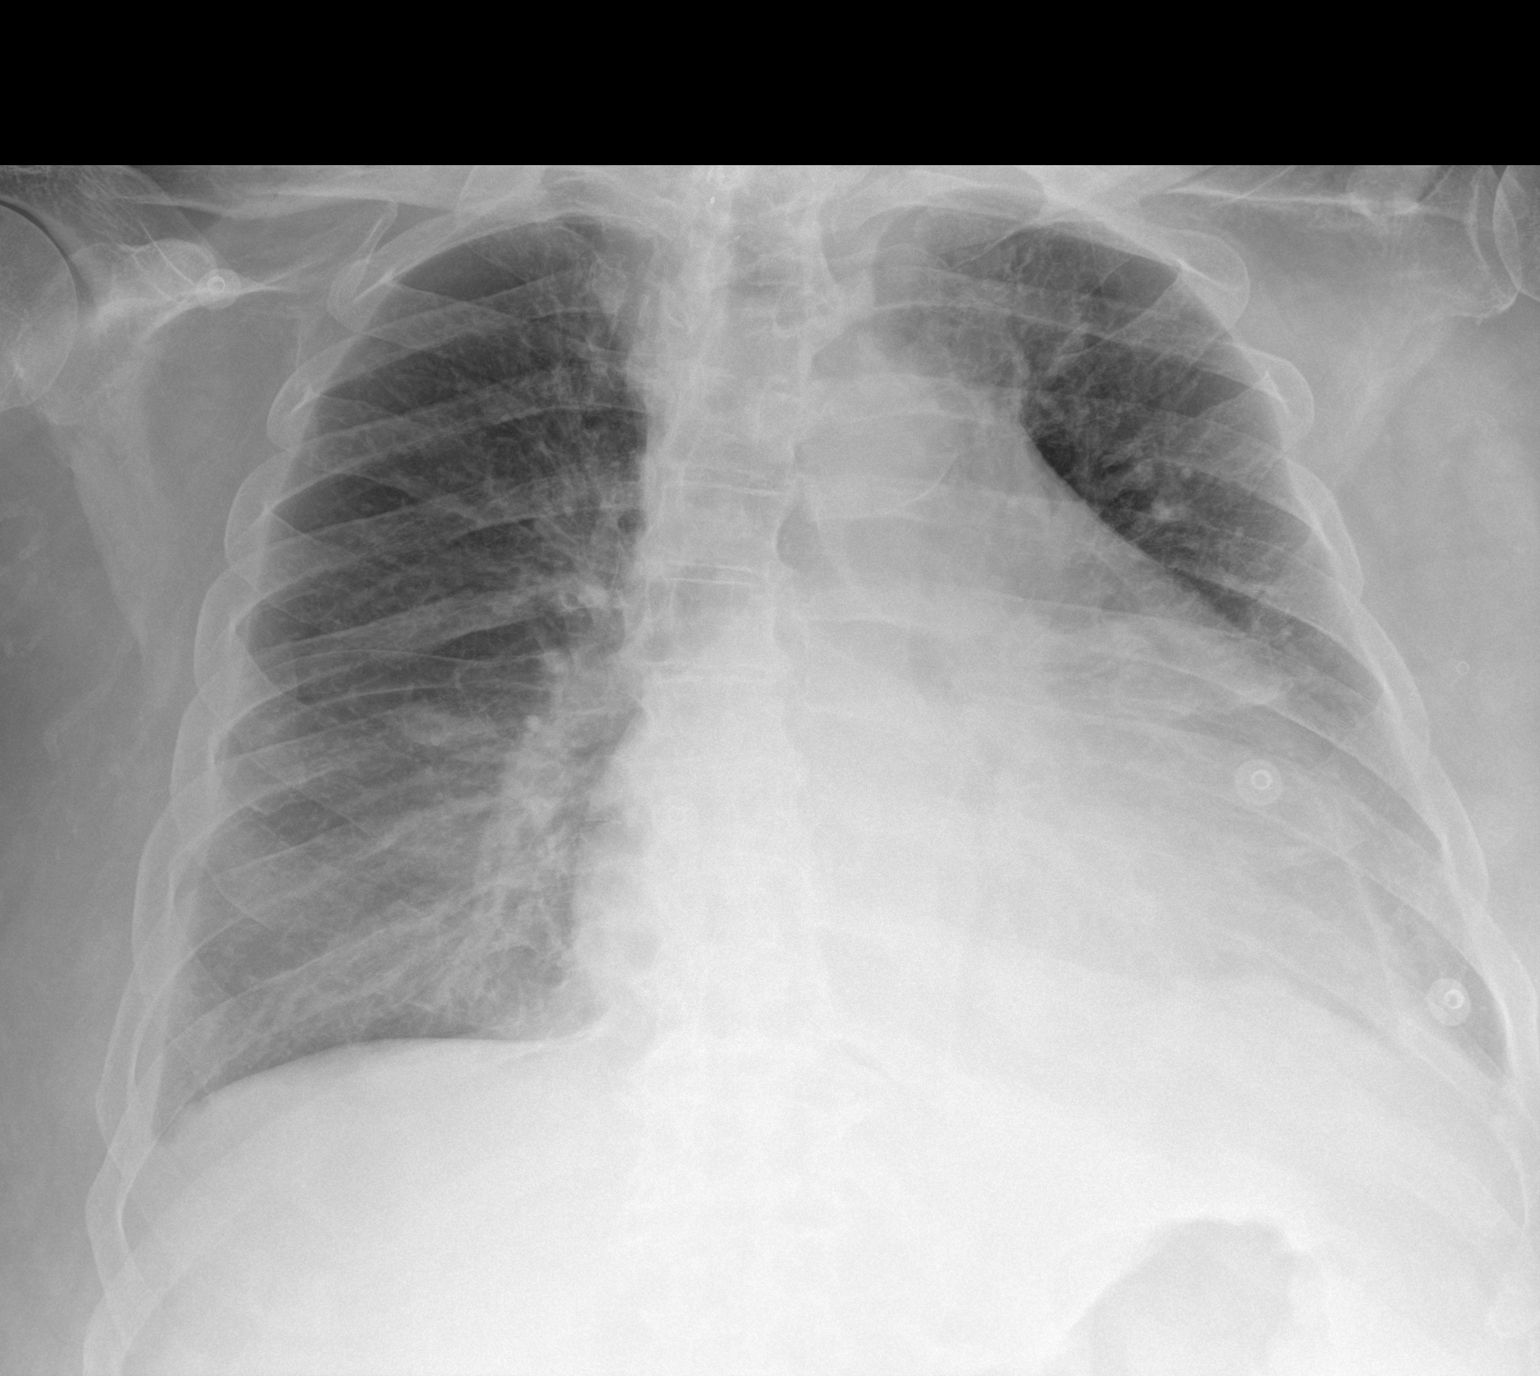

[1 of 1 positions shown; findings below may reference images not displayed]

FINDINGS: Lungs are symmetrically expanded. Stable cardiomegaly. Mild
perihilar interstitial pulmonary infiltrate is present in keeping
with mild interstitial pulmonary edema, likely cardiogenic in
nature. Trace bilateral pleural effusions are present. No
pneumothorax. No acute bone abnormality.
IMPRESSION: Stable cardiomegaly. Interval development of mild cardiogenic
failure
